# Patient Record
Sex: Female | Born: 1955
Health system: Southern US, Community
[De-identification: ages and names within clinical notes are randomized; demographics above are authoritative.]

## PROBLEM LIST (undated history)

## (undated) DIAGNOSIS — G8929 Other chronic pain: Secondary | ICD-10-CM

## (undated) DIAGNOSIS — E785 Hyperlipidemia, unspecified: Secondary | ICD-10-CM

## (undated) DIAGNOSIS — Z8632 Personal history of gestational diabetes: Secondary | ICD-10-CM

## (undated) DIAGNOSIS — I10 Essential (primary) hypertension: Secondary | ICD-10-CM

## (undated) DIAGNOSIS — M79606 Pain in leg, unspecified: Secondary | ICD-10-CM

## (undated) DIAGNOSIS — M5430 Sciatica, unspecified side: Secondary | ICD-10-CM

## (undated) DIAGNOSIS — F32A Depression, unspecified: Secondary | ICD-10-CM

## (undated) DIAGNOSIS — M109 Gout, unspecified: Secondary | ICD-10-CM

## (undated) DIAGNOSIS — G4733 Obstructive sleep apnea (adult) (pediatric): Secondary | ICD-10-CM

## (undated) DIAGNOSIS — M1990 Unspecified osteoarthritis, unspecified site: Secondary | ICD-10-CM

## (undated) DIAGNOSIS — R51 Headache: Secondary | ICD-10-CM

## (undated) DIAGNOSIS — H409 Unspecified glaucoma: Secondary | ICD-10-CM

## (undated) DIAGNOSIS — M549 Dorsalgia, unspecified: Secondary | ICD-10-CM

## (undated) DIAGNOSIS — R519 Headache, unspecified: Secondary | ICD-10-CM

## (undated) DIAGNOSIS — F419 Anxiety disorder, unspecified: Secondary | ICD-10-CM

## (undated) DIAGNOSIS — S86019A Strain of unspecified Achilles tendon, initial encounter: Secondary | ICD-10-CM

## (undated) DIAGNOSIS — F329 Major depressive disorder, single episode, unspecified: Secondary | ICD-10-CM

## (undated) HISTORY — PX: BACK SURGERY: SHX140

## (undated) HISTORY — DX: Obstructive sleep apnea (adult) (pediatric): G47.33

## (undated) HISTORY — DX: Depression, unspecified: F32.A

## (undated) HISTORY — DX: Hyperlipidemia, unspecified: E78.5

## (undated) HISTORY — PX: TUBAL LIGATION: SHX77

## (undated) HISTORY — DX: Anxiety disorder, unspecified: F41.9

## (undated) HISTORY — PX: CERVICAL SPINE SURGERY: SHX589

## (undated) HISTORY — PX: BREAST EXCISIONAL BIOPSY: SUR124

## (undated) HISTORY — DX: Major depressive disorder, single episode, unspecified: F32.9

## (undated) HISTORY — DX: Essential (primary) hypertension: I10

## (undated) HISTORY — PX: LUMBAR SPINE SURGERY: SHX701

## (undated) HISTORY — DX: Unspecified glaucoma: H40.9

## (undated) HISTORY — DX: Unspecified osteoarthritis, unspecified site: M19.90

## (undated) HISTORY — DX: Personal history of gestational diabetes: Z86.32

---

## 1989-09-18 HISTORY — PX: CHOLECYSTECTOMY: SHX55

## 2002-11-28 ENCOUNTER — Encounter: Payer: Self-pay | Admitting: Neurosurgery

## 2002-11-28 ENCOUNTER — Inpatient Hospital Stay (HOSPITAL_COMMUNITY): Admission: EM | Admit: 2002-11-28 | Discharge: 2002-12-02 | Payer: Self-pay | Admitting: Emergency Medicine

## 2002-12-01 ENCOUNTER — Encounter: Payer: Self-pay | Admitting: Neurosurgery

## 2003-01-01 ENCOUNTER — Encounter: Admission: RE | Admit: 2003-01-01 | Discharge: 2003-04-01 | Payer: Self-pay | Admitting: Neurosurgery

## 2004-02-18 ENCOUNTER — Other Ambulatory Visit: Payer: Self-pay

## 2005-01-12 ENCOUNTER — Encounter (INDEPENDENT_AMBULATORY_CARE_PROVIDER_SITE_OTHER): Payer: Self-pay | Admitting: Internal Medicine

## 2005-01-31 ENCOUNTER — Ambulatory Visit: Payer: Self-pay

## 2005-03-09 ENCOUNTER — Ambulatory Visit: Payer: Self-pay

## 2005-06-15 ENCOUNTER — Other Ambulatory Visit: Payer: Self-pay

## 2005-06-16 ENCOUNTER — Inpatient Hospital Stay: Payer: Self-pay | Admitting: Family Medicine

## 2005-09-15 ENCOUNTER — Ambulatory Visit: Payer: Self-pay | Admitting: Family Medicine

## 2007-01-22 ENCOUNTER — Emergency Department: Payer: Self-pay | Admitting: Emergency Medicine

## 2007-01-22 ENCOUNTER — Other Ambulatory Visit: Payer: Self-pay

## 2007-03-07 ENCOUNTER — Ambulatory Visit: Payer: Self-pay | Admitting: Internal Medicine

## 2007-03-26 ENCOUNTER — Encounter (INDEPENDENT_AMBULATORY_CARE_PROVIDER_SITE_OTHER): Payer: Self-pay | Admitting: Internal Medicine

## 2007-03-26 ENCOUNTER — Ambulatory Visit: Payer: Self-pay | Admitting: Internal Medicine

## 2007-11-14 ENCOUNTER — Emergency Department: Payer: Self-pay | Admitting: Emergency Medicine

## 2008-03-11 ENCOUNTER — Ambulatory Visit: Payer: Self-pay | Admitting: Internal Medicine

## 2008-07-16 ENCOUNTER — Ambulatory Visit: Payer: Self-pay | Admitting: Gastroenterology

## 2008-07-16 DIAGNOSIS — K5730 Diverticulosis of large intestine without perforation or abscess without bleeding: Secondary | ICD-10-CM | POA: Insufficient documentation

## 2008-07-16 DIAGNOSIS — K648 Other hemorrhoids: Secondary | ICD-10-CM | POA: Insufficient documentation

## 2008-09-17 ENCOUNTER — Ambulatory Visit: Payer: Self-pay | Admitting: Internal Medicine

## 2008-09-29 ENCOUNTER — Ambulatory Visit: Payer: Self-pay | Admitting: Cardiology

## 2008-09-29 ENCOUNTER — Inpatient Hospital Stay (HOSPITAL_COMMUNITY): Admission: EM | Admit: 2008-09-29 | Discharge: 2008-10-01 | Payer: Self-pay | Admitting: Emergency Medicine

## 2008-09-30 ENCOUNTER — Encounter (INDEPENDENT_AMBULATORY_CARE_PROVIDER_SITE_OTHER): Payer: Self-pay | Admitting: *Deleted

## 2008-10-09 ENCOUNTER — Ambulatory Visit: Payer: Self-pay | Admitting: Internal Medicine

## 2008-12-10 ENCOUNTER — Encounter: Admission: RE | Admit: 2008-12-10 | Discharge: 2009-03-10 | Payer: Self-pay | Admitting: Internal Medicine

## 2009-04-20 ENCOUNTER — Emergency Department (HOSPITAL_COMMUNITY): Admission: EM | Admit: 2009-04-20 | Discharge: 2009-04-20 | Payer: Self-pay | Admitting: Emergency Medicine

## 2009-04-28 ENCOUNTER — Emergency Department (HOSPITAL_COMMUNITY): Admission: EM | Admit: 2009-04-28 | Discharge: 2009-04-28 | Payer: Self-pay | Admitting: Family Medicine

## 2009-04-30 ENCOUNTER — Emergency Department (HOSPITAL_COMMUNITY): Admission: EM | Admit: 2009-04-30 | Discharge: 2009-04-30 | Payer: Self-pay | Admitting: Family Medicine

## 2009-05-13 ENCOUNTER — Encounter (INDEPENDENT_AMBULATORY_CARE_PROVIDER_SITE_OTHER): Payer: Self-pay | Admitting: Internal Medicine

## 2009-06-10 ENCOUNTER — Telehealth (INDEPENDENT_AMBULATORY_CARE_PROVIDER_SITE_OTHER): Payer: Self-pay | Admitting: Internal Medicine

## 2009-06-17 ENCOUNTER — Encounter (INDEPENDENT_AMBULATORY_CARE_PROVIDER_SITE_OTHER): Payer: Self-pay | Admitting: Internal Medicine

## 2009-07-02 ENCOUNTER — Ambulatory Visit: Payer: Self-pay | Admitting: Internal Medicine

## 2009-07-02 DIAGNOSIS — M545 Low back pain: Secondary | ICD-10-CM | POA: Insufficient documentation

## 2009-07-02 DIAGNOSIS — I10 Essential (primary) hypertension: Secondary | ICD-10-CM | POA: Insufficient documentation

## 2009-07-02 DIAGNOSIS — H409 Unspecified glaucoma: Secondary | ICD-10-CM | POA: Insufficient documentation

## 2009-07-02 DIAGNOSIS — G4733 Obstructive sleep apnea (adult) (pediatric): Secondary | ICD-10-CM | POA: Insufficient documentation

## 2009-07-02 DIAGNOSIS — E119 Type 2 diabetes mellitus without complications: Secondary | ICD-10-CM

## 2009-07-02 DIAGNOSIS — E1122 Type 2 diabetes mellitus with diabetic chronic kidney disease: Secondary | ICD-10-CM | POA: Insufficient documentation

## 2009-07-02 DIAGNOSIS — Z6841 Body Mass Index (BMI) 40.0 and over, adult: Secondary | ICD-10-CM | POA: Insufficient documentation

## 2009-07-02 DIAGNOSIS — R109 Unspecified abdominal pain: Secondary | ICD-10-CM | POA: Insufficient documentation

## 2009-07-02 DIAGNOSIS — M549 Dorsalgia, unspecified: Secondary | ICD-10-CM | POA: Insufficient documentation

## 2009-07-02 DIAGNOSIS — R609 Edema, unspecified: Secondary | ICD-10-CM | POA: Insufficient documentation

## 2009-07-02 DIAGNOSIS — Z87891 Personal history of nicotine dependence: Secondary | ICD-10-CM | POA: Insufficient documentation

## 2009-07-02 DIAGNOSIS — J45909 Unspecified asthma, uncomplicated: Secondary | ICD-10-CM | POA: Insufficient documentation

## 2009-07-02 DIAGNOSIS — E785 Hyperlipidemia, unspecified: Secondary | ICD-10-CM | POA: Insufficient documentation

## 2009-07-02 HISTORY — DX: Obstructive sleep apnea (adult) (pediatric): G47.33

## 2009-07-02 LAB — CONVERTED CEMR LAB: Blood Glucose, Fingerstick: 331

## 2009-07-07 ENCOUNTER — Encounter (INDEPENDENT_AMBULATORY_CARE_PROVIDER_SITE_OTHER): Payer: Self-pay | Admitting: Internal Medicine

## 2009-07-09 ENCOUNTER — Encounter (INDEPENDENT_AMBULATORY_CARE_PROVIDER_SITE_OTHER): Payer: Self-pay | Admitting: Internal Medicine

## 2009-07-12 DIAGNOSIS — D509 Iron deficiency anemia, unspecified: Secondary | ICD-10-CM | POA: Insufficient documentation

## 2009-07-12 LAB — CONVERTED CEMR LAB
ALT: 9 units/L (ref 0–35)
AST: 10 units/L (ref 0–37)
Alkaline Phosphatase: 66 units/L (ref 39–117)
Basophils Absolute: 0 10*3/uL (ref 0.0–0.1)
Basophils Relative: 0 % (ref 0–1)
CO2: 23 meq/L (ref 19–32)
Creatinine, Ser: 1.04 mg/dL (ref 0.40–1.20)
Eosinophils Absolute: 0.2 10*3/uL (ref 0.0–0.7)
MCHC: 31.2 g/dL (ref 30.0–36.0)
MCV: 80.5 fL (ref 78.0–100.0)
Neutro Abs: 3.9 10*3/uL (ref 1.7–7.7)
Neutrophils Relative %: 58 % (ref 43–77)
Platelets: 183 10*3/uL (ref 150–400)
RDW: 16.2 % — ABNORMAL HIGH (ref 11.5–15.5)
Sodium: 140 meq/L (ref 135–145)
TSH: 3.017 microintl units/mL (ref 0.350–4.500)
Total Bilirubin: 0.3 mg/dL (ref 0.3–1.2)

## 2009-07-13 ENCOUNTER — Encounter (INDEPENDENT_AMBULATORY_CARE_PROVIDER_SITE_OTHER): Payer: Self-pay | Admitting: Internal Medicine

## 2009-07-15 ENCOUNTER — Telehealth (INDEPENDENT_AMBULATORY_CARE_PROVIDER_SITE_OTHER): Payer: Self-pay | Admitting: Internal Medicine

## 2009-07-26 ENCOUNTER — Encounter (INDEPENDENT_AMBULATORY_CARE_PROVIDER_SITE_OTHER): Payer: Self-pay | Admitting: Internal Medicine

## 2009-08-11 ENCOUNTER — Telehealth (INDEPENDENT_AMBULATORY_CARE_PROVIDER_SITE_OTHER): Payer: Self-pay | Admitting: Internal Medicine

## 2009-08-13 ENCOUNTER — Ambulatory Visit (HOSPITAL_BASED_OUTPATIENT_CLINIC_OR_DEPARTMENT_OTHER): Admission: RE | Admit: 2009-08-13 | Discharge: 2009-08-13 | Payer: Self-pay | Admitting: Internal Medicine

## 2009-08-13 ENCOUNTER — Emergency Department (HOSPITAL_COMMUNITY): Admission: EM | Admit: 2009-08-13 | Discharge: 2009-08-13 | Payer: Self-pay | Admitting: Emergency Medicine

## 2009-08-13 ENCOUNTER — Encounter (INDEPENDENT_AMBULATORY_CARE_PROVIDER_SITE_OTHER): Payer: Self-pay | Admitting: Internal Medicine

## 2009-08-17 ENCOUNTER — Encounter (INDEPENDENT_AMBULATORY_CARE_PROVIDER_SITE_OTHER): Payer: Self-pay | Admitting: Internal Medicine

## 2009-08-17 ENCOUNTER — Encounter: Admission: RE | Admit: 2009-08-17 | Discharge: 2009-09-15 | Payer: Self-pay | Admitting: Internal Medicine

## 2009-08-20 ENCOUNTER — Ambulatory Visit: Payer: Self-pay | Admitting: Internal Medicine

## 2009-08-20 LAB — CONVERTED CEMR LAB
Bilirubin Urine: NEGATIVE
Glucose, Urine, Semiquant: NEGATIVE
Hgb A1c MFr Bld: 10.2 %
Ketones, urine, test strip: NEGATIVE

## 2009-08-28 ENCOUNTER — Ambulatory Visit: Payer: Self-pay | Admitting: Internal Medicine

## 2009-08-31 ENCOUNTER — Encounter (INDEPENDENT_AMBULATORY_CARE_PROVIDER_SITE_OTHER): Payer: Self-pay | Admitting: Internal Medicine

## 2009-09-02 ENCOUNTER — Telehealth (INDEPENDENT_AMBULATORY_CARE_PROVIDER_SITE_OTHER): Payer: Self-pay | Admitting: Internal Medicine

## 2009-09-06 LAB — CONVERTED CEMR LAB
Iron: 36 ug/dL — ABNORMAL LOW (ref 42–145)
RBC Folate: 378 ng/mL (ref 180–600)
Saturation Ratios: 14 % — ABNORMAL LOW (ref 20–55)
Total CHOL/HDL Ratio: 2.5
UIBC: 213 ug/dL
VLDL: 22 mg/dL (ref 0–40)
Vitamin B-12: 644 pg/mL (ref 211–911)

## 2009-09-13 ENCOUNTER — Encounter (INDEPENDENT_AMBULATORY_CARE_PROVIDER_SITE_OTHER): Payer: Self-pay | Admitting: *Deleted

## 2009-09-16 ENCOUNTER — Telehealth (INDEPENDENT_AMBULATORY_CARE_PROVIDER_SITE_OTHER): Payer: Self-pay | Admitting: Internal Medicine

## 2009-10-06 ENCOUNTER — Ambulatory Visit (HOSPITAL_COMMUNITY): Admission: RE | Admit: 2009-10-06 | Discharge: 2009-10-06 | Payer: Self-pay | Admitting: Internal Medicine

## 2009-10-08 ENCOUNTER — Ambulatory Visit: Payer: Self-pay | Admitting: Internal Medicine

## 2009-10-08 DIAGNOSIS — N644 Mastodynia: Secondary | ICD-10-CM | POA: Insufficient documentation

## 2009-10-28 ENCOUNTER — Telehealth (INDEPENDENT_AMBULATORY_CARE_PROVIDER_SITE_OTHER): Payer: Self-pay | Admitting: Internal Medicine

## 2009-11-08 ENCOUNTER — Encounter (INDEPENDENT_AMBULATORY_CARE_PROVIDER_SITE_OTHER): Payer: Self-pay | Admitting: Internal Medicine

## 2009-11-08 ENCOUNTER — Ambulatory Visit (HOSPITAL_BASED_OUTPATIENT_CLINIC_OR_DEPARTMENT_OTHER): Admission: RE | Admit: 2009-11-08 | Discharge: 2009-11-08 | Payer: Self-pay | Admitting: Internal Medicine

## 2009-11-14 ENCOUNTER — Ambulatory Visit: Payer: Self-pay | Admitting: Internal Medicine

## 2009-12-04 ENCOUNTER — Telehealth (INDEPENDENT_AMBULATORY_CARE_PROVIDER_SITE_OTHER): Payer: Self-pay | Admitting: Internal Medicine

## 2009-12-07 ENCOUNTER — Encounter (INDEPENDENT_AMBULATORY_CARE_PROVIDER_SITE_OTHER): Payer: Self-pay | Admitting: Internal Medicine

## 2009-12-13 ENCOUNTER — Encounter (INDEPENDENT_AMBULATORY_CARE_PROVIDER_SITE_OTHER): Payer: Self-pay | Admitting: Internal Medicine

## 2009-12-24 ENCOUNTER — Ambulatory Visit: Payer: Self-pay | Admitting: Internal Medicine

## 2010-01-31 ENCOUNTER — Encounter (INDEPENDENT_AMBULATORY_CARE_PROVIDER_SITE_OTHER): Payer: Self-pay | Admitting: Internal Medicine

## 2010-01-31 ENCOUNTER — Encounter: Admission: RE | Admit: 2010-01-31 | Discharge: 2010-01-31 | Payer: Self-pay | Admitting: Internal Medicine

## 2010-02-03 ENCOUNTER — Encounter (INDEPENDENT_AMBULATORY_CARE_PROVIDER_SITE_OTHER): Payer: Self-pay | Admitting: Internal Medicine

## 2010-03-24 ENCOUNTER — Telehealth (INDEPENDENT_AMBULATORY_CARE_PROVIDER_SITE_OTHER): Payer: Self-pay | Admitting: Internal Medicine

## 2010-03-26 ENCOUNTER — Encounter (INDEPENDENT_AMBULATORY_CARE_PROVIDER_SITE_OTHER): Payer: Self-pay | Admitting: Internal Medicine

## 2010-03-26 DIAGNOSIS — J449 Chronic obstructive pulmonary disease, unspecified: Secondary | ICD-10-CM | POA: Insufficient documentation

## 2010-03-26 DIAGNOSIS — J4489 Other specified chronic obstructive pulmonary disease: Secondary | ICD-10-CM | POA: Insufficient documentation

## 2010-03-26 HISTORY — DX: Chronic obstructive pulmonary disease, unspecified: J44.9

## 2010-03-26 HISTORY — DX: Other specified chronic obstructive pulmonary disease: J44.89

## 2010-04-04 ENCOUNTER — Encounter (INDEPENDENT_AMBULATORY_CARE_PROVIDER_SITE_OTHER): Payer: Self-pay | Admitting: Internal Medicine

## 2010-04-26 ENCOUNTER — Ambulatory Visit: Payer: Self-pay | Admitting: Internal Medicine

## 2010-04-26 ENCOUNTER — Telehealth (INDEPENDENT_AMBULATORY_CARE_PROVIDER_SITE_OTHER): Payer: Self-pay | Admitting: Internal Medicine

## 2010-04-26 ENCOUNTER — Other Ambulatory Visit: Admission: RE | Admit: 2010-04-26 | Discharge: 2010-04-26 | Payer: Self-pay | Admitting: Internal Medicine

## 2010-04-26 DIAGNOSIS — F329 Major depressive disorder, single episode, unspecified: Secondary | ICD-10-CM | POA: Insufficient documentation

## 2010-04-26 DIAGNOSIS — R079 Chest pain, unspecified: Secondary | ICD-10-CM | POA: Insufficient documentation

## 2010-04-26 DIAGNOSIS — M5030 Other cervical disc degeneration, unspecified cervical region: Secondary | ICD-10-CM | POA: Insufficient documentation

## 2010-04-26 DIAGNOSIS — M26609 Unspecified temporomandibular joint disorder, unspecified side: Secondary | ICD-10-CM | POA: Insufficient documentation

## 2010-05-02 ENCOUNTER — Ambulatory Visit: Payer: Self-pay | Admitting: Internal Medicine

## 2010-05-03 ENCOUNTER — Encounter (INDEPENDENT_AMBULATORY_CARE_PROVIDER_SITE_OTHER): Payer: Self-pay | Admitting: Internal Medicine

## 2010-05-05 ENCOUNTER — Encounter (INDEPENDENT_AMBULATORY_CARE_PROVIDER_SITE_OTHER): Payer: Self-pay | Admitting: Internal Medicine

## 2010-05-05 ENCOUNTER — Telehealth (INDEPENDENT_AMBULATORY_CARE_PROVIDER_SITE_OTHER): Payer: Self-pay | Admitting: Internal Medicine

## 2010-05-05 DIAGNOSIS — D649 Anemia, unspecified: Secondary | ICD-10-CM | POA: Insufficient documentation

## 2010-05-05 LAB — CONVERTED CEMR LAB
Albumin: 3.9 g/dL (ref 3.5–5.2)
Alkaline Phosphatase: 60 units/L (ref 39–117)
BUN: 15 mg/dL (ref 6–23)
Creatinine, Ser: 0.98 mg/dL (ref 0.40–1.20)
Eosinophils Absolute: 0.2 10*3/uL (ref 0.0–0.7)
Eosinophils Relative: 3 % (ref 0–5)
Glucose, Bld: 139 mg/dL — ABNORMAL HIGH (ref 70–99)
HCT: 37.3 % (ref 36.0–46.0)
HDL: 59 mg/dL (ref >39)
LDL Cholesterol: 120 mg/dL — ABNORMAL HIGH (ref 0–99)
Lymphs Abs: 1.9 10*3/uL (ref 0.7–4.0)
MCV: 84.8 fL (ref 78.0–100.0)
Monocytes Absolute: 0.4 10*3/uL (ref 0.1–1.0)
Monocytes Relative: 8 % (ref 3–12)
RBC: 4.4 M/uL (ref 3.87–5.11)
Total Bilirubin: 0.3 mg/dL (ref 0.3–1.2)
Triglycerides: 88 mg/dL (ref <150)
VLDL: 18 mg/dL (ref 0–40)
WBC: 5.6 10*3/uL (ref 4.0–10.5)

## 2010-05-09 ENCOUNTER — Telehealth (INDEPENDENT_AMBULATORY_CARE_PROVIDER_SITE_OTHER): Payer: Self-pay | Admitting: *Deleted

## 2010-05-09 ENCOUNTER — Ambulatory Visit: Payer: Self-pay | Admitting: Internal Medicine

## 2010-05-09 DIAGNOSIS — N950 Postmenopausal bleeding: Secondary | ICD-10-CM | POA: Insufficient documentation

## 2010-05-09 LAB — CONVERTED CEMR LAB
Iron: 24 ug/dL — ABNORMAL LOW (ref 42–145)
Saturation Ratios: 8 % — ABNORMAL LOW (ref 20–55)
Vitamin B-12: 592 pg/mL (ref 211–911)

## 2010-05-11 ENCOUNTER — Encounter (INDEPENDENT_AMBULATORY_CARE_PROVIDER_SITE_OTHER): Payer: Self-pay | Admitting: Internal Medicine

## 2010-05-13 ENCOUNTER — Ambulatory Visit: Payer: Self-pay | Admitting: Internal Medicine

## 2010-05-13 ENCOUNTER — Ambulatory Visit (HOSPITAL_COMMUNITY): Admission: RE | Admit: 2010-05-13 | Discharge: 2010-05-13 | Payer: Self-pay | Admitting: Internal Medicine

## 2010-05-16 ENCOUNTER — Ambulatory Visit: Payer: Self-pay | Admitting: Internal Medicine

## 2010-05-24 ENCOUNTER — Encounter (INDEPENDENT_AMBULATORY_CARE_PROVIDER_SITE_OTHER): Payer: Self-pay | Admitting: Internal Medicine

## 2010-06-09 ENCOUNTER — Emergency Department (HOSPITAL_COMMUNITY): Admission: EM | Admit: 2010-06-09 | Discharge: 2010-06-09 | Payer: Self-pay | Admitting: Emergency Medicine

## 2010-06-17 ENCOUNTER — Encounter (INDEPENDENT_AMBULATORY_CARE_PROVIDER_SITE_OTHER): Payer: Self-pay | Admitting: Internal Medicine

## 2010-06-20 ENCOUNTER — Encounter (INDEPENDENT_AMBULATORY_CARE_PROVIDER_SITE_OTHER): Payer: Self-pay | Admitting: Internal Medicine

## 2010-06-20 ENCOUNTER — Encounter
Admission: RE | Admit: 2010-06-20 | Discharge: 2010-09-18 | Payer: Self-pay | Source: Home / Self Care | Attending: Internal Medicine | Admitting: Internal Medicine

## 2010-06-21 ENCOUNTER — Telehealth (INDEPENDENT_AMBULATORY_CARE_PROVIDER_SITE_OTHER): Payer: Self-pay | Admitting: Internal Medicine

## 2010-06-21 ENCOUNTER — Encounter (INDEPENDENT_AMBULATORY_CARE_PROVIDER_SITE_OTHER): Payer: Self-pay | Admitting: Internal Medicine

## 2010-06-23 ENCOUNTER — Ambulatory Visit: Payer: Self-pay | Admitting: Obstetrics and Gynecology

## 2010-06-23 ENCOUNTER — Other Ambulatory Visit: Admission: RE | Admit: 2010-06-23 | Discharge: 2010-06-23 | Payer: Self-pay | Admitting: Obstetrics and Gynecology

## 2010-07-14 ENCOUNTER — Ambulatory Visit: Payer: Self-pay | Admitting: Obstetrics & Gynecology

## 2010-07-29 ENCOUNTER — Encounter (INDEPENDENT_AMBULATORY_CARE_PROVIDER_SITE_OTHER): Payer: Self-pay | Admitting: Internal Medicine

## 2010-08-26 ENCOUNTER — Ambulatory Visit: Payer: Self-pay | Admitting: Internal Medicine

## 2010-08-30 ENCOUNTER — Ambulatory Visit (HOSPITAL_COMMUNITY)
Admission: RE | Admit: 2010-08-30 | Discharge: 2010-08-30 | Payer: Self-pay | Source: Home / Self Care | Attending: Obstetrics and Gynecology | Admitting: Obstetrics and Gynecology

## 2010-10-06 ENCOUNTER — Ambulatory Visit
Admission: RE | Admit: 2010-10-06 | Discharge: 2010-10-06 | Payer: Self-pay | Source: Home / Self Care | Attending: Obstetrics and Gynecology | Admitting: Obstetrics and Gynecology

## 2010-10-10 ENCOUNTER — Ambulatory Visit (HOSPITAL_COMMUNITY): Admission: RE | Admit: 2010-10-10 | Payer: Self-pay | Source: Home / Self Care | Admitting: Internal Medicine

## 2010-10-18 NOTE — Progress Notes (Signed)
Summary: Office Visit//DEPRESSION SCREENING  Office Visit//DEPRESSION SCREENING   Imported By: Arta Bruce 05/04/2010 15:37:08  _____________________________________________________________________  External Attachment:    Type:   Image     Comment:   External Document

## 2010-10-18 NOTE — Letter (Signed)
Summary: NUTRITION & DIABETES  NUTRITION & DIABETES   Imported By: Arta Bruce 04/12/2010 12:27:50  _____________________________________________________________________  External Attachment:    Type:   Image     Comment:   External Document

## 2010-10-18 NOTE — Letter (Signed)
Summary: NUTRITION & DIABETES/PROGRESS NOTE  NUTRITION & DIABETES/PROGRESS NOTE   Imported By: Arta Bruce 02/10/2010 12:07:48  _____________________________________________________________________  External Attachment:    Type:   Image     Comment:   External Document

## 2010-10-18 NOTE — Letter (Signed)
Summary: REFERRAL//NUTRITION  REFERRAL//NUTRITION   Imported By: Arta Bruce 11/03/2009 14:56:50  _____________________________________________________________________  External Attachment:    Type:   Image     Comment:   External Document

## 2010-10-18 NOTE — Progress Notes (Signed)
  Phone Note Outgoing Call   Summary of Call: Rec'd note from [facility] for need to change Lumigan eye gtts from 0.03% to 0.01% due to supply. Please check to see if patient has an ophthalmologist.  If so, ask her eye doctor to recommend a substitute or if 0.01% is ok. Will leave refill paper on Dr. Renne Crigler desk.  Initial call taken by: Tereso Newcomer PA-C,  March 24, 2010 1:25 PM  Follow-up for Phone Call        Not sure why she was on the higher dose--literature suggests that is for eyelash growth--okay to fill the lower percentage medication--please let the pt. know and see if there is an eye doctor she goes to that we can notify. Follow-up by: Julieanne Manson MD,  March 25, 2010 9:08 AM  Additional Follow-up for Phone Call Additional follow up Details #1::        Pt aware and she goes to Dr. Dione Booze Additional Follow-up by: Vesta Mixer CMA,  March 30, 2010 11:15 AM    Additional Follow-up for Phone Call Additional follow up Details #2::    Please call his office and find out what dose he wants her on--let them know we do not have the higher strength Follow-up by: Julieanne Manson MD,  April 01, 2010 6:15 PM  Additional Follow-up for Phone Call Additional follow up Details #3:: Details for Additional Follow-up Action Taken: Dr. Lucious Groves office aware and states that is fine. Additional Follow-up by: Vesta Mixer CMA,  April 06, 2010 10:35 AM  New/Updated Medications: LUMIGAN 0.01 % SOLN (BIMATOPROST) 1 drop each eye at bedtime Prescriptions: LUMIGAN 0.01 % SOLN (BIMATOPROST) 1 drop each eye at bedtime  #1 month x 11   Entered and Authorized by:   Julieanne Manson MD   Signed by:   Julieanne Manson MD on 03/25/2010   Method used:   Re-Faxed to ...       [facility] (retail)       [address] [place], [place]  [address]       Ph: [phone]       Fax: [phone]   RxID:   [id number] LUMIGAN 0.01 % SOLN (BIMATOPROST) 1 drop each eye  at bedtime  #1 month x 11   Entered and Authorized by:   Julieanne Manson MD   Signed by:   Julieanne Manson MD on 03/25/2010   Method used:   Electronically to        [facility] [address] (retail)       [address]       [place], [place]  [address]       Ph: [phone] or [phone]       Fax: [phone]   RxID:   [id number]  Needed to go to [facility]

## 2010-10-18 NOTE — Letter (Signed)
Summary: RECORDS RECEIVED FROM [facility]  RECORDS RECEIVED FROM [facility]   Imported By: Arta Bruce 04/12/2010 12:37:01  _____________________________________________________________________  External Attachment:    Type:   Image     Comment:   External Document

## 2010-10-18 NOTE — Assessment & Plan Note (Signed)
Summary: breast pain /tmm   Vital Signs:  Patient profile:   55 year old female Menstrual status:  postmenopausal Weight:      249.9 pounds Temp:     97.3 degrees F  oral Pulse rate:   70 / minute Pulse rhythm:   regular Resp:     21 per minute BP sitting:   131 / 73  (left arm) Cuff size:   large  Vitals Entered By: Geanie Cooley  (October 08, 2009 12:03 PM) CC:  pt here for breast pain, pt states the pain is worse in the left breast than the right. The right breast doesnt hurt as much. Pt states that sometimes the pain is constant and sometimes it comes and go. Pt is concern with her blood sugar, shes taking all her medicine, but feels her sugar is too high. Pt states that if she touches it or squeeze it hurts, it just depends on how she moves. Is Patient Diabetic? Yes Did you bring your meter with you today? No Pain Assessment Patient in pain? yes     Location: breast Intensity: 4 Type: sharp CBG Result 136  Does patient need assistance? Functional Status Self care Ambulation Normal   CC:   pt here for breast pain, pt states the pain is worse in the left breast than the right. The right breast doesnt hurt as much. Pt states that sometimes the pain is constant and sometimes it comes and go. Pt is concern with her blood sugar, shes taking all her medicine, but feels her sugar is too high. Pt states that if she touches it or squeeze it hurts, and it just depends on how she moves..  History of Present Illness: 1.  Bilateral breast pain:  several months--Left more so than right.  Points to upper left breast near axilla.  When lifts breasts up from underneat as taking off bra--hurt really bad, much worse after mammogram.  No mass or nipple discharge.  Not sure if any injury to area in past.  2.  DM:  sugars still high, even when not eating.  Only taking 500 mg two times a day of Metformin  Allergies (verified): No Known Drug Allergies  Physical Exam  Breasts:  Large,  pendulous breasts.  Thickening at superior base of left breast--across entire breast--quite tender.  No focal mass, skin dimpling, nipple discharge or axillary adenopathy   Impression & Recommendations:  Problem # 1:  MASTALGIA (ICD-611.71) Mammogram needs to be compared to [facility] will get back to [place] with reading Ibuprofen for pain  Problem # 2:  SLEEP APNEA, OBSTRUCTIVE (ICD-327.23)  Needs CPAP titration per sleep study  Orders: Sleep Disorder Referral (Sleep Disorder)  Problem # 3:  DIABETES MELLITUS, TYPE II, ON INSULIN (ICD-250.00) Increase Metformin to 1000 mg two times a day with meals Her updated medication list for this problem includes:    Metformin Hcl 500 Mg Tabs (Metformin hcl) .Marland Kitchen... 2 tabs by mouth two times a day with meals    Lantus Solostar 100 Unit/ml Soln (Insulin glargine) .Marland KitchenMarland KitchenMarland KitchenMarland Kitchen 40 units subcutaneously at bedtime    Novolog 100 Unit/ml Soln (Insulin aspart) ..... Sliding scale    Micardis Hct 80-25 Mg Tabs (Telmisartan-hctz) .Marland Kitchen... 1 tab by mouth daily    Accupril 40 Mg Tabs (Quinapril hcl) .Marland Kitchen... 1 tab by mouth daily    Adult Aspirin Ec Low Strength 81 Mg Tbec (Aspirin) .Marland Kitchen... 1 tab by mouth daily with meal    Glyburide 5 Mg Tabs (Glyburide) .Marland KitchenMarland KitchenMarland KitchenMarland Kitchen  1 tab by mouth two times a day with meals  Orders: Capillary Blood Glucose/CBG (91478)  Complete Medication List: 1)  Metformin Hcl 500 Mg Tabs (Metformin hcl) .... 2 tabs by mouth two times a day with meals 2)  Lantus Solostar 100 Unit/ml Soln (Insulin glargine) .... 40 units subcutaneously at bedtime 3)  Novolog 100 Unit/ml Soln (Insulin aspart) .... Sliding scale 4)  Micardis Hct 80-25 Mg Tabs (Telmisartan-hctz) .Marland Kitchen.. 1 tab by mouth daily 5)  Lipitor 20 Mg Tabs (Atorvastatin calcium) .Marland Kitchen.. 1 tab by mouth daily 6)  Xyzal 5 Mg Tabs (Levocetirizine dihydrochloride) .Marland Kitchen.. 1 tab by mouth daily as needed allergies 7)  Furosemide 20 Mg Tabs (Furosemide) .... 1/2 to 1 tab by mouth in morning 8)  Accupril 40 Mg  Tabs (Quinapril hcl) .Marland Kitchen.. 1 tab by mouth daily 9)  Ventolin Hfa 108 (90 Base) Mcg/act Aers (Albuterol sulfate) .... 2 puffs every 4 hours as needed for wheezing. 10)  Alphagan P 0.1 % Soln (Brimonidine tartrate) .Marland Kitchen.. 1 drop each eye two times a day 11)  Azopt 1 % Susp (Brinzolamide) .Marland Kitchen.. 1 drop each eye two times a day 12)  Lumigan 0.03 % Soln (Bimatoprost) .Marland Kitchen.. 1 drop each eye at bedtime 13)  Adult Aspirin Ec Low Strength 81 Mg Tbec (Aspirin) .Marland Kitchen.. 1 tab by mouth daily with meal 14)  Lancets Misc (Lancets) .... Use for three times a day testing 15)  Glucose Monitor Test Strips  .... Test blood sugar three times a day 16)  Ferrous Sulfate 325 (65 Fe) Mg Tabs (Ferrous sulfate) .Marland Kitchen.. 1 tab by mouth daily 17)  Glyburide 5 Mg Tabs (Glyburide) .Marland Kitchen.. 1 tab by mouth two times a day with meals  Patient Instructions: 1)  Ibuprofen 400-800 mg every 6 hours as needed for breast pain. 2)  Warm compress to painful area as needed  Prescriptions: METFORMIN HCL 500 MG TABS (METFORMIN HCL) 2 tabs by mouth two times a day with meals  #120 x 11   Entered and Authorized by:   Julieanne Manson MD   Signed by:   Julieanne Manson MD on 10/08/2009   Method used:   Print then Give to Patient   RxID:   [id number]    Vital Signs:  Patient profile:   55 year old female Menstrual status:  postmenopausal Weight:      249.9 pounds Temp:     97.3 degrees F  oral Pulse rate:   70 / minute Pulse rhythm:   regular Resp:     21 per minute BP sitting:   131 / 73  (left arm) Cuff size:   large  Vitals Entered By: Geanie Cooley  (October 08, 2009 12:03 PM)

## 2010-10-18 NOTE — Progress Notes (Signed)
  Phone Note Outgoing Call   Summary of Call: Debra:  referral to [facility] for CPAP set up--order done.  Please notify pt. when done. Initial call taken by: Julieanne Manson MD,  December 04, 2009 2:54 PM

## 2010-10-18 NOTE — Letter (Signed)
[facility]   Imported By: Arta Bruce 04/12/2010 12:31:14  _____________________________________________________________________  External Attachment:    Type:   Image     Comment:   External Document

## 2010-10-18 NOTE — Letter (Signed)
Summary: [facility]//CRAP BI LEVEL  [facility]//CRAP BI LEVEL   Imported By: Arta Bruce 12/17/2009 10:54:13  _____________________________________________________________________  External Attachment:    Type:   Image     Comment:   External Document

## 2010-10-18 NOTE — Miscellaneous (Signed)
Summary: old record update--Dr. Beverely Risen in [place]  Clinical Lists Changes  Problems: Added new problem of CHRONIC OBSTRUCTIVE PULMONARY DISEASE, MODERATE (ICD-496) - with moderate restrictive lung disease as well per PFTs 04/02/07--actual numbers not noted in old records  Appended Document: old record update--Dr. Beverely Risen in [place]    Clinical Lists Changes  Observations: Added new observation of DIAB EYE EX: [facility], [place]:  No diabetic changes (03/11/2008 13:25)      Appended Document: old record update--Dr. Beverely Risen in [place]    Clinical Lists Changes  Problems: Added new problem of DIVERTICULOSIS OF COLON (ICD-562.10) - sigmoid, descending, transverse--on colonoscopy 07/16/08 Added new problem of HEMORRHOIDS, INTERNAL (ICD-455.0) - colonoscopy Observations: Added new observation of COLONOSCOPY: Dr. Barnetta Chapel, [facility]:  3mm hyperplastic polyp, diverticulosis, internal hemorrhoids (07/16/2008 13:28)

## 2010-10-18 NOTE — Letter (Signed)
Summary: REQUEST FOR DOCUMENTATION  REQUEST FOR DOCUMENTATION   Imported By: Arta Bruce 06/23/2010 10:45:15  _____________________________________________________________________  External Attachment:    Type:   Image     Comment:   External Document

## 2010-10-18 NOTE — Letter (Signed)
Summary: *Referral Letter  [facility]  [address] [place], [place] [address]   Phone: [phone]  Fax: [phone]    05/09/2010  Thank you in advance for agreeing to see my patient:  [name] [address] [place], [place]  [address]  Phone: [phone]  Reason for Referral: Recently started some postmenopausal bleeding after 5 years without flow.  Pelvic ultrasound pending.  Procedures Requested:  Evaluation and ?endometrial biopsy  Current Medical Problems: 1)  POSTMENOPAUSAL BLEEDING (ICD-627.1) 2)  ANEMIA (ICD-285.9) 3)  CHEST PAIN (ICD-786.50) 4)  DEPRESSION (ICD-311) 5)  HEALTH SCREENING (ICD-V70.0) 6)  ROUTINE GYNECOLOGICAL EXAMINATION (ICD-V72.31) 7)  Hx of DEGENERATIVE DISC DISEASE, CERVICAL SPINE (ICD-722.4) 8)  TMJ SYNDROME (ICD-524.60) 9)  HEMORRHOIDS, INTERNAL (ICD-455.0) 10)  DIVERTICULOSIS OF COLON (ICD-562.10) 11)  CHRONIC OBSTRUCTIVE PULMONARY DISEASE, MODERATE (ICD-496) 12)  MASTALGIA (ICD-611.71) 13)  RIGHT ACHILLES PARTIAL TENDON TEAR (ICD-845.09) 14)  ANEMIA, IRON DEFICIENCY (ICD-280.9) 15)  ENCOUNTER FOR LONG-TERM USE OF OTHER MEDICATIONS (ICD-V58.69) 16)  GROIN PAIN (ICD-789.09) 17)  BACK PAIN (ICD-724.5) 18)  LOW BACK PAIN SYNDROME (ICD-724.2) 19)  GLAUCOMA (ICD-365.9) 20)  OBESITY (ICD-278.00) 21)  TOBACCO USE, QUIT (ICD-V15.82) 22)  ASTHMA (ICD-493.90) 23)  HYPERLIPIDEMIA (ICD-272.4) 24)  HYPERTENSION, ESSENTIAL (ICD-401.9) 25)  SLEEP APNEA, OBSTRUCTIVE (ICD-327.23) 26)  DIABETES MELLITUS, TYPE II, ON INSULIN (ICD-250.00) 27)  PERIPHERAL EDEMA (ICD-782.3)   Current Medications: 1)  METFORMIN HCL 1000 MG TABS (METFORMIN HCL) 1 tab by mouth two times a day with meals 2)  LANTUS SOLOSTAR 100 UNIT/ML SOLN (INSULIN GLARGINE) 40 units subcutaneously at bedtime 3)  NOVOLOG 100 UNIT/ML SOLN (INSULIN ASPART) sliding scale 4)  MICARDIS HCT 80-25 MG TABS (TELMISARTAN-HCTZ) 1 tab by mouth daily 5)  LIPITOR 20 MG TABS  (ATORVASTATIN CALCIUM) 1 tab by mouth daily 6)  XYZAL 5 MG TABS (LEVOCETIRIZINE DIHYDROCHLORIDE) 1 tab by mouth daily as needed allergies 7)  FUROSEMIDE 20 MG TABS (FUROSEMIDE) 1/2 to 1 tab by mouth in morning 8)  ACCUPRIL 40 MG TABS (QUINAPRIL HCL) 1 tab by mouth daily 9)  VENTOLIN HFA 108 (90 BASE) MCG/ACT AERS (ALBUTEROL SULFATE) 2 puffs every 4 hours as needed for wheezing. 10)  ALPHAGAN P 0.1 % SOLN (BRIMONIDINE TARTRATE) 1 drop each eye two times a day 11)  AZOPT 1 % SUSP (BRINZOLAMIDE) 1 drop each eye two times a day 12)  LUMIGAN 0.01 % SOLN (BIMATOPROST) 1 drop each eye at bedtime 13)  ADULT ASPIRIN EC LOW STRENGTH 81 MG TBEC (ASPIRIN) 1 tab by mouth daily with meal 14)  LANCETS  MISC (LANCETS) use for three times a day testing 15)  * GLUCOSE MONITOR TEST STRIPS test blood sugar three times a day 16)  GLYBURIDE 5 MG TABS (GLYBURIDE) 1 tab by mouth two times a day with meals 17)  * CPAP PILLOW Use with CPAP/Bipap 18)  FERROUS SULFATE 325 (65 FE) MG TABS (FERROUS SULFATE) 1 tab by mouth daily   Past Medical History: 1)  Hx of DEGENERATIVE DISC DISEASE, CERVICAL SPINE (ICD-722.4) 2)  TMJ SYNDROME (ICD-524.60) 3)  HEMORRHOIDS, INTERNAL (ICD-455.0) 4)  DIVERTICULOSIS OF COLON (ICD-562.10) 5)  CHRONIC OBSTRUCTIVE PULMONARY DISEASE, MODERATE (ICD-496) 6)  MASTALGIA (ICD-611.71) 7)  RIGHT ACHILLES PARTIAL TENDON TEAR (ICD-845.09) 8)  ANEMIA, IRON DEFICIENCY (ICD-280.9) 9)  ENCOUNTER FOR LONG-TERM USE OF OTHER MEDICATIONS (ICD-V58.69) 10)  GROIN PAIN (ICD-789.09) 11)  BACK PAIN (ICD-724.5) 12)  LOW BACK PAIN SYNDROME (ICD-724.2) 13)  GLAUCOMA (ICD-365.9) 14)  OBESITY (ICD-278.00) 15)  TOBACCO  USE, QUIT (ICD-V15.82) 16)  ASTHMA (ICD-493.90) 17)  HYPERLIPIDEMIA (ICD-272.4) 18)  HYPERTENSION, ESSENTIAL (ICD-401.9) 19)  SLEEP APNEA, OBSTRUCTIVE (ICD-327.23) 20)  DIABETES MELLITUS, TYPE II, ON INSULIN (ICD-250.00) 21)  PERIPHERAL EDEMA (ICD-782.3)   Prior History of Blood  Transfusions:   Pertinent Labs:    Thank you again for agreeing to see our patient; please contact us if you have any further questions or need additional information.  Sincerely,  Julieanne Manson MD

## 2010-10-18 NOTE — Assessment & Plan Note (Signed)
Summary: cpp exam//gk   Vital Signs:  Patient profile:   55 year old female Menstrual status:  postmenopausal Weight:      246 pounds Temp:     97.7 degrees F Pulse rate:   66 / minute Pulse rhythm:   regular Resp:     18 per minute BP sitting:   138 / 72  (left arm) Cuff size:   large  Vitals Entered By: Vesta Mixer CMA (April 26, 2010 9:42 AM) CC: CPP, pt not fasting, Headache Is Patient Diabetic? Yes CBG Result 276  Does patient need assistance? Ambulation Impaired:Risk for fall   CC:  CPP, pt not fasting, and Headache.  History of Present Illness: 55 yo female here for CPP.  Concerns:  1.  OSA:  Forgetting to use at night--falls asleep before.  Missing at least 2-3 times per week.  Falls asleep at the computer doing homework and then not getting to bed.  Getting in a vicious cycle.  Plans to get on a better schedule.   2.  DM:  missed nutrition appt.   STates planning to reschedule.  Discussed cancelling before appt. missed in future.  Not using Novolog ss--states when eats right and doing everything right, does not need.  Last use was 3-4 weeks ago.   Allergies (verified): No Known Drug Allergies  Past History:  Past Medical History: Hx of DEGENERATIVE DISC DISEASE, CERVICAL SPINE (ICD-722.4) TMJ SYNDROME (ICD-524.60) HEMORRHOIDS, INTERNAL (ICD-455.0) DIVERTICULOSIS OF COLON (ICD-562.10) CHRONIC OBSTRUCTIVE PULMONARY DISEASE, MODERATE (ICD-496) MASTALGIA (ICD-611.71) RIGHT ACHILLES PARTIAL TENDON TEAR (ICD-845.09) ANEMIA, IRON DEFICIENCY (ICD-280.9) ENCOUNTER FOR LONG-TERM USE OF OTHER MEDICATIONS (ICD-V58.69) GROIN PAIN (ICD-789.09) BACK PAIN (ICD-724.5) LOW BACK PAIN SYNDROME (ICD-724.2) GLAUCOMA (ICD-365.9) OBESITY (ICD-278.00) TOBACCO USE, QUIT (ICD-V15.82) ASTHMA (ICD-493.90) HYPERLIPIDEMIA (ICD-272.4) HYPERTENSION, ESSENTIAL (ICD-401.9) SLEEP APNEA, OBSTRUCTIVE (ICD-327.23) DIABETES MELLITUS, TYPE II, ON INSULIN (ICD-250.00) PERIPHERAL EDEMA  (ICD-782.3)  Past Surgical History: Reviewed history from 07/02/2009 and no changes required. 1.  1991:   C section 2.  1991:  Laparoscopic cholecystectomy 3.  2006:  Right breast biopsy--benign 4.  2006:  Cervical spine surgery:  Dr. Newell Coral 5.  1997:  Bilateral carpal tunnel release 6.  1994:  BTL  Family History: Mother, died 43  :  Renal failure, DM, CAD, hypertension.  1st MI was age 23,  Father, does not know her father 15 Sisters:  oldest with hypertension, asthma, Unknown joint disease and blood disease.  MIddle sister with unknown mental heatlh issues.  Younger sister fine.   Daughter, 20:  Healthy--history of hypertension and unknown joint complaints--since resolved.  Obese.  Social History: Currently unemployed. Going to school for psychology and African American studies. LIves alone Never married Daughter lives in town--goes to [employer]. No significant other. Is sexually active. Uses Condoms Tobacco Use:  Quit 1999.  Smoked 20 years 1 ppd max Alcohol Use:  occasional Drug Use:  Hx of MJ use--no IV use.  Quit in 1999  Review of Systems General:  Energy up and down.. Eyes:  Bifocals and glaucoma--controlled. ENT:  Denies decreased hearing. CV:  Yesterday with sharp chest pain in sternal area.  Lasted 2 hours yesterday.  Was walking back to computer from kitchen yesterday when started up.  No radiation, though has had Chest pain in distant past with radiation to back.  Cannot recall if she has had a cardiac work up in past.. Resp:  Dyspnea with mild exertion.  Not very physically active.Marland Kitchen GI:  Denies abdominal pain, bloody  stools, constipation, dark tarry stools, and diarrhea. Neuro:  Denies numbness, tingling, and weakness. Psych:  Denies suicidal thoughts/plans; PHQ 9 scored 14.  Physical Exam  General:  MOrbidly obese, NAD Head:  Normocephalic and atraumatic without obvious abnormalities. No apparent alopecia or balding. Eyes:  No corneal or conjunctival  inflammation noted. EOMI. Perrla. Funduscopic exam benign, without hemorrhages, exudates or papilledema. Vision grossly normal. Ears:  External ear exam shows no significant lesions or deformities.  Otoscopic examination reveals clear canals, tympanic membranes are intact bilaterally without bulging, retraction, inflammation or discharge. Hearing is grossly normal bilaterally. Nose:  External nasal examination shows no deformity or inflammation. Nasal mucosa are pink and moist without lesions or exudates. Mouth:  Oral mucosa and oropharynx without lesions or exudates.   Neck:  No deformities, masses, or tenderness noted. Breasts:  No mass, nodules, thickening, tenderness, bulging, retraction, inflamation, nipple discharge or skin changes noted.  Quite large breasts Lungs:  Normal respiratory effort, chest expands symmetrically. Lungs are clear to auscultation, no crackles or wheezes. Heart:  Normal rate and regular rhythm. S1 and S2 normal without gallop, murmur, click, rub or other extra sounds. Abdomen:  Bowel sounds positive,abdomen soft and non-tender without masses, organomegaly or hernias noted. Rectal:  No external abnormalities noted. Normal sphincter tone. No rectal masses or tenderness.  Heme negative light brown stool Genitalia:  Pelvic Exam:        External: normal female genitalia without lesions or masses        Vagina: normal without lesions or masses.  Some thick white discharge.        Cervix: normal without lesions or masses        Adnexa: normal bimanual exam without masses or fullness        Uterus: normal by palpation        Pap smear: performed Msk:  No deformity or scoliosis noted of thoracic or lumbar spine.   Pulses:  R and L carotid,radial,femoral,dorsalis pedis and posterior tibial pulses are full and equal bilaterally Extremities:  No clubbing, cyanosis, or deformity noted with normal full range of motion of all joints.  Trace edema to pretib area bilaterally of  legs Neurologic:  No cranial nerve deficits noted. Station and gait are normal. Plantar reflexes are down-going bilaterally. DTRs are symmetrical throughout. Sensory, motor and coordinative functions appear intact. Skin:  Intact without suspicious lesions or rashes Cervical Nodes:  No lymphadenopathy noted Axillary Nodes:  No palpable lymphadenopathy Inguinal Nodes:  No significant adenopathy Psych:  Cognition and judgment appear intact. Alert and cooperative with normal attention span and concentration. No apparent delusions, illusions, hallucinations   Impression & Recommendations:  Problem # 1:  ROUTINE GYNECOLOGICAL EXAMINATION (ICD-V72.31)  To call if does not receive a mammogram reminder in January  Orders: KOH/ [place] [id number]) Pap Smear, Thin Prep ( Collection of) [phone]) T- GC Chlamydia ([address]11) T-Pap Smear, Thin Prep (91478)  Problem # 2:  HEALTH SCREENING (ICD-V70.0) Immunizations up to date-to return for flu vaccine in fall. Guaiac cards x 3 to return when comes in for fasting labs  Problem # 3:  DIABETES MELLITUS, TYPE II, ON INSULIN (ICD-250.00) Not controlled. Encouraged pt. to keep rereferral to Nutrition To be compliant with meds--consider addition of Byetta if no improvement in follow up Her updated medication list for this problem includes:    Metformin Hcl 1000 Mg Tabs (Metformin hcl) .Marland Kitchen... 1 tab by mouth two times a day with meals    Lantus Solostar 100 Unit/ml Soln (  Insulin glargine) .Marland KitchenMarland KitchenMarland KitchenMarland Kitchen 40 units subcutaneously at bedtime    Novolog 100 Unit/ml Soln (Insulin aspart) ..... Sliding scale    Micardis Hct 80-25 Mg Tabs (Telmisartan-hctz) .Marland Kitchen... 1 tab by mouth daily    Accupril 40 Mg Tabs (Quinapril hcl) .Marland Kitchen... 1 tab by mouth daily    Adult Aspirin Ec Low Strength 81 Mg Tbec (Aspirin) .Marland Kitchen... 1 tab by mouth daily with meal    Glyburide 5 Mg Tabs (Glyburide) .Marland Kitchen... 1 tab by mouth two times a day with meals  Orders: Capillary Blood Glucose/CBG (13086) Nutrition  Referral (Nutrition)  Problem # 4:  DEPRESSION (ICD-311)  HIgh score on PHQ9--referral to [facility]  Orders: Psychology Referral (Psychology)  Problem # 5:  SLEEP APNEA, OBSTRUCTIVE (ICD-327.23) Encouraged bedtime at a reasonable hour so does not miss out on CPAP use. Developing a  vicious cycle with OSA and poor sleep  Complete Medication List: 1)  Metformin Hcl 1000 Mg Tabs (Metformin hcl) .Marland Kitchen.. 1 tab by mouth two times a day with meals 2)  Lantus Solostar 100 Unit/ml Soln (Insulin glargine) .... 40 units subcutaneously at bedtime 3)  Novolog 100 Unit/ml Soln (Insulin aspart) .... Sliding scale 4)  Micardis Hct 80-25 Mg Tabs (Telmisartan-hctz) .Marland Kitchen.. 1 tab by mouth daily 5)  Lipitor 20 Mg Tabs (Atorvastatin calcium) .Marland Kitchen.. 1 tab by mouth daily 6)  Xyzal 5 Mg Tabs (Levocetirizine dihydrochloride) .Marland Kitchen.. 1 tab by mouth daily as needed allergies 7)  Furosemide 20 Mg Tabs (Furosemide) .... 1/2 to 1 tab by mouth in morning 8)  Accupril 40 Mg Tabs (Quinapril hcl) .Marland Kitchen.. 1 tab by mouth daily 9)  Ventolin Hfa 108 (90 Base) Mcg/act Aers (Albuterol sulfate) .... 2 puffs every 4 hours as needed for wheezing. 10)  Alphagan P 0.1 % Soln (Brimonidine tartrate) .Marland Kitchen.. 1 drop each eye two times a day 11)  Azopt 1 % Susp (Brinzolamide) .Marland Kitchen.. 1 drop each eye two times a day 12)  Lumigan 0.01 % Soln (Bimatoprost) .Marland Kitchen.. 1 drop each eye at bedtime 13)  Adult Aspirin Ec Low Strength 81 Mg Tbec (Aspirin) .Marland Kitchen.. 1 tab by mouth daily with meal 14)  Lancets Misc (Lancets) .... Use for three times a day testing 15)  Glucose Monitor Test Strips  .... Test blood sugar three times a day 16)  Glyburide 5 Mg Tabs (Glyburide) .Marland Kitchen.. 1 tab by mouth two times a day with meals 17)  Cpap Pillow  .... Use with cpap/bipap  Other Orders: UA Dipstick w/o Micro (manual) (57846)  Patient Instructions: 1)  Schedule fasting labs:  FLP, CBC, CMET, Urine microalbumin, HIV, RPR 2)  Follow up with Dr. Delrae Alfred in 4 months  --DM  Preventive Care Screening  Bone Density:    Date:  12/12/2004    Results:  AP spine:  T score 1.5    Dual Femur  T score of 1.9--[facility] in [place] std dev  Prior Values:    Mammogram:  ASSESSMENT: Negative - BI-RADS 1^MM DIGITAL SCREENING (10/06/2009)    Colonoscopy:  Dr. Barnetta Chapel, [facility]:  3mm hyperplastic polyp, diverticulosis, internal hemorrhoids (07/16/2008)    Last Tetanus Booster:  Historical (04/18/2006)    Last Flu Shot:  Fluvax 3+ (07/02/2009)    Last Pneumovax:  Historical (09/19/2003)     LMP:  age late 25s. Last pap: May have been 2009.  Not in flow sheet. SBE:  occasionally--no new changes Osteoprevention:  Drinks Silk Milk--1 cup daily.  Occasionally eats cheese or yogurt.  Would be  willing to drink the milk. Guaiac Cards:  Cannot recall if turned in in past--not in flowsheet.  Prescriptions: ACCUPRIL 40 MG TABS (QUINAPRIL HCL) 1 tab by mouth daily  #90 x 3   Entered and Authorized by:   Julieanne Manson MD   Signed by:   Julieanne Manson MD on 04/26/2010   Method used:   Printed then faxed to ...       [facility] Department (retail)       [address] [place], [place]  [address]       Ph: [phone]       Fax: [phone]   RxID:   [id number] ALPHAGAN P 0.1 % SOLN (BRIMONIDINE TARTRATE) 1 drop each eye two times a day  #1 month x 11   Entered and Authorized by:   Julieanne Manson MD   Signed by:   Julieanne Manson MD on 04/26/2010   Method used:   Printed then faxed to ...       [facility] Department (retail)       [address] [place], [place]  [address]       Ph: [phone]       Fax: [phone]   RxID:   [id number] AZOPT 1 % SUSP (BRINZOLAMIDE) 1 drop each eye two times a day  #1 month x 11   Entered and Authorized by:   Julieanne Manson MD   Signed by:   Julieanne Manson MD on 04/26/2010   Method used:   Printed then faxed to ...        [facility] Department (retail)       [address] [place], [place]  [address]       Ph: [phone]       Fax: [phone]   RxID:   [id number] LUMIGAN 0.01 % SOLN (BIMATOPROST) 1 drop each eye at bedtime  #1 month x 11   Entered and Authorized by:   Julieanne Manson MD   Signed by:   Julieanne Manson MD on 04/26/2010   Method used:   Printed then faxed to ...       [facility] Department (retail)       [address] [place], [place]  [address]       Ph: [phone]       Fax: [phone]   RxID:   [id number] LANCETS  MISC (LANCETS) use for three times a day testing  #100 x 11   Entered and Authorized by:   Julieanne Manson MD   Signed by:   Julieanne Manson MD on 04/26/2010   Method used:   Printed then faxed to ...       [facility] Department (retail)       [address] [place], [place]  [address]       Ph: [phone]       Fax: [phone]   RxID:   [id number] GLUCOSE MONITOR TEST STRIPS test blood sugar three times a day  #100 x 11   Entered and Authorized by:   Julieanne Manson MD   Signed by:   Julieanne Manson MD on 04/26/2010   Method used:   Printed then faxed to ...       [facility] Department (retail)       [phone]  [address] [place], [place]  [address]       Ph: [phone]       Fax: [phone]   RxID:   [id number] GLYBURIDE 5 MG TABS (GLYBURIDE) 1 tab by mouth two times a day with meals  #60 x 11   Entered and Authorized by:   Julieanne Manson MD   Signed by:   Julieanne Manson MD on 04/26/2010   Method used:   Printed then faxed to ...       [facility] Department (retail)       [address] [place], [place]  [address]       Ph: [phone]       Fax: [phone]   RxID:   [id number] XYZAL 5 MG TABS (LEVOCETIRIZINE DIHYDROCHLORIDE) 1 tab by mouth daily as needed allergies  #30 x 11   Entered and Authorized by:    Julieanne Manson MD   Signed by:   Julieanne Manson MD on 04/26/2010   Method used:   Printed then faxed to ...       [facility] Department (retail)       [address] [place], [place]  [address]       Ph: [phone]       Fax: [phone]   RxID:   [id number] LIPITOR 20 MG TABS (ATORVASTATIN CALCIUM) 1 tab by mouth daily  #30 x 11   Entered and Authorized by:   Julieanne Manson MD   Signed by:   Julieanne Manson MD on 04/26/2010   Method used:   Printed then faxed to ...       [facility] Department (retail)       [address] [place], [place]  [address]       Ph: [phone]       Fax: [phone]   RxID:   [id number] MICARDIS HCT 80-25 MG TABS (TELMISARTAN-HCTZ) 1 tab by mouth daily  #30 x 11   Entered and Authorized by:   Julieanne Manson MD   Signed by:   Julieanne Manson MD on 04/26/2010   Method used:   Printed then faxed to ...       [facility] Department (retail)       [address] [place], [place]  [address]       Ph: [phone]       Fax: [phone]   RxID:   [id number] NOVOLOG 100 UNIT/ML SOLN (INSULIN ASPART) sliding scale  #10 mL x 11   Entered and Authorized by:   Julieanne Manson MD   Signed by:   Julieanne Manson MD on 04/26/2010   Method used:   Printed then faxed to ...       [facility] Department (retail)       [address] [place], [place]  [address]       Ph: [phone]       Fax: [phone]   RxID:   [id number] LANTUS SOLOSTAR 100 UNIT/ML SOLN (INSULIN GLARGINE) 40 units subcutaneously at bedtime  #1 month x 11   Entered and Authorized by:   Julieanne Manson MD   Signed by:   Julieanne Manson MD on 04/26/2010   Method used:   Printed then faxed to .Marland KitchenMarland Kitchen  [facility] Department (retail)       [address] [place], [place]  [address]       Ph: [phone]       Fax: [phone]   RxID:    [id number] METFORMIN HCL 1000 MG TABS (METFORMIN HCL) 1 tab by mouth two times a day with meals  #60 x 11   Entered and Authorized by:   Julieanne Manson MD   Signed by:   Julieanne Manson MD on 04/26/2010   Method used:   Printed then faxed to ...       [facility] Department (retail)       [address] [place], [place]  [address]       Ph: [phone]       Fax: [phone]   RxID:   [id number]   Appended Document: cpp exam//gk  Laboratory Results   Blood Tests     HGBA1C: 10.3%   (Normal Range: Non-Diabetic - 3-6%   Control Diabetic - 6-8%)      Appended Document: cpp exam//gk     Allergies: No Known Drug Allergies   Complete Medication List: 1)  Metformin Hcl 1000 Mg Tabs (Metformin hcl) .Marland Kitchen.. 1 tab by mouth two times a day with meals 2)  Lantus Solostar 100 Unit/ml Soln (Insulin glargine) .... 40 units subcutaneously at bedtime 3)  Novolog 100 Unit/ml Soln (Insulin aspart) .... Sliding scale 4)  Micardis Hct 80-25 Mg Tabs (Telmisartan-hctz) .Marland Kitchen.. 1 tab by mouth daily 5)  Lipitor 20 Mg Tabs (Atorvastatin calcium) .Marland Kitchen.. 1 tab by mouth daily 6)  Xyzal 5 Mg Tabs (Levocetirizine dihydrochloride) .Marland Kitchen.. 1 tab by mouth daily as needed allergies 7)  Furosemide 20 Mg Tabs (Furosemide) .... 1/2 to 1 tab by mouth in morning 8)  Accupril 40 Mg Tabs (Quinapril hcl) .Marland Kitchen.. 1 tab by mouth daily 9)  Ventolin Hfa 108 (90 Base) Mcg/act Aers (Albuterol sulfate) .... 2 puffs every 4 hours as needed for wheezing. 10)  Alphagan P 0.1 % Soln (Brimonidine tartrate) .Marland Kitchen.. 1 drop each eye two times a day 11)  Azopt 1 % Susp (Brinzolamide) .Marland Kitchen.. 1 drop each eye two times a day 12)  Lumigan 0.01 % Soln (Bimatoprost) .Marland Kitchen.. 1 drop each eye at bedtime 13)  Adult Aspirin Ec Low Strength 81 Mg Tbec (Aspirin) .Marland Kitchen.. 1 tab by mouth daily with meal 14)  Lancets Misc (Lancets) .... Use for three times a day testing 15)  Glucose Monitor Test Strips  .... Test blood sugar three  times a day 16)  Glyburide 5 Mg Tabs (Glyburide) .Marland Kitchen.. 1 tab by mouth two times a day with meals 17)  Cpap Pillow  .... Use with cpap/bipap   Laboratory Results  Date/Time Received: May 02, 2010 4:24 PM   Stool - Occult Blood Hemmoccult #1: negative Date: 05/02/2010 Hemoccult #2: negative Date: 05/02/2010 Hemoccult #3: negative Date: 05/02/2010

## 2010-10-18 NOTE — Letter (Signed)
Summary: TEST ORDER FORM//CRAP//BIPAP//APPT DATE & TIME  TEST ORDER FORM//CRAP//BIPAP//APPT DATE & TIME   Imported By: Arta Bruce 11/03/2009 14:59:04  _____________________________________________________________________  External Attachment:    Type:   Image     Comment:   External Document

## 2010-10-18 NOTE — Letter (Signed)
Summary: [facility]   Imported By: Arta Bruce 04/08/2010 15:46:16  _____________________________________________________________________  External Attachment:    Type:   Image     Comment:   External Document

## 2010-10-18 NOTE — Letter (Signed)
Summary: *HSN Results Follow up  [facility]  [address] [place], [place] [address]   Phone: [phone]  Fax: [phone]      05/24/2010   [name] [address] [place], [place]  [address]   Dear  [name],                            ____S.Drinkard,FNP   ____D. Gore,FNP       ____B. McPherson,MD   ____V. Rankins,MD    __X__E. Mulberry,MD    ____N. Daphine Deutscher, FNP  ____D. Reche Dixon, MD    ____K. Philipp Deputy, MD    ____Other     This letter is to inform you that your recent test(s):  _______Pap Smear    _______Lab Test     ____X___X-ray    _______ is within acceptable limits  _______ requires a medication change  _______ requires a follow-up lab visit  _______ requires a follow-up visit with your Maygen Sirico   Comments:  Pelvic ultrasound showed fibroids (Noncancerous growths in the uterus) and some increased thickness in the lining of your uterus for someone who is post menopausal.  We'll see what the gynecologist at [facility] says, but you should be getting a biopsy of the lining as we discussed.       _________________________________________________________ If you have any questions, please contact our office                     Sincerely,  Christina Manson MD [facility]

## 2010-10-18 NOTE — Assessment & Plan Note (Signed)
Summary: follow up with Dr Delrae Alfred in 4 months/ dm//gk   Vital Signs:  Patient profile:   55 year old female Menstrual status:  postmenopausal Weight:      250 pounds Temp:     97.6 degrees F Pulse rate:   66 / minute Pulse rhythm:   regular Resp:     16 per minute BP sitting:   136 / 77  (left arm) Cuff size:   large  Vitals Entered By: Vesta Mixer CMA (December 24, 2009 9:07 AM) CC: f/u diabetes.   Wants a new Rx for metformin 1000mg  instead of two 500mg  pills Is Patient Diabetic? Yes Pain Assessment Patient in pain? no      CBG Result 215  Does patient need assistance? Ambulation Normal   CC:  f/u diabetes.   Wants a new Rx for metformin 1000mg  instead of two 500mg  pills.  History of Present Illness: 1.  OSA:  Using full mask set for bipap for 1 1/2 weeks.  Feels much better rested.  Sleeping longer and less fatigue during day.  Needs special pillow for sleeping with her apparatus  2.  DM:  A1C is 9.5%.  Somewhat improved.  States sugars fluctuating a lot.  Unable to give me actual numbers.  Not eating at regular times.  Struggles with school and feels this disorganizes her.  Was also out of insulin for a week--just had difficulties getting to pharmacy to pick up.  Not taking meds when should at times--or not at all--last week, occurred 3 days.  Has tried to eat better foods.  3.  Requesting a lift chair for difficulty getting out of her chair with right ankle partial achilles tendon tear.  Never went for surgery.  Pt. also morbildy obese.  4.  Hyperlipidemia:  at goal in December.  5.  Mastalgia:  Better now--comes and goes.  Currently okay.  Ibuprofen helps if has a flare.  Allergies (verified): No Known Drug Allergies  Physical Exam  General:  MOrbidly obese, NAD Lungs:  Normal respiratory effort, chest expands symmetrically. Lungs are clear to auscultation, no crackles or wheezes. Heart:  Normal rate and regular rhythm. S1 and S2 normal without gallop, murmur,  click, rub or other extra sounds.  Radial pulses normal and equal   Impression & Recommendations:  Problem # 1:  MASTALGIA (ICD-611.71) Mammogram okay.   Symptoms on and off--follow--feel this is a benign process  Problem # 2:  RIGHT ACHILLES PARTIAL TENDON TEAR (ICD-845.09) Encouraged pt. to work toward a repair rather than spending money on a lift chair. Feel her problem is more related to her weight and would like her to stay physically active, even with getting out of a chair Her updated medication list for this problem includes:    Adult Aspirin Ec Low Strength 81 Mg Tbec (Aspirin) .Marland Kitchen... 1 tab by mouth daily with meal  Problem # 3:  SLEEP APNEA, OBSTRUCTIVE (ICD-327.23) Improved already with BIpap  Problem # 4:  DIABETES MELLITUS, TYPE II, ON INSULIN (ICD-250.00) Pt. to work on staying on meds and not missing meals Her updated medication list for this problem includes:    Metformin Hcl 1000 Mg Tabs (Metformin hcl) .Marland Kitchen... 1 tab by mouth two times a day with meals    Lantus Solostar 100 Unit/ml Soln (Insulin glargine) .Marland KitchenMarland KitchenMarland KitchenMarland Kitchen 40 units subcutaneously at bedtime    Novolog 100 Unit/ml Soln (Insulin aspart) ..... Sliding scale    Micardis Hct 80-25 Mg Tabs (Telmisartan-hctz) .Marland Kitchen... 1 tab by  mouth daily    Accupril 40 Mg Tabs (Quinapril hcl) .Marland Kitchen... 1 tab by mouth daily    Adult Aspirin Ec Low Strength 81 Mg Tbec (Aspirin) .Marland Kitchen... 1 tab by mouth daily with meal    Glyburide 5 Mg Tabs (Glyburide) .Marland Kitchen... 1 tab by mouth two times a day with meals  Orders: Capillary Blood Glucose/CBG (82948) Hemoglobin A1C (83036)  Problem # 5:  HYPERLIPIDEMIA (ICD-272.4) controlled Her updated medication list for this problem includes:    Lipitor 20 Mg Tabs (Atorvastatin calcium) .Marland Kitchen... 1 tab by mouth daily  Problem # 6:  HYPERTENSION, ESSENTIAL (ICD-401.9) controlled Her updated medication list for this problem includes:    Micardis Hct 80-25 Mg Tabs (Telmisartan-hctz) .Marland Kitchen... 1 tab by mouth daily     Furosemide 20 Mg Tabs (Furosemide) .Marland Kitchen... 1/2 to 1 tab by mouth in morning    Accupril 40 Mg Tabs (Quinapril hcl) .Marland Kitchen... 1 tab by mouth daily  Complete Medication List: 1)  Metformin Hcl 1000 Mg Tabs (Metformin hcl) .Marland Kitchen.. 1 tab by mouth two times a day with meals 2)  Lantus Solostar 100 Unit/ml Soln (Insulin glargine) .... 40 units subcutaneously at bedtime 3)  Novolog 100 Unit/ml Soln (Insulin aspart) .... Sliding scale 4)  Micardis Hct 80-25 Mg Tabs (Telmisartan-hctz) .Marland Kitchen.. 1 tab by mouth daily 5)  Lipitor 20 Mg Tabs (Atorvastatin calcium) .Marland Kitchen.. 1 tab by mouth daily 6)  Xyzal 5 Mg Tabs (Levocetirizine dihydrochloride) .Marland Kitchen.. 1 tab by mouth daily as needed allergies 7)  Furosemide 20 Mg Tabs (Furosemide) .... 1/2 to 1 tab by mouth in morning 8)  Accupril 40 Mg Tabs (Quinapril hcl) .Marland Kitchen.. 1 tab by mouth daily 9)  Ventolin Hfa 108 (90 Base) Mcg/act Aers (Albuterol sulfate) .... 2 puffs every 4 hours as needed for wheezing. 10)  Alphagan P 0.1 % Soln (Brimonidine tartrate) .Marland Kitchen.. 1 drop each eye two times a day 11)  Azopt 1 % Susp (Brinzolamide) .Marland Kitchen.. 1 drop each eye two times a day 12)  Lumigan 0.03 % Soln (Bimatoprost) .Marland Kitchen.. 1 drop each eye at bedtime 13)  Adult Aspirin Ec Low Strength 81 Mg Tbec (Aspirin) .Marland Kitchen.. 1 tab by mouth daily with meal 14)  Lancets Misc (Lancets) .... Use for three times a day testing 15)  Glucose Monitor Test Strips  .... Test blood sugar three times a day 16)  Ferrous Sulfate 325 (65 Fe) Mg Tabs (Ferrous sulfate) .Marland Kitchen.. 1 tab by mouth daily 17)  Glyburide 5 Mg Tabs (Glyburide) .Marland Kitchen.. 1 tab by mouth two times a day with meals 18)  Cpap Pillow  .... Use with cpap/bipap  Patient Instructions: 1)  Follow up with Dr. Delrae Alfred in 4 months--CPP Prescriptions: CPAP PILLOW Use with CPAP/Bipap  #1 x 0   Entered and Authorized by:   Julieanne Manson MD   Signed by:   Julieanne Manson MD on 12/24/2009   Method used:   Print then Give to Patient   RxID:   [id number] GLYBURIDE 5 MG  TABS (GLYBURIDE) 1 tab by mouth two times a day with meals  #60 x 11   Entered and Authorized by:   Julieanne Manson MD   Signed by:   Julieanne Manson MD on 12/24/2009   Method used:   Electronically to        [facility] [address] (retail)       [address]       [place], [place]  [address]  Ph: [phone] or [phone]       Fax: [phone]   RxID:   [id number] METFORMIN HCL 1000 MG TABS (METFORMIN HCL) 1 tab by mouth two times a day with meals  #60 x 11   Entered and Authorized by:   Julieanne Manson MD   Signed by:   Julieanne Manson MD on 12/24/2009   Method used:   Electronically to        [facility] [address] [address] (retail)       [address]       [place], [place]  [address]       Ph: [phone] or [phone]       Fax: [phone]   RxID:   [id number]    Tetanus/Td Immunization History:    Tetanus/Td # 1:  Historical (04/18/2006)  Influenza Immunization History:    Influenza # 1:  Fluvax 3+ (07/02/2009)  Pneumovax Immunization History:    Pneumovax # 1:  Historical (09/19/2003)   Appended Document: follow up with Dr Delrae Alfred in 4 months/ dm//gk A1C documentation please   Clinical Lists Changes  Observations: Added new observation of HGBA1C: 9.9 % (12/24/2009 17:55)      Laboratory Results   Blood Tests     HGBA1C: 9.9%   (Normal Range: Non-Diabetic - 3-6%   Control Diabetic - 6-8%)

## 2010-10-18 NOTE — Letter (Signed)
Summary: STAFF MEDICAL REPORT  STAFF MEDICAL REPORT   Imported By: Arta Bruce 05/17/2010 12:23:30  _____________________________________________________________________  External Attachment:    Type:   Image     Comment:   External Document

## 2010-10-18 NOTE — Progress Notes (Signed)
Summary: gynecology referral  Phone Note Outgoing Call   Summary of Call: [name]-please see 4th append on previous phone note regarding uterine bleeding--needs gynecology referral--ultrasound of pelvis pending.  Letter written Initial call taken by: Julieanne Manson MD,  May 09, 2010 11:52 AM  Follow-up for Phone Call        SEND A REFERRAL WAITING FOR AN APPT  .Marland KitchenCheryll Pierce  May 11, 2010 10:33 AM PT HAVE AN APPT 06-02-10 @ 3:15PM  Follow-up by: [name],  May 11, 2010 3:03 PM  Additional Follow-up for Phone Call Additional follow up Details #1::

## 2010-10-18 NOTE — Progress Notes (Signed)
Summary: NEEDS METER/LETTER/MEDS  Phone Note Call from Patient Call back at Home Phone [phone]   Summary of Call: MULBERRY PT. WENT TO SEE THE NUTRIONIST YESTERDAY, AND SHE WAS TOLD BY THE NUTRIONIST TO CALL AND LET YOU KOW THAT THEY WANT HER TO TEST 4 TO 5 TIMES A DAY. SHE WAS TOLD THAT IF SHE GETS A RX FOR A NEW GLUCOMETER, TEST STRIPS, AND LANCETS. SHE USES [facility], BUT DOES NOT KNOW THE NAME OF THE METER THAT THEY HAVE.  MS Gill SAYS THAT WHEN SHE CAME FOR HER CPP, SHE WAS TAKING 45 UNITS OF LANTUS, SHE RAN OUT FASTER AND NOW NEEDS A NEW RX SO SHE WONT RUN OUT FASTER.  SHE IS ALSO ASKING ABOUT A FAX THAT SHE SENT ABOUT ASKING FOR HELP AT [facility] [employer] AND SHE NEEDS A LETTER. Initial call taken by: Leodis Rains,  June 21, 2010 3:39 PM  Follow-up for Phone Call        Called and spoke with pt. regarding paperwork for [facility] [employer] requesting assistance in her studies for physical disabilities and systemic illnesses.  Pt. states she needs this as some days she is just not alert to take notes, etc.   Discussed she needs to be compliant with use of CPAP to avoid this--she admitted this was a problem at last visit.   She also mentions her DM as a reason for her difficulties with alertness, but unable to give a reason why DM affects her so. Discussed she needs to have an evaluation for her difficulties documented and we do not have that--she can check with the school to see if they have something in place to have her evaluated psychometric or psycho-educational assessments as requested in the information packet from [facility].   Pt. states she has had a vocational evaluation in past, but not deemed to have a learning difficulty via that. Will call in her request for increased insulin and test strips. Follow-up by: Julieanne Manson MD,  June 22, 2010 11:07 AM    New/Updated Medications: LANTUS SOLOSTAR 100 UNIT/ML SOLN (INSULIN GLARGINE) 45 units subcutaneously at  bedtime LANCETS  MISC (LANCETS) use for 4-5  times a day testing * GLUCOSE MONITOR TEST STRIPS test blood sugar 4-5  times a day Prescriptions: LANCETS  MISC (LANCETS) use for 4-5  times a day testing  #150 x 11   Entered and Authorized by:   Julieanne Manson MD   Signed by:   Julieanne Manson MD on 06/22/2010   Method used:   Printed then faxed to ...       [facility] (retail)       [address] [place], [place]  [address]       Ph: [phone]       Fax: [phone]   RxID:   [id number] GLUCOSE MONITOR TEST STRIPS test blood sugar 4-5  times a day  #150 x 11   Entered and Authorized by:   Julieanne Manson MD   Signed by:   Julieanne Manson MD on 06/22/2010   Method used:   Printed then faxed to ...       [facility] Department (retail)       [address] [place], [place]  [address]       Ph: [phone]       Fax: [phone]   RxID:   [id number] LANTUS SOLOSTAR 100 UNIT/ML  SOLN (INSULIN GLARGINE) 45 units subcutaneously at bedtime  #1 month x 11   Entered and Authorized by:   Julieanne Manson MD   Signed by:   Julieanne Manson MD on 06/22/2010   Method used:   Printed then faxed to ...       [facility] Department (retail)       [address] [place], [place]  [address]       Ph: [phone]       Fax: [phone]   RxID:   [id number]

## 2010-10-18 NOTE — Letter (Signed)
[facility]PT [facility]  [facility]   Imported By: Arta Bruce 05/12/2010 15:10:17  _____________________________________________________________________  External Attachment:    Type:   Image     Comment:   External Document

## 2010-10-18 NOTE — Progress Notes (Signed)
  Phone Note From Pharmacy   Caller: [facility] Summary of Call: Please call pharmacy--it appears they did not get the Rx refill of Ventolin back in January with a refill--please call and confirm that it was refilled with another refill. Initial call taken by: Julieanne Manson MD,  October 28, 2009 7:30 AM  Follow-up for Phone Call        Spoke with Emelda Fear at [facility], they did not receive the refill for Ventolin in January.  Gave verbal refill for #1/1. Follow-up by: Vesta Mixer CMA,  November 02, 2009 10:15 AM

## 2010-10-18 NOTE — Letter (Signed)
Summary: REQUESTING RECORDS FROM [facility]   REQUESTING RECORDS FROM [facility]   Imported By: Arta Bruce 10/27/2009 12:34:46  _____________________________________________________________________  External Attachment:    Type:   Image     Comment:   External Document

## 2010-10-18 NOTE — Letter (Signed)
Summary: TRCEIVED RECORDS FROM [facility]  TRCEIVED RECORDS FROM [facility]   Imported By: Arta Bruce 05/05/2010 15:47:10  _____________________________________________________________________  External Attachment:    Type:   Image     Comment:   External Document

## 2010-10-18 NOTE — Letter (Signed)
Summary: SUMMARY/AMANDA  SUMMARY/AMANDA   Imported By: Arta Bruce 05/27/2010 10:23:12  _____________________________________________________________________  External Attachment:    Type:   Image     Comment:   External Document

## 2010-10-18 NOTE — Letter (Signed)
Summary: NUTRITION & DIABETES  NUTRITION & DIABETES   Imported By: Arta Bruce 10/28/2009 14:48:42  _____________________________________________________________________  External Attachment:    Type:   Image     Comment:   External Document

## 2010-10-18 NOTE — Progress Notes (Signed)
  Phone Note Outgoing Call   Summary of Call: Please call Ms. Moeller--meant to order an EKG on her today and did not do so.   Have her come in for no charge for actual visit but charge for EKG, CPK, Troponin I  and get order for CXR for Chest pain  Follow-up for Phone Call        Left message on answering machine for pt to return call at [phone]. Follow-up by: Vesta Mixer CMA,  April 27, 2010 10:00 AM  Additional Follow-up for Phone Call Additional follow up Details #1::        Pt aware and has an appt already for monday and wants to do it all then. Additional Follow-up by: Vesta Mixer CMA,  April 27, 2010 10:15 AM  New Problems: CHEST PAIN (ICD-786.50)   New Problems: CHEST PAIN (ICD-786.50)

## 2010-10-18 NOTE — Letter (Signed)
Summary: [facility]UTRITION [facility] DIABETES MANAGEMENT  NUTRITION & DIABETES MANAGEMENT   Imported By: Arta Bruce 07/27/2010 14:31:16  _____________________________________________________________________  External Attachment:    Type:   Image     Comment:   External Document

## 2010-10-18 NOTE — Letter (Signed)
Summary: NUTRITION & DIABETES/NO SHOWED  NUTRITION & DIABETES/NO SHOWED   Imported By: Arta Bruce 04/06/2010 11:53:18  _____________________________________________________________________  External Attachment:    Type:   Image     Comment:   External Document

## 2010-10-18 NOTE — Progress Notes (Signed)
Summary: Medical question  Phone Note Call from Patient Call back at [phone]   Summary of Call: Since yesterday, the pt had a bleeding discharge and she is wondered to know from the nurse if that is normal or abnorall and the main reason is because she doesn't has a period since last five years.  Can somoene call her back? Mulberry MD Initial call taken by: Manon Hilding,  May 05, 2010 8:08 AM  Follow-up for Phone Call        Left message on answering machine for pt. to return call.  Dutch Quint RN  May 05, 2010 10:33 AM  Having light vaginal bleeding, slight cramping, only comes out when she wipes herself.  Has been under a lot of stress. Just wants to make sure this is "nothing to be alarmed about."  Advised that it is not unusual to have slight episodes of breakthrough bleeding, but that I would notify provider. Follow-up by: Dutch Quint RN,  May 05, 2010 10:55 AM  Additional Follow-up for Phone Call Additional follow up Details #1::        if spotting persists through the weekend she needs to call office to schedule an appt  Additional Follow-up by: Lehman Prom FNP,  May 05, 2010 5:42 PM    Additional Follow-up for Phone Call Additional follow up Details #2::    Pt. notified of provider's recommendations - verbalized agreement.  Dutch Quint RN  May 06, 2010 10:58 AM

## 2010-10-18 NOTE — Letter (Signed)
Summary: NUTRITION & DIABETES MANAGEMENT//PROGRESS NOTE  NUTRITION [facility] DIABETES MANAGEMENT//PROGRESS NOTE   Imported ByArta Bruce 08/17/2010 11:50:09  _____________________________________________________________________  External Attachment:    Type:   Image     Comment:   External Document

## 2010-10-19 ENCOUNTER — Encounter: Payer: Self-pay | Admitting: Internal Medicine

## 2010-10-20 NOTE — Letter (Signed)
Summary: [facility]//SELF MANAGEMENT  [facility]//SELF MANAGEMENT   Imported By: Arta Bruce 09/05/2010 12:00:24  _____________________________________________________________________  External Attachment:    Type:   Image     Comment:   External Document

## 2010-11-05 ENCOUNTER — Emergency Department (HOSPITAL_COMMUNITY)
Admission: EM | Admit: 2010-11-05 | Discharge: 2010-11-06 | Disposition: A | Discharge status: Home / Self Care | Payer: Self-pay | Attending: Emergency Medicine | Admitting: Emergency Medicine

## 2010-11-05 DIAGNOSIS — R5381 Other malaise: Secondary | ICD-10-CM | POA: Insufficient documentation

## 2010-11-05 DIAGNOSIS — R42 Dizziness and giddiness: Secondary | ICD-10-CM | POA: Insufficient documentation

## 2010-11-05 DIAGNOSIS — E7800 Pure hypercholesterolemia, unspecified: Secondary | ICD-10-CM | POA: Insufficient documentation

## 2010-11-05 DIAGNOSIS — J45909 Unspecified asthma, uncomplicated: Secondary | ICD-10-CM | POA: Insufficient documentation

## 2010-11-05 DIAGNOSIS — I10 Essential (primary) hypertension: Secondary | ICD-10-CM | POA: Insufficient documentation

## 2010-11-05 DIAGNOSIS — R110 Nausea: Secondary | ICD-10-CM | POA: Insufficient documentation

## 2010-11-05 DIAGNOSIS — E119 Type 2 diabetes mellitus without complications: Secondary | ICD-10-CM | POA: Insufficient documentation

## 2010-11-05 LAB — URINALYSIS, ROUTINE W REFLEX MICROSCOPIC
Bilirubin Urine: NEGATIVE
Hgb urine dipstick: NEGATIVE
Specific Gravity, Urine: 1.029 (ref 1.005–1.030)
Urobilinogen, UA: 0.2 mg/dL (ref 0.0–1.0)

## 2010-11-05 LAB — URINE MICROSCOPIC-ADD ON

## 2010-11-05 LAB — GLUCOSE, CAPILLARY

## 2010-11-06 LAB — KETONES, QUALITATIVE: Acetone, Bld: NEGATIVE

## 2010-11-06 LAB — DIFFERENTIAL
Basophils Absolute: 0 10*3/uL (ref 0.0–0.1)
Lymphocytes Relative: 24 % (ref 12–46)
Monocytes Absolute: 0.5 10*3/uL (ref 0.1–1.0)
Neutro Abs: 5.7 10*3/uL (ref 1.7–7.7)
Neutrophils Relative %: 68 % (ref 43–77)

## 2010-11-06 LAB — GLUCOSE, CAPILLARY: Glucose-Capillary: 400 mg/dL — ABNORMAL HIGH (ref 70–99)

## 2010-11-06 LAB — BASIC METABOLIC PANEL
CO2: 25 mEq/L (ref 19–32)
Glucose, Bld: 531 mg/dL — ABNORMAL HIGH (ref 70–99)
Potassium: 5.2 mEq/L — ABNORMAL HIGH (ref 3.5–5.1)
Sodium: 133 mEq/L — ABNORMAL LOW (ref 135–145)

## 2010-11-06 LAB — CBC
HCT: 35.6 % — ABNORMAL LOW (ref 36.0–46.0)
Hemoglobin: 11.2 g/dL — ABNORMAL LOW (ref 12.0–15.0)
RBC: 4.39 MIL/uL (ref 3.87–5.11)
WBC: 8.4 10*3/uL (ref 4.0–10.5)

## 2010-11-24 NOTE — Letter (Signed)
[facility]: [facility]   Imported By: Arta Bruce 11/18/2010 10:54:47  _____________________________________________________________________  External Attachment:    Type:   Image     Comment:   External Document

## 2010-11-29 LAB — CBC
HCT: 36.1 % (ref 36.0–46.0)
MCH: 25.8 pg — ABNORMAL LOW (ref 26.0–34.0)
MCV: 80.8 fL (ref 78.0–100.0)
Platelets: 151 10*3/uL (ref 150–400)
RBC: 4.46 MIL/uL (ref 3.87–5.11)
RDW: 16.1 % — ABNORMAL HIGH (ref 11.5–15.5)

## 2010-11-29 LAB — BASIC METABOLIC PANEL
BUN: 9 mg/dL (ref 6–23)
Chloride: 104 mEq/L (ref 96–112)
Creatinine, Ser: 0.96 mg/dL (ref 0.4–1.2)
Glucose, Bld: 198 mg/dL — ABNORMAL HIGH (ref 70–99)

## 2010-11-29 LAB — PREGNANCY, URINE: Preg Test, Ur: NEGATIVE

## 2010-11-29 LAB — GLUCOSE, CAPILLARY: Glucose-Capillary: 225 mg/dL — ABNORMAL HIGH (ref 70–99)

## 2010-12-24 LAB — POCT I-STAT, CHEM 8
BUN: 14 mg/dL (ref 6–23)
Chloride: 102 mEq/L (ref 96–112)
Creatinine, Ser: 0.9 mg/dL (ref 0.4–1.2)
Potassium: 4.3 mEq/L (ref 3.5–5.1)
Sodium: 135 mEq/L (ref 135–145)

## 2010-12-24 LAB — CULTURE, ROUTINE-ABSCESS: Gram Stain: NONE SEEN

## 2011-01-02 LAB — COMPREHENSIVE METABOLIC PANEL
ALT: 26 U/L (ref 0–35)
AST: 30 U/L (ref 0–37)
Albumin: 3.6 g/dL (ref 3.5–5.2)
Alkaline Phosphatase: 58 U/L (ref 39–117)
BUN: 14 mg/dL (ref 6–23)
CO2: 25 mEq/L (ref 19–32)
CO2: 29 mEq/L (ref 19–32)
Chloride: 104 mEq/L (ref 96–112)
Chloride: 107 mEq/L (ref 96–112)
Creatinine, Ser: 1.11 mg/dL (ref 0.4–1.2)
Creatinine, Ser: 1.29 mg/dL — ABNORMAL HIGH (ref 0.4–1.2)
GFR calc Af Amer: >60 mL/min (ref >60)
GFR calc non Af Amer: 44 mL/min — ABNORMAL LOW (ref >60)
GFR calc non Af Amer: 52 mL/min — ABNORMAL LOW (ref >60)
Glucose, Bld: 140 mg/dL — ABNORMAL HIGH (ref 70–99)
Potassium: 3.6 mEq/L (ref 3.5–5.1)
Potassium: 3.6 mEq/L (ref 3.5–5.1)
Sodium: 140 mEq/L (ref 135–145)
Total Bilirubin: 0.8 mg/dL (ref 0.3–1.2)
Total Bilirubin: 0.8 mg/dL (ref 0.3–1.2)

## 2011-01-02 LAB — URINALYSIS, ROUTINE W REFLEX MICROSCOPIC
Bilirubin Urine: NEGATIVE
Glucose, UA: NEGATIVE mg/dL
Ketones, ur: NEGATIVE mg/dL
Nitrite: NEGATIVE
Protein, ur: NEGATIVE mg/dL

## 2011-01-02 LAB — BLOOD GAS, ARTERIAL
Acid-Base Excess: 4.9 mmol/L — ABNORMAL HIGH (ref 0.0–2.0)
Drawn by: 129801
O2 Saturation: 94.6 %
TCO2: 26.5 mmol/L (ref 0–100)
pCO2 arterial: 41 mmHg (ref 35.0–45.0)
pO2, Arterial: 83 mmHg (ref 80.0–100.0)

## 2011-01-02 LAB — CBC
HCT: 29.6 % — ABNORMAL LOW (ref 36.0–46.0)
HCT: 30.6 % — ABNORMAL LOW (ref 36.0–46.0)
HCT: 32.9 % — ABNORMAL LOW (ref 36.0–46.0)
Hemoglobin: 10.5 g/dL — ABNORMAL LOW (ref 12.0–15.0)
Hemoglobin: 9.5 g/dL — ABNORMAL LOW (ref 12.0–15.0)
MCHC: 31.9 g/dL (ref 30.0–36.0)
MCV: 81.1 fL (ref 78.0–100.0)
MCV: 81.1 fL (ref 78.0–100.0)
MCV: 81.2 fL (ref 78.0–100.0)
RBC: 3.65 MIL/uL — ABNORMAL LOW (ref 3.87–5.11)
RBC: 3.78 MIL/uL — ABNORMAL LOW (ref 3.87–5.11)
RBC: 4.05 MIL/uL (ref 3.87–5.11)
WBC: 9 10*3/uL (ref 4.0–10.5)
WBC: 9.2 10*3/uL (ref 4.0–10.5)

## 2011-01-02 LAB — CARDIAC PANEL(CRET KIN+CKTOT+MB+TROPI)
CK, MB: 2.2 ng/mL (ref 0.3–4.0)
Relative Index: 0.6 (ref 0.0–2.5)
Total CK: 343 U/L — ABNORMAL HIGH (ref 7–177)

## 2011-01-02 LAB — LIPID PANEL
HDL: 65 mg/dL (ref >39)
Total CHOL/HDL Ratio: 2.1 RATIO
VLDL: 15 mg/dL (ref 0–40)

## 2011-01-02 LAB — DIFFERENTIAL
Basophils Absolute: 0 10*3/uL (ref 0.0–0.1)
Basophils Relative: 1 % (ref 0–1)
Eosinophils Absolute: 0.2 10*3/uL (ref 0.0–0.7)
Eosinophils Absolute: 0.3 10*3/uL (ref 0.0–0.7)
Eosinophils Relative: 2 % (ref 0–5)
Eosinophils Relative: 4 % (ref 0–5)
Lymphocytes Relative: 12 % (ref 12–46)
Lymphs Abs: 2.5 10*3/uL (ref 0.7–4.0)
Monocytes Absolute: 0.3 10*3/uL (ref 0.1–1.0)
Monocytes Relative: 4 % (ref 3–12)
Neutrophils Relative %: 63 % (ref 43–77)

## 2011-01-02 LAB — LIPASE, BLOOD
Lipase: 17 U/L (ref 11–59)
Lipase: 33 U/L (ref 11–59)

## 2011-01-02 LAB — POCT I-STAT, CHEM 8
BUN: 13 mg/dL (ref 6–23)
Chloride: 110 mEq/L (ref 96–112)
Creatinine, Ser: 1.2 mg/dL (ref 0.4–1.2)
Glucose, Bld: 57 mg/dL — ABNORMAL LOW (ref 70–99)
Hemoglobin: 10.9 g/dL — ABNORMAL LOW (ref 12.0–15.0)
Potassium: 3.7 mEq/L (ref 3.5–5.1)
Sodium: 144 mEq/L (ref 135–145)

## 2011-01-02 LAB — GLUCOSE, CAPILLARY
Glucose-Capillary: 111 mg/dL — ABNORMAL HIGH (ref 70–99)
Glucose-Capillary: 117 mg/dL — ABNORMAL HIGH (ref 70–99)
Glucose-Capillary: 140 mg/dL — ABNORMAL HIGH (ref 70–99)
Glucose-Capillary: 93 mg/dL (ref 70–99)

## 2011-01-02 LAB — POCT CARDIAC MARKERS: Myoglobin, poc: 136 ng/mL (ref 12–200)

## 2011-01-02 LAB — HEMOGLOBIN A1C: Mean Plasma Glucose: 203 mg/dL

## 2011-01-02 LAB — CK TOTAL AND CKMB (NOT AT ARMC): Relative Index: 0.7 (ref 0.0–2.5)

## 2011-01-31 NOTE — Discharge Summary (Signed)
[name], [name]               ACCOUNT NO.:  [id number]   MEDICAL RECORD NO.:  [id number]          PATIENT TYPE:  INP   LOCATION:  1435                         FACILITY:  [facility]   PHYSICIAN:  Hillery Aldo, M.D.   DATE OF BIRTH:  [birth year]   DATE OF ADMISSION:  09/29/2008  DATE OF DISCHARGE:  10/01/2008                               DISCHARGE SUMMARY   PRIMARY CARE PHYSICIAN:  None.  Patient will be referred to Dr. Lonia Blood for hospital followup and for primary care.   DISCHARGE DIAGNOSES:  1. Dyspnea, likely due to obesity hypoventilation syndrome/obstructive      sleep apnea.  2. Obesity.  3. Hypertension.  4. Diabetes mellitus.  5. Chronic renal insufficiency.  6. Anxiety.  7. Dyslipidemia.   DISCHARGE MEDICATIONS:  1. Altace 10 mg daily.  2. Micardis 80/25 mg daily.  3. Lantus 45 units subcutaneously q.h.s.  4. Metformin 1000 mg daily, to resume on October 03, 2008.  5. Sliding scale insulin, moderate scale, q.a.c. and h.s.  6. Lipitor 10 mg daily.  7. Januvia 50 mg daily.   CONSULTATIONS:  None.   BRIEF ADMISSION HPI:  Patient is a 55 year old female who presented to  the hospital with a chief complaint of progressive dyspnea.  Patient  reports worsening dyspnea over a 2-week period of time with resultant 2-  to 3-pillow orthopnea and worsening lower extremity edema.  She was  admitted for further evaluation and workup.  For the full details,  please see the dictated report done by Dr. Flonnie Overman.   PROCEDURES AND DIAGNOSTIC STUDIES:  1. Chest x-ray on September 29, 2008, showed mild changes of CHF.  2. CT angiogram of the chest on September 30, 2008, showed no evidence      of pulmonary embolism.  Normal CT appearance of the thoracic aorta.      Minimal atherosclerotic calcifications.  Dependent bibasilar      atelectasis but no edema, infiltrates, or effusions.  3. Two-dimensional echocardiogram on September 30, 2008, showed normal      left ventricular systolic  function with an ejection fraction      estimated at 60% to 65%.  Study was inadequate for the evaluation      of left ventricular regional wall motion.  Left ventricular wall      thickness mildly to moderately increased.  The estimated peak      pulmonary artery systolic pressure was mildly increased.  4. Pulmonary function tests done on October 01, 2008, showed no      obstructive lung defect indicated by the FEV-1/FVC ratio.  Moderate      restrictive lung defect.  Mild decrease in diffusing capacity.      FEF25-75 changed by 23%.  This is interpreted as a mild response to      bronchodilator.   HOSPITAL COURSE BY PROBLEM:  1. Dyspnea:  Patient was evaluated fully with diagnostic testing as      indicated above.  Because of her subjective dyspnea, an arterial      blood gas was obtained to further evaluate her pCO2 and  pO2 levels.      Her blood gas was relatively normal with a pH of 7.460, pCO2 of 41,      and a pO2 of 83.  All diagnostic testing was unrevealing.  She had      no evidence of congestive heart failure by two-dimensional      echocardiogram and her BNP values were not elevated.  She had no      evidence of pulmonary embolism despite a mildly elevated D-dimer.      It was felt that her subjective dyspnea was likely due to obesity      hypoventilation syndrome and possibly some overlying anxiety.  She      also has a possible component of normocytic anemia contributing.      The patient will be referred to Dr. Mikeal Hawthorne and should be evaluated      further with an outpatient sleep study if indicated.  2. Hypertension:  Patient's blood pressure was well controlled      throughout her hospital stay.  3. Diabetes:  Patient's hemoglobin A1c is 8.6% indicating suboptimal      outpatient control.  This corresponds to a mean plasma glucose of      approximately 203.  She is encouraged to follow a diabetic diet and      to follow up with her primary care doctor for further  diabetes      management.  4. Chronic renal insufficiency:  Patient does have a mild chronic      renal insufficiency.  Creatinine did rise with diuresis.  She was      empirically diuresed because of her initial chest x-ray findings.      The Lasix was subsequently discontinued and her renal function will      likely return to normal.  5. Anxiety:  Patient does have significant anxiety and psychosocial      stressors.  We have arranged for social worker to talk to her prior      to discharge to help with any discharge needs that are identified.  6. Dyslipidemia:  Patient is treated with statin therapy.  She has      good control of her lipids given her total cholesterol of 134,      triglycerides 77, HDL 65, and LDL 54.  7. Normocytic anemia:  Patient will need further evaluation by her      primary care physician as she is 75 and should have colon cancer      screening at this age.   DISPOSITION:  Patient is medically stable and will be discharged home.  She requests a referral to a primary care physician and has been  provided with Dr. Shon Baton telephone number and instructed to call  him for an appointment time.  She is further instructed to resume her  metformin, October 03, 2008, forty-eight hours after she received  contrast for her CT scan.   TIME SPENT COORDINATING CARE FOR DISCHARGE AND DISCHARGE INSTRUCTIONS:  Equals 35 minutes.      Hillery Aldo, M.D.  Electronically Signed     CR/MEDQ  D:  10/01/2008  T:  10/01/2008  Job:  [id number]   cc:   Lonia Blood, M.D.

## 2011-01-31 NOTE — H&P (Signed)
[name], [name] NO.:  [id number]   MEDICAL RECORD NO.:  [id number]          PATIENT TYPE:  EMS   LOCATION:  ED                           FACILITY:  [facility]   PHYSICIAN:  Lucita Ferrara, MD         DATE OF BIRTH:  February 25, 1956   DATE OF ADMISSION:  09/29/2008  DATE OF DISCHARGE:                              HISTORY & PHYSICAL   PRIMARY CARE PHYSICIAN:  The patient is from [place].  She sees Dr.  Welton Flakes for her primary care needs.  She does not have a physician here in  the [facility] system or [place].   HISTORY OF PRESENT ILLNESS:  The patient is a 55 year old African  American, obese female with past medical history significant for  obstructive sleep apnea who presents to [facility] with  dyspnea and shortness of breath that has been ongoing for the last 2  weeks, but getting progressively worse in the last one week.  Shortness  of breath accompanied by 2-3 pillow orthopnea, lower extremity edema.  She has never had symptoms such as this before.  There is no dietary  indiscretions which she describes.  She denies a history of congestive  heart failure.  She has had stress test in the past as she describes  prior to surgery.  She says it has been negative.  She had some nausea  but no vomiting.  No abdominal pain.  She is a known diabetic.  She  actually denies any chest pain.  She denies any focal neurological  deficits.  She denies any diarrhea.   PAST MEDICAL HISTORY:  1. Hypertension.  2. Diabetes type 2.  3. Obstructive sleep apnea on CPAP per description.  She is compliant      with her CPAP machine.  4. Renal insufficiency per documentation.   ALLERGIES:  NO KNOWN DRUG ALLERGIES.   MEDICATIONS AT HOME:  1. Altace  2. Hydrochlorothiazide.  3. Lantus.  4. Metformin.  5. NovoLog sliding scale insulin.   SOCIAL HISTORY:  The patient denies drugs, alcohol or tobacco.  She is a  full-time Archivist at the [employer]   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  She denies any drugs,  alcohol or tobacco as above.   FAMILY HISTORY:  Mother died of congestive heart failure complications  that she describes at age 19.  She has no known information about her  father.  Sister is 39 and has asthma, but no other medical problems.  Brother is healthy.   REVIEW OF SYSTEMS:  As per HPI and otherwise negative.   PHYSICAL EXAMINATION:  GENERAL:  The patient is a morbidly obese African  American female in no acute distress.  HEENT:  Normocephalic, atraumatic.  Sclerae is anicteric.  NECK:  Supple.  No JVD, no carotid bruits.  PERLA.  Extraocular  movements intact.  CARDIOVASCULAR:  S1-S2 regular rate and rhythm.  No murmurs, rubs,  clicks.  LUNGS:  Clear to auscultation bilaterally.  No rhonchi, rales or  wheezes.  ABDOMEN:  Obese,  soft, nontender, nondistended.  Positive bowel sounds.  EXTREMITIES:  There is no clubbing.  There is no cyanosis.  There is  trace edema in her ankles.  NEUROLOGICAL:  Patient is oriented x3.  Cranial nerves II-XII grossly  intact.  PSYCHIATRIC:  The patient started crying after asking about family  history.  She states that her mother died of congestive heart failure in  1997, and I think an anniversary is around the corner.  EKG shows normal  sinus rhythm.  No ST-T wave changes.  Rate 63.   LABORATORY DATA:  Cardiac markers negative.  CBC shows a hemoglobin 9.8,  hematocrit 30.6, MCV of 81.1.  Urinalysis is essentially negative.  Chest x-ray shows mild congestive heart failure.   ASSESSMENT:  The patient is a 52-year with:  1. Dyspnea, likely multifactorial in etiology secondary to:      a.     Mild congestive heart failure.      b.     Obesity hypoventilation concomitant with obstructive sleep       apnea.      c.     Anxiety.  2. Diabetes type 2.  3. Hypertension.  4. Nausea, but no vomiting.   DISCUSSION:  The patient will be admitted to the  telemetry unit.  Will  initiate gentle diuresis with Lasix.  Monitor renal function.  Chest x-  ray will get a baseline beta natruretic peptide. Will proceed with a 2-D  echocardiogram, check a TSH and cycle her cardiac enzymes.  Will  continue with CPAP at night.  Will check hemoglobin A1c.  Continue with  sliding scale insulin.  Continue with metformin adjust medications as  appropriate to get better control her diabetes.  Will get a nocturnal  pulse oximetry and ABGs and if her pCO2 higher than 10 at night then  consistent with hypoventilation.  We may need to adjust her CPAP  appropriately.  Zofran for nausea, DVT and GI prophylaxis with Lovenox  and Protonix.  Rest of plans are dependent on her progress.      Lucita Ferrara, MD  Electronically Signed     RR/MEDQ  D:  09/29/2008  T:  09/29/2008  Job:  [id number]

## 2011-02-03 NOTE — H&P (Signed)
NAME:  [name]                         ACCOUNT NO.:  [id number]   MEDICAL RECORD NO.:  [id number]                   PATIENT TYPE:  INP   LOCATION:  1844                                 FACILITY:  [facility]   PHYSICIAN:  Rosanne Sack, M.D.         DATE OF BIRTH:  Jun 21, 1956   DATE OF ADMISSION:  11/28/2002  DATE OF DISCHARGE:                                HISTORY & PHYSICAL   PROBLEM LIST:  1. Uncontrolled type 1 diabetes mellitus, rule out diabetic ketoacidosis.     a. History of diabetic ketoacidosis in 1999.     b. Recent prednisone therapy, rule out infection versus other etiologies.  2. Dehydration.  3. Uncontrolled hypertension.  4. Acute renal insufficiency and hypokalemia secondary to problem #1,     laboratory data pending.  5. Right C5, C6 cervical disk herniation.     a. Anterior cervical diskectomy and arthrodesis, scheduled for next week.   CHIEF COMPLAINT:  Generalized weakness and shoulder pain.   HISTORY OF PRESENT ILLNESS:  [name] is a very pleasant 55 year old  female from [place].  Referred to our service by Dr. Wyn Quaker for  admission, due to uncontrolled diabetes mellitus.  The patient has had type  1 diabetes mellitus since 1999, but she was diagnosed with gestational  diabetes in 1.  Apparently, in 1999, she was admitted to [facility] with DKA.  The patient had been referred to Dr. Ezzard Standing due to a C5-  6 cervical disk herniation.  The patient has been treated with multiple  antiinflammatory medications as well as oxycodone.  The patient was treated  with prednisone therapy which she finished about two weeks ago.   Mrs. Montee describes significant nocturia, polyuria, polydipsia,  generalized weakness, decreased appetite, lethargy and lightheadedness  standing up, for about three weeks.  The symptoms have gotten worse over the  last week.  She denies fever, questionable chills, no cough, no hemoptysis,  no shortness  of breath, no chest pain, no anginal symptoms, no orthopnea.  No lower extremity swelling, no swallowing problems, no PND, no orthopnea,  no dyspnea on exertion, no postnasal drip.  No focal weakness, no syncope,  no melena, tarry stools or bright red blood per rectum.  No dysuria, no  hematuria.  No skin rash.  No headaches.  The patient describes right  shoulder pain that starts in the mid-upper back and radiates to the forearm.   PAST MEDICAL HISTORY:  As per problem list.   ALLERGIES:  None.   MEDICATIONS:  1. Lantus 25 units q.p.m.  2. Novolin sliding-scale insulin before meals.  3. Vioxx 25 mg p.o. daily.  4. Furosemide 20 mg p.o. daily.  5. Antivert 20 mg p.o. daily.  6. Cyclobenzaprine 10 mg p.o. t.i.d.  7. OxyContin 5 mg p.o. b.i.d.   FAMILY HISTORY:  The patient's mother had diabetes, hypertension and some  type of cardiomyopathy with a sudden heart attack  at the age of 62 and a  sister dying at age of 45 from acute renal failure.  Patient denies  malignancy or strokes in the family.   SOCIAL HISTORY:  Patient is single.  She has a daughter who is 50 years old.  She denies alcohol or drug use.  She is a Geophysicist/field seismologist.   REVIEW OF SYSTEMS:  As per HPI.   PHYSICAL EXAMINATION:  VITAL SIGNS:  Temperature 98.2, blood pressure  159/88, heart rate 117, respiration rate 16.  Oxygen saturation 100% on room  air.  HEENT:  Normocephalic, atraumatic, nonicteric sclerae, conjunctivae within  normal limits.  PERRLA, EOMI.  Funduscopic examination no papilledema or  hemorrhages.  Very dry mucous membranes.  TM's within normal limits.  Oropharynx clear.  NECK:  Supple, no JVD, bruits, adenopathy or thyromegaly.  LUNGS:  Clear to auscultation bilaterally without crackles or wheezes.  Fair  air movement bilaterally.  CARDIAC:  Tachycardic, no murmurs, rubs or gallops.  Normal S1, S2.  ABDOMEN:  Slightly obese, nontender, nondistended, bowel sounds were  present.  No  hepatosplenomegaly.  No rebound, no masses, no bruits, no  guarding.  BREASTS:  Within normal limits.  GENITOURINARY:  Within normal limits.  RECTAL:  Normal sphincter tone, no masses.  EXTREMITIES:  No edema, clubbing or cyanosis.  Pulses 2+ bilaterally.  NEUROLOGIC:  Alert and oriented x3.  Strength 5/5 in lower extremities.  DTR's 3/5 in lower extremities.  Cranial nerves II-XII intact.  Sensory  intact.  Plantar reflexes downgoing bilaterally.   LABORATORY DATA:  Pending.   PROBLEMS AND PLAN:  1. Uncontrolled type 1 diabetes mellitus, rule out diabetic ketoacidosis-     ___________ 540.  Although the patient does not have any nausea,     vomiting, diarrhea or signs of uncontrolled infection, diabetic     ketoacidosis still is possible.  There is no evidence of myocardial     infarction, anginal symptoms or stroke.  I suspect that the recent     steroid therapy along with a lack of diabetic control, noncompliance with     diet and treatment, seems to be the most likely etiologies for this     uncontrolled diabetes mellitus.  Plan is to admit the patient to a     regular bed.  A Glucometer protocol will be started.  Will obtain an ABG     and basic labs to rule out DKA.  A UA and a chest x-ray will be obtained     to rule out infection.  Hemoglobin A1C along with TSH will be obtained     today too.  At this point, there is no need for blood cultures.  No need     for EKG.  2. Dehydration.  There is severe evidence of dehydration.  I believe that     the uncontrolled hyperglycemia, osmotic diuresis along with a diuretic     agent are likely culprits for this dehydration.  The lab data is pending.     The patient is tachycardic.  Will start IV fluids and follow closely the     fluid problems daily.  3. Uncontrolled hypertension.  The blood pressure is 159/88.  Will hold     furosemide due to the dehydration.  Will start on Norvasc for now,    although an ACE inhibitor or an ARB  should be a better choice for blood     pressure control given the patients diabetes.  These agents could  be     considered once the creatinine values are obtained and stable.  4. Probable acute renal insufficiency and hyperkalemia.  Given the     presentation of problem #1, suspect that this is     going to be also the findings in the lab data that currently is pending.     Will act accordingly to the lab findings.  5. C5-C6 cervical disk herniation.  For now, will provide with pain     management along with the muscle relaxers.  Our goal is to achieve     perfect glycemic control so the surgery can be performed next week.                                                  Rosanne Sack, M.D.    JM/MEDQ  D:  11/28/2002  T:  11/28/2002  Job:  [id number]   cc:   Andreas Blower, M.D.   Hassan Rowan, M.D.  [employer]

## 2011-02-03 NOTE — Op Note (Signed)
NAME:  [name], [name]                         ACCOUNT NO.:  [id number]   MEDICAL RECORD NO.:  [id number]                   PATIENT TYPE:  INP   LOCATION:  3172                                 FACILITY:  [facility]   PHYSICIAN:  Hewitt Shorts, M.D.            DATE OF BIRTH:  1956-01-16   DATE OF PROCEDURE:  12/01/2002  DATE OF DISCHARGE:                                 OPERATIVE REPORT   PREOPERATIVE DIAGNOSES:  1. C5-6 cervical disk herniation.  2. Cervical stenosis.  3. Cervical spondylosis.  4. Cervical degenerative disk disease.   POSTOPERATIVE DIAGNOSES:  1. C5-6 cervical disk herniation.  2. Cervical stenosis.  3. Cervical spondylosis.  4. Cervical degenerative disk disease.   PROCEDURE:  C5-6 anterior cervical diskectomy with arthrodesis, with VG-2  allograft and Trinica cervical plating.   SURGEON:  Hewitt Shorts, M.D.   ASSISTANT:  Cristi Loron, M.D.   ANESTHESIA:  General endotracheal.   INDICATIONS FOR PROCEDURE:  The patient is a 55 year old woman who presented  with disabling intrascapular and neck pain extending into the right  shoulder, arm, and forearm.  She had a large C5-6 cervical disk herniation  with cervical spinal stenosis and cord compression.  The decision was made  to proceed with an elective anterior cervical diskectomy and arthrodesis.   DESCRIPTION OF PROCEDURE:  The patient was brought to the operating room and  placed under general endotracheal anesthesia.  The patient was placed in 10  pounds of Holter traction.  The neck was prepped with Betadine soap and  solution and draped in a sterile fashion.  A horizontal incision was made in  the left side of the neck.  The line of the incision was infiltrated with  infiltrated with local anesthetic with epinephrine.  The dissection was  carried down through the subcutaneous tissue and platysma.  Bipolar cautery  was used to make sure of hemostasis.  The dissection was then carried  out  through an avascular plane leaving the sternocleidomastoid, carotid artery  and jugular vein laterally, and the tracheal esophageal medially.  The  central aspect of the antecubital cartilage was identified.  A localizing x-  ray was taken, and the C5-6 intervertebral disk space was identified.  A  diskectomy was begun with an incision in the annulus, and continued with  micro curets and pituitary rongeurs.  The cartilaginous endplates of the  corresponding vertebrae were removed using micro curets, as well as the  Micro Max drill.  The microscope was then draped and brought into the field  to provide illumination and visualization.  The remainder of the dissection  was performed using micro dissection and microsurgical technique.  The  posterior osteophyte overgrowth was removed using the Micro Max drill, as  well as a 2 mm Kerrison punch and a thin foot plate.  There was a large disk  herniation encountered.  This was removed in a piece-meal  fashion.  The  posterior longitudinal ligament was removed.  We were able to decompress the  spinal canal and thecal sac.  Using a micro hook, we were able to probe the  tear in the bodies of C5 and C6, to mobilize additional fragments.  In the  end, a good decompression of the thecal sac was achieved.  Once the  decompression was completed, we accomplished hemostasis with the use of  Gelfoam soaked in thrombin, and then proceeded with the arthrodesis.  We  selected an 8 mm x 6 mm graft of VG-2 allograft.  It was soaked in  antibiotic solution and then positioned in the intervertebral disk space and  counter-sunk.  We then selected a 22 mm Trinica cervical plate, which was  positioned over the fusion construct.  This was secured to each of the  vertebra with a pair of 4.2 x 14.0 mm screws.  Each screw hole was drilled  and tapped, and the screws were placed in an alternating fashion.  Traction  was discontinued.  The locking system was secured  and x-rays taken which  showed the graft, plate and screws were all found to be in good position and  secure.  Then we proceeded with a closure of the wound.  The wound was  irrigated with antibiotic solution.  Checked for hemostasis.  This was  established and confirmed.  Then the platysma was closed with interrupted  inverted #2-0 undyed Vicryl suture, the subcutaneous and subcuticular closed  with interrupted inverted #3-0 undyed Vicryl suture, and the skin is  approximated with DermaBond.   The patient tolerated the procedure well.  The estimated blood loss was 50  mL.  The sponge and needle counts were correct.   Following surgery the patient was reversed from anesthetic, extubated, and  transferred to the recovery room for further care, where she was noted to be  moving all four extremities.                                                Hewitt Shorts, M.D.    RWN/MEDQ  D:  12/01/2002  T:  12/01/2002  Job:  [id number]

## 2011-02-03 NOTE — Discharge Summary (Signed)
NAME:  [name], [name]                         ACCOUNT NO.:  [id number]   MEDICAL RECORD NO.:  [id number]                   PATIENT TYPE:  INP   LOCATION:  3019                                 FACILITY:  [facility]   PHYSICIAN:  Hewitt Shorts, M.D.            DATE OF BIRTH:  [birth year]   DATE OF ADMISSION:  11/28/2002  DATE OF DISCHARGE:  12/02/2002                                 DISCHARGE SUMMARY   HISTORY OF PRESENT ILLNESS:  The patient is a 55 year old woman with a  history of diabetes mellitus, who had presented with disabling intrascapular  and neck pain with pain into the right shoulder, arm, and forearm.  MRI scan  was found to have a large C5-6 cervical disk herniation with spinal cord  compression and spinal stenosis.  The patient was scheduled for elective  surgery yesterday, however, at the time of evaluation four days ago in the  office, preoperative laboratories revealed a blood sugar of 543 and  therefore it was felt necessary to have the patient admitted to the medical  service for management of her diabetes.  Juan-Carlos Monguilod, M.D., was  kind enough to admit the patient for management of uncontrolled type 1  diabetes mellitus.  She had a history of four years ago of diabetic  ketoacidosis.  At the time of admission, she described significant nocturia,  polyuria, polydipsia, etc.   MEDICATIONS:  Medications prior to admission included:  1. Lantus 25 mg q.p.m.  2. Novolog on a sliding scale.   PHYSICAL EXAMINATION:  The general examination revealed obesity.  The  neurologic examination was intact.   HOSPITAL COURSE:  The patient was admitted by Rosanne Sack, M.D.,  and found to have uncontrolled type 1 diabetes mellitus, as well as  dehydration.  The patient's hyperglycemia was treated and brought under  control.  The hemoglobin A1C was 12.4, which Dr. Jamie Brookes explained  indicates that she typically is running a serum blood sugar in the 300-400  range.  However, the patient was prepared for surgery and taken to surgery  yesterday.  She underwent a C5-6 anterior cervical diskectomy and  arthrodesis with VG2 allograft and Trinica cervical plating.  Following  surgery, she had excellent relief of her neck and intrascapular pain.  She  is afebrile.  She is up and ambulating.  The wound is healing well.  We are  discharging her to home with instructions regarding wound care and  activities following discharge.   DISCHARGE MEDICATIONS:  She has been given a prescription for Vicodin one or  two tablets p.o. q.4-6h. p.r.n. pain, 40 tablets prescribed with no refills.  We also advised the use of Aleve two tablets b.i.d.   FOLLOW-UP:  She is to return to my office in three weeks for follow-up and  have a cervical spine x-ray done then.   DISPOSITION:  The patient will be discharged after she has had an  opportunity to be seen by Rosanne Sack, M.D., who will give her  updated instruction regarding her diabetes management.  It has also been  explained to her though that she does need to follow up with her primary  physician, Dr. Thurmond Butts, and it is imperative that she follow a diabetic diet  and much more carefully manage her diabetes.   DISCHARGE DIAGNOSES:  1. Uncontrolled type 1 diabetes mellitus.  2.     Dehydration.  3. Cervical disk herniation.  4. Cervical stenosis.                                               Hewitt Shorts, M.D.    RWN/MEDQ  D:  12/02/2002  T:  12/02/2002  Job:  [id number]   cc:   Rosanne Sack, M.D.  [address]  [place], [place] [address]  Fax: [phone]

## 2011-02-27 ENCOUNTER — Ambulatory Visit: Payer: Self-pay | Attending: Internal Medicine | Admitting: Physical Therapy

## 2011-02-27 DIAGNOSIS — M25569 Pain in unspecified knee: Secondary | ICD-10-CM | POA: Insufficient documentation

## 2011-02-27 DIAGNOSIS — M25669 Stiffness of unspecified knee, not elsewhere classified: Secondary | ICD-10-CM | POA: Insufficient documentation

## 2011-02-27 DIAGNOSIS — IMO0001 Reserved for inherently not codable concepts without codable children: Secondary | ICD-10-CM | POA: Insufficient documentation

## 2011-03-06 ENCOUNTER — Ambulatory Visit: Payer: Self-pay | Admitting: Physical Therapy

## 2011-03-10 ENCOUNTER — Ambulatory Visit: Payer: Self-pay | Admitting: Physical Therapy

## 2011-03-14 ENCOUNTER — Ambulatory Visit: Payer: Self-pay | Admitting: Physical Therapy

## 2011-03-16 ENCOUNTER — Ambulatory Visit: Payer: Self-pay | Admitting: Physical Therapy

## 2011-03-29 ENCOUNTER — Ambulatory Visit: Payer: Self-pay | Attending: Internal Medicine | Admitting: Physical Therapy

## 2011-03-29 ENCOUNTER — Encounter: Payer: Self-pay | Admitting: Physical Therapy

## 2011-03-29 DIAGNOSIS — M25569 Pain in unspecified knee: Secondary | ICD-10-CM | POA: Insufficient documentation

## 2011-03-29 DIAGNOSIS — IMO0001 Reserved for inherently not codable concepts without codable children: Secondary | ICD-10-CM | POA: Insufficient documentation

## 2011-03-29 DIAGNOSIS — M25669 Stiffness of unspecified knee, not elsewhere classified: Secondary | ICD-10-CM | POA: Insufficient documentation

## 2011-03-31 ENCOUNTER — Encounter: Payer: Self-pay | Admitting: Physical Therapy

## 2011-04-05 ENCOUNTER — Ambulatory Visit: Payer: Self-pay | Admitting: Physical Therapy

## 2011-04-06 ENCOUNTER — Encounter: Payer: Self-pay | Admitting: Physical Therapy

## 2011-04-10 ENCOUNTER — Ambulatory Visit: Payer: Self-pay | Admitting: Physical Therapy

## 2011-04-11 ENCOUNTER — Encounter: Payer: Self-pay | Admitting: Physical Therapy

## 2011-04-11 ENCOUNTER — Ambulatory Visit: Payer: Self-pay | Admitting: Physical Therapy

## 2011-04-19 ENCOUNTER — Encounter: Payer: Self-pay | Admitting: Physical Therapy

## 2011-04-21 ENCOUNTER — Ambulatory Visit: Payer: Self-pay | Attending: Internal Medicine | Admitting: Physical Therapy

## 2011-04-21 DIAGNOSIS — M25569 Pain in unspecified knee: Secondary | ICD-10-CM | POA: Insufficient documentation

## 2011-04-21 DIAGNOSIS — IMO0001 Reserved for inherently not codable concepts without codable children: Secondary | ICD-10-CM | POA: Insufficient documentation

## 2011-04-24 ENCOUNTER — Ambulatory Visit: Payer: Self-pay | Admitting: Physical Therapy

## 2011-04-26 ENCOUNTER — Ambulatory Visit: Payer: Self-pay | Admitting: Physical Therapy

## 2011-05-01 ENCOUNTER — Ambulatory Visit: Payer: Self-pay | Admitting: Physical Therapy

## 2011-05-04 ENCOUNTER — Ambulatory Visit: Payer: Self-pay | Admitting: Physical Therapy

## 2011-05-09 ENCOUNTER — Ambulatory Visit: Payer: Self-pay | Admitting: Physical Therapy

## 2011-05-10 ENCOUNTER — Ambulatory Visit: Payer: Self-pay | Admitting: Physical Therapy

## 2011-05-11 ENCOUNTER — Encounter: Payer: Self-pay | Admitting: Physical Therapy

## 2011-05-17 ENCOUNTER — Encounter: Payer: Self-pay | Admitting: Physical Therapy

## 2011-05-18 ENCOUNTER — Encounter: Payer: Self-pay | Admitting: Physical Therapy

## 2011-11-06 ENCOUNTER — Other Ambulatory Visit: Payer: Self-pay | Admitting: Internal Medicine

## 2011-11-06 DIAGNOSIS — M25562 Pain in left knee: Secondary | ICD-10-CM

## 2011-11-09 ENCOUNTER — Other Ambulatory Visit (HOSPITAL_COMMUNITY): Payer: Self-pay

## 2011-11-17 ENCOUNTER — Other Ambulatory Visit (HOSPITAL_COMMUNITY): Payer: Self-pay

## 2011-11-17 ENCOUNTER — Encounter: Payer: Self-pay | Attending: Family Medicine | Admitting: *Deleted

## 2011-11-17 ENCOUNTER — Encounter: Payer: Self-pay | Admitting: *Deleted

## 2011-11-17 ENCOUNTER — Ambulatory Visit (HOSPITAL_COMMUNITY)
Admission: RE | Admit: 2011-11-17 | Discharge: 2011-11-17 | Disposition: A | Discharge status: Home / Self Care | Payer: Self-pay | Source: Ambulatory Visit | Attending: Internal Medicine | Admitting: Internal Medicine

## 2011-11-17 DIAGNOSIS — M7120 Synovial cyst of popliteal space [Baker], unspecified knee: Secondary | ICD-10-CM | POA: Insufficient documentation

## 2011-11-17 DIAGNOSIS — E119 Type 2 diabetes mellitus without complications: Secondary | ICD-10-CM | POA: Insufficient documentation

## 2011-11-17 DIAGNOSIS — Z713 Dietary counseling and surveillance: Secondary | ICD-10-CM | POA: Insufficient documentation

## 2011-11-17 DIAGNOSIS — M25569 Pain in unspecified knee: Secondary | ICD-10-CM | POA: Insufficient documentation

## 2011-11-17 DIAGNOSIS — M25469 Effusion, unspecified knee: Secondary | ICD-10-CM | POA: Insufficient documentation

## 2011-11-17 DIAGNOSIS — M25562 Pain in left knee: Secondary | ICD-10-CM

## 2011-11-19 ENCOUNTER — Encounter: Payer: Self-pay | Admitting: *Deleted

## 2011-11-19 NOTE — Progress Notes (Signed)
Medical Nutrition Therapy:  Appt start time: 11:15  End time: 12:00.  Assessment:  Primary concerns today: T2DM, new referral.  Established pt of Maggie May's returns on new referral for continued DM management. Recent A1c unknown. Pt reports recent FBGs of 110-200+ mg. Pt also reports several recent episodes of hypoglycemia, likely d/t reported meal skipping. States she has blurry vision today. Main focus on dietary recommendations d/t lack of time. Has maintained 23 lb weight loss since 09/2010. Will have pt f/u with Maggie May in 2 weeks.   MEDICATIONS: See medication list.   Usual physical activity: None  Dietary Recall: Unable to obtain d/t time constraints.  Estimated energy needs: 1200 calories 135 g carbohydrates  Progress Towards Goal(s):  In progress.   Nutritional Diagnosis:  [place]-2.1 Impaired nutrient utilization related to glucose metabolism as evidenced by inconsistent dietary intake pattern/meal skipping and MD referral for additional MNT for T2DM.    Intervention:  Nutrition education (See patient goals).  Handouts given during visit include:  Living Well with Diabetes - [employer]  Target Blood Glucose Goals  Snack List  Samples Dispensed:  Boost Glucose Control: 3 bottles Lot #: [id number]; Exp: 10/09/12  Monitoring/Evaluation:  Dietary intake, exercise, A1c (as available), BG trends, and body weight in 2 week(s).

## 2011-11-19 NOTE — Patient Instructions (Signed)
Goals:  Follow Diabetes Meal Plan as instructed (see yellow card).  Eat 3 meals and 2 snacks, every 3-5 hrs.  **AVOID meal skipping - For short term, try a Boost Glucose Control or Calorie Smart (or  equivalent) to retrain appetite.  Add lean protein foods to all meals/snacks.  Monitor glucose levels as instructed by your doctor.  Aim for 20-30 mins of physical activity daily as able per MD.  Bring glucose log to your next nutrition visit.

## 2011-12-14 ENCOUNTER — Encounter (HOSPITAL_COMMUNITY): Payer: Self-pay

## 2011-12-14 ENCOUNTER — Emergency Department (HOSPITAL_COMMUNITY)
Admission: EM | Admit: 2011-12-14 | Discharge: 2011-12-15 | Disposition: A | Discharge status: Home / Self Care | Payer: Self-pay | Attending: Emergency Medicine | Admitting: Emergency Medicine

## 2011-12-14 ENCOUNTER — Other Ambulatory Visit: Payer: Self-pay

## 2011-12-14 ENCOUNTER — Emergency Department (HOSPITAL_COMMUNITY): Payer: Self-pay

## 2011-12-14 DIAGNOSIS — J069 Acute upper respiratory infection, unspecified: Secondary | ICD-10-CM

## 2011-12-14 DIAGNOSIS — E785 Hyperlipidemia, unspecified: Secondary | ICD-10-CM | POA: Insufficient documentation

## 2011-12-14 DIAGNOSIS — R0602 Shortness of breath: Secondary | ICD-10-CM | POA: Insufficient documentation

## 2011-12-14 DIAGNOSIS — R739 Hyperglycemia, unspecified: Secondary | ICD-10-CM

## 2011-12-14 DIAGNOSIS — J40 Bronchitis, not specified as acute or chronic: Secondary | ICD-10-CM

## 2011-12-14 DIAGNOSIS — E119 Type 2 diabetes mellitus without complications: Secondary | ICD-10-CM | POA: Insufficient documentation

## 2011-12-14 DIAGNOSIS — J45909 Unspecified asthma, uncomplicated: Secondary | ICD-10-CM | POA: Insufficient documentation

## 2011-12-14 DIAGNOSIS — G4733 Obstructive sleep apnea (adult) (pediatric): Secondary | ICD-10-CM | POA: Insufficient documentation

## 2011-12-14 DIAGNOSIS — I10 Essential (primary) hypertension: Secondary | ICD-10-CM

## 2011-12-14 DIAGNOSIS — Z794 Long term (current) use of insulin: Secondary | ICD-10-CM | POA: Insufficient documentation

## 2011-12-14 DIAGNOSIS — Z79899 Other long term (current) drug therapy: Secondary | ICD-10-CM | POA: Insufficient documentation

## 2011-12-14 LAB — CBC
HCT: 33.4 % — ABNORMAL LOW (ref 36.0–46.0)
Hemoglobin: 10.6 g/dL — ABNORMAL LOW (ref 12.0–15.0)
MCHC: 31.7 g/dL (ref 30.0–36.0)
MCV: 80.7 fL (ref 78.0–100.0)
RDW: 14.5 % (ref 11.5–15.5)

## 2011-12-14 LAB — DIFFERENTIAL
Basophils Absolute: 0 10*3/uL (ref 0.0–0.1)
Basophils Relative: 0 % (ref 0–1)
Eosinophils Relative: 1 % (ref 0–5)
Lymphocytes Relative: 16 % (ref 12–46)
Monocytes Absolute: 0.6 10*3/uL (ref 0.1–1.0)
Monocytes Relative: 8 % (ref 3–12)
Neutro Abs: 5.6 10*3/uL (ref 1.7–7.7)

## 2011-12-14 LAB — BASIC METABOLIC PANEL
BUN: 8 mg/dL (ref 6–23)
CO2: 25 mEq/L (ref 19–32)
Calcium: 8.4 mg/dL (ref 8.4–10.5)
Chloride: 101 mEq/L (ref 96–112)
Creatinine, Ser: 0.98 mg/dL (ref 0.50–1.10)

## 2011-12-14 LAB — GLUCOSE, CAPILLARY

## 2011-12-14 LAB — POCT I-STAT TROPONIN I: Troponin i, poc: 0.03 ng/mL (ref 0.00–0.08)

## 2011-12-14 MED ORDER — RAMIPRIL 10 MG PO CAPS
10.0000 mg | ORAL_CAPSULE | Freq: Every day | ORAL | Status: DC
  Administered 2011-12-14: 10 mg via ORAL
  Filled 2011-12-14 (×3): qty 1

## 2011-12-14 MED ORDER — HYDROCOD POLST-CHLORPHEN POLST 10-8 MG/5ML PO LQCR
5.0000 mL | Freq: Once | ORAL | Status: AC
  Administered 2011-12-14: 5 mL via ORAL
  Filled 2011-12-14: qty 5

## 2011-12-14 MED ORDER — INSULIN ASPART 100 UNIT/ML ~~LOC~~ SOLN
6.0000 [IU] | Freq: Once | SUBCUTANEOUS | Status: AC
  Administered 2011-12-14: 100 [IU] via SUBCUTANEOUS

## 2011-12-14 MED ORDER — IRBESARTAN 300 MG PO TABS
300.0000 mg | ORAL_TABLET | Freq: Every day | ORAL | Status: DC
  Administered 2011-12-14: 300 mg via ORAL
  Filled 2011-12-14: qty 1

## 2011-12-14 MED ORDER — INSULIN REGULAR HUMAN 100 UNIT/ML IJ SOLN: 6.0000 [IU] | Freq: Once | INTRAMUSCULAR | Status: DC

## 2011-12-14 MED ORDER — SODIUM CHLORIDE 0.9 % IV BOLUS (SEPSIS): 1000.0000 mL | Freq: Once | INTRAVENOUS | Status: DC

## 2011-12-14 MED ORDER — ALBUTEROL SULFATE (5 MG/ML) 0.5% IN NEBU
5.0000 mg | INHALATION_SOLUTION | Freq: Once | RESPIRATORY_TRACT | Status: AC
  Administered 2011-12-14: 5 mg via RESPIRATORY_TRACT
  Filled 2011-12-14: qty 1

## 2011-12-14 MED ORDER — RAMIPRIL 10 MG PO TABS: 10.0000 mg | ORAL_TABLET | Freq: Every day | ORAL | Status: DC

## 2011-12-14 MED ORDER — TELMISARTAN-HCTZ 80-25 MG PO TABS: 1.0000 | ORAL_TABLET | Freq: Every day | ORAL | Status: DC

## 2011-12-14 MED ORDER — HYDROCHLOROTHIAZIDE 25 MG PO TABS
25.0000 mg | ORAL_TABLET | Freq: Every day | ORAL | Status: DC
  Administered 2011-12-14: 25 mg via ORAL
  Filled 2011-12-14 (×3): qty 1

## 2011-12-14 MED ORDER — IPRATROPIUM BROMIDE 0.02 % IN SOLN
0.5000 mg | Freq: Once | RESPIRATORY_TRACT | Status: AC
  Administered 2011-12-14: 0.5 mg via RESPIRATORY_TRACT
  Filled 2011-12-14: qty 2.5

## 2011-12-14 NOTE — ED Notes (Signed)
Pt assessed by T. Soyars, Charity fundraiser

## 2011-12-14 NOTE — ED Notes (Signed)
Pt complains of being short of breath and a cough since Tuesday, she also states that she has general body aches and she vomited earlier today

## 2011-12-14 NOTE — ED Notes (Signed)
RT at bedside for neb tx

## 2011-12-14 NOTE — ED Notes (Signed)
Pt is C/O cough, chills, headache, body aches since Tuesday.  [name]. RN

## 2011-12-15 ENCOUNTER — Ambulatory Visit: Payer: Self-pay | Admitting: *Deleted

## 2011-12-15 LAB — GLUCOSE, CAPILLARY: Glucose-Capillary: 208 mg/dL — ABNORMAL HIGH (ref 70–99)

## 2011-12-15 MED ORDER — LEVOFLOXACIN 500 MG PO TABS: 500.0000 mg | ORAL_TABLET | Freq: Every day | ORAL | Status: AC

## 2011-12-15 MED ORDER — ACETAMINOPHEN 325 MG PO TABS
650.0000 mg | ORAL_TABLET | Freq: Once | ORAL | Status: AC
  Administered 2011-12-15: 650 mg via ORAL
  Filled 2011-12-15: qty 2

## 2011-12-15 NOTE — ED Notes (Signed)
Pt attempting to call daughter for ride home.

## 2011-12-15 NOTE — ED Notes (Signed)
Pt Ox. 94% walking.

## 2011-12-15 NOTE — ED Notes (Signed)
Patient called and asked for a change of RX because it was too expensive and she had no insurance. Sent to EDP> Reviewed by Felicie Morn [facility] who found coupon for Levaquin to be $11.59. RX called in to [facility] on [address] by Steward Ros [facility].

## 2011-12-15 NOTE — Discharge Instructions (Signed)
Bronchitis Bronchitis is a problem of the air tubes leading to your lungs. This problem makes it hard for air to get in and out of the lungs. You may cough a lot because your air tubes are narrow. Going without care can cause lasting (chronic) bronchitis. HOME CARE   Drink enough fluids to keep your pee (urine) clear or pale yellow.   Use a cool mist humidifier.   Quit smoking if you smoke. If you keep smoking, the bronchitis might not get better.   Only take medicine as told by your doctor.  GET HELP RIGHT AWAY IF:   Coughing keeps you awake.   You start to wheeze.   You become more sick or weak.   You have a hard time breathing or get short of breath.   You cough up blood.   Coughing lasts more than 2 weeks.   You have a fever.   Your baby is older than 3 months with a rectal temperature of 102 F (38.9 C) or higher.   Your baby is 33 months old or younger with a rectal temperature of 100.4 F (38 C) or higher.  MAKE SURE YOU:  Understand these instructions.   Will watch your condition.   Will get help right away if you are not doing well or get worse.  Document Released: 02/21/2008 Document Revised: 08/24/2011 Document Reviewed: 08/06/2009 [facility] Patient Information 2012 [place], [place].Arterial Hypertension Arterial hypertension (high blood pressure) is a condition of elevated pressure in your blood vessels. Hypertension over a long period of time is a risk factor for strokes, heart attacks, and heart failure. It is also the leading cause of kidney (renal) failure.  CAUSES   In Adults -- Over 90% of all hypertension has no known cause. This is called essential or primary hypertension. In the other 10% of people with hypertension, the increase in blood pressure is caused by another disorder. This is called secondary hypertension. Important causes of secondary hypertension are:   Heavy alcohol use.   Obstructive sleep apnea.   Hyperaldosterosim (Conn's syndrome).     Steroid use.   Chronic kidney failure.   Hyperparathyroidism.   Medications.   Renal artery stenosis.   Pheochromocytoma.   Cushing's disease.   Coarctation of the aorta.   Scleroderma renal crisis.   Licorice (in excessive amounts).   Drugs (cocaine, methamphetamine).  Your caregiver can explain any items above that apply to you.  In Children -- Secondary hypertension is more common and should always be considered.   Pregnancy -- Few women of childbearing age have high blood pressure. However, up to 10% of them develop hypertension of pregnancy. Generally, this will not harm the woman. It may be a sign of 3 complications of pregnancy: preeclampsia, HELLP syndrome, and eclampsia. Follow up and control with medication is necessary.  SYMPTOMS   This condition normally does not produce any noticeable symptoms. It is usually found during a routine exam.   Malignant hypertension is a late problem of high blood pressure. It may have the following symptoms:   Headaches.   Blurred vision.   End-organ damage (this means your kidneys, heart, lungs, and other organs are being damaged).   Stressful situations can increase the blood pressure. If a person with normal blood pressure has their blood pressure go up while being seen by their caregiver, this is often termed "white coat hypertension." Its importance is not known. It may be related with eventually developing hypertension or complications of hypertension.  Hypertension is often confused with mental tension, stress, and anxiety.  DIAGNOSIS  The diagnosis is made by 3 separate blood pressure measurements. They are taken at least 1 week apart from each other. If there is organ damage from hypertension, the diagnosis may be made without repeat measurements. Hypertension is usually identified by having blood pressure readings:  Above 140/90 mmHg measured in both arms, at 3 separate times, over a couple weeks.   Over 130/80  mmHg should be considered a risk factor and may require treatment in patients with diabetes.  Blood pressure readings over 120/80 mmHg are called "pre-hypertension" even in non-diabetic patients. To get a true blood pressure measurement, use the following guidelines. Be aware of the factors that can alter blood pressure readings.  Take measurements at least 1 hour after caffeine.   Take measurements 30 minutes after smoking and without any stress. This is another reason to quit smoking - it raises your blood pressure.   Use a proper cuff size. Ask your caregiver if you are not sure about your cuff size.   Most home blood pressure cuffs are automatic. They will measure systolic and diastolic pressures. The systolic pressure is the pressure reading at the start of sounds. Diastolic pressure is the pressure at which the sounds disappear. If you are elderly, measure pressures in multiple postures. Try sitting, lying or standing.   Sit at rest for a minimum of 5 minutes before taking measurements.   You should not be on any medications like decongestants. These are found in many cold medications.   Record your blood pressure readings and review them with your caregiver.  If you have hypertension:  Your caregiver may do tests to be sure you do not have secondary hypertension (see "causes" above).   Your caregiver may also look for signs of metabolic syndrome. This is also called Syndrome X or Insulin Resistance Syndrome. You may have this syndrome if you have type 2 diabetes, abdominal obesity, and abnormal blood lipids in addition to hypertension.   Your caregiver will take your medical and family history and perform a physical exam.   Diagnostic tests may include blood tests (for glucose, cholesterol, potassium, and kidney function), a urinalysis, or an EKG. Other tests may also be necessary depending on your condition.  PREVENTION  There are important lifestyle issues that you can adopt to  reduce your chance of developing hypertension:  Maintain a normal weight.   Limit the amount of salt (sodium) in your diet.   Exercise often.   Limit alcohol intake.   Get enough potassium in your diet. Discuss specific advice with your caregiver.   Follow a DASH diet (dietary approaches to stop hypertension). This diet is rich in fruits, vegetables, and low-fat dairy products, and avoids certain fats.  PROGNOSIS  Essential hypertension cannot be cured. Lifestyle changes and medical treatment can lower blood pressure and reduce complications. The prognosis of secondary hypertension depends on the underlying cause. Many people whose hypertension is controlled with medicine or lifestyle changes can live a normal, healthy life.  RISKS AND COMPLICATIONS  While high blood pressure alone is not an illness, it often requires treatment due to its short- and long-term effects on many organs. Hypertension increases your risk for:  CVAs or strokes (cerebrovascular accident).   Heart failure due to chronically high blood pressure (hypertensive cardiomyopathy).   Heart attack (myocardial infarction).   Damage to the retina (hypertensive retinopathy).   Kidney failure (hypertensive nephropathy).  Your caregiver can explain  list items above that apply to you. Treatment of hypertension can significantly reduce the risk of complications. TREATMENT   For overweight patients, weight loss and regular exercise are recommended. Physical fitness lowers blood pressure.   Mild hypertension is usually treated with diet and exercise. A diet rich in fruits and vegetables, fat-free dairy products, and foods low in fat and salt (sodium) can help lower blood pressure. Decreasing salt intake decreases blood pressure in a 1/3 of people.   Stop smoking if you are a smoker.  The steps above are highly effective in reducing blood pressure. While these actions are easy to suggest, they are difficult to achieve. Most  patients with moderate or severe hypertension end up requiring medications to bring their blood pressure down to a normal level. There are several classes of medications for treatment. Blood pressure pills (antihypertensives) will lower blood pressure by their different actions. Lowering the blood pressure by 10 mmHg may decrease the risk of complications by as much as 25%. The goal of treatment is effective blood pressure control. This will reduce your risk for complications. Your caregiver will help you determine the best treatment for you according to your lifestyle. What is excellent treatment for one person, may not be for you. HOME CARE INSTRUCTIONS   Do not smoke.   Follow the lifestyle changes outlined in the "Prevention" section.   If you are on medications, follow the directions carefully. Blood pressure medications must be taken as prescribed. Skipping doses reduces their benefit. It also puts you at risk for problems.   Follow up with your caregiver, as directed.   If you are asked to monitor your blood pressure at home, follow the guidelines in the "Diagnosis" section above.  SEEK MEDICAL CARE IF:   You think you are having medication side effects.   You have recurrent headaches or lightheadedness.   You have swelling in your ankles.   You have trouble with your vision.  SEEK IMMEDIATE MEDICAL CARE IF:   You have sudden onset of chest pain or pressure, difficulty breathing, or other symptoms of a heart attack.   You have a severe headache.   You have symptoms of a stroke (such as sudden weakness, difficulty speaking, difficulty walking).  MAKE SURE YOU:   Understand these instructions.   Will watch your condition.   Will get help right away if you are not doing well or get worse.  Document Released: 09/04/2005 Document Revised: 08/24/2011 Document Reviewed: 04/04/2007 [facility] Patient Information 2012 [place], [place].Cool Mist Vaporizers Vaporizers may help relieve  the symptoms of a cough and cold. By adding water to the air, mucus may become thinner and less sticky. This makes it easier to breathe and cough up secretions. Vaporizers have not been proven to show they help with colds. You should not use a vaporizer if you are allergic to mold. Cool mist vaporizers do not cause serious burns like hot mist vaporizers ("steamers"). HOME CARE INSTRUCTIONS  Follow the package instructions for your vaporizer.   Use a vaporizer that holds a large volume of water (1 to 2 gallons [5.7 to 7.5 liters]).   Do not use anything other than distilled water in the vaporizer.   Do not run the vaporizer all of the time. This can cause mold or bacteria to grow in the vaporizer.   Clean the vaporizer after each time you use it.   Clean and dry the vaporizer well before you store it.   Stop using a vaporizer if  you develop worsening respiratory symptoms.  Document Released: 06/01/2004 Document Revised: 08/24/2011 Document Reviewed: 04/29/2009 [facility] Patient Information 2012 [place], [place].Hyperglycemia Hyperglycemia occurs when the glucose (sugar) in your blood is too high. Hyperglycemia can happen for many reasons, but it most often happens to people who do not know they have diabetes or are not managing their diabetes properly.  CAUSES  Whether you have diabetes or not, there are other causes of hyperglycemia. Hyperglycemia can occur when you have diabetes, but it can also occur in other situations that you might not be as aware of, such as: Diabetes  If you have diabetes and are having problems controlling your blood glucose, hyperglycemia could occur because of some of the following reasons:   Not following your meal plan.   Not taking your diabetes medications or not taking it properly.   Exercising less or doing less activity than you normally do.   Being sick.  Pre-diabetes  This cannot be ignored. Before people develop Type 2 diabetes, they almost always  have "pre-diabetes." This is when your blood glucose levels are higher than normal, but not yet high enough to be diagnosed as diabetes. Research has shown that some long-term damage to the body, especially the heart and circulatory system, may already be occurring during pre-diabetes. If you take action to manage your blood glucose when you have pre-diabetes, you may delay or prevent Type 2 diabetes from developing.  Stress  If you have diabetes, you may be "diet" controlled or on oral medications or insulin to control your diabetes. However, you may find that your blood glucose is higher than usual in the hospital whether you have diabetes or not. This is often referred to as "stress hyperglycemia." Stress can elevate your blood glucose. This happens because of hormones put out by the body during times of stress. If stress has been the cause of your high blood glucose, it can be followed regularly by your caregiver. That way he/she can make sure your hyperglycemia does not continue to get worse or progress to diabetes.  Steroids  Steroids are medications that act on the infection fighting system (immune system) to block inflammation or infection. One side effect can be a rise in blood glucose. Most people can produce enough extra insulin to allow for this rise, but for those who cannot, steroids make blood glucose levels go even higher. It is not unusual for steroid treatments to "uncover" diabetes that is developing. It is not always possible to determine if the hyperglycemia will go away after the steroids are stopped. A special blood test called an A1c is sometimes done to determine if your blood glucose was elevated before the steroids were started.  SYMPTOMS  Thirsty.   Frequent urination.   Dry mouth.   Blurred vision.   Tired or fatigue.   Weakness.   Sleepy.   Tingling in feet or leg.  DIAGNOSIS  Diagnosis is made by monitoring blood glucose in one or all of the following  ways:  A1c test. This is a chemical found in your blood.   Fingerstick blood glucose monitoring.   Laboratory results.  TREATMENT  First, knowing the cause of the hyperglycemia is important before the hyperglycemia can be treated. Treatment may include, but is not be limited to:  Education.   Change or adjustment in medications.   Change or adjustment in meal plan.   Treatment for an illness, infection, etc.   More frequent blood glucose monitoring.   Change in exercise plan.  Decreasing or stopping steroids.   Lifestyle changes.  HOME CARE INSTRUCTIONS   Test your blood glucose as directed.   Exercise regularly. Your caregiver will give you instructions about exercise. Pre-diabetes or diabetes which comes on with stress is helped by exercising.   Eat wholesome, balanced meals. Eat often and at regular, fixed times. Your caregiver or nutritionist will give you a meal plan to guide your sugar intake.   Being at an ideal weight is important. If needed, losing as little as 10 to 15 pounds may help improve blood glucose levels.  SEEK MEDICAL CARE IF:   You have questions about medicine, activity, or diet.   You continue to have symptoms (problems such as increased thirst, urination, or weight gain).  SEEK IMMEDIATE MEDICAL CARE IF:   You are vomiting or have diarrhea.   Your breath smells fruity.   You are breathing faster or slower.   You are very sleepy or incoherent.   You have numbness, tingling, or pain in your feet or hands.   You have chest pain.   Your symptoms get worse even though you have been following your caregiver's orders.   If you have any other questions or concerns.  Document Released: 02/28/2001 Document Revised: 08/24/2011 Document Reviewed: 04/26/2009 [facility] Patient Information 2012 [place], [place].

## 2011-12-15 NOTE — ED Notes (Signed)
Pt spoke with daughter, will be enroute to pick up pt

## 2011-12-15 NOTE — ED Notes (Signed)
Attempted to call patients daughter to pick up pt, message left

## 2011-12-15 NOTE — ED Provider Notes (Addendum)
History     CSN: [id number]  Arrival date & time 12/14/11  1947   First MD Initiated Contact with Patient 12/14/11 2034      Chief Complaint  Patient presents with  . Shortness of Breath    (Consider location/radiation/quality/duration/timing/severity/associated sxs/prior treatment) HPI Comments: Patient here with a two day history of worsening shortness of breath with non-productive cough and congestion - she reports generalized body aches without fever or chills,  She denies chest pain.  She states she has also vomited x 1 earlier today - she denies having taken her medications in the past 2 days as well.  Patient is a 56 y.o. female presenting with shortness of breath. The history is provided by the patient. No language interpreter was used.  Shortness of Breath  The current episode started 2 days ago. The onset was gradual. The problem occurs occasionally. The problem has been unchanged. The problem is moderate. The symptoms are relieved by nothing. The symptoms are aggravated by nothing. Associated symptoms include cough and shortness of breath. Pertinent negatives include no chest pain, no chest pressure, no orthopnea, no fever, no rhinorrhea, no sore throat, no stridor and no wheezing. There was no intake of a foreign body. She was not exposed to toxic fumes. She has not inhaled smoke recently. She has had no prior steroid use. She has had prior hospitalizations. She has had no prior ICU admissions. She has had no prior intubations. Urine output has been normal. The last void occurred less than 6 hours ago. There were no sick contacts.    Past Medical History  Diagnosis Date  . Diabetes mellitus 1999    T2DM  . H/O gestational diabetes mellitus, not currently pregnant   . Asthma   . Hyperlipidemia   . Hypertension   . Obesity   . OSA (obstructive sleep apnea)     Past Surgical History  Procedure Date  . Cholecystectomy 1991  . Cesarean section 12/10/89  . Breast lumpectomy      Right breast    History reviewed. No pertinent family history.  History  Substance Use Topics  . Smoking status: Former Games developer  . Smokeless tobacco: Not on file  . Alcohol Use: Not on file    OB History    Grav Para Term Preterm Abortions TAB SAB Ect Mult Living                  Review of Systems  Constitutional: Negative for fever.  HENT: Negative for sore throat and rhinorrhea.   Respiratory: Positive for cough and shortness of breath. Negative for wheezing and stridor.   Cardiovascular: Negative for chest pain and orthopnea.  All other systems reviewed and are negative.    Allergies  Review of patient's allergies indicates no known allergies.  Home Medications   Current Outpatient Rx  Name Route Sig Dispense Refill  . ALBUTEROL SULFATE HFA 108 (90 BASE) MCG/ACT IN AERS Inhalation Inhale 2 puffs into the lungs every 6 (six) hours as needed. For shortness of breath    . ATORVASTATIN CALCIUM 20 MG PO TABS Oral Take 20 mg by mouth daily.    Marland Kitchen BIMATOPROST 0.01 % OP SOLN Both Eyes Place 1 drop into both eyes at bedtime.    Marland Kitchen BRIMONIDINE TARTRATE 0.1 % OP SOLN Both Eyes Place 1 drop into both eyes 2 (two) times daily.     Marland Kitchen BRINZOLAMIDE 1 % OP SUSP Both Eyes Place 1 drop into both eyes 2 (two) times  daily.    Marland Kitchen DIPHENHYDRAMINE HCL 25 MG PO TABS Oral Take 25 mg by mouth every 6 (six) hours as needed. For allergies    . INSULIN ASPART 100 UNIT/ML [place] SOLN Subcutaneous Inject into the skin 3 (three) times daily before meals. Use insulin sliding scale per protocol.    . INSULIN GLARGINE 100 UNIT/ML  SOLN Subcutaneous Inject 45 Units into the skin at bedtime.    Marland Kitchen LEVOCETIRIZINE DIHYDROCHLORIDE 5 MG PO TABS Oral Take 5 mg by mouth every evening.    Marland Kitchen METFORMIN HCL 1000 MG PO TABS Oral Take 1,000 mg by mouth 2 (two) times daily with a meal.    . RAMIPRIL 10 MG PO TABS Oral Take 10 mg by mouth daily.    . TELMISARTAN-HCTZ 80-25 MG PO TABS Oral Take 1 tablet by mouth daily.        BP 200/70  Pulse 76  Temp(Src) 103 F (39.4 C) (Oral)  Resp 36  SpO2 94%  Physical Exam  Nursing note and vitals reviewed. Constitutional: She is oriented to person, place, and time. She appears well-developed and well-nourished. No distress.       hypertensive  HENT:  Head: Normocephalic and atraumatic.  Right Ear: External ear normal.  Left Ear: External ear normal.  Nose: Nose normal.  Mouth/Throat: Oropharynx is clear and moist. No oropharyngeal exudate.  Eyes: Conjunctivae are normal. Pupils are equal, round, and reactive to light. No scleral icterus.  Neck: Normal range of motion. Neck supple.  Cardiovascular: Normal rate, regular rhythm and normal heart sounds.  Exam reveals no gallop and no friction rub.   No murmur heard. Pulmonary/Chest: Effort normal and breath sounds normal. No respiratory distress. She has no wheezes. She has no rales. She exhibits no tenderness.  Abdominal: Soft. Bowel sounds are normal. She exhibits no distension. There is no tenderness.  Musculoskeletal: Normal range of motion. She exhibits no edema and no tenderness.  Lymphadenopathy:    She has no cervical adenopathy.  Neurological: She is alert and oriented to person, place, and time. No cranial nerve deficit. She exhibits normal muscle tone. Coordination normal.  Skin: Skin is warm and dry. No rash noted. No erythema. No pallor.  Psychiatric: She has a normal mood and affect. Her behavior is normal. Judgment and thought content normal.    ED Course  Procedures (including critical care time)  Labs Reviewed  GLUCOSE, CAPILLARY - Abnormal; Notable for the following:    Glucose-Capillary 365 (*)    All other components within normal limits  CBC - Abnormal; Notable for the following:    Hemoglobin 10.6 (*)    HCT 33.4 (*)    MCH 25.6 (*)    All other components within normal limits  BASIC METABOLIC PANEL - Abnormal; Notable for the following:    Glucose, Bld 361 (*)    GFR calc non Af  Amer 64 (*)    GFR calc Af Amer 74 (*)    All other components within normal limits  PRO B NATRIURETIC PEPTIDE - Abnormal; Notable for the following:    Pro B Natriuretic peptide (BNP) 242.5 (*)    All other components within normal limits  GLUCOSE, CAPILLARY - Abnormal; Notable for the following:    Glucose-Capillary 208 (*)    All other components within normal limits  DIFFERENTIAL  POCT I-STAT TROPONIN I   Dg Chest 2 View  12/14/2011  *RADIOLOGY REPORT*  Clinical Data: Shortness of breath and cough.  CHEST - 2  VIEW  Comparison: Chest radiograph performed 09/29/2008, and CTA of the chest performed 09/30/2008  Findings: The lungs are well-aerated.  Mild bibasilar airspace opacities may reflect atelectasis or possibly mild pulmonary edema, given underlying vascular congestion.  No definite pleural effusion or pneumothorax is seen.  There is no evidence of pleural effusion or pneumothorax.  The heart is borderline enlarged.  No acute osseous abnormalities are seen.  Cervical spinal fusion hardware is noted.  Clips are noted within the right upper quadrant, reflecting prior cholecystectomy.  IMPRESSION: Mild bibasilar airspace opacities may reflect atelectasis or possibly mild pulmonary edema, given underlying vascular congestion and borderline cardiomegaly.  Original Report Authenticated By: Tonia Ghent, M.D.     Viral URI Shortness of breath Hypertension hyperglycemia    MDM  Patient here with complaints of shortness of breath and cough since Tuesday, she did not take her blood pressure medication and so her blood pressure has slowly come down, she is also noted to hyperglycemic and this has also now normalized.  She has been taking her inhaler without relief of shortness of breath, has nasal congestion and fever without any focal infection.  With her chronic medical problems, I plan to place her on abx because of this for respiratory infection and will discharge her home.  I have also  discussed placing the patient on burst dose of prednisone, knowing this will increase the patient's blood sugar and have opted not to do so as I suspect there may be non-compliance with this.  Believe the vascular congestion to be more related to the untreated hypertension more so than patient's symptoms.  She does not desat while walking and I feel that she may return home and follow up with her PCP.        Izola Price [place], [place] 12/15/11 0236  Izola Price. [place], [place] 01/23/12 [phone]

## 2011-12-15 NOTE — ED Notes (Signed)
Again spoke with pts daughter, states someone will be here as soon as possible

## 2011-12-16 NOTE — ED Provider Notes (Signed)
Medical screening examination/treatment/procedure(s) were performed by non-physician practitioner and as supervising physician I was immediately available for consultation/collaboration.   Mei Suits A Hadar Elgersma, MD 12/16/11 0744 

## 2012-01-03 ENCOUNTER — Ambulatory Visit: Payer: Self-pay | Admitting: *Deleted

## 2012-01-05 DIAGNOSIS — M1710 Unilateral primary osteoarthritis, unspecified knee: Secondary | ICD-10-CM | POA: Insufficient documentation

## 2012-01-11 ENCOUNTER — Encounter: Payer: Self-pay | Admitting: *Deleted

## 2012-01-11 ENCOUNTER — Encounter: Payer: Self-pay | Attending: Family Medicine | Admitting: *Deleted

## 2012-01-11 VITALS — Ht 61.5 in | Wt 236.5 lb

## 2012-01-11 DIAGNOSIS — E119 Type 2 diabetes mellitus without complications: Secondary | ICD-10-CM | POA: Insufficient documentation

## 2012-01-11 DIAGNOSIS — Z713 Dietary counseling and surveillance: Secondary | ICD-10-CM | POA: Insufficient documentation

## 2012-01-11 NOTE — Patient Instructions (Signed)
Plan: Aim for 3 Carb choices per meal (45 grams) +/- 1 either way Take Lantus at more consistent time (i.e. 10PM ) every day Continue to take 5 units of Novolog before each meal that contains 3 Carb Choices Double check Sliding Scale guidelines for amount of Novolog to take for BG reading

## 2012-01-11 NOTE — Progress Notes (Signed)
Medical Nutrition Therapy:  Appt start time: 12:30  End time: 13:00.  Assessment:  Primary concerns today: diabetes education and weight loss follow up visit. 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  Patient expressed good verbal understanding of Carb Counting and reading food labels. She also stated taking her Lantus at bedtime but that the time varies due to  wide range of times that she goes to bed. She also takes her Novolog before meals with Sliding Scale dose.  MEDICATIONS: See medication list.   Usual physical activity: None  Estimated energy needs: 1200 calories 135 g carbohydrates  Progress Towards Goal(s):  In progress.   Nutritional Diagnosis:  [place]-2.1 Impaired nutrient utilization related to glucose metabolism as evidenced by inconsistent dietary intake pattern/meal skipping and MD referral for additional MNT for T2DM.    Intervention: Concentrated today on insulin action of Lantus and Novolog. Explained importance of taking Lantus at consistent time of day and she decided on 10 PM. Also discussed that Novolog was a fast acting insulin to be taken right before the meal and that is is used for food in addition to correction of high BG. Explained that she could be taught to adjust her Novolog based on her Carb intake for more stable BG management if she would be interested and if OK with her MD. We will start with more consistent timing of Lantus and pursue meal time insulin options at next visit.  Plan: Continue to aim for 3 Carb choices per meal (45 grams) +/- 1 either way Take Lantus at more consistent time (i.e. 10PM ) every day Continue to take 5 units of Novolog before each meal that contains 3 Carb Choices Double check Sliding Scale guidelines for amount of Novolog to take for BG reading  Handouts given during visit include:  Hand drawn description of insulin action of Lantus and  Novolog  Monitoring/Evaluation:  Dietary intake, exercise, A1c (as available), BG trends, and body weight in 4 week(s).

## 2012-02-01 NOTE — ED Provider Notes (Signed)
Medical screening examination/treatment/procedure(s) were performed by non-physician practitioner and as supervising physician I was immediately available for consultation/collaboration.   Kadince Boxley A Rameses Ou, MD 02/01/12 2338 

## 2012-07-07 ENCOUNTER — Emergency Department (INDEPENDENT_AMBULATORY_CARE_PROVIDER_SITE_OTHER)
Admission: EM | Admit: 2012-07-07 | Discharge: 2012-07-07 | Disposition: A | Discharge status: Home / Self Care | Payer: BC Managed Care – PPO | Source: Home / Self Care | Attending: Emergency Medicine | Admitting: Emergency Medicine

## 2012-07-07 ENCOUNTER — Encounter (HOSPITAL_COMMUNITY): Payer: Self-pay | Admitting: Emergency Medicine

## 2012-07-07 DIAGNOSIS — R110 Nausea: Secondary | ICD-10-CM

## 2012-07-07 DIAGNOSIS — R51 Headache: Secondary | ICD-10-CM

## 2012-07-07 LAB — POCT URINALYSIS DIP (DEVICE)
Glucose, UA: 500 mg/dL — AB
Hgb urine dipstick: NEGATIVE
Ketones, ur: NEGATIVE mg/dL
Specific Gravity, Urine: 1.01 (ref 1.005–1.030)

## 2012-07-07 MED ORDER — ONDANSETRON 4 MG PO TBDP
8.0000 mg | ORAL_TABLET | Freq: Once | ORAL | Status: AC
  Administered 2012-07-07: 8 mg via ORAL

## 2012-07-07 MED ORDER — TRAMADOL HCL 50 MG PO TABS: 50.0000 mg | ORAL_TABLET | Freq: Four times a day (QID) | ORAL | Status: DC | PRN

## 2012-07-07 MED ORDER — BLOOD GLUCOSE METER KIT: PACK | Status: DC

## 2012-07-07 MED ORDER — KETOROLAC TROMETHAMINE 60 MG/2ML IM SOLN
INTRAMUSCULAR | Status: AC
  Filled 2012-07-07: qty 2

## 2012-07-07 MED ORDER — KETOROLAC TROMETHAMINE 60 MG/2ML IM SOLN
60.0000 mg | Freq: Once | INTRAMUSCULAR | Status: AC
  Administered 2012-07-07: 60 mg via INTRAMUSCULAR

## 2012-07-07 MED ORDER — ONDANSETRON 4 MG PO TBDP
ORAL_TABLET | ORAL | Status: AC
  Filled 2012-07-07: qty 2

## 2012-07-07 NOTE — ED Notes (Signed)
Waiting discharge papers 

## 2012-07-07 NOTE — ED Provider Notes (Signed)
History     CSN: [id number]  Arrival date & time 07/07/12  1513   None     Chief Complaint  Patient presents with  . Headache    (Consider location/radiation/quality/duration/timing/severity/associated sxs/prior treatment) The history is provided by the patient.  Patient complains of a 3 week history of persistent headache. Character:  Throbbing, achy Location: frontal head, temporal Aggravating activities:  Fatigue, emotion Alleviating activities:  Supine, quiet, aleve No history of injury.  Not worst headache of life.  Does not awaken from sleep.  No radiation, dizziness, syncope, LOC, diplopia, loss of vision, aura, photophobia. + nausea, no vomiting.  No extremity weakness, numbness, or paresthesias Student in school.  Past Medical History  Diagnosis Date  . Diabetes mellitus 1999    T2DM  . H/O gestational diabetes mellitus, not currently pregnant   . Asthma   . Hyperlipidemia   . Hypertension   . Obesity   . OSA (obstructive sleep apnea)     Past Surgical History  Procedure Date  . Cholecystectomy 1991  . Cesarean section 12/10/89  . Breast lumpectomy     Right breast    History reviewed. No pertinent family history.  History  Substance Use Topics  . Smoking status: Former Games developer  . Smokeless tobacco: Not on file  . Alcohol Use: No    OB History    Grav Para Term Preterm Abortions TAB SAB Ect Mult Living                  Review of Systems  All other systems reviewed and are negative.    Allergies  Food  Home Medications   Current Outpatient Rx  Name Route Sig Dispense Refill  . ALBUTEROL SULFATE HFA 108 (90 BASE) MCG/ACT IN AERS Inhalation Inhale 2 puffs into the lungs every 6 (six) hours as needed. For shortness of breath    . ATORVASTATIN CALCIUM 20 MG PO TABS Oral Take 20 mg by mouth daily.    Marland Kitchen BIMATOPROST 0.01 % OP SOLN Both Eyes Place 1 drop into both eyes at bedtime.    Marland Kitchen BRIMONIDINE TARTRATE 0.1 % OP SOLN Both Eyes Place 1 drop  into both eyes 2 (two) times daily.     Marland Kitchen BRINZOLAMIDE 1 % OP SUSP Both Eyes Place 1 drop into both eyes 2 (two) times daily.    Marland Kitchen DIPHENHYDRAMINE HCL 25 MG PO TABS Oral Take 25 mg by mouth every 6 (six) hours as needed. For allergies    . INSULIN ASPART 100 UNIT/ML [place] SOLN Subcutaneous Inject into the skin 3 (three) times daily before meals. Use insulin sliding scale per protocol.    . INSULIN GLARGINE 100 UNIT/ML [place] SOLN Subcutaneous Inject 45 Units into the skin at bedtime.    Marland Kitchen LEVOCETIRIZINE DIHYDROCHLORIDE 5 MG PO TABS Oral Take 5 mg by mouth every evening.    Marland Kitchen METFORMIN HCL 1000 MG PO TABS Oral Take 1,000 mg by mouth 2 (two) times daily with a meal.    . RAMIPRIL 10 MG PO TABS Oral Take 10 mg by mouth daily.    . TELMISARTAN-HCTZ 80-25 MG PO TABS Oral Take 1 tablet by mouth daily.    . TRAMADOL HCL 50 MG PO TABS Oral Take 1 tablet (50 mg total) by mouth every 6 (six) hours as needed for pain. 20 tablet 0    BP 158/61  Pulse 70  Temp 98.4 F (36.9 C) (Oral)  Resp 18  SpO2 100%  Physical Exam  Nursing note and vitals reviewed. Constitutional: She is oriented to person, place, and time. Vital signs are normal. She appears well-developed and well-nourished. She is active and cooperative.  HENT:  Head: Normocephalic.  Eyes: Conjunctivae normal are normal. Pupils are equal, round, and reactive to light. No scleral icterus.  Neck: Trachea normal and normal range of motion. Neck supple. Muscular tenderness present. No spinous process tenderness present. Carotid bruit is not present.  Cardiovascular: Normal rate, regular rhythm, normal heart sounds, intact distal pulses and normal pulses.   Pulmonary/Chest: Effort normal and breath sounds normal.  Lymphadenopathy:    She has no cervical adenopathy.  Neurological: She is alert and oriented to person, place, and time. No cranial nerve deficit or sensory deficit. GCS eye subscore is 4. GCS verbal subscore is 5. GCS motor subscore is 6.    Skin: Skin is warm and dry.  Psychiatric: She has a normal mood and affect. Her speech is normal and behavior is normal. Judgment and thought content normal. Cognition and memory are normal.    ED Course  Procedures (including critical care time)  Labs Reviewed  POCT URINALYSIS DIP (DEVICE) - Abnormal; Notable for the following:    Glucose, UA 500 (*)     All other components within normal limits   No results found.   1. Headache   2. Nausea       MDM  toradol and zofran administered in office, headache improved. Restart your blood sugar medication and follow up with your pcp this week.        Johnsie Kindred, NP 07/07/12 1727

## 2012-07-07 NOTE — ED Provider Notes (Signed)
Medical screening examination/treatment/procedure(s) were performed by non-physician practitioner and as supervising physician I was immediately available for consultation/collaboration.  Leslee Home, M.D.   Reuben Likes, MD 07/07/12 2127

## 2012-07-07 NOTE — ED Notes (Addendum)
Pt is c/o  Headache above eyes x 3 wks. Pt states that her eyes have been blood shot off and on. Has tried ibuprofen for pain with only mild relief.   Pt denies blurred or spotty vision.  Some light sensitivity. Pt states that she is unable to focus on work because her head hurts so bad and she has a very busy schedule.  Pt states that she stopped taking her metformin because she started to eat healthier and she feels like the large dose is making her sick to her stomach.   Pt has been under a lot of stress over the past couple of months.   Needs rx for a new glucometer.

## 2012-07-09 ENCOUNTER — Emergency Department (HOSPITAL_COMMUNITY)
Admission: EM | Admit: 2012-07-09 | Discharge: 2012-07-09 | Disposition: A | Discharge status: Home / Self Care | Payer: BC Managed Care – PPO | Attending: Emergency Medicine | Admitting: Emergency Medicine

## 2012-07-09 ENCOUNTER — Encounter (HOSPITAL_COMMUNITY): Payer: Self-pay | Admitting: *Deleted

## 2012-07-09 DIAGNOSIS — R112 Nausea with vomiting, unspecified: Secondary | ICD-10-CM | POA: Insufficient documentation

## 2012-07-09 DIAGNOSIS — R42 Dizziness and giddiness: Secondary | ICD-10-CM | POA: Insufficient documentation

## 2012-07-09 DIAGNOSIS — Z87891 Personal history of nicotine dependence: Secondary | ICD-10-CM | POA: Insufficient documentation

## 2012-07-09 DIAGNOSIS — E785 Hyperlipidemia, unspecified: Secondary | ICD-10-CM | POA: Insufficient documentation

## 2012-07-09 DIAGNOSIS — Z794 Long term (current) use of insulin: Secondary | ICD-10-CM | POA: Insufficient documentation

## 2012-07-09 DIAGNOSIS — J45909 Unspecified asthma, uncomplicated: Secondary | ICD-10-CM | POA: Insufficient documentation

## 2012-07-09 DIAGNOSIS — E119 Type 2 diabetes mellitus without complications: Secondary | ICD-10-CM | POA: Insufficient documentation

## 2012-07-09 DIAGNOSIS — G43909 Migraine, unspecified, not intractable, without status migrainosus: Secondary | ICD-10-CM | POA: Insufficient documentation

## 2012-07-09 DIAGNOSIS — I10 Essential (primary) hypertension: Secondary | ICD-10-CM | POA: Insufficient documentation

## 2012-07-09 DIAGNOSIS — E669 Obesity, unspecified: Secondary | ICD-10-CM | POA: Insufficient documentation

## 2012-07-09 DIAGNOSIS — Z79899 Other long term (current) drug therapy: Secondary | ICD-10-CM | POA: Insufficient documentation

## 2012-07-09 LAB — URINALYSIS, ROUTINE W REFLEX MICROSCOPIC
Bilirubin Urine: NEGATIVE
Ketones, ur: NEGATIVE mg/dL
Leukocytes, UA: NEGATIVE
Nitrite: NEGATIVE
Protein, ur: 100 mg/dL — AB
pH: 7 (ref 5.0–8.0)

## 2012-07-09 LAB — URINE MICROSCOPIC-ADD ON

## 2012-07-09 LAB — CBC WITH DIFFERENTIAL/PLATELET
Basophils Absolute: 0 10*3/uL (ref 0.0–0.1)
Eosinophils Absolute: 0 10*3/uL (ref 0.0–0.7)
Eosinophils Relative: 0 % (ref 0–5)
HCT: 39.5 % (ref 36.0–46.0)
Lymphocytes Relative: 15 % (ref 12–46)
MCH: 25.9 pg — ABNORMAL LOW (ref 26.0–34.0)
MCHC: 32.4 g/dL (ref 30.0–36.0)
MCV: 79.8 fL (ref 78.0–100.0)
Monocytes Absolute: 0.3 10*3/uL (ref 0.1–1.0)
Platelets: 211 10*3/uL (ref 150–400)
RDW: 13.6 % (ref 11.5–15.5)
WBC: 7.7 10*3/uL (ref 4.0–10.5)

## 2012-07-09 LAB — POCT I-STAT, CHEM 8
BUN: 22 mg/dL (ref 6–23)
Calcium, Ion: 1.2 mmol/L (ref 1.12–1.23)
Chloride: 101 mEq/L (ref 96–112)
Glucose, Bld: 275 mg/dL — ABNORMAL HIGH (ref 70–99)
HCT: 42 % (ref 36.0–46.0)
TCO2: 26 mmol/L (ref 0–100)

## 2012-07-09 LAB — TROPONIN I: Troponin I: <0.3 ng/mL (ref <0.30)

## 2012-07-09 MED ORDER — PROMETHAZINE HCL 25 MG PO TABS: 25.0000 mg | ORAL_TABLET | Freq: Four times a day (QID) | ORAL | Status: DC | PRN

## 2012-07-09 MED ORDER — HYDROMORPHONE HCL PF 1 MG/ML IJ SOLN
1.0000 mg | Freq: Once | INTRAMUSCULAR | Status: AC
  Administered 2012-07-09: 1 mg via INTRAVENOUS
  Filled 2012-07-09: qty 1

## 2012-07-09 MED ORDER — ONDANSETRON HCL 4 MG/2ML IJ SOLN
4.0000 mg | Freq: Once | INTRAMUSCULAR | Status: AC
  Administered 2012-07-09: 4 mg via INTRAVENOUS
  Filled 2012-07-09: qty 2

## 2012-07-09 MED ORDER — OXYCODONE-ACETAMINOPHEN 5-325 MG PO TABS: 2.0000 | ORAL_TABLET | ORAL | Status: DC | PRN

## 2012-07-09 MED ORDER — DIPHENHYDRAMINE HCL 50 MG/ML IJ SOLN
25.0000 mg | Freq: Once | INTRAMUSCULAR | Status: AC
  Administered 2012-07-09: 25 mg via INTRAVENOUS
  Filled 2012-07-09: qty 1

## 2012-07-09 MED ORDER — KETOROLAC TROMETHAMINE 30 MG/ML IJ SOLN
30.0000 mg | Freq: Once | INTRAMUSCULAR | Status: AC
  Administered 2012-07-09: 30 mg via INTRAVENOUS
  Filled 2012-07-09: qty 1

## 2012-07-09 NOTE — ED Notes (Signed)
PT sts that she was seen at Urgent care on Sunday for a headache and was prescribed Ultram.  After taking the Ultram she started vomiting last nite.  Pt headache is better, but she is dizzy.

## 2012-07-09 NOTE — ED Provider Notes (Signed)
History     CSN: [id number]  Arrival date & time 07/09/12  1300   First MD Initiated Contact with Patient 07/09/12 1807      Chief Complaint  Patient presents with  . Nausea  . Emesis  . Headache  . Dizziness    (Consider location/radiation/quality/duration/timing/severity/associated sxs/prior treatment) HPI Comments: Patient is a 56 year old female with a past medical history of diabetes, obesity, hypertension, hyperlipidemia who presents with a one month history of intermittent episodes of dizziness and nausea. Episodes start gradually and last a few hours and resolve spontaneously without intervention. This occurs every couple days. No alleviating/aggravating factors. She reports associated burning sensation located under her right breast. No chest pain, SOB, diaphoresis, fever, vomiting, diarrhea, numbness/tingling, weakness.   Patient is a 56 y.o. female presenting with vomiting and headaches.  Emesis  Associated symptoms include headaches.  Headache  Associated symptoms include nausea.    Past Medical History  Diagnosis Date  . Diabetes mellitus 1999    T2DM  . H/O gestational diabetes mellitus, not currently pregnant   . Asthma   . Hyperlipidemia   . Hypertension   . Obesity   . OSA (obstructive sleep apnea)     Past Surgical History  Procedure Date  . Cholecystectomy 1991  . Cesarean section 12/10/89  . Breast lumpectomy     Right breast    No family history on file.  History  Substance Use Topics  . Smoking status: Former Games developer  . Smokeless tobacco: Not on file  . Alcohol Use: No    OB History    Grav Para Term Preterm Abortions TAB SAB Ect Mult Living                  Review of Systems  Cardiovascular: Positive for chest pain.  Gastrointestinal: Positive for nausea.  Neurological: Positive for dizziness and headaches.  All other systems reviewed and are negative.    Allergies  Food  Home Medications   Current Outpatient Rx  Name  Route Sig Dispense Refill  . ALBUTEROL SULFATE HFA 108 (90 BASE) MCG/ACT IN AERS Inhalation Inhale 2 puffs into the lungs every 6 (six) hours as needed. For shortness of breath    . ATORVASTATIN CALCIUM 20 MG PO TABS Oral Take 20 mg by mouth daily.    Marland Kitchen BIMATOPROST 0.01 % OP SOLN Both Eyes Place 1 drop into both eyes at bedtime.    Marland Kitchen BRIMONIDINE TARTRATE 0.1 % OP SOLN Both Eyes Place 1 drop into both eyes 2 (two) times daily.     Marland Kitchen BRINZOLAMIDE 1 % OP SUSP Both Eyes Place 1 drop into both eyes 2 (two) times daily.    . INSULIN ASPART 100 UNIT/ML [place] SOLN Subcutaneous Inject 5-7 Units into the skin 3 (three) times daily before meals. Use insulin sliding scale per protocol.    . INSULIN GLARGINE 100 UNIT/ML [place] SOLN Subcutaneous Inject 45 Units into the skin at bedtime.    Marland Kitchen LEVOCETIRIZINE DIHYDROCHLORIDE 5 MG PO TABS Oral Take 5 mg by mouth every evening.    Marland Kitchen METFORMIN HCL 1000 MG PO TABS Oral Take 1,000 mg by mouth 2 (two) times daily with a meal.    . RAMIPRIL 10 MG PO TABS Oral Take 10 mg by mouth daily.    . TELMISARTAN-HCTZ 80-25 MG PO TABS Oral Take 1 tablet by mouth daily.    . TRAMADOL HCL 50 MG PO TABS Oral Take 50 mg by mouth every 6 (six)  hours as needed. For pain    . BLOOD GLUCOSE METER KIT  Use as instructed 1 each 0    BP 169/73  Pulse 68  Temp 98.1 F (36.7 C) (Oral)  Resp 16  SpO2 100%  Physical Exam  Nursing note and vitals reviewed. Constitutional: She is oriented to person, place, and time. She appears well-developed and well-nourished. No distress.  HENT:  Head: Normocephalic and atraumatic.  Mouth/Throat: No oropharyngeal exudate.  Eyes: Conjunctivae normal and EOM are normal. Pupils are equal, round, and reactive to light. No scleral icterus.  Neck: Normal range of motion. Neck supple.  Cardiovascular: Normal rate and regular rhythm.  Exam reveals no gallop and no friction rub.   No murmur heard. Pulmonary/Chest: Effort normal and breath sounds normal. She has no  wheezes. She has no rales. She exhibits no tenderness.  Abdominal: Soft. She exhibits no distension. There is no tenderness. There is no rebound and no guarding.  Musculoskeletal: Normal range of motion.  Neurological: She is alert and oriented to person, place, and time. No cranial nerve deficit. Coordination normal.       Strength and sensation equal and intact bilaterally. Speech is goal-oriented. Moves limbs without ataxia.   Skin: Skin is warm and dry. She is not diaphoretic.  Psychiatric: She has a normal mood and affect. Her behavior is normal.    ED Course  Procedures (including critical care time)   Date: 07/09/2012  Rate: 57  Rhythm: normal sinus rhythm  QRS Axis: normal  Intervals: normal  ST/T Wave abnormalities: nonspecific ST/T changes  Conduction Disutrbances:none  Narrative Interpretation: NSR unchanged from previous  Old EKG Reviewed: unchanged    Labs Reviewed  CBC WITH DIFFERENTIAL - Abnormal; Notable for the following:    MCH 25.9 (*)     Neutrophils Relative 82 (*)     All other components within normal limits  URINALYSIS, ROUTINE W REFLEX MICROSCOPIC - Abnormal; Notable for the following:    Glucose, UA >1000 (*)     Protein, ur 100 (*)     All other components within normal limits  POCT I-STAT, CHEM 8 - Abnormal; Notable for the following:    Glucose, Bld 275 (*)     All other components within normal limits  URINE MICROSCOPIC-ADD ON  TROPONIN I   No results found.   1. Migraine       MDM  6:54 PM Patient will have migraine cocktail and zofran for nausea. Patient's labs unremarkable. EKG and troponin pending to rule out cardiac etiology considering female, diabetic, obesity, and htn.   7:40 PM EKG unremarkable.   8:31 PM First troponin negative. Patient reports relief of symptoms with migraine cocktail.   9:54 PM Dr. Anitra Lauth saw the patient and concluded that the headaches are caused by the patient stopping caffeine 3 weeks ago which  is when the headaches started. Patient unlikely having cardiac etiology of symptoms. No focal neurological deficits. Patient will be discharged with medication for headache and recommended follow up with PCP. No further evaluation needed at this time.   Emilia Beck, [place] 07/10/12 [phone]

## 2012-07-11 NOTE — ED Provider Notes (Signed)
Medical screening examination/treatment/procedure(s) were conducted as a shared visit with non-physician practitioner(s) and myself.  I personally evaluated the patient during the encounter  Pt with typical migraine HA without sx suggestive of SAH(sudden onset, worst of life, or deficits), infection, or cavernous vein thrombosis.  Normal neuro exam and vital signs. Will give HA cocktail and on re-eval she is feeling better. Pt with atypical story for CP.   PERC neg.  EKG wnl.  CXR, CBC, BMP, CE all neg   Gwyneth Sprout, MD 07/11/12 1340

## 2012-10-08 ENCOUNTER — Emergency Department (HOSPITAL_COMMUNITY)
Admission: EM | Admit: 2012-10-08 | Discharge: 2012-10-08 | Disposition: A | Discharge status: Home / Self Care | Payer: BC Managed Care – PPO

## 2012-10-08 ENCOUNTER — Encounter (HOSPITAL_COMMUNITY): Payer: Self-pay | Admitting: Emergency Medicine

## 2012-10-08 ENCOUNTER — Emergency Department (INDEPENDENT_AMBULATORY_CARE_PROVIDER_SITE_OTHER)
Admission: EM | Admit: 2012-10-08 | Discharge: 2012-10-08 | Disposition: A | Discharge status: Home / Self Care | Payer: BC Managed Care – PPO | Source: Home / Self Care | Attending: Family Medicine | Admitting: Family Medicine

## 2012-10-08 DIAGNOSIS — J069 Acute upper respiratory infection, unspecified: Secondary | ICD-10-CM

## 2012-10-08 DIAGNOSIS — E119 Type 2 diabetes mellitus without complications: Secondary | ICD-10-CM

## 2012-10-08 MED ORDER — INSULIN GLARGINE 100 UNIT/ML ~~LOC~~ SOLN: 45.0000 [IU] | Freq: Every day | SUBCUTANEOUS | Status: DC

## 2012-10-08 MED ORDER — IPRATROPIUM BROMIDE 0.06 % NA SOLN: 2.0000 | Freq: Four times a day (QID) | NASAL | Status: DC

## 2012-10-08 NOTE — ED Notes (Signed)
Pt c/o head congestion and runny nose. Dry nonproductive cough and fatigue.  Headache. Some nausea. Denies vomiting and diarrhea. Symptoms since Sunday.   Pt states she is also out of lantus. And needs rx for new glucose monitor. Out meds since Thursday.

## 2012-10-08 NOTE — ED Provider Notes (Signed)
History     CSN: [id number]  Arrival date & time 10/08/12  [name]   First MD Initiated Contact with Patient 10/08/12 1829      Chief Complaint  Patient presents with  . URI    runny nose. nonproductive dry cough. fatigue. ? if sugar is high out of lantus x 5 days    (Consider location/radiation/quality/duration/timing/severity/associated sxs/prior treatment) Patient is a 57 y.o. female presenting with URI. The history is provided by the patient.  URI Primary symptoms do not include fever or cough. The current episode started 2 days ago. This is a new problem.  Symptoms associated with the illness include congestion and rhinorrhea. Risk factors for severe complications from URI include diabetes mellitus (out of lantus for 5 days, no money.).    Past Medical History  Diagnosis Date  . Diabetes mellitus 1999    T2DM  . H/O gestational diabetes mellitus, not currently pregnant   . Asthma   . Hyperlipidemia   . Hypertension   . Obesity   . OSA (obstructive sleep apnea)     Past Surgical History  Procedure Date  . Cholecystectomy 1991  . Cesarean section 12/10/89  . Breast lumpectomy     Right breast    History reviewed. No pertinent family history.  History  Substance Use Topics  . Smoking status: Former Games developer  . Smokeless tobacco: Not on file  . Alcohol Use: Yes     Comment: occasional    OB History    Grav Para Term Preterm Abortions TAB SAB Ect Mult Living                  Review of Systems  Constitutional: Negative for fever.  HENT: Positive for congestion and rhinorrhea.   Respiratory: Negative for cough.     Allergies  Food  Home Medications   Current Outpatient Rx  Name  Route  Sig  Dispense  Refill  . BIMATOPROST 0.01 % OP SOLN   Both Eyes   Place 1 drop into both eyes at bedtime.         Marland Kitchen BLOOD GLUCOSE METER KIT      Use as instructed   1 each   0   . INSULIN ASPART 100 UNIT/ML [place] SOLN   Subcutaneous   Inject 5-7 Units into the  skin 3 (three) times daily before meals. Use insulin sliding scale per protocol.         . INSULIN GLARGINE 100 UNIT/ML [place] SOLN   Subcutaneous   Inject 45 Units into the skin at bedtime.         Marland Kitchen METFORMIN HCL 1000 MG PO TABS   Oral   Take 1,000 mg by mouth 2 (two) times daily with a meal.         . TELMISARTAN-HCTZ 80-25 MG PO TABS   Oral   Take 1 tablet by mouth daily.         . ALBUTEROL SULFATE HFA 108 (90 BASE) MCG/ACT IN AERS   Inhalation   Inhale 2 puffs into the lungs every 6 (six) hours as needed. For shortness of breath         . ATORVASTATIN CALCIUM 20 MG PO TABS   Oral   Take 20 mg by mouth daily.         Marland Kitchen BRIMONIDINE TARTRATE 0.1 % OP SOLN   Both Eyes   Place 1 drop into both eyes 2 (two) times daily.          Marland Kitchen  BRINZOLAMIDE 1 % OP SUSP   Both Eyes   Place 1 drop into both eyes 2 (two) times daily.         . INSULIN GLARGINE 100 UNIT/ML [place] SOLN   Subcutaneous   Inject 45 Units into the skin at bedtime.   3 mL   12   . IPRATROPIUM BROMIDE 0.06 % NA SOLN   Nasal   Place 2 sprays into the nose 4 (four) times daily.   15 mL   1   . LEVOCETIRIZINE DIHYDROCHLORIDE 5 MG PO TABS   Oral   Take 5 mg by mouth every evening.         . OXYCODONE-ACETAMINOPHEN 5-325 MG PO TABS   Oral   Take 2 tablets by mouth every 4 (four) hours as needed for pain.   15 tablet   0   . PROMETHAZINE HCL 25 MG PO TABS   Oral   Take 1 tablet (25 mg total) by mouth every 6 (six) hours as needed for nausea.   12 tablet   0   . RAMIPRIL 10 MG PO TABS   Oral   Take 10 mg by mouth daily.         . TRAMADOL HCL 50 MG PO TABS   Oral   Take 50 mg by mouth every 6 (six) hours as needed. For pain           BP 176/70  Pulse 68  Temp 98.3 F (36.8 C) (Oral)  Resp 18  Ht 5\' 1"  (1.549 m)  Wt 240 lb (108.863 kg)  BMI 45.35 kg/m2  SpO2 98%  Physical Exam  Nursing note and vitals reviewed. Constitutional: She is oriented to person, place, and time.  She appears well-developed and well-nourished.  HENT:  Head: Normocephalic.  Right Ear: External ear normal.  Left Ear: External ear normal.  Mouth/Throat: Oropharynx is clear and moist.  Eyes: Pupils are equal, round, and reactive to light.  Neck: Normal range of motion. Neck supple.  Cardiovascular: Regular rhythm and normal heart sounds.   Neurological: She is alert and oriented to person, place, and time.  Skin: Skin is warm and dry.  Psychiatric: She has a normal mood and affect.    ED Course  Procedures (including critical care time)  Labs Reviewed - No data to display No results found.   1. URI (upper respiratory infection)   2. Diabetes mellitus       MDM          Linna Hoff, MD 10/10/12 [phone]

## 2012-10-08 NOTE — ED Notes (Signed)
Waiting to have blood drawn by adult [facility] clinic.

## 2012-11-19 MED ORDER — ALBUTEROL SULFATE HFA 108 (90 BASE) MCG/ACT IN AERS: 2.0000 | INHALATION_SPRAY | Freq: Four times a day (QID) | RESPIRATORY_TRACT | Status: DC | PRN

## 2012-12-18 ENCOUNTER — Emergency Department (HOSPITAL_COMMUNITY)
Admission: EM | Admit: 2012-12-18 | Discharge: 2012-12-19 | Disposition: A | Discharge status: Home / Self Care | Payer: BC Managed Care – PPO | Attending: Emergency Medicine | Admitting: Emergency Medicine

## 2012-12-18 ENCOUNTER — Encounter (HOSPITAL_COMMUNITY): Payer: Self-pay | Admitting: *Deleted

## 2012-12-18 DIAGNOSIS — I10 Essential (primary) hypertension: Secondary | ICD-10-CM | POA: Insufficient documentation

## 2012-12-18 DIAGNOSIS — Z87891 Personal history of nicotine dependence: Secondary | ICD-10-CM | POA: Insufficient documentation

## 2012-12-18 DIAGNOSIS — Z8669 Personal history of other diseases of the nervous system and sense organs: Secondary | ICD-10-CM | POA: Insufficient documentation

## 2012-12-18 DIAGNOSIS — E785 Hyperlipidemia, unspecified: Secondary | ICD-10-CM | POA: Insufficient documentation

## 2012-12-18 DIAGNOSIS — L0291 Cutaneous abscess, unspecified: Secondary | ICD-10-CM

## 2012-12-18 DIAGNOSIS — R739 Hyperglycemia, unspecified: Secondary | ICD-10-CM

## 2012-12-18 DIAGNOSIS — L0231 Cutaneous abscess of buttock: Secondary | ICD-10-CM | POA: Insufficient documentation

## 2012-12-18 DIAGNOSIS — E1169 Type 2 diabetes mellitus with other specified complication: Secondary | ICD-10-CM | POA: Insufficient documentation

## 2012-12-18 DIAGNOSIS — J45909 Unspecified asthma, uncomplicated: Secondary | ICD-10-CM | POA: Insufficient documentation

## 2012-12-18 DIAGNOSIS — Z79899 Other long term (current) drug therapy: Secondary | ICD-10-CM | POA: Insufficient documentation

## 2012-12-18 DIAGNOSIS — Z794 Long term (current) use of insulin: Secondary | ICD-10-CM | POA: Insufficient documentation

## 2012-12-18 DIAGNOSIS — B379 Candidiasis, unspecified: Secondary | ICD-10-CM | POA: Insufficient documentation

## 2012-12-18 DIAGNOSIS — E669 Obesity, unspecified: Secondary | ICD-10-CM | POA: Insufficient documentation

## 2012-12-18 LAB — URINALYSIS, ROUTINE W REFLEX MICROSCOPIC
Leukocytes, UA: NEGATIVE
Nitrite: NEGATIVE
Protein, ur: 30 mg/dL — AB
Specific Gravity, Urine: 1.04 — ABNORMAL HIGH (ref 1.005–1.030)
Urobilinogen, UA: 0.2 mg/dL (ref 0.0–1.0)

## 2012-12-18 LAB — URINE MICROSCOPIC-ADD ON

## 2012-12-18 MED ORDER — INSULIN GLARGINE 100 UNIT/ML ~~LOC~~ SOLN: 45.0000 [IU] | Freq: Every day | SUBCUTANEOUS | Status: DC

## 2012-12-18 MED ORDER — INSULIN ASPART 100 UNIT/ML ~~LOC~~ SOLN
8.0000 [IU] | Freq: Once | SUBCUTANEOUS | Status: AC
  Administered 2012-12-18: 8 [IU] via SUBCUTANEOUS
  Filled 2012-12-18: qty 1

## 2012-12-18 MED ORDER — INSULIN GLARGINE 100 UNIT/ML ~~LOC~~ SOLN
45.0000 [IU] | Freq: Once | SUBCUTANEOUS | Status: AC
  Administered 2012-12-18: 45 [IU] via SUBCUTANEOUS
  Filled 2012-12-18: qty 0.45

## 2012-12-18 MED ORDER — INSULIN ASPART 100 UNIT/ML ~~LOC~~ SOLN: 5.0000 [IU] | Freq: Three times a day (TID) | SUBCUTANEOUS | Status: DC

## 2012-12-18 MED ORDER — SODIUM CHLORIDE 0.9 % IV BOLUS (SEPSIS)
1000.0000 mL | Freq: Once | INTRAVENOUS | Status: AC
  Administered 2012-12-18: 1000 mL via INTRAVENOUS

## 2012-12-18 MED ORDER — SODIUM CHLORIDE 0.9 % IV BOLUS (SEPSIS): 1000.0000 mL | Freq: Once | INTRAVENOUS | Status: DC

## 2012-12-18 MED ORDER — METFORMIN HCL 1000 MG PO TABS: 1000.0000 mg | ORAL_TABLET | Freq: Two times a day (BID) | ORAL | Status: DC

## 2012-12-18 NOTE — ED Provider Notes (Signed)
History  This chart was scribed for non-physician practitioner working with Laray Anger, DO by Ardeen Jourdain, ED Scribe. This patient was seen in room WTR5/WTR5 and the patient's care was started at 2127.  CSN: [id number]  Arrival date & time 12/18/12  2114   First MD Initiated Contact with Patient 12/18/12 2127      Chief Complaint  Patient presents with  . Abscess     Patient is a 57 y.o. female presenting with abscess. The history is provided by the patient. No language interpreter was used.  Abscess Location:  Ano-genital Ano-genital abscess location:  L buttock Abscess quality: not draining   Red streaking: no   Duration:  3 days Progression:  Worsening Chronicity:  New Context: diabetes   Context: not immunosuppression, not injected drug use, not insect bite/sting and not skin injury   Relieved by:  None tried Exacerbated by: Urination. Ineffective treatments:  Draining/squeezing Associated symptoms: no anorexia, no fatigue, no fever, no headaches, no nausea and no vomiting     [name] is a 57 y.o. female with a h/o DM who presents to the Emergency Department complaining of a gradual onset, gradually worsening, constant abscess to her left lower buttock that began 3 days ago. Pt also has a yeast infection. She states she would try to "bust" the area with no success/relief. She states it burns now when she urinates. She reports "feeling sick" for the past few days. She states she was not able to take her normal dosage of her DM medication last night. She denies any nausea, emesis, fever and chills as associated symptoms.    Past Medical History  Diagnosis Date  . Diabetes mellitus 1999    T2DM  . H/O gestational diabetes mellitus, not currently pregnant   . Asthma   . Hyperlipidemia   . Hypertension   . Obesity   . OSA (obstructive sleep apnea)     Past Surgical History  Procedure Laterality Date  . Cholecystectomy  1991  . Cesarean section  12/10/89   . Breast lumpectomy      Right breast    No family history on file.  History  Substance Use Topics  . Smoking status: Former Games developer  . Smokeless tobacco: Not on file  . Alcohol Use: Yes     Comment: occasional   No OB history available.   Review of Systems  Constitutional: Negative for fever, chills and fatigue.  Respiratory: Negative for shortness of breath.   Gastrointestinal: Negative for nausea, vomiting and anorexia.  Skin:       Abscess to left lower buttock   Neurological: Negative for weakness and headaches.  All other systems reviewed and are negative.    Allergies  Food and Pork-derived products  Home Medications   Current Outpatient Rx  Name  Route  Sig  Dispense  Refill  . albuterol (PROVENTIL HFA;VENTOLIN HFA) 108 (90 BASE) MCG/ACT inhaler   Inhalation   Inhale 2 puffs into the lungs every 6 (six) hours as needed. For shortness of breath   1 Inhaler   1   . atorvastatin (LIPITOR) 20 MG tablet   Oral   Take 20 mg by mouth every evening.          . bimatoprost (LUMIGAN) 0.01 % SOLN   Both Eyes   Place 1 drop into both eyes at bedtime.         . brimonidine (ALPHAGAN P) 0.1 % SOLN   Both Eyes   Place  1 drop into both eyes 2 (two) times daily.          . brinzolamide (AZOPT) 1 % ophthalmic suspension   Both Eyes   Place 1 drop into both eyes 2 (two) times daily.         . insulin aspart (NOVOLOG FLEXPEN) 100 UNIT/ML injection   Subcutaneous   Inject 5-7 Units into the skin 3 (three) times daily before meals. Use insulin sliding scale per protocol.         . insulin glargine (LANTUS) 100 UNIT/ML injection   Subcutaneous   Inject 45 Units into the skin at bedtime.         Marland Kitchen ipratropium (ATROVENT) 0.06 % nasal spray   Nasal   Place 2 sprays into the nose 4 (four) times daily.   15 mL   1   . levocetirizine (XYZAL) 5 MG tablet   Oral   Take 5 mg by mouth every evening.         . naproxen sodium (ANAPROX) 220 MG tablet    Oral   Take 440 mg by mouth 2 (two) times daily with a meal.         . ramipril (ALTACE) 10 MG tablet   Oral   Take 10 mg by mouth daily.         Marland Kitchen telmisartan-hydrochlorothiazide (MICARDIS HCT) 80-25 MG per tablet   Oral   Take 1 tablet by mouth daily.         . insulin aspart (NOVOLOG) 100 UNIT/ML injection   Subcutaneous   Inject 5-7 Units into the skin 3 (three) times daily before meals.   1 vial   12   . insulin glargine (LANTUS) 100 UNIT/ML injection   Subcutaneous   Inject 0.45 mLs (45 Units total) into the skin at bedtime.   10 mL   12   . metFORMIN (GLUCOPHAGE) 1000 MG tablet   Oral   Take 1 tablet (1,000 mg total) by mouth 2 (two) times daily with a meal.   60 tablet   0     Triage Vitals: BP 164/65  Pulse 71  Temp(Src) 98.2 F (36.8 C) (Oral)  Resp 18  SpO2 96%  Physical Exam  Nursing note and vitals reviewed. Constitutional: She is oriented to person, place, and time. She appears well-developed and well-nourished. No distress.  HENT:  Head: Normocephalic and atraumatic.  Eyes: EOM are normal. Pupils are equal, round, and reactive to light.  Neck: Normal range of motion. Neck supple. No tracheal deviation present.  Cardiovascular: Normal rate.   Pulmonary/Chest: Effort normal. No respiratory distress.  Abdominal: Soft. She exhibits no distension.  Genitourinary:     Musculoskeletal: Normal range of motion. She exhibits no edema.  Neurological: She is alert and oriented to person, place, and time.  Skin: Skin is warm and dry.  Psychiatric: She has a normal mood and affect. Her behavior is normal.    ED Course  Procedures (including critical care time)  DIAGNOSTIC STUDIES: Oxygen Saturation is 96% on room air, adequate by my interpretation.    COORDINATION OF CARE:  10:34 PM: Discussed treatment plan which includes an I&D, glucose, CBC, BMP and UA with pt at bedside and pt agreed to plan.   INCISION AND DRAINAGE Performed by:  Dorthula Matas Consent: Verbal consent obtained. Risks and benefits: risks, benefits and alternatives were discussed Type: abscess  Body area: left groi  Anesthesia: local infiltration  Incision was made with a scalpel.  Local anesthetic: lidocaine 2% with epinephrine  Anesthetic total: 3 ml  Complexity: complex Blunt dissection to break up loculations  Drainage: purulent  Drainage amount: 2 ml  Packing material: 1/4 in iodoform gauze  Patient tolerance: Patient tolerated the procedure well with no immediate complications.     Labs Reviewed  GLUCOSE, CAPILLARY - Abnormal; Notable for the following:    Glucose-Capillary 342 (*)    All other components within normal limits  URINALYSIS, ROUTINE W REFLEX MICROSCOPIC - Abnormal; Notable for the following:    Specific Gravity, Urine 1.040 (*)    Glucose, UA >1000 (*)    Protein, ur 30 (*)    All other components within normal limits  URINE MICROSCOPIC-ADD ON - Abnormal; Notable for the following:    Squamous Epithelial / LPF FEW (*)    All other components within normal limits   No results found.   1. Hyperglycemia without ketosis   2. Abscess       MDM  pts sugar is elevated. She has been out of her insulin because she ran out of a prescription for them. She has been feeling tired and gets abscesses when her sugars are too high.   Will move patient to exam room, do I&D, give a liter of fluids and her Rx insulins then recheck.  12:28 AM Sugar is now 244. Patient safe to DC after fluid bolus.   Rx: Novo log and Lantus, Metformin refills. Abx for cellulitis/abscess and antifungal because abx give yeast infection.   Referral to PCP given.  Pt has been advised of the symptoms that warrant their return to the ED. Patient has voiced understanding and has agreed to follow-up with the PCP or specialist.   I personally performed the services described in this documentation, which was scribed in my presence. The  recorded information has been reviewed and is accurate.   Dorthula Matas, PA-C 12/19/12 0030

## 2012-12-18 NOTE — ED Notes (Signed)
[id number]<AV> Expected date:<BR> Expected time:<BR> Means of arrival:<BR> Comments:<BR> Triage 5

## 2012-12-18 NOTE — ED Notes (Signed)
Pt states Saturday developed a boil on L lower buttox area, states has been seeing if it would "bust", but states now when she urinates it burns at the boil site, pt states she is a diabetic and is scared it might become infected.

## 2012-12-19 LAB — GLUCOSE, CAPILLARY: Glucose-Capillary: 244 mg/dL — ABNORMAL HIGH (ref 70–99)

## 2012-12-19 MED ORDER — SULFAMETHOXAZOLE-TRIMETHOPRIM 800-160 MG PO TABS: 1.0000 | ORAL_TABLET | Freq: Two times a day (BID) | ORAL | Status: DC

## 2012-12-19 MED ORDER — FLUCONAZOLE 200 MG PO TABS: 200.0000 mg | ORAL_TABLET | Freq: Every day | ORAL | Status: AC

## 2012-12-19 MED ORDER — INSULIN ASPART 100 UNIT/ML FLEXPEN: 5.0000 [IU] | Freq: Three times a day (TID) | SUBCUTANEOUS | Status: DC

## 2012-12-19 MED ORDER — INSULIN GLARGINE 100 UNIT/ML ~~LOC~~ SOLN: 45.0000 [IU] | Freq: Every day | SUBCUTANEOUS | Status: DC

## 2012-12-19 MED ORDER — INSULIN ASPART 100 UNIT/ML ~~LOC~~ SOLN: 5.0000 [IU] | Freq: Three times a day (TID) | SUBCUTANEOUS | Status: DC

## 2012-12-19 NOTE — ED Provider Notes (Signed)
Medical screening examination/treatment/procedure(s) were performed by non-physician practitioner and as supervising physician I was immediately available for consultation/collaboration.   Marielis Samara M Yatzary Merriweather, DO 12/19/12 2035 

## 2012-12-19 NOTE — ED Notes (Signed)
Pt. CBG 244. Notified RN, Costella Hatcher.

## 2012-12-20 LAB — POCT I-STAT, CHEM 8
Calcium, Ion: 1.16 mmol/L (ref 1.12–1.23)
Chloride: 107 mEq/L (ref 96–112)
Creatinine, Ser: 1.1 mg/dL (ref 0.50–1.10)
Glucose, Bld: 344 mg/dL — ABNORMAL HIGH (ref 70–99)
HCT: 35 % — ABNORMAL LOW (ref 36.0–46.0)
Hemoglobin: 11.9 g/dL — ABNORMAL LOW (ref 12.0–15.0)

## 2013-01-22 ENCOUNTER — Other Ambulatory Visit: Payer: Self-pay | Admitting: Family Medicine

## 2013-01-22 ENCOUNTER — Ambulatory Visit: Payer: BC Managed Care – PPO | Admitting: Family Medicine

## 2013-01-22 VITALS — BP 145/76 | HR 76 | Temp 98.1°F | Resp 20 | Ht 62.0 in | Wt 245.8 lb

## 2013-01-22 DIAGNOSIS — E119 Type 2 diabetes mellitus without complications: Secondary | ICD-10-CM

## 2013-01-22 DIAGNOSIS — R200 Anesthesia of skin: Secondary | ICD-10-CM

## 2013-01-22 DIAGNOSIS — R079 Chest pain, unspecified: Secondary | ICD-10-CM

## 2013-01-22 DIAGNOSIS — H409 Unspecified glaucoma: Secondary | ICD-10-CM

## 2013-01-22 DIAGNOSIS — R202 Paresthesia of skin: Secondary | ICD-10-CM

## 2013-01-22 DIAGNOSIS — I10 Essential (primary) hypertension: Secondary | ICD-10-CM

## 2013-01-22 DIAGNOSIS — R209 Unspecified disturbances of skin sensation: Secondary | ICD-10-CM

## 2013-01-22 LAB — POCT GLYCOSYLATED HEMOGLOBIN (HGB A1C): Hemoglobin A1C: 13.5

## 2013-01-22 MED ORDER — BRINZOLAMIDE 1 % OP SUSP: 1.0000 [drp] | Freq: Two times a day (BID) | OPHTHALMIC | Status: DC

## 2013-01-22 MED ORDER — BRIMONIDINE TARTRATE 0.1 % OP SOLN: 1.0000 [drp] | Freq: Two times a day (BID) | OPHTHALMIC | Status: DC

## 2013-01-22 MED ORDER — LISINOPRIL-HYDROCHLOROTHIAZIDE 20-12.5 MG PO TABS: 1.0000 | ORAL_TABLET | Freq: Every day | ORAL | Status: DC

## 2013-01-22 MED ORDER — BIMATOPROST 0.01 % OP SOLN: 1.0000 [drp] | Freq: Every day | OPHTHALMIC | Status: DC

## 2013-01-22 NOTE — Progress Notes (Signed)
57 yo woman with intermittent right hand numbness, chest tightness, and right thigh pain.  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  PMHx:  Diabetes, h/o achilles rupture, hypertension, hyperlipidemia  Objective:  NAD Neck:  Supple, no bruit, no adenopathy Chest:  Clear Heart: II/VI systolic murmur best heard at RSB Ext:  2+ bilateral ankle edema, monofilament normal   Results for orders placed in visit on 01/22/13  POCT GLYCOSYLATED HEMOGLOBIN (HGB A1C)      Result Value Range   Hemoglobin A1C 13.5     Assessment:  Mild neuropathy with multiple problems stemming from obesity and noncompliance with medication  Plan:  Encourage compliance, CPE 1 month Chest pain - Plan: D-dimer, quantitative, Ambulatory referral to Cardiology, Sedimentation Rate  Numbness and tingling in right hand - Plan: Comprehensive metabolic panel, Sedimentation Rate  Diabetes - Plan: POCT glycosylated hemoglobin (Hb A1C), Sedimentation Rate  Hypertension - Plan: lisinopril-hydrochlorothiazide (ZESTORETIC) 20-12.5 MG per tablet, Sedimentation Rate  Glaucoma - Plan: bimatoprost (LUMIGAN) 0.01 % SOLN, brimonidine (ALPHAGAN P) 0.1 % SOLN, brinzolamide (AZOPT) 1 % ophthalmic suspension, Sedimentation Rate    Signed,  Elvina Sidle, mD

## 2013-01-23 LAB — COMPREHENSIVE METABOLIC PANEL
ALT: 11 U/L (ref 0–35)
AST: 11 U/L (ref 0–37)
Albumin: 4 g/dL (ref 3.5–5.2)
Alkaline Phosphatase: 71 U/L (ref 39–117)
BUN: 19 mg/dL (ref 6–23)
CO2: 26 mEq/L (ref 19–32)
Calcium: 9.5 mg/dL (ref 8.4–10.5)
Chloride: 97 mEq/L (ref 96–112)
Creat: 1.19 mg/dL — ABNORMAL HIGH (ref 0.50–1.10)
Glucose, Bld: 541 mg/dL (ref 70–99)
Potassium: 4.5 mEq/L (ref 3.5–5.3)
Sodium: 134 mEq/L — ABNORMAL LOW (ref 135–145)
Total Bilirubin: 0.3 mg/dL (ref 0.3–1.2)
Total Protein: 7.7 g/dL (ref 6.0–8.3)

## 2013-01-23 LAB — D-DIMER, QUANTITATIVE: D-Dimer, Quant: 0.27 ug/mL-FEU (ref 0.00–0.48)

## 2013-01-23 LAB — SEDIMENTATION RATE: Sed Rate: 84 mm/hr — ABNORMAL HIGH (ref 0–22)

## 2013-01-27 ENCOUNTER — Other Ambulatory Visit: Payer: Self-pay | Admitting: Family Medicine

## 2013-01-27 DIAGNOSIS — R768 Other specified abnormal immunological findings in serum: Secondary | ICD-10-CM

## 2013-01-27 LAB — ANTI-NUCLEAR AB-TITER (ANA TITER): ANA Titer 1: 1:80 {titer} — ABNORMAL HIGH

## 2013-01-27 LAB — ANA: Anti Nuclear Antibody(ANA): POSITIVE — AB

## 2013-01-28 ENCOUNTER — Telehealth: Payer: Self-pay

## 2013-01-28 NOTE — Telephone Encounter (Signed)
Called her about the labs and the referral to Rheumatology.

## 2013-01-28 NOTE — Telephone Encounter (Signed)
RETURN CALL ABOUT LABS. [phone]

## 2013-02-10 ENCOUNTER — Other Ambulatory Visit: Payer: Self-pay

## 2013-02-10 MED ORDER — QUINAPRIL-HYDROCHLOROTHIAZIDE 20-12.5 MG PO TABS: 1.0000 | ORAL_TABLET | Freq: Every day | ORAL | Status: DC

## 2013-02-10 MED ORDER — MOMETASONE FUROATE 50 MCG/ACT NA SUSP: 2.0000 | Freq: Every day | NASAL | Status: DC

## 2013-02-10 MED ORDER — FLUTICASONE-SALMETEROL 250-50 MCG/DOSE IN AEPB: 1.0000 | INHALATION_SPRAY | Freq: Two times a day (BID) | RESPIRATORY_TRACT | Status: DC

## 2013-02-10 NOTE — Telephone Encounter (Signed)
Faxed Rxs to H Dept.

## 2013-02-11 ENCOUNTER — Other Ambulatory Visit: Payer: Self-pay

## 2013-02-11 MED ORDER — ALBUTEROL SULFATE HFA 108 (90 BASE) MCG/ACT IN AERS: 2.0000 | INHALATION_SPRAY | Freq: Four times a day (QID) | RESPIRATORY_TRACT | Status: DC | PRN

## 2013-02-18 ENCOUNTER — Encounter: Payer: Self-pay | Admitting: Cardiovascular Disease

## 2013-02-18 ENCOUNTER — Ambulatory Visit (INDEPENDENT_AMBULATORY_CARE_PROVIDER_SITE_OTHER): Payer: BC Managed Care – PPO | Admitting: Cardiovascular Disease

## 2013-02-18 VITALS — BP 140/70 | HR 60 | Ht 61.0 in | Wt 246.0 lb

## 2013-02-18 DIAGNOSIS — E785 Hyperlipidemia, unspecified: Secondary | ICD-10-CM

## 2013-02-18 DIAGNOSIS — R079 Chest pain, unspecified: Secondary | ICD-10-CM

## 2013-02-18 DIAGNOSIS — I10 Essential (primary) hypertension: Secondary | ICD-10-CM

## 2013-02-18 DIAGNOSIS — E119 Type 2 diabetes mellitus without complications: Secondary | ICD-10-CM

## 2013-02-18 NOTE — Patient Instructions (Signed)
Your physician recommends that you schedule a follow-up appointment in:  AS NEEDED  Your physician recommends that you continue on your current medications as directed. Please refer to the Current Medication list given to you today. You have been referred to ENDO  DR  [facility]  DX DIABETES  Your physician has requested that you have a lexiscan myoview. For further information please visit [email or web address] Please follow instruction sheet, as given.

## 2013-02-18 NOTE — Progress Notes (Signed)
Patient ID: [name], female   DOB: [birth year], 57 y.o.   MRN: [id number] 57 yo referred for chest pain  Pain is very atypical Non exertional  Sharp can be in back shoulders arm and thigh.  She is overweight  She is very depressed and is seeking psychiatric help.  She is not very good about controlling her DM.  Long discussion about lifestyle habits and priority of BS control and weight loss.  She is still taking classes at [employer] which is also a stress.  No documented CAD or DCM   Lab work shows elevated ESR 83 and she has been referred to Rheum  ROS: Denies fever, malais, weight loss, blurry vision, decreased visual acuity, cough, sputum, SOB, hemoptysis, pleuritic pain, palpitaitons, heartburn, abdominal pain, melena, lower extremity edema, claudication, or rash.  All other systems reviewed and negative   General: Affect appropriate Obese black female HEENT: normal Neck supple with no adenopathy JVP normal no bruits no thyromegaly Lungs clear with no wheezing and good diaphragmatic motion Heart:  S1/S2 no murmur,rub, gallop or click PMI normal Abdomen: benighn, BS positve, no tenderness, no AAA no bruit.  No HSM or HJR Distal pulses intact with no bruits No edema Neuro non-focal Skin warm and dry No muscular weakness  Medications Current Outpatient Prescriptions  Medication Sig Dispense Refill  . albuterol (PROVENTIL HFA;VENTOLIN HFA) 108 (90 BASE) MCG/ACT inhaler Inhale 2 puffs into the lungs every 6 (six) hours as needed. For shortness of breath  3 Inhaler  3  . atorvastatin (LIPITOR) 20 MG tablet Take 20 mg by mouth every evening.       . bimatoprost (LUMIGAN) 0.01 % SOLN Place 1 drop into both eyes at bedtime.  5 mL  10  . brimonidine (ALPHAGAN P) 0.1 % SOLN Place 1 drop into both eyes 2 (two) times daily.  10 mL  10  . brinzolamide (AZOPT) 1 % ophthalmic suspension Place 1 drop into both eyes 2 (two) times daily.  10 mL  10  . Fluticasone-Salmeterol (ADVAIR) 250-50 MCG/DOSE AEPB  Inhale 1 puff into the lungs 2 (two) times daily.  180 each  3  . insulin aspart (NOVOLOG FLEXPEN) 100 UNIT/ML injection Inject 5-7 Units into the skin 3 (three) times daily before meals. Use insulin sliding scale per protocol.      . insulin glargine (LANTUS) 100 UNIT/ML injection Inject 45 Units into the skin at bedtime.      Marland Kitchen ipratropium (ATROVENT) 0.06 % nasal spray Place 2 sprays into the nose 4 (four) times daily.  15 mL  1  . levocetirizine (XYZAL) 5 MG tablet Take 5 mg by mouth every evening.      . metFORMIN (GLUCOPHAGE) 1000 MG tablet Take 1 tablet (1,000 mg total) by mouth 2 (two) times daily with a meal.  60 tablet  0  . mometasone (NASONEX) 50 MCG/ACT nasal spray Place 2 sprays into the nose daily.  51 g  3  . naproxen sodium (ANAPROX) 220 MG tablet Take 440 mg by mouth 2 (two) times daily with a meal.      . telmisartan-hydrochlorothiazide (MICARDIS HCT) 80-25 MG per tablet Take 1 tablet by mouth daily.       No current facility-administered medications for this visit.    Allergies Food and Pork-derived products  Family History: No family history on file.  Social History: History   Social History  . Marital Status: Single    Spouse Name: N/A    Number of Children:  N/A  . Years of Education: N/A   Occupational History  . Not on file.   Social History Main Topics  . Smoking status: Former Games developer  . Smokeless tobacco: Not on file  . Alcohol Use: Yes     Comment: occasional  . Drug Use: No  . Sexually Active: Yes    Birth Control/ Protection: None   Other Topics Concern  . Not on file   Social History Narrative  . No narrative on file    Electrocardiogram:  10/13  SR rate 57 normal   Assessment and Plan

## 2013-02-18 NOTE — Assessment & Plan Note (Signed)
Cholesterol is at goal.  Continue current dose of statin and diet Rx.  No myalgias or side effects.  F/U  LFT's in 6 months. Lab Results  Component Value Date   LDLCALC 120* 05/02/2010

## 2013-02-18 NOTE — Assessment & Plan Note (Signed)
Well controlled.  Continue current medications and low sodium Dash type diet.    

## 2013-02-18 NOTE — Assessment & Plan Note (Addendum)
Atypical But poorly controlled DM Unable to walk on treadmill due to Achilles problem Lexiscan 2 day myovue Will likely have breast attenuation but should be reasonable test to r/o CAD  Suspect it is muscular and related to elevated ESR ? Lupus

## 2013-02-27 ENCOUNTER — Ambulatory Visit (HOSPITAL_COMMUNITY): Payer: BC Managed Care – PPO | Attending: Cardiology | Admitting: Radiology

## 2013-02-27 VITALS — BP 144/62 | Ht 61.0 in | Wt 248.0 lb

## 2013-02-27 DIAGNOSIS — R0609 Other forms of dyspnea: Secondary | ICD-10-CM | POA: Insufficient documentation

## 2013-02-27 DIAGNOSIS — R42 Dizziness and giddiness: Secondary | ICD-10-CM | POA: Insufficient documentation

## 2013-02-27 DIAGNOSIS — E785 Hyperlipidemia, unspecified: Secondary | ICD-10-CM | POA: Insufficient documentation

## 2013-02-27 DIAGNOSIS — R0989 Other specified symptoms and signs involving the circulatory and respiratory systems: Secondary | ICD-10-CM | POA: Insufficient documentation

## 2013-02-27 DIAGNOSIS — R0602 Shortness of breath: Secondary | ICD-10-CM

## 2013-02-27 DIAGNOSIS — I10 Essential (primary) hypertension: Secondary | ICD-10-CM | POA: Insufficient documentation

## 2013-02-27 DIAGNOSIS — Z87891 Personal history of nicotine dependence: Secondary | ICD-10-CM | POA: Insufficient documentation

## 2013-02-27 DIAGNOSIS — E669 Obesity, unspecified: Secondary | ICD-10-CM | POA: Insufficient documentation

## 2013-02-27 DIAGNOSIS — R55 Syncope and collapse: Secondary | ICD-10-CM | POA: Insufficient documentation

## 2013-02-27 DIAGNOSIS — R079 Chest pain, unspecified: Secondary | ICD-10-CM | POA: Insufficient documentation

## 2013-02-27 DIAGNOSIS — Z794 Long term (current) use of insulin: Secondary | ICD-10-CM | POA: Insufficient documentation

## 2013-02-27 DIAGNOSIS — E119 Type 2 diabetes mellitus without complications: Secondary | ICD-10-CM | POA: Insufficient documentation

## 2013-02-27 DIAGNOSIS — Z8249 Family history of ischemic heart disease and other diseases of the circulatory system: Secondary | ICD-10-CM | POA: Insufficient documentation

## 2013-02-27 MED ORDER — REGADENOSON 0.4 MG/5ML IV SOLN
0.4000 mg | Freq: Once | INTRAVENOUS | Status: AC
  Administered 2013-02-27: 0.4 mg via INTRAVENOUS

## 2013-02-27 MED ORDER — TECHNETIUM TC 99M SESTAMIBI GENERIC - CARDIOLITE
33.0000 | Freq: Once | INTRAVENOUS | Status: AC | PRN
  Administered 2013-02-27: 33 via INTRAVENOUS

## 2013-02-27 NOTE — Progress Notes (Signed)
[facility] SITE 3 NUCLEAR MED [address] [place], [place] [address] [phone]    Cardiology Nuclear Med Study  [name] is a 57 y.o. female     MRN : [id number]     DOB: 06/30/1956  Procedure Date: 02/27/2013  Nuclear Med Background Indication for Stress Test:  Evaluation for Ischemia History:  2010 Echo EF 60-65% Cardiac Risk Factors: Family History - CAD, History of Smoking, Hypertension, IDDM Type 2, Lipids and Obesity  Symptoms:  Chest Pain, DOE, Light-Headedness and Near Syncope   Nuclear Pre-Procedure Caffeine/Decaff Intake:  None NPO After: 8:00pm   Lungs:  clear O2 Sat: 99% on room air. IV 0.9% NS with Angio Cath:  22g  IV Site: R Forearm  IV Started by:  Doyne Keel, CNMT  Chest Size (in):  44 Cup Size: H  Height: 5\' 1"  (1.549 m)  Weight:  248 lb (112.492 kg)  BMI:  Body mass index is 46.88 kg/(m^2). Tech Comments: N/A    Nuclear Med Study 1 or 2 day study: 2 day  Stress Test Type:  Lexiscan  Reading MD: Marca Ancona, MD Order Authorizing Provider:  Charlton Haws, MD  Resting Radionuclide: Technetium 95m Sestamibi  Resting Radionuclide Dose: 33.0 mCi on 03/05/13   Stress Radionuclide:  Technetium 105m Sestamibi  Stress Radionuclide Dose: 33.0 mCi on 02/27/13           Stress Protocol Rest HR: 57 Stress HR: 63  Rest BP: 144/62 Stress BP: 208/95  Exercise Time (min): n/a METS: n/a   Predicted Max HR: 164 bpm % Max HR: 68.29 bpm Rate Pressure Product: 29528   Dose of Adenosine (mg):  n/a Dose of Lexiscan: 0.4 mg  Dose of Atropine (mg): n/a Dose of Dobutamine: n/a mcg/kg/min (at max HR)  Stress Test Technologist: Bonnita Levan, RN  Nuclear Technologist:  Domenic Polite, CNMT     Rest Procedure:  Myocardial perfusion imaging was performed at rest 45 minutes following the intravenous administration of Technetium 18m Sestamibi. Rest ECG: NSR - Normal EKG  Stress Procedure:  The patient received IV Lexiscan 0.4 mg over 15-seconds.  Technetium  50m Sestamibi injected at 30-seconds.  Quantitative spect images were obtained after a 45 minute delay. Stress ECG: No significant change from baseline ECG  QPS Raw Data Images:  Normal; no motion artifact; normal heart/lung ratio. Stress Images:  Normal homogeneous uptake in all areas of the myocardium. Rest Images:  Normal homogeneous uptake in all areas of the myocardium. Subtraction (SDS):  There is no evidence of scar or ischemia. Transient Ischemic Dilatation (Normal <1.22):  1.13 Lung/Heart Ratio (Normal <0.45):  0.22  Quantitative Gated Spect Images QGS EDV:  79 ml QGS ESV:  23 ml  Impression Exercise Capacity:  Lexiscan with no exercise. BP Response:  Hypertensive blood pressure response. Clinical Symptoms:  Nausea ECG Impression:  No significant ST segment change suggestive of ischemia. Comparison with Prior Nuclear Study: No images to compare  Overall Impression:  Normal stress nuclear study.  LV Ejection Fraction: 71%.  LV Wall Motion:  NL LV Function; NL Wall Motion  Marca Ancona 03/05/2013

## 2013-03-05 ENCOUNTER — Ambulatory Visit (HOSPITAL_COMMUNITY): Payer: BC Managed Care – PPO | Attending: Cardiovascular Disease

## 2013-03-05 DIAGNOSIS — R0989 Other specified symptoms and signs involving the circulatory and respiratory systems: Secondary | ICD-10-CM

## 2013-03-05 MED ORDER — TECHNETIUM TC 99M SESTAMIBI GENERIC - CARDIOLITE
33.0000 | Freq: Once | INTRAVENOUS | Status: AC | PRN
  Administered 2013-03-05: 33 via INTRAVENOUS

## 2013-03-20 ENCOUNTER — Ambulatory Visit (INDEPENDENT_AMBULATORY_CARE_PROVIDER_SITE_OTHER): Payer: BC Managed Care – PPO | Admitting: Internal Medicine

## 2013-03-20 ENCOUNTER — Encounter: Payer: Self-pay | Admitting: Internal Medicine

## 2013-03-20 VITALS — BP 122/68 | HR 72 | Temp 98.7°F | Resp 12 | Ht 62.0 in | Wt 244.0 lb

## 2013-03-20 DIAGNOSIS — E119 Type 2 diabetes mellitus without complications: Secondary | ICD-10-CM

## 2013-03-20 MED ORDER — ACCU-CHEK FASTCLIX LANCETS MISC: 1.0000 | Freq: Four times a day (QID) | Status: DC

## 2013-03-20 MED ORDER — GLUCOSE BLOOD VI STRP: ORAL_STRIP | Status: DC

## 2013-03-20 NOTE — Patient Instructions (Signed)
Please check sugars 3-4 x a day and write them down. Continue current regimen of insulin and Metformin for now. May increase mealtime insulin if sugars high (>150)  before next meal.  PATIENT INSTRUCTIONS FOR TYPE 2 DIABETES:  **Please join MyChart!** - see attached instructions about how to join   DIET AND EXERCISE Diet and exercise is an important part of diabetic treatment.  We recommended aerobic exercise in the form of brisk walking (working between 40-60% of maximal aerobic capacity, similar to brisk walking) for 150 minutes per week (such as 30 minutes five days per week) along with 3 times per week performing 'resistance' training (using various gauge rubber tubes with handles) 5-10 exercises involving the major muscle groups (upper body, lower body and core) performing 10-15 repetitions (or near fatigue) each exercise. Start at half the above goal but build slowly to reach the above goals. If limited by weight, joint pain, or disability, we recommend daily walking in a swimming pool with water up to waist to reduce pressure from joints while allow for adequate exercise.    BLOOD GLUCOSES Monitoring your blood glucoses is important for continued management of your diabetes. Please check your blood glucoses 2-4 times a day: fasting, before meals and at bedtime (you can rotate these measurements - e.g. one day check before the 3 meals, the next day check before 2 of the meals and before bedtime, etc.   HYPOGLYCEMIA (low blood sugar) Hypoglycemia is usually a reaction to not eating, exercising, or taking too much insulin/ other diabetes drugs.  Symptoms include tremors, sweating, hunger, confusion, headache, etc. Treat IMMEDIATELY with 15 grams of Carbs:   4 glucose tablets    cup regular juice/soda   2 tablespoons raisins   4 teaspoons sugar   1 tablespoon honey Recheck blood glucose in 15 mins and repeat above if still symptomatic/blood glucose <100. Please contact our office at  [phone] if you have questions about how to next handle your insulin.  RECOMMENDATIONS TO REDUCE YOUR RISK OF DIABETIC COMPLICATIONS: * Take your prescribed MEDICATION(S). * Follow a DIABETIC diet: Complex carbs, fiber rich foods, heart healthy fish twice weekly, (monounsaturated and polyunsaturated) fats * AVOID saturated/trans fats, high fat foods, >2,300 mg salt per day. * EXERCISE at least 5 times a week for 30 minutes or preferably daily.  * DO NOT SMOKE OR DRINK more than 1 drink a day. * Check your FEET every day. Do not wear tightfitting shoes. Contact us if you develop an ulcer * See your EYE doctor once a year or more if needed * Get a FLU shot once a year * Get a PNEUMONIA vaccine once before and once after age 17 years  GOALS:  * Your Hemoglobin A1c of <7%  * Your Systolic BP should be 140 or lower  * Your Diastolic BP should be 80 or lower  * Your HDL (Good Cholesterol) should be 40 or higher  * Your LDL (Bad Cholesterol) should be 100 or lower  * Your Triglycerides should be 150 or lower  * Your Urine microalbumin (kidney function) should be <30 * Your Body Mass Index should be 25 or lower   We will be glad to help you achieve these goals. Our telephone number is: [phone].

## 2013-03-20 NOTE — Progress Notes (Signed)
Patient ID: [name], female   DOB: 05/23/1956, 57 y.o.   MRN: [id number]  HPI: [name] is a 57 y.o.-year-old female, referred by Dr Eden Emms, for management of DM2, non-insulin-dependent, uncontrolled, without complications.  Patient has been diagnosed with GDM in 1991, and diabetes in 1998; she started Insulin in 1999. Last hemoglobin A1c was: Lab Results  Component Value Date   HGBA1C 13.5 01/22/2013  She only had this around 8% one time.  She tells me she had a period of depression in which she did not take medicines other than Lantus x 2 months after her B'day (11/2012). She ran out of Lantus though x 3 weeks around that time, OTW, she was taking it every day.   Pt is on a regimen of: - Metformin 1000 mg po bid - sometimes diarrhea, so might take first dose at lunch, not breakfast - Lantus 45 units qhs - does not miss doses - Novolog 5-7 units tid ac - depending on the size of her meal No problems with inj sites. Keeps insulin in use out of the fridge.  She had a One Touch and an Accuchek meter in the past, not anymore. Pt does not check sugars x 1 mo. Before this:: - am: 80-100 - after meals: 150-160 "on good days" Later it has been 300. Lowest sugar was 50-60; sometimes at night; she has hypoglycemia awareness at 70. Highest sugar was 541, 2 months ago.  Goes to school at night, sometimes does not eat dinner.  Pt's meals are: - Breakfast: grits/oatmeal or leftover beef bacon or salmon cakes - Lunch: baked chicken or hamburger or beef + salad/rice/onion rings - Dinner: same + veggies - Snacks: crackers, PB, cheese, chips Saw [facility] Large with Nutrition 01/10/2013. For exercise, walks on treadmill x 20-25 min at gym. She does this 2-3x a week. She is looking at water aerobics. She had Achilles tendon ruptures in the past.  Pt does not have chronic kidney disease, last BUN/creatinine was:  Lab Results  Component Value Date   BUN 19 01/22/2013   CREATININE 1.19* 01/22/2013    Last set of lipids: Lab Results  Component Value Date   CHOL 197 05/02/2010   HDL 59 05/02/2010   LDLCALC 120* 05/02/2010   TRIG 88 05/02/2010   CHOLHDL 3.3 Ratio 05/02/2010   Pt's last eye exam was last month.  Has a new appt in 1 week. No DR. Denies numbness and tingling in her legs.  I reviewed her chart and she also has a history of HTN, HL - on Atorvastatin 20, glaucoma, atypical CP (seen by Dr Eden Emms 1 mo ago), OSA.  Pt has FH of DM in mother, aunt.   ROS: Constitutional: no weight gain/loss, + fatigue, + hot flushes, poor sleep; no polyuria/increased thirst Eyes: no blurry vision, no xerophthalmia ENT: no sore throat, no nodules palpated in throat, no dysphagia/odynophagia, no hoarseness Cardiovascular: no CP/SOB/palpitations/+ leg swelling Respiratory: no cough/+SOB and wheezing Gastrointestinal: no N/V/+D/C Musculoskeletal: + muscle/+ joint aches Skin: no rashes Neurological: no tremors/numbness/tingling/dizziness, + HA Psychiatric: + depression/+ anxiety  Past Medical History  Diagnosis Date  . Diabetes mellitus 1999    T2DM  . H/O gestational diabetes mellitus, not currently pregnant   . Asthma   . Hyperlipidemia   . Hypertension   . Obesity   . OSA (obstructive sleep apnea)   . Glaucoma    Past Surgical History  Procedure Laterality Date  . Cholecystectomy  1991  . Cesarean section  12/10/89  .  Breast lumpectomy      Right breast   History   Social History  . 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 Smoking status: Former Games developer  . Smokeless tobacco: Not on file  . Alcohol Use: Yes     Comment: occasional  . Drug Use: No  . Sexually Active: Yes    Birth Control/ Protection: None   Current Outpatient Prescriptions on File Prior to Visit  Medication Sig Dispense Refill  . albuterol (PROVENTIL HFA;VENTOLIN HFA) 108 (90 BASE) MCG/ACT  inhaler Inhale 2 puffs into the lungs every 6 (six) hours as needed. For shortness of breath  3 Inhaler  3  . atorvastatin (LIPITOR) 20 MG tablet Take 20 mg by mouth every evening.       . bimatoprost (LUMIGAN) 0.01 % SOLN Place 1 drop into both eyes at bedtime.  5 mL  10  . brimonidine (ALPHAGAN P) 0.1 % SOLN Place 1 drop into both eyes 2 (two) times daily.  10 mL  10  . brinzolamide (AZOPT) 1 % ophthalmic suspension Place 1 drop into both eyes 2 (two) times daily.  10 mL  10  . Fluticasone-Salmeterol (ADVAIR) 250-50 MCG/DOSE AEPB Inhale 1 puff into the lungs 2 (two) times daily.  180 each  3  . insulin aspart (NOVOLOG FLEXPEN) 100 UNIT/ML injection Inject 5-7 Units into the skin 3 (three) times daily before meals. Use insulin sliding scale per protocol.      . insulin glargine (LANTUS) 100 UNIT/ML injection Inject 45 Units into the skin at bedtime.      Marland Kitchen ipratropium (ATROVENT) 0.06 % nasal spray Place 2 sprays into the nose 4 (four) times daily.  15 mL  1  . levocetirizine (XYZAL) 5 MG tablet Take 5 mg by mouth every evening.      . metFORMIN (GLUCOPHAGE) 1000 MG tablet Take 1 tablet (1,000 mg total) by mouth 2 (two) times daily with a meal.  60 tablet  0  . mometasone (NASONEX) 50 MCG/ACT nasal spray Place 2 sprays into the nose daily.  51 g  3  . naproxen sodium (ANAPROX) 220 MG tablet Take 440 mg by mouth 2 (two) times daily with a meal.      . telmisartan-hydrochlorothiazide (MICARDIS HCT) 80-25 MG per tablet Take 1 tablet by mouth daily.       No current facility-administered medications on file prior to visit.   Allergies  Allergen Reactions  . Food Nausea Only    Eggs   . Pork-Derived Products    PE: BP 122/68  Pulse 72  Temp(Src) 98.7 F (37.1 C) (Oral)  Resp 12  Ht 5\' 2"  (1.575 m)  Wt 244 lb (110.678 kg)  BMI 44.62 kg/m2  SpO2 97% Wt Readings from Last 3 Encounters:  03/20/13 244 lb (110.678 kg)  02/27/13 248 lb (112.492 kg)  02/18/13 246 lb (111.585 kg)    Constitutional: overweight, in NAD, walks with a cane, walks with a cane Eyes: PERRLA, EOMI, no exophthalmos ENT: moist mucous membranes, no thyromegaly, no cervical lymphadenopathy Cardiovascular: RRR, No MRG Respiratory: CTA B Gastrointestinal: abdomen soft, NT, ND, BS+ Musculoskeletal: no deformities, strength intact in all 4; full supraclavicular fat pads Skin: moist, warm, no rashes Neurological: no tremor with outstretched hands, DTR normal in all 4  ASSESSMENT: 1. DM2, insulin-dependent, uncontrolled, without complications  PLAN:  1. Pt with long standing DM2, with h/o medication noncompliance. She is trying to gain control of her diabetes now, after an episode of depression that made her indifferent towards her disease and not taking his medicines. She does not have a meter to check her sugars, so I gave her an AccuChek Nano meter and sent strips and lancet Rx to her pharmacy. I underlined the need to check sugars at several points in the day to be able to adjust her insulin. - I think her pancreas reserve is not very significant since she got up to CBGs of 500's when she was off her insulins, therefore, I would keep her on the same basal-bolus insulin regimen. She tells me that she is not having frequent urination, weight loss, or blurry vision, which can mean that her blood sugars are improved from 2 months ago. I therefore advised her to continue her current regimen of Lantus 45 units at bedtime and NovoLog 5-7 units with meals, and increase the insulin with meals slowly if her sugars before next meal are higher than 150. - Continue metformin 1000 mg twice a day - I advised her to return in 2 weeks with her sugar log to see if we need to adjust her insulin regimen - we also discussed about diet and exercise and I made several suggestions about how to improve both - given sugar log and advised how to fill it and to bring it at next appt - check 3-4 times daily - given foot care  handout and explained the principles - given instructions for hypoglycemia management "15-15 rule" - she is in the process of getting psychiatric help for her depression

## 2013-04-04 ENCOUNTER — Ambulatory Visit: Payer: BC Managed Care – PPO | Admitting: Internal Medicine

## 2013-05-09 ENCOUNTER — Inpatient Hospital Stay (HOSPITAL_COMMUNITY): Payer: BC Managed Care – PPO

## 2013-05-09 ENCOUNTER — Encounter (HOSPITAL_COMMUNITY): Payer: Self-pay | Admitting: *Deleted

## 2013-05-09 ENCOUNTER — Inpatient Hospital Stay (HOSPITAL_COMMUNITY)
Admission: AD | Admit: 2013-05-09 | Discharge: 2013-05-10 | Disposition: A | Discharge status: Home / Self Care | Payer: BC Managed Care – PPO | Source: Ambulatory Visit | Attending: Obstetrics & Gynecology | Admitting: Obstetrics & Gynecology

## 2013-05-09 DIAGNOSIS — R1032 Left lower quadrant pain: Secondary | ICD-10-CM | POA: Insufficient documentation

## 2013-05-09 DIAGNOSIS — D259 Leiomyoma of uterus, unspecified: Secondary | ICD-10-CM | POA: Insufficient documentation

## 2013-05-09 DIAGNOSIS — N950 Postmenopausal bleeding: Secondary | ICD-10-CM

## 2013-05-09 HISTORY — DX: Strain of unspecified achilles tendon, initial encounter: S86.019A

## 2013-05-09 LAB — CBC WITH DIFFERENTIAL/PLATELET
Eosinophils Absolute: 0.2 10*3/uL (ref 0.0–0.7)
Eosinophils Relative: 2 % (ref 0–5)
Lymphs Abs: 2.5 10*3/uL (ref 0.7–4.0)
MCH: 25.6 pg — ABNORMAL LOW (ref 26.0–34.0)
MCV: 80.2 fL (ref 78.0–100.0)
Monocytes Absolute: 0.5 10*3/uL (ref 0.1–1.0)
Platelets: 212 10*3/uL (ref 150–400)
RBC: 4.14 MIL/uL (ref 3.87–5.11)
RDW: 14.7 % (ref 11.5–15.5)

## 2013-05-09 LAB — WET PREP, GENITAL: Yeast Wet Prep HPF POC: NONE SEEN

## 2013-05-09 LAB — URINALYSIS, ROUTINE W REFLEX MICROSCOPIC
Bilirubin Urine: NEGATIVE
Glucose, UA: NEGATIVE mg/dL
Hgb urine dipstick: NEGATIVE
Ketones, ur: NEGATIVE mg/dL
Leukocytes, UA: NEGATIVE
Protein, ur: 30 mg/dL — AB
pH: 6 (ref 5.0–8.0)

## 2013-05-09 LAB — POCT PREGNANCY, URINE: Preg Test, Ur: NEGATIVE

## 2013-05-09 LAB — URINE MICROSCOPIC-ADD ON

## 2013-05-09 MED ORDER — KETOROLAC TROMETHAMINE 60 MG/2ML IM SOLN
60.0000 mg | Freq: Once | INTRAMUSCULAR | Status: AC
  Administered 2013-05-09: 60 mg via INTRAMUSCULAR
  Filled 2013-05-09: qty 2

## 2013-05-09 NOTE — MAU Provider Note (Signed)
History     CSN: [id number]  Arrival date and time: 05/09/13 1654   First Provider Initiated Contact with Patient 05/09/13 2144      Chief Complaint  Patient presents with  . Abdominal Pain   Abdominal Pain   [name] is a 57 y.o. G1P1 who presents today with pelvic and LLQ pain x 2 days. She also had some spotting last week. She states that in 2012 she had some PMB and was found to have a polyp. She states that this episode of bleeding is similar to that. She has not taken anything for the pain at this time.   Past Medical History  Diagnosis Date  . Diabetes mellitus 1999    T2DM  . H/O gestational diabetes mellitus, not currently pregnant   . Asthma   . Hyperlipidemia   . Hypertension   . Obesity   . OSA (obstructive sleep apnea)   . Glaucoma   . Achilles rupture     Past Surgical History  Procedure Laterality Date  . Cholecystectomy  1991  . Cesarean section  12/10/89  . Breast lumpectomy      Right breast    History reviewed. No pertinent family history.  History  Substance Use Topics  . Smoking status: Former Games developer  . Smokeless tobacco: Not on file  . Alcohol Use: Yes     Comment: occasional    Allergies:  Allergies  Allergen Reactions  . Eggs Or Egg-Derived Products Nausea And Vomiting  . Milk-Related Compounds Nausea And Vomiting  . Pork-Derived Products Nausea And Vomiting    Prescriptions prior to admission  Medication Sig Dispense Refill  . albuterol (PROVENTIL HFA;VENTOLIN HFA) 108 (90 BASE) MCG/ACT inhaler Inhale 2 puffs into the lungs every 6 (six) hours as needed. For shortness of breath  3 Inhaler  3  . atorvastatin (LIPITOR) 20 MG tablet Take 20 mg by mouth every evening.       . bimatoprost (LUMIGAN) 0.01 % SOLN Place 1 drop into both eyes at bedtime.  5 mL  10  . brimonidine (ALPHAGAN P) 0.1 % SOLN Place 1 drop into both eyes 2 (two) times daily.  10 mL  10  . brinzolamide (AZOPT) 1 % ophthalmic suspension Place 1 drop into both  eyes 2 (two) times daily.  10 mL  10  . FLUoxetine (PROZAC) 40 MG capsule Take 40 mg by mouth daily.      . Fluticasone-Salmeterol (ADVAIR) 250-50 MCG/DOSE AEPB Inhale 1 puff into the lungs 2 (two) times daily.  180 each  3  . insulin aspart (NOVOLOG) 100 UNIT/ML injection Inject 5-7 Units into the skin 3 (three) times daily with meals. Per sliding scale      . insulin glargine (LANTUS) 100 UNIT/ML injection Inject 45 Units into the skin at bedtime.      Marland Kitchen lisinopril-hydrochlorothiazide (PRINZIDE,ZESTORETIC) 20-12.5 MG per tablet Take 1 tablet by mouth daily.       . metFORMIN (GLUCOPHAGE) 1000 MG tablet Take 1,000 mg by mouth 2 (two) times daily with a meal.      . naproxen sodium (ANAPROX) 220 MG tablet Take 220-440 mg by mouth 2 (two) times daily with a meal.       . [DISCONTINUED] metFORMIN (GLUCOPHAGE) 1000 MG tablet Take 1 tablet (1,000 mg total) by mouth 2 (two) times daily with a meal.  60 tablet  0    Review of Systems  Gastrointestinal: Positive for abdominal pain.   Physical Exam   Blood  pressure 157/61, pulse 63, temperature 98 F (36.7 C), temperature source Oral, resp. rate 16, height 5' 1.25" (1.556 m), weight 108.92 kg (240 lb 2 oz), SpO2 100.00%.  Physical Exam  Nursing note and vitals reviewed. Constitutional: She is oriented to person, place, and time. She appears well-developed and well-nourished. No distress.  Cardiovascular: Normal rate.   Respiratory: Effort normal.  GI: Soft. There is no tenderness.  Genitourinary:   External: no lesion Vagina: small amount of white discharge Cervix: pink, smooth, no CMT Uterus: difficult to assess 2/2  Body habitus Adnexa: difficult to assess 2/2  Body habitus   Neurological: She is alert and oriented to person, place, and time.  Skin: Skin is warm and dry.  Psychiatric: She has a normal mood and affect.    MAU Course  Procedures  Results for orders placed during the hospital encounter of 05/09/13 (from the past 24  hour(s))  URINALYSIS, ROUTINE W REFLEX MICROSCOPIC     Status: Abnormal   Collection Time    05/09/13  6:03 PM      Result Value Range   Color, Urine YELLOW  YELLOW   APPearance CLEAR  CLEAR   Specific Gravity, Urine 1.025  1.005 - 1.030   pH 6.0  5.0 - 8.0   Glucose, UA NEGATIVE  NEGATIVE mg/dL   Hgb urine dipstick NEGATIVE  NEGATIVE   Bilirubin Urine NEGATIVE  NEGATIVE   Ketones, ur NEGATIVE  NEGATIVE mg/dL   Protein, ur 30 (*) NEGATIVE mg/dL   Urobilinogen, UA 1.0  0.0 - 1.0 mg/dL   Nitrite NEGATIVE  NEGATIVE   Leukocytes, UA NEGATIVE  NEGATIVE  URINE MICROSCOPIC-ADD ON     Status: Abnormal   Collection Time    05/09/13  6:03 PM      Result Value Range   Squamous Epithelial / LPF FEW (*) RARE   WBC, UA 0-2  <3 WBC/hpf   RBC / HPF 3-6  <3 RBC/hpf   Bacteria, UA MANY (*) RARE   Urine-Other MUCOUS PRESENT    POCT PREGNANCY, URINE     Status: None   Collection Time    05/09/13  6:52 PM      Result Value Range   Preg Test, Ur NEGATIVE  NEGATIVE  WET PREP, GENITAL     Status: Abnormal   Collection Time    05/09/13  9:50 PM      Result Value Range   Yeast Wet Prep HPF POC NONE SEEN  NONE SEEN   Trich, Wet Prep NONE SEEN  NONE SEEN   Clue Cells Wet Prep HPF POC FEW (*) NONE SEEN   WBC, Wet Prep HPF POC FEW (*) NONE SEEN  CBC WITH DIFFERENTIAL     Status: Abnormal   Collection Time    05/09/13 10:20 PM      Result Value Range   WBC 8.2  4.0 - 10.5 K/uL   RBC 4.14  3.87 - 5.11 MIL/uL   Hemoglobin 10.6 (*) 12.0 - 15.0 g/dL   HCT 40.9 (*) 81.1 - 91.4 %   MCV 80.2  78.0 - 100.0 fL   MCH 25.6 (*) 26.0 - 34.0 pg   MCHC 31.9  30.0 - 36.0 g/dL   RDW 78.2  95.6 - 21.3 %   Platelets 212  150 - 400 K/uL   Neutrophils Relative % 62  43 - 77 %   Neutro Abs 5.1  1.7 - 7.7 K/uL   Lymphocytes Relative 30  12 - 46 %  Lymphs Abs 2.5  0.7 - 4.0 K/uL   Monocytes Relative 6  3 - 12 %   Monocytes Absolute 0.5  0.1 - 1.0 K/uL   Eosinophils Relative 2  0 - 5 %   Eosinophils Absolute 0.2   0.0 - 0.7 K/uL   Basophils Relative 0  0 - 1 %   Basophils Absolute 0.0  0.0 - 0.1 K/uL    US Transvaginal Non-ob  05/09/2013   *RADIOLOGY REPORT*  Clinical Data: Post menopausal bleeding.  Pain.  History of fibroids.  TRANSABDOMINAL AND TRANSVAGINAL ULTRASOUND OF PELVIS Technique:  Both transabdominal and transvaginal ultrasound examinations of the pelvis were performed. Transabdominal technique was performed for global imaging of the pelvis including uterus, ovaries, adnexal regions, and pelvic cul-de-sac.  It was necessary to proceed with endovaginal exam following the transabdominal exam to visualize the uterus and ovaries.  Comparison:  05/13/2010  Findings:  Uterus: Heterogeneous appearance of the uterine myometrium with multiple mass lesions are demonstrated.  The largest is located posteriorly and measures about 9.5 x 9 x 8.3 cm.  Anterior fibroids are measured at 1.9 cm diameter and 3.2 cm diameter.  Overall, the uterus measures about 17.1 x 10.4 x 7 cm. Fibroids demonstrate enlargement since previous study.  Endometrium: Endometrium appears heterogeneous and thickened, measuring about 10 mm.  No endometrial fluid collections are demonstrated.  The appearance is similar to the previous study.  Right ovary:  Right ovary is not visualized.  No abnormal masses are demonstrated in right adnexal views.  Left ovary: Left ovary is not visualized.  No abnormal adnexal masses are demonstrated on adnexal views.  Other findings: No free fluid  IMPRESSION: Diffuse nodular enlargement of the uterus with multiple fibroids demonstrated.  Fibroids appear larger than on previous study. Heterogeneous endometrial stripe with borderline increased thickness at 10 mm. Ovaries are not visualized.   Original Report Authenticated By: Burman Nieves, M.D.   US Pelvis Complete  05/09/2013   *RADIOLOGY REPORT*  Clinical Data: Post menopausal bleeding.  Pain.  History of fibroids.  TRANSABDOMINAL AND TRANSVAGINAL ULTRASOUND  OF PELVIS Technique:  Both transabdominal and transvaginal ultrasound examinations of the pelvis were performed. Transabdominal technique was performed for global imaging of the pelvis including uterus, ovaries, adnexal regions, and pelvic cul-de-sac.  It was necessary to proceed with endovaginal exam following the transabdominal exam to visualize the uterus and ovaries.  Comparison:  05/13/2010  Findings:  Uterus: Heterogeneous appearance of the uterine myometrium with multiple mass lesions are demonstrated.  The largest is located posteriorly and measures about 9.5 x 9 x 8.3 cm.  Anterior fibroids are measured at 1.9 cm diameter and 3.2 cm diameter.  Overall, the uterus measures about 17.1 x 10.4 x 7 cm. Fibroids demonstrate enlargement since previous study.  Endometrium: Endometrium appears heterogeneous and thickened, measuring about 10 mm.  No endometrial fluid collections are demonstrated.  The appearance is similar to the previous study.  Right ovary:  Right ovary is not visualized.  No abnormal masses are demonstrated in right adnexal views.  Left ovary: Left ovary is not visualized.  No abnormal adnexal masses are demonstrated on adnexal views.  Other findings: No free fluid  IMPRESSION: Diffuse nodular enlargement of the uterus with multiple fibroids demonstrated.  Fibroids appear larger than on previous study. Heterogeneous endometrial stripe with borderline increased thickness at 10 mm. Ovaries are not visualized.   Original Report Authenticated By: Burman Nieves, M.D.    Assessment and Plan  Postmenopausal bleeding  Fibroids  FU with GYN clinic  Tawnya Crook 05/09/2013, 9:57 PM

## 2013-05-09 NOTE — MAU Note (Signed)
Pt woke up a few days ago with pain in left lower pelvic area only. Pain worsens with walking. Leg gets numb and tingly at times. Noted light bleeding last week. Pain has worsened progressively since last week, aleve ineffective. Hx polyps, had abnormal bleeding then as well.

## 2013-05-10 ENCOUNTER — Encounter (HOSPITAL_COMMUNITY): Payer: Self-pay | Admitting: Emergency Medicine

## 2013-05-10 ENCOUNTER — Emergency Department (HOSPITAL_COMMUNITY)
Admission: EM | Admit: 2013-05-10 | Discharge: 2013-05-10 | Disposition: A | Discharge status: Home / Self Care | Payer: BC Managed Care – PPO | Attending: Emergency Medicine | Admitting: Emergency Medicine

## 2013-05-10 ENCOUNTER — Emergency Department (HOSPITAL_COMMUNITY): Payer: BC Managed Care – PPO

## 2013-05-10 DIAGNOSIS — E669 Obesity, unspecified: Secondary | ICD-10-CM | POA: Insufficient documentation

## 2013-05-10 DIAGNOSIS — M5430 Sciatica, unspecified side: Secondary | ICD-10-CM | POA: Insufficient documentation

## 2013-05-10 DIAGNOSIS — Z79899 Other long term (current) drug therapy: Secondary | ICD-10-CM | POA: Insufficient documentation

## 2013-05-10 DIAGNOSIS — E785 Hyperlipidemia, unspecified: Secondary | ICD-10-CM | POA: Insufficient documentation

## 2013-05-10 DIAGNOSIS — Z8659 Personal history of other mental and behavioral disorders: Secondary | ICD-10-CM | POA: Insufficient documentation

## 2013-05-10 DIAGNOSIS — J45909 Unspecified asthma, uncomplicated: Secondary | ICD-10-CM | POA: Insufficient documentation

## 2013-05-10 DIAGNOSIS — R209 Unspecified disturbances of skin sensation: Secondary | ICD-10-CM | POA: Insufficient documentation

## 2013-05-10 DIAGNOSIS — Z87828 Personal history of other (healed) physical injury and trauma: Secondary | ICD-10-CM | POA: Insufficient documentation

## 2013-05-10 DIAGNOSIS — Z794 Long term (current) use of insulin: Secondary | ICD-10-CM | POA: Insufficient documentation

## 2013-05-10 DIAGNOSIS — Z87891 Personal history of nicotine dependence: Secondary | ICD-10-CM | POA: Insufficient documentation

## 2013-05-10 DIAGNOSIS — M549 Dorsalgia, unspecified: Secondary | ICD-10-CM | POA: Insufficient documentation

## 2013-05-10 DIAGNOSIS — Z8632 Personal history of gestational diabetes: Secondary | ICD-10-CM | POA: Insufficient documentation

## 2013-05-10 DIAGNOSIS — H409 Unspecified glaucoma: Secondary | ICD-10-CM | POA: Insufficient documentation

## 2013-05-10 DIAGNOSIS — G8929 Other chronic pain: Secondary | ICD-10-CM | POA: Insufficient documentation

## 2013-05-10 DIAGNOSIS — I10 Essential (primary) hypertension: Secondary | ICD-10-CM | POA: Insufficient documentation

## 2013-05-10 DIAGNOSIS — E119 Type 2 diabetes mellitus without complications: Secondary | ICD-10-CM | POA: Insufficient documentation

## 2013-05-10 DIAGNOSIS — M5432 Sciatica, left side: Secondary | ICD-10-CM

## 2013-05-10 LAB — GC/CHLAMYDIA PROBE AMP: CT Probe RNA: NEGATIVE

## 2013-05-10 MED ORDER — OXYCODONE-ACETAMINOPHEN 5-325 MG PO TABS
2.0000 | ORAL_TABLET | Freq: Once | ORAL | Status: AC
  Administered 2013-05-10: 2 via ORAL
  Filled 2013-05-10 (×2): qty 1

## 2013-05-10 MED ORDER — CYCLOBENZAPRINE HCL 10 MG PO TABS
5.0000 mg | ORAL_TABLET | Freq: Once | ORAL | Status: AC
  Administered 2013-05-10: 5 mg via ORAL
  Filled 2013-05-10: qty 1

## 2013-05-10 MED ORDER — KETOROLAC TROMETHAMINE 30 MG/ML IJ SOLN
30.0000 mg | Freq: Once | INTRAMUSCULAR | Status: DC
  Filled 2013-05-10: qty 1

## 2013-05-10 MED ORDER — ONDANSETRON HCL 4 MG PO TABS: 4.0000 mg | ORAL_TABLET | Freq: Four times a day (QID) | ORAL | Status: DC

## 2013-05-10 MED ORDER — OXYCODONE-ACETAMINOPHEN 5-325 MG PO TABS: 1.0000 | ORAL_TABLET | Freq: Four times a day (QID) | ORAL | Status: DC | PRN

## 2013-05-10 MED ORDER — ONDANSETRON 4 MG PO TBDP
4.0000 mg | ORAL_TABLET | Freq: Once | ORAL | Status: AC
  Administered 2013-05-10: 4 mg via ORAL
  Filled 2013-05-10: qty 1

## 2013-05-10 MED ORDER — KETOROLAC TROMETHAMINE 60 MG/2ML IM SOLN
60.0000 mg | Freq: Once | INTRAMUSCULAR | Status: AC
  Administered 2013-05-10: 60 mg via INTRAMUSCULAR
  Filled 2013-05-10: qty 2

## 2013-05-10 NOTE — ED Notes (Signed)
Pt arrives to Room 20 in wheelchair. Pt states she has had pain to her upper L thigh for about 2 days. States pain is shooting and now she has numbness and tingling in her L leg. Pain is worse with sitting and standing. Pain got much worse this morning.

## 2013-05-10 NOTE — ED Notes (Signed)
Pt c/o lt leg pain x 3 days.  Denies injury.

## 2013-05-10 NOTE — ED Provider Notes (Signed)
Medical screening examination/treatment/procedure(s) were performed by non-physician practitioner and as supervising physician I was immediately available for consultation/collaboration.   Junius Argyle, MD 05/10/13 [phone]

## 2013-05-10 NOTE — ED Notes (Signed)
PA Greene at bedside. 

## 2013-05-10 NOTE — ED Provider Notes (Signed)
CSN: [id number]     Arrival date & time 05/10/13  1641 History     First MD Initiated Contact with Patient 05/10/13 1654     Chief Complaint  Patient presents with  . Leg Pain   (Consider location/radiation/quality/duration/timing/severity/associated sxs/prior Treatment) HPI  Sayra Frisby is a 57 y.o.female with a significant PMH of diabetes, asthma, hyperlipidemia, obesity, chronic back pain hypertension, OSA, glaucoma, achilles rupture, ambulates with a cane presents to the ER with complaints of left hip pain without trauma. She says the pain has been hurting for 2 days and says it goes down towards her knee on the left side. The pain is not worse with movement. She is having some numbness, tingling and sharp shooting pains. Denies having fevers, chills, nausea, vomiting, constipation, diarrhea, dysuria or any other complaints at this time.    Past Medical History  Diagnosis Date  . Diabetes mellitus 1999    T2DM  . H/O gestational diabetes mellitus, not currently pregnant   . Asthma   . Hyperlipidemia   . Hypertension   . Obesity   . OSA (obstructive sleep apnea)   . Glaucoma   . Achilles rupture    Past Surgical History  Procedure Laterality Date  . Cholecystectomy  1991  . Cesarean section  12/10/89  . Breast lumpectomy      Right breast   No family history on file. History  Substance Use Topics  . Smoking status: Former Games developer  . Smokeless tobacco: Not on file  . Alcohol Use: Yes     Comment: occasional   OB History   Grav Para Term Preterm Abortions TAB SAB Ect Mult Living   1 1        1      Review of Systems ROS is negative unless otherwise stated in the HPI  Allergies  Eggs or egg-derived products; Milk-related compounds; and Pork-derived products  Home Medications   Current Outpatient Rx  Name  Route  Sig  Dispense  Refill  . albuterol (PROVENTIL HFA;VENTOLIN HFA) 108 (90 BASE) MCG/ACT inhaler   Inhalation   Inhale 2 puffs into the lungs every 6  (six) hours as needed. For shortness of breath   3 Inhaler   3   . atorvastatin (LIPITOR) 20 MG tablet   Oral   Take 20 mg by mouth every evening.          . bimatoprost (LUMIGAN) 0.01 % SOLN   Both Eyes   Place 1 drop into both eyes at bedtime.   5 mL   10   . brimonidine (ALPHAGAN P) 0.1 % SOLN   Both Eyes   Place 1 drop into both eyes 2 (two) times daily.   10 mL   10   . brinzolamide (AZOPT) 1 % ophthalmic suspension   Both Eyes   Place 1 drop into both eyes 2 (two) times daily.   10 mL   10   . FLUoxetine (PROZAC) 40 MG capsule   Oral   Take 40 mg by mouth at bedtime.          . Fluticasone-Salmeterol (ADVAIR) 250-50 MCG/DOSE AEPB   Inhalation   Inhale 1 puff into the lungs 2 (two) times daily.   180 each   3   . ibuprofen (ADVIL,MOTRIN) 200 MG tablet   Oral   Take 200 mg by mouth every 6 (six) hours as needed for pain.         Marland Kitchen insulin aspart (NOVOLOG) 100 UNIT/ML  injection   Subcutaneous   Inject 5-7 Units into the skin 3 (three) times daily with meals. Per sliding scale         . insulin glargine (LANTUS) 100 UNIT/ML injection   Subcutaneous   Inject 45 Units into the skin at bedtime.         Marland Kitchen lisinopril-hydrochlorothiazide (PRINZIDE,ZESTORETIC) 20-12.5 MG per tablet   Oral   Take 1 tablet by mouth daily.          . metFORMIN (GLUCOPHAGE) 1000 MG tablet   Oral   Take 1,000 mg by mouth 2 (two) times daily with a meal.         . naproxen sodium (ANAPROX) 220 MG tablet   Oral   Take 220-440 mg by mouth 2 (two) times daily with a meal.           BP 182/60  Pulse 66  Temp(Src) 98.1 F (36.7 C)  Resp 15  SpO2 100% Physical Exam  Nursing note and vitals reviewed. Constitutional: She appears well-developed and well-nourished. No distress.  HENT:  Head: Normocephalic and atraumatic.   HEENT: Anicteric.  No pallor.  No discharge from ears, eyes, nose, or mouth.   Eyes: Pupils are equal, round, and reactive to light.  Neck:  Normal range of motion. Neck supple.  Cardiovascular: Normal rate and regular rhythm.   Pulmonary/Chest: Effort normal.  Abdominal: Soft.  Musculoskeletal:       Lumbar back: She exhibits pain. She exhibits normal range of motion, no tenderness, no bony tenderness, no swelling, no edema, no deformity, no laceration, no spasm and normal pulse.       Back:   Equal strength to bilateral lower extremities. Neurosensory function adequate to both legs. Skin color is normal. Skin is warm and moist. I see no step off deformity, no bony tenderness. Pt is able to ambulate without limp. Pain is relieved when sitting in certain positions. ROM is decreased due to pain. No crepitus, laceration, effusion, swelling.  Pulses are normal   Neurological: She is alert.  Skin: Skin is warm and dry.    ED Course   Procedures (including critical care time)  Labs Reviewed - No data to display Dg Lumbar Spine Complete  05/10/2013   *RADIOLOGY REPORT*  Clinical Data: Acutely worsening low back pain, pain down left leg to knee, numbness and tingling down left leg to toes  LUMBAR SPINE - COMPLETE 4+ VIEW  Comparison: None  Findings: Osseous mineralization normal. Five non-rib bearing lumbar vertebrae. Disc space narrowing with endplate spur formation at L4-L5. Vertebral body heights maintained without fracture or subluxation. No bone destruction or spondylolysis. SI joints symmetric.  IMPRESSION: Degenerative disc disease changes at L4-L5. No acute osseous findings.   Original Report Authenticated By: Ulyses Southward, M.D.   US Transvaginal Non-ob  05/09/2013   *RADIOLOGY REPORT*  Clinical Data: Post menopausal bleeding.  Pain.  History of fibroids.  TRANSABDOMINAL AND TRANSVAGINAL ULTRASOUND OF PELVIS Technique:  Both transabdominal and transvaginal ultrasound examinations of the pelvis were performed. Transabdominal technique was performed for global imaging of the pelvis including uterus, ovaries, adnexal regions, and  pelvic cul-de-sac.  It was necessary to proceed with endovaginal exam following the transabdominal exam to visualize the uterus and ovaries.  Comparison:  05/13/2010  Findings:  Uterus: Heterogeneous appearance of the uterine myometrium with multiple mass lesions are demonstrated.  The largest is located posteriorly and measures about 9.5 x 9 x 8.3 cm.  Anterior fibroids are measured at 1.9  cm diameter and 3.2 cm diameter.  Overall, the uterus measures about 17.1 x 10.4 x 7 cm. Fibroids demonstrate enlargement since previous study.  Endometrium: Endometrium appears heterogeneous and thickened, measuring about 10 mm.  No endometrial fluid collections are demonstrated.  The appearance is similar to the previous study.  Right ovary:  Right ovary is not visualized.  No abnormal masses are demonstrated in right adnexal views.  Left ovary: Left ovary is not visualized.  No abnormal adnexal masses are demonstrated on adnexal views.  Other findings: No free fluid  IMPRESSION: Diffuse nodular enlargement of the uterus with multiple fibroids demonstrated.  Fibroids appear larger than on previous study. Heterogeneous endometrial stripe with borderline increased thickness at 10 mm. Ovaries are not visualized.   Original Report Authenticated By: Burman Nieves, M.D.   US Pelvis Complete  05/09/2013   *RADIOLOGY REPORT*  Clinical Data: Post menopausal bleeding.  Pain.  History of fibroids.  TRANSABDOMINAL AND TRANSVAGINAL ULTRASOUND OF PELVIS Technique:  Both transabdominal and transvaginal ultrasound examinations of the pelvis were performed. Transabdominal technique was performed for global imaging of the pelvis including uterus, ovaries, adnexal regions, and pelvic cul-de-sac.  It was necessary to proceed with endovaginal exam following the transabdominal exam to visualize the uterus and ovaries.  Comparison:  05/13/2010  Findings:  Uterus: Heterogeneous appearance of the uterine myometrium with multiple mass lesions are  demonstrated.  The largest is located posteriorly and measures about 9.5 x 9 x 8.3 cm.  Anterior fibroids are measured at 1.9 cm diameter and 3.2 cm diameter.  Overall, the uterus measures about 17.1 x 10.4 x 7 cm. Fibroids demonstrate enlargement since previous study.  Endometrium: Endometrium appears heterogeneous and thickened, measuring about 10 mm.  No endometrial fluid collections are demonstrated.  The appearance is similar to the previous study.  Right ovary:  Right ovary is not visualized.  No abnormal masses are demonstrated in right adnexal views.  Left ovary: Left ovary is not visualized.  No abnormal adnexal masses are demonstrated on adnexal views.  Other findings: No free fluid  IMPRESSION: Diffuse nodular enlargement of the uterus with multiple fibroids demonstrated.  Fibroids appear larger than on previous study. Heterogeneous endometrial stripe with borderline increased thickness at 10 mm. Ovaries are not visualized.   Original Report Authenticated By: Burman Nieves, M.D.   1. Sciatica neuralgia, left   2. Hypertension     MDM  Pt given 2 Percocets PO and a 60mg  IM shot of Toradol. Her pain has relieved to 2/10 and she feels comfortable going home. She has a PCP she can follow-up with.  After pain managed her blood pressure on repeat is still elevated but she is seeing her new doctor in 1 week who I assumed will manage her BP. No BP related symptoms at this time noted. No current BP medications on med list.  57 y.o.Melvinia Boyte's evaluation in the Emergency Department is complete. It has been determined that no acute conditions requiring further emergency intervention are present at this time. The patient/guardian have been advised of the diagnosis and plan. We have discussed signs and symptoms that warrant return to the ED, such as changes or worsening in symptoms.  Vital signs are stable at discharge. Filed Vitals:   05/10/13 1825  BP: 182/60  Pulse: 66  Temp:   Resp: 15     Patient/guardian has voiced understanding and agreed to follow-up with the PCP or specialist.   Dorthula Matas, PA-C 05/10/13 1829

## 2013-05-10 NOTE — ED Notes (Signed)
Pt states she has a ride home. 

## 2013-05-14 ENCOUNTER — Ambulatory Visit: Payer: BC Managed Care – PPO | Admitting: Family Medicine

## 2013-05-14 VITALS — BP 142/98 | HR 82 | Temp 98.4°F | Resp 20 | Ht 66.0 in | Wt 240.0 lb

## 2013-05-14 DIAGNOSIS — M25552 Pain in left hip: Secondary | ICD-10-CM

## 2013-05-14 DIAGNOSIS — D259 Leiomyoma of uterus, unspecified: Secondary | ICD-10-CM

## 2013-05-14 DIAGNOSIS — D219 Benign neoplasm of connective and other soft tissue, unspecified: Secondary | ICD-10-CM

## 2013-05-14 DIAGNOSIS — M25559 Pain in unspecified hip: Secondary | ICD-10-CM

## 2013-05-14 DIAGNOSIS — E119 Type 2 diabetes mellitus without complications: Secondary | ICD-10-CM

## 2013-05-14 LAB — POCT UA - MICROSCOPIC ONLY
Casts, Ur, LPF, POC: NEGATIVE
Crystals, Ur, HPF, POC: NEGATIVE
Mucus, UA: NEGATIVE
Yeast, UA: NEGATIVE

## 2013-05-14 LAB — POCT URINALYSIS DIPSTICK
Bilirubin, UA: NEGATIVE
Glucose, UA: 100
Ketones, UA: NEGATIVE
Leukocytes, UA: NEGATIVE
Nitrite, UA: NEGATIVE
Protein, UA: >300
Spec Grav, UA: 1.02
Urobilinogen, UA: 4
pH, UA: 7

## 2013-05-14 LAB — GLUCOSE, POCT (MANUAL RESULT ENTRY): POC Glucose: 297 mg/dl — AB (ref 70–99)

## 2013-05-14 MED ORDER — OXYCODONE-ACETAMINOPHEN 5-325 MG PO TABS: 1.0000 | ORAL_TABLET | Freq: Four times a day (QID) | ORAL | Status: DC | PRN

## 2013-05-14 MED ORDER — PREDNISONE 20 MG PO TABS: ORAL_TABLET | ORAL | Status: DC

## 2013-05-14 NOTE — Progress Notes (Signed)
Subjective:    Patient ID: [name], female    DOB: [birth year], 57 y.o.   MRN: [id number]  HPI  57 y.o. Female presents to clinic today with left leg pain that radiates from the lower back. Was [facility] and  for this.  She was given percocet.  Pain is horrible  Review of Systems No fever, trauma    Objective:   Physical Exam HEENT:  Unremarkable Chest:  Clear Abdomen:  Soft, nontender, obese Back: nontender SLR:  Weakly positive on left Extremities:  No edema, strong pulses Skin:  No rash Results for orders placed during the hospital encounter of 05/09/13  WET PREP, GENITAL      Result Value Range   Yeast Wet Prep HPF POC NONE SEEN  NONE SEEN   Trich, Wet Prep NONE SEEN  NONE SEEN   Clue Cells Wet Prep HPF POC FEW (*) NONE SEEN   WBC, Wet Prep HPF POC FEW (*) NONE SEEN  GC/CHLAMYDIA PROBE AMP      Result Value Range   CT Probe RNA NEGATIVE  NEGATIVE   GC Probe RNA NEGATIVE  NEGATIVE  URINALYSIS, ROUTINE W REFLEX MICROSCOPIC      Result Value Range   Color, Urine YELLOW  YELLOW   APPearance CLEAR  CLEAR   Specific Gravity, Urine 1.025  1.005 - 1.030   pH 6.0  5.0 - 8.0   Glucose, UA NEGATIVE  NEGATIVE mg/dL   Hgb urine dipstick NEGATIVE  NEGATIVE   Bilirubin Urine NEGATIVE  NEGATIVE   Ketones, ur NEGATIVE  NEGATIVE mg/dL   Protein, ur 30 (*) NEGATIVE mg/dL   Urobilinogen, UA 1.0  0.0 - 1.0 mg/dL   Nitrite NEGATIVE  NEGATIVE   Leukocytes, UA NEGATIVE  NEGATIVE  URINE MICROSCOPIC-ADD ON      Result Value Range   Squamous Epithelial / LPF FEW (*) RARE   WBC, UA 0-2  <3 WBC/hpf   RBC / HPF 3-6  <3 RBC/hpf   Bacteria, UA MANY (*) RARE   Urine-Other MUCOUS PRESENT    CBC WITH DIFFERENTIAL      Result Value Range   WBC 8.2  4.0 - 10.5 K/uL   RBC 4.14  3.87 - 5.11 MIL/uL   Hemoglobin 10.6 (*) 12.0 - 15.0 g/dL   HCT 78.2 (*) 95.6 - 21.3 %   MCV 80.2  78.0 - 100.0 fL   MCH 25.6 (*) 26.0 - 34.0 pg   MCHC 31.9  30.0 - 36.0 g/dL   RDW  08.6  57.8 - 46.9 %   Platelets 212  150 - 400 K/uL   Neutrophils Relative % 62  43 - 77 %   Neutro Abs 5.1  1.7 - 7.7 K/uL   Lymphocytes Relative 30  12 - 46 %   Lymphs Abs 2.5  0.7 - 4.0 K/uL   Monocytes Relative 6  3 - 12 %   Monocytes Absolute 0.5  0.1 - 1.0 K/uL   Eosinophils Relative 2  0 - 5 %   Eosinophils Absolute 0.2  0.0 - 0.7 K/uL   Basophils Relative 0  0 - 1 %   Basophils Absolute 0.0  0.0 - 0.1 K/uL  POCT PREGNANCY, URINE      Result Value Range   Preg Test, Ur NEGATIVE  NEGATIVE   Results for orders placed in visit on 05/14/13  POCT URINALYSIS DIPSTICK      Result Value Range   Color, UA yellow  Clarity, UA clear     Glucose, UA 100     Bilirubin, UA neg     Ketones, UA neg     Spec Grav, UA 1.020     Blood, UA trace-lysed     pH, UA 7.0     Protein, UA >300     Urobilinogen, UA 4.0     Nitrite, UA neg     Leukocytes, UA Negative    POCT UA - MICROSCOPIC ONLY      Result Value Range   WBC, Ur, HPF, POC 0-1     RBC, urine, microscopic 0-3     Bacteria, U Microscopic trace     Mucus, UA neg     Epithelial cells, urine per micros 0-4     Crystals, Ur, HPF, POC neg     Casts, Ur, LPF, POC neg     Yeast, UA neg    GLUCOSE, POCT (MANUAL RESULT ENTRY)      Result Value Range   POC Glucose 297 (*) 70 - 99 mg/dl        Assessment & Plan:  Sciatic pain complicated by fibroids and hyperglycemia  Refer to gynecology. Prednisone Left hip pain - Plan: POCT urinalysis dipstick, POCT UA - Microscopic Only, oxyCODONE-acetaminophen (PERCOCET/ROXICET) 5-325 MG per tablet, predniSONE (DELTASONE) 20 MG tablet  Diabetes - Plan: POCT glucose (manual entry)  Fibroids - Plan: Ambulatory referral to Gynecology Patient is to use the Novolog sliding scale and keep up with sugar while on the prednisone.  Signed, Elvina Sidle, MD

## 2013-05-20 ENCOUNTER — Telehealth: Payer: Self-pay

## 2013-05-20 NOTE — Telephone Encounter (Signed)
Pt would like to talk with dr Milus Glazier about a different referral that Dr. Juliene Pina did not find anything  Best number [phone]

## 2013-05-21 NOTE — Telephone Encounter (Signed)
Please advise. Patient wants to see another GYN

## 2013-05-21 NOTE — Telephone Encounter (Signed)
Please return for recheck 

## 2013-05-22 NOTE — Telephone Encounter (Signed)
Patient advised.

## 2013-05-23 ENCOUNTER — Telehealth: Payer: Self-pay

## 2013-05-23 NOTE — Telephone Encounter (Signed)
Patient states she is in severe pain due to her Sciatica. She would like Dr Milus Glazier to prescribe her a muscle relaxer and she would like it sent to [facility] located on [address]. Thanks

## 2013-05-24 ENCOUNTER — Telehealth: Payer: Self-pay

## 2013-05-24 ENCOUNTER — Other Ambulatory Visit: Payer: Self-pay | Admitting: Family Medicine

## 2013-05-24 DIAGNOSIS — M5430 Sciatica, unspecified side: Secondary | ICD-10-CM

## 2013-05-24 MED ORDER — CYCLOBENZAPRINE HCL 5 MG PO TABS: 5.0000 mg | ORAL_TABLET | Freq: Three times a day (TID) | ORAL | Status: DC | PRN

## 2013-05-24 NOTE — Telephone Encounter (Signed)
Pt called back and I advised her that Dr L will need to see her for re-eval before he could rx a pain medication, but that he did send in a muscle relaxer for her. Pt thanked [place] and I gave her his hours next week.

## 2013-05-24 NOTE — Telephone Encounter (Signed)
Rx sent in for flexeril and Pt notified.

## 2013-05-24 NOTE — Telephone Encounter (Signed)
Pt is in a lot of pain.  Would like another pain medication prescribed.  Pt of Dr.L. Call at [phone].

## 2013-05-24 NOTE — Telephone Encounter (Signed)
Needs to be seen

## 2013-05-27 ENCOUNTER — Other Ambulatory Visit: Payer: Self-pay | Admitting: Obstetrics & Gynecology

## 2013-05-27 DIAGNOSIS — D219 Benign neoplasm of connective and other soft tissue, unspecified: Secondary | ICD-10-CM

## 2013-05-27 DIAGNOSIS — M25552 Pain in left hip: Secondary | ICD-10-CM

## 2013-06-04 ENCOUNTER — Ambulatory Visit
Admission: RE | Admit: 2013-06-04 | Discharge: 2013-06-04 | Disposition: A | Discharge status: Home / Self Care | Payer: BC Managed Care – PPO | Source: Ambulatory Visit | Attending: Obstetrics & Gynecology | Admitting: Obstetrics & Gynecology

## 2013-06-04 DIAGNOSIS — D219 Benign neoplasm of connective and other soft tissue, unspecified: Secondary | ICD-10-CM

## 2013-06-04 DIAGNOSIS — M25552 Pain in left hip: Secondary | ICD-10-CM

## 2013-06-04 MED ORDER — GADOBENATE DIMEGLUMINE 529 MG/ML IV SOLN
20.0000 mL | Freq: Once | INTRAVENOUS | Status: AC | PRN
  Administered 2013-06-04: 20 mL via INTRAVENOUS

## 2013-06-09 ENCOUNTER — Encounter: Payer: BC Managed Care – PPO | Admitting: Obstetrics & Gynecology

## 2013-06-26 ENCOUNTER — Ambulatory Visit: Payer: BC Managed Care – PPO | Admitting: Family Medicine

## 2013-08-06 ENCOUNTER — Emergency Department (HOSPITAL_COMMUNITY): Payer: BC Managed Care – PPO

## 2013-08-06 ENCOUNTER — Emergency Department (HOSPITAL_COMMUNITY)
Admission: EM | Admit: 2013-08-06 | Discharge: 2013-08-06 | Disposition: A | Discharge status: Home / Self Care | Payer: BC Managed Care – PPO | Attending: Emergency Medicine | Admitting: Emergency Medicine

## 2013-08-06 ENCOUNTER — Encounter (HOSPITAL_COMMUNITY): Payer: Self-pay | Admitting: Emergency Medicine

## 2013-08-06 DIAGNOSIS — R011 Cardiac murmur, unspecified: Secondary | ICD-10-CM | POA: Insufficient documentation

## 2013-08-06 DIAGNOSIS — IMO0002 Reserved for concepts with insufficient information to code with codable children: Secondary | ICD-10-CM | POA: Insufficient documentation

## 2013-08-06 DIAGNOSIS — M129 Arthropathy, unspecified: Secondary | ICD-10-CM | POA: Insufficient documentation

## 2013-08-06 DIAGNOSIS — Z87891 Personal history of nicotine dependence: Secondary | ICD-10-CM | POA: Insufficient documentation

## 2013-08-06 DIAGNOSIS — S29012A Strain of muscle and tendon of back wall of thorax, initial encounter: Secondary | ICD-10-CM

## 2013-08-06 DIAGNOSIS — S46909A Unspecified injury of unspecified muscle, fascia and tendon at shoulder and upper arm level, unspecified arm, initial encounter: Secondary | ICD-10-CM | POA: Insufficient documentation

## 2013-08-06 DIAGNOSIS — Z791 Long term (current) use of non-steroidal anti-inflammatories (NSAID): Secondary | ICD-10-CM | POA: Insufficient documentation

## 2013-08-06 DIAGNOSIS — E785 Hyperlipidemia, unspecified: Secondary | ICD-10-CM | POA: Insufficient documentation

## 2013-08-06 DIAGNOSIS — E119 Type 2 diabetes mellitus without complications: Secondary | ICD-10-CM | POA: Insufficient documentation

## 2013-08-06 DIAGNOSIS — S0990XA Unspecified injury of head, initial encounter: Secondary | ICD-10-CM | POA: Insufficient documentation

## 2013-08-06 DIAGNOSIS — W19XXXA Unspecified fall, initial encounter: Secondary | ICD-10-CM

## 2013-08-06 DIAGNOSIS — S239XXA Sprain of unspecified parts of thorax, initial encounter: Secondary | ICD-10-CM | POA: Insufficient documentation

## 2013-08-06 DIAGNOSIS — I10 Essential (primary) hypertension: Secondary | ICD-10-CM | POA: Insufficient documentation

## 2013-08-06 DIAGNOSIS — H409 Unspecified glaucoma: Secondary | ICD-10-CM | POA: Insufficient documentation

## 2013-08-06 DIAGNOSIS — S233XXA Sprain of ligaments of thoracic spine, initial encounter: Secondary | ICD-10-CM

## 2013-08-06 DIAGNOSIS — F411 Generalized anxiety disorder: Secondary | ICD-10-CM | POA: Insufficient documentation

## 2013-08-06 DIAGNOSIS — W1809XA Striking against other object with subsequent fall, initial encounter: Secondary | ICD-10-CM | POA: Insufficient documentation

## 2013-08-06 DIAGNOSIS — Z794 Long term (current) use of insulin: Secondary | ICD-10-CM | POA: Insufficient documentation

## 2013-08-06 DIAGNOSIS — Z79899 Other long term (current) drug therapy: Secondary | ICD-10-CM | POA: Insufficient documentation

## 2013-08-06 DIAGNOSIS — F3289 Other specified depressive episodes: Secondary | ICD-10-CM | POA: Insufficient documentation

## 2013-08-06 DIAGNOSIS — E669 Obesity, unspecified: Secondary | ICD-10-CM | POA: Insufficient documentation

## 2013-08-06 DIAGNOSIS — S4980XA Other specified injuries of shoulder and upper arm, unspecified arm, initial encounter: Secondary | ICD-10-CM | POA: Insufficient documentation

## 2013-08-06 DIAGNOSIS — Y9301 Activity, walking, marching and hiking: Secondary | ICD-10-CM | POA: Insufficient documentation

## 2013-08-06 DIAGNOSIS — Y92009 Unspecified place in unspecified non-institutional (private) residence as the place of occurrence of the external cause: Secondary | ICD-10-CM | POA: Insufficient documentation

## 2013-08-06 DIAGNOSIS — F329 Major depressive disorder, single episode, unspecified: Secondary | ICD-10-CM | POA: Insufficient documentation

## 2013-08-06 DIAGNOSIS — J45909 Unspecified asthma, uncomplicated: Secondary | ICD-10-CM | POA: Insufficient documentation

## 2013-08-06 MED ORDER — IBUPROFEN 800 MG PO TABS
800.0000 mg | ORAL_TABLET | Freq: Once | ORAL | Status: AC
  Administered 2013-08-06: 800 mg via ORAL
  Filled 2013-08-06: qty 1

## 2013-08-06 MED ORDER — CYCLOBENZAPRINE HCL 10 MG PO TABS: 10.0000 mg | ORAL_TABLET | Freq: Two times a day (BID) | ORAL | Status: DC | PRN

## 2013-08-06 MED ORDER — IBUPROFEN 800 MG PO TABS: 800.0000 mg | ORAL_TABLET | Freq: Three times a day (TID) | ORAL | Status: DC

## 2013-08-06 MED ORDER — HYDROCODONE-ACETAMINOPHEN 5-325 MG PO TABS: 2.0000 | ORAL_TABLET | ORAL | Status: DC | PRN

## 2013-08-06 MED ORDER — CYCLOBENZAPRINE HCL 10 MG PO TABS
5.0000 mg | ORAL_TABLET | Freq: Once | ORAL | Status: AC
  Administered 2013-08-06: 5 mg via ORAL
  Filled 2013-08-06: qty 1

## 2013-08-06 NOTE — ED Notes (Signed)
The pt tripped and fell on Monday.  She  Walks with a cane and fell.  C/o head pain lower back pain rt upper arm pain since the fall.  No loc

## 2013-08-06 NOTE — ED Notes (Signed)
Headache since the fall also

## 2013-08-06 NOTE — ED Provider Notes (Signed)
CSN: [id number]     Arrival date & time 08/06/13  1925 History   First MD Initiated Contact with Patient 08/06/13 2014     Chief Complaint  Patient presents with  . Fall   (Consider location/radiation/quality/duration/timing/severity/associated sxs/prior Treatment) HPI Comments: Patient had a fall 2 nights ago while walking in her house. She walks with a cane secondary to Achilles tendon rupture on her right side. He fell and struck her head on the wall. Uncertain if she loss consciousness. She complains of headache since then with pain in her right upper back and right arm. Denies any nausea or vomiting. Denies any dizziness lightheadedness. Denies any chest pain or shortness of breath. No abdominal pain. No focal weakness, numbness or tingling.  The history is provided by the patient.    Past Medical History  Diagnosis Date  . Diabetes mellitus 1999    T2DM  . H/O gestational diabetes mellitus, not currently pregnant   . Asthma   . Hyperlipidemia   . Hypertension   . Obesity   . OSA (obstructive sleep apnea)   . Glaucoma   . Achilles rupture   . Depression   . Arthritis   . Anxiety   . Heart murmur    Past Surgical History  Procedure Laterality Date  . Cholecystectomy  1991  . Cesarean section  12/10/89  . Breast lumpectomy      Right breast  . Tubal ligation    . Back surgery     No family history on file. History  Substance Use Topics  . Smoking status: Former Games developer  . Smokeless tobacco: Not on file  . Alcohol Use: Yes     Comment: occasional   OB History   Grav Para Term Preterm Abortions TAB SAB Ect Mult Living   1 1        1      Review of Systems  Constitutional: Negative for fever, activity change and appetite change.  Respiratory: Negative for cough, chest tightness and shortness of breath.   Cardiovascular: Negative for chest pain.  Gastrointestinal: Negative for nausea, vomiting, abdominal pain and rectal pain.  Genitourinary: Negative for dysuria.   Musculoskeletal: Positive for arthralgias and myalgias. Negative for neck pain.  Skin: Negative for pallor.  Neurological: Positive for headaches. Negative for dizziness and light-headedness.  A complete 10 system review of systems was obtained and all systems are negative except as noted in the HPI and PMH.    Allergies  Eggs or egg-derived products; Milk-related compounds; and Pork-derived products  Home Medications   Current Outpatient Rx  Name  Route  Sig  Dispense  Refill  . albuterol (PROVENTIL HFA;VENTOLIN HFA) 108 (90 BASE) MCG/ACT inhaler   Inhalation   Inhale 2 puffs into the lungs every 6 (six) hours as needed. For shortness of breath   3 Inhaler   3   . atorvastatin (LIPITOR) 20 MG tablet   Oral   Take 20 mg by mouth every evening.          . bimatoprost (LUMIGAN) 0.01 % SOLN   Both Eyes   Place 1 drop into both eyes at bedtime.   5 mL   10   . brimonidine (ALPHAGAN P) 0.1 % SOLN   Both Eyes   Place 1 drop into both eyes 2 (two) times daily.   10 mL   10   . brinzolamide (AZOPT) 1 % ophthalmic suspension   Both Eyes   Place 1 drop into both eyes  2 (two) times daily.   10 mL   10   . cyclobenzaprine (FLEXERIL) 5 MG tablet   Oral   Take 1 tablet (5 mg total) by mouth 3 (three) times daily as needed for muscle spasms.   30 tablet   1   . FLUoxetine (PROZAC) 40 MG capsule   Oral   Take 40 mg by mouth at bedtime.          . Fluticasone-Salmeterol (ADVAIR) 250-50 MCG/DOSE AEPB   Inhalation   Inhale 1 puff into the lungs 2 (two) times daily.   180 each   3   . ibuprofen (ADVIL,MOTRIN) 200 MG tablet   Oral   Take 200 mg by mouth every 6 (six) hours as needed for pain.         Marland Kitchen insulin aspart (NOVOLOG) 100 UNIT/ML injection   Subcutaneous   Inject 5-7 Units into the skin 3 (three) times daily with meals. Per sliding scale         . insulin glargine (LANTUS) 100 UNIT/ML injection   Subcutaneous   Inject 45 Units into the skin at  bedtime.         Marland Kitchen lisinopril-hydrochlorothiazide (PRINZIDE,ZESTORETIC) 20-12.5 MG per tablet   Oral   Take 1 tablet by mouth daily.          . metFORMIN (GLUCOPHAGE) 1000 MG tablet   Oral   Take 1,000 mg by mouth 2 (two) times daily with a meal.         . naproxen sodium (ANAPROX) 220 MG tablet   Oral   Take 220-440 mg by mouth 2 (two) times daily with a meal.          . cyclobenzaprine (FLEXERIL) 10 MG tablet   Oral   Take 1 tablet (10 mg total) by mouth 2 (two) times daily as needed for muscle spasms.   20 tablet   0   . HYDROcodone-acetaminophen (NORCO/VICODIN) 5-325 MG per tablet   Oral   Take 2 tablets by mouth every 4 (four) hours as needed.   10 tablet   0   . ibuprofen (ADVIL,MOTRIN) 800 MG tablet   Oral   Take 1 tablet (800 mg total) by mouth 3 (three) times daily.   21 tablet   0    BP 173/69  Pulse 62  Temp(Src) 98 F (36.7 C) (Oral)  Resp 18  SpO2 98% Physical Exam  Constitutional: She is oriented to person, place, and time. She appears well-developed and well-nourished. No distress.  HENT:  Head: Normocephalic and atraumatic.  Mouth/Throat: Oropharynx is clear and moist. No oropharyngeal exudate.  Eyes: Conjunctivae and EOM are normal. Pupils are equal, round, and reactive to light.  Neck: Normal range of motion. Neck supple.  No midline C-spine pain  Cardiovascular: Normal rate, regular rhythm and normal heart sounds.   No murmur heard. Pulmonary/Chest: Effort normal and breath sounds normal. No respiratory distress.  Abdominal: Soft.  Musculoskeletal: She exhibits tenderness. She exhibits no edema.  Right paraspinal thoracic tenderness. No midline C-spine pain.  Neurological: She is alert and oriented to person, place, and time. No cranial nerve deficit. She exhibits normal muscle tone. Coordination normal.  CN 2-12 intact, no ataxia on finger to nose, no nystagmus, 5/5 strength throughout, no pronator drift, Romberg negative, normal  gait.  Chronic weakness in R ankle flexion and extension from achille injury in 2010   Skin: Skin is warm.    ED Course  Procedures (including critical care  time) Labs Review Labs Reviewed - No data to display Imaging Review Dg Cervical Spine Complete  08/06/2013   CLINICAL DATA:  Pain post trauma  EXAM: CERVICAL SPINE  4+ VIEWS  COMPARISON:  None.  FINDINGS: Frontal, lateral, open-mouth odontoid, and bilateral oblique views were obtained. There is postoperative change anteriorly at the C5 and C6. The screw and plate fixation device appears intact. There is marked disc space narrowing at C5-6.  There is no fracture or spondylolisthesis. Prevertebral soft tissues and predental space regions are within normal limits. There is exit foraminal narrowing the oblique views at C5-6 bilaterally.  IMPRESSION: Osteoarthritic change and postoperative changes C5-6. No apparent fracture or spondylolisthesis.   Electronically Signed   By: Bretta Bang M.D.   On: 08/06/2013 21:53   Dg Shoulder Right  08/06/2013   CLINICAL DATA:  Pain post trauma  EXAM: RIGHT SHOULDER - 2+ VIEW  COMPARISON:  None.  FINDINGS: Frontal scapular images were obtained. No fracture or dislocation. Joint spaces appear intact. No erosive change.  IMPRESSION: No abnormality noted.   Electronically Signed   By: Bretta Bang M.D.   On: 08/06/2013 21:51   Ct Head Wo Contrast  08/06/2013   CLINICAL DATA:  Pain post trauma  EXAM: CT HEAD WITHOUT CONTRAST  TECHNIQUE: Contiguous axial images were obtained from the base of the skull through the vertex without intravenous contrast.  COMPARISON:  None.  FINDINGS: The ventricles are normal in size and configuration. There is no mass, hemorrhage, extra-axial fluid collection, or midline shift. Gray-white compartments appear normal. Bony calvarium appears intact. No mastoid air cells are clear.  IMPRESSION: Study within normal limits.   Electronically Signed   By: Bretta Bang M.D.    On: 08/06/2013 22:00    EKG Interpretation   None       MDM   1. Fall, initial encounter   2. Thoracic sprain and strain, initial encounter    Fall tonight ago with questionable loss of consciousness. Patient complains of pain in her right upper back and right arm. As well as headaches. She denies any nausea or vomiting. She denies any dizziness or lightheadedness. No neck, back, chest or abdominal pain. Neurologically intact.   She is right upper thoracic back pain that is worse with palpation or movement. There is no weakness, numbness or tingling in her arms bilaterally. Do not suspect cervical radiculopathy. We'll treat for musculoskeletal pain with anti-inflammatories and analgesics. Return precautions discussed.   Glynn Octave, MD 08/06/13 [id number]

## 2013-09-13 ENCOUNTER — Emergency Department (HOSPITAL_COMMUNITY)
Admission: EM | Admit: 2013-09-13 | Discharge: 2013-09-13 | Disposition: A | Discharge status: Home / Self Care | Payer: BC Managed Care – PPO | Attending: Emergency Medicine | Admitting: Emergency Medicine

## 2013-09-13 ENCOUNTER — Emergency Department (HOSPITAL_COMMUNITY): Payer: BC Managed Care – PPO

## 2013-09-13 ENCOUNTER — Encounter (HOSPITAL_COMMUNITY): Payer: Self-pay | Admitting: Emergency Medicine

## 2013-09-13 DIAGNOSIS — J3489 Other specified disorders of nose and nasal sinuses: Secondary | ICD-10-CM | POA: Insufficient documentation

## 2013-09-13 DIAGNOSIS — F3289 Other specified depressive episodes: Secondary | ICD-10-CM | POA: Insufficient documentation

## 2013-09-13 DIAGNOSIS — Z79899 Other long term (current) drug therapy: Secondary | ICD-10-CM | POA: Insufficient documentation

## 2013-09-13 DIAGNOSIS — F329 Major depressive disorder, single episode, unspecified: Secondary | ICD-10-CM | POA: Insufficient documentation

## 2013-09-13 DIAGNOSIS — R059 Cough, unspecified: Secondary | ICD-10-CM | POA: Insufficient documentation

## 2013-09-13 DIAGNOSIS — J45909 Unspecified asthma, uncomplicated: Secondary | ICD-10-CM | POA: Insufficient documentation

## 2013-09-13 DIAGNOSIS — IMO0001 Reserved for inherently not codable concepts without codable children: Secondary | ICD-10-CM | POA: Insufficient documentation

## 2013-09-13 DIAGNOSIS — M129 Arthropathy, unspecified: Secondary | ICD-10-CM | POA: Insufficient documentation

## 2013-09-13 DIAGNOSIS — R0989 Other specified symptoms and signs involving the circulatory and respiratory systems: Secondary | ICD-10-CM | POA: Insufficient documentation

## 2013-09-13 DIAGNOSIS — Z794 Long term (current) use of insulin: Secondary | ICD-10-CM | POA: Insufficient documentation

## 2013-09-13 DIAGNOSIS — R5381 Other malaise: Secondary | ICD-10-CM | POA: Insufficient documentation

## 2013-09-13 DIAGNOSIS — Z87891 Personal history of nicotine dependence: Secondary | ICD-10-CM | POA: Insufficient documentation

## 2013-09-13 DIAGNOSIS — E785 Hyperlipidemia, unspecified: Secondary | ICD-10-CM | POA: Insufficient documentation

## 2013-09-13 DIAGNOSIS — IMO0002 Reserved for concepts with insufficient information to code with codable children: Secondary | ICD-10-CM | POA: Insufficient documentation

## 2013-09-13 DIAGNOSIS — R05 Cough: Secondary | ICD-10-CM | POA: Insufficient documentation

## 2013-09-13 DIAGNOSIS — F411 Generalized anxiety disorder: Secondary | ICD-10-CM | POA: Insufficient documentation

## 2013-09-13 DIAGNOSIS — I10 Essential (primary) hypertension: Secondary | ICD-10-CM | POA: Insufficient documentation

## 2013-09-13 DIAGNOSIS — B9789 Other viral agents as the cause of diseases classified elsewhere: Secondary | ICD-10-CM | POA: Insufficient documentation

## 2013-09-13 DIAGNOSIS — H409 Unspecified glaucoma: Secondary | ICD-10-CM | POA: Insufficient documentation

## 2013-09-13 DIAGNOSIS — E119 Type 2 diabetes mellitus without complications: Secondary | ICD-10-CM | POA: Insufficient documentation

## 2013-09-13 DIAGNOSIS — G4733 Obstructive sleep apnea (adult) (pediatric): Secondary | ICD-10-CM | POA: Insufficient documentation

## 2013-09-13 MED ORDER — ACETAMINOPHEN 325 MG PO TABS
650.0000 mg | ORAL_TABLET | Freq: Four times a day (QID) | ORAL | Status: DC | PRN
  Administered 2013-09-13: 650 mg via ORAL
  Filled 2013-09-13 (×2): qty 2

## 2013-09-13 MED ORDER — HYDROCOD POLST-CHLORPHEN POLST 10-8 MG/5ML PO LQCR: 5.0000 mL | Freq: Two times a day (BID) | ORAL | Status: DC | PRN

## 2013-09-13 MED ORDER — OSELTAMIVIR PHOSPHATE 75 MG PO CAPS: 75.0000 mg | ORAL_CAPSULE | Freq: Two times a day (BID) | ORAL | Status: DC

## 2013-09-13 MED ORDER — KETOROLAC TROMETHAMINE 30 MG/ML IJ SOLN
30.0000 mg | Freq: Once | INTRAMUSCULAR | Status: AC
  Administered 2013-09-13: 30 mg via INTRAMUSCULAR
  Filled 2013-09-13: qty 1

## 2013-09-13 MED ORDER — BENZONATATE 100 MG PO CAPS: 100.0000 mg | ORAL_CAPSULE | Freq: Three times a day (TID) | ORAL | Status: DC

## 2013-09-13 NOTE — ED Provider Notes (Signed)
Medical screening examination/treatment/procedure(s) were performed by non-physician practitioner and as supervising physician I was immediately available for consultation/collaboration.  EKG Interpretation   None         Lyanne Co, MD 09/13/13 [phone]

## 2013-09-13 NOTE — ED Provider Notes (Signed)
CSN: [id number]     Arrival date & time 09/13/13  1119 History  This chart was scribed for non-physician practitioner, Rhea Bleacher, PA-C working with Lyanne Co, MD by Greggory Stallion, ED scribe. This patient was seen in room WTR6/WTR6 and the patient's care was started at 12:58 PM.    Chief Complaint  Patient presents with  . Cough  . Generalized Body Aches   The history is provided by the patient. No language interpreter was used.   HPI Comments: [name] is a 57 y.o. female with history of diabetes who presents to the Emergency Department complaining of fever, cough, congestion, rhinorrhea and generalized body aches that started 2 days ago. States symptoms worsened yesterday. She has taken zinc, 800 mg ibuprofen, and OTC chest congestion medication with no relief. Denies sore throat, ear pain, SOB, chest pain. Pt has history of asthma but denies history of COPD or other lung problems. She has not gotten a flu shot this year.   Past Medical History  Diagnosis Date  . Diabetes mellitus 1999    T2DM  . H/O gestational diabetes mellitus, not currently pregnant   . Asthma   . Hyperlipidemia   . Hypertension   . Obesity   . OSA (obstructive sleep apnea)   . Glaucoma   . Achilles rupture   . Depression   . Arthritis   . Anxiety   . Heart murmur    Past Surgical History  Procedure Laterality Date  . Cholecystectomy  1991  . Cesarean section  12/10/89  . Breast lumpectomy      Right breast  . Tubal ligation    . Back surgery     No family history on file. History  Substance Use Topics  . Smoking status: Former Games developer  . Smokeless tobacco: Not on file  . Alcohol Use: Yes     Comment: occasional   OB History   Grav Para Term Preterm Abortions TAB SAB Ect Mult Living   1 1        1      Review of Systems  Constitutional: Positive for fever and fatigue.  HENT: Positive for congestion and rhinorrhea. Negative for ear pain and sore throat.   Respiratory: Positive for  cough. Negative for shortness of breath.   Cardiovascular: Negative for chest pain.  All other systems reviewed and are negative.    Allergies  Eggs or egg-derived products; Milk-related compounds; and Pork-derived products  Home Medications   Current Outpatient Rx  Name  Route  Sig  Dispense  Refill  . albuterol (PROVENTIL HFA;VENTOLIN HFA) 108 (90 BASE) MCG/ACT inhaler   Inhalation   Inhale 2 puffs into the lungs every 6 (six) hours as needed for wheezing or shortness of breath. For shortness of breath         . atorvastatin (LIPITOR) 20 MG tablet   Oral   Take 20 mg by mouth every evening.          . brinzolamide (AZOPT) 1 % ophthalmic suspension   Both Eyes   Place 1 drop into both eyes 2 (two) times daily.   10 mL   10   . cyclobenzaprine (FLEXERIL) 10 MG tablet   Oral   Take 1 tablet (10 mg total) by mouth 2 (two) times daily as needed for muscle spasms.   20 tablet   0   . FLUoxetine (PROZAC) 40 MG capsule   Oral   Take 40 mg by mouth at bedtime.          Marland Kitchen  HYDROcodone-acetaminophen (NORCO/VICODIN) 5-325 MG per tablet   Oral   Take 2 tablets by mouth every 4 (four) hours as needed.   10 tablet   0   . ibuprofen (ADVIL,MOTRIN) 800 MG tablet   Oral   Take 1 tablet (800 mg total) by mouth 3 (three) times daily.   21 tablet   0   . insulin aspart (NOVOLOG) 100 UNIT/ML injection   Subcutaneous   Inject 5-7 Units into the skin 3 (three) times daily with meals. Per sliding scale         . insulin glargine (LANTUS) 100 UNIT/ML injection   Subcutaneous   Inject 40 Units into the skin at bedtime.          . metFORMIN (GLUCOPHAGE) 1000 MG tablet   Oral   Take 1,000 mg by mouth 2 (two) times daily with a meal.         . naproxen sodium (ANAPROX) 220 MG tablet   Oral   Take 220-440 mg by mouth 2 (two) times daily with a meal.          . travoprost, benzalkonium, (TRAVATAN) 0.004 % ophthalmic solution   Both Eyes   Place 1 drop into both eyes  at bedtime.         . Fluticasone-Salmeterol (ADVAIR) 250-50 MCG/DOSE AEPB   Inhalation   Inhale 1 puff into the lungs 2 (two) times daily.   180 each   3    BP 198/64  Pulse 89  Temp(Src) 102.9 F (39.4 C) (Oral)  Resp 16  SpO2 98%  Physical Exam  Nursing note and vitals reviewed. Constitutional: She appears well-developed and well-nourished. No distress.  HENT:  Head: Normocephalic and atraumatic.  Right Ear: Tympanic membrane, external ear and ear canal normal.  Left Ear: Tympanic membrane, external ear and ear canal normal.  Nose: Mucosal edema present. No rhinorrhea.  Mouth/Throat: Uvula is midline, oropharynx is clear and moist and mucous membranes are normal. Mucous membranes are not dry. No oral lesions. No trismus in the jaw. No uvula swelling. No oropharyngeal exudate, posterior oropharyngeal edema, posterior oropharyngeal erythema or tonsillar abscesses.  Eyes: Conjunctivae and EOM are normal. Right eye exhibits no discharge. Left eye exhibits no discharge.  Neck: Normal range of motion. Neck supple.  Cardiovascular: Normal rate, regular rhythm and normal heart sounds.   Pulmonary/Chest: Effort normal and breath sounds normal. No respiratory distress. She has no wheezes. She has no rales.  Abdominal: Soft. There is no tenderness.  Musculoskeletal: Normal range of motion.  Lymphadenopathy:    She has no cervical adenopathy.  Neurological: She is alert.  Skin: Skin is warm and dry.  Psychiatric: She has a normal mood and affect. Her behavior is normal.    ED Course  Procedures (including critical care time)  DIAGNOSTIC STUDIES: Oxygen Saturation is 98% on RA, normal by my interpretation.    COORDINATION OF CARE: 1:05 PM-Discussed treatment plan which includes chest xray, Toradol in the ED and Theraflu with pt at bedside and pt agreed to plan.   Labs Review Labs Reviewed - No data to display Imaging Review Dg Chest 2 View  09/13/2013   CLINICAL DATA:   Cough and fever for 3 days.  EXAM: CHEST  2 VIEW  COMPARISON:  PA and lateral chest 12/14/2011.  FINDINGS: The lungs are clear. Heart size is upper normal. No pneumothorax or pleural effusion.  IMPRESSION: No acute disease.   Electronically Signed   By: Drusilla Kanner M.D.  On: 09/13/2013 13:57    EKG Interpretation   None      Vital signs reviewed and are as follows: Filed Vitals:   09/13/13 1412  BP: 168/70  Pulse: 80  Temp:   Resp: 18   CXR neg. Pt informed. Suspect influenza. Patient counseled on supportive care for influenza and s/s to return including worsening symptoms, persistent fever, persistent vomiting, or if they have any other concerns.  Urged to see PCP if symptoms persist for more than 3 days. Patient verbalizes understanding and agrees with plan.   Patient counseled on use of narcotic cough medications. Counseled not to combine these medications with others containing tylenol. Urged not to drink alcohol, drive, or perform any other activities that requires focus while taking these medications. The patient verbalizes understanding and agrees with the plan.    MDM   1. Influenza-like illness    Patient with symptoms consistent with influenza.  CXR ordered 2/2 comorbidities to r/o PNA and need for abx. She is candidate for Tamiflu, prescribed given comorbidities. No indications for hospitalization. She Vitals are stable.  No signs of dehydration, tolerating PO's.  Lungs are clear.  Supportive therapy indicated with return if symptoms worsen.  Patient counseled.   I personally performed the services described in this documentation, which was scribed in my presence. The recorded information has been reviewed and is accurate.   Renne Crigler, PA-C 09/13/13 [phone]

## 2013-09-13 NOTE — ED Notes (Signed)
Pt c/o cough and body aches x2 days.  

## 2014-01-13 ENCOUNTER — Emergency Department (HOSPITAL_COMMUNITY)
Admission: EM | Admit: 2014-01-13 | Discharge: 2014-01-13 | Disposition: A | Discharge status: Home / Self Care | Payer: BC Managed Care – PPO | Attending: Emergency Medicine | Admitting: Emergency Medicine

## 2014-01-13 ENCOUNTER — Encounter (HOSPITAL_COMMUNITY): Payer: Self-pay | Admitting: Emergency Medicine

## 2014-01-13 ENCOUNTER — Emergency Department (HOSPITAL_COMMUNITY): Payer: BC Managed Care – PPO

## 2014-01-13 DIAGNOSIS — E785 Hyperlipidemia, unspecified: Secondary | ICD-10-CM | POA: Insufficient documentation

## 2014-01-13 DIAGNOSIS — J189 Pneumonia, unspecified organism: Secondary | ICD-10-CM

## 2014-01-13 DIAGNOSIS — E119 Type 2 diabetes mellitus without complications: Secondary | ICD-10-CM | POA: Insufficient documentation

## 2014-01-13 DIAGNOSIS — F329 Major depressive disorder, single episode, unspecified: Secondary | ICD-10-CM | POA: Insufficient documentation

## 2014-01-13 DIAGNOSIS — J159 Unspecified bacterial pneumonia: Secondary | ICD-10-CM | POA: Insufficient documentation

## 2014-01-13 DIAGNOSIS — J029 Acute pharyngitis, unspecified: Secondary | ICD-10-CM | POA: Insufficient documentation

## 2014-01-13 DIAGNOSIS — R011 Cardiac murmur, unspecified: Secondary | ICD-10-CM | POA: Insufficient documentation

## 2014-01-13 DIAGNOSIS — F3289 Other specified depressive episodes: Secondary | ICD-10-CM | POA: Insufficient documentation

## 2014-01-13 DIAGNOSIS — Z794 Long term (current) use of insulin: Secondary | ICD-10-CM | POA: Insufficient documentation

## 2014-01-13 DIAGNOSIS — Z79899 Other long term (current) drug therapy: Secondary | ICD-10-CM | POA: Insufficient documentation

## 2014-01-13 DIAGNOSIS — Z8669 Personal history of other diseases of the nervous system and sense organs: Secondary | ICD-10-CM | POA: Insufficient documentation

## 2014-01-13 DIAGNOSIS — I10 Essential (primary) hypertension: Secondary | ICD-10-CM | POA: Insufficient documentation

## 2014-01-13 DIAGNOSIS — M129 Arthropathy, unspecified: Secondary | ICD-10-CM | POA: Insufficient documentation

## 2014-01-13 DIAGNOSIS — E669 Obesity, unspecified: Secondary | ICD-10-CM | POA: Insufficient documentation

## 2014-01-13 DIAGNOSIS — J45909 Unspecified asthma, uncomplicated: Secondary | ICD-10-CM | POA: Insufficient documentation

## 2014-01-13 DIAGNOSIS — F411 Generalized anxiety disorder: Secondary | ICD-10-CM | POA: Insufficient documentation

## 2014-01-13 DIAGNOSIS — H9209 Otalgia, unspecified ear: Secondary | ICD-10-CM | POA: Insufficient documentation

## 2014-01-13 DIAGNOSIS — Z87891 Personal history of nicotine dependence: Secondary | ICD-10-CM | POA: Insufficient documentation

## 2014-01-13 LAB — I-STAT CHEM 8, ED
BUN: 13 mg/dL (ref 6–23)
CHLORIDE: 109 meq/L (ref 96–112)
Calcium, Ion: 1.1 mmol/L — ABNORMAL LOW (ref 1.12–1.23)
Creatinine, Ser: 1.1 mg/dL (ref 0.50–1.10)
GLUCOSE: 160 mg/dL — AB (ref 70–99)
HCT: 37 % (ref 36.0–46.0)
Hemoglobin: 12.6 g/dL (ref 12.0–15.0)
Potassium: 3.6 mEq/L — ABNORMAL LOW (ref 3.7–5.3)
Sodium: 139 mEq/L (ref 137–147)
TCO2: 24 mmol/L (ref 0–100)

## 2014-01-13 LAB — CBC WITH DIFFERENTIAL/PLATELET
Basophils Absolute: 0 10*3/uL (ref 0.0–0.1)
Basophils Relative: 1 % (ref 0–1)
Eosinophils Absolute: 0.1 10*3/uL (ref 0.0–0.7)
Eosinophils Relative: 1 % (ref 0–5)
HEMATOCRIT: 36 % (ref 36.0–46.0)
Hemoglobin: 11.5 g/dL — ABNORMAL LOW (ref 12.0–15.0)
LYMPHS PCT: 26 % (ref 12–46)
Lymphs Abs: 1.3 10*3/uL (ref 0.7–4.0)
MCH: 26.3 pg (ref 26.0–34.0)
MCHC: 31.9 g/dL (ref 30.0–36.0)
MCV: 82.4 fL (ref 78.0–100.0)
MONO ABS: 0.5 10*3/uL (ref 0.1–1.0)
Monocytes Relative: 9 % (ref 3–12)
NEUTROS ABS: 3.1 10*3/uL (ref 1.7–7.7)
Neutrophils Relative %: 63 % (ref 43–77)
Platelets: 165 10*3/uL (ref 150–400)
RBC: 4.37 MIL/uL (ref 3.87–5.11)
RDW: 14.7 % (ref 11.5–15.5)
WBC: 4.9 10*3/uL (ref 4.0–10.5)

## 2014-01-13 LAB — CBG MONITORING, ED: Glucose-Capillary: 155 mg/dL — ABNORMAL HIGH (ref 70–99)

## 2014-01-13 MED ORDER — HYDROCODONE-HOMATROPINE 5-1.5 MG/5ML PO SYRP: 5.0000 mL | ORAL_SOLUTION | Freq: Four times a day (QID) | ORAL | Status: DC | PRN

## 2014-01-13 MED ORDER — LEVOFLOXACIN 750 MG PO TABS: 750.0000 mg | ORAL_TABLET | Freq: Every day | ORAL | Status: DC

## 2014-01-13 MED ORDER — GUAIFENESIN 100 MG/5ML PO SYRP: 100.0000 mg | ORAL_SOLUTION | ORAL | Status: DC | PRN

## 2014-01-13 MED ORDER — PREDNISONE 10 MG PO TABS: ORAL_TABLET | ORAL | Status: DC

## 2014-01-13 MED ORDER — ALBUTEROL SULFATE (2.5 MG/3ML) 0.083% IN NEBU
5.0000 mg | INHALATION_SOLUTION | Freq: Once | RESPIRATORY_TRACT | Status: AC
  Administered 2014-01-13: 5 mg via RESPIRATORY_TRACT
  Filled 2014-01-13: qty 6

## 2014-01-13 MED ORDER — ALBUTEROL SULFATE HFA 108 (90 BASE) MCG/ACT IN AERS
2.0000 | INHALATION_SPRAY | Freq: Once | RESPIRATORY_TRACT | Status: AC
  Administered 2014-01-13: 2 via RESPIRATORY_TRACT
  Filled 2014-01-13: qty 13.4

## 2014-01-13 NOTE — ED Notes (Signed)
PT. Reports having congested cough, bloody tinge.  Body aches, generalized weakness.  Does no know if she had a fever,.  Pt. Having intermittent chills. Pt. Is stable, no distress noted

## 2014-01-13 NOTE — ED Provider Notes (Signed)
CSN: [id number]     Arrival date & time 01/13/14  1497 History   First MD Initiated Contact with Patient 01/13/14 1009     Chief Complaint  Patient presents with  . Influenza     (Consider location/radiation/quality/duration/timing/severity/associated sxs/prior Treatment) HPI [name] is a 58 y.o. female who presents emergency department complaining of body aches, generalized weakness, cough, congestion. States that she has noticed some blood in her sputum when she coughs. She reports associated chest pain with cough only. States she has been taking over-the-counter medications for cold and flu with no relief. States that her symptoms have been there for 4 days and then just worsening. She states she has no energy to do anything.  Past Medical History  Diagnosis Date  . Diabetes mellitus 1999    T2DM  . H/O gestational diabetes mellitus, not currently pregnant   . Asthma   . Hyperlipidemia   . Hypertension   . Obesity   . OSA (obstructive sleep apnea)   . Glaucoma   . Achilles rupture   . Depression   . Arthritis   . Anxiety   . Heart murmur    Past Surgical History  Procedure Laterality Date  . Cholecystectomy  1991  . Cesarean section  12/10/89  . Breast lumpectomy      Right breast  . Tubal ligation    . Back surgery     No family history on file. History  Substance Use Topics  . Smoking status: Former Smoker    Types: Cigarettes  . Smokeless tobacco: Not on file  . Alcohol Use: Yes     Comment: occasional   OB History   Grav Para Term Preterm Abortions TAB SAB Ect Mult Living   1 1        1      Review of Systems  Constitutional: Negative for fever and chills.  HENT: Positive for congestion, ear pain and sore throat.   Respiratory: Positive for cough. Negative for chest tightness and shortness of breath.   Cardiovascular: Negative for chest pain, palpitations and leg swelling.  Gastrointestinal: Negative for nausea, vomiting, abdominal pain and  diarrhea.  Genitourinary: Negative for dysuria, flank pain and pelvic pain.  Musculoskeletal: Positive for myalgias. Negative for arthralgias, neck pain and neck stiffness.  Skin: Negative for rash.  Neurological: Positive for weakness. Negative for dizziness and headaches.  All other systems reviewed and are negative.     Allergies  Eggs or egg-derived products; Milk-related compounds; and Pork-derived products  Home Medications   Prior to Admission medications   Medication Sig Start Date End Date Taking? Authorizing Provider  albuterol (PROVENTIL HFA;VENTOLIN HFA) 108 (90 BASE) MCG/ACT inhaler Inhale 2 puffs into the lungs every 6 (six) hours as needed for wheezing or shortness of breath. For shortness of breath 02/11/13  Yes Robyn Haber, MD  Ascorbic Acid (VITAMIN C) 1000 MG tablet Take 1,000 mg by mouth 3 (three) times daily.   Yes Historical Provider, MD  atorvastatin (LIPITOR) 20 MG tablet Take 20 mg by mouth every evening.    Yes Historical Provider, MD  B Complex-C (B-COMPLEX WITH VITAMIN C) tablet Take 1 tablet by mouth daily.   Yes Historical Provider, MD  Cholecalciferol (VITAMIN D-3) 5000 UNITS TABS Take 5,000 Units by mouth daily.   Yes Historical Provider, MD  FLUoxetine (PROZAC) 40 MG capsule Take 40 mg by mouth daily.    Yes Historical Provider, MD  ibuprofen (ADVIL,MOTRIN) 200 MG tablet Take 400-800 mg by  mouth every 6 (six) hours as needed for moderate pain.   Yes Historical Provider, MD  insulin aspart (NOVOLOG) 100 UNIT/ML injection Inject 5-7 Units into the skin 3 (three) times daily with meals. Per sliding scale   Yes Historical Provider, MD  insulin glargine (LANTUS) 100 UNIT/ML injection Inject 40 Units into the skin at bedtime.    Yes Historical Provider, MD  metFORMIN (GLUCOPHAGE) 1000 MG tablet Take 1,000 mg by mouth 2 (two) times daily with a meal. 12/18/12  Yes Tiffany G Greene, PA-C  SIMBRINZA 1-0.2 % SUSP Place 1 drop into both eyes 2 (two) times daily.  01/02/14  Yes Historical Provider, MD  travoprost, benzalkonium, (TRAVATAN) 0.004 % ophthalmic solution Place 1 drop into both eyes at bedtime.   Yes Historical Provider, MD  valsartan-hydrochlorothiazide (DIOVAN-HCT) 160-25 MG per tablet Take 1 tablet by mouth daily. 01/02/14  Yes Historical Provider, MD   BP 170/50  Pulse 65  Temp(Src) 99.6 F (37.6 C) (Oral)  Resp 22  SpO2 100% Physical Exam  Nursing note and vitals reviewed. Constitutional: She appears well-developed and well-nourished. No distress.  HENT:  Head: Normocephalic.  Eyes: Conjunctivae are normal. Pupils are equal, round, and reactive to light.  Neck: Neck supple.  Cardiovascular: Normal rate, regular rhythm and normal heart sounds.   Pulmonary/Chest: Effort normal and breath sounds normal. No respiratory distress. She has no wheezes. She has no rales.  Abdominal: Soft. Bowel sounds are normal. She exhibits no distension. There is no tenderness. There is no rebound.  Musculoskeletal: She exhibits no edema.  Neurological: She is alert.  Skin: Skin is warm and dry.  Psychiatric: She has a normal mood and affect. Her behavior is normal.    ED Course  Procedures (including critical care time) Labs Review Labs Reviewed  CBC WITH DIFFERENTIAL - Abnormal; Notable for the following:    Hemoglobin 11.5 (*)    All other components within normal limits  I-STAT CHEM 8, ED - Abnormal; Notable for the following:    Potassium 3.6 (*)    Glucose, Bld 160 (*)    Calcium, Ion 1.10 (*)    All other components within normal limits  CBG MONITORING, ED - Abnormal; Notable for the following:    Glucose-Capillary 155 (*)    All other components within normal limits    Imaging Review Dg Chest 2 View  01/13/2014   CLINICAL DATA:  Cough, short of breath  EXAM: CHEST  2 VIEW  FINDINGS: Normal cardiac silhouette. There is the perihilar airspace disease greater on the left lung than the right which is new from prior. No pleural fluid. No  pneumothorax. Anterior cervical fusion.  IMPRESSION: Perihilar airspace disease suggests asymmetric pulmonary edema versus atypical infection.   Electronically Signed   By: Suzy Bouchard M.D.   On: 01/13/2014 12:46     EKG Interpretation None      MDM   Final diagnoses:  CAP (community acquired pneumonia)    Patient with upper respiratory symptoms, cough. Vital signs all within normal except for blood pressure which is mildly elevated. Patient is complaining of nasal congestion, cough, body aches, low-grade fevers at home. Here patient's temperature is 99.6 orally, she's not hypoxic, her oxygen saturation is 100% room air. She's not tachycardic. I do not think pt is septic. No concern for ACS or PE given normal VS and no CP. CXR and labs obtained. CXR showing airspace disease: asymmetric pulmonary edema vs atypical infection. Pt has no hx of pulmonary edema,  no prior heart problems. Given her symptoms and history, this is most likely pneumonia. Will start on levaquin, prednisone, cough medications. Follow up with cpc closely. If not improving will need further work up. Discussed with pt, agrees to plan. At this time, pt is appropriate for outpatient treatment.    Filed Vitals:   01/13/14 1115 01/13/14 1123 01/13/14 1130 01/13/14 1230  BP: 155/60  170/50 145/54  Pulse: 61  65 70  Temp:      TempSrc:      Resp:      SpO2: 98% 100% 100% 98%      Dazha Kempa A Charmayne Odell, PA-C 01/13/14 1303

## 2014-01-13 NOTE — Discharge Instructions (Signed)
Make sure to rest, drink plenty of fluids. Ibuprofen/tylenol for fever and pain. Robitussin and hycodan for cough. Prednisone as prescribed until all gone for inflammation. levaquin for infection until all gone. Follow up with primary care doctor for recheck.    Pneumonia, Adult Pneumonia is an infection of the lungs.  CAUSES Pneumonia may be caused by bacteria or a virus. Usually, these infections are caused by breathing infectious particles into the lungs (respiratory tract). SYMPTOMS   Cough.  Fever.  Chest pain.  Increased rate of breathing.  Wheezing.  Mucus production. DIAGNOSIS  If you have the common symptoms of pneumonia, your caregiver will typically confirm the diagnosis with a chest X-ray. The X-ray will show an abnormality in the lung (pulmonary infiltrate) if you have pneumonia. Other tests of your blood, urine, or sputum may be done to find the specific cause of your pneumonia. Your caregiver may also do tests (blood gases or pulse oximetry) to see how well your lungs are working. TREATMENT  Some forms of pneumonia may be spread to other people when you cough or sneeze. You may be asked to wear a mask before and during your exam. Pneumonia that is caused by bacteria is treated with antibiotic medicine. Pneumonia that is caused by the influenza virus may be treated with an antiviral medicine. Most other viral infections must run their course. These infections will not respond to antibiotics.  PREVENTION A pneumococcal shot (vaccine) is available to prevent a common bacterial cause of pneumonia. This is usually suggested for:  People over 37 years old.  Patients on chemotherapy.  People with chronic lung problems, such as bronchitis or emphysema.  People with immune system problems. If you are over 65 or have a high risk condition, you may receive the pneumococcal vaccine if you have not received it before. In some countries, a routine influenza vaccine is also  recommended. This vaccine can help prevent some cases of pneumonia.You may be offered the influenza vaccine as part of your care. If you smoke, it is time to quit. You may receive instructions on how to stop smoking. Your caregiver can provide medicines and counseling to help you quit. HOME CARE INSTRUCTIONS   Cough suppressants may be used if you are losing too much rest. However, coughing protects you by clearing your lungs. You should avoid using cough suppressants if you can.  Your caregiver may have prescribed medicine if he or she thinks your pneumonia is caused by a bacteria or influenza. Finish your medicine even if you start to feel better.  Your caregiver may also prescribe an expectorant. This loosens the mucus to be coughed up.  Only take over-the-counter or prescription medicines for pain, discomfort, or fever as directed by your caregiver.  Do not smoke. Smoking is a common cause of bronchitis and can contribute to pneumonia. If you are a smoker and continue to smoke, your cough may last several weeks after your pneumonia has cleared.  A cold steam vaporizer or humidifier in your room or home may help loosen mucus.  Coughing is often worse at night. Sleeping in a semi-upright position in a recliner or using a couple pillows under your head will help with this.  Get rest as you feel it is needed. Your body will usually let you know when you need to rest. SEEK IMMEDIATE MEDICAL CARE IF:   Your illness becomes worse. This is especially true if you are elderly or weakened from any other disease.  You cannot control your  cough with suppressants and are losing sleep.  You begin coughing up blood.  You develop pain which is getting worse or is uncontrolled with medicines.  You have a fever.  Any of the symptoms which initially brought you in for treatment are getting worse rather than better.  You develop shortness of breath or chest pain. MAKE SURE YOU:   Understand these  instructions.  Will watch your condition.  Will get help right away if you are not doing well or get worse. Document Released: 09/04/2005 Document Revised: 11/27/2011 Document Reviewed: 11/24/2010 [facility] Patient Information 2014 [place], [place].

## 2014-01-13 NOTE — ED Notes (Signed)
Pt.  Called out and asked if her blood sugar could be checked.  Pt. Reports , "I feel funny"  Vitals stable, CBG is 155.  Pt. Feels better.

## 2014-01-13 NOTE — ED Provider Notes (Signed)
Medical screening examination/treatment/procedure(s) were performed by non-physician practitioner and as supervising physician I was immediately available for consultation/collaboration.   EKG Interpretation None       Merryl Hacker, MD 01/13/14 [phone]

## 2014-02-16 ENCOUNTER — Other Ambulatory Visit: Payer: Self-pay | Admitting: Family Medicine

## 2014-04-08 ENCOUNTER — Emergency Department (HOSPITAL_COMMUNITY)
Admission: EM | Admit: 2014-04-08 | Discharge: 2014-04-08 | Disposition: A | Discharge status: Home / Self Care | Payer: BC Managed Care – PPO | Attending: Emergency Medicine | Admitting: Emergency Medicine

## 2014-04-08 ENCOUNTER — Encounter (HOSPITAL_COMMUNITY): Payer: Self-pay | Admitting: Emergency Medicine

## 2014-04-08 ENCOUNTER — Emergency Department (HOSPITAL_COMMUNITY): Payer: BC Managed Care – PPO

## 2014-04-08 DIAGNOSIS — Z794 Long term (current) use of insulin: Secondary | ICD-10-CM | POA: Insufficient documentation

## 2014-04-08 DIAGNOSIS — F411 Generalized anxiety disorder: Secondary | ICD-10-CM | POA: Insufficient documentation

## 2014-04-08 DIAGNOSIS — E1142 Type 2 diabetes mellitus with diabetic polyneuropathy: Secondary | ICD-10-CM | POA: Insufficient documentation

## 2014-04-08 DIAGNOSIS — M79609 Pain in unspecified limb: Secondary | ICD-10-CM | POA: Insufficient documentation

## 2014-04-08 DIAGNOSIS — I10 Essential (primary) hypertension: Secondary | ICD-10-CM | POA: Insufficient documentation

## 2014-04-08 DIAGNOSIS — Z87891 Personal history of nicotine dependence: Secondary | ICD-10-CM | POA: Insufficient documentation

## 2014-04-08 DIAGNOSIS — M129 Arthropathy, unspecified: Secondary | ICD-10-CM | POA: Insufficient documentation

## 2014-04-08 DIAGNOSIS — E1140 Type 2 diabetes mellitus with diabetic neuropathy, unspecified: Secondary | ICD-10-CM

## 2014-04-08 DIAGNOSIS — R011 Cardiac murmur, unspecified: Secondary | ICD-10-CM | POA: Insufficient documentation

## 2014-04-08 DIAGNOSIS — J45909 Unspecified asthma, uncomplicated: Secondary | ICD-10-CM | POA: Insufficient documentation

## 2014-04-08 DIAGNOSIS — E1149 Type 2 diabetes mellitus with other diabetic neurological complication: Secondary | ICD-10-CM | POA: Insufficient documentation

## 2014-04-08 DIAGNOSIS — M79672 Pain in left foot: Secondary | ICD-10-CM

## 2014-04-08 DIAGNOSIS — Z79899 Other long term (current) drug therapy: Secondary | ICD-10-CM | POA: Insufficient documentation

## 2014-04-08 DIAGNOSIS — E669 Obesity, unspecified: Secondary | ICD-10-CM | POA: Insufficient documentation

## 2014-04-08 DIAGNOSIS — H409 Unspecified glaucoma: Secondary | ICD-10-CM | POA: Insufficient documentation

## 2014-04-08 DIAGNOSIS — Z87828 Personal history of other (healed) physical injury and trauma: Secondary | ICD-10-CM | POA: Insufficient documentation

## 2014-04-08 LAB — CBG MONITORING, ED: Glucose-Capillary: 227 mg/dL — ABNORMAL HIGH (ref 70–99)

## 2014-04-08 MED ORDER — OXYCODONE-ACETAMINOPHEN 5-325 MG PO TABS
2.0000 | ORAL_TABLET | Freq: Once | ORAL | Status: AC
  Administered 2014-04-08: 2 via ORAL
  Filled 2014-04-08: qty 2

## 2014-04-08 MED ORDER — GABAPENTIN 300 MG PO CAPS: 300.0000 mg | ORAL_CAPSULE | Freq: Every day | ORAL | Status: DC

## 2014-04-08 MED ORDER — GABAPENTIN 300 MG PO CAPS
300.0000 mg | ORAL_CAPSULE | Freq: Once | ORAL | Status: AC
  Administered 2014-04-08: 300 mg via ORAL
  Filled 2014-04-08: qty 1

## 2014-04-08 MED ORDER — OXYCODONE-ACETAMINOPHEN 5-325 MG PO TABS: 1.0000 | ORAL_TABLET | Freq: Four times a day (QID) | ORAL | Status: DC | PRN

## 2014-04-08 NOTE — ED Notes (Signed)
Left toe/foot pain began last week, was bearable.  Over the weekend it has become progressively worse.  This morning pain unbearable; unable to put on shoe or flip flop.  + pedal pulse.  Pitting edema noted.  Denies injury.

## 2014-04-08 NOTE — ED Provider Notes (Signed)
CSN: [id number]     Arrival date & time 04/08/14  0449 History   First MD Initiated Contact with Patient 04/08/14 [phone]     Chief Complaint  Patient presents with  . Foot Pain     (Consider location/radiation/quality/duration/timing/severity/associated sxs/prior Treatment) HPI Pt is a 58yo female with hx of type 2 IDDM, hyperlipidemia, HTN, arthritis, anxiety and depression presenting to ED with c/o bilateral foot swelling, associated with gradually worsening pain in left foot that worsened yesterday. Pain is constant, 10/10, aching, sharp and occasional spasm.  Pt states pain was so bad yesterday she couldn't walk to the bathroom on her own. Reports taking acetaminophen and ibuprofen w/o relief.  Denies being on gabapentin.  States her PCP has not started her on any yet because "it hasn't been this bad until now"  Pt reports having a recent tooth abscess and pneumonia for which she had to be on antibiotics and prednisone which caused her CBG to go up and down.  Denies hx of known injury to the foot. Denies wounds. Denies fever, n/v/d.   Past Medical History  Diagnosis Date  . Diabetes mellitus 1999    T2DM  . H/O gestational diabetes mellitus, not currently pregnant   . Asthma   . Hyperlipidemia   . Hypertension   . Obesity   . OSA (obstructive sleep apnea)   . Glaucoma   . Achilles rupture   . Depression   . Arthritis   . Anxiety   . Heart murmur    Past Surgical History  Procedure Laterality Date  . Cholecystectomy  1991  . Cesarean section  12/10/89  . Breast lumpectomy      Right breast  . Tubal ligation    . Back surgery     No family history on file. History  Substance Use Topics  . Smoking status: Former Smoker    Types: Cigarettes  . Smokeless tobacco: Not on file  . Alcohol Use: Yes     Comment: occasional   OB History   Grav Para Term Preterm Abortions TAB SAB Ect Mult Living   1 1        1      Review of Systems  Constitutional: Negative for fever and  chills.  Respiratory: Negative for cough and shortness of breath.   Cardiovascular: Positive for leg swelling. Negative for chest pain and palpitations.  Gastrointestinal: Negative for nausea and vomiting.  Musculoskeletal: Positive for arthralgias and myalgias.  Skin: Negative for color change, pallor, rash and wound.  All other systems reviewed and are negative.     Allergies  Eggs or egg-derived products; Milk-related compounds; and Pork-derived products  Home Medications   Prior to Admission medications   Medication Sig Start Date End Date Taking? Authorizing Provider  albuterol (PROVENTIL HFA;VENTOLIN HFA) 108 (90 BASE) MCG/ACT inhaler Inhale 2 puffs into the lungs every 6 (six) hours as needed for wheezing or shortness of breath. For shortness of breath 02/11/13  Yes Robyn Haber, MD  Ascorbic Acid (VITAMIN C) 1000 MG tablet Take 1,000 mg by mouth 3 (three) times daily.   Yes Historical Provider, MD  B Complex-C (B-COMPLEX WITH VITAMIN C) tablet Take 1 tablet by mouth daily.   Yes Historical Provider, MD  Cholecalciferol (VITAMIN D-3) 5000 UNITS TABS Take 5,000 Units by mouth daily.   Yes Historical Provider, MD  ibuprofen (ADVIL,MOTRIN) 200 MG tablet Take 400-800 mg by mouth every 6 (six) hours as needed for moderate pain.   Yes Historical Provider,  MD  insulin aspart (NOVOLOG) 100 UNIT/ML injection Inject 5-7 Units into the skin 3 (three) times daily with meals. Per sliding scale   Yes Historical Provider, MD  insulin glargine (LANTUS) 100 UNIT/ML injection Inject 40 Units into the skin at bedtime.    Yes Historical Provider, MD  metFORMIN (GLUCOPHAGE) 1000 MG tablet Take 1 tablet (1,000 mg total) by mouth 2 (two) times daily. PATIENT NEEDS OFFICE VISIT FOR ADDITIONAL REFILLS 02/16/14  Yes Robyn Haber, MD  [name] 1-0.2 % SUSP Place 1 drop into both eyes 2 (two) times daily. 01/02/14  Yes Historical Provider, MD  travoprost, benzalkonium, (TRAVATAN) 0.004 % ophthalmic solution  Place 1 drop into both eyes at bedtime.   Yes Historical Provider, MD  valsartan-hydrochlorothiazide (DIOVAN-HCT) 160-25 MG per tablet Take 1 tablet by mouth daily. 01/02/14  Yes Historical Provider, MD  gabapentin (NEURONTIN) 300 MG capsule Take 1 capsule (300 mg total) by mouth daily. 04/08/14   Noland Fordyce, PA-C  oxyCODONE-acetaminophen (PERCOCET/ROXICET) 5-325 MG per tablet Take 1-2 tablets by mouth every 6 (six) hours as needed for severe pain. 04/08/14   Noland Fordyce, PA-C   BP 144/63  Pulse 57  Temp(Src) 97.6 F (36.4 C) (Oral)  Resp 20  Ht 5\' 1"  (1.549 m)  Wt 237 lb (107.502 kg)  BMI 44.80 kg/m2  SpO2 100% Physical Exam  Nursing note and vitals reviewed. Constitutional: She appears well-developed and well-nourished.  Pt lying in bed, moving her feet, appears uncomfortable.   HENT:  Head: Normocephalic and atraumatic.  Eyes: Conjunctivae are normal. No scleral icterus.  Neck: Normal range of motion.  Cardiovascular: Normal rate, regular rhythm and normal heart sounds.   Pulses:      Dorsalis pedis pulses are 2+ on the right side, and 2+ on the left side.       Posterior tibial pulses are 2+ on the right side, and 2+ on the left side.  Pulmonary/Chest: Effort normal and breath sounds normal. No respiratory distress. She has no wheezes. She has no rales.  Abdominal: Soft. She exhibits no distension. There is no tenderness.  Musculoskeletal: Normal range of motion. She exhibits edema and tenderness.  1+ pitting edema. Left foot: FROM, moderate tenderness to palpation, worse along plantar aspect and toes.  No calf tenderness. Right foot: FROM mild tenderness.   Neurological: She is alert.  Sensation in tact.   Skin: Skin is warm and dry. No rash noted. No erythema.  Skin in tact. No ecchymosis, erythema, or warmth. No red streaking, induration, or evidence of underlying infection.    ED Course  Procedures (including critical care time) Labs Review Labs Reviewed  CBG  MONITORING, ED - Abnormal; Notable for the following:    Glucose-Capillary 227 (*)    All other components within normal limits    Imaging Review Dg Foot Complete Left  04/08/2014   CLINICAL DATA:  Atraumatic diffuse swelling of the left foot  EXAM: LEFT FOOT - COMPLETE 3+ VIEW  COMPARISON:  None.  FINDINGS: The bones are adequately mineralized. There is no acute fracture or periosteal reaction. There is a mild hallux valgus type contour of the first ray. The joint spaces are reasonably well-maintained. Irregularity of the dorsal aspects of the tarsal navicular and cuboid may reflect old avulsion injuries or developmental processes. There are plantar and Achilles region calcaneal spurs.  The soft tissues exhibit no foreign bodies or gas collections.  IMPRESSION: Mild soft tissue swelling.  No acute bony abnormality.   Electronically Signed  By: David  Martinique   On: 04/08/2014 07:05     EKG Interpretation None      MDM   Final diagnoses:  Diabetic neuropathy, painful  Left foot pain    Left foot: no evidence of underlying infection. Doubt DVT due to bilateral foot swelling, no calf tenderness, erythema or warmth. Foot is neurovascularly in tact.  Plain films: mild soft tissue edema, otherwise unremarkable.  Will tx as diabetic neuropathy. Advised to f/u with PCP next week for recheck of symptoms.  Rx: gabapentin and percocet. Return precautions provided. Pt verbalized understanding and agreement with tx plan.     Noland Fordyce, PA-C 04/08/14 [phone]

## 2014-04-08 NOTE — ED Notes (Signed)
Pt. reports progressing left foot pain with swelling onset Sunday denies injury , states history of diabetic neuropathy .

## 2014-04-09 NOTE — ED Provider Notes (Signed)
Medical screening examination/treatment/procedure(s) were performed by non-physician practitioner and as supervising physician I was immediately available for consultation/collaboration.   EKG Interpretation None       Makana Feigel K Glendene Wyer-Rasch, MD 04/09/14 0005

## 2014-04-15 ENCOUNTER — Telehealth: Payer: Self-pay

## 2014-04-15 NOTE — Telephone Encounter (Signed)
Diabetic Bundle Pt is no longer a pt of Dr. Cruzita Lederer.

## 2014-06-17 ENCOUNTER — Emergency Department (HOSPITAL_COMMUNITY)
Admission: EM | Admit: 2014-06-17 | Discharge: 2014-06-18 | Disposition: A | Discharge status: Home / Self Care | Payer: BC Managed Care – PPO | Attending: Emergency Medicine | Admitting: Emergency Medicine

## 2014-06-17 ENCOUNTER — Emergency Department (HOSPITAL_COMMUNITY): Payer: BC Managed Care – PPO

## 2014-06-17 ENCOUNTER — Encounter (HOSPITAL_COMMUNITY): Payer: Self-pay | Admitting: Emergency Medicine

## 2014-06-17 DIAGNOSIS — E1149 Type 2 diabetes mellitus with other diabetic neurological complication: Secondary | ICD-10-CM | POA: Diagnosis not present

## 2014-06-17 DIAGNOSIS — M79609 Pain in unspecified limb: Secondary | ICD-10-CM | POA: Diagnosis not present

## 2014-06-17 DIAGNOSIS — R42 Dizziness and giddiness: Secondary | ICD-10-CM | POA: Insufficient documentation

## 2014-06-17 DIAGNOSIS — R209 Unspecified disturbances of skin sensation: Secondary | ICD-10-CM | POA: Diagnosis not present

## 2014-06-17 DIAGNOSIS — R011 Cardiac murmur, unspecified: Secondary | ICD-10-CM | POA: Diagnosis not present

## 2014-06-17 DIAGNOSIS — R141 Gas pain: Secondary | ICD-10-CM | POA: Diagnosis not present

## 2014-06-17 DIAGNOSIS — H538 Other visual disturbances: Secondary | ICD-10-CM | POA: Diagnosis not present

## 2014-06-17 DIAGNOSIS — J45901 Unspecified asthma with (acute) exacerbation: Secondary | ICD-10-CM | POA: Diagnosis not present

## 2014-06-17 DIAGNOSIS — IMO0001 Reserved for inherently not codable concepts without codable children: Secondary | ICD-10-CM | POA: Diagnosis not present

## 2014-06-17 DIAGNOSIS — Z8739 Personal history of other diseases of the musculoskeletal system and connective tissue: Secondary | ICD-10-CM | POA: Insufficient documentation

## 2014-06-17 DIAGNOSIS — F411 Generalized anxiety disorder: Secondary | ICD-10-CM | POA: Diagnosis not present

## 2014-06-17 DIAGNOSIS — M0680 Other specified rheumatoid arthritis, unspecified site: Secondary | ICD-10-CM | POA: Insufficient documentation

## 2014-06-17 DIAGNOSIS — R0602 Shortness of breath: Secondary | ICD-10-CM | POA: Insufficient documentation

## 2014-06-17 DIAGNOSIS — I10 Essential (primary) hypertension: Secondary | ICD-10-CM | POA: Diagnosis not present

## 2014-06-17 DIAGNOSIS — Z87891 Personal history of nicotine dependence: Secondary | ICD-10-CM | POA: Diagnosis not present

## 2014-06-17 DIAGNOSIS — Z79899 Other long term (current) drug therapy: Secondary | ICD-10-CM | POA: Insufficient documentation

## 2014-06-17 DIAGNOSIS — R200 Anesthesia of skin: Secondary | ICD-10-CM | POA: Insufficient documentation

## 2014-06-17 DIAGNOSIS — E669 Obesity, unspecified: Secondary | ICD-10-CM | POA: Insufficient documentation

## 2014-06-17 DIAGNOSIS — E1142 Type 2 diabetes mellitus with diabetic polyneuropathy: Secondary | ICD-10-CM | POA: Diagnosis not present

## 2014-06-17 DIAGNOSIS — E1140 Type 2 diabetes mellitus with diabetic neuropathy, unspecified: Secondary | ICD-10-CM | POA: Insufficient documentation

## 2014-06-17 DIAGNOSIS — R143 Flatulence: Secondary | ICD-10-CM | POA: Insufficient documentation

## 2014-06-17 DIAGNOSIS — M545 Low back pain, unspecified: Secondary | ICD-10-CM | POA: Diagnosis not present

## 2014-06-17 DIAGNOSIS — Z8632 Personal history of gestational diabetes: Secondary | ICD-10-CM | POA: Diagnosis not present

## 2014-06-17 DIAGNOSIS — F419 Anxiety disorder, unspecified: Secondary | ICD-10-CM | POA: Insufficient documentation

## 2014-06-17 DIAGNOSIS — Z794 Long term (current) use of insulin: Secondary | ICD-10-CM | POA: Insufficient documentation

## 2014-06-17 DIAGNOSIS — I160 Hypertensive urgency: Secondary | ICD-10-CM

## 2014-06-17 LAB — COMPREHENSIVE METABOLIC PANEL
ALBUMIN: 3.2 g/dL — AB (ref 3.5–5.2)
ALT: 12 U/L (ref 0–35)
AST: 17 U/L (ref 0–37)
Alkaline Phosphatase: 69 U/L (ref 39–117)
Anion gap: 13 (ref 5–15)
BUN: 19 mg/dL (ref 6–23)
CHLORIDE: 102 meq/L (ref 96–112)
CO2: 25 meq/L (ref 19–32)
CREATININE: 1.03 mg/dL (ref 0.50–1.10)
Calcium: 9.2 mg/dL (ref 8.4–10.5)
GFR calc Af Amer: 69 mL/min — ABNORMAL LOW (ref >90)
GFR, EST NON AFRICAN AMERICAN: 59 mL/min — AB (ref >90)
Glucose, Bld: 265 mg/dL — ABNORMAL HIGH (ref 70–99)
Potassium: 4.5 mEq/L (ref 3.7–5.3)
SODIUM: 140 meq/L (ref 137–147)
Total Bilirubin: <0.2 mg/dL — ABNORMAL LOW (ref 0.3–1.2)
Total Protein: 7.9 g/dL (ref 6.0–8.3)

## 2014-06-17 LAB — CBC WITH DIFFERENTIAL/PLATELET
BASOS ABS: 0 10*3/uL (ref 0.0–0.1)
BASOS PCT: 1 % (ref 0–1)
Eosinophils Absolute: 0.3 10*3/uL (ref 0.0–0.7)
Eosinophils Relative: 4 % (ref 0–5)
HEMATOCRIT: 34.6 % — AB (ref 36.0–46.0)
Hemoglobin: 11 g/dL — ABNORMAL LOW (ref 12.0–15.0)
Lymphocytes Relative: 26 % (ref 12–46)
Lymphs Abs: 1.6 10*3/uL (ref 0.7–4.0)
MCH: 25.4 pg — ABNORMAL LOW (ref 26.0–34.0)
MCHC: 31.8 g/dL (ref 30.0–36.0)
MCV: 79.9 fL (ref 78.0–100.0)
Monocytes Absolute: 0.4 10*3/uL (ref 0.1–1.0)
Monocytes Relative: 7 % (ref 3–12)
NEUTROS ABS: 3.9 10*3/uL (ref 1.7–7.7)
Neutrophils Relative %: 62 % (ref 43–77)
Platelets: 232 10*3/uL (ref 150–400)
RBC: 4.33 MIL/uL (ref 3.87–5.11)
RDW: 14.5 % (ref 11.5–15.5)
WBC: 6.2 10*3/uL (ref 4.0–10.5)

## 2014-06-17 LAB — URINALYSIS, ROUTINE W REFLEX MICROSCOPIC
BILIRUBIN URINE: NEGATIVE
Glucose, UA: NEGATIVE mg/dL
Hgb urine dipstick: NEGATIVE
Ketones, ur: NEGATIVE mg/dL
Leukocytes, UA: NEGATIVE
Nitrite: NEGATIVE
PH: 6.5 (ref 5.0–8.0)
Protein, ur: 30 mg/dL — AB
SPECIFIC GRAVITY, URINE: 1.01 (ref 1.005–1.030)
UROBILINOGEN UA: 1 mg/dL (ref 0.0–1.0)

## 2014-06-17 LAB — URINE MICROSCOPIC-ADD ON

## 2014-06-17 LAB — I-STAT TROPONIN, ED: TROPONIN I, POC: 0.01 ng/mL (ref 0.00–0.08)

## 2014-06-17 LAB — PRO B NATRIURETIC PEPTIDE: Pro B Natriuretic peptide (BNP): 116.8 pg/mL (ref 0–125)

## 2014-06-17 MED ORDER — MORPHINE SULFATE 4 MG/ML IJ SOLN
4.0000 mg | Freq: Once | INTRAMUSCULAR | Status: AC
  Administered 2014-06-17: 4 mg via INTRAVENOUS
  Filled 2014-06-17: qty 1

## 2014-06-17 MED ORDER — HYDROCODONE-ACETAMINOPHEN 5-325 MG PO TABS: 1.0000 | ORAL_TABLET | Freq: Four times a day (QID) | ORAL | Status: DC | PRN

## 2014-06-17 NOTE — Discharge Instructions (Signed)
Hypertension °Hypertension, commonly called high blood pressure, is when the force of blood pumping through your arteries is too strong. Your arteries are the blood vessels that carry blood from your heart throughout your body. A blood pressure reading consists of a higher number over a lower number, such as 110/72. The higher number (systolic) is the pressure inside your arteries when your heart pumps. The lower number (diastolic) is the pressure inside your arteries when your heart relaxes. Ideally you want your blood pressure below 120/80. °Hypertension forces your heart to work harder to pump blood. Your arteries may become narrow or stiff. Having hypertension puts you at risk for heart disease, stroke, and other problems.  °RISK FACTORS °Some risk factors for high blood pressure are controllable. Others are not.  °Risk factors you cannot control include:  °· Race. You may be at higher risk if you are African American. °· Age. Risk increases with age. °· Gender. Men are at higher risk than women before age 45 years. After age 65, women are at higher risk than men. °Risk factors you can control include: °· Not getting enough exercise or physical activity. °· Being overweight. °· Getting too much fat, sugar, calories, or salt in your diet. °· Drinking too much alcohol. °SIGNS AND SYMPTOMS °Hypertension does not usually cause signs or symptoms. Extremely high blood pressure (hypertensive crisis) may cause headache, anxiety, shortness of breath, and nosebleed. °DIAGNOSIS  °To check if you have hypertension, your health care provider will measure your blood pressure while you are seated, with your arm held at the level of your heart. It should be measured at least twice using the same arm. Certain conditions can cause a difference in blood pressure between your right and left arms. A blood pressure reading that is higher than normal on one occasion does not mean that you need treatment. If one blood pressure reading  is high, ask your health care provider about having it checked again. °TREATMENT  °Treating high blood pressure includes making lifestyle changes and possibly taking medicine. Living a healthy lifestyle can help lower high blood pressure. You may need to change some of your habits. °Lifestyle changes may include: °· Following the DASH diet. This diet is high in fruits, vegetables, and whole grains. It is low in salt, red meat, and added sugars. °· Getting at least 2½ hours of brisk physical activity every week. °· Losing weight if necessary. °· Not smoking. °· Limiting alcoholic beverages. °· Learning ways to reduce stress. ° If lifestyle changes are not enough to get your blood pressure under control, your health care provider may prescribe medicine. You may need to take more than one. Work closely with your health care provider to understand the risks and benefits. °HOME CARE INSTRUCTIONS °· Have your blood pressure rechecked as directed by your health care provider.   °· Take medicines only as directed by your health care provider. Follow the directions carefully. Blood pressure medicines must be taken as prescribed. The medicine does not work as well when you skip doses. Skipping doses also puts you at risk for problems.   °· Do not smoke.   °· Monitor your blood pressure at home as directed by your health care provider.  °SEEK MEDICAL CARE IF:  °· You think you are having a reaction to medicines taken. °· You have recurrent headaches or feel dizzy. °· You have swelling in your ankles. °· You have trouble with your vision. °SEEK IMMEDIATE MEDICAL CARE IF: °· You develop a severe headache or confusion. °·   You have unusual weakness, numbness, or feel faint.  You have severe chest or abdominal pain.  You vomit repeatedly.  You have trouble breathing. MAKE SURE YOU:   Understand these instructions.  Will watch your condition.  Will get help right away if you are not doing well or get worse. Document  Released: 09/04/2005 Document Revised: 01/19/2014 Document Reviewed: 06/27/2013 [facility] Patient Information 2015 [place], [place]. This information is not intended to replace advice given to you by your health care provider. Make sure you discuss any questions you have with your health care provider.     Diabetic Neuropathy Diabetic neuropathy is a nerve disease or nerve damage that is caused by diabetes mellitus. About half of all people with diabetes mellitus have some form of nerve damage. Nerve damage is more common in those who have had diabetes mellitus for many years and who generally have not had good control of their blood sugar (glucose) level. Diabetic neuropathy is a common complication of diabetes mellitus. There are three more common types of diabetic neuropathy and a fourth type that is less common and less understood:   Peripheral neuropathy--This is the most common type of diabetic neuropathy. It causes damage to the nerves of the feet and legs first and then eventually the hands and arms.The damage affects the ability to sense touch.  Autonomic neuropathy--This type causes damage to the autonomic nervous system, which controls the following functions:  Heartbeat.  Body temperature.  Blood pressure.  Urination.  Digestion.  Sweating.  Sexual function.  Focal neuropathy--Focal neuropathy can be painful and unpredictable and occurs most often in older adults with diabetes mellitus. It involves a specific nerve or one area and often comes on suddenly. It usually does not cause long-term problems.  Radiculoplexus neuropathy-- Sometimes called lumbosacral radiculoplexus neuropathy, radiculoplexus neuropathy affects the nerves of the thighs, hips, buttocks, or legs. It is more common in people with type 2 diabetes mellitus and in older men. It is characterized by debilitating pain, weakness, and atrophy, usually in the thigh muscles. CAUSES  The cause of peripheral,  autonomic, and focal neuropathies is diabetes mellitus that is uncontrolled and high glucose levels. The cause of radiculoplexus neuropathy is unknown. However, it is thought to be caused by inflammation related to uncontrolled glucose levels. SIGNS AND SYMPTOMS  Peripheral Neuropathy Peripheral neuropathy develops slowly over time. When the nerves of the feet and legs no longer work there may be:   Burning, stabbing, or aching pain in the legs or feet.  Inability to feel pressure or pain in your feet. This can lead to:  Thick calluses over pressure areas.  Pressure sores.  Ulcers.  Foot deformities.  Reduced ability to feel temperature changes.  Muscle weakness. Autonomic Neuropathy The symptoms of autonomic neuropathy vary depending on which nerves are affected. Symptoms may include:  Problems with digestion, such as:  Feeling sick to your stomach (nausea).  Vomiting.  Bloating.  Constipation.  Diarrhea.  Abdominal pain.  Difficulty with urination. This occurs if you lose your ability to sense when your bladder is full. Problems include:  Urine leakage (incontinence).  Inability to empty your bladder completely (retention).  Rapid or irregular heartbeat (palpitations).  Blood pressure drops when you stand up (orthostatic hypotension). When you stand up you may feel:  Dizzy.  Weak.  Faint.  In men, inability to attain and maintain an erection.  In women, vaginal dryness and problems with decreased sexual desire and arousal.  Problems with body temperature regulation.  Increased or decreased sweating. Focal Neuropathy  Abnormal eye movements or abnormal alignment of both eyes.  Weakness in the wrist.  Foot drop. This results in an inability to lift the foot properly and abnormal walking or foot movement.  Paralysis on one side of your face (Bell palsy).  Chest or abdominal pain. Radiculoplexus Neuropathy  Sudden, severe pain in your hip, thigh,  or buttocks.  Weakness and wasting of thigh muscles.  Difficulty rising from a seated position.  Abdominal swelling.  Unexplained weight loss (usually more than 10 lb [4.5 kg]). DIAGNOSIS  Peripheral Neuropathy Your senses may be tested. Sensory function testing can be done with:  A light touch using a monofilament.  A vibration with tuning fork.  A sharp sensation with a pin prick. Other tests that can help diagnose neuropathy are:  Nerve conduction velocity. This test checks the transmission of an electrical current through a nerve.  Electromyography. This shows how muscles respond to electrical signals transmitted by nearby nerves.  Quantitative sensory testing. This is used to assess how your nerves respond to vibrations and changes in temperature. Autonomic Neuropathy Diagnosis is often based on reported symptoms. Tell your health care provider if you experience:   Dizziness.   Constipation.   Diarrhea.   Inappropriate urination or inability to urinate.   Inability to get or maintain an erection.  Tests that may be done include:   Electrocardiography or Holter monitor. These are tests that can help show problems with the heart rate or heart rhythm.   An X-ray exam may be done. Focal Neuropathy Diagnosis is made based on your symptoms and what your health care provider finds during your exam. Other tests may be done. They may include:  Nerve conduction velocities. This checks the transmission of electrical current through a nerve.  Electromyography. This shows how muscles respond to electrical signals transmitted by nearby nerves.  Quantitative sensory testing. This test is used to assess how your nerves respond to vibration and changes in temperature. Radiculoplexus Neuropathy  Often the first thing is to eliminate any other issue or problems that might be the cause, as there is no stick test for diagnosis.  X-ray exam of your spine and lumbar  region.  Spinal tap to rule out cancer.  MRI to rule out other lesions. TREATMENT  Once nerve damage occurs, it cannot be reversed. The goal of treatment is to keep the disease or nerve damage from getting worse and affecting more nerve fibers. Controlling your blood glucose level is the key. Most people with radiculoplexus neuropathy see at least a partial improvement over time. You will need to keep your blood glucose and HbA1c levels in the target range determined by your health care provider. Things that help control blood glucose levels include:   Blood glucose monitoring.   Meal planning.   Physical activity.   Diabetes medicine.  Over time, maintaining lower blood glucose levels helps lessen symptoms. Sometimes, prescription pain medicine is needed. HOME CARE INSTRUCTIONS:  Do not smoke.  Keep your blood glucose level in the range that you and your health care provider have determined acceptable for you.  Keep your blood pressure level in the range that you and your health care provider have determined acceptable for you.  Eat a well-balanced diet.  Be active every day.  Check your feet every day. SEEK MEDICAL CARE IF:   You have burning, stabbing, or aching pain in the legs or feet.  You are unable to feel pressure  or pain in your feet.  You develop problems with digestion such as:  Nausea.  Vomiting.  Bloating.  Constipation.  Diarrhea.  Abdominal pain.  You have difficulty with urination, such as:  Incontinence.  Retention.  You have palpitations.  You develop orthostatic hypotension. When you stand up you may feel:  Dizzy.  Weak.  Faint.  You cannot attain and maintain an erection (in men).  You have vaginal dryness and problems with decreased sexual desire and arousal (in women).  You have severe pain in your thighs, legs, or buttocks.  You have unexplained weight loss. Document Released: 11/13/2001 Document Revised: 06/25/2013  Document Reviewed: 02/13/2013 [facility] Patient Information 2015 [place], [place]. This information is not intended to replace advice given to you by your health care provider. Make sure you discuss any questions you have with your health care provider.    Neuropathic Pain We often think that pain has a physical cause. If we get rid of the cause, the pain should go away. Nerves themselves can also cause pain. It is called neuropathic pain, which means nerve abnormality. It may be difficult for the patients who have it and for the treating caregivers. Pain is usually described as acute (short-lived) or chronic (long-lasting). Acute pain is related to the physical sensations caused by an injury. It can last from a few seconds to many weeks, but it usually goes away when normal healing occurs. Chronic pain lasts beyond the typical healing time. With neuropathic pain, the nerve fibers themselves may be damaged or injured. They then send incorrect signals to other pain centers. The pain you feel is real, but the cause is not easy to find.  CAUSES  Chronic pain can result from diseases, such as diabetes and shingles (an infection related to chickenpox), or from trauma, surgery, or amputation. It can also happen without any known injury or disease. The nerves are sending pain messages, even though there is no identifiable cause for such messages.   Other common causes of neuropathy include diabetes, phantom limb pain, or Regional Pain Syndrome (RPS).  As with all forms of chronic back pain, if neuropathy is not correctly treated, there can be a number of associated problems that lead to a downward cycle for the patient. These include depression, sleeplessness, feelings of fear and anxiety, limited social interaction and inability to do normal daily activities or work.  The most dramatic and mysterious example of neuropathic pain is called "phantom limb syndrome." This occurs when an arm or a leg has been  removed because of illness or injury. The brain still gets pain messages from the nerves that originally carried impulses from the missing limb. These nerves now seem to misfire and cause troubling pain.  Neuropathic pain often seems to have no cause. It responds poorly to standard pain treatment. Neuropathic pain can occur after:  Shingles (herpes zoster virus infection).  A lasting burning sensation of the skin, caused usually by injury to a peripheral nerve.  Peripheral neuropathy which is widespread nerve damage, often caused by diabetes or alcoholism.  Phantom limb pain following an amputation.  Facial nerve problems (trigeminal neuralgia).  Multiple sclerosis.  Reflex sympathetic dystrophy.  Pain which comes with cancer and cancer chemotherapy.  Entrapment neuropathy such as when pressure is put on a nerve such as in carpal tunnel syndrome.  Back, leg, and hip problems (sciatica).  Spine or back surgery.  HIV Infection or AIDS where nerves are infected by viruses. Your caregiver can explain items in  the above list which may apply to you. SYMPTOMS  Characteristics of neuropathic pain are:  Severe, sharp, electric shock-like, shooting, lightening-like, knife-like.  Pins and needles sensation.  Deep burning, deep cold, or deep ache.  Persistent numbness, tingling, or weakness.  Pain resulting from light touch or other stimulus that would not usually cause pain.  Increased sensitivity to something that would normally cause pain, such as a pinprick. Pain may persist for months or years following the healing of damaged tissues. When this happens, pain signals no longer sound an alarm about current injuries or injuries about to happen. Instead, the alarm system itself is not working correctly.  Neuropathic pain may get worse instead of better over time. For some people, it can lead to serious disability. It is important to be aware that severe injury in a limb can occur  without a proper, protective pain response.Burns, cuts, and other injuries may go unnoticed. Without proper treatment, these injuries can become infected or lead to further disability. Take any injury seriously, and consult your caregiver for treatment. DIAGNOSIS  When you have a pain with no known cause, your caregiver will probably ask some specific questions:   Do you have any other conditions, such as diabetes, shingles, multiple sclerosis, or HIV infection?  How would you describe your pain? (Neuropathic pain is often described as shooting, stabbing, burning, or searing.)  Is your pain worse at any time of the day? (Neuropathic pain is usually worse at night.)  Does the pain seem to follow a certain physical pathway?  Does the pain come from an area that has missing or injured nerves? (An example would be phantom limb pain.)  Is the pain triggered by minor things such as rubbing against the sheets at night? These questions often help define the type of pain involved. Once your caregiver knows what is happening, treatment can begin. Anticonvulsant, antidepressant drugs, and various pain relievers seem to work in some cases. If another condition, such as diabetes is involved, better management of that disorder may relieve the neuropathic pain.  TREATMENT  Neuropathic pain is frequently long-lasting and tends not to respond to treatment with narcotic type pain medication. It may respond well to other drugs such as antiseizure and antidepressant medications. Usually, neuropathic problems do not completely go away, but partial improvement is often possible with proper treatment. Your caregivers have large numbers of medications available to treat you. Do not be discouraged if you do not get immediate relief. Sometimes different medications or a combination of medications will be tried before you receive the results you are hoping for. See your caregiver if you have pain that seems to be coming from  nowhere and does not go away. Help is available.  SEEK IMMEDIATE MEDICAL CARE IF:   There is a sudden change in the quality of your pain, especially if the change is on only one side of the body.  You notice changes of the skin, such as redness, black or purple discoloration, swelling, or an ulcer.  You cannot move the affected limbs. Document Released: 06/01/2004 Document Revised: 11/27/2011 Document Reviewed: 06/01/2004 [facility] Patient Information 2015 [place], [place]. This information is not intended to replace advice given to you by your health care provider. Make sure you discuss any questions you have with your health care provider.

## 2014-06-17 NOTE — ED Notes (Signed)
Pt states that bystanders called 911 because the pt stated that she was feeling dizzy. Pt states that this happens to her occasionally, and states that it is usually due to her blood sugar being low. EMS reports that the pt's CBG was 187. Pt is endorsing bilateral thigh pain, stating that it feels like her thighs are going to explode. Pt denies chest pain and abd pain, however EMS reports that pt's abd feels tight. Pt also reports SOB.

## 2014-06-17 NOTE — ED Provider Notes (Signed)
CSN: [id number]     Arrival date & time 06/17/14  2011 History   First MD Initiated Contact with Patient 06/17/14 2014     Chief Complaint  Patient presents with  . Shortness of Breath  . Nausea  . Hypertension     (Consider location/radiation/quality/duration/timing/severity/associated sxs/prior Treatment) HPI 58 year old female presents with acute onset of lightheadedness and dizziness but felt like she's got a pass out. Her eyes were blurry and she was short of breath. Denies chest pain. No headaches. She did not fall. EMS noted her blood pressure to be very hypertensive. Her lightheadedness felt like it does when her sugar is low. She tried to eat peanut butter but did not resolve her symptoms. She denies a room spinning sensation. She's also complaining of back pain that she's had for several months and her right lower back. No midline tenderness or pain. No radiation of the pain. No urinary or bowel incontinence. Patient also endorses severe bilateral thigh pain. She states this is also been ongoing for months and has been attributed to diabetic neuropathy. She's currently on Neurontin. The patient states that the numbness in her legs not worse than normal. She also states her abdomen has been "tight" without pain. No constipation or vomiting.  Past Medical History  Diagnosis Date  . Diabetes mellitus 1999    T2DM  . H/O gestational diabetes mellitus, not currently pregnant   . Asthma   . Hyperlipidemia   . Hypertension   . Obesity   . OSA (obstructive sleep apnea)   . Glaucoma   . Achilles rupture   . Depression   . Arthritis   . Anxiety   . Heart murmur    Past Surgical History  Procedure Laterality Date  . Cholecystectomy  1991  . Cesarean section  12/10/89  . Breast lumpectomy      Right breast  . Tubal ligation    . Back surgery     History reviewed. No pertinent family history. History  Substance Use Topics  . Smoking status: Former Smoker    Types: Cigarettes   . Smokeless tobacco: Not on file  . Alcohol Use: Yes     Comment: occasional   OB History   Grav Para Term Preterm Abortions TAB SAB Ect Mult Living   1 1        1      Review of Systems  Constitutional: Negative for fever.  Eyes: Positive for visual disturbance.  Respiratory: Positive for shortness of breath.   Cardiovascular: Negative for chest pain and leg swelling.  Gastrointestinal: Positive for abdominal distention. Negative for nausea, vomiting and abdominal pain.  Musculoskeletal: Positive for myalgias.  Neurological: Positive for numbness. Negative for weakness and headaches.  All other systems reviewed and are negative.     Allergies  Eggs or egg-derived products; Milk-related compounds; and Pork-derived products  Home Medications   Prior to Admission medications   Medication Sig Start Date End Date Taking? Authorizing Provider  albuterol (PROVENTIL HFA;VENTOLIN HFA) 108 (90 BASE) MCG/ACT inhaler Inhale 2-3 puffs into the lungs every 6 (six) hours as needed for wheezing or shortness of breath. For shortness of breath 02/11/13  Yes Robyn Haber, MD  Ascorbic Acid (VITAMIN C) 1000 MG tablet Take 1,000 mg by mouth daily.    Yes Historical Provider, MD  B Complex-C (B-COMPLEX WITH VITAMIN C) tablet Take 1 tablet by mouth daily.   Yes Historical Provider, MD  BAYER CONTOUR TEST test strip 1 each by Other route  See admin instructions. Check blood sugar daily as needed for high blood sugar 06/03/14  Yes Historical Provider, MD  Cholecalciferol (VITAMIN D-3) 5000 UNITS TABS Take 5,000 Units by mouth daily.   Yes Historical Provider, MD  gabapentin (NEURONTIN) 300 MG capsule Take 1 capsule (300 mg total) by mouth daily. 04/08/14  Yes Noland Fordyce, PA-C  ibuprofen (ADVIL,MOTRIN) 200 MG tablet Take 800 mg by mouth every 6 (six) hours as needed for moderate pain.    Yes Historical Provider, MD  insulin aspart (NOVOLOG) 100 UNIT/ML injection Inject 5-7 Units into the skin 3 (three)  times daily with meals. Per sliding scale   Yes Historical Provider, MD  insulin glargine (LANTUS) 100 UNIT/ML injection Inject 40 Units into the skin at bedtime.    Yes Historical Provider, MD  metFORMIN (GLUCOPHAGE) 1000 MG tablet Take 1 tablet (1,000 mg total) by mouth 2 (two) times daily. PATIENT NEEDS OFFICE VISIT FOR ADDITIONAL REFILLS 02/16/14  Yes Robyn Haber, MD  [facility] 1-0.2 % SUSP Place 1 drop into both eyes 2 (two) times daily. 01/02/14  Yes Historical Provider, MD  travoprost, benzalkonium, (TRAVATAN) 0.004 % ophthalmic solution Place 1 drop into both eyes at bedtime.   Yes Historical Provider, MD  valsartan-hydrochlorothiazide (DIOVAN-HCT) 160-25 MG per tablet Take 1 tablet by mouth daily. 01/02/14  Yes Historical Provider, MD  HYDROcodone-acetaminophen (NORCO) 5-325 MG per tablet Take 1 tablet by mouth every 6 (six) hours as needed. 06/17/14   Ephraim Hamburger, MD   BP 103/80  Pulse 66  Temp(Src) 97.6 F (36.4 C) (Oral)  Resp 22  Ht 5\' 1"  (1.549 m)  Wt 237 lb (107.502 kg)  BMI 44.80 kg/m2  SpO2 100% Physical Exam  Nursing note and vitals reviewed. Constitutional: She is oriented to person, place, and time. She appears well-developed and well-nourished. No distress.  obese  HENT:  Head: Normocephalic and atraumatic.  Right Ear: External ear normal.  Left Ear: External ear normal.  Nose: Nose normal.  Eyes: EOM are normal. Pupils are equal, round, and reactive to light. Right eye exhibits no discharge. Left eye exhibits no discharge.  Cardiovascular: Normal rate, regular rhythm and normal heart sounds.   Pulmonary/Chest: Effort normal and breath sounds normal. She has no wheezes. She has no rales.  Abdominal: Soft. She exhibits no distension. There is no tenderness.  Musculoskeletal:  Bilateral thighs soft with mild tenderness. No crepitus or full compartments  Neurological: She is alert and oriented to person, place, and time.  CN 2-12 grossly intact. 5/5 strength in  all 4 extremities. Normal finger to nose  Skin: Skin is warm and dry.    ED Course  Procedures (including critical care time) Labs Review Labs Reviewed  CBC WITH DIFFERENTIAL - Abnormal; Notable for the following:    Hemoglobin 11.0 (*)    HCT 34.6 (*)    MCH 25.4 (*)    All other components within normal limits  COMPREHENSIVE METABOLIC PANEL - Abnormal; Notable for the following:    Glucose, Bld 265 (*)    Albumin 3.2 (*)    Total Bilirubin <0.2 (*)    GFR calc non Af Amer 59 (*)    GFR calc Af Amer 69 (*)    All other components within normal limits  URINALYSIS, ROUTINE W REFLEX MICROSCOPIC - Abnormal; Notable for the following:    Protein, ur 30 (*)    All other components within normal limits  URINE MICROSCOPIC-ADD ON - Abnormal; Notable for the following:  Squamous Epithelial / LPF MANY (*)    All other components within normal limits  PRO B NATRIURETIC PEPTIDE  I-STAT TROPOININ, ED    Imaging Review Dg Abd Acute W/chest  06/17/2014   CLINICAL DATA:  Dizziness, syncope, shortness of breath  EXAM: ACUTE ABDOMEN SERIES (ABDOMEN 2 VIEW & CHEST 1 VIEW)  COMPARISON:  01/13/2014  FINDINGS: Cardiomegaly with pulmonary vascular congestion. Mild interstitial edema is possible. Mild right basilar opacity, likely atelectasis. No pleural effusion or pneumothorax.  Nonobstructive bowel gas pattern.  No evidence of free air under the diaphragm on the upright view.  Cholecystectomy clips.  Degenerative changes of the lumbar spine.  IMPRESSION: Cardiomegaly with pulmonary vascular congestion and possible mild interstitial edema.  Mild right basilar opacity, likely atelectasis.  No evidence of small bowel obstruction or free air.   Electronically Signed   By: Julian Hy M.D.   On: 06/17/2014 22:50     EKG Interpretation   Date/Time:  Wednesday June 17 2014 20:26:17 EDT Ventricular Rate:  91 PR Interval:  126 QRS Duration: 82 QT Interval:  389 QTC Calculation: 479 R Axis:    -13 Text Interpretation:  Sinus rhythm Paired ventricular premature complexes  RSR' in V1 or V2, right VCD or RVH otherwise no signisficant change since  2014 Confirmed by Mallie Giambra  MD, [place] ([id number]) on 06/17/2014 10:07:15 PM      MDM   Final diagnoses:  Hypertensive urgency    Believe the patient's symptoms are most consistent with her elevated blood pressure. When she initially came to the ED her systolic blood pressure was over 200. By the time I evaluated her her blood pressure I can not do 170. He then proceeded to come down with pain control and observation. Her blurry vision resolved prior to me seeing her. I believe her dizziness, blurry vision, and transient shortness of breath can all be explained from an acutely elevated blood pressure. This consistent with hypertensive urgency but there is no sign of stroke or strokelike symptoms, ACS, or other end organ damage. Her blood pressure is now into the 150s I feel she is stable for discharge with close followup with her PCP for better blood pressure control.    Ephraim Hamburger, MD 06/17/14 [phone]

## 2014-06-18 NOTE — ED Notes (Signed)
Discharge instructions reviewed with pt. Pt verbalized understanding.   

## 2014-06-23 ENCOUNTER — Emergency Department (INDEPENDENT_AMBULATORY_CARE_PROVIDER_SITE_OTHER)
Admission: EM | Admit: 2014-06-23 | Discharge: 2014-06-23 | Disposition: A | Discharge status: Nursing Home (Includes ICF) | Payer: BC Managed Care – PPO | Source: Home / Self Care | Attending: Family Medicine | Admitting: Family Medicine

## 2014-06-23 ENCOUNTER — Emergency Department (HOSPITAL_COMMUNITY)
Admission: EM | Admit: 2014-06-23 | Discharge: 2014-06-24 | Disposition: A | Discharge status: Home / Self Care | Payer: BC Managed Care – PPO | Attending: Emergency Medicine | Admitting: Emergency Medicine

## 2014-06-23 ENCOUNTER — Encounter (HOSPITAL_COMMUNITY): Payer: Self-pay | Admitting: Emergency Medicine

## 2014-06-23 DIAGNOSIS — R011 Cardiac murmur, unspecified: Secondary | ICD-10-CM | POA: Insufficient documentation

## 2014-06-23 DIAGNOSIS — J45909 Unspecified asthma, uncomplicated: Secondary | ICD-10-CM | POA: Diagnosis not present

## 2014-06-23 DIAGNOSIS — Z79899 Other long term (current) drug therapy: Secondary | ICD-10-CM | POA: Insufficient documentation

## 2014-06-23 DIAGNOSIS — M1990 Unspecified osteoarthritis, unspecified site: Secondary | ICD-10-CM | POA: Insufficient documentation

## 2014-06-23 DIAGNOSIS — Z794 Long term (current) use of insulin: Secondary | ICD-10-CM | POA: Insufficient documentation

## 2014-06-23 DIAGNOSIS — M79669 Pain in unspecified lower leg: Secondary | ICD-10-CM | POA: Diagnosis present

## 2014-06-23 DIAGNOSIS — Z87891 Personal history of nicotine dependence: Secondary | ICD-10-CM | POA: Insufficient documentation

## 2014-06-23 DIAGNOSIS — G8929 Other chronic pain: Secondary | ICD-10-CM | POA: Diagnosis not present

## 2014-06-23 DIAGNOSIS — F329 Major depressive disorder, single episode, unspecified: Secondary | ICD-10-CM | POA: Diagnosis not present

## 2014-06-23 DIAGNOSIS — F419 Anxiety disorder, unspecified: Secondary | ICD-10-CM | POA: Diagnosis not present

## 2014-06-23 DIAGNOSIS — I10 Essential (primary) hypertension: Secondary | ICD-10-CM | POA: Insufficient documentation

## 2014-06-23 DIAGNOSIS — Z8669 Personal history of other diseases of the nervous system and sense organs: Secondary | ICD-10-CM | POA: Insufficient documentation

## 2014-06-23 DIAGNOSIS — E669 Obesity, unspecified: Secondary | ICD-10-CM | POA: Diagnosis not present

## 2014-06-23 DIAGNOSIS — H409 Unspecified glaucoma: Secondary | ICD-10-CM | POA: Diagnosis not present

## 2014-06-23 DIAGNOSIS — E1340 Other specified diabetes mellitus with diabetic neuropathy, unspecified: Secondary | ICD-10-CM | POA: Diagnosis not present

## 2014-06-23 DIAGNOSIS — Z87828 Personal history of other (healed) physical injury and trauma: Secondary | ICD-10-CM | POA: Insufficient documentation

## 2014-06-23 DIAGNOSIS — E785 Hyperlipidemia, unspecified: Secondary | ICD-10-CM | POA: Diagnosis not present

## 2014-06-23 DIAGNOSIS — G894 Chronic pain syndrome: Secondary | ICD-10-CM

## 2014-06-23 HISTORY — DX: Pain in leg, unspecified: M79.606

## 2014-06-23 HISTORY — DX: Dorsalgia, unspecified: M54.9

## 2014-06-23 HISTORY — DX: Headache, unspecified: R51.9

## 2014-06-23 HISTORY — DX: Headache: R51

## 2014-06-23 HISTORY — DX: Sciatica, unspecified side: M54.30

## 2014-06-23 HISTORY — DX: Other chronic pain: G89.29

## 2014-06-23 NOTE — ED Provider Notes (Signed)
CSN: [id number]     Arrival date & time 06/23/14  1654 History   First MD Initiated Contact with Patient 06/23/14 1738     Chief Complaint  Patient presents with  . Generalized Body Aches    "hurts all over"   (Consider location/radiation/quality/duration/timing/severity/associated sxs/prior Treatment) Patient is a 58 y.o. female presenting with extremity pain. The history is provided by the patient.  Extremity Pain This is a chronic problem. The current episode started more than 1 week ago (pain with diabetic chronic pain, seen in ER 9/30, seen , still with  pain.). The problem has been gradually worsening. Associated symptoms include headaches. Pertinent negatives include no chest pain and no abdominal pain. Associated symptoms comments: Generalized pain sx.. The symptoms are aggravated by walking. Nothing relieves the symptoms.    Past Medical History  Diagnosis Date  . Diabetes mellitus 1999    T2DM  . H/O gestational diabetes mellitus, not currently pregnant   . Asthma   . Hyperlipidemia   . Hypertension   . Obesity   . OSA (obstructive sleep apnea)   . Glaucoma   . Achilles rupture   . Depression   . Arthritis   . Anxiety   . Heart murmur    Past Surgical History  Procedure Laterality Date  . Cholecystectomy  1991  . Cesarean section  12/10/89  . Breast lumpectomy      Right breast  . Tubal ligation    . Back surgery     History reviewed. No pertinent family history. History  Substance Use Topics  . Smoking status: Former Smoker    Types: Cigarettes  . Smokeless tobacco: Not on file  . Alcohol Use: Yes     Comment: occasional   OB History   Grav Para Term Preterm Abortions TAB SAB Ect Mult Living   1 1        1      Review of Systems  Cardiovascular: Negative for chest pain.  Gastrointestinal: Negative for abdominal pain.  Genitourinary: Negative.   Musculoskeletal: Positive for arthralgias and myalgias.  Neurological: Positive for dizziness, weakness,  light-headedness and headaches.    Allergies  Eggs or egg-derived products; Milk-related compounds; and Pork-derived products  Home Medications   Prior to Admission medications   Medication Sig Start Date End Date Taking? Authorizing Provider  Ascorbic Acid (VITAMIN C) 1000 MG tablet Take 1,000 mg by mouth daily.    Yes Historical Provider, MD  B Complex-C (B-COMPLEX WITH VITAMIN C) tablet Take 1 tablet by mouth daily.   Yes Historical Provider, MD  Cholecalciferol (VITAMIN D-3) 5000 UNITS TABS Take 5,000 Units by mouth daily.   Yes Historical Provider, MD  gabapentin (NEURONTIN) 300 MG capsule Take 1 capsule (300 mg total) by mouth daily. 04/08/14  Yes Noland Fordyce, PA-C  ibuprofen (ADVIL,MOTRIN) 200 MG tablet Take 800 mg by mouth every 6 (six) hours as needed for moderate pain.    Yes Historical Provider, MD  insulin aspart (NOVOLOG) 100 UNIT/ML injection Inject 5-7 Units into the skin 3 (three) times daily with meals. Per sliding scale   Yes Historical Provider, MD  insulin glargine (LANTUS) 100 UNIT/ML injection Inject 40 Units into the skin at bedtime.    Yes Historical Provider, MD  metFORMIN (GLUCOPHAGE) 1000 MG tablet Take 1 tablet (1,000 mg total) by mouth 2 (two) times daily. PATIENT NEEDS OFFICE VISIT FOR ADDITIONAL REFILLS 02/16/14  Yes Robyn Haber, MD  [facility] 1-0.2 % SUSP Place 1 drop into both eyes  2 (two) times daily. 01/02/14  Yes Historical Provider, MD  travoprost, benzalkonium, (TRAVATAN) 0.004 % ophthalmic solution Place 1 drop into both eyes at bedtime.   Yes Historical Provider, MD  valsartan-hydrochlorothiazide (DIOVAN-HCT) 160-25 MG per tablet Take 1 tablet by mouth daily. 01/02/14  Yes Historical Provider, MD  albuterol (PROVENTIL HFA;VENTOLIN HFA) 108 (90 BASE) MCG/ACT inhaler Inhale 2-3 puffs into the lungs every 6 (six) hours as needed for wheezing or shortness of breath. For shortness of breath 02/11/13   Robyn Haber, MD  BAYER CONTOUR TEST test strip 1 each  by Other route See admin instructions. Check blood sugar daily as needed for high blood sugar 06/03/14   Historical Provider, MD  HYDROcodone-acetaminophen (NORCO) 5-325 MG per tablet Take 1 tablet by mouth every 6 (six) hours as needed. 06/17/14   Ephraim Hamburger, MD   BP 168/82  Pulse 103  Temp(Src) 98.2 F (36.8 C) (Oral)  Resp 16  SpO2 100% Physical Exam  Nursing note and vitals reviewed. Constitutional: She is oriented to person, place, and time. She appears well-developed and well-nourished. She appears distressed.  Neck: Normal range of motion. Neck supple.  Cardiovascular: Normal heart sounds.   Pulmonary/Chest: Effort normal.  Musculoskeletal: She exhibits edema and tenderness.  Neurological: She is alert and oriented to person, place, and time.  Skin: Skin is warm and dry.    ED Course  Procedures (including critical care time) Labs Review Labs Reviewed - No data to display  Imaging Review No results found.   MDM   1. Chronic pain disorder       Billy Fischer, MD 06/23/14 [phone]

## 2014-06-23 NOTE — ED Notes (Signed)
Pt here for high bp and states both of her legs are hurting but the right is worse than the left. States they both feel numb. Has trouble walking. Not sure what is going on.

## 2014-06-23 NOTE — ED Notes (Signed)
C/o  Hurting all over.  Tightness in legs.  Blurred vision while sitting in front of computer.  X 1 month.   No relief with otc meds.

## 2014-06-24 MED ORDER — OXYCODONE-ACETAMINOPHEN 5-325 MG PO TABS: 1.0000 | ORAL_TABLET | Freq: Four times a day (QID) | ORAL | Status: DC | PRN

## 2014-06-24 MED ORDER — ONDANSETRON 4 MG PO TBDP
4.0000 mg | ORAL_TABLET | Freq: Once | ORAL | Status: AC
  Administered 2014-06-24: 4 mg via ORAL

## 2014-06-24 MED ORDER — PREGABALIN 75 MG PO CAPS
75.0000 mg | ORAL_CAPSULE | Freq: Once | ORAL | Status: AC
  Administered 2014-06-24: 75 mg via ORAL
  Filled 2014-06-24: qty 1

## 2014-06-24 MED ORDER — HYDROMORPHONE HCL 1 MG/ML IJ SOLN
1.0000 mg | Freq: Once | INTRAMUSCULAR | Status: AC
  Administered 2014-06-24: 1 mg via INTRAMUSCULAR
  Filled 2014-06-24: qty 1

## 2014-06-24 MED ORDER — PREGABALIN 75 MG PO CAPS: 75.0000 mg | ORAL_CAPSULE | Freq: Every day | ORAL | Status: DC

## 2014-06-24 NOTE — ED Provider Notes (Signed)
TIME SEEN: 12:43 AM  CHIEF COMPLAINT: Bilateral lower extremity burning and numbness for several months  HPI: Patient is a 58 year old female with a history of non-insulin-dependent diabetes, hypertension, hyperlipidemia, depression, chronic headaches and back pain, chronic lower extremity pain who presents to the emergency department with complaints of burning i pain and numbness has been present for several months but worse over the past few weeks. She states that because of the pain she is having difficulty walking. Denies any back pain. No urinary retention or overflow incontinence. No bowel incontinence. No fevers. No history of injury to her legs. She states that she was seen in urgent care today and they transferred her to the emergency department for further evaluation. She reports that when the pain becomes bad her blood pressure becomes elevated and she has blurry vision. No blurry vision or vision loss currently. No chest pain or shortness of breath. No headache. No other numbness that is new for her focal weakness.  ROS: See HPI Constitutional: no fever  Eyes: no drainage  ENT: no runny nose   Cardiovascular:  no chest pain  Resp: no SOB  GI: no vomiting GU: no dysuria Integumentary: no rash  Allergy: no hives  Musculoskeletal: no leg swelling  Neurological: no slurred speech ROS otherwise negative  PAST MEDICAL HISTORY/PAST SURGICAL HISTORY:  Past Medical History  Diagnosis Date  . Diabetes mellitus 1999    T2DM  . H/O gestational diabetes mellitus, not currently pregnant   . Asthma   . Hyperlipidemia   . Hypertension   . Obesity   . OSA (obstructive sleep apnea)   . Glaucoma   . Achilles rupture   . Depression   . Arthritis   . Anxiety   . Heart murmur   . Chronic headaches   . Chronic back pain   . Sciatica   . Chronic leg pain     bilaterally    MEDICATIONS:  Prior to Admission medications   Medication Sig Start Date End Date Taking? Authorizing Provider   albuterol (PROVENTIL HFA;VENTOLIN HFA) 108 (90 BASE) MCG/ACT inhaler Inhale 2-3 puffs into the lungs every 6 (six) hours as needed for wheezing or shortness of breath. For shortness of breath 02/11/13  Yes Robyn Haber, MD  Ascorbic Acid (VITAMIN C) 1000 MG tablet Take 1,000 mg by mouth daily.    Yes Historical Provider, MD  B Complex-C (B-COMPLEX WITH VITAMIN C) tablet Take 1 tablet by mouth daily.   Yes Historical Provider, MD  Cholecalciferol (VITAMIN D-3) 5000 UNITS TABS Take 5,000 Units by mouth daily.   Yes Historical Provider, MD  gabapentin (NEURONTIN) 300 MG capsule Take 1 capsule (300 mg total) by mouth daily. 04/08/14  Yes Noland Fordyce, PA-C  HYDROcodone-acetaminophen (NORCO/VICODIN) 5-325 MG per tablet Take 1 tablet by mouth every 6 (six) hours as needed for moderate pain. 06/17/14  Yes Ephraim Hamburger, MD  ibuprofen (ADVIL,MOTRIN) 200 MG tablet Take 800 mg by mouth every 6 (six) hours as needed for moderate pain.    Yes Historical Provider, MD  insulin aspart (NOVOLOG) 100 UNIT/ML injection Inject 5-7 Units into the skin 3 (three) times daily with meals. Per sliding scale   Yes Historical Provider, MD  insulin glargine (LANTUS) 100 UNIT/ML injection Inject 40 Units into the skin at bedtime.    Yes Historical Provider, MD  metFORMIN (GLUCOPHAGE) 1000 MG tablet Take 1 tablet (1,000 mg total) by mouth 2 (two) times daily. PATIENT NEEDS OFFICE VISIT FOR ADDITIONAL REFILLS 02/16/14  Yes  Robyn Haber, MD  SIMBRINZA 1-0.2 % SUSP Place 1 drop into both eyes 2 (two) times daily. 01/02/14  Yes Historical Provider, MD  travoprost, benzalkonium, (TRAVATAN) 0.004 % ophthalmic solution Place 1 drop into both eyes at bedtime.   Yes Historical Provider, MD  valsartan-hydrochlorothiazide (DIOVAN-HCT) 160-25 MG per tablet Take 1 tablet by mouth daily. 01/02/14  Yes Historical Provider, MD  BAYER CONTOUR TEST test strip 1 each by Other route See admin instructions. Check blood sugar daily as needed for  high blood sugar 06/03/14   Historical Provider, MD    ALLERGIES:  Allergies  Allergen Reactions  . Eggs Or Egg-Derived Products Nausea And Vomiting  . Milk-Related Compounds Nausea And Vomiting  . Pork-Derived Products Nausea And Vomiting    SOCIAL HISTORY:  History  Substance Use Topics  . Smoking status: Former Smoker    Types: Cigarettes  . Smokeless tobacco: Not on file  . Alcohol Use: Yes     Comment: occasional    FAMILY HISTORY: No family history on file.  EXAM: BP 173/59  Pulse 59  Temp(Src) 98.3 F (36.8 C) (Oral)  Resp 16  Ht 5\' 1"  (1.549 m)  Wt 236 lb (107.049 kg)  BMI 44.61 kg/m2  SpO2 100% CONSTITUTIONAL: Alert and oriented and responds appropriately to questions. Well-appearing; well-nourished, tearful but in no acute distress HEAD: Normocephalic EYES: Conjunctivae clear, PERRL ENT: normal nose; no rhinorrhea; moist mucous membranes; pharynx without lesions noted NECK: Supple, no meningismus, no LAD  CARD: RRR; S1 and S2 appreciated; no murmurs, no clicks, no rubs, no gallops RESP: Normal chest excursion without splinting or tachypnea; breath sounds clear and equal bilaterally; no wheezes, no rhonchi, no rales,  ABD/GI: Normal bowel sounds; non-distended; soft, non-tender, no rebound, no guarding BACK:  The back appears normal and is non-tender to palpation, there is no CVA tenderness EXT: Normal ROM in all joints; non-tender to palpation; no edema; normal capillary refill; no cyanosis    SKIN: Normal color for age and race; warm NEURO: Moves all extremities equally; sensation to light touch intact diffusely, patient is able to ambulate with a cane which is her baseline, cranial nerves II through XII intact PSYCH: The patient's mood and manner are appropriate. Grooming and personal hygiene are appropriate.  MEDICAL DECISION MAKING: Patient here with exacerbation of her chronic pain from her neuropathy. She has no back pain or focal neurologic deficit.  Overflow incontinence or bowel incontinence. I feel that this pain is likely secondary to neuropathy from her diabetes. There is no sign of cellulitis on exam. No calf tenderness or swelling to suggest a DVT. We'll give pain medication and we'll and reassess. She is are you on gabapentin. Have discussed with patient that given this is been going on for several months she needs to talk to her primary care physician for further management for this disorder.  ED PROGRESS: Patient's pain is controlled after one IM dose of Dilaudid and Lyrica. She is able to ambulate with her cane with minimal assistance when I am in the room with her. I feel she is safe to be discharged home. We'll discharge with a small course of Percocet and start her on Lyrica. Have again discussed with patient she needs to followup with her primary care physician for further management for this pain. Discussed return precautions. She verbalized understanding and is comfortable with plan.     [place], DO 06/24/14 [phone]

## 2014-06-24 NOTE — Discharge Instructions (Signed)
Diabetic Neuropathy Diabetic neuropathy is a nerve disease or nerve damage that is caused by diabetes mellitus. About half of all people with diabetes mellitus have some form of nerve damage. Nerve damage is more common in those who have had diabetes mellitus for many years and who generally have not had good control of their blood sugar (glucose) level. Diabetic neuropathy is a common complication of diabetes mellitus. There are three more common types of diabetic neuropathy and a fourth type that is less common and less understood:   Peripheral neuropathy--This is the most common type of diabetic neuropathy. It causes damage to the nerves of the feet and legs first and then eventually the hands and arms.The damage affects the ability to sense touch.  Autonomic neuropathy--This type causes damage to the autonomic nervous system, which controls the following functions:  Heartbeat.  Body temperature.  Blood pressure.  Urination.  Digestion.  Sweating.  Sexual function.  Focal neuropathy--Focal neuropathy can be painful and unpredictable and occurs most often in older adults with diabetes mellitus. It involves a specific nerve or one area and often comes on suddenly. It usually does not cause long-term problems.  Radiculoplexus neuropathy-- Sometimes called lumbosacral radiculoplexus neuropathy, radiculoplexus neuropathy affects the nerves of the thighs, hips, buttocks, or legs. It is more common in people with type 2 diabetes mellitus and in older men. It is characterized by debilitating pain, weakness, and atrophy, usually in the thigh muscles. CAUSES  The cause of peripheral, autonomic, and focal neuropathies is diabetes mellitus that is uncontrolled and high glucose levels. The cause of radiculoplexus neuropathy is unknown. However, it is thought to be caused by inflammation related to uncontrolled glucose levels. SIGNS AND SYMPTOMS  Peripheral Neuropathy Peripheral neuropathy develops  slowly over time. When the nerves of the feet and legs no longer work there may be:   Burning, stabbing, or aching pain in the legs or feet.  Inability to feel pressure or pain in your feet. This can lead to:  Thick calluses over pressure areas.  Pressure sores.  Ulcers.  Foot deformities.  Reduced ability to feel temperature changes.  Muscle weakness. Autonomic Neuropathy The symptoms of autonomic neuropathy vary depending on which nerves are affected. Symptoms may include:  Problems with digestion, such as:  Feeling sick to your stomach (nausea).  Vomiting.  Bloating.  Constipation.  Diarrhea.  Abdominal pain.  Difficulty with urination. This occurs if you lose your ability to sense when your bladder is full. Problems include:  Urine leakage (incontinence).  Inability to empty your bladder completely (retention).  Rapid or irregular heartbeat (palpitations).  Blood pressure drops when you stand up (orthostatic hypotension). When you stand up you may feel:  Dizzy.  Weak.  Faint.  In men, inability to attain and maintain an erection.  In women, vaginal dryness and problems with decreased sexual desire and arousal.  Problems with body temperature regulation.  Increased or decreased sweating. Focal Neuropathy  Abnormal eye movements or abnormal alignment of both eyes.  Weakness in the wrist.  Foot drop. This results in an inability to lift the foot properly and abnormal walking or foot movement.  Paralysis on one side of your face (Bell palsy).  Chest or abdominal pain. Radiculoplexus Neuropathy  Sudden, severe pain in your hip, thigh, or buttocks.  Weakness and wasting of thigh muscles.  Difficulty rising from a seated position.  Abdominal swelling.  Unexplained weight loss (usually more than 10 lb [4.5 kg]). DIAGNOSIS  Peripheral Neuropathy Your senses may   be tested. Sensory function testing can be done with:  A light touch using a  monofilament.  A vibration with tuning fork.  A sharp sensation with a pin prick. Other tests that can help diagnose neuropathy are:  Nerve conduction velocity. This test checks the transmission of an electrical current through a nerve.  Electromyography. This shows how muscles respond to electrical signals transmitted by nearby nerves.  Quantitative sensory testing. This is used to assess how your nerves respond to vibrations and changes in temperature. Autonomic Neuropathy Diagnosis is often based on reported symptoms. Tell your health care provider if you experience:   Dizziness.   Constipation.   Diarrhea.   Inappropriate urination or inability to urinate.   Inability to get or maintain an erection.  Tests that may be done include:   Electrocardiography or Holter monitor. These are tests that can help show problems with the heart rate or heart rhythm.   An X-ray exam may be done. Focal Neuropathy Diagnosis is made based on your symptoms and what your health care provider finds during your exam. Other tests may be done. They may include:  Nerve conduction velocities. This checks the transmission of electrical current through a nerve.  Electromyography. This shows how muscles respond to electrical signals transmitted by nearby nerves.  Quantitative sensory testing. This test is used to assess how your nerves respond to vibration and changes in temperature. Radiculoplexus Neuropathy  Often the first thing is to eliminate any other issue or problems that might be the cause, as there is no stick test for diagnosis.  X-ray exam of your spine and lumbar region.  Spinal tap to rule out cancer.  MRI to rule out other lesions. TREATMENT  Once nerve damage occurs, it cannot be reversed. The goal of treatment is to keep the disease or nerve damage from getting worse and affecting more nerve fibers. Controlling your blood glucose level is the key. Most people with  radiculoplexus neuropathy see at least a partial improvement over time. You will need to keep your blood glucose and HbA1c levels in the target range determined by your health care provider. Things that help control blood glucose levels include:   Blood glucose monitoring.   Meal planning.   Physical activity.   Diabetes medicine.  Over time, maintaining lower blood glucose levels helps lessen symptoms. Sometimes, prescription pain medicine is needed. HOME CARE INSTRUCTIONS:  Do not smoke.  Keep your blood glucose level in the range that you and your health care provider have determined acceptable for you.  Keep your blood pressure level in the range that you and your health care provider have determined acceptable for you.  Eat a well-balanced diet.  Be active every day.  Check your feet every day. SEEK MEDICAL CARE IF:   You have burning, stabbing, or aching pain in the legs or feet.  You are unable to feel pressure or pain in your feet.  You develop problems with digestion such as:  Nausea.  Vomiting.  Bloating.  Constipation.  Diarrhea.  Abdominal pain.  You have difficulty with urination, such as:  Incontinence.  Retention.  You have palpitations.  You develop orthostatic hypotension. When you stand up you may feel:  Dizzy.  Weak.  Faint.  You cannot attain and maintain an erection (in men).  You have vaginal dryness and problems with decreased sexual desire and arousal (in women).  You have severe pain in your thighs, legs, or buttocks.  You have unexplained weight loss.   Document Released: 11/13/2001 Document Revised: 06/25/2013 Document Reviewed: 02/13/2013 [employer] Patient Information 2015 [employer]. This information is not intended to replace advice given to you by your health care provider. Make sure you discuss any questions you have with your health care provider. 

## 2014-06-24 NOTE — ED Notes (Signed)
Gave pt a turkey sandwich and a ginger ale 

## 2014-06-24 NOTE — ED Notes (Signed)
MD at bedside. 

## 2014-06-25 ENCOUNTER — Other Ambulatory Visit: Payer: Self-pay | Admitting: Internal Medicine

## 2014-06-25 DIAGNOSIS — M5416 Radiculopathy, lumbar region: Secondary | ICD-10-CM

## 2014-06-30 ENCOUNTER — Ambulatory Visit
Admission: RE | Admit: 2014-06-30 | Discharge: 2014-06-30 | Disposition: A | Discharge status: Home / Self Care | Payer: BC Managed Care – PPO | Source: Ambulatory Visit | Attending: Internal Medicine | Admitting: Internal Medicine

## 2014-06-30 DIAGNOSIS — M5416 Radiculopathy, lumbar region: Secondary | ICD-10-CM

## 2014-07-20 ENCOUNTER — Encounter (HOSPITAL_COMMUNITY): Payer: Self-pay | Admitting: Emergency Medicine

## 2014-10-10 DIAGNOSIS — H401133 Primary open-angle glaucoma, bilateral, severe stage: Secondary | ICD-10-CM | POA: Insufficient documentation

## 2014-11-11 ENCOUNTER — Emergency Department (HOSPITAL_COMMUNITY): Payer: BLUE CROSS/BLUE SHIELD

## 2014-11-11 ENCOUNTER — Encounter (HOSPITAL_COMMUNITY): Payer: Self-pay | Admitting: *Deleted

## 2014-11-11 ENCOUNTER — Emergency Department (HOSPITAL_COMMUNITY)
Admission: EM | Admit: 2014-11-11 | Discharge: 2014-11-11 | Disposition: A | Discharge status: Home / Self Care | Payer: BLUE CROSS/BLUE SHIELD | Attending: Emergency Medicine | Admitting: Emergency Medicine

## 2014-11-11 DIAGNOSIS — Z9049 Acquired absence of other specified parts of digestive tract: Secondary | ICD-10-CM | POA: Diagnosis not present

## 2014-11-11 DIAGNOSIS — F329 Major depressive disorder, single episode, unspecified: Secondary | ICD-10-CM | POA: Diagnosis not present

## 2014-11-11 DIAGNOSIS — Z79899 Other long term (current) drug therapy: Secondary | ICD-10-CM | POA: Insufficient documentation

## 2014-11-11 DIAGNOSIS — R1031 Right lower quadrant pain: Secondary | ICD-10-CM | POA: Insufficient documentation

## 2014-11-11 DIAGNOSIS — F419 Anxiety disorder, unspecified: Secondary | ICD-10-CM | POA: Insufficient documentation

## 2014-11-11 DIAGNOSIS — G8929 Other chronic pain: Secondary | ICD-10-CM | POA: Insufficient documentation

## 2014-11-11 DIAGNOSIS — E119 Type 2 diabetes mellitus without complications: Secondary | ICD-10-CM | POA: Insufficient documentation

## 2014-11-11 DIAGNOSIS — K5900 Constipation, unspecified: Secondary | ICD-10-CM

## 2014-11-11 DIAGNOSIS — M549 Dorsalgia, unspecified: Secondary | ICD-10-CM | POA: Diagnosis not present

## 2014-11-11 DIAGNOSIS — E669 Obesity, unspecified: Secondary | ICD-10-CM | POA: Diagnosis not present

## 2014-11-11 DIAGNOSIS — Z9851 Tubal ligation status: Secondary | ICD-10-CM | POA: Insufficient documentation

## 2014-11-11 DIAGNOSIS — M1990 Unspecified osteoarthritis, unspecified site: Secondary | ICD-10-CM | POA: Diagnosis not present

## 2014-11-11 DIAGNOSIS — Z87891 Personal history of nicotine dependence: Secondary | ICD-10-CM | POA: Diagnosis not present

## 2014-11-11 DIAGNOSIS — Z794 Long term (current) use of insulin: Secondary | ICD-10-CM | POA: Insufficient documentation

## 2014-11-11 DIAGNOSIS — R011 Cardiac murmur, unspecified: Secondary | ICD-10-CM | POA: Diagnosis not present

## 2014-11-11 DIAGNOSIS — I10 Essential (primary) hypertension: Secondary | ICD-10-CM | POA: Insufficient documentation

## 2014-11-11 LAB — URINE MICROSCOPIC-ADD ON

## 2014-11-11 LAB — COMPREHENSIVE METABOLIC PANEL
ALT: 13 U/L (ref 0–35)
ANION GAP: 12 (ref 5–15)
AST: 19 U/L (ref 0–37)
Albumin: 4.1 g/dL (ref 3.5–5.2)
Alkaline Phosphatase: 45 U/L (ref 39–117)
BUN: 15 mg/dL (ref 6–23)
CO2: 23 mmol/L (ref 19–32)
CREATININE: 1.02 mg/dL (ref 0.50–1.10)
Calcium: 9.5 mg/dL (ref 8.4–10.5)
Chloride: 105 mmol/L (ref 96–112)
GFR calc Af Amer: 69 mL/min — ABNORMAL LOW (ref >90)
GFR calc non Af Amer: 60 mL/min — ABNORMAL LOW (ref >90)
Glucose, Bld: 223 mg/dL — ABNORMAL HIGH (ref 70–99)
Potassium: 4 mmol/L (ref 3.5–5.1)
Sodium: 140 mmol/L (ref 135–145)
TOTAL PROTEIN: 8.2 g/dL (ref 6.0–8.3)
Total Bilirubin: 0.8 mg/dL (ref 0.3–1.2)

## 2014-11-11 LAB — URINALYSIS, ROUTINE W REFLEX MICROSCOPIC
Bilirubin Urine: NEGATIVE
GLUCOSE, UA: NEGATIVE mg/dL
Hgb urine dipstick: NEGATIVE
Ketones, ur: 40 mg/dL — AB
Leukocytes, UA: NEGATIVE
Nitrite: NEGATIVE
Specific Gravity, Urine: 1.028 (ref 1.005–1.030)
Urobilinogen, UA: 1 mg/dL (ref 0.0–1.0)
pH: 6 (ref 5.0–8.0)

## 2014-11-11 LAB — CBC WITH DIFFERENTIAL/PLATELET
BASOS PCT: 0 % (ref 0–1)
Basophils Absolute: 0 10*3/uL (ref 0.0–0.1)
Eosinophils Absolute: 0 10*3/uL (ref 0.0–0.7)
Eosinophils Relative: 0 % (ref 0–5)
HEMATOCRIT: 33.5 % — AB (ref 36.0–46.0)
Hemoglobin: 10.5 g/dL — ABNORMAL LOW (ref 12.0–15.0)
Lymphocytes Relative: 15 % (ref 12–46)
Lymphs Abs: 0.9 10*3/uL (ref 0.7–4.0)
MCH: 24.8 pg — ABNORMAL LOW (ref 26.0–34.0)
MCHC: 31.3 g/dL (ref 30.0–36.0)
MCV: 79.2 fL (ref 78.0–100.0)
MONO ABS: 0.3 10*3/uL (ref 0.1–1.0)
Monocytes Relative: 5 % (ref 3–12)
NEUTROS ABS: 4.7 10*3/uL (ref 1.7–7.7)
Neutrophils Relative %: 79 % — ABNORMAL HIGH (ref 43–77)
PLATELETS: 210 10*3/uL (ref 150–400)
RBC: 4.23 MIL/uL (ref 3.87–5.11)
RDW: 15.8 % — AB (ref 11.5–15.5)
WBC: 5.9 10*3/uL (ref 4.0–10.5)

## 2014-11-11 LAB — TROPONIN I: Troponin I: <0.03 ng/mL (ref <0.031)

## 2014-11-11 LAB — POC OCCULT BLOOD, ED: Fecal Occult Bld: NEGATIVE

## 2014-11-11 MED ORDER — IOHEXOL 300 MG/ML  SOLN
50.0000 mL | Freq: Once | INTRAMUSCULAR | Status: AC | PRN
  Administered 2014-11-11: 50 mL via ORAL

## 2014-11-11 MED ORDER — METOCLOPRAMIDE HCL 5 MG/ML IJ SOLN
10.0000 mg | Freq: Once | INTRAMUSCULAR | Status: AC
  Administered 2014-11-11: 10 mg via INTRAVENOUS
  Filled 2014-11-11: qty 2

## 2014-11-11 MED ORDER — ONDANSETRON HCL 4 MG/2ML IJ SOLN
4.0000 mg | Freq: Once | INTRAMUSCULAR | Status: AC
  Administered 2014-11-11: 4 mg via INTRAVENOUS
  Filled 2014-11-11: qty 2

## 2014-11-11 MED ORDER — IOHEXOL 300 MG/ML  SOLN
100.0000 mL | Freq: Once | INTRAMUSCULAR | Status: AC | PRN
  Administered 2014-11-11: 100 mL via INTRAVENOUS

## 2014-11-11 MED ORDER — ONDANSETRON 8 MG PO TBDP
8.0000 mg | ORAL_TABLET | Freq: Once | ORAL | Status: AC
  Administered 2014-11-11: 8 mg via ORAL
  Filled 2014-11-11: qty 1

## 2014-11-11 MED ORDER — DOCUSATE SODIUM 100 MG PO CAPS: 100.0000 mg | ORAL_CAPSULE | Freq: Two times a day (BID) | ORAL | Status: DC

## 2014-11-11 MED ORDER — METOCLOPRAMIDE HCL 10 MG PO TABS: 10.0000 mg | ORAL_TABLET | Freq: Four times a day (QID) | ORAL | Status: DC | PRN

## 2014-11-11 MED ORDER — SODIUM CHLORIDE 0.9 % IV BOLUS (SEPSIS)
1000.0000 mL | Freq: Once | INTRAVENOUS | Status: AC
  Administered 2014-11-11: 1000 mL via INTRAVENOUS

## 2014-11-11 NOTE — ED Provider Notes (Signed)
CSN: [id number]     Arrival date & time 11/11/14  0911 History   First MD Initiated Contact with Patient 11/11/14 [id number]     Chief Complaint  Patient presents with  . Abdominal Pain  . Emesis     (Consider location/radiation/quality/duration/timing/severity/associated sxs/prior Treatment) HPI Complains of abdominal pain at right lower quadrant onset 3 days ago, waxes and wanes, nothing makes symptoms better or worse. Presently she denies abdominal pain but complains of severe nausea. No treatment prior to coming here. Last bowel movement was 2 days ago, a slight amount. She had diarrhea prior to that, last prior episode of diarrhea 11/01/2014. No fever. No other associated symptoms. No treatment prior to coming here. She presently feels constipated. Nothing makes symptoms better or worse. Past Medical History  Diagnosis Date  . Diabetes mellitus 1999    T2DM  . H/O gestational diabetes mellitus, not currently pregnant   . Asthma   . Hyperlipidemia   . Hypertension   . Obesity   . OSA (obstructive sleep apnea)   . Glaucoma   . Achilles rupture   . Depression   . Arthritis   . Anxiety   . Heart murmur   . Chronic headaches   . Chronic back pain   . Sciatica   . Chronic leg pain     bilaterally   Past Surgical History  Procedure Laterality Date  . Cholecystectomy  1991  . Cesarean section  12/10/89  . Breast lumpectomy      Right breast  . Tubal ligation    . Back surgery     No family history on file. History  Substance Use Topics  . Smoking status: Former Smoker    Types: Cigarettes  . Smokeless tobacco: Not on file  . Alcohol Use: Yes     Comment: occasional   OB History    Gravida Para Term Preterm AB TAB SAB Ectopic Multiple Living   1 1        1      Review of Systems  Gastrointestinal: Positive for nausea, vomiting, abdominal pain and constipation.  Musculoskeletal: Positive for back pain.       Chronic back pain  All other systems reviewed and are  negative.     Allergies  Eggs or egg-derived products; Milk-related compounds; and Pork-derived products  Home Medications   Prior to Admission medications   Medication Sig Start Date End Date Taking? Authorizing Provider  albuterol (PROVENTIL HFA;VENTOLIN HFA) 108 (90 BASE) MCG/ACT inhaler Inhale 2-3 puffs into the lungs every 6 (six) hours as needed for wheezing or shortness of breath. For shortness of breath 02/11/13   Robyn Haber, MD  Ascorbic Acid (VITAMIN C) 1000 MG tablet Take 1,000 mg by mouth daily.     Historical Provider, MD  B Complex-C (B-COMPLEX WITH VITAMIN C) tablet Take 1 tablet by mouth daily.    Historical Provider, MD  BAYER CONTOUR TEST test strip 1 each by Other route See admin instructions. Check blood sugar daily as needed for high blood sugar 06/03/14   Historical Provider, MD  Cholecalciferol (VITAMIN D-3) 5000 UNITS TABS Take 5,000 Units by mouth daily.    Historical Provider, MD  gabapentin (NEURONTIN) 300 MG capsule Take 1 capsule (300 mg total) by mouth daily. 04/08/14   Noland Fordyce, PA-C  HYDROcodone-acetaminophen (NORCO/VICODIN) 5-325 MG per tablet Take 1 tablet by mouth every 6 (six) hours as needed for moderate pain. 06/17/14   Ephraim Hamburger, MD  ibuprofen (ADVIL,MOTRIN) 200  MG tablet Take 800 mg by mouth every 6 (six) hours as needed for moderate pain.     Historical Provider, MD  insulin aspart (NOVOLOG) 100 UNIT/ML injection Inject 5-7 Units into the skin 3 (three) times daily with meals. Per sliding scale    Historical Provider, MD  insulin glargine (LANTUS) 100 UNIT/ML injection Inject 40 Units into the skin at bedtime.     Historical Provider, MD  metFORMIN (GLUCOPHAGE) 1000 MG tablet Take 1 tablet (1,000 mg total) by mouth 2 (two) times daily. PATIENT NEEDS OFFICE VISIT FOR ADDITIONAL REFILLS 02/16/14   Robyn Haber, MD  oxyCODONE-acetaminophen (PERCOCET/ROXICET) 5-325 MG per tablet Take 1 tablet by mouth every 6 (six) hours as needed. 06/24/14    Kristen N Ward, DO  pregabalin (LYRICA) 75 MG capsule Take 1 capsule (75 mg total) by mouth daily. 06/24/14   Kristen N Ward, DO  SIMBRINZA 1-0.2 % SUSP Place 1 drop into both eyes 2 (two) times daily. 01/02/14   Historical Provider, MD  travoprost, benzalkonium, (TRAVATAN) 0.004 % ophthalmic solution Place 1 drop into both eyes at bedtime.    Historical Provider, MD  valsartan-hydrochlorothiazide (DIOVAN-HCT) 160-25 MG per tablet Take 1 tablet by mouth daily. 01/02/14   Historical Provider, MD   BP 171/52 mmHg  Pulse 90  Temp(Src) 98.2 F (36.8 C) (Oral)  Resp 18  SpO2 98% Physical Exam  Constitutional: She appears well-developed and well-nourished.  HENT:  Head: Normocephalic and atraumatic.  Eyes: Conjunctivae are normal. Pupils are equal, round, and reactive to light.  Neck: Neck supple. No tracheal deviation present. No thyromegaly present.  Cardiovascular: Normal rate and regular rhythm.   No murmur heard. Pulmonary/Chest: Effort normal and breath sounds normal.  Abdominal: Soft. Bowel sounds are normal. She exhibits no distension. There is no tenderness.  Obese  Genitourinary: Guaiac negative stool.  Normal tone hard brown stool, no gross blood  Musculoskeletal: Normal range of motion. She exhibits no edema or tenderness.  Neurological: She is alert. Coordination normal.  Skin: Skin is warm and dry. No rash noted.  Psychiatric: She has a normal mood and affect.  Nursing note and vitals reviewed.   ED Course  Procedures (including critical care time) Labs Review Labs Reviewed - No data to display  Imaging Review No results found.   EKG Interpretation   Date/Time:  Wednesday November 11 2014 10:04:32 EST Ventricular Rate:  87 PR Interval:  116 QRS Duration: 82 QT Interval:  367 QTC Calculation: 441 R Axis:   -40 Text Interpretation:  Sinus rhythm Borderline short PR interval Left axis  deviation Abnormal R-wave progression, late transition Borderline   repolarization abnormality Baseline wander in lead(s) V2 No significant  change since last tracing Confirmed by Debby Freiberg [phone]) on  11/11/2014 10:13:08 AM     11:25 AM nausea improved after treatment with intravenous fluids.  1:55 PM patient remains comfortable she still reports that her nausea is under control. Results for orders placed or performed during the hospital encounter of 11/11/14  CBC with Differential/Platelet  Result Value Ref Range   WBC 5.9 4.0 - 10.5 K/uL   RBC 4.23 3.87 - 5.11 MIL/uL   Hemoglobin 10.5 (L) 12.0 - 15.0 g/dL   HCT 33.5 (L) 36.0 - 46.0 %   MCV 79.2 78.0 - 100.0 fL   MCH 24.8 (L) 26.0 - 34.0 pg   MCHC 31.3 30.0 - 36.0 g/dL   RDW 15.8 (H) 11.5 - 15.5 %   Platelets 210 150 - 400  K/uL   Neutrophils Relative % 79 (H) 43 - 77 %   Neutro Abs 4.7 1.7 - 7.7 K/uL   Lymphocytes Relative 15 12 - 46 %   Lymphs Abs 0.9 0.7 - 4.0 K/uL   Monocytes Relative 5 3 - 12 %   Monocytes Absolute 0.3 0.1 - 1.0 K/uL   Eosinophils Relative 0 0 - 5 %   Eosinophils Absolute 0.0 0.0 - 0.7 K/uL   Basophils Relative 0 0 - 1 %   Basophils Absolute 0.0 0.0 - 0.1 K/uL  Urinalysis, Routine w reflex microscopic  Result Value Ref Range   Color, Urine YELLOW YELLOW   APPearance CLOUDY (A) CLEAR   Specific Gravity, Urine 1.028 1.005 - 1.030   pH 6.0 5.0 - 8.0   Glucose, UA NEGATIVE NEGATIVE mg/dL   Hgb urine dipstick NEGATIVE NEGATIVE   Bilirubin Urine NEGATIVE NEGATIVE   Ketones, ur 40 (A) NEGATIVE mg/dL   Protein, ur >300 (A) NEGATIVE mg/dL   Urobilinogen, UA 1.0 0.0 - 1.0 mg/dL   Nitrite NEGATIVE NEGATIVE   Leukocytes, UA NEGATIVE NEGATIVE  Comprehensive metabolic panel  Result Value Ref Range   Sodium 140 135 - 145 mmol/L   Potassium 4.0 3.5 - 5.1 mmol/L   Chloride 105 96 - 112 mmol/L   CO2 23 19 - 32 mmol/L   Glucose, Bld 223 (H) 70 - 99 mg/dL   BUN 15 6 - 23 mg/dL   Creatinine, Ser 1.02 0.50 - 1.10 mg/dL   Calcium 9.5 8.4 - 10.5 mg/dL   Total Protein 8.2  6.0 - 8.3 g/dL   Albumin 4.1 3.5 - 5.2 g/dL   AST 19 0 - 37 U/L   ALT 13 0 - 35 U/L   Alkaline Phosphatase 45 39 - 117 U/L   Total Bilirubin 0.8 0.3 - 1.2 mg/dL   GFR calc non Af Amer 60 (L) >90 mL/min   GFR calc Af Amer 69 (L) >90 mL/min   Anion gap 12 5 - 15  Troponin I  Result Value Ref Range   Troponin I <0.03 <0.031 ng/mL  Urine microscopic-add on  Result Value Ref Range   RBC / HPF 0-2 <3 RBC/hpf   Bacteria, UA RARE RARE   Casts HYALINE CASTS (A) NEGATIVE   Urine-Other MUCOUS PRESENT   POC occult blood, ED Provider will collect  Result Value Ref Range   Fecal Occult Bld NEGATIVE NEGATIVE   Ct Abdomen Pelvis W Contrast  11/11/2014   CLINICAL DATA:  Three-day history of lower abdominal pain. One day history of nausea. Constipation.  EXAM: CT ABDOMEN AND PELVIS WITH CONTRAST  TECHNIQUE: Multidetector CT imaging of the abdomen and pelvis was performed using the standard protocol following bolus administration of intravenous contrast. Oral contrast was also administered.  CONTRAST:  135mL OMNIPAQUE IOHEXOL 300 MG/ML  SOLN  COMPARISON:  None.  FINDINGS: There is mild bibasilar lung atelectatic change.  No focal liver lesions are identified. Gallbladder is absent. There is no biliary duct dilatation.  Spleen, pancreas, and right adrenal appear normal. There is a probable left adrenal adenoma based on attenuation values measuring 2.0 x 1.6 cm.  There is a cyst in the lower pole of the right kidney measuring 1.4 x 1.6 cm. No other renal masses are identified. There is no hydronephrosis on either side. There is no renal or ureteral calculus on either side.  In the pelvis, the urinary bladder is midline with normal wall thickness. The uterus is enlarged with scattered  calcifications. Uterus measures 12.5 x 9.6 x 8.9 cm in size. Outside of the uterus, there is no pelvic mass. There is no free pelvic fluid.  There is fairly diffuse stool throughout the colon. There is no colonic wall thickening or  surrounding mesenteric thickening. Appendix appears normal.  There is no bowel obstruction.  No free air or portal venous air.  There is no appreciable ascites, adenopathy, or abscess in the abdomen or pelvis. There is some mild soft tissue stranding in the lateral abdominal wall regions bilaterally. There is no well-defined mass or fluid in the abdominal wall regions. There is no demonstrable abdominal aortic aneurysm. There are no blastic or lytic bone lesions. There is marked disc narrowing at L4-5 with vacuum phenomenon at this level. There is diffuse disc protrusion at this level, asymmetric toward the right, causing moderate spinal stenosis. There is evidence of prior lumbar surgery with scarring in the soft tissues of the mid back region.  IMPRESSION: Question constipation given fairly diffuse stool throughout colon.  No bowel obstruction. No abscess. Appendix appears normal. No mesenteric thickening.  Enlarged leiomyomatous uterus.  Apparent left adrenal adenoma.  Postoperative change in the posterior back region. Spinal stenosis at L4-5 due to diffuse disc protrusion and bony hypertrophy.  Nonspecific soft tissue stranding in the lateral abdominal wall on each side. Question dependent edema.   Electronically Signed   By: Lowella Grip III M.D.   On: 11/11/2014 13:26    MDM  Patient is chronically on oxymorphone for chronic back pain. She reports that most pain medicines cause her to be nauseated. Patient likely chronically constipated she has prescription for Colace which she has not filled. Final diagnoses:  None   plan prescription Reglan. Prescription Colace  MiraLAX as directed. We will not repeat prescribed Lasix, patient at risk for dehydration with decreased oral intake. She should be rechecked by her primary care physician within the next 5 days. Suggest pain clinic Diagnosis #1 constipation #2 nausea and vomiting #3 hyperglycemia #4 hypertension      Orlie Dakin,  MD 11/11/14 1410

## 2014-11-11 NOTE — ED Notes (Addendum)
Per EMS - patient comes from home where she lives with her family.  Patient c/o HNG abdominal pain x3 days with N/V beginning last night.  Patient denies fever.  Patient has not had a BM since 2/14.  Patient has been unable to take her HTN meds because of N/V.  Patient has not taken her Lasix since this past Sunday because she ran out of the meds. Patient's vitals 218/86, 98% RA, 97 HR, RR 20.  CBG 147.  7/10 abdominal pain.

## 2014-11-11 NOTE — ED Notes (Signed)
Bed: YW31 Expected date:  Expected time:  Means of arrival:  Comments: EMS- abdominal pain, constipation

## 2014-11-11 NOTE — ED Notes (Signed)
Patient presents with 3 day history of abdominal pain and a week long history of N/V.  Patient has not had a complete, productive BM since 2/14.  She states she feels bloated.  Patient's abdomen is mildly distended with some pain to palpation - >RLQ.  Patient actively vomited x1 on exam.  Patient's lung sounds clear, but diminished.  Hearts sounds WNL.  Patient has +1 radial and pedal pulses.

## 2014-11-11 NOTE — Discharge Instructions (Signed)
Constipation Take MiraLAX as directed for constipation also take Colace as prescribed which is a stool softener. You have been prescribed antinausea medicine to take as directed. Return if you cannot hold down the medicine prescribed without vomiting. Call your primary care physician tomorrow to arrange to be seen in within the next 5 days. Your blood sugar was mildly elevated today at 223. Your blood pressure should also be rechecked at your doctor's office visit within a week. Today's blood pressure was 198/62. Ask your doctor about coming off of oxymorphone, as opioid pain medicines cause nausea and constipation. You may benefit from a pain clinic Constipation is when a person has fewer than three bowel movements a week, has difficulty having a bowel movement, or has stools that are dry, hard, or larger than normal. As people grow older, constipation is more common. If you try to fix constipation with medicines that make you have a bowel movement (laxatives), the problem may get worse. Long-term laxative use may cause the muscles of the colon to become weak. A low-fiber diet, not taking in enough fluids, and taking certain medicines may make constipation worse.  CAUSES   Certain medicines, such as antidepressants, pain medicine, iron supplements, antacids, and water pills.   Certain diseases, such as diabetes, irritable bowel syndrome (IBS), thyroid disease, or depression.   Not drinking enough water.   Not eating enough fiber-rich foods.   Stress or travel.   Lack of physical activity or exercise.   Ignoring the urge to have a bowel movement.   Using laxatives too much.  SIGNS AND SYMPTOMS   Having fewer than three bowel movements a week.   Straining to have a bowel movement.   Having stools that are hard, dry, or larger than normal.   Feeling full or bloated.   Pain in the lower abdomen.   Not feeling relief after having a bowel movement.  DIAGNOSIS  Your health  care provider will take a medical history and perform a physical exam. Further testing may be done for severe constipation. Some tests may include:  A barium enema X-ray to examine your rectum, colon, and, sometimes, your small intestine.   A sigmoidoscopy to examine your lower colon.   A colonoscopy to examine your entire colon. TREATMENT  Treatment will depend on the severity of your constipation and what is causing it. Some dietary treatments include drinking more fluids and eating more fiber-rich foods. Lifestyle treatments may include regular exercise. If these diet and lifestyle recommendations do not help, your health care provider may recommend taking over-the-counter laxative medicines to help you have bowel movements. Prescription medicines may be prescribed if over-the-counter medicines do not work.  HOME CARE INSTRUCTIONS   Eat foods that have a lot of fiber, such as fruits, vegetables, whole grains, and beans.  Limit foods high in fat and processed sugars, such as french fries, hamburgers, cookies, candies, and soda.   A fiber supplement may be added to your diet if you cannot get enough fiber from foods.   Drink enough fluids to keep your urine clear or pale yellow.   Exercise regularly or as directed by your health care provider.   Go to the restroom when you have the urge to go. Do not hold it.   Only take over-the-counter or prescription medicines as directed by your health care provider. Do not take other medicines for constipation without talking to your health care provider first.  [place] IF:   You have  bright red blood in your stool.   Your constipation lasts for more than 4 days or gets worse.   You have abdominal or rectal pain.   You have thin, pencil-like stools.   You have unexplained weight loss. MAKE SURE YOU:   Understand these instructions.  Will watch your condition.  Will get help right away if you are not doing  well or get worse. Document Released: 06/02/2004 Document Revised: 09/09/2013 Document Reviewed: 06/16/2013 [facility] Patient Information 2015 [place], [place]. This information is not intended to replace advice given to you by your health care provider. Make sure you discuss any questions you have with your health care provider.

## 2014-12-15 DIAGNOSIS — J45909 Unspecified asthma, uncomplicated: Secondary | ICD-10-CM | POA: Insufficient documentation

## 2014-12-25 DIAGNOSIS — Z0271 Encounter for disability determination: Secondary | ICD-10-CM

## 2015-02-23 ENCOUNTER — Ambulatory Visit: Payer: Medicaid Other | Attending: Orthopedic Surgery

## 2015-02-23 DIAGNOSIS — R29898 Other symptoms and signs involving the musculoskeletal system: Secondary | ICD-10-CM | POA: Insufficient documentation

## 2015-02-23 DIAGNOSIS — R262 Difficulty in walking, not elsewhere classified: Secondary | ICD-10-CM | POA: Diagnosis not present

## 2015-02-23 DIAGNOSIS — M545 Low back pain: Secondary | ICD-10-CM | POA: Diagnosis present

## 2015-02-23 NOTE — Therapy (Addendum)
[place], [place], [address] Phone: [phone]   Fax:  [phone]  Physical Therapy Evaluation  Patient Details  Name: [name] MRN: [id number] Date of Birth: 12-05-1955 Referring Provider:  [place] Crane*  Encounter Date: 02/23/2015      PT End of Session - 02/23/15 1327    Visit Number 1   Authorization Type Medicaid : Eval only   PT Start Time 1225   PT Stop Time 1320   PT Time Calculation (min) 55 min   Activity Tolerance Patient tolerated treatment well   Behavior During Therapy [facility] for tasks assessed/performed      Past Medical History  Diagnosis Date  . Diabetes mellitus 1999    T2DM  . H/O gestational diabetes mellitus, not currently pregnant   . Asthma   . Hyperlipidemia   . Hypertension   . Obesity   . OSA (obstructive sleep apnea)   . Glaucoma   . Achilles rupture   . Depression   . Arthritis   . Anxiety   . Heart murmur   . Chronic headaches   . Chronic back pain   . Sciatica   . Chronic leg pain     bilaterally    Past Surgical History  Procedure Laterality Date  . Cholecystectomy  1991  . Cesarean section  12/10/89  . Breast lumpectomy      Right breast  . Tubal ligation    . Back surgery      There were no vitals filed for this visit.  Visit Diagnosis:  Right low back pain, with sciatica presence unspecified - Plan: PT plan of care cert/re-cert  Weakness of right hip - Plan: PT plan of care cert/re-cert  Difficulty walking - Plan: PT plan of care cert/re-cert      Subjective Assessment - 02/23/15 1234    Subjective She reports now post surgery for discectomy L3-4-5. No fusion. She has numbness in RT thigh and feet. She reports nerve damage.  She reports weakness in Rt leg   Pertinent History Ruptured heel cord. sciatic nerve pain.    Limitations Walking;Standing  Stairs   Patient Stated Goals walk without walker with improve strength.     Currently in Pain? Yes   Pain Score 6    Pain Location Back  thigh, abdomen   Pain Orientation Right   Pain Descriptors / Indicators Burning;Aching;Sharp;Constant   Pain Type Chronic pain   Pain Radiating Towards RT   Pain Onset More than a month ago   Pain Frequency Constant   Aggravating Factors  Standing, Walking   Pain Relieving Factors relaxation ,lyng down, medication   Multiple Pain Sites No            [facility] PT Assessment - 02/23/15 1240    Assessment   Medical Diagnosis Post lumbar surgery ,post laminectomy syndrome   Onset Date/Surgical Date 09/16/14   Next MD Visit 03/12/15   Prior Therapy She had IP PT for 2  weeks and was seen at home for 6 weeks   Precautions   Precautions None   Restrictions   Weight Bearing Restrictions No   Balance Screen   Has the patient fallen in the past 6 months Yes   How many times? 2  fell in bathroom legs gave out   Has the patient had a decrease in activity level because of a fear of falling?  Yes   Is the patient reluctant to leave their home  because of a fear of falling?  No   Home Environment   Living Environment Private residence   Living Arrangements Children   Type of Home Apartment   Prior Function   Level of Independence Needs assistance with ADLs   Cognition   Overall Cognitive Status Within Functional Limits for tasks assessed   ROM / Strength   AROM / PROM / Strength AROM;Strength   Strength   Overall Strength Comments Thighs and ankle 4+/5 to 5/5 except PF on RT 3/5 due to chronic  heel cord tear. Hips LT 4+/5 RT 2/5 flexion , 3-/abduction and extension, rotation 4/5,    Flexibility   Soft Tissue Assessment /Muscle Length yes   Hamstrings 45 degrees bilaterally   ITB tight bilaterally   Bed Mobility   Bed Mobility Rolling Right;Rolling Left;Supine to Sit;Sit to Supine   Rolling Right 6: Modified independent (Device/Increase time)   Rolling Left 6: Modified independent (Device/Increase time)   Supine to Sit 4:  Min assist   Supine to Sit Details (indicate cue type and reason) +one hand for pt to pull on   Sit to Supine 6: Modified independent (Device/Increase time)  She struggles with all  bed mobility   Transfers   Transfers Stand to Sit;Sit to Stand   Sit to Stand 6: Modified independent (Device/Increase time)   Stand to Sit 6: Modified independent (Device/Increase time)   Ambulation/Gait   Gait Comments She walks with RW                            PT Education - 02/23/15 1326    Education provided Yes   Education Details Limits of Eval only with medicaid and HEP   Person(s) Educated Patient;Child(ren)   Methods Explanation;Demonstration;Verbal cues;Tactile cues;Handout   Comprehension Returned demonstration;Verbalized understanding                    Plan - 02/23/15 1327    Clinical Impression Statement As Medicaid will not cover PT after 3 months post surgery she does not quaify for PT coverage from Medicaid. She opted not to come to PT due to finances. She was issued info on [facility] by [facility]. which ois a free clinic   Rehab Potential Good   PT Next Visit Plan EVAL only   PT Home Exercise Plan Issued HEP   Consulted and Agree with Plan of Care Patient;Family member/caregiver         Problem List Patient Active Problem List   Diagnosis Date Noted  . POSTMENOPAUSAL BLEEDING 05/09/2010  . ANEMIA 05/05/2010  . DEPRESSION 04/26/2010  . TMJ SYNDROME 04/26/2010  . DEGENERATIVE DISC DISEASE, CERVICAL SPINE 04/26/2010  . CHEST PAIN 04/26/2010  . CHRONIC OBSTRUCTIVE PULMONARY DISEASE, MODERATE 03/26/2010  . MASTALGIA 10/08/2009  . ANEMIA, IRON DEFICIENCY 07/12/2009  . DIABETES MELLITUS, TYPE II, ON INSULIN 07/02/2009  . HYPERLIPIDEMIA 07/02/2009  . OBESITY 07/02/2009  . SLEEP APNEA, OBSTRUCTIVE 07/02/2009  . GLAUCOMA 07/02/2009  . HYPERTENSION, ESSENTIAL 07/02/2009  . ASTHMA 07/02/2009  . LOW BACK PAIN SYNDROME 07/02/2009   . BACK PAIN 07/02/2009  . PERIPHERAL EDEMA 07/02/2009  . GROIN PAIN 07/02/2009  . TOBACCO USE, QUIT 07/02/2009  . HEMORRHOIDS, INTERNAL 07/16/2008  . DIVERTICULOSIS OF COLON 07/16/2008    Darrel Hoover PT 02/23/2015, 1:36 PM  [facility] [address] [place], [place], [address] Phone: [phone]   Fax:  [phone]    PHYSICAL THERAPY DISCHARGE SUMMARY  Visits from Start of Care: 1  Current functional level related to goals / functional outcomes: NA Eval only   Remaining deficits: Unknown   Education / Equipment: HEP Plan: Patient agrees to discharge.  Patient goals were not met. Patient is being discharged due to financial reasons.  ?????   [name]  PT    05/05/15  12:16 AM

## 2015-02-23 NOTE — Patient Instructions (Signed)
Issued from cabinet exercises for hip strength and hmastring stretching and trunk rotation . Also for rolling Rt and LT to help bed mobility. Asked her to do these 1-2x/day 10-20 reps RT and LT

## 2015-04-14 DIAGNOSIS — N3946 Mixed incontinence: Secondary | ICD-10-CM | POA: Insufficient documentation

## 2015-07-26 ENCOUNTER — Ambulatory Visit: Payer: BLUE CROSS/BLUE SHIELD | Admitting: Family Medicine

## 2015-09-24 ENCOUNTER — Emergency Department (HOSPITAL_COMMUNITY): Payer: Medicaid Other

## 2015-09-24 ENCOUNTER — Encounter (HOSPITAL_COMMUNITY): Payer: Self-pay | Admitting: Emergency Medicine

## 2015-09-24 ENCOUNTER — Emergency Department (HOSPITAL_COMMUNITY)
Admission: EM | Admit: 2015-09-24 | Discharge: 2015-09-24 | Disposition: A | Discharge status: Home / Self Care | Payer: Medicaid Other | Attending: Emergency Medicine | Admitting: Emergency Medicine

## 2015-09-24 DIAGNOSIS — F419 Anxiety disorder, unspecified: Secondary | ICD-10-CM | POA: Insufficient documentation

## 2015-09-24 DIAGNOSIS — Y998 Other external cause status: Secondary | ICD-10-CM | POA: Insufficient documentation

## 2015-09-24 DIAGNOSIS — G8929 Other chronic pain: Secondary | ICD-10-CM | POA: Diagnosis not present

## 2015-09-24 DIAGNOSIS — E119 Type 2 diabetes mellitus without complications: Secondary | ICD-10-CM | POA: Insufficient documentation

## 2015-09-24 DIAGNOSIS — Y92410 Unspecified street and highway as the place of occurrence of the external cause: Secondary | ICD-10-CM | POA: Insufficient documentation

## 2015-09-24 DIAGNOSIS — E669 Obesity, unspecified: Secondary | ICD-10-CM | POA: Diagnosis not present

## 2015-09-24 DIAGNOSIS — J45909 Unspecified asthma, uncomplicated: Secondary | ICD-10-CM | POA: Diagnosis not present

## 2015-09-24 DIAGNOSIS — M545 Low back pain: Secondary | ICD-10-CM

## 2015-09-24 DIAGNOSIS — Z79899 Other long term (current) drug therapy: Secondary | ICD-10-CM | POA: Insufficient documentation

## 2015-09-24 DIAGNOSIS — Y9389 Activity, other specified: Secondary | ICD-10-CM | POA: Diagnosis not present

## 2015-09-24 DIAGNOSIS — H409 Unspecified glaucoma: Secondary | ICD-10-CM | POA: Insufficient documentation

## 2015-09-24 DIAGNOSIS — S79912A Unspecified injury of left hip, initial encounter: Secondary | ICD-10-CM | POA: Diagnosis not present

## 2015-09-24 DIAGNOSIS — F329 Major depressive disorder, single episode, unspecified: Secondary | ICD-10-CM | POA: Insufficient documentation

## 2015-09-24 DIAGNOSIS — Z794 Long term (current) use of insulin: Secondary | ICD-10-CM | POA: Insufficient documentation

## 2015-09-24 DIAGNOSIS — I10 Essential (primary) hypertension: Secondary | ICD-10-CM | POA: Insufficient documentation

## 2015-09-24 DIAGNOSIS — Z8632 Personal history of gestational diabetes: Secondary | ICD-10-CM | POA: Insufficient documentation

## 2015-09-24 DIAGNOSIS — R011 Cardiac murmur, unspecified: Secondary | ICD-10-CM | POA: Diagnosis not present

## 2015-09-24 DIAGNOSIS — S4991XA Unspecified injury of right shoulder and upper arm, initial encounter: Secondary | ICD-10-CM | POA: Diagnosis not present

## 2015-09-24 DIAGNOSIS — S3992XA Unspecified injury of lower back, initial encounter: Secondary | ICD-10-CM | POA: Diagnosis not present

## 2015-09-24 DIAGNOSIS — M1990 Unspecified osteoarthritis, unspecified site: Secondary | ICD-10-CM | POA: Diagnosis not present

## 2015-09-24 DIAGNOSIS — Z87891 Personal history of nicotine dependence: Secondary | ICD-10-CM | POA: Insufficient documentation

## 2015-09-24 DIAGNOSIS — M25552 Pain in left hip: Secondary | ICD-10-CM

## 2015-09-24 MED ORDER — HYDROCODONE-ACETAMINOPHEN 5-325 MG PO TABS
1.0000 | ORAL_TABLET | Freq: Once | ORAL | Status: AC
  Administered 2015-09-24: 1 via ORAL
  Filled 2015-09-24: qty 1

## 2015-09-24 NOTE — ED Notes (Signed)
Upon patient arrival, patient request to use the bathroom. Vital signs were delayed due to this reason.
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
## 2015-09-24 NOTE — Discharge Instructions (Signed)
Your x-rays did not show serious injury today. Take your home tramadol and ibuprofen for pain. Return for worsening symptoms, including new numbness/weakness, inability to walk, or any other symptoms concerning to you.  Back Pain, Adult Back pain is very common. The pain often gets better over time. The cause of back pain is usually not dangerous. Most people can learn to manage their back pain on their own.  HOME CARE  Watch your back pain for any changes. The following actions may help to lessen any pain you are feeling:  Stay active. Start with short walks on flat ground if you can. Try to walk farther each day.  Exercise regularly as told by your doctor. Exercise helps your back heal faster. It also helps avoid future injury by keeping your muscles strong and flexible.  Do not sit, drive, or stand in one place for more than 30 minutes.  Do not stay in bed. Resting more than 1-2 days can slow down your recovery.  Be careful when you bend or lift an object. Use good form when lifting:  Bend at your knees.  Keep the object close to your body.  Do not twist.  Sleep on a firm mattress. Lie on your side, and bend your knees. If you lie on your back, put a pillow under your knees.  Take medicines only as told by your doctor.  Put ice on the injured area.  Put ice in a plastic bag.  Place a towel between your skin and the bag.  Leave the ice on for 20 minutes, 2-3 times a day for the first 2-3 days. After that, you can switch between ice and heat packs.  Avoid feeling anxious or stressed. Find good ways to deal with stress, such as exercise.  Maintain a healthy weight. Extra weight puts stress on your back. GET HELP IF:   You have pain that does not go away with rest or medicine.  You have worsening pain that goes down into your legs or buttocks.  You have pain that does not get better in one week.  You have pain at night.  You lose weight.  You have a fever or  chills. GET HELP RIGHT AWAY IF:   You cannot control when you poop (bowel movement) or pee (urinate).  Your arms or legs feel weak.  Your arms or legs lose feeling (numbness).  You feel sick to your stomach (nauseous) or throw up (vomit).  You have belly (abdominal) pain.  You feel like you may pass out (faint).   This information is not intended to replace advice given to you by your health care provider. Make sure you discuss any questions you have with your health care provider.   Document Released: 02/21/2008 Document Revised: 09/25/2014 Document Reviewed: 01/06/2014 Elsevier Interactive Patient Education 2016 [employer]    Musculoskeletal Pain Musculoskeletal pain is muscle and boney aches and pains. These pains can occur in any part of the body. Your caregiver may treat you without knowing the cause of the pain. They may treat you if blood or urine tests, X-rays, and other tests were normal.  CAUSES There is often not a definite cause or reason for these pains. These pains may be caused by a type of germ (virus). The discomfort may also come from overuse. Overuse includes working out too hard when your body is not fit. Boney aches also come from weather changes. Bone is sensitive to atmospheric pressure changes. HOME CARE INSTRUCTIONS   Ask  when your test results will be ready. Make sure you get your test results.  Only take over-the-counter or prescription medicines for pain, discomfort, or fever as directed by your caregiver. If you were given medications for your condition, do not drive, operate machinery or power tools, or sign legal documents for 24 hours. Do not drink alcohol. Do not take sleeping pills or other medications that may interfere with treatment.  Continue all activities unless the activities cause more pain. When the pain lessens, slowly resume normal activities. Gradually increase the intensity and duration of the activities or exercise.  During periods  of severe pain, bed rest may be helpful. Lay or sit in any position that is comfortable.  Putting ice on the injured area.  Put ice in a bag.  Place a towel between your skin and the bag.  Leave the ice on for 15 to 20 minutes, 3 to 4 times a day.  Follow up with your caregiver for continued problems and no reason can be found for the pain. If the pain becomes worse or does not go away, it may be necessary to repeat tests or do additional testing. Your caregiver may need to look further for a possible cause. SEEK IMMEDIATE MEDICAL CARE IF:  You have pain that is getting worse and is not relieved by medications.  You develop chest pain that is associated with shortness or breath, sweating, feeling sick to your stomach (nauseous), or throw up (vomit).  Your pain becomes localized to the abdomen.  You develop any new symptoms that seem different or that concern you. MAKE SURE YOU:   Understand these instructions.  Will watch your condition.  Will get help right away if you are not doing well or get worse.   This information is not intended to replace advice given to you by your health care provider. Make sure you discuss any questions you have with your health care provider.   Document Released: 09/04/2005 Document Revised: 11/27/2011 Document Reviewed: 05/09/2013 Elsevier Interactive Patient Education [employer].
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
## 2015-10-08 ENCOUNTER — Emergency Department (HOSPITAL_COMMUNITY): Payer: Medicaid Other

## 2015-10-08 ENCOUNTER — Emergency Department (HOSPITAL_COMMUNITY)
Admission: EM | Admit: 2015-10-08 | Discharge: 2015-10-08 | Disposition: A | Discharge status: Home / Self Care | Payer: Medicaid Other | Attending: Emergency Medicine | Admitting: Emergency Medicine

## 2015-10-08 ENCOUNTER — Encounter (HOSPITAL_COMMUNITY): Payer: Self-pay

## 2015-10-08 DIAGNOSIS — Z794 Long term (current) use of insulin: Secondary | ICD-10-CM | POA: Insufficient documentation

## 2015-10-08 DIAGNOSIS — E119 Type 2 diabetes mellitus without complications: Secondary | ICD-10-CM | POA: Diagnosis not present

## 2015-10-08 DIAGNOSIS — R011 Cardiac murmur, unspecified: Secondary | ICD-10-CM | POA: Diagnosis not present

## 2015-10-08 DIAGNOSIS — R609 Edema, unspecified: Secondary | ICD-10-CM

## 2015-10-08 DIAGNOSIS — H409 Unspecified glaucoma: Secondary | ICD-10-CM | POA: Insufficient documentation

## 2015-10-08 DIAGNOSIS — F329 Major depressive disorder, single episode, unspecified: Secondary | ICD-10-CM | POA: Diagnosis not present

## 2015-10-08 DIAGNOSIS — Z7984 Long term (current) use of oral hypoglycemic drugs: Secondary | ICD-10-CM | POA: Diagnosis not present

## 2015-10-08 DIAGNOSIS — G8929 Other chronic pain: Secondary | ICD-10-CM | POA: Diagnosis not present

## 2015-10-08 DIAGNOSIS — R42 Dizziness and giddiness: Secondary | ICD-10-CM | POA: Diagnosis not present

## 2015-10-08 DIAGNOSIS — J45909 Unspecified asthma, uncomplicated: Secondary | ICD-10-CM | POA: Insufficient documentation

## 2015-10-08 DIAGNOSIS — Z87891 Personal history of nicotine dependence: Secondary | ICD-10-CM | POA: Insufficient documentation

## 2015-10-08 DIAGNOSIS — Z79899 Other long term (current) drug therapy: Secondary | ICD-10-CM | POA: Insufficient documentation

## 2015-10-08 DIAGNOSIS — I10 Essential (primary) hypertension: Secondary | ICD-10-CM | POA: Insufficient documentation

## 2015-10-08 DIAGNOSIS — Z8632 Personal history of gestational diabetes: Secondary | ICD-10-CM | POA: Diagnosis not present

## 2015-10-08 DIAGNOSIS — E669 Obesity, unspecified: Secondary | ICD-10-CM | POA: Insufficient documentation

## 2015-10-08 DIAGNOSIS — Z791 Long term (current) use of non-steroidal anti-inflammatories (NSAID): Secondary | ICD-10-CM | POA: Diagnosis not present

## 2015-10-08 DIAGNOSIS — M1990 Unspecified osteoarthritis, unspecified site: Secondary | ICD-10-CM | POA: Diagnosis not present

## 2015-10-08 DIAGNOSIS — F419 Anxiety disorder, unspecified: Secondary | ICD-10-CM | POA: Insufficient documentation

## 2015-10-08 DIAGNOSIS — R600 Localized edema: Secondary | ICD-10-CM | POA: Diagnosis not present

## 2015-10-08 LAB — CBC
HEMATOCRIT: 33.1 % — AB (ref 36.0–46.0)
Hemoglobin: 10.3 g/dL — ABNORMAL LOW (ref 12.0–15.0)
MCH: 25.6 pg — ABNORMAL LOW (ref 26.0–34.0)
MCHC: 31.1 g/dL (ref 30.0–36.0)
MCV: 82.3 fL (ref 78.0–100.0)
Platelets: 172 10*3/uL (ref 150–400)
RBC: 4.02 MIL/uL (ref 3.87–5.11)
RDW: 14.5 % (ref 11.5–15.5)
WBC: 5.9 10*3/uL (ref 4.0–10.5)

## 2015-10-08 LAB — URINALYSIS, ROUTINE W REFLEX MICROSCOPIC
Bilirubin Urine: NEGATIVE
GLUCOSE, UA: 100 mg/dL — AB
Ketones, ur: NEGATIVE mg/dL
LEUKOCYTES UA: NEGATIVE
Nitrite: NEGATIVE
PH: 5.5 (ref 5.0–8.0)
Protein, ur: >300 mg/dL — AB
Specific Gravity, Urine: 1.026 (ref 1.005–1.030)

## 2015-10-08 LAB — BRAIN NATRIURETIC PEPTIDE: B Natriuretic Peptide: 107.5 pg/mL — ABNORMAL HIGH (ref 0.0–100.0)

## 2015-10-08 LAB — BASIC METABOLIC PANEL
Anion gap: 10 (ref 5–15)
BUN: 24 mg/dL — AB (ref 6–20)
CO2: 24 mmol/L (ref 22–32)
Calcium: 9 mg/dL (ref 8.9–10.3)
Chloride: 106 mmol/L (ref 101–111)
Creatinine, Ser: 1.01 mg/dL — ABNORMAL HIGH (ref 0.44–1.00)
GFR calc non Af Amer: >60 mL/min (ref >60)
Glucose, Bld: 259 mg/dL — ABNORMAL HIGH (ref 65–99)
POTASSIUM: 3.9 mmol/L (ref 3.5–5.1)
SODIUM: 140 mmol/L (ref 135–145)

## 2015-10-08 LAB — URINE MICROSCOPIC-ADD ON

## 2015-10-08 NOTE — ED Provider Notes (Signed)
CSN: [id number]     Arrival date & time 10/08/15  1742 History   First MD Initiated Contact with Patient 10/08/15 2015     Chief Complaint  Patient presents with  . Dizziness     (Consider location/radiation/quality/duration/timing/severity/associated sxs/prior Treatment) HPI   [name] is a 60 y.o. female who is here primarily for lower extremity edema, which has been ongoing for several weeks and worsening. She is taking her usual medications, but alternates Lasix, 1-2 times a day based on how she feels. She denies chest pain, weakness or paresthesia. His intermittent dizziness, which is worse with moving her head. No headache. There are no other known modifying factors.   Past Medical History  Diagnosis Date  . Diabetes mellitus 1999    T2DM  . H/O gestational diabetes mellitus, not currently pregnant   . Asthma   . Hyperlipidemia   . Hypertension   . Obesity   . OSA (obstructive sleep apnea)   . Glaucoma   . Achilles rupture   . Depression   . Arthritis   . Anxiety   . Heart murmur   . Chronic headaches   . Chronic back pain   . Sciatica   . Chronic leg pain     bilaterally   Past Surgical History  Procedure Laterality Date  . Cholecystectomy  1991  . Cesarean section  12/10/89  . Breast lumpectomy      Right breast  . Tubal ligation    . Back surgery    . Cervical spine surgery    . Lumbar spine surgery     History reviewed. No pertinent family history. Social History  Substance Use Topics  . Smoking status: Former Smoker    Types: Cigarettes  . Smokeless tobacco: None  . Alcohol Use: Yes     Comment: occasional   OB History    Gravida Para Term Preterm AB TAB SAB Ectopic Multiple Living   1 1        1      Review of Systems  All other systems reviewed and are negative.     Allergies  Eggs or egg-derived products; Milk-related compounds; and Pork-derived products  Home Medications   Prior to Admission medications   Medication Sig Start  Date End Date Taking? Authorizing Provider  albuterol (PROVENTIL HFA;VENTOLIN HFA) 108 (90 BASE) MCG/ACT inhaler Inhale 2-3 puffs into the lungs every 6 (six) hours as needed for wheezing or shortness of breath. For shortness of breath 02/11/13  Yes Robyn Haber, MD  amLODipine (NORVASC) 10 MG tablet Take 10 mg by mouth daily.   Yes Historical Provider, MD  BAYER CONTOUR TEST test strip 1 each by Other route. Seven to eight times daily. 06/03/14  Yes Historical Provider, MD  celecoxib (CELEBREX) 200 MG capsule Take 200 mg by mouth daily.   Yes Historical Provider, MD  Dorzolamide HCl-Timolol Mal PF 22.3-6.8 MG/ML SOLN Place 1 drop into both eyes 2 (two) times daily.   Yes Historical Provider, MD  doxazosin (CARDURA) 4 MG tablet Take 4 mg by mouth at bedtime.   Yes Historical Provider, MD  furosemide (LASIX) 20 MG tablet Take 20 mg by mouth daily.   Yes Historical Provider, MD  insulin aspart (NOVOLOG) 100 UNIT/ML injection Inject into the skin 3 (three) times daily with meals. Per sliding scale  141-180= 3 units 181-220= 6 units 221-260= 9 units 261-300= 12 units 301-340= 15 units >341= 18 units   Yes Historical Provider, MD  insulin glargine (  LANTUS) 100 UNIT/ML injection Inject 30 Units into the skin at bedtime.   Yes Historical Provider, MD  lactose free nutrition (BOOST) LIQD Take 237 mLs by mouth every other day. As needed-- lack of appetite.   Yes Historical Provider, MD  metoprolol succinate (TOPROL-XL) 50 MG 24 hr tablet Take 50 mg by mouth daily. Take with or immediately following a meal.   Yes Historical Provider, MD  traMADol (ULTRAM) 50 MG tablet Take by mouth every 6 (six) hours as needed for moderate pain or severe pain.    Yes Historical Provider, MD  travoprost, benzalkonium, (TRAVATAN) 0.004 % ophthalmic solution Place 1 drop into both eyes at bedtime.   Yes Historical Provider, MD  docusate sodium (COLACE) 100 MG capsule Take 1 capsule (100 mg total) by mouth every 12 (twelve)  hours. Patient not taking: Reported on 09/24/2015 11/11/14   Orlie Dakin, MD  gabapentin (NEURONTIN) 300 MG capsule Take 1 capsule (300 mg total) by mouth daily. Patient not taking: Reported on 02/23/2015 04/08/14   Noland Fordyce, PA-C  metFORMIN (GLUCOPHAGE) 1000 MG tablet Take 1 tablet (1,000 mg total) by mouth 2 (two) times daily. PATIENT NEEDS OFFICE VISIT FOR ADDITIONAL REFILLS Patient not taking: Reported on 09/24/2015 02/16/14   Robyn Haber, MD  metoCLOPramide (REGLAN) 10 MG tablet Take 1 tablet (10 mg total) by mouth every 6 (six) hours as needed for nausea. Patient not taking: Reported on 09/24/2015 11/11/14   Orlie Dakin, MD  oxyCODONE-acetaminophen (PERCOCET/ROXICET) 5-325 MG per tablet Take 1 tablet by mouth every 6 (six) hours as needed. Patient not taking: Reported on 09/24/2015 06/24/14   Kristen N Ward, DO  potassium chloride SA (K-DUR,KLOR-CON) 20 MEQ tablet Take 20 mEq by mouth daily.    Historical Provider, MD  pregabalin (LYRICA) 75 MG capsule Take 1 capsule (75 mg total) by mouth daily. Patient not taking: Reported on 02/23/2015 06/24/14   Kristen N Ward, DO   BP 156/68 mmHg  Pulse 49  Temp(Src) 98.6 F (37 C) (Oral)  Resp 17  Wt 235 lb (106.595 kg)  SpO2 95% Physical Exam  Constitutional: She is oriented to person, place, and time. She appears well-developed and well-nourished.  HENT:  Head: Normocephalic and atraumatic.  Right Ear: External ear normal.  Left Ear: External ear normal.  Eyes: Conjunctivae and EOM are normal. Pupils are equal, round, and reactive to light.  Neck: Normal range of motion and phonation normal. Neck supple.  Cardiovascular: Normal rate, regular rhythm and normal heart sounds.   Pulmonary/Chest: Effort normal and breath sounds normal. She exhibits no bony tenderness.  Abdominal: Soft. She exhibits no distension. There is no tenderness.  Musculoskeletal: Normal range of motion. She exhibits edema (3+, bilateral). She exhibits no tenderness.   Neurological: She is alert and oriented to person, place, and time. No cranial nerve deficit or sensory deficit. She exhibits normal muscle tone. Coordination normal.  No dysarthria and aphasia or nystagmus  Skin: Skin is warm, dry and intact.  Psychiatric: She has a normal mood and affect. Her behavior is normal. Judgment and thought content normal.  Nursing note and vitals reviewed.   ED Course  Procedures (including critical care time) Medications - No data to display  Patient Vitals for the past 24 hrs:  BP Temp Temp src Pulse Resp SpO2 Weight  10/08/15 2230 156/68 mmHg - - (!) 49 17 95 % -  10/08/15 2215 158/66 mmHg - - (!) 51 16 96 % -  10/08/15 2200 175/68 mmHg - - Marland Kitchen)  50 18 96 % -  10/08/15 2155 - - - - - - 235 lb (106.595 kg)  10/08/15 2145 164/67 mmHg - - (!) 50 20 97 % -  10/08/15 2130 164/66 mmHg - - (!) 53 20 98 % -  10/08/15 2002 179/64 mmHg 98.6 F (37 C) Oral (!) 58 20 99 % -  10/08/15 1752 161/67 mmHg 98.7 F (37.1 C) Oral 63 20 99 % -    10:58 PM Reevaluation with update and discussion. After initial assessment and treatment, an updated evaluation reveals no change in clinical status. Findings discussed with patient, all questions answered. Nieves Barberi L    Labs Review Labs Reviewed  BASIC METABOLIC PANEL - Abnormal; Notable for the following:    Glucose, Bld 259 (*)    BUN 24 (*)    Creatinine, Ser 1.01 (*)    All other components within normal limits  CBC - Abnormal; Notable for the following:    Hemoglobin 10.3 (*)    HCT 33.1 (*)    MCH 25.6 (*)    All other components within normal limits  URINALYSIS, ROUTINE W REFLEX MICROSCOPIC (NOT AT [facility]) - Abnormal; Notable for the following:    Glucose, UA 100 (*)    Hgb urine dipstick TRACE (*)    Protein, ur >300 (*)    All other components within normal limits  BRAIN NATRIURETIC PEPTIDE - Abnormal; Notable for the following:    B Natriuretic Peptide 107.5 (*)    All other components within normal  limits  URINE MICROSCOPIC-ADD ON - Abnormal; Notable for the following:    Squamous Epithelial / LPF 0-5 (*)    Bacteria, UA RARE (*)    All other components within normal limits    Imaging Review Dg Chest 2 View  10/08/2015  CLINICAL DATA:  Acute onset of shortness of breath and bilateral lower extremity swelling. Central and left-sided chest pain. Initial encounter. EXAM: CHEST  2 VIEW COMPARISON:  Chest radiograph performed 06/17/2014 FINDINGS: The lungs are well-aerated. Mild vascular congestion is noted. There is no evidence of focal opacification, pleural effusion or pneumothorax. The heart is mildly enlarged. No acute osseous abnormalities are seen. Cervical spinal fusion hardware is noted. IMPRESSION: Mild vascular congestion and mild cardiomegaly. Lungs remain grossly clear. Electronically Signed   By: Garald Balding M.D.   On: 10/08/2015 21:02   I have personally reviewed and evaluated these images and lab results as part of my medical decision-making.   EKG Interpretation   Date/Time:  Friday October 08 2015 20:48:39 EST Ventricular Rate:  50 PR Interval:  134 QRS Duration: 103 QT Interval:  444 QTC Calculation: 405 R Axis:   -5 Text Interpretation:  Sinus rhythm Nonspecific T abnormalities, lateral  leads Since last tracing rate slower Confirmed by Lorrinda Ramstad  MD, Terree Gaultney  [id number]) on 10/08/2015 8:59:24 PM      MDM   Final diagnoses:  None    Peripheral edema, worsening, with irregular use of diuretic. Doubt ACS, PE or pneumonia. Doubt acute congestive heart failure. Nonspecific dizziness, which is intermittent. Doubt CVA.   Nursing Notes Reviewed/ Care Coordinated Applicable Imaging Reviewed Interpretation of Laboratory Data incorporated into ED treatment  The patient appears reasonably screened and/or stabilized for discharge and I doubt any other medical condition or other [facility] requiring further screening, evaluation, or treatment in the ED at this time prior to  discharge.  Plan: Home Medications- Take Lasix BID every day; Home Treatments- Elevate legs as much as possible3;  return here if the recommended treatment, does not improve the symptoms; Recommended follow up- PCP check up in 5-6 days   Daleen Bo, MD 10/08/15 2300

## 2015-10-08 NOTE — ED Notes (Signed)
Pt c/o dizziness x 3 days.  Denies new pain.  Pt reports "I can't lay down and I have to keep my head still."  Pt reports similar symptoms previously, but was never given a diagnosis.

## 2015-10-08 NOTE — Discharge Instructions (Signed)
Take your furosemide, twice a day. Elevate her legs above your heart, as much as possible during the day. See your doctor for checkup in 5 or 6 days. Return here if needed, for problems.   Peripheral Edema You have swelling in your legs (peripheral edema). This swelling is due to excess accumulation of salt and water in your body. Edema may be a sign of heart, kidney or liver disease, or a side effect of a medication. It may also be due to problems in the leg veins. Elevating your legs and using special support stockings may be very helpful, if the cause of the swelling is due to poor venous circulation. Avoid long periods of standing, whatever the cause. Treatment of edema depends on identifying the cause. Chips, pretzels, pickles and other salty foods should be avoided. Restricting salt in your diet is almost always needed. Water pills (diuretics) are often used to remove the excess salt and water from your body via urine. These medicines prevent the kidney from reabsorbing sodium. This increases urine flow. Diuretic treatment may also result in lowering of potassium levels in your body. Potassium supplements may be needed if you have to use diuretics daily. Daily weights can help you keep track of your progress in clearing your edema. You should call your caregiver for follow up care as recommended. SEEK IMMEDIATE MEDICAL CARE IF:   You have increased swelling, pain, redness, or heat in your legs.  You develop shortness of breath, especially when lying down.  You develop chest or abdominal pain, weakness, or fainting.  You have a fever.   This information is not intended to replace advice given to you by your health care provider. Make sure you discuss any questions you have with your health care provider.   Document Released: 10/12/2004 Document Revised: 11/27/2011 Document Reviewed: 03/17/2015 Elsevier Interactive Patient Education 2016 [employer]  Dizziness Dizziness is a common  problem. It is a feeling of unsteadiness or light-headedness. You may feel like you are about to faint. Dizziness can lead to injury if you stumble or fall. Anyone can become dizzy, but dizziness is more common in older adults. This condition can be caused by a number of things, including medicines, dehydration, or illness. HOME CARE INSTRUCTIONS Taking these steps may help with your condition: Eating and Drinking  Drink enough fluid to keep your urine clear or pale yellow. This helps to keep you from becoming dehydrated. Try to drink more clear fluids, such as water.  Do not drink alcohol.  Limit your caffeine intake if directed by your health care provider.  Limit your salt intake if directed by your health care provider. Activity  Avoid making quick movements.  Rise slowly from chairs and steady yourself until you feel okay.  In the morning, first sit up on the side of the bed. When you feel okay, stand slowly while you hold onto something until you know that your balance is fine.  Move your legs often if you need to stand in one place for a long time. Tighten and relax your muscles in your legs while you are standing.  Do not drive or operate heavy machinery if you feel dizzy.  Avoid bending down if you feel dizzy. Place items in your home so that they are easy for you to reach without leaning over. Lifestyle  Do not use any tobacco products, including cigarettes, chewing tobacco, or electronic cigarettes. If you need help quitting, ask your health care provider.  Try to reduce  your stress level, such as with yoga or meditation. Talk with your health care provider if you need help. General Instructions  Watch your dizziness for any changes.  Take medicines only as directed by your health care provider. Talk with your health care provider if you think that your dizziness is caused by a medicine that you are taking.  Tell a friend or a family member that you are feeling dizzy.  If he or she notices any changes in your behavior, have this person call your health care provider.  Keep all follow-up visits as directed by your health care provider. This is important. SEEK MEDICAL CARE IF:  Your dizziness does not go away.  Your dizziness or light-headedness gets worse.  You feel nauseous.  You have reduced hearing.  You have new symptoms.  You are unsteady on your feet or you feel like the room is spinning. SEEK IMMEDIATE MEDICAL CARE IF:  You vomit or have diarrhea and are unable to eat or drink anything.  You have problems talking, walking, swallowing, or using your arms, hands, or legs.  You feel generally weak.  You are not thinking clearly or you have trouble forming sentences. It may take a friend or family member to notice this.  You have chest pain, abdominal pain, shortness of breath, or sweating.  Your vision changes.  You notice any bleeding.  You have a headache.  You have neck pain or a stiff neck.  You have a fever.   This information is not intended to replace advice given to you by your health care provider. Make sure you discuss any questions you have with your health care provider.   Document Released: 02/28/2001 Document Revised: 01/19/2015 Document Reviewed: 08/31/2014 Elsevier Interactive Patient Education [employer].

## 2015-11-09 ENCOUNTER — Emergency Department (HOSPITAL_COMMUNITY)
Admission: EM | Admit: 2015-11-09 | Discharge: 2015-11-10 | Disposition: A | Discharge status: Home / Self Care | Payer: Medicaid Other | Attending: Emergency Medicine | Admitting: Emergency Medicine

## 2015-11-09 ENCOUNTER — Ambulatory Visit: Payer: BLUE CROSS/BLUE SHIELD

## 2015-11-09 DIAGNOSIS — Z87891 Personal history of nicotine dependence: Secondary | ICD-10-CM | POA: Diagnosis not present

## 2015-11-09 DIAGNOSIS — Z794 Long term (current) use of insulin: Secondary | ICD-10-CM | POA: Diagnosis not present

## 2015-11-09 DIAGNOSIS — R011 Cardiac murmur, unspecified: Secondary | ICD-10-CM | POA: Diagnosis not present

## 2015-11-09 DIAGNOSIS — I10 Essential (primary) hypertension: Secondary | ICD-10-CM | POA: Insufficient documentation

## 2015-11-09 DIAGNOSIS — R112 Nausea with vomiting, unspecified: Secondary | ICD-10-CM | POA: Diagnosis present

## 2015-11-09 DIAGNOSIS — H409 Unspecified glaucoma: Secondary | ICD-10-CM | POA: Insufficient documentation

## 2015-11-09 DIAGNOSIS — E1165 Type 2 diabetes mellitus with hyperglycemia: Secondary | ICD-10-CM | POA: Insufficient documentation

## 2015-11-09 DIAGNOSIS — F419 Anxiety disorder, unspecified: Secondary | ICD-10-CM | POA: Insufficient documentation

## 2015-11-09 DIAGNOSIS — Z8632 Personal history of gestational diabetes: Secondary | ICD-10-CM | POA: Insufficient documentation

## 2015-11-09 DIAGNOSIS — F329 Major depressive disorder, single episode, unspecified: Secondary | ICD-10-CM | POA: Insufficient documentation

## 2015-11-09 DIAGNOSIS — M5430 Sciatica, unspecified side: Secondary | ICD-10-CM | POA: Diagnosis not present

## 2015-11-09 DIAGNOSIS — Z7984 Long term (current) use of oral hypoglycemic drugs: Secondary | ICD-10-CM | POA: Insufficient documentation

## 2015-11-09 DIAGNOSIS — R739 Hyperglycemia, unspecified: Secondary | ICD-10-CM

## 2015-11-09 DIAGNOSIS — Z3202 Encounter for pregnancy test, result negative: Secondary | ICD-10-CM | POA: Diagnosis not present

## 2015-11-09 DIAGNOSIS — J45909 Unspecified asthma, uncomplicated: Secondary | ICD-10-CM | POA: Insufficient documentation

## 2015-11-09 DIAGNOSIS — Z791 Long term (current) use of non-steroidal anti-inflammatories (NSAID): Secondary | ICD-10-CM | POA: Diagnosis not present

## 2015-11-09 DIAGNOSIS — E669 Obesity, unspecified: Secondary | ICD-10-CM | POA: Insufficient documentation

## 2015-11-09 DIAGNOSIS — G8929 Other chronic pain: Secondary | ICD-10-CM | POA: Insufficient documentation

## 2015-11-09 DIAGNOSIS — Z79899 Other long term (current) drug therapy: Secondary | ICD-10-CM | POA: Insufficient documentation

## 2015-11-09 LAB — CBC
HEMATOCRIT: 41 % (ref 36.0–46.0)
Hemoglobin: 12.8 g/dL (ref 12.0–15.0)
MCH: 25 pg — ABNORMAL LOW (ref 26.0–34.0)
MCHC: 31.2 g/dL (ref 30.0–36.0)
MCV: 80.2 fL (ref 78.0–100.0)
Platelets: 194 10*3/uL (ref 150–400)
RBC: 5.11 MIL/uL (ref 3.87–5.11)
RDW: 14.9 % (ref 11.5–15.5)
WBC: 10.5 10*3/uL (ref 4.0–10.5)

## 2015-11-09 LAB — COMPREHENSIVE METABOLIC PANEL
ALBUMIN: 3.6 g/dL (ref 3.5–5.0)
ALK PHOS: 54 U/L (ref 38–126)
ALT: 16 U/L (ref 14–54)
AST: 25 U/L (ref 15–41)
Anion gap: 12 (ref 5–15)
BILIRUBIN TOTAL: 0.5 mg/dL (ref 0.3–1.2)
BUN: 25 mg/dL — AB (ref 6–20)
CO2: 23 mmol/L (ref 22–32)
Calcium: 9.1 mg/dL (ref 8.9–10.3)
Chloride: 103 mmol/L (ref 101–111)
Creatinine, Ser: 1.26 mg/dL — ABNORMAL HIGH (ref 0.44–1.00)
GFR calc Af Amer: 53 mL/min — ABNORMAL LOW (ref >60)
GFR calc non Af Amer: 46 mL/min — ABNORMAL LOW (ref >60)
GLUCOSE: 520 mg/dL — AB (ref 65–99)
POTASSIUM: 4.1 mmol/L (ref 3.5–5.1)
Sodium: 138 mmol/L (ref 135–145)
TOTAL PROTEIN: 7.3 g/dL (ref 6.5–8.1)

## 2015-11-09 LAB — URINALYSIS, ROUTINE W REFLEX MICROSCOPIC
BILIRUBIN URINE: NEGATIVE
KETONES UR: NEGATIVE mg/dL
Leukocytes, UA: NEGATIVE
NITRITE: NEGATIVE
PH: 6 (ref 5.0–8.0)
Protein, ur: >300 mg/dL — AB
Specific Gravity, Urine: 1.04 — ABNORMAL HIGH (ref 1.005–1.030)

## 2015-11-09 LAB — URINE MICROSCOPIC-ADD ON

## 2015-11-09 LAB — CBG MONITORING, ED
GLUCOSE-CAPILLARY: 372 mg/dL — AB (ref 65–99)
Glucose-Capillary: 501 mg/dL — ABNORMAL HIGH (ref 65–99)
Glucose-Capillary: 309 mg/dL — ABNORMAL HIGH (ref 65–99)

## 2015-11-09 LAB — LIPASE, BLOOD: Lipase: 30 U/L (ref 11–51)

## 2015-11-09 LAB — I-STAT BETA HCG BLOOD, ED (MC, WL, AP ONLY): I-stat hCG, quantitative: <5 m[IU]/mL (ref <5)

## 2015-11-09 MED ORDER — SODIUM CHLORIDE 0.9 % IV BOLUS (SEPSIS)
1000.0000 mL | Freq: Once | INTRAVENOUS | Status: AC
  Administered 2015-11-09: 1000 mL via INTRAVENOUS

## 2015-11-09 MED ORDER — PROMETHAZINE HCL 25 MG PO TABS: 25.0000 mg | ORAL_TABLET | Freq: Four times a day (QID) | ORAL | Status: DC | PRN

## 2015-11-09 MED ORDER — DOXAZOSIN MESYLATE 4 MG PO TABS
4.0000 mg | ORAL_TABLET | Freq: Every day | ORAL | Status: DC
  Administered 2015-11-09: 4 mg via ORAL
  Filled 2015-11-09: qty 1

## 2015-11-09 MED ORDER — ONDANSETRON HCL 4 MG/2ML IJ SOLN
4.0000 mg | Freq: Once | INTRAMUSCULAR | Status: AC
  Administered 2015-11-09: 4 mg via INTRAVENOUS
  Filled 2015-11-09: qty 2

## 2015-11-09 MED ORDER — SODIUM CHLORIDE 0.9 % IV BOLUS (SEPSIS)
500.0000 mL | Freq: Once | INTRAVENOUS | Status: AC
  Administered 2015-11-09: 500 mL via INTRAVENOUS

## 2015-11-09 MED ORDER — METOCLOPRAMIDE HCL 5 MG/ML IJ SOLN
10.0000 mg | Freq: Once | INTRAMUSCULAR | Status: AC
  Administered 2015-11-09: 10 mg via INTRAVENOUS
  Filled 2015-11-09: qty 2

## 2015-11-09 MED ORDER — INSULIN ASPART 100 UNIT/ML ~~LOC~~ SOLN
10.0000 [IU] | Freq: Once | SUBCUTANEOUS | Status: AC
  Administered 2015-11-09: 10 [IU] via SUBCUTANEOUS
  Filled 2015-11-09: qty 1

## 2015-11-09 NOTE — ED Notes (Signed)
[facility] [id number] 

## 2015-11-09 NOTE — Discharge Instructions (Signed)
Read the information below.  Use the prescribed medication as directed.  Please discuss all new medications with your pharmacist.  You may return to the Emergency Department at any time for worsening condition or any new symptoms that concern you.    If you develop high fevers, abdominal pain, uncontrolled vomiting, or are unable to tolerate fluids by mouth, return to the ER for a recheck.     Nausea and Vomiting Nausea is a sick feeling that often comes before throwing up (vomiting). Vomiting is a reflex where stomach contents come out of your mouth. Vomiting can cause severe loss of body fluids (dehydration). Children and elderly adults can become dehydrated quickly, especially if they also have diarrhea. Nausea and vomiting are symptoms of a condition or disease. It is important to find the cause of your symptoms. CAUSES   Direct irritation of the stomach lining. This irritation can result from increased acid production (gastroesophageal reflux disease), infection, food poisoning, taking certain medicines (such as nonsteroidal anti-inflammatory drugs), alcohol use, or tobacco use.  Signals from the brain.These signals could be caused by a headache, heat exposure, an inner ear disturbance, increased pressure in the brain from injury, infection, a tumor, or a concussion, pain, emotional stimulus, or metabolic problems.  An obstruction in the gastrointestinal tract (bowel obstruction).  Illnesses such as diabetes, hepatitis, gallbladder problems, appendicitis, kidney problems, cancer, sepsis, atypical symptoms of a heart attack, or eating disorders.  Medical treatments such as chemotherapy and radiation.  Receiving medicine that makes you sleep (general anesthetic) during surgery. DIAGNOSIS Your caregiver may ask for tests to be done if the problems do not improve after a few days. Tests may also be done if symptoms are severe or if the reason for the nausea and vomiting is not clear. Tests may  include:  Urine tests.  Blood tests.  Stool tests.  Cultures (to look for evidence of infection).  X-rays or other imaging studies. Test results can help your caregiver make decisions about treatment or the need for additional tests. TREATMENT You need to stay well hydrated. Drink frequently but in small amounts.You may wish to drink water, sports drinks, clear broth, or eat frozen ice pops or gelatin dessert to help stay hydrated.When you eat, eating slowly may help prevent nausea.There are also some antinausea medicines that may help prevent nausea. HOME CARE INSTRUCTIONS   Take all medicine as directed by your caregiver.  If you do not have an appetite, do not force yourself to eat. However, you must continue to drink fluids.  If you have an appetite, eat a normal diet unless your caregiver tells you differently.  Eat a variety of complex carbohydrates (rice, wheat, potatoes, bread), lean meats, yogurt, fruits, and vegetables.  Avoid high-fat foods because they are more difficult to digest.  Drink enough water and fluids to keep your urine clear or pale yellow.  If you are dehydrated, ask your caregiver for specific rehydration instructions. Signs of dehydration may include:  Severe thirst.  Dry lips and mouth.  Dizziness.  Dark urine.  Decreasing urine frequency and amount.  Confusion.  Rapid breathing or pulse. SEEK IMMEDIATE MEDICAL CARE IF:   You have blood or brown flecks (like coffee grounds) in your vomit.  You have black or bloody stools.  You have a severe headache or stiff neck.  You are confused.  You have severe abdominal pain.  You have chest pain or trouble breathing.  You do not urinate at least once every 8 hours.  You develop cold or clammy skin.  You continue to vomit for longer than 24 to 48 hours.  You have a fever. MAKE SURE YOU:   Understand these instructions.  Will watch your condition.  Will get help right away if  you are not doing well or get worse.   This information is not intended to replace advice given to you by your health care provider. Make sure you discuss any questions you have with your health care provider.   Document Released: 09/04/2005 Document Revised: 11/27/2011 Document Reviewed: 02/01/2011 Elsevier Interactive Patient Education 2016 [employer].   Hypertension Hypertension, commonly called high blood pressure, is when the force of blood pumping through your arteries is too strong. Your arteries are the blood vessels that carry blood from your heart throughout your body. A blood pressure reading consists of a higher number over a lower number, such as 110/72. The higher number (systolic) is the pressure inside your arteries when your heart pumps. The lower number (diastolic) is the pressure inside your arteries when your heart relaxes. Ideally you want your blood pressure below 120/80. Hypertension forces your heart to work harder to pump blood. Your arteries may become narrow or stiff. Having untreated or uncontrolled hypertension can cause heart attack, stroke, kidney disease, and other problems. RISK FACTORS Some risk factors for high blood pressure are controllable. Others are not.  Risk factors you cannot control include:   Race. You may be at higher risk if you are African American.  Age. Risk increases with age.  Gender. Men are at higher risk than women before age 27 years. After age 32, women are at higher risk than men. Risk factors you can control include:  Not getting enough exercise or physical activity.  Being overweight.  Getting too much fat, sugar, calories, or salt in your diet.  Drinking too much alcohol. SIGNS AND SYMPTOMS Hypertension does not usually cause signs or symptoms. Extremely high blood pressure (hypertensive crisis) may cause headache, anxiety, shortness of breath, and nosebleed. DIAGNOSIS To check if you have hypertension, your health care  provider will measure your blood pressure while you are seated, with your arm held at the level of your heart. It should be measured at least twice using the same arm. Certain conditions can cause a difference in blood pressure between your right and left arms. A blood pressure reading that is higher than normal on one occasion does not mean that you need treatment. If it is not clear whether you have high blood pressure, you may be asked to return on a different day to have your blood pressure checked again. Or, you may be asked to monitor your blood pressure at home for 1 or more weeks. TREATMENT Treating high blood pressure includes making lifestyle changes and possibly taking medicine. Living a healthy lifestyle can help lower high blood pressure. You may need to change some of your habits. Lifestyle changes may include:  Following the DASH diet. This diet is high in fruits, vegetables, and whole grains. It is low in salt, red meat, and added sugars.  Keep your sodium intake below 2,300 mg per day.  Getting at least 30-45 minutes of aerobic exercise at least 4 times per week.  Losing weight if necessary.  Not smoking.  Limiting alcoholic beverages.  Learning ways to reduce stress. Your health care provider may prescribe medicine if lifestyle changes are not enough to get your blood pressure under control, and if one of the following is true:  You are 9-50 years of age and your systolic blood pressure is above 140.  You are 24 years of age or older, and your systolic blood pressure is above 150.  Your diastolic blood pressure is above 90.  You have diabetes, and your systolic blood pressure is over [id number] or your diastolic blood pressure is over 90.  You have kidney disease and your blood pressure is above 140/90.  You have heart disease and your blood pressure is above 140/90. Your personal target blood pressure may vary depending on your medical conditions, your age, and other  factors. HOME CARE INSTRUCTIONS  Have your blood pressure rechecked as directed by your health care provider.   Take medicines only as directed by your health care provider. Follow the directions carefully. Blood pressure medicines must be taken as prescribed. The medicine does not work as well when you skip doses. Skipping doses also puts you at risk for problems.  Do not smoke.   Monitor your blood pressure at home as directed by your health care provider. SEEK MEDICAL CARE IF:   You think you are having a reaction to medicines taken.  You have recurrent headaches or feel dizzy.  You have swelling in your ankles.  You have trouble with your vision. SEEK IMMEDIATE MEDICAL CARE IF:  You develop a severe headache or confusion.  You have unusual weakness, numbness, or feel faint.  You have severe chest or abdominal pain.  You vomit repeatedly.  You have trouble breathing. MAKE SURE YOU:   Understand these instructions.  Will watch your condition.  Will get help right away if you are not doing well or get worse.   This information is not intended to replace advice given to you by your health care provider. Make sure you discuss any questions you have with your health care provider.   Document Released: 09/04/2005 Document Revised: 01/19/2015 Document Reviewed: 06/27/2013 Elsevier Interactive Patient Education 2016 [place].  Hyperglycemia Hyperglycemia occurs when the glucose (sugar) in your blood is too high. Hyperglycemia can happen for many reasons, but it most often happens to people who do not know they have diabetes or are not managing their diabetes properly.  CAUSES  Whether you have diabetes or not, there are other causes of hyperglycemia. Hyperglycemia can occur when you have diabetes, but it can also occur in other situations that you might not be as aware of, such as: Diabetes  If you have diabetes and are having problems controlling your blood  glucose, hyperglycemia could occur because of some of the following reasons:  Not following your meal plan.  Not taking your diabetes medications or not taking it properly.  Exercising less or doing less activity than you normally do.  Being sick. Pre-diabetes  This cannot be ignored. Before people develop Type 2 diabetes, they almost always have "pre-diabetes." This is when your blood glucose levels are higher than normal, but not yet high enough to be diagnosed as diabetes. Research has shown that some long-term damage to the body, especially the heart and circulatory system, may already be occurring during pre-diabetes. If you take action to manage your blood glucose when you have pre-diabetes, you may delay or prevent Type 2 diabetes from developing. Stress  If you have diabetes, you may be "diet" controlled or on oral medications or insulin to control your diabetes. However, you may find that your blood glucose is higher than usual in the hospital whether you have diabetes or not. This is often referred  to as "stress hyperglycemia." Stress can elevate your blood glucose. This happens because of hormones put out by the body during times of stress. If stress has been the cause of your high blood glucose, it can be followed regularly by your caregiver. That way he/she can make sure your hyperglycemia does not continue to get worse or progress to diabetes. Steroids  Steroids are medications that act on the infection fighting system (immune system) to block inflammation or infection. One side effect can be a rise in blood glucose. Most people can produce enough extra insulin to allow for this rise, but for those who cannot, steroids make blood glucose levels go even higher. It is not unusual for steroid treatments to "uncover" diabetes that is developing. It is not always possible to determine if the hyperglycemia will go away after the steroids are stopped. A special blood test called an A1c is  sometimes done to determine if your blood glucose was elevated before the steroids were started. SYMPTOMS  Thirsty.  Frequent urination.  Dry mouth.  Blurred vision.  Tired or fatigue.  Weakness.  Sleepy.  Tingling in feet or leg. DIAGNOSIS  Diagnosis is made by monitoring blood glucose in one or all of the following ways:  A1c test. This is a chemical found in your blood.  Fingerstick blood glucose monitoring.  Laboratory results. TREATMENT  First, knowing the cause of the hyperglycemia is important before the hyperglycemia can be treated. Treatment may include, but is not be limited to:  Education.  Change or adjustment in medications.  Change or adjustment in meal plan.  Treatment for an illness, infection, etc.  More frequent blood glucose monitoring.  Change in exercise plan.  Decreasing or stopping steroids.  Lifestyle changes. HOME CARE INSTRUCTIONS   Test your blood glucose as directed.  Exercise regularly. Your caregiver will give you instructions about exercise. Pre-diabetes or diabetes which comes on with stress is helped by exercising.  Eat wholesome, balanced meals. Eat often and at regular, fixed times. Your caregiver or nutritionist will give you a meal plan to guide your sugar intake.  Being at an ideal weight is important. If needed, losing as little as 10 to 15 pounds may help improve blood glucose levels. SEEK MEDICAL CARE IF:   You have questions about medicine, activity, or diet.  You continue to have symptoms (problems such as increased thirst, urination, or weight gain). SEEK IMMEDIATE MEDICAL CARE IF:   You are vomiting or have diarrhea.  Your breath smells fruity.  You are breathing faster or slower.  You are very sleepy or incoherent.  You have numbness, tingling, or pain in your feet or hands.  You have chest pain.  Your symptoms get worse even though you have been following your caregiver's orders.  If you have any  other questions or concerns.   This information is not intended to replace advice given to you by your health care provider. Make sure you discuss any questions you have with your health care provider.   Document Released: 02/28/2001 Document Revised: 11/27/2011 Document Reviewed: 05/11/2015 Elsevier Interactive Patient Education [employer].

## 2015-11-09 NOTE — ED Provider Notes (Signed)
CSN: [id number]     Arrival date & time 11/09/15  1618 History   First MD Initiated Contact with Patient 11/09/15 1707     Chief Complaint  Patient presents with  . Emesis     (Consider location/radiation/quality/duration/timing/severity/associated sxs/prior Treatment) The history is provided by the patient.     Pt with hx DM, HTN, HLD, obesity, food allergies (eggs, dairy, pork) p/w N/V that began 3 days ago while eating at a church meal.  She has had frequent N/V and looser stool than normal but not more frequent.  Saw PCP Dr Criss Rosales yesterday and was given an injection for the nausea, d/c home with zofran.  Vomiting increased after that and was uncontrolled, she was then instructed to come to ED.  Has not been able to tolerate PO and has not been taking insulin because she hasn't been eating.   Denies fevers, myalgias, CP, SOB, abdominal pain, urinary or vaginal symptoms.  Denies hematemesis.   Past Medical History  Diagnosis Date  . Diabetes mellitus 1999    T2DM  . H/O gestational diabetes mellitus, not currently pregnant   . Asthma   . Hyperlipidemia   . Hypertension   . Obesity   . OSA (obstructive sleep apnea)   . Glaucoma   . Achilles rupture   . Depression   . Arthritis   . Anxiety   . Heart murmur   . Chronic headaches   . Chronic back pain   . Sciatica   . Chronic leg pain     bilaterally   Past Surgical History  Procedure Laterality Date  . Cholecystectomy  1991  . Cesarean section  12/10/89  . Breast lumpectomy      Right breast  . Tubal ligation    . Back surgery    . Cervical spine surgery    . Lumbar spine surgery     No family history on file. Social History  Substance Use Topics  . Smoking status: Former Smoker    Types: Cigarettes  . Smokeless tobacco: Not on file  . Alcohol Use: Yes     Comment: occasional   OB History    Gravida Para Term Preterm AB TAB SAB Ectopic Multiple Living   1 1        1      Review of Systems  All other  systems reviewed and are negative.     Allergies  Eggs or egg-derived products; Milk-related compounds; and Pork-derived products  Home Medications   Prior to Admission medications   Medication Sig Start Date End Date Taking? Authorizing Provider  acetaminophen (TYLENOL) 500 MG tablet Take 1,000 mg by mouth every 6 (six) hours as needed for moderate pain.   Yes Historical Provider, MD  amLODipine (NORVASC) 10 MG tablet Take 10 mg by mouth daily.   Yes Historical Provider, MD  celecoxib (CELEBREX) 200 MG capsule Take 200 mg by mouth daily.   Yes Historical Provider, MD  Dorzolamide HCl-Timolol Mal PF 22.3-6.8 MG/ML SOLN Place 1 drop into both eyes 2 (two) times daily.   Yes Historical Provider, MD  doxazosin (CARDURA) 4 MG tablet Take 4 mg by mouth at bedtime.   Yes Historical Provider, MD  empagliflozin (JARDIANCE) 10 MG TABS tablet Take 10 mg by mouth daily.   Yes Historical Provider, MD  ibuprofen (ADVIL,MOTRIN) 200 MG tablet Take 400 mg by mouth every 6 (six) hours as needed for moderate pain.   Yes Historical Provider, MD  lactose free nutrition (BOOST)  LIQD Take 237 mLs by mouth every other day. As needed-- lack of appetite.   Yes Historical Provider, MD  metoprolol succinate (TOPROL-XL) 50 MG 24 hr tablet Take 50 mg by mouth daily. Take with or immediately following a meal.   Yes Historical Provider, MD  ondansetron (ZOFRAN) 4 MG tablet Take 4 mg by mouth every 6 (six) hours as needed. for nausea 11/08/15  Yes Historical Provider, MD  travoprost, benzalkonium, (TRAVATAN) 0.004 % ophthalmic solution Place 1 drop into both eyes at bedtime.   Yes Historical Provider, MD  albuterol (PROVENTIL HFA;VENTOLIN HFA) 108 (90 BASE) MCG/ACT inhaler Inhale 2-3 puffs into the lungs every 6 (six) hours as needed for wheezing or shortness of breath. For shortness of breath 02/11/13   Robyn Haber, MD  docusate sodium (COLACE) 100 MG capsule Take 1 capsule (100 mg total) by mouth every 12 (twelve)  hours. Patient not taking: Reported on 09/24/2015 11/11/14   Orlie Dakin, MD  gabapentin (NEURONTIN) 300 MG capsule Take 1 capsule (300 mg total) by mouth daily. Patient not taking: Reported on 02/23/2015 04/08/14   Noland Fordyce, PA-C  insulin aspart (NOVOLOG) 100 UNIT/ML injection Inject into the skin 3 (three) times daily with meals. Per sliding scale  141-180= 3 units 181-220= 6 units 221-260= 9 units 261-300= 12 units 301-340= 15 units >341= 18 units    Historical Provider, MD  insulin glargine (LANTUS) 100 UNIT/ML injection Inject 30 Units into the skin at bedtime.    Historical Provider, MD  metFORMIN (GLUCOPHAGE) 1000 MG tablet Take 1 tablet (1,000 mg total) by mouth 2 (two) times daily. PATIENT NEEDS OFFICE VISIT FOR ADDITIONAL REFILLS Patient not taking: Reported on 09/24/2015 02/16/14   Robyn Haber, MD  metoCLOPramide (REGLAN) 10 MG tablet Take 1 tablet (10 mg total) by mouth every 6 (six) hours as needed for nausea. Patient not taking: Reported on 09/24/2015 11/11/14   Orlie Dakin, MD  oxyCODONE-acetaminophen (PERCOCET/ROXICET) 5-325 MG per tablet Take 1 tablet by mouth every 6 (six) hours as needed. Patient not taking: Reported on 09/24/2015 06/24/14   Kristen N Ward, DO  pregabalin (LYRICA) 75 MG capsule Take 1 capsule (75 mg total) by mouth daily. Patient not taking: Reported on 02/23/2015 06/24/14   Delice Bison Ward, DO  traMADol (ULTRAM) 50 MG tablet Take by mouth every 6 (six) hours as needed for moderate pain or severe pain.     Historical Provider, MD   BP 204/72 mmHg  Pulse 65  Temp(Src) 98.6 F (37 C) (Oral)  Resp 20  SpO2 100% Physical Exam  Constitutional: She appears well-developed and well-nourished. No distress.  HENT:  Head: Normocephalic and atraumatic.  Neck: Neck supple.  Cardiovascular: Normal rate and regular rhythm.   Pulmonary/Chest: Effort normal and breath sounds normal. No respiratory distress. She has no wheezes. She has no rales.  Abdominal: Soft. She  exhibits no distension. There is no tenderness. There is no rebound and no guarding.  obese  Neurological: She is alert.  Skin: She is not diaphoretic.  Nursing note and vitals reviewed.   ED Course  Procedures (including critical care time) Labs Review Labs Reviewed  COMPREHENSIVE METABOLIC PANEL - Abnormal; Notable for the following:    Glucose, Bld 520 (*)    BUN 25 (*)    Creatinine, Ser 1.26 (*)    GFR calc non Af Amer 46 (*)    GFR calc Af Amer 53 (*)    All other components within normal limits  CBC - Abnormal;  Notable for the following:    MCH 25.0 (*)    All other components within normal limits  URINALYSIS, ROUTINE W REFLEX MICROSCOPIC (NOT AT [facility]) - Abnormal; Notable for the following:    Specific Gravity, Urine 1.040 (*)    Glucose, UA >1000 (*)    Hgb urine dipstick MODERATE (*)    Protein, ur >300 (*)    All other components within normal limits  URINE MICROSCOPIC-ADD ON - Abnormal; Notable for the following:    Squamous Epithelial / LPF 0-5 (*)    Bacteria, UA FEW (*)    All other components within normal limits  CBG MONITORING, ED - Abnormal; Notable for the following:    Glucose-Capillary 501 (*)    All other components within normal limits  CBG MONITORING, ED - Abnormal; Notable for the following:    Glucose-Capillary 372 (*)    All other components within normal limits  CBG MONITORING, ED - Abnormal; Notable for the following:    Glucose-Capillary 309 (*)    All other components within normal limits  LIPASE, BLOOD  I-STAT BETA HCG BLOOD, ED ([facility], [facility], [facility] ONLY)    Imaging Review No results found. I have personally reviewed and evaluated these images and lab results as part of my medical decision-making.   EKG Interpretation None       Filed Vitals:   11/09/15 2135 11/09/15 2235  BP: 170/68 185/72  Pulse:  69  Temp:    Resp:  18    CBG  11:02 PM Feeling better, tolerating PO.   ED ECG REPORT   Date: 11/10/2015  Rate: 50  Rhythm:  sinus bradycardia  QRS Axis: normal  Intervals: normal  ST/T Wave abnormalities: normal and nonspecific T wave changes  Conduction Disutrbances:none  Narrative Interpretation:   Old EKG Reviewed: none available  I have personally reviewed the EKG tracing and agree with the computerized printout as noted.   MDM   Final diagnoses:  Non-intractable vomiting with nausea, vomiting of unspecified type  Hyperglycemia  Essential hypertension    Afebrile nontoxic patient with hx DM p/w uncontrolled N/V x 4 days.  No abdominal pain.  NO significant change in BMs.  No urinary or vaginal symptoms.  CBG 508.  IVF given.   Labs remarkable for slight worsening of renal function, hyperglycemia.  Anion gap is 12, no DKA.  IVF, insulin given with improvement.  Pt also with HTN, home medications given with improvement.  No CP, SOB, palpitations, severe headache, no AMS.  She did get sleepy after reglan but improved with time.  Tolerated PO and pt reports she was feeling much better.   She denies any abdominal pain and she has a benign abdominal exam.  Doubt SBO.  Pt also seen and examined by Dr Ralene Bathe.  D/C home with phenergan, close PCP follow up for recheck of her renal function.  Discussed result, findings, treatment, and follow up  with patient.  Pt given return precautions.  Pt verbalizes understanding and agrees with plan.        Clayton Bibles, PA-C 11/10/15 [id number]  Quintella Reichert, MD 11/12/15 [phone]

## 2015-11-09 NOTE — ED Notes (Signed)
Pt was given water for PO challenge.  Pt was able take sips and drink 113ml of water.  Pt sts she has a little nausea but no vomiting.

## 2015-11-09 NOTE — ED Notes (Signed)
Provider in room  

## 2015-11-09 NOTE — ED Notes (Signed)
Pt states she has had N/V x 3 days. Saw PCP who gave her medication that has not stopped the vomiting. Alert and oriented.

## 2015-11-16 ENCOUNTER — Ambulatory Visit: Payer: BLUE CROSS/BLUE SHIELD

## 2015-11-23 ENCOUNTER — Ambulatory Visit: Payer: BLUE CROSS/BLUE SHIELD

## 2016-01-04 ENCOUNTER — Ambulatory Visit: Payer: BLUE CROSS/BLUE SHIELD

## 2016-01-11 ENCOUNTER — Ambulatory Visit: Payer: BLUE CROSS/BLUE SHIELD

## 2016-01-18 ENCOUNTER — Ambulatory Visit: Payer: BLUE CROSS/BLUE SHIELD

## 2016-01-20 ENCOUNTER — Encounter: Payer: Self-pay | Admitting: Skilled Nursing Facility1

## 2016-01-20 ENCOUNTER — Encounter: Payer: Medicaid Other | Attending: Family Medicine | Admitting: Skilled Nursing Facility1

## 2016-01-20 VITALS — Ht 64.0 in | Wt 226.0 lb

## 2016-01-20 DIAGNOSIS — E119 Type 2 diabetes mellitus without complications: Secondary | ICD-10-CM | POA: Insufficient documentation

## 2016-01-20 NOTE — Progress Notes (Signed)
Diabetes Self-Management Education  Visit Type: First/Initial  Appt. Start Time: 11:00Appt. End Time: 12:30  01/20/2016  [name], identified by name and date of birth, is a 60 y.o. female with a diagnosis of Diabetes: Type 2.   ASSESSMENT  Height 5\' 4"  (1.626 m), weight 226 lb (102.513 kg). Body mass index is 38.77 kg/(m^2).      Diabetes Self-Management Education - 01/20/16 1110    Visit Information   Visit Type First/Initial   Initial Visit   Diabetes Type Type 2   Are you currently following a meal plan? No   Are you taking your medications as prescribed? Yes  someimtes she reports forgetting it   Date Diagnosed many years ago   Psychosocial Assessment   Patient Belief/Attitude about Diabetes Motivated to manage diabetes   Self-care barriers Lack of transportation   Self-management support Church;Family   Patient Concerns Nutrition/Meal planning   Special Needs None   Preferred Learning Style Auditory   Learning Readiness Contemplating   Pre-Education Assessment   Patient understands the diabetes disease and treatment process. Needs Instruction   Patient understands incorporating nutritional management into lifestyle. Needs Review   Patient undertands incorporating physical activity into lifestyle. Needs Instruction   Patient understands using medications safely. Demonstrates understanding / competency   Patient understands monitoring blood glucose, interpreting and using results Needs Instruction   Patient understands prevention, detection, and treatment of acute complications. Needs Instruction   Patient understands prevention, detection, and treatment of chronic complications. Needs Instruction   Patient understands how to develop strategies to address psychosocial issues. Needs Instruction   Patient understands how to develop strategies to promote health/change behavior. Needs Instruction   Complications   Last HgB A1C per patient/outside source 8.6 %   How often do you check your blood sugar? 1-2 times/day   Fasting Blood glucose range (mg/dL) 70-129   Have you had a dilated eye exam in the past 12 months? Yes   Have you had a dental exam in the past 12 months? Yes   Are you checking your feet? Yes   How many days per week are you checking your feet? 7   Dietary Intake   Breakfast grits and cheese----chicken sandwich------salad   Snack (morning) ice cream   Lunch peanut butter and jelly sandwich   Snack (afternoon) chips---croutons---fruit   Dinner same as lunch   Beverage(s) sugar free flavored water, diet soda, water   Exercise   Exercise Type Moderate (swimming / aerobic walking)  chair yoga and swiming and walking   How many days per week to you exercise? 4   Patient Education   Previous Diabetes Education No   Disease state  Definition of diabetes, type 1 and 2, and the diagnosis of diabetes   Nutrition management  Role of diet in the treatment of diabetes and the relationship between the three main macronutrients and blood glucose level;Food label reading, portion sizes and measuring food.;Carbohydrate counting;Reviewed blood glucose goals for pre and post meals and how to evaluate the patients' food intake on their blood glucose level.;Meal timing in regards to the patients' current diabetes medication.;Information on hints to eating out and maintain blood glucose control.;Meal options for control of blood glucose level and chronic complications.   Physical activity and exercise  Role of exercise on diabetes management, blood pressure control and cardiac health.;Identified with patient nutritional and/or medication changes necessary with exercise.;Helped patient identify appropriate exercises in relation to his/her diabetes, diabetes complications and other health issue.  Monitoring Purpose and frequency of SMBG.;Taught/discussed recording of test results and interpretation of SMBG.;Yearly dilated eye exam;Identified appropriate SMBG  and/or A1C goals.;Daily foot exams   Acute complications Taught treatment of hypoglycemia - the 15 rule.   Psychosocial adjustment Worked with patient to identify barriers to care and solutions;Role of stress on diabetes;Travel strategies;Helped patient identify a support system for diabetes management   Individualized Goals (developed by patient)   Nutrition Follow meal plan discussed;General guidelines for healthy choices and portions discussed;Adjust meds/carbs with exercise as discussed   Physical Activity Exercise 5-7 days per week;30 minutes per day   Monitoring  test blood glucose pre and post meals as discussed   Reducing Risk treat hypoglycemia with 15 grams of carbs if blood glucose less than 70mg /dL;increase portions of nuts and seeds;increase portions of olive oil in diet;do foot checks daily   Post-Education Assessment   Patient understands the diabetes disease and treatment process. Demonstrates understanding / competency   Patient understands incorporating nutritional management into lifestyle. Demonstrates understanding / competency   Patient undertands incorporating physical activity into lifestyle. Demonstrates understanding / competency   Patient understands using medications safely. Demonstrates understanding / competency   Patient understands monitoring blood glucose, interpreting and using results Demonstrates understanding / competency   Patient understands prevention, detection, and treatment of acute complications. Demonstrates understanding / competency   Patient understands prevention, detection, and treatment of chronic complications. Demonstrates understanding / competency   Patient understands how to develop strategies to address psychosocial issues. Demonstrates understanding / competency   Patient understands how to develop strategies to promote health/change behavior. Demonstrates understanding / competency   Outcomes   Expected Outcomes Demonstrated interest in  learning. Expect positive outcomes   Future DMSE PRN   Program Status Completed      Individualized Plan for Diabetes Self-Management Training:   Learning Objective:  Patient will have a greater understanding of diabetes self-management. Patient education plan is to attend individual and/or group sessions per assessed needs and concerns.   Plan:   There are no Patient Instructions on file for this visit.  Expected Outcomes:  Demonstrated interest in learning. Expect positive outcomes  Education material provided: Living Well with Diabetes, My Plate and Snack sheet  If problems or questions, patient to contact team via:  Phone  Future DSME appointment: PRN

## 2016-01-27 ENCOUNTER — Encounter: Payer: Self-pay | Admitting: Family Medicine

## 2016-04-25 ENCOUNTER — Other Ambulatory Visit (HOSPITAL_COMMUNITY)
Admission: RE | Admit: 2016-04-25 | Discharge: 2016-04-25 | Disposition: A | Discharge status: Home / Self Care | Payer: Medicaid Other | Source: Ambulatory Visit | Attending: Family Medicine | Admitting: Family Medicine

## 2016-04-25 ENCOUNTER — Other Ambulatory Visit: Payer: Self-pay | Admitting: Family Medicine

## 2016-04-25 DIAGNOSIS — Z113 Encounter for screening for infections with a predominantly sexual mode of transmission: Secondary | ICD-10-CM | POA: Insufficient documentation

## 2016-04-25 DIAGNOSIS — N760 Acute vaginitis: Secondary | ICD-10-CM | POA: Diagnosis present

## 2016-04-25 DIAGNOSIS — Z1151 Encounter for screening for human papillomavirus (HPV): Secondary | ICD-10-CM | POA: Insufficient documentation

## 2016-04-25 DIAGNOSIS — Z01419 Encounter for gynecological examination (general) (routine) without abnormal findings: Secondary | ICD-10-CM | POA: Insufficient documentation

## 2016-04-26 LAB — CYTOLOGY - PAP

## 2016-09-04 ENCOUNTER — Encounter (HOSPITAL_COMMUNITY): Payer: Self-pay | Admitting: Emergency Medicine

## 2016-09-04 ENCOUNTER — Inpatient Hospital Stay (HOSPITAL_COMMUNITY)
Admission: EM | Admit: 2016-09-04 | Discharge: 2016-09-06 | DRG: 292 | Disposition: A | Discharge status: Home / Self Care | Payer: Medicaid Other | Attending: Family Medicine | Admitting: Family Medicine

## 2016-09-04 ENCOUNTER — Emergency Department (HOSPITAL_COMMUNITY): Payer: Medicaid Other

## 2016-09-04 DIAGNOSIS — I509 Heart failure, unspecified: Secondary | ICD-10-CM | POA: Diagnosis not present

## 2016-09-04 DIAGNOSIS — I110 Hypertensive heart disease with heart failure: Principal | ICD-10-CM | POA: Diagnosis present

## 2016-09-04 DIAGNOSIS — E784 Other hyperlipidemia: Secondary | ICD-10-CM | POA: Diagnosis not present

## 2016-09-04 DIAGNOSIS — Z6841 Body Mass Index (BMI) 40.0 and over, adult: Secondary | ICD-10-CM | POA: Diagnosis not present

## 2016-09-04 DIAGNOSIS — Z794 Long term (current) use of insulin: Secondary | ICD-10-CM

## 2016-09-04 DIAGNOSIS — Z91012 Allergy to eggs: Secondary | ICD-10-CM

## 2016-09-04 DIAGNOSIS — Z91018 Allergy to other foods: Secondary | ICD-10-CM | POA: Diagnosis not present

## 2016-09-04 DIAGNOSIS — I10 Essential (primary) hypertension: Secondary | ICD-10-CM | POA: Diagnosis present

## 2016-09-04 DIAGNOSIS — Z9049 Acquired absence of other specified parts of digestive tract: Secondary | ICD-10-CM | POA: Diagnosis not present

## 2016-09-04 DIAGNOSIS — G894 Chronic pain syndrome: Secondary | ICD-10-CM | POA: Diagnosis not present

## 2016-09-04 DIAGNOSIS — F419 Anxiety disorder, unspecified: Secondary | ICD-10-CM | POA: Diagnosis present

## 2016-09-04 DIAGNOSIS — E1140 Type 2 diabetes mellitus with diabetic neuropathy, unspecified: Secondary | ICD-10-CM | POA: Diagnosis not present

## 2016-09-04 DIAGNOSIS — F329 Major depressive disorder, single episode, unspecified: Secondary | ICD-10-CM | POA: Diagnosis present

## 2016-09-04 DIAGNOSIS — Z79899 Other long term (current) drug therapy: Secondary | ICD-10-CM

## 2016-09-04 DIAGNOSIS — G4733 Obstructive sleep apnea (adult) (pediatric): Secondary | ICD-10-CM | POA: Diagnosis present

## 2016-09-04 DIAGNOSIS — H409 Unspecified glaucoma: Secondary | ICD-10-CM | POA: Diagnosis present

## 2016-09-04 DIAGNOSIS — Z87891 Personal history of nicotine dependence: Secondary | ICD-10-CM

## 2016-09-04 DIAGNOSIS — M549 Dorsalgia, unspecified: Secondary | ICD-10-CM | POA: Diagnosis present

## 2016-09-04 DIAGNOSIS — G8929 Other chronic pain: Secondary | ICD-10-CM | POA: Diagnosis present

## 2016-09-04 DIAGNOSIS — R0602 Shortness of breath: Secondary | ICD-10-CM | POA: Diagnosis not present

## 2016-09-04 DIAGNOSIS — R001 Bradycardia, unspecified: Secondary | ICD-10-CM | POA: Diagnosis not present

## 2016-09-04 DIAGNOSIS — E119 Type 2 diabetes mellitus without complications: Secondary | ICD-10-CM | POA: Diagnosis present

## 2016-09-04 DIAGNOSIS — E1122 Type 2 diabetes mellitus with diabetic chronic kidney disease: Secondary | ICD-10-CM

## 2016-09-04 DIAGNOSIS — IMO0002 Reserved for concepts with insufficient information to code with codable children: Secondary | ICD-10-CM

## 2016-09-04 DIAGNOSIS — J449 Chronic obstructive pulmonary disease, unspecified: Secondary | ICD-10-CM | POA: Diagnosis present

## 2016-09-04 DIAGNOSIS — E785 Hyperlipidemia, unspecified: Secondary | ICD-10-CM | POA: Diagnosis present

## 2016-09-04 DIAGNOSIS — Z91011 Allergy to milk products: Secondary | ICD-10-CM

## 2016-09-04 DIAGNOSIS — I5031 Acute diastolic (congestive) heart failure: Secondary | ICD-10-CM | POA: Diagnosis present

## 2016-09-04 LAB — CBC WITH DIFFERENTIAL/PLATELET
Basophils Absolute: 0 10*3/uL (ref 0.0–0.1)
Basophils Relative: 0 %
Eosinophils Absolute: 0.2 10*3/uL (ref 0.0–0.7)
Eosinophils Relative: 2 %
HEMATOCRIT: 34.6 % — AB (ref 36.0–46.0)
Hemoglobin: 10.9 g/dL — ABNORMAL LOW (ref 12.0–15.0)
LYMPHS ABS: 1.2 10*3/uL (ref 0.7–4.0)
LYMPHS PCT: 17 %
MCH: 25 pg — ABNORMAL LOW (ref 26.0–34.0)
MCHC: 31.5 g/dL (ref 30.0–36.0)
MCV: 79.4 fL (ref 78.0–100.0)
MONO ABS: 0.5 10*3/uL (ref 0.1–1.0)
MONOS PCT: 6 %
NEUTROS ABS: 5.3 10*3/uL (ref 1.7–7.7)
NEUTROS PCT: 74 %
Platelets: 173 10*3/uL (ref 150–400)
RBC: 4.36 MIL/uL (ref 3.87–5.11)
RDW: 16 % — AB (ref 11.5–15.5)
WBC: 7.2 10*3/uL (ref 4.0–10.5)

## 2016-09-04 LAB — BASIC METABOLIC PANEL
Anion gap: 7 (ref 5–15)
BUN: 23 mg/dL — ABNORMAL HIGH (ref 6–20)
CALCIUM: 8.9 mg/dL (ref 8.9–10.3)
CHLORIDE: 111 mmol/L (ref 101–111)
CO2: 24 mmol/L (ref 22–32)
CREATININE: 1.38 mg/dL — AB (ref 0.44–1.00)
GFR calc Af Amer: 47 mL/min — ABNORMAL LOW (ref >60)
GFR calc non Af Amer: 41 mL/min — ABNORMAL LOW (ref >60)
GLUCOSE: 137 mg/dL — AB (ref 65–99)
Potassium: 4.9 mmol/L (ref 3.5–5.1)
Sodium: 142 mmol/L (ref 135–145)

## 2016-09-04 LAB — TROPONIN I
TROPONIN I: 0.03 ng/mL — AB (ref <0.03)
TROPONIN I: 0.03 ng/mL — AB (ref <0.03)

## 2016-09-04 LAB — BRAIN NATRIURETIC PEPTIDE: B Natriuretic Peptide: 152.8 pg/mL — ABNORMAL HIGH (ref 0.0–100.0)

## 2016-09-04 LAB — GLUCOSE, CAPILLARY: Glucose-Capillary: 159 mg/dL — ABNORMAL HIGH (ref 65–99)

## 2016-09-04 MED ORDER — SODIUM CHLORIDE 0.9% FLUSH
3.0000 mL | Freq: Two times a day (BID) | INTRAVENOUS | Status: DC
  Administered 2016-09-04 – 2016-09-06 (×4): 3 mL via INTRAVENOUS

## 2016-09-04 MED ORDER — SODIUM CHLORIDE 0.9% FLUSH: 3.0000 mL | INTRAVENOUS | Status: DC | PRN

## 2016-09-04 MED ORDER — SODIUM CHLORIDE 0.9 % IV SOLN: 250.0000 mL | INTRAVENOUS | Status: DC | PRN

## 2016-09-04 MED ORDER — INSULIN GLARGINE 100 UNIT/ML ~~LOC~~ SOLN
40.0000 [IU] | Freq: Every day | SUBCUTANEOUS | Status: DC
  Administered 2016-09-04: 40 [IU] via SUBCUTANEOUS
  Filled 2016-09-04: qty 0.4

## 2016-09-04 MED ORDER — ONDANSETRON HCL 4 MG/2ML IJ SOLN: 4.0000 mg | Freq: Four times a day (QID) | INTRAMUSCULAR | Status: DC | PRN

## 2016-09-04 MED ORDER — INSULIN ASPART 100 UNIT/ML ~~LOC~~ SOLN
0.0000 [IU] | Freq: Three times a day (TID) | SUBCUTANEOUS | Status: DC
  Administered 2016-09-06 (×2): 3 [IU] via SUBCUTANEOUS

## 2016-09-04 MED ORDER — CANAGLIFLOZIN 100 MG PO TABS
100.0000 mg | ORAL_TABLET | Freq: Every day | ORAL | Status: DC
  Administered 2016-09-05: 100 mg via ORAL
  Filled 2016-09-04 (×2): qty 1

## 2016-09-04 MED ORDER — FUROSEMIDE 10 MG/ML IJ SOLN
60.0000 mg | Freq: Once | INTRAMUSCULAR | Status: AC
  Administered 2016-09-04: 60 mg via INTRAVENOUS
  Filled 2016-09-04: qty 8

## 2016-09-04 MED ORDER — INSULIN ASPART 100 UNIT/ML ~~LOC~~ SOLN
0.0000 [IU] | Freq: Every day | SUBCUTANEOUS | Status: DC
Start: 2016-09-04 — End: 2016-09-07

## 2016-09-04 MED ORDER — FUROSEMIDE 10 MG/ML IJ SOLN
20.0000 mg | Freq: Every day | INTRAMUSCULAR | Status: DC
  Administered 2016-09-05 – 2016-09-06 (×2): 20 mg via INTRAVENOUS
  Filled 2016-09-04 (×2): qty 2

## 2016-09-04 MED ORDER — LATANOPROST 0.005 % OP SOLN
1.0000 [drp] | Freq: Every day | OPHTHALMIC | Status: DC
  Administered 2016-09-04 – 2016-09-05 (×2): 1 [drp] via OPHTHALMIC
  Filled 2016-09-04: qty 2.5

## 2016-09-04 MED ORDER — ENOXAPARIN SODIUM 40 MG/0.4ML ~~LOC~~ SOLN
40.0000 mg | SUBCUTANEOUS | Status: DC
  Administered 2016-09-04 – 2016-09-05 (×2): 40 mg via SUBCUTANEOUS
  Filled 2016-09-04 (×2): qty 0.4

## 2016-09-04 MED ORDER — SODIUM CHLORIDE 0.9 % IV SOLN
INTRAVENOUS | Status: DC
  Administered 2016-09-04: 10 mL/h via INTRAVENOUS

## 2016-09-04 MED ORDER — POTASSIUM CHLORIDE CRYS ER 10 MEQ PO TBCR
10.0000 meq | EXTENDED_RELEASE_TABLET | Freq: Two times a day (BID) | ORAL | Status: DC
  Administered 2016-09-04 – 2016-09-06 (×4): 10 meq via ORAL
  Filled 2016-09-04 (×4): qty 1

## 2016-09-04 MED ORDER — DOXAZOSIN MESYLATE 4 MG PO TABS
4.0000 mg | ORAL_TABLET | Freq: Every day | ORAL | Status: DC
  Administered 2016-09-04 – 2016-09-05 (×2): 4 mg via ORAL
  Filled 2016-09-04 (×3): qty 1

## 2016-09-04 MED ORDER — DORZOLAMIDE HCL-TIMOLOL MAL 2-0.5 % OP SOLN
1.0000 [drp] | Freq: Two times a day (BID) | OPHTHALMIC | Status: DC
  Administered 2016-09-04 – 2016-09-06 (×4): 1 [drp] via OPHTHALMIC
  Filled 2016-09-04: qty 10

## 2016-09-04 MED ORDER — ACETAMINOPHEN 325 MG PO TABS
650.0000 mg | ORAL_TABLET | ORAL | Status: DC | PRN
  Administered 2016-09-05 – 2016-09-06 (×3): 650 mg via ORAL
  Filled 2016-09-04 (×3): qty 2

## 2016-09-04 MED ORDER — AMLODIPINE BESYLATE 5 MG PO TABS
10.0000 mg | ORAL_TABLET | Freq: Every day | ORAL | Status: DC
  Administered 2016-09-05 – 2016-09-06 (×2): 10 mg via ORAL
  Filled 2016-09-04 (×2): qty 2

## 2016-09-04 NOTE — ED Triage Notes (Signed)
Patient states that she having SOB for about a month and having edema in bilat legs for while too but last night started had weeping from legs.

## 2016-09-04 NOTE — ED Notes (Signed)
Output=760ml

## 2016-09-04 NOTE — Progress Notes (Signed)
RT applied CPAP of 8cmH2O via [facility] per patient that is what she wears at home. Patient showed that she was able to take the mask on and off. RT will continue to monitor.

## 2016-09-04 NOTE — H&P (Signed)
History and Physical    [name] [id number] DOB: 1956-04-25 DOA: 09/04/2016  PCP: Elyn Peers, MD  Patient coming from: Home  Chief Complaint: Fluid in legs, dyspnea  HPI: [name] is a 60 y.o. female with medical history significant of hypertension, glaucoma, diabetes mellitus that presents with leg swelling and weeping that has been present for quite a bit of time.  She states that she then developed shortness of breath about a month ago and, thinking it was asthma, she started using albuterol again.  The albuterol did not help and her dyspnea persisted.   Dyspnea with walking from one room to another, has not been able to lay down flat and has been sleeping in a recliner.  Has a CPAP but hasn't been able to use it as its been broken for quite some time.  Finally today when she began to get short of breath just sitting in her chair she decided she needed to be evaluated at the hospital.    Patient does not have any diagnosis of CHF previously and had a stress test a few years ago.  She states she attempted to get the fluid off herself at home by doubling her HCTZ.  After a few days she decided against this.  She lives at home and has home health aides come and help her.  She ambulates with a walker when she can walk and isn't short of breath.  ED Course: Patient seen and evaluated. BNP elevated at 152.8, troponin of 0.03, Cr of 1.38 (slightly elevated above her previous value 10 months ago of 1.2).  She is not on oxygen and breathing comfortably.  She was given IV lasix 60mg  x 1.  She reports significant urination after that 1 dose and is starting to feel a slight improvement in her work of breathing.  TRH asked to admit for acute CHF exacerbation.  Review of Systems: As per HPI otherwise 10 point review of systems negative.  No fevers, chills, headaches, no changes in her vision (at baseline patient has glaucoma but no change from her baseline), no chest pain, chest pressure, no  nausea, vomiting or diarrhea.  No hematuria or dysuria.  Past Medical History:  Diagnosis Date  . Achilles rupture   . Anxiety   . Arthritis   . Asthma   . Chronic back pain   . Chronic headaches   . Chronic leg pain    bilaterally  . Depression   . Diabetes mellitus 1999   T2DM  . Glaucoma   . H/O gestational diabetes mellitus, not currently pregnant   . Heart murmur   . Hyperlipidemia   . Hypertension   . Obesity   . OSA (obstructive sleep apnea)   . Sciatica     Past Surgical History:  Procedure Laterality Date  . BACK SURGERY    . BREAST LUMPECTOMY     Right breast  . CERVICAL SPINE SURGERY    . CESAREAN SECTION  12/10/89  . CHOLECYSTECTOMY  1991  . LUMBAR SPINE SURGERY    . TUBAL LIGATION       reports that she has quit smoking. Her smoking use included Cigarettes. She does not have any smokeless tobacco history on file. She reports that she drinks alcohol. She reports that she does not use drugs.  Allergies  Allergen Reactions  . Eggs Or Egg-Derived Products Nausea And Vomiting  . Milk-Related Compounds Nausea And Vomiting  . Pork-Derived Products Nausea And Vomiting    No family  history on file.   Prior to Admission medications   Medication Sig Start Date End Date Taking? Authorizing Provider  albuterol (PROVENTIL HFA;VENTOLIN HFA) 108 (90 BASE) MCG/ACT inhaler Inhale 2 puffs into the lungs every 6 (six) hours as needed for wheezing or shortness of breath. For shortness of breath   Yes Robyn Haber, MD  amLODipine (NORVASC) 10 MG tablet Take 10 mg by mouth daily.   Yes Historical Provider, MD  carvedilol (COREG) 25 MG tablet Take 25 mg by mouth 2 (two) times daily with a meal.   Yes Historical Provider, MD  dorzolamide-timolol (COSOPT) 22.3-6.8 MG/ML ophthalmic solution Place 1 drop into both eyes 2 (two) times daily.   Yes Historical Provider, MD  doxazosin (CARDURA) 4 MG tablet Take 4 mg by mouth at bedtime.   Yes Historical Provider, MD    empagliflozin (JARDIANCE) 25 MG TABS tablet Take 25 mg by mouth at bedtime.   Yes Historical Provider, MD  hydrochlorothiazide (HYDRODIURIL) 12.5 MG tablet Take 12.5 mg by mouth daily.   Yes Historical Provider, MD  ibuprofen (ADVIL,MOTRIN) 200 MG tablet Take 400-800 mg by mouth every 6 (six) hours as needed for headache, mild pain or moderate pain.    Yes Historical Provider, MD  insulin aspart (NOVOLOG FLEXPEN) 100 UNIT/ML FlexPen Inject 3-18 Units into the skin 3 (three) times daily with meals as needed for high blood sugar. Pt uses as needed per sliding scale.   Yes Historical Provider, MD  insulin glargine (LANTUS) 100 unit/mL SOPN Inject 40 Units into the skin at bedtime.   Yes Historical Provider, MD  Travoprost, BAK Free, (TRAVATAN) 0.004 % SOLN ophthalmic solution Place 1 drop into both eyes at bedtime.   Yes Historical Provider, MD    Physical Exam: Vitals:   09/04/16 1330 09/04/16 1415 09/04/16 1430 09/04/16 1500  BP: (!) 161/48 (!) 159/146 155/58 173/80  Pulse: (!) 53 (!) 49 (!) 47 (!) 49  Resp:   18 19  Temp:      TempSrc:      SpO2: 99% 100% 100% 100%      Constitutional: NAD, calm, comfortable Vitals:   09/04/16 1330 09/04/16 1415 09/04/16 1430 09/04/16 1500  BP: (!) 161/48 (!) 159/146 155/58 173/80  Pulse: (!) 53 (!) 49 (!) 47 (!) 49  Resp:   18 19  Temp:      TempSrc:      SpO2: 99% 100% 100% 100%   Eyes: PERRL, lids and conjunctivae normal ENMT: Mucous membranes are moist. Posterior pharynx clear of any exudate or lesions.Normal dentition.  Neck: normal, supple, no masses, no thyromegaly Respiratory: fine rales at bases bilaterally, no wheezing. Normal respiratory effort. No accessory muscle use.  Cardiovascular: Regular rate and rhythm, no murmurs / rubs / gallops. Edema to mid thigh bilaterally. 2+ pedal pulses. No carotid bruits.  Abdomen: obese, no tenderness, no masses palpated. No hepatosplenomegaly. Bowel sounds positive.  Musculoskeletal: no clubbing /  cyanosis. No joint deformity upper and lower extremities. Good ROM, no contractures. Normal muscle tone.  Skin: no rashes or ulcers. No induration.  Few small lesions on posterior aspect of the right leg Neurologic: CN 2-12 grossly intact. Sensation intact, DTR normal. Strength 5/5 in all 4.  Psychiatric: Normal judgment and insight. Alert and oriented x 3. Normal mood.     Labs on Admission: I have personally reviewed following labs and imaging studies  CBC:  Recent Labs Lab 09/04/16 1258  WBC 7.2  NEUTROABS 5.3  HGB 10.9*  HCT  34.6*  MCV 79.4  PLT [id number]   Basic Metabolic Panel:  Recent Labs Lab 09/04/16 1258  NA 142  K 4.9  CL 111  CO2 24  GLUCOSE 137*  BUN 23*  CREATININE 1.38*  CALCIUM 8.9   GFR: CrCl cannot be calculated (Unknown ideal weight.). Liver Function Tests: No results for input(s): AST, ALT, ALKPHOS, BILITOT, PROT, ALBUMIN in the last 168 hours. No results for input(s): LIPASE, AMYLASE in the last 168 hours. No results for input(s): AMMONIA in the last 168 hours. Coagulation Profile: No results for input(s): INR, PROTIME in the last 168 hours. Cardiac Enzymes:  Recent Labs Lab 09/04/16 1258  TROPONINI 0.03*   BNP (last 3 results) No results for input(s): PROBNP in the last 8760 hours. HbA1C: No results for input(s): HGBA1C in the last 72 hours. CBG: No results for input(s): GLUCAP in the last 168 hours. Lipid Profile: No results for input(s): CHOL, HDL, LDLCALC, TRIG, CHOLHDL, LDLDIRECT in the last 72 hours. Thyroid Function Tests: No results for input(s): TSH, T4TOTAL, FREET4, T3FREE, THYROIDAB in the last 72 hours. Anemia Panel: No results for input(s): VITAMINB12, FOLATE, FERRITIN, TIBC, IRON, RETICCTPCT in the last 72 hours. Urine analysis:    Component Value Date/Time   COLORURINE YELLOW 11/09/2015 [place] 11/09/2015 1716   LABSPEC 1.040 (H) 11/09/2015 1716   PHURINE 6.0 11/09/2015 1716   GLUCOSEU >1000 (A)  11/09/2015 1716   HGBUR MODERATE (A) 11/09/2015 1716   HGBUR negative 08/20/2009 0807   BILIRUBINUR NEGATIVE 11/09/2015 1716   BILIRUBINUR neg 05/14/2013 1111   KETONESUR NEGATIVE 11/09/2015 1716   PROTEINUR >300 (A) 11/09/2015 1716   UROBILINOGEN 1.0 11/11/2014 1215   NITRITE NEGATIVE 11/09/2015 1716   LEUKOCYTESUR NEGATIVE 11/09/2015 1716   Sepsis Labs: !!!!!!!!!!!!!!!!!!!!!!!!!!!!!!!!!!!!!!!!!!!! @LABRCNTIP (procalcitonin:4,lacticidven:4) )No results found for this or any previous visit (from the past 240 hour(s)).   Radiological Exams on Admission: Dg Chest 2 View  Result Date: 09/04/2016 CLINICAL DATA:  Shortness of breath for the past 5 weeks with increased symptoms over the past 2 weeks associated with mid chest tightness and cough and peripheral edema. History of asthma, diabetes, hypertension, obesity, former smoker. EXAM: CHEST  2 VIEW COMPARISON:  PA and lateral chest x-ray of October 08, 2015 FINDINGS: The lungs are adequately inflated. There is no focal infiltrate. The cardiac silhouette is enlarged. The pulmonary vascularity is mildly engorged. The interstitial markings are slightly increased but this is not new. There is calcification in the wall of the aortic arch. There is no pleural effusion. The bony thorax is unremarkable. IMPRESSION: Cardiomegaly with central pulmonary vascular congestion may reflect low-grade CHF. There is no alveolar pneumonia. Thoracic aortic atherosclerosis. Electronically Signed   By: David  Martinique M.D.   On: 09/04/2016 12:32    EKG: Independently reviewed. Sinus rhythm with poor R wave progression  Assessment/Plan Active Problems:   Diabetes mellitus (HCC)   Hyperlipidemia   OSA (obstructive sleep apnea)   Glaucoma   Hypertension    Acute Congestive Heart Failure - new diagnosis - elevated BNP - h/o OSA - marked edema to mid thighs bilaterally - CXR with some pulmonary edema - 60mg  IV lasix x 1 given in ED - will order 20mg  IV lasix  daily starting tomorrow - heart failure pathway - strict I/O's - daily weights - echocardiogram ordered as patient has never had one - hold BB during acute exacerbation - repeat BMP in am  OSA - CPAP ordered qhs  Glaucoma - continue patient's  home eye drops  HTN - continue amlodipine and HCTZ - holding metoprolol due to acute CHF exacerbation  Diabetes - continue lantus 40 units qhs - will order SSI for patient   DVT prophylaxis: lovenox  Code Status: Full Code Family Communication:  No family bedside  Disposition Plan: discharge back to previous environment when improved and diuresed  Consults called: None  Admission status: Inpatient to telemetry   Loretha Stapler MD [facility] Pager [phone]  If 7PM-7AM, please contact night-coverage [email or web address] Password [facility]  09/04/2016, 3:27 PM

## 2016-09-04 NOTE — ED Notes (Signed)
Output = 651ml

## 2016-09-04 NOTE — ED Notes (Signed)
Disregard vitals for 1415

## 2016-09-04 NOTE — ED Notes (Signed)
Spoke with 4th floor. Will take patient up in 10-15 minutes.

## 2016-09-04 NOTE — ED Notes (Signed)
Charge nurse on [place] stated to being the patient up at [place].

## 2016-09-04 NOTE — ED Provider Notes (Signed)
[facility] DEPT Provider Note   CSN: [id number] Arrival date & time: 09/04/16  1050     History   Chief Complaint Chief Complaint  Patient presents with  . Shortness of Breath  . weeping legs    HPI [name] is a 60 y.o. female.  60 y/o female here with bilateral le edema that's been chronic and worsening No pleuritic pain Now w/ associated dyspnea on exertion and orthopnea Cough w/o fever and non-productive Denies chest pain or pressure Denies prior h/o chf Sx better w/ rest and no prior h/o same and no tx used pta       Past Medical History:  Diagnosis Date  . Achilles rupture   . Anxiety   . Arthritis   . Asthma   . Chronic back pain   . Chronic headaches   . Chronic leg pain    bilaterally  . Depression   . Diabetes mellitus 1999   T2DM  . Glaucoma   . H/O gestational diabetes mellitus, not currently pregnant   . Heart murmur   . Hyperlipidemia   . Hypertension   . Obesity   . OSA (obstructive sleep apnea)   . Sciatica     Patient Active Problem List   Diagnosis Date Noted  . POSTMENOPAUSAL BLEEDING 05/09/2010  . ANEMIA 05/05/2010  . DEPRESSION 04/26/2010  . TMJ SYNDROME 04/26/2010  . DEGENERATIVE DISC DISEASE, CERVICAL SPINE 04/26/2010  . CHEST PAIN 04/26/2010  . CHRONIC OBSTRUCTIVE PULMONARY DISEASE, MODERATE 03/26/2010  . MASTALGIA 10/08/2009  . ANEMIA, IRON DEFICIENCY 07/12/2009  . DIABETES MELLITUS, TYPE II, ON INSULIN 07/02/2009  . HYPERLIPIDEMIA 07/02/2009  . OBESITY 07/02/2009  . SLEEP APNEA, OBSTRUCTIVE 07/02/2009  . GLAUCOMA 07/02/2009  . HYPERTENSION, ESSENTIAL 07/02/2009  . ASTHMA 07/02/2009  . LOW BACK PAIN SYNDROME 07/02/2009  . BACK PAIN 07/02/2009  . PERIPHERAL EDEMA 07/02/2009  . GROIN PAIN 07/02/2009  . TOBACCO USE, QUIT 07/02/2009  . HEMORRHOIDS, INTERNAL 07/16/2008  . DIVERTICULOSIS OF COLON 07/16/2008    Past Surgical History:  Procedure Laterality Date  . BACK SURGERY    . BREAST LUMPECTOMY     Right breast  . CERVICAL SPINE SURGERY    . CESAREAN SECTION  12/10/89  . CHOLECYSTECTOMY  1991  . LUMBAR SPINE SURGERY    . TUBAL LIGATION      OB History    Gravida Para Term Preterm AB Living   1 1       1    SAB TAB Ectopic Multiple Live Births                   Home Medications    Prior to Admission medications   Medication Sig Start Date End Date Taking? Authorizing Provider  acetaminophen (TYLENOL) 500 MG tablet Take 1,000 mg by mouth every 6 (six) hours as needed for moderate pain.    Historical Provider, MD  albuterol (PROVENTIL HFA;VENTOLIN HFA) 108 (90 BASE) MCG/ACT inhaler Inhale 2-3 puffs into the lungs every 6 (six) hours as needed for wheezing or shortness of breath. For shortness of breath 02/11/13   Robyn Haber, MD  amLODipine (NORVASC) 10 MG tablet Take 10 mg by mouth daily.    Historical Provider, MD  celecoxib (CELEBREX) 200 MG capsule Take 200 mg by mouth daily.    Historical Provider, MD  docusate sodium (COLACE) 100 MG capsule Take 1 capsule (100 mg total) by mouth every 12 (twelve) hours. Patient not taking: Reported on 09/24/2015 11/11/14   Orlie Dakin,  MD  Dorzolamide HCl-Timolol Mal PF 22.3-6.8 MG/ML SOLN Place 1 drop into both eyes 2 (two) times daily.    Historical Provider, MD  doxazosin (CARDURA) 4 MG tablet Take 4 mg by mouth at bedtime.    Historical Provider, MD  empagliflozin (JARDIANCE) 10 MG TABS tablet Take 10 mg by mouth daily.    Historical Provider, MD  gabapentin (NEURONTIN) 300 MG capsule Take 1 capsule (300 mg total) by mouth daily. Patient not taking: Reported on 02/23/2015 04/08/14   Noland Fordyce, PA-C  ibuprofen (ADVIL,MOTRIN) 200 MG tablet Take 400 mg by mouth every 6 (six) hours as needed for moderate pain.    Historical Provider, MD  insulin aspart (NOVOLOG) 100 UNIT/ML injection Inject into the skin 3 (three) times daily with meals. Per sliding scale  141-180= 3 units 181-220= 6 units 221-260= 9 units 261-300= 12 units 301-340=  15 units >341= 18 units    Historical Provider, MD  insulin glargine (LANTUS) 100 UNIT/ML injection Inject 30 Units into the skin at bedtime.    Historical Provider, MD  lactose free nutrition (BOOST) LIQD Take 237 mLs by mouth every other day. As needed-- lack of appetite.    Historical Provider, MD  metFORMIN (GLUCOPHAGE) 1000 MG tablet Take 1 tablet (1,000 mg total) by mouth 2 (two) times daily. PATIENT NEEDS OFFICE VISIT FOR ADDITIONAL REFILLS Patient not taking: Reported on 09/24/2015 02/16/14   Robyn Haber, MD  metoCLOPramide (REGLAN) 10 MG tablet Take 1 tablet (10 mg total) by mouth every 6 (six) hours as needed for nausea. Patient not taking: Reported on 09/24/2015 11/11/14   Orlie Dakin, MD  metoprolol succinate (TOPROL-XL) 50 MG 24 hr tablet Take 50 mg by mouth daily. Take with or immediately following a meal.    Historical Provider, MD  ondansetron (ZOFRAN) 4 MG tablet Take 4 mg by mouth every 6 (six) hours as needed. for nausea 11/08/15   Historical Provider, MD  oxyCODONE-acetaminophen (PERCOCET/ROXICET) 5-325 MG per tablet Take 1 tablet by mouth every 6 (six) hours as needed. Patient not taking: Reported on 09/24/2015 06/24/14   Kristen N Ward, DO  pregabalin (LYRICA) 75 MG capsule Take 1 capsule (75 mg total) by mouth daily. Patient not taking: Reported on 02/23/2015 06/24/14   Delice Bison Ward, DO  promethazine (PHENERGAN) 25 MG tablet Take 1 tablet (25 mg total) by mouth every 6 (six) hours as needed for nausea or vomiting. 11/09/15   Clayton Bibles, PA-C  traMADol (ULTRAM) 50 MG tablet Take by mouth every 6 (six) hours as needed for moderate pain or severe pain.     Historical Provider, MD  travoprost, benzalkonium, (TRAVATAN) 0.004 % ophthalmic solution Place 1 drop into both eyes at bedtime.    Historical Provider, MD    Family History No family history on file.  Social History Social History  Substance Use Topics  . Smoking status: Former Smoker    Types: Cigarettes  . Smokeless  tobacco: Not on file  . Alcohol use Yes     Comment: occasional     Allergies   Eggs or egg-derived products; Milk-related compounds; and Pork-derived products   Review of Systems Review of Systems  All other systems reviewed and are negative.    Physical Exam Updated Vital Signs BP 180/68 (BP Location: Right Arm)   Pulse (!) 52   Temp 98.2 F (36.8 C) (Oral)   Resp 22   SpO2 99%   Physical Exam  Constitutional: She is oriented to person, place, and  time. She appears well-developed and well-nourished.  Non-toxic appearance. No distress.  HENT:  Head: Normocephalic and atraumatic.  Eyes: Conjunctivae, EOM and lids are normal. Pupils are equal, round, and reactive to light.  Neck: Normal range of motion. Neck supple. No tracheal deviation present. No thyroid mass present.  Cardiovascular: Normal rate, regular rhythm and normal heart sounds.  Exam reveals no gallop.   No murmur heard. Pulmonary/Chest: Effort normal. No stridor. No respiratory distress. She has no decreased breath sounds. She has no wheezes. She has no rhonchi. She has rales in the right lower field and the left lower field.  Abdominal: Soft. Normal appearance and bowel sounds are normal. She exhibits no distension. There is no tenderness. There is no rebound and no CVA tenderness.  Musculoskeletal: Normal range of motion. She exhibits no edema or tenderness.  Lymphadenopathy:  3 plus bilateral le edema  Neurological: She is alert and oriented to person, place, and time. She has normal strength. No cranial nerve deficit or sensory deficit. GCS eye subscore is 4. GCS verbal subscore is 5. GCS motor subscore is 6.  Skin: Skin is warm and dry. No abrasion and no rash noted.  Psychiatric: She has a normal mood and affect. Her speech is normal and behavior is normal.  Nursing note and vitals reviewed.    ED Treatments / Results  Labs (all labs ordered are listed, but only abnormal results are displayed) Labs  Reviewed - No data to display  EKG  EKG Interpretation None       Radiology No results found.  Procedures Procedures (including critical care time)  Medications Ordered in ED Medications - No data to display   Initial Impression / Assessment and Plan / ED Course  I have reviewed the triage vital signs and the nursing notes.  Pertinent labs & imaging results that were available during my care of the patient were reviewed by me and considered in my medical decision making (see chart for details).  Clinical Course     Patient with evidence of CHF on her chest x-rays. Slight troponin bump at 0.03 noted. Her EKG does not show any signs of ACS. Given Lasix 60 mg IV push. Will be admitted to the hospitalist service.  Final Clinical Impressions(s) / ED Diagnoses   Final diagnoses:  None    New Prescriptions New Prescriptions   No medications on file     Lacretia Leigh, MD 09/04/16 1450

## 2016-09-04 NOTE — ED Notes (Signed)
Contacted bed placement, advised they would page admitting to ask for the order for bed request.

## 2016-09-05 ENCOUNTER — Inpatient Hospital Stay (HOSPITAL_COMMUNITY): Payer: Medicaid Other

## 2016-09-05 DIAGNOSIS — E784 Other hyperlipidemia: Secondary | ICD-10-CM

## 2016-09-05 DIAGNOSIS — I509 Heart failure, unspecified: Secondary | ICD-10-CM

## 2016-09-05 DIAGNOSIS — G894 Chronic pain syndrome: Secondary | ICD-10-CM

## 2016-09-05 LAB — BASIC METABOLIC PANEL
ANION GAP: 6 (ref 5–15)
BUN: 28 mg/dL — ABNORMAL HIGH (ref 6–20)
CHLORIDE: 110 mmol/L (ref 101–111)
CO2: 25 mmol/L (ref 22–32)
CREATININE: 1.44 mg/dL — AB (ref 0.44–1.00)
Calcium: 8.6 mg/dL — ABNORMAL LOW (ref 8.9–10.3)
GFR calc non Af Amer: 39 mL/min — ABNORMAL LOW (ref >60)
GFR, EST AFRICAN AMERICAN: 45 mL/min — AB (ref >60)
Glucose, Bld: 101 mg/dL — ABNORMAL HIGH (ref 65–99)
POTASSIUM: 4.1 mmol/L (ref 3.5–5.1)
SODIUM: 141 mmol/L (ref 135–145)

## 2016-09-05 LAB — ECHOCARDIOGRAM COMPLETE
HEIGHTINCHES: 61.25 in
Weight: 3961.6 oz

## 2016-09-05 LAB — GLUCOSE, CAPILLARY
GLUCOSE-CAPILLARY: 116 mg/dL — AB (ref 65–99)
GLUCOSE-CAPILLARY: 98 mg/dL (ref 65–99)
GLUCOSE-CAPILLARY: 173 mg/dL — AB (ref 65–99)
Glucose-Capillary: 52 mg/dL — ABNORMAL LOW (ref 65–99)
Glucose-Capillary: 138 mg/dL — ABNORMAL HIGH (ref 65–99)

## 2016-09-05 LAB — TROPONIN I

## 2016-09-05 MED ORDER — DOCUSATE SODIUM 100 MG PO CAPS
200.0000 mg | ORAL_CAPSULE | Freq: Two times a day (BID) | ORAL | Status: DC
  Administered 2016-09-05 – 2016-09-06 (×2): 200 mg via ORAL
  Filled 2016-09-05 (×2): qty 2

## 2016-09-05 MED ORDER — INSULIN GLARGINE 100 UNIT/ML ~~LOC~~ SOLN
30.0000 [IU] | Freq: Every day | SUBCUTANEOUS | Status: DC
  Administered 2016-09-05: 30 [IU] via SUBCUTANEOUS
  Filled 2016-09-05 (×2): qty 0.3

## 2016-09-05 NOTE — Progress Notes (Signed)
PROGRESS NOTE    [name]  [id number] DOB: Apr 30, 1956 DOA: 09/04/2016 PCP: Elyn Peers, MD   Brief Narrative:  60 y/o presenting with increase swelling in lower extremities and elevated BNP. Symptoms suspicious for acute CHF exacerbation. Currently undergoing work up.   Assessment & Plan:   Principal Problem:   Acute heart failure ([place]) - unable to categorize, will obtain echocardiogram. - continue to hold B blocker - lasix - troponin wnl on last check  Active Problems:   Diabetes mellitus ([facility]) - Lantus - SSI - diabetic diet    Hyperlipidemia - none    OSA (obstructive sleep apnea) - stable    Glaucoma - Continue home medication regimen    Hypertension -currently on amblodipine - stable   DVT prophylaxis: Lovenox Code Status: Full Family Communication: d/c Disposition Plan: telemetry   Consultants:   None   Procedures:    Antimicrobials: None   Subjective: Pt has no new complaints. No acute issues overnight.  Objective: Vitals:   09/04/16 1830 09/04/16 2006 09/04/16 2145 09/05/16 0450  BP: 151/63 135/57  (!) 118/47  Pulse: (!) 49 (!) 54  (!) 46  Resp:  18 18 18   Temp:  98.9 F (37.2 C)  98 F (36.7 C)  TempSrc:  Oral  Oral  SpO2: 99% 100% 100% 100%  Weight:  112.2 kg (247 lb 6.4 oz)  112.3 kg (247 lb 9.6 oz)  Height:  5' 1.25" (1.556 m)      Intake/Output Summary (Last 24 hours) at 09/05/16 0823 Last data filed at 09/05/16 0600  Gross per 24 hour  Intake              720 ml  Output             2200 ml  Net            -1480 ml   Filed Weights   09/04/16 2006 09/05/16 0450  Weight: 112.2 kg (247 lb 6.4 oz) 112.3 kg (247 lb 9.6 oz)    Examination:  General exam: Appears calm and comfortable  Respiratory system: Clear to auscultation. Respiratory effort normal. Cardiovascular system: S1 & S2 heard, RRR. No JVD, murmurs, rubs, gallops or clicks. + pedal edema. Gastrointestinal system: Abdomen is nondistended, soft and  nontender. No organomegaly or masses felt. Normal bowel sounds heard. Central nervous system: Alert and oriented. No focal neurological deficits. Extremities: Symmetric 5 x 5 power. Skin: No rashes, lesions or ulcers, on limited exam. Psychiatry: Judgement and insight appear normal. Mood & affect appropriate.   Data Reviewed: I have personally reviewed following labs and imaging studies  CBC:  Recent Labs Lab 09/04/16 1258  WBC 7.2  NEUTROABS 5.3  HGB 10.9*  HCT 34.6*  MCV 79.4  PLT [id number]   Basic Metabolic Panel:  Recent Labs Lab 09/04/16 1258 09/05/16 0325  NA 142 141  K 4.9 4.1  CL 111 110  CO2 24 25  GLUCOSE 137* 101*  BUN 23* 28*  CREATININE 1.38* 1.44*  CALCIUM 8.9 8.6*   GFR: Estimated Creatinine Clearance: 49.1 mL/min (by C-G formula based on SCr of 1.44 mg/dL (H)). Liver Function Tests: No results for input(s): AST, ALT, ALKPHOS, BILITOT, PROT, ALBUMIN in the last 168 hours. No results for input(s): LIPASE, AMYLASE in the last 168 hours. No results for input(s): AMMONIA in the last 168 hours. Coagulation Profile: No results for input(s): INR, PROTIME in the last 168 hours. Cardiac Enzymes:  Recent Labs Lab 09/04/16 1258 09/04/16 2111  09/05/16 0325  TROPONINI 0.03* 0.03* <0.03   BNP (last 3 results) No results for input(s): PROBNP in the last 8760 hours. HbA1C: No results for input(s): HGBA1C in the last 72 hours. CBG:  Recent Labs Lab 09/04/16 2150 09/05/16 0508 09/05/16 0553  GLUCAP 159* 52* 116*   Lipid Profile: No results for input(s): CHOL, HDL, LDLCALC, TRIG, CHOLHDL, LDLDIRECT in the last 72 hours. Thyroid Function Tests: No results for input(s): TSH, T4TOTAL, FREET4, T3FREE, THYROIDAB in the last 72 hours. Anemia Panel: No results for input(s): VITAMINB12, FOLATE, FERRITIN, TIBC, IRON, RETICCTPCT in the last 72 hours. Sepsis Labs: No results for input(s): PROCALCITON, LATICACIDVEN in the last 168 hours.  No results found for this  or any previous visit (from the past 240 hour(s)).       Radiology Studies: Dg Chest 2 View  Result Date: 09/04/2016 CLINICAL DATA:  Shortness of breath for the past 5 weeks with increased symptoms over the past 2 weeks associated with mid chest tightness and cough and peripheral edema. History of asthma, diabetes, hypertension, obesity, former smoker. EXAM: CHEST  2 VIEW COMPARISON:  PA and lateral chest x-ray of October 08, 2015 FINDINGS: The lungs are adequately inflated. There is no focal infiltrate. The cardiac silhouette is enlarged. The pulmonary vascularity is mildly engorged. The interstitial markings are slightly increased but this is not new. There is calcification in the wall of the aortic arch. There is no pleural effusion. The bony thorax is unremarkable. IMPRESSION: Cardiomegaly with central pulmonary vascular congestion may reflect low-grade CHF. There is no alveolar pneumonia. Thoracic aortic atherosclerosis. Electronically Signed   By: David  Martinique M.D.   On: 09/04/2016 12:32        Scheduled Meds: . amLODipine  10 mg Oral Daily  . canagliflozin  100 mg Oral QAC breakfast  . dorzolamide-timolol  1 drop Both Eyes BID  . doxazosin  4 mg Oral QHS  . enoxaparin (LOVENOX) injection  40 mg Subcutaneous Q24H  . furosemide  20 mg Intravenous Daily  . insulin aspart  0-15 Units Subcutaneous TID WC  . insulin aspart  0-5 Units Subcutaneous QHS  . insulin glargine  40 Units Subcutaneous QHS  . latanoprost  1 drop Both Eyes QHS  . potassium chloride  10 mEq Oral BID  . sodium chloride flush  3 mL Intravenous Q12H   Continuous Infusions: . sodium chloride 10 mL/hr (09/04/16 1325)     LOS: 1 day    Time spent: > 35 minutes  Velvet Bathe, MD [facility] Pager [phone]  If 7PM-7AM, please contact night-coverage [email or web address] Password [email or web address] 09/05/2016, 8:23 AM

## 2016-09-05 NOTE — Progress Notes (Signed)
Inpatient Diabetes Program Recommendations  AACE/ADA: New Consensus Statement on Inpatient Glycemic Control (2015)  Target Ranges:  Prepandial:   less than 140 mg/dL      Peak postprandial:   less than 180 mg/dL (1-2 hours)      Critically ill patients:  140 - 180 mg/dL   Lab Results  Component Value Date   GLUCAP 116 (H) 09/05/2016   HGBA1C 13.5 01/22/2013    Review of Glycemic Control  Results for [name], [name] (MRN [id number]) as of 09/05/2016 10:44  Ref. Range 09/04/2016 21:50 09/05/2016 05:08 09/05/2016 05:53  Glucose-Capillary Latest Ref Range: 65 - 99 mg/dL 159 (H) 52 (L) 116 (H)    Diabetes history: Type 2 Outpatient Diabetes medications: Jardience 25 units qhs, Novolog 3-18 units tid with meals, Lantus 40 units qhs Current orders for Inpatient glycemic control: Lantus 40 units qhs, Invokana 100mg  qday, Novolog 0-15 units tid, Novolog 0-5 units qhs  Inpatient Diabetes Program Recommendations:  Consider decreasing Lantus to 30 units qhs - CBG 52mg /dl this am at 0508.  Gentry Fitz, RN, BA, MHA, CDE Diabetes Coordinator Inpatient Diabetes Program  [phone] (Team Pager)  09/05/2016 10:47 AM

## 2016-09-05 NOTE — Progress Notes (Signed)
Hypoglycemic Event  CBG: 52  Treatment: 15 GM carbohydrate snack  Symptoms: Shaky and Nervous/irritable  Follow-up CBG: Time:0550 CBG Result:116  Possible Reasons for Event: Medication regimen: Too much lantus  Comments/MD notified:n/a     Carnella [place] I

## 2016-09-05 NOTE — Evaluation (Signed)
Physical Therapy Evaluation Patient Details Name: [name] MRN: [id number] DOB: February 20, 1956 Today's Date: 09/05/2016   History of Present Illness  60 y.o. female with medical history significant of hypertension, glaucoma, diabetes mellitus, obesity, sciatica, chronic back pain that presents with leg swelling and weeping and admitted for new acute congestive heart failure  Clinical Impression  Pt admitted with above diagnosis. Pt currently with functional limitations due to the deficits listed below (see PT Problem List).  Pt will benefit from skilled PT to increase their independence and safety with mobility to allow discharge to the venue listed below.   Pt mobilizing well except feeling dizzy and requiring chair.  HR 45 bpm during gait.  Pt states her daughter lives with her however she works.       Follow Up Recommendations No PT follow up;Supervision for mobility/OOB    Equipment Recommendations  None recommended by PT    Recommendations for Other Services       Precautions / Restrictions Precautions Precautions: Fall      Mobility  Bed Mobility Overal bed mobility: Needs Assistance Bed Mobility: Sit to Supine       Sit to supine: Supervision   General bed mobility comments: pt up in bathroom on arrival  Transfers Overall transfer level: Needs assistance Equipment used: Rolling walker (2 wheeled) Transfers: Sit to/from Stand Sit to Stand: Min guard         General transfer comment: min/guard for safety, pt in bathroom on arrival, ambulated back to bed for short rest break prior to ambulating in hallway  Ambulation/Gait Ambulation/Gait assistance: Min guard Ambulation Distance (Feet): 40 Feet Assistive device: Rolling walker (2 wheeled) Gait Pattern/deviations: Step-through pattern;Decreased stride length     General Gait Details: pt with increased dizziness requiring seat pulled behind pt, SpO2 100% on room air, HR 45 however (RN notified and aware), pt  taken back to her room in chair  Stairs            Wheelchair Mobility    Modified Rankin (Stroke Patients Only)       Balance                                             Pertinent Vitals/Pain Pain Assessment: No/denies pain    Home Living Family/patient expects to be discharged to:: Private residence Living Arrangements: Children (daughter) Available Help at Discharge: Available PRN/intermittently (daughter works) Type of Home: [place]: One level Home Equipment: Environmental consultant - 4 wheels;Walker - 2 wheels      Prior Function Level of Independence: Independent with assistive device(s)               Hand Dominance        Extremity/Trunk Assessment        Lower Extremity Assessment Lower Extremity Assessment: Generalized weakness       Communication   Communication: No difficulties  Cognition Arousal/Alertness: Awake/alert Behavior During Therapy: WFL for tasks assessed/performed Overall Cognitive Status: Within Functional Limits for tasks assessed                      General Comments      Exercises     Assessment/Plan    PT Assessment Patient needs continued PT services  PT Problem List Decreased mobility;Cardiopulmonary status limiting activity;Decreased activity tolerance  PT Treatment Interventions DME instruction;Gait training;Functional mobility training;Therapeutic exercise;Therapeutic activities;Patient/family education    PT Goals (Current goals can be found in the Care Plan section)  Acute Rehab PT Goals PT Goal Formulation: With patient Time For Goal Achievement: 09/12/16 Potential to Achieve Goals: Good    Frequency Min 3X/week   Barriers to discharge        Co-evaluation               End of Session Equipment Utilized During Treatment: Gait belt Activity Tolerance: Other (comment) (limited by dizziness) Patient left: with call bell/phone within reach;in  bed;with family/visitor present;with bed alarm set Nurse Communication: Mobility status         Time: [id number] PT Time Calculation (min) (ACUTE ONLY): 17 min   Charges:   PT Evaluation $PT Eval Moderate Complexity: 1 Procedure     PT G Codes:        Tityana Pagan,KATHrine E 09/05/2016, 12:47 PM Carmelia Bake, PT, DPT 09/05/2016 Pager: [phone]

## 2016-09-05 NOTE — Progress Notes (Signed)
OT Cancellation Note  Patient Details Name: [name] MRN: [id number] DOB: [birth year]   Cancelled Treatment:    Reason Eval/Treat Not Completed: Fatigue/lethargy limiting ability to participate;Other (comment). Pt with decreased HR, increased BP and c/o dizziness during very recent PT session. RN has notified pt's MD. OT will check back on pt later today as able, or tomorrow  [name] 09/05/2016, 2:07 PM

## 2016-09-05 NOTE — Progress Notes (Signed)
  Echocardiogram 2D Echocardiogram has been performed.  Jennette Dubin 09/05/2016, 3:59 PM

## 2016-09-05 NOTE — Progress Notes (Signed)
MD Wendee Beavers paged regarding patient's HR of 45 with ambulation. Pt also c/o dizziness and had to sit in chair while ambulating in hallway with PT. BP 144/45, O2 Sats 100% RA.

## 2016-09-06 DIAGNOSIS — I10 Essential (primary) hypertension: Secondary | ICD-10-CM

## 2016-09-06 DIAGNOSIS — I5031 Acute diastolic (congestive) heart failure: Secondary | ICD-10-CM

## 2016-09-06 LAB — BASIC METABOLIC PANEL
Anion gap: 6 (ref 5–15)
BUN: 25 mg/dL — ABNORMAL HIGH (ref 6–20)
CHLORIDE: 108 mmol/L (ref 101–111)
CO2: 25 mmol/L (ref 22–32)
Calcium: 8.6 mg/dL — ABNORMAL LOW (ref 8.9–10.3)
Creatinine, Ser: 1.47 mg/dL — ABNORMAL HIGH (ref 0.44–1.00)
GFR calc non Af Amer: 38 mL/min — ABNORMAL LOW (ref >60)
GFR, EST AFRICAN AMERICAN: 44 mL/min — AB (ref >60)
Glucose, Bld: 61 mg/dL — ABNORMAL LOW (ref 65–99)
POTASSIUM: 4.4 mmol/L (ref 3.5–5.1)
SODIUM: 139 mmol/L (ref 135–145)

## 2016-09-06 LAB — GLUCOSE, CAPILLARY
GLUCOSE-CAPILLARY: 56 mg/dL — AB (ref 65–99)
GLUCOSE-CAPILLARY: 133 mg/dL — AB (ref 65–99)
GLUCOSE-CAPILLARY: 108 mg/dL — AB (ref 65–99)
Glucose-Capillary: 178 mg/dL — ABNORMAL HIGH (ref 65–99)

## 2016-09-06 MED ORDER — INSULIN GLARGINE 100 UNITS/ML SOLOSTAR PEN: 35.0000 [IU] | PEN_INJECTOR | Freq: Every day | SUBCUTANEOUS | 11 refills | Status: DC

## 2016-09-06 MED ORDER — CARVEDILOL 6.25 MG PO TABS: 6.2500 mg | ORAL_TABLET | Freq: Two times a day (BID) | ORAL | 0 refills | Status: DC

## 2016-09-06 MED ORDER — FUROSEMIDE 20 MG PO TABS: 20.0000 mg | ORAL_TABLET | Freq: Every day | ORAL | 0 refills | Status: DC | PRN

## 2016-09-06 NOTE — Discharge Summary (Addendum)
Physician Discharge Summary  [name] [id number] DOB: 1956-04-16 DOA: 09/04/2016  PCP: Elyn Peers, MD  Admit date: 09/04/2016 Discharge date: 09/06/2016  Time spent: > 35 minutes  Recommendations for Outpatient Follow-up:  1. Monitor blood pressures 2. Suspect diastolic HF as patient presented with elevated BNP, swelling at lower extremities BL, and DOE. Echocardiogram was not able to assess diastolic function.   Discharge Diagnoses:  Principal Problem:   Acute heart failure ([place]) Active Problems:   Diabetes mellitus ([place])   Hyperlipidemia   OSA (obstructive sleep apnea)   Glaucoma   Hypertension   Discharge Condition: stable  Diet recommendation: diabetic diet  Filed Weights   09/04/16 2006 09/05/16 0450 09/06/16 0642  Weight: 112.2 kg (247 lb 6.4 oz) 112.3 kg (247 lb 9.6 oz) 111.6 kg (246 lb 1.6 oz)    History of present illness:  60 y/o that presented with presumed diastolic HF to the hospital.  Hospital Course:  Diastolic HF - suspected secondary to elevated BNP, lower extremity edema, and DOE. Symptoms improved with diuresis - Will d/c on lasix for symptom management. Recommended follow up with her primary care physician. - unable to use ARB or ACEI secondary to elevate serum creatinine - Troponin WNL on last check - Echocardiogram showing normal EF but unable to assess diastolic function due to exam limitations. - continue B blocker at lower dose  Essential HTN - discontinued amlodipine due to bradycardia this am - lowered B blocker dose  DM - continue home medication regimen. Decreased Lantus dose  Procedures:  Echocardiogram: With EF of 60-65%  Consultations:  None  Discharge Exam: Vitals:   09/05/16 2340 09/06/16 0642  BP:  (!) 156/60  Pulse: (!) 51 (!) 46  Resp: 18 20  Temp:  98 F (36.7 C)    General: Pt in nad, alert and awake Cardiovascular: rrr, no rubs Respiratory: no increased wob, no wheezes  Discharge  Instructions   Discharge Instructions    Call MD for:  difficulty breathing, headache or visual disturbances    Complete by:  As directed    Call MD for:  persistant nausea and vomiting    Complete by:  As directed    Diet - low sodium heart healthy    Complete by:  As directed    Increase activity slowly    Complete by:  As directed      Current Discharge Medication List    START taking these medications   Details  furosemide (LASIX) 20 MG tablet Take 1 tablet (20 mg total) by mouth daily as needed. For edema Qty: 15 tablet, Refills: 0      CONTINUE these medications which have CHANGED   Details  carvedilol (COREG) 6.25 MG tablet Take 1 tablet (6.25 mg total) by mouth 2 (two) times daily with a meal. Qty: 60 tablet, Refills: 0    insulin glargine (LANTUS) 100 unit/mL SOPN Inject 0.35 mLs (35 Units total) into the skin at bedtime. Qty: 15 mL, Refills: 11      CONTINUE these medications which have NOT CHANGED   Details  albuterol (PROVENTIL HFA;VENTOLIN HFA) 108 (90 BASE) MCG/ACT inhaler Inhale 2 puffs into the lungs every 6 (six) hours as needed for wheezing or shortness of breath. For shortness of breath    dorzolamide-timolol (COSOPT) 22.3-6.8 MG/ML ophthalmic solution Place 1 drop into both eyes 2 (two) times daily.    doxazosin (CARDURA) 4 MG tablet Take 4 mg by mouth at bedtime.    empagliflozin (JARDIANCE) 25  MG TABS tablet Take 25 mg by mouth at bedtime.    Travoprost, BAK Free, (TRAVATAN) 0.004 % SOLN ophthalmic solution Place 1 drop into both eyes at bedtime.      STOP taking these medications     amLODipine (NORVASC) 10 MG tablet      hydrochlorothiazide (HYDRODIURIL) 12.5 MG tablet      ibuprofen (ADVIL,MOTRIN) 200 MG tablet      insulin aspart (NOVOLOG FLEXPEN) 100 UNIT/ML FlexPen        Allergies  Allergen Reactions  . Pork-Derived Products Nausea And Vomiting      The results of significant diagnostics from this hospitalization (including  imaging, microbiology, ancillary and laboratory) are listed below for reference.    Significant Diagnostic Studies: Dg Chest 2 View  Result Date: 09/04/2016 CLINICAL DATA:  Shortness of breath for the past 5 weeks with increased symptoms over the past 2 weeks associated with mid chest tightness and cough and peripheral edema. History of asthma, diabetes, hypertension, obesity, former smoker. EXAM: CHEST  2 VIEW COMPARISON:  PA and lateral chest x-ray of October 08, 2015 FINDINGS: The lungs are adequately inflated. There is no focal infiltrate. The cardiac silhouette is enlarged. The pulmonary vascularity is mildly engorged. The interstitial markings are slightly increased but this is not new. There is calcification in the wall of the aortic arch. There is no pleural effusion. The bony thorax is unremarkable. IMPRESSION: Cardiomegaly with central pulmonary vascular congestion may reflect low-grade CHF. There is no alveolar pneumonia. Thoracic aortic atherosclerosis. Electronically Signed   By: David  Martinique M.D.   On: 09/04/2016 12:32    Microbiology: No results found for this or any previous visit (from the past 240 hour(s)).   Labs: Basic Metabolic Panel:  Recent Labs Lab 09/04/16 1258 09/05/16 0325 09/06/16 0544  NA 142 141 139  K 4.9 4.1 4.4  CL 111 110 108  CO2 24 25 25   GLUCOSE 137* 101* 61*  BUN 23* 28* 25*  CREATININE 1.38* 1.44* 1.47*  CALCIUM 8.9 8.6* 8.6*   Liver Function Tests: No results for input(s): AST, ALT, ALKPHOS, BILITOT, PROT, ALBUMIN in the last 168 hours. No results for input(s): LIPASE, AMYLASE in the last 168 hours. No results for input(s): AMMONIA in the last 168 hours. CBC:  Recent Labs Lab 09/04/16 1258  WBC 7.2  NEUTROABS 5.3  HGB 10.9*  HCT 34.6*  MCV 79.4  PLT 173   Cardiac Enzymes:  Recent Labs Lab 09/04/16 1258 09/04/16 2111 09/05/16 0325 09/05/16 0953  TROPONINI 0.03* 0.03* <0.03 <0.03   BNP: BNP (last 3 results)  Recent  Labs  10/08/15 2000 09/04/16 1259  BNP 107.5* 152.8*    ProBNP (last 3 results) No results for input(s): PROBNP in the last 8760 hours.  CBG:  Recent Labs Lab 09/05/16 1210 09/05/16 1637 09/05/16 2120 09/06/16 0658 09/06/16 0812  GLUCAP 98 138* 173* 56* 133*    Signed:  Velvet Bathe MD.  [facility] 09/06/2016, 10:54 AM

## 2016-09-06 NOTE — Progress Notes (Signed)
Discussed discharge plan with pt and reviewed instructions.  Pt unable to get a ride until 7:30pm or 8pm tonight when her boyfriend gets off work. Coolidge Breeze, RN 09/06/2016

## 2016-09-06 NOTE — Evaluation (Signed)
Occupational Therapy Evaluation Patient Details Name: [name] MRN: [id number] DOB: November 05, 1955 Today's Date: 09/06/2016    History of Present Illness 60 y.o. female with medical history significant of hypertension, glaucoma, diabetes mellitus, obesity, sciatica, chronic back pain that presents with leg swelling and weeping and admitted for new acute congestive heart failure   Clinical Impression   Pt was admitted for the above.  Will follow in acute with supervision to mod I level goals. She currently needs min guard assistance for safety.    Follow Up Recommendations   (no follow up OT; as much S as possible initially)    Equipment Recommendations  None recommended by OT    Recommendations for Other Services       Precautions / Restrictions Precautions Precautions: Fall      Mobility Bed Mobility               General bed mobility comments: supervision for back to bed  Transfers   Equipment used: Rolling walker (2 wheeled)   Sit to Stand: Min guard         General transfer comment: for safety    Balance Overall balance assessment: Needs assistance             Standing balance comment: min guard for safety during adls                            ADL Overall ADL's : Needs assistance/impaired                 Upper Body Dressing : Min guard;Standing   Lower Body Dressing: Minimal assistance;Sit to/from stand;With adaptive equipment   Toilet Transfer: Min guard;Ambulation;Comfort height toilet;RW   Toileting- Water quality scientist and Hygiene: Min guard;Sit to/from stand         General ADL Comments: saw pt after she got out of shower.  Cued for safety with RW as she didn't have it in front of her all the time (she turned away from it) and has a couple of episodes of unsteadiness/being off balance.  Pt able to self-correct.  Issued sock aide and long shoehorn for use at home.  Pt has h/o back sx.  She will not get into the  shower unless her daughter is there     Vision     Perception     Praxis      Pertinent Vitals/Pain Pain Assessment: No/denies pain     Hand Dominance     Extremity/Trunk Assessment Upper Extremity Assessment Upper Extremity Assessment: Overall WFL for tasks assessed           Communication Communication Communication: No difficulties   Cognition Arousal/Alertness: Awake/alert Behavior During Therapy: WFL for tasks assessed/performed Overall Cognitive Status: Within Functional Limits for tasks assessed                     General Comments       Exercises       Shoulder Instructions      Home Living Family/patient expects to be discharged to:: Private residence Living Arrangements: Children Available Help at Discharge: Available PRN/intermittently               Bathroom Shower/Tub: Tub/shower unit Shower/tub characteristics: Architectural technologist: Standard     Home Equipment: Shower seat          Prior Functioning/Environment Level of Independence: Independent with assistive device(s)  OT Problem List: Impaired balance (sitting and/or standing);Decreased knowledge of use of DME or AE   OT Treatment/Interventions: Self-care/ADL training;DME and/or AE instruction;Patient/family education;Balance training    OT Goals(Current goals can be found in the care plan section) Acute Rehab OT Goals Patient Stated Goal: return to independence OT Goal Formulation: With patient Time For Goal Achievement: 09/13/16 Potential to Achieve Goals: Good ADL Goals Pt Will Transfer to Toilet: with modified independence;bedside commode;ambulating;regular height toilet (with RW) Additional ADL Goal #1: pt will gather clothes and complete ADL, using AE, at supervision level without safety cues  OT Frequency: Min 2X/week   Barriers to D/C:            Co-evaluation              End of Session    Activity Tolerance: Patient  tolerated treatment well Patient left: in bed;with call bell/phone within reach   Time: 1323-1343 OT Time Calculation (min): 20 min Charges:  OT General Charges $OT Visit: 1 Procedure OT Evaluation $OT Eval Low Complexity: 1 Procedure G-Codes:    Carmelia Tiner 2016-09-11, 2:40 PM  Lesle Chris, OTR/L [phone] 09/11/2016

## 2016-11-30 ENCOUNTER — Inpatient Hospital Stay (HOSPITAL_COMMUNITY)
Admission: EM | Admit: 2016-11-30 | Discharge: 2016-12-07 | DRG: 286 | Disposition: A | Discharge status: Home Health | Payer: Medicaid Other | Attending: Internal Medicine | Admitting: Internal Medicine

## 2016-11-30 ENCOUNTER — Encounter (HOSPITAL_COMMUNITY): Payer: Self-pay | Admitting: Emergency Medicine

## 2016-11-30 ENCOUNTER — Emergency Department (HOSPITAL_COMMUNITY): Payer: Medicaid Other

## 2016-11-30 DIAGNOSIS — I130 Hypertensive heart and chronic kidney disease with heart failure and stage 1 through stage 4 chronic kidney disease, or unspecified chronic kidney disease: Secondary | ICD-10-CM | POA: Diagnosis present

## 2016-11-30 DIAGNOSIS — G8929 Other chronic pain: Secondary | ICD-10-CM | POA: Diagnosis present

## 2016-11-30 DIAGNOSIS — E669 Obesity, unspecified: Secondary | ICD-10-CM

## 2016-11-30 DIAGNOSIS — Z9049 Acquired absence of other specified parts of digestive tract: Secondary | ICD-10-CM

## 2016-11-30 DIAGNOSIS — M549 Dorsalgia, unspecified: Secondary | ICD-10-CM | POA: Diagnosis present

## 2016-11-30 DIAGNOSIS — N179 Acute kidney failure, unspecified: Secondary | ICD-10-CM | POA: Diagnosis present

## 2016-11-30 DIAGNOSIS — I509 Heart failure, unspecified: Secondary | ICD-10-CM | POA: Diagnosis not present

## 2016-11-30 DIAGNOSIS — E785 Hyperlipidemia, unspecified: Secondary | ICD-10-CM | POA: Diagnosis present

## 2016-11-30 DIAGNOSIS — E1122 Type 2 diabetes mellitus with diabetic chronic kidney disease: Secondary | ICD-10-CM | POA: Diagnosis present

## 2016-11-30 DIAGNOSIS — N183 Chronic kidney disease, stage 3 unspecified: Secondary | ICD-10-CM | POA: Diagnosis present

## 2016-11-30 DIAGNOSIS — I10 Essential (primary) hypertension: Secondary | ICD-10-CM | POA: Diagnosis present

## 2016-11-30 DIAGNOSIS — J45909 Unspecified asthma, uncomplicated: Secondary | ICD-10-CM | POA: Diagnosis present

## 2016-11-30 DIAGNOSIS — R0602 Shortness of breath: Secondary | ICD-10-CM | POA: Diagnosis present

## 2016-11-30 DIAGNOSIS — H409 Unspecified glaucoma: Secondary | ICD-10-CM | POA: Diagnosis present

## 2016-11-30 DIAGNOSIS — I5033 Acute on chronic diastolic (congestive) heart failure: Secondary | ICD-10-CM | POA: Diagnosis present

## 2016-11-30 DIAGNOSIS — Z6841 Body Mass Index (BMI) 40.0 and over, adult: Secondary | ICD-10-CM | POA: Diagnosis not present

## 2016-11-30 DIAGNOSIS — R0902 Hypoxemia: Secondary | ICD-10-CM | POA: Diagnosis present

## 2016-11-30 DIAGNOSIS — D509 Iron deficiency anemia, unspecified: Secondary | ICD-10-CM | POA: Diagnosis present

## 2016-11-30 DIAGNOSIS — E7801 Familial hypercholesterolemia: Secondary | ICD-10-CM | POA: Diagnosis not present

## 2016-11-30 DIAGNOSIS — I272 Pulmonary hypertension, unspecified: Secondary | ICD-10-CM | POA: Diagnosis present

## 2016-11-30 DIAGNOSIS — E782 Mixed hyperlipidemia: Secondary | ICD-10-CM | POA: Diagnosis not present

## 2016-11-30 DIAGNOSIS — R001 Bradycardia, unspecified: Secondary | ICD-10-CM | POA: Diagnosis present

## 2016-11-30 DIAGNOSIS — D696 Thrombocytopenia, unspecified: Secondary | ICD-10-CM | POA: Diagnosis present

## 2016-11-30 DIAGNOSIS — I5032 Chronic diastolic (congestive) heart failure: Secondary | ICD-10-CM | POA: Diagnosis present

## 2016-11-30 DIAGNOSIS — R55 Syncope and collapse: Secondary | ICD-10-CM | POA: Diagnosis not present

## 2016-11-30 DIAGNOSIS — N170 Acute kidney failure with tubular necrosis: Secondary | ICD-10-CM | POA: Diagnosis not present

## 2016-11-30 DIAGNOSIS — E662 Morbid (severe) obesity with alveolar hypoventilation: Secondary | ICD-10-CM | POA: Diagnosis present

## 2016-11-30 DIAGNOSIS — E7800 Pure hypercholesterolemia, unspecified: Secondary | ICD-10-CM | POA: Diagnosis not present

## 2016-11-30 DIAGNOSIS — G4733 Obstructive sleep apnea (adult) (pediatric): Secondary | ICD-10-CM | POA: Diagnosis not present

## 2016-11-30 DIAGNOSIS — Z794 Long term (current) use of insulin: Secondary | ICD-10-CM

## 2016-11-30 DIAGNOSIS — Z91018 Allergy to other foods: Secondary | ICD-10-CM | POA: Diagnosis not present

## 2016-11-30 DIAGNOSIS — I1310 Hypertensive heart and chronic kidney disease without heart failure, with stage 1 through stage 4 chronic kidney disease, or unspecified chronic kidney disease: Secondary | ICD-10-CM

## 2016-11-30 DIAGNOSIS — Z87891 Personal history of nicotine dependence: Secondary | ICD-10-CM

## 2016-11-30 DIAGNOSIS — E781 Pure hyperglyceridemia: Secondary | ICD-10-CM | POA: Diagnosis not present

## 2016-11-30 HISTORY — DX: Chronic diastolic (congestive) heart failure: I50.32

## 2016-11-30 HISTORY — DX: Morbid (severe) obesity due to excess calories: E66.01

## 2016-11-30 LAB — I-STAT TROPONIN, ED
TROPONIN I, POC: 0 ng/mL (ref 0.00–0.08)
Troponin i, poc: 0.01 ng/mL (ref 0.00–0.08)

## 2016-11-30 LAB — PROTIME-INR
INR: 0.96
PROTHROMBIN TIME: 12.8 s (ref 11.4–15.2)

## 2016-11-30 LAB — CBC WITH DIFFERENTIAL/PLATELET
BASOS ABS: 0.2 10*3/uL — AB (ref 0.0–0.1)
Basophils Relative: 3 %
EOS ABS: 0.3 10*3/uL (ref 0.0–0.7)
EOS PCT: 4 %
HCT: 37.1 % (ref 36.0–46.0)
Hemoglobin: 11.5 g/dL — ABNORMAL LOW (ref 12.0–15.0)
Lymphocytes Relative: 27 %
Lymphs Abs: 2 10*3/uL (ref 0.7–4.0)
MCH: 25.6 pg — ABNORMAL LOW (ref 26.0–34.0)
MCHC: 31 g/dL (ref 30.0–36.0)
MCV: 82.4 fL (ref 78.0–100.0)
MONO ABS: 0.5 10*3/uL (ref 0.1–1.0)
Monocytes Relative: 6 %
Neutro Abs: 4.5 10*3/uL (ref 1.7–7.7)
Neutrophils Relative %: 60 %
PLATELETS: 140 10*3/uL — AB (ref 150–400)
RBC: 4.5 MIL/uL (ref 3.87–5.11)
RDW: 15.9 % — AB (ref 11.5–15.5)
WBC: 7.5 10*3/uL (ref 4.0–10.5)

## 2016-11-30 LAB — GLUCOSE, CAPILLARY
GLUCOSE-CAPILLARY: 119 mg/dL — AB (ref 65–99)
GLUCOSE-CAPILLARY: 58 mg/dL — AB (ref 65–99)

## 2016-11-30 LAB — URINALYSIS, ROUTINE W REFLEX MICROSCOPIC
Bilirubin Urine: NEGATIVE
HGB URINE DIPSTICK: NEGATIVE
Ketones, ur: NEGATIVE mg/dL
Nitrite: NEGATIVE
PH: 6 (ref 5.0–8.0)
Protein, ur: NEGATIVE mg/dL
Specific Gravity, Urine: 1.022 (ref 1.005–1.030)

## 2016-11-30 LAB — COMPREHENSIVE METABOLIC PANEL
ALBUMIN: 3.3 g/dL — AB (ref 3.5–5.0)
ALT: 13 U/L — ABNORMAL LOW (ref 14–54)
ANION GAP: 6 (ref 5–15)
AST: 17 U/L (ref 15–41)
Alkaline Phosphatase: 72 U/L (ref 38–126)
BILIRUBIN TOTAL: 0.3 mg/dL (ref 0.3–1.2)
BUN: 26 mg/dL — AB (ref 6–20)
CO2: 24 mmol/L (ref 22–32)
Calcium: 8.7 mg/dL — ABNORMAL LOW (ref 8.9–10.3)
Chloride: 110 mmol/L (ref 101–111)
Creatinine, Ser: 1.44 mg/dL — ABNORMAL HIGH (ref 0.44–1.00)
GFR calc Af Amer: 45 mL/min — ABNORMAL LOW (ref >60)
GFR calc non Af Amer: 39 mL/min — ABNORMAL LOW (ref >60)
Glucose, Bld: 111 mg/dL — ABNORMAL HIGH (ref 65–99)
POTASSIUM: 4.4 mmol/L (ref 3.5–5.1)
SODIUM: 140 mmol/L (ref 135–145)
TOTAL PROTEIN: 7 g/dL (ref 6.5–8.1)

## 2016-11-30 LAB — TSH: TSH: 3.247 u[IU]/mL (ref 0.350–4.500)

## 2016-11-30 LAB — I-STAT CG4 LACTIC ACID, ED
LACTIC ACID, VENOUS: 1.03 mmol/L (ref 0.5–1.9)
Lactic Acid, Venous: 0.74 mmol/L (ref 0.5–1.9)

## 2016-11-30 LAB — D-DIMER, QUANTITATIVE: D-Dimer, Quant: 0.45 ug/mL-FEU (ref 0.00–0.50)

## 2016-11-30 LAB — BRAIN NATRIURETIC PEPTIDE: B Natriuretic Peptide: 107 pg/mL — ABNORMAL HIGH (ref 0.0–100.0)

## 2016-11-30 MED ORDER — SODIUM CHLORIDE 0.9 % IV SOLN: 250.0000 mL | INTRAVENOUS | Status: DC | PRN

## 2016-11-30 MED ORDER — ACETAMINOPHEN 325 MG PO TABS
650.0000 mg | ORAL_TABLET | ORAL | Status: DC | PRN
  Administered 2016-12-01 – 2016-12-05 (×8): 650 mg via ORAL
  Filled 2016-11-30 (×8): qty 2

## 2016-11-30 MED ORDER — IPRATROPIUM-ALBUTEROL 0.5-2.5 (3) MG/3ML IN SOLN: 3.0000 mL | RESPIRATORY_TRACT | Status: DC | PRN

## 2016-11-30 MED ORDER — POTASSIUM CHLORIDE CRYS ER 20 MEQ PO TBCR
20.0000 meq | EXTENDED_RELEASE_TABLET | ORAL | Status: DC
  Administered 2016-12-01: 20 meq via ORAL
  Filled 2016-11-30 (×2): qty 1

## 2016-11-30 MED ORDER — LATANOPROST 0.005 % OP SOLN
1.0000 [drp] | Freq: Every day | OPHTHALMIC | Status: DC
  Administered 2016-12-01 – 2016-12-06 (×7): 1 [drp] via OPHTHALMIC
  Filled 2016-11-30: qty 2.5

## 2016-11-30 MED ORDER — SODIUM CHLORIDE 0.9% FLUSH: 3.0000 mL | INTRAVENOUS | Status: DC | PRN

## 2016-11-30 MED ORDER — INSULIN GLARGINE 100 UNIT/ML ~~LOC~~ SOLN
20.0000 [IU] | Freq: Every day | SUBCUTANEOUS | Status: DC
Start: 2016-11-30 — End: 2016-12-02
  Administered 2016-12-01: 20 [IU] via SUBCUTANEOUS
  Filled 2016-11-30 (×2): qty 0.2

## 2016-11-30 MED ORDER — DOXAZOSIN MESYLATE 8 MG PO TABS
4.0000 mg | ORAL_TABLET | Freq: Every day | ORAL | Status: DC
  Administered 2016-12-01 – 2016-12-06 (×7): 4 mg via ORAL
  Filled 2016-11-30 (×7): qty 1

## 2016-11-30 MED ORDER — INSULIN ASPART 100 UNIT/ML ~~LOC~~ SOLN
0.0000 [IU] | Freq: Three times a day (TID) | SUBCUTANEOUS | Status: DC
  Administered 2016-12-01: 2 [IU] via SUBCUTANEOUS
  Administered 2016-12-01: 3 [IU] via SUBCUTANEOUS
  Administered 2016-12-01: 1 [IU] via SUBCUTANEOUS
  Administered 2016-12-02: 2 [IU] via SUBCUTANEOUS
  Administered 2016-12-02 – 2016-12-03 (×3): 3 [IU] via SUBCUTANEOUS
  Administered 2016-12-03 (×2): 2 [IU] via SUBCUTANEOUS
  Administered 2016-12-04: 3 [IU] via SUBCUTANEOUS
  Administered 2016-12-04 (×2): 2 [IU] via SUBCUTANEOUS
  Administered 2016-12-05: 3 [IU] via SUBCUTANEOUS
  Administered 2016-12-05: 5 [IU] via SUBCUTANEOUS
  Administered 2016-12-05 – 2016-12-06 (×2): 3 [IU] via SUBCUTANEOUS
  Administered 2016-12-06 (×2): 7 [IU] via SUBCUTANEOUS
  Administered 2016-12-07: 2 [IU] via SUBCUTANEOUS
  Administered 2016-12-07: 1 [IU] via SUBCUTANEOUS

## 2016-11-30 MED ORDER — DORZOLAMIDE HCL 2 % OP SOLN
1.0000 [drp] | Freq: Two times a day (BID) | OPHTHALMIC | Status: DC
  Administered 2016-12-01 – 2016-12-07 (×14): 1 [drp] via OPHTHALMIC
  Filled 2016-11-30: qty 10

## 2016-11-30 MED ORDER — FUROSEMIDE 10 MG/ML IJ SOLN
40.0000 mg | Freq: Once | INTRAMUSCULAR | Status: AC
  Administered 2016-11-30: 40 mg via INTRAVENOUS
  Filled 2016-11-30: qty 4

## 2016-11-30 MED ORDER — SODIUM CHLORIDE 0.9% FLUSH
3.0000 mL | Freq: Two times a day (BID) | INTRAVENOUS | Status: DC
  Administered 2016-12-01 – 2016-12-07 (×12): 3 mL via INTRAVENOUS

## 2016-11-30 MED ORDER — ONDANSETRON HCL 4 MG/2ML IJ SOLN: 4.0000 mg | Freq: Four times a day (QID) | INTRAMUSCULAR | Status: DC | PRN

## 2016-11-30 MED ORDER — FUROSEMIDE 10 MG/ML IJ SOLN
20.0000 mg | Freq: Two times a day (BID) | INTRAMUSCULAR | Status: DC
  Administered 2016-12-01 (×2): 20 mg via INTRAVENOUS
  Filled 2016-11-30 (×2): qty 2

## 2016-11-30 MED ORDER — TIMOLOL MALEATE 0.5 % OP SOLN
1.0000 [drp] | Freq: Two times a day (BID) | OPHTHALMIC | Status: DC
  Administered 2016-12-01 – 2016-12-07 (×14): 1 [drp] via OPHTHALMIC
  Filled 2016-11-30: qty 5

## 2016-11-30 MED ORDER — CARVEDILOL 12.5 MG PO TABS: 6.2500 mg | ORAL_TABLET | Freq: Two times a day (BID) | ORAL | Status: DC

## 2016-11-30 MED ORDER — DORZOLAMIDE HCL-TIMOLOL MAL 2-0.5 % OP SOLN
1.0000 [drp] | Freq: Two times a day (BID) | OPHTHALMIC | Status: DC
  Filled 2016-11-30: qty 10

## 2016-11-30 NOTE — ED Notes (Signed)
Placed patient into a gown and on the monitor ekg shown to Dr Kerby Moors

## 2016-11-30 NOTE — ED Notes (Signed)
Pt O2 sat was at 98% while standing at bedside when beginning to ambulate in hallway pt O2 sat decreased to 81% before making it back to bedside. Pt very SOB.

## 2016-11-30 NOTE — ED Notes (Signed)
Patient transported to X-ray 

## 2016-11-30 NOTE — ED Triage Notes (Signed)
Per EMS: pt has been c/o SOB and bilateral leg swelling for the past 4 days.  Went to Urgent Care today and was referred to come into the ED "to get the fluid drained off of me".  Pt also c/o of trouble walking.  A&Ox4 96% RA 61 NSR 172/70 22 RR

## 2016-11-30 NOTE — ED Notes (Signed)
Admitting Provider at bedside. 

## 2016-11-30 NOTE — ED Provider Notes (Signed)
[facility] DEPT Provider Note   CSN: [id number] Arrival date & time: 11/30/16  1636  By signing my name below, I, Ethelle Lyon Long, attest that this documentation has been prepared under the direction and in the presence of Courtney Paris, MD. Electronically Signed: Ethelle Lyon Long, Scribe. 11/30/2016. 5:38 PM.   History   Chief Complaint Chief Complaint  Patient presents with  . Shortness of Breath   The history is provided by the patient and medical records. No language interpreter was used.   HPI Comments:  [name] is a morbidly obese 61 y.o. female with a PMHx of DM, heart failure on lasix, HLD, OSA, Heart Murmur, HTN, and Asthma, who presents to the Emergency Department by way of ambulance complaining of gradually worsening SOB and bilateral lower extremity edema onset four days ago. She reports the SOB and bilateral lower extremity edema started a couple week ago and started worsening four days ago. Pt was seen at an [facility] today and was referred to the ED "to get the fluid drained off me." She states long conversations and ambulation exacerbate her SOB. Pt has associated symptoms of wheezing and decreased urine output. She normally sleeps with her head up but has trouble sleeping lately d/t her SOB. She has been taking Lasix, Jardiance, and Meloxicam at home with minimal relief of her symptoms. She also reports some loose stool of late but she attributes this to a change of diet. Pt denies rhinorrhea, congestion, cough, CP, abdominal pain, urgency, frequency, hematuria, dysuria, difficulty urinating, fever, chills, constipation, diarrhea, and any other complaints at this time.  Past Medical History:  Diagnosis Date  . Achilles rupture   . Anxiety   . Arthritis   . Asthma   . Chronic back pain   . Chronic headaches   . Chronic leg pain    bilaterally  . Depression   . Diabetes mellitus 1999   T2DM  . Glaucoma   . H/O gestational diabetes mellitus, not currently  pregnant   . Heart murmur   . Hyperlipidemia   . Hypertension   . Obesity   . OSA (obstructive sleep apnea)   . Sciatica     Patient Active Problem List   Diagnosis Date Noted  . Acute heart failure ([place]) 09/04/2016  . POSTMENOPAUSAL BLEEDING 05/09/2010  . ANEMIA 05/05/2010  . DEPRESSION 04/26/2010  . TMJ SYNDROME 04/26/2010  . DEGENERATIVE DISC DISEASE, CERVICAL SPINE 04/26/2010  . CHEST PAIN 04/26/2010  . CHRONIC OBSTRUCTIVE PULMONARY DISEASE, MODERATE 03/26/2010  . MASTALGIA 10/08/2009  . ANEMIA, IRON DEFICIENCY 07/12/2009  . Diabetes mellitus ([place]) 07/02/2009  . Hyperlipidemia 07/02/2009  . OBESITY 07/02/2009  . OSA (obstructive sleep apnea) 07/02/2009  . Glaucoma 07/02/2009  . Hypertension 07/02/2009  . ASTHMA 07/02/2009  . LOW BACK PAIN SYNDROME 07/02/2009  . BACK PAIN 07/02/2009  . PERIPHERAL EDEMA 07/02/2009  . GROIN PAIN 07/02/2009  . TOBACCO USE, QUIT 07/02/2009  . HEMORRHOIDS, INTERNAL 07/16/2008  . DIVERTICULOSIS OF COLON 07/16/2008    Past Surgical History:  Procedure Laterality Date  . BACK SURGERY    . BREAST LUMPECTOMY     Right breast  . CERVICAL SPINE SURGERY    . CESAREAN SECTION  12/10/89  . CHOLECYSTECTOMY  1991  . LUMBAR SPINE SURGERY    . TUBAL LIGATION      OB History    Gravida Para Term Preterm AB Living   1 1       1    SAB TAB Ectopic  Multiple Live Births                  Home Medications    Prior to Admission medications   Medication Sig Start Date End Date Taking? Authorizing Provider  acetaminophen (TYLENOL) 500 MG tablet Take 1,000 mg by mouth every 6 (six) hours as needed.   Yes Historical Provider, MD  albuterol (PROVENTIL HFA;VENTOLIN HFA) 108 (90 BASE) MCG/ACT inhaler Inhale 2 puffs into the lungs every 6 (six) hours as needed for wheezing or shortness of breath. For shortness of breath   Yes Robyn Haber, MD  carvedilol (COREG) 6.25 MG tablet Take 1 tablet (6.25 mg total) by mouth 2 (two) times daily with a meal.  09/06/16  Yes Velvet Bathe, MD  dorzolamide-timolol (COSOPT) 22.3-6.8 MG/ML ophthalmic solution Place 1 drop into both eyes 2 (two) times daily.   Yes Historical Provider, MD  doxazosin (CARDURA) 4 MG tablet Take 4 mg by mouth at bedtime.   Yes Historical Provider, MD  empagliflozin (JARDIANCE) 25 MG TABS tablet Take 25 mg by mouth every morning.    Yes Historical Provider, MD  furosemide (LASIX) 20 MG tablet Take 1 tablet (20 mg total) by mouth daily as needed. For edema Patient taking differently: Take 20 mg by mouth daily. For edema 09/06/16  Yes Velvet Bathe, MD  insulin glargine (LANTUS) 100 unit/mL SOPN Inject 0.35 mLs (35 Units total) into the skin at bedtime. Patient taking differently: Inject 40 Units into the skin at bedtime.  09/06/16  Yes Velvet Bathe, MD  potassium chloride SA (K-DUR,KLOR-CON) 20 MEQ tablet Take 20 mEq by mouth every other day.  11/21/16  Yes Historical Provider, MD  Travoprost, BAK Free, (TRAVATAN) 0.004 % SOLN ophthalmic solution Place 1 drop into both eyes at bedtime.   Yes Historical Provider, MD  meloxicam (MOBIC) 15 MG tablet Take 15 mg by mouth daily. 11/23/16   Historical Provider, MD    Family History No family history on file.  Social History Social History  Substance Use Topics  . Smoking status: Former Smoker    Types: Cigarettes    Quit date: 09/04/1998  . Smokeless tobacco: Never Used  . Alcohol use Yes     Comment: occasional     Allergies   Pork-derived products   Review of Systems Review of Systems  Constitutional: Negative for chills and fever.  HENT: Negative for congestion and rhinorrhea.   Respiratory: Positive for shortness of breath and wheezing. Negative for cough.   Cardiovascular: Positive for leg swelling. Negative for chest pain.  Gastrointestinal: Negative for abdominal pain, constipation and diarrhea.  Genitourinary: Positive for decreased urine volume. Negative for difficulty urinating, dysuria, frequency, hematuria and  urgency.  All other systems reviewed and are negative.   Physical Exam Updated Vital Signs BP (!) 153/51   Pulse (!) 50   Temp 98.3 F (36.8 C) (Oral)   Resp 12   Ht 5' 1.25" (1.556 m)   Wt 259 lb (117.5 kg)   SpO2 100%   BMI 48.54 kg/m   Physical Exam  Constitutional: She is oriented to person, place, and time. She appears well-developed and well-nourished. No distress.  HENT:  Head: Normocephalic and atraumatic.  Right Ear: External ear normal.  Left Ear: External ear normal.  Nose: Nose normal.  Mouth/Throat: Oropharynx is clear and moist. No oropharyngeal exudate.  Eyes: Conjunctivae and EOM are normal. Pupils are equal, round, and reactive to light.  Neck: Normal range of motion. Neck supple.  Pulmonary/Chest:  No stridor. No respiratory distress. She has no wheezes. She has rales.  Pitting edema of lower extremities. No wheezing or rhonchi. Rales in base of lower lungs.   Abdominal: She exhibits no distension. There is no tenderness. There is no rebound.  Neurological: She is alert and oriented to person, place, and time. She has normal reflexes. She exhibits normal muscle tone. Coordination normal.  Skin: Skin is warm. No rash noted. She is not diaphoretic. No erythema.  Nursing note and vitals reviewed.    ED Treatments / Results  DIAGNOSTIC STUDIES:  Oxygen Saturation is 100% on RA, normal by my interpretation.    COORDINATION OF CARE:  5:37 PM Discussed treatment plan with pt at bedside including UA, CXR, blood work, and pt agreed to plan.  Labs (all labs ordered are listed, but only abnormal results are displayed) Labs Reviewed  CBC WITH DIFFERENTIAL/PLATELET - Abnormal; Notable for the following:       Result Value   Hemoglobin 11.5 (*)    MCH 25.6 (*)    RDW 15.9 (*)    Platelets 140 (*)    Basophils Absolute 0.2 (*)    All other components within normal limits  COMPREHENSIVE METABOLIC PANEL - Abnormal; Notable for the following:    Glucose, Bld  111 (*)    BUN 26 (*)    Creatinine, Ser 1.44 (*)    Calcium 8.7 (*)    Albumin 3.3 (*)    ALT 13 (*)    GFR calc non Af Amer 39 (*)    GFR calc Af Amer 45 (*)    All other components within normal limits  BRAIN NATRIURETIC PEPTIDE - Abnormal; Notable for the following:    B Natriuretic Peptide 107.0 (*)    All other components within normal limits  URINALYSIS, ROUTINE W REFLEX MICROSCOPIC - Abnormal; Notable for the following:    Glucose, UA >=500 (*)    Leukocytes, UA TRACE (*)    Bacteria, UA FEW (*)    Squamous Epithelial / LPF 0-5 (*)    All other components within normal limits  PROTIME-INR  I-STAT CG4 LACTIC ACID, ED  I-STAT TROPOININ, ED  I-STAT CG4 LACTIC ACID, ED  Randolm Idol, ED    EKG  EKG Interpretation  Date/Time:  Thursday November 30 2016 16:36:57 EDT Ventricular Rate:  52 PR Interval:    QRS Duration: 89 QT Interval:  440 QTC Calculation: 410 R Axis:   -32 Text Interpretation:  Sinus rhythm Left axis deviation Low voltage, precordial leads When compared with ECG of 09/04/2016, No significant change was found Confirmed by [facility]  MD, DAVID ([id number]) on 11/30/2016 4:41:44 PM       Radiology Dg Chest 2 View  Result Date: 11/30/2016 CLINICAL DATA:  Shortness of breath and edema in the legs bilaterally. EXAM: CHEST  2 VIEW COMPARISON:  09/04/2016 FINDINGS: The lungs are clear wiithout focal pneumonia, edema, pneumothorax or pleural effusion. There is pulmonary vascular congestion without overt pulmonary edema. The cardio pericardial silhouette is enlarged. The visualized bony structures of the thorax are intact. IMPRESSION: Cardiomegaly with vascular congestion. Electronically Signed   By: Misty Stanley M.D.   On: 11/30/2016 19:14    Procedures Procedures (including critical care time)  Medications Ordered in ED Medications - No data to display   Initial Impression / Assessment and Plan / ED Course  I have reviewed the triage vital signs and the nursing  notes.  Pertinent labs & imaging results that were available  during my care of the patient were reviewed by me and considered in my medical decision making (see chart for details).     [name] is a morbidly obese 61 y.o. female with a PMHx of DM, heart failure on lasix, HLD, OSA, Heart Murmur, HTN, and Asthma, who presents to the Emergency Department by way of ambulance complaining of gradually worsening SOB and bilateral lower extremity edema onset four days ago.  History and exam are seen above.  On exam, patient has some crackles in the lower lungs. Patient has significant lower extremity edema which she reports is significantly worsened than last week.Patient does not have any erythema or unilateral swelling. Patient has nontender abdomen and nontender chest. Back nontender and no CVA tenderness.  Based on symptoms, suspect fluid overload in the setting of her heart failure. With the decreased urine output, suspect patient may have worsened kidney function.   Diagnostic testing showed no change in her prior EKG. Troponin negative. Laic acid nonelevated. Urinalysis showed no significant evidence of UTI with no dysuria. CBC showed no leukocytosis and anemia improved from prior. CMP showed similar kidney function to prior. Electrodes were normal. BNP slightly improved from prior at 107.  History x-ray shows cardiomegaly with vascular congestion with no overt pulmonary edema.  Given reassuring workup aside from the vascular congestion on chest x-ray, patient informed that she likely has some mild fluid overload that is not necessarily reflected in her lab testing. Patient was allowed to walk in the ED with a pulse oximeter and dropped to 81% and patient became very symptomatic with shortness of breath. Patient does not use oxygen at home.  Given the patient's hypoxia with ambulation, clinical concern for fluid overload with lower extremity edema and crackles in the lungs, suspect patient  needs diuresis.  Hospitalist team will be called for admission given the hypoxia with ambulation and concern for fluid overload. Patient will be admitted to hospitalist team. They requested Lasix administration prior to admission. This was ordered.    Final Clinical Impressions(s) / ED Diagnoses   Final diagnoses:  Hypoxia  SOB (shortness of breath)    I personally performed the services described in this documentation, which was scribed in my presence. The recorded information has been reviewed and is accurate.   Clinical Impression: 1. Hypoxia   2. SOB (shortness of breath)     Disposition: Admit to Hospitalist service      Courtney Paris, MD 11/30/16 2100

## 2016-11-30 NOTE — H&P (Signed)
History and Physical    [name] CNO:[id number] DOB: 01-14-1956 DOA: 11/30/2016  Referring MD/NP/PA: Dr. Sherry Ruffing PCP: Elyn Peers, MD  Patient coming from: Home  Chief Complaint: Couldn't breathe  HPI: [name] is a 61 y.o. female with medical history significant of a HTN, HLD, DM Type 2, asthma, obesity, OSA not on CPAP; who presents with complaints of progressively worsening shortness of breath over the last week. Patient reports being extremely winded even walking a few steps in her home. Associated symptoms include bilateral lower extremity swelling up to her thighs, nonproductive cough, weight gain of at least 10 pounds, orthopnea, and nausea.  Denies any chest pain, loss of consciousness, palpitations, vomiting, diarrhea, fever, or recent travel. Patient was last hospitalized in 08/2016 for similar symptoms for which she was started on 20 mg of Lasix and reports that she's been taking this as prescribed. However, she is not been set up with a cardiologist in outpatient setting.  ED Course: Upon admission into the emergency department patient was seen to be afebrile, pulse 49 - 58, respirations 12-20, blood pressure elevated up to 195/65, O2 saturations seen to drop to 86% with ambulation. Lab work revealed hemoglobin 11.5, BUN 26, creatinine 1.44, BNP 107, and troponin negative 2. Chest x-ray showed cardiomegaly and pulmonary vascular congestion.  Review of Systems: As per HPI otherwise 10 point review of systems negative.   Past Medical History:  Diagnosis Date  . Achilles rupture   . Anxiety   . Arthritis   . Asthma   . Chronic back pain   . Chronic headaches   . Chronic leg pain    bilaterally  . Depression   . Diabetes mellitus 1999   T2DM  . Glaucoma   . H/O gestational diabetes mellitus, not currently pregnant   . Heart murmur   . Hyperlipidemia   . Hypertension   . Obesity   . OSA (obstructive sleep apnea)   . Sciatica     Past Surgical History:    Procedure Laterality Date  . BACK SURGERY    . BREAST LUMPECTOMY     Right breast  . CERVICAL SPINE SURGERY    . CESAREAN SECTION  12/10/89  . CHOLECYSTECTOMY  1991  . LUMBAR SPINE SURGERY    . TUBAL LIGATION       reports that she quit smoking about 18 years ago. Her smoking use included Cigarettes. She has never used smokeless tobacco. She reports that she drinks alcohol. She reports that she does not use drugs.  Allergies  Allergen Reactions  . Pork-Derived Products Nausea And Vomiting    No family history on file.  Prior to Admission medications   Medication Sig Start Date End Date Taking? Authorizing Provider  acetaminophen (TYLENOL) 500 MG tablet Take 1,000 mg by mouth every 6 (six) hours as needed.   Yes Historical Provider, MD  albuterol (PROVENTIL HFA;VENTOLIN HFA) 108 (90 BASE) MCG/ACT inhaler Inhale 2 puffs into the lungs every 6 (six) hours as needed for wheezing or shortness of breath. For shortness of breath   Yes Robyn Haber, MD  carvedilol (COREG) 6.25 MG tablet Take 1 tablet (6.25 mg total) by mouth 2 (two) times daily with a meal. 09/06/16  Yes Velvet Bathe, MD  dorzolamide-timolol (COSOPT) 22.3-6.8 MG/ML ophthalmic solution Place 1 drop into both eyes 2 (two) times daily.   Yes Historical Provider, MD  doxazosin (CARDURA) 4 MG tablet Take 4 mg by mouth at bedtime.   Yes Historical Provider, MD  empagliflozin (JARDIANCE) 25 MG TABS tablet Take 25 mg by mouth every morning.    Yes Historical Provider, MD  furosemide (LASIX) 20 MG tablet Take 1 tablet (20 mg total) by mouth daily as needed. For edema Patient taking differently: Take 20 mg by mouth daily. For edema 09/06/16  Yes Velvet Bathe, MD  insulin glargine (LANTUS) 100 unit/mL SOPN Inject 0.35 mLs (35 Units total) into the skin at bedtime. Patient taking differently: Inject 40 Units into the skin at bedtime.  09/06/16  Yes Velvet Bathe, MD  potassium chloride SA (K-DUR,KLOR-CON) 20 MEQ tablet Take 20 mEq by  mouth every other day.  11/21/16  Yes Historical Provider, MD  Travoprost, BAK Free, (TRAVATAN) 0.004 % SOLN ophthalmic solution Place 1 drop into both eyes at bedtime.   Yes Historical Provider, MD  meloxicam (MOBIC) 15 MG tablet Take 15 mg by mouth daily. 11/23/16   Historical Provider, MD    Physical Exam:   Constitutional: Obese female who appears in mild distress  Vitals:   11/30/16 1645 11/30/16 1745 11/30/16 1915 11/30/16 2021  BP: (!) 153/51 (!) 154/57 (!) 195/65 (!) 175/60  Pulse: (!) 50 (!) 58 (!) 49 (!) 56  Resp: 12 15 18 18   Temp:      TempSrc:      SpO2: 100% 98% 99% 100%  Weight:      Height:       Eyes: PERRL, lids and conjunctivae normal ENMT: Mucous membranes are moist. Posterior pharynx clear of any exudate or lesions. Normal dentition.  Neck: normal, supple, no masses, no thyromegaly. Cannot appreciate JVD.  Respiratory:Positive crackles appreciated in the mid to lower lung fields. Normal respiratory effort. No accessory muscle use. Patient however talking in very shorten sentences. Cardiovascular: Regular rate and rhythm + SEM 2/6. No rubs / gallops.+3 pitting bilateral extremity edema that is appreciatedup to the mid thigh. 2+ pedal pulses. No carotid bruits.  Abdomen: no tenderness, no masses palpated. No hepatosplenomegaly. Bowel sounds positive.  Musculoskeletal: no clubbing / cyanosis. No joint deformity upper and lower extremities. Good ROM, no contractures. Normal muscle tone.  Skin: no rashes, lesions, ulcers. No induration Neurologic: CN 2-12 grossly intact. Sensation intact, DTR normal. Strength 5/5 in all 4.  Psychiatric: Normal judgment and insight. Alert and oriented x 3. Normal mood.     Labs on Admission: I have personally reviewed following labs and imaging studies  CBC:  Recent Labs Lab 11/30/16 1826  WBC 7.5  NEUTROABS 4.5  HGB 11.5*  HCT 37.1  MCV 82.4  PLT 540*   Basic Metabolic Panel:  Recent Labs Lab 11/30/16 1826  NA 140  K  4.4  CL 110  CO2 24  GLUCOSE 111*  BUN 26*  CREATININE 1.44*  CALCIUM 8.7*   GFR: Estimated Creatinine Clearance: 49.8 mL/min (A) (by C-G formula based on SCr of 1.44 mg/dL (H)). Liver Function Tests:  Recent Labs Lab 11/30/16 1826  AST 17  ALT 13*  ALKPHOS 72  BILITOT 0.3  PROT 7.0  ALBUMIN 3.3*   No results for input(s): LIPASE, AMYLASE in the last 168 hours. No results for input(s): AMMONIA in the last 168 hours. Coagulation Profile:  Recent Labs Lab 11/30/16 1826  INR 0.96   Cardiac Enzymes: No results for input(s): CKTOTAL, CKMB, CKMBINDEX, TROPONINI in the last 168 hours. BNP (last 3 results) No results for input(s): PROBNP in the last 8760 hours. HbA1C: No results for input(s): HGBA1C in the last 72 hours. CBG: No results for  input(s): GLUCAP in the last 168 hours. Lipid Profile: No results for input(s): CHOL, HDL, LDLCALC, TRIG, CHOLHDL, LDLDIRECT in the last 72 hours. Thyroid Function Tests: No results for input(s): TSH, T4TOTAL, FREET4, T3FREE, THYROIDAB in the last 72 hours. Anemia Panel: No results for input(s): VITAMINB12, FOLATE, FERRITIN, TIBC, IRON, RETICCTPCT in the last 72 hours. Urine analysis:    Component Value Date/Time   COLORURINE YELLOW 11/30/2016 1829   APPEARANCEUR CLEAR 11/30/2016 1829   LABSPEC 1.022 11/30/2016 1829   PHURINE 6.0 11/30/2016 1829   GLUCOSEU >=500 (A) 11/30/2016 1829   HGBUR NEGATIVE 11/30/2016 1829   HGBUR negative 08/20/2009 [place] 11/30/2016 1829   BILIRUBINUR neg 05/14/2013 1111   KETONESUR NEGATIVE 11/30/2016 1829   PROTEINUR NEGATIVE 11/30/2016 1829   UROBILINOGEN 1.0 11/11/2014 1215   NITRITE NEGATIVE 11/30/2016 1829   LEUKOCYTESUR TRACE (A) 11/30/2016 1829   Sepsis Labs: No results found for this or any previous visit (from the past 240 hour(s)).   Radiological Exams on Admission: Dg Chest 2 View  Result Date: 11/30/2016 CLINICAL DATA:  Shortness of breath and edema in the  legs bilaterally. EXAM: CHEST  2 VIEW COMPARISON:  09/04/2016 FINDINGS: The lungs are clear wiithout focal pneumonia, edema, pneumothorax or pleural effusion. There is pulmonary vascular congestion without overt pulmonary edema. The cardio pericardial silhouette is enlarged. The visualized bony structures of the thorax are intact. IMPRESSION: Cardiomegaly with vascular congestion. Electronically Signed   By: Misty Stanley M.D.   On: 11/30/2016 19:14    EKG: Independently reviewed. Sinus bradycardia   Assessment/Plan Hypoxia suspected secondary to diastolic CHF exacerbation: Acute. Patient presents with worsening shortness of breath. Noted to be hypoxic with ambulation to 86% on room air. BNP 107, but patient is obese. Chest x-ray showing cardiomegaly with vascular congestion. Last echocardiogram was in 08/2016 showing EF of 60- 65% with signs of systolic anterior motion without signs of outflow obstruction but features suggestive of HOCM. - Admit to a telemetry bed - Heart failure orders set  initiated  - Continuous pulse oximetry with nasal cannula oxygen as needed to keep O2 saturations >92% - Strict I&Os and daily weights - Elevate lower extremities - Check d-dimer and TSH - Lasix 40 mg IV Bid - Reassess in a.m. and adjust diuresis as needed. - Optimize medical management - Consultation to cardiology in a.m.   Sinus bradycardia: On admission heart rates seen to be in the 40-50s. Patient reports that her Coreg had previously been decreased from 25 mg twice a day to 6.25. Patient's last dose was this a.m.  - Held Coreg.  - Follow-up telemetry overnight - Will need to question cardiology on need of dose reduction  Diabetes mellitus type 2: Initial blood sugar 111 on admission. Patient on insulin and Jardiance. - Hypoglycemic protocols - Held Jardiance as there is recommendation for discontinuation if decreased GFR < 45.  - Halved home Lantus dose to 20 units subcutaneous qhs as patient in  more controlled environment  Anemia : Chronic. Initial hemoglobin 11.5. baseline hemoglobin appears to be around 10. - Continue to monitor.   Thrombocytopenia :acute.  Platelet count 140 on admission previously seen as low as 151 for in the past. - Continue to monitor  Essential hypertension - Continue Cardura    Obesity  Chronic kidney disease stage III: Stable. - Continue to monitor   OSA - Follow-up with outpatient sleep study  DVT prophylaxis: SCDs Code Status: Full Family Communication: No family present at bedside  Disposition Plan: Possible discharge home 1-2 days Consults called: None Admission status: Observation   Norval Morton MD [facility] Pager [phone]  If 7PM-7AM, please contact night-coverage [email or web address] Password [facility]  11/30/2016, 8:43 PM

## 2016-11-30 NOTE — Progress Notes (Signed)
Pt's CBG 58, MD notified gave graham crackers PB and OJ went back to 119, will continue to monitor, Thanks Arvella Nigh RN

## 2016-12-01 DIAGNOSIS — E782 Mixed hyperlipidemia: Secondary | ICD-10-CM

## 2016-12-01 LAB — GLUCOSE, CAPILLARY
GLUCOSE-CAPILLARY: 161 mg/dL — AB (ref 65–99)
GLUCOSE-CAPILLARY: 195 mg/dL — AB (ref 65–99)
GLUCOSE-CAPILLARY: 202 mg/dL — AB (ref 65–99)
Glucose-Capillary: 128 mg/dL — ABNORMAL HIGH (ref 65–99)
Glucose-Capillary: 222 mg/dL — ABNORMAL HIGH (ref 65–99)

## 2016-12-01 LAB — CBC WITH DIFFERENTIAL/PLATELET
Basophils Absolute: 0 10*3/uL (ref 0.0–0.1)
Basophils Relative: 0 %
Eosinophils Absolute: 0.3 10*3/uL (ref 0.0–0.7)
Eosinophils Relative: 4 %
HCT: 34 % — ABNORMAL LOW (ref 36.0–46.0)
Hemoglobin: 10.4 g/dL — ABNORMAL LOW (ref 12.0–15.0)
LYMPHS ABS: 1.9 10*3/uL (ref 0.7–4.0)
LYMPHS PCT: 27 %
MCH: 25.4 pg — AB (ref 26.0–34.0)
MCHC: 30.6 g/dL (ref 30.0–36.0)
MCV: 82.9 fL (ref 78.0–100.0)
Monocytes Absolute: 0.6 10*3/uL (ref 0.1–1.0)
Monocytes Relative: 9 %
NEUTROS ABS: 4.2 10*3/uL (ref 1.7–7.7)
Neutrophils Relative %: 60 %
PLATELETS: 175 10*3/uL (ref 150–400)
RBC: 4.1 MIL/uL (ref 3.87–5.11)
RDW: 16.4 % — ABNORMAL HIGH (ref 11.5–15.5)
WBC: 7 10*3/uL (ref 4.0–10.5)

## 2016-12-01 LAB — BASIC METABOLIC PANEL
ANION GAP: 5 (ref 5–15)
BUN: 29 mg/dL — AB (ref 6–20)
CHLORIDE: 110 mmol/L (ref 101–111)
CO2: 24 mmol/L (ref 22–32)
Calcium: 8.5 mg/dL — ABNORMAL LOW (ref 8.9–10.3)
Creatinine, Ser: 1.41 mg/dL — ABNORMAL HIGH (ref 0.44–1.00)
GFR calc Af Amer: 46 mL/min — ABNORMAL LOW (ref >60)
GFR calc non Af Amer: 40 mL/min — ABNORMAL LOW (ref >60)
GLUCOSE: 152 mg/dL — AB (ref 65–99)
Potassium: 4.4 mmol/L (ref 3.5–5.1)
SODIUM: 139 mmol/L (ref 135–145)

## 2016-12-01 MED ORDER — ENOXAPARIN SODIUM 30 MG/0.3ML ~~LOC~~ SOLN
30.0000 mg | SUBCUTANEOUS | Status: DC
  Filled 2016-12-01: qty 0.3

## 2016-12-01 MED ORDER — ORAL CARE MOUTH RINSE
15.0000 mL | Freq: Two times a day (BID) | OROMUCOSAL | Status: DC
  Administered 2016-12-01 – 2016-12-07 (×9): 15 mL via OROMUCOSAL

## 2016-12-01 MED ORDER — ENOXAPARIN SODIUM 40 MG/0.4ML ~~LOC~~ SOLN
40.0000 mg | SUBCUTANEOUS | Status: DC
  Administered 2016-12-03: 40 mg via SUBCUTANEOUS
  Filled 2016-12-01: qty 0.4

## 2016-12-01 NOTE — Progress Notes (Signed)
[facility] PROGRESS NOTE  [name] FAO:[id number] DOB: [birth year] DOA: 11/30/2016   PCP: Elyn Peers, MD     Assessment/Plan: Principal Problem:   Diastolic CHF, acute on chronic (HCC) Active Problems:   Hyperlipidemia   Obesity   Iron deficiency anemia   OSA (obstructive sleep apnea)   Hypertension   CKD (chronic kidney disease) stage 3, GFR 30-59 ml/min   Thrombocytopenia (HCC)   Sinus bradycardia   61 y.o. female with medical history significant of a HTN, HLD, DM Type 2, asthma, obesity, OSA not on CPAP; who presents with complaints of progressively worsening shortness of breath over the last week.Chest x-ray showed cardiomegaly and pulmonary vascular congestion. Patient admitted for CHF exacerbation. Admitted and discharged 09/06/16 for the same  Assessment and plan Acute on chronic diastolic CHF exacerbation:   presents with worsening shortness of breath. Noted to be hypoxic with ambulation to 86% on room air. BNP 107, but patient is obese. Chest x-ray showing cardiomegaly with vascular congestion. Last echocardiogram was in 08/2016 showing EF of 60- 65% with signs of systolic anterior motion without signs of outflow obstruction but features suggestive of HOCM.  Continue telemetry data shows normal sinus rhythm, - Heart failure orders set  initiated  - Continuous pulse oximetry with nasal cannula oxygen as needed to keep O2 saturations >92% - Strict I&Os and daily weights-down 259>254 pounds - Elevate lower extremities TSH normal, d-dimer negative -Continue Lasix 40 mg IV Bid  features of HOCM, normal LV function on 2-D echo Cardiology has been consulted   Sinus bradycardia: On admission heart rates seen to be in the 40-50s. Patient reports that her Coreg had previously been decreased from 25 mg twice a day to 6.25. Patient's last dose was this a.m.  Resume Coreg his heart rate tolerates at a lower dose   Diabetes mellitus type 2: I  . Patient on  insulin and Jardiance. - Hypoglycemic protocols, Accu-Chek stable - Held Jardiance as there is recommendation for discontinuation if decreased GFR < 45.  Continue  Lantus  20 units subcutaneous qhs     Anemia : Chronic. Initial hemoglobin 11.5. baseline hemoglobin appears to be around 10. - Continue to monitor.   Thrombocytopenia :acute.  Platelet count 140 on admission previously seen as low as 151 for in the past. - Continue to monitor  Essential hypertension - Continue Cardura    Obesity  Chronic kidney disease stage III: Stable. - Continue to monitor , baseline creatinine appears to be around 1.4  OSA - Follow-up with outpatient sleep study     DVT prophylaxsis Lovenox  Code Status:  Full code    Family Communication: Discussed in detail with the patient, all imaging results, lab results explained to the patient   Disposition Plan:  Likely 1-2 days     Consultants:  Cardiology  Procedures:  None  Antibiotics: Anti-infectives    None         HPI/Subjective: Patient had a syncopal episode and then stay, subsequently has had severe shortness of breath with worsening bilateral lower extremity edema  Objective: Vitals:   11/30/16 2255 12/01/16 0017 12/01/16 0624 12/01/16 0816  BP: (!) 150/47 (!) 159/58 (!) 139/47 (!) 129/53  Pulse: (!) 52 (!) 58 (!) 52 (!) 52  Resp: 18 (!) 22 20 19   Temp: 98 F (36.7 C) 97.6 F (36.4 C) 98.4 F (36.9 C)   TempSrc: Oral Oral Oral   SpO2: 99% 100% 100% 100%  Weight:  116.6 kg (257 lb 1.6 oz)  115.6 kg (254 lb 14.4 oz)   Height: 5\' 1"  (1.549 m)       Intake/Output Summary (Last 24 hours) at 12/01/16 0837 Last data filed at 12/01/16 7867  Gross per 24 hour  Intake              240 ml  Output             1025 ml  Net             -785 ml    Exam:  Examination:  General exam: Appears calm and comfortable  Respiratory system: Clear to auscultation. Respiratory effort normal. Cardiovascular system: S1  & S2 heard, RRR. No JVD, murmurs, rubs, gallops or clicks.   Gastrointestinal system: Abdomen is nondistended, soft and nontender. No organomegaly or masses felt. Normal bowel sounds heard. Central nervous system: Alert and oriented. No focal neurological deficits. Extremities: Symmetric 5 x 5 power.3+ pitting edema Skin: No rashes, lesions or ulcers Psychiatry: Judgement and insight appear normal. Mood & affect appropriate.     Data Reviewed: I have personally reviewed following labs and imaging studies  Micro Results No results found for this or any previous visit (from the past 240 hour(s)).  Radiology Reports Dg Chest 2 View  Result Date: 11/30/2016 CLINICAL DATA:  Shortness of breath and edema in the legs bilaterally. EXAM: CHEST  2 VIEW COMPARISON:  09/04/2016 FINDINGS: The lungs are clear wiithout focal pneumonia, edema, pneumothorax or pleural effusion. There is pulmonary vascular congestion without overt pulmonary edema. The cardio pericardial silhouette is enlarged. The visualized bony structures of the thorax are intact. IMPRESSION: Cardiomegaly with vascular congestion. Electronically Signed   By: Misty Stanley M.D.   On: 11/30/2016 19:14     CBC  Recent Labs Lab 11/30/16 1826 12/01/16 0540  WBC 7.5 7.0  HGB 11.5* 10.4*  HCT 37.1 34.0*  PLT 140* 175  MCV 82.4 82.9  MCH 25.6* 25.4*  MCHC 31.0 30.6  RDW 15.9* 16.4*  LYMPHSABS 2.0 1.9  MONOABS 0.5 0.6  EOSABS 0.3 0.3  BASOSABS 0.2* 0.0    Chemistries   Recent Labs Lab 11/30/16 1826 12/01/16 0540  NA 140 139  K 4.4 4.4  CL 110 110  CO2 24 24  GLUCOSE 111* 152*  BUN 26* 29*  CREATININE 1.44* 1.41*  CALCIUM 8.7* 8.5*  AST 17  --   ALT 13*  --   ALKPHOS 72  --   BILITOT 0.3  --    ------------------------------------------------------------------------------------------------------------------ estimated creatinine clearance is 50.2 mL/min (A) (by C-G formula based on SCr of 1.41 mg/dL  (H)). ------------------------------------------------------------------------------------------------------------------ No results for input(s): HGBA1C in the last 72 hours. ------------------------------------------------------------------------------------------------------------------ No results for input(s): CHOL, HDL, LDLCALC, TRIG, CHOLHDL, LDLDIRECT in the last 72 hours. ------------------------------------------------------------------------------------------------------------------  Recent Labs  11/30/16 2202  TSH 3.247   ------------------------------------------------------------------------------------------------------------------ No results for input(s): VITAMINB12, FOLATE, FERRITIN, TIBC, IRON, RETICCTPCT in the last 72 hours.  Coagulation profile  Recent Labs Lab 11/30/16 1826  INR 0.96     Recent Labs  11/30/16 1826  DDIMER 0.45    Cardiac Enzymes No results for input(s): CKMB, TROPONINI, MYOGLOBIN in the last 168 hours.  Invalid input(s): CK ------------------------------------------------------------------------------------------------------------------ Invalid input(s): POCBNP   CBG:  Recent Labs Lab 11/30/16 2309 11/30/16 2345 12/01/16 0454 12/01/16 0737  GLUCAP 58* 119* 161* 128*       Studies: Dg Chest 2 View  Result Date: 11/30/2016 CLINICAL DATA:  Shortness of breath  and edema in the legs bilaterally. EXAM: CHEST  2 VIEW COMPARISON:  09/04/2016 FINDINGS: The lungs are clear wiithout focal pneumonia, edema, pneumothorax or pleural effusion. There is pulmonary vascular congestion without overt pulmonary edema. The cardio pericardial silhouette is enlarged. The visualized bony structures of the thorax are intact. IMPRESSION: Cardiomegaly with vascular congestion. Electronically Signed   By: Misty Stanley M.D.   On: 11/30/2016 19:14      Lab Results  Component Value Date   HGBA1C 13.5 01/22/2013   HGBA1C 10.2 08/20/2009   HGBA1C  10.5 07/02/2009   Lab Results  Component Value Date   MICROALBUR 2.45 (H) 05/03/2010   LDLCALC 120 (H) 05/02/2010   CREATININE 1.41 (H) 12/01/2016       Scheduled Meds: . dorzolamide  1 drop Both Eyes BID  . doxazosin  4 mg Oral QHS  . furosemide  20 mg Intravenous BID  . insulin aspart  0-9 Units Subcutaneous TID WC  . insulin glargine  20 Units Subcutaneous QHS  . latanoprost  1 drop Both Eyes QHS  . potassium chloride SA  20 mEq Oral QODAY  . sodium chloride flush  3 mL Intravenous Q12H  . timolol  1 drop Both Eyes BID   Continuous Infusions:   LOS: 1 day    Time spent: >30 MINS    [facility]  [facility] Pager [phone]. If 7PM-7AM, please contact night-coverage at [email or web address], password [facility] 12/01/2016, 8:37 AM  LOS: 1 day

## 2016-12-02 ENCOUNTER — Encounter (HOSPITAL_COMMUNITY): Payer: Self-pay | Admitting: Internal Medicine

## 2016-12-02 ENCOUNTER — Inpatient Hospital Stay (HOSPITAL_COMMUNITY): Payer: Medicaid Other

## 2016-12-02 DIAGNOSIS — R0602 Shortness of breath: Secondary | ICD-10-CM

## 2016-12-02 DIAGNOSIS — R55 Syncope and collapse: Secondary | ICD-10-CM

## 2016-12-02 DIAGNOSIS — I130 Hypertensive heart and chronic kidney disease with heart failure and stage 1 through stage 4 chronic kidney disease, or unspecified chronic kidney disease: Principal | ICD-10-CM

## 2016-12-02 DIAGNOSIS — N183 Chronic kidney disease, stage 3 (moderate): Secondary | ICD-10-CM

## 2016-12-02 DIAGNOSIS — R001 Bradycardia, unspecified: Secondary | ICD-10-CM

## 2016-12-02 DIAGNOSIS — N170 Acute kidney failure with tubular necrosis: Secondary | ICD-10-CM

## 2016-12-02 DIAGNOSIS — I5033 Acute on chronic diastolic (congestive) heart failure: Secondary | ICD-10-CM

## 2016-12-02 LAB — ECHOCARDIOGRAM COMPLETE
E/e' ratio: 20.25
FS: 37 % (ref 28–44)
Height: 61 in
IV/PV OW: 0.92
LA diam end sys: 39 mm
LA vol: 46.8 mL
LADIAMINDEX: 1.87 cm/m2
LASIZE: 39 mm
LAVOLA4C: 50.8 mL
LAVOLIN: 22.4 mL/m2
LDCA: 1.77 cm2
LV E/e' medial: 20.25
LV E/e'average: 20.25
LV TDI E'LATERAL: 6.42
LVELAT: 6.42 cm/s
LVOT SV: 76 mL
LVOT VTI: 43.1 cm
LVOT peak grad rest: 10 mmHg
LVOT peak vel: 162 cm/s
LVOTD: 15 mm
Lateral S' vel: 14.1 cm/s
MV Peak grad: 7 mmHg
MV pk E vel: 130 m/s
MVPKAVEL: 75.9 m/s
PW: 13 mm — AB (ref 0.6–1.1)
TAPSE: 27.2 mm
TDI e' medial: 4.9
Weight: 4059.2 oz

## 2016-12-02 LAB — BASIC METABOLIC PANEL
ANION GAP: 6 (ref 5–15)
BUN: 34 mg/dL — ABNORMAL HIGH (ref 6–20)
CALCIUM: 8.6 mg/dL — AB (ref 8.9–10.3)
CHLORIDE: 107 mmol/L (ref 101–111)
CO2: 23 mmol/L (ref 22–32)
Creatinine, Ser: 1.57 mg/dL — ABNORMAL HIGH (ref 0.44–1.00)
GFR, EST AFRICAN AMERICAN: 40 mL/min — AB (ref >60)
GFR, EST NON AFRICAN AMERICAN: 35 mL/min — AB (ref >60)
Glucose, Bld: 272 mg/dL — ABNORMAL HIGH (ref 65–99)
POTASSIUM: 4.6 mmol/L (ref 3.5–5.1)
Sodium: 136 mmol/L (ref 135–145)

## 2016-12-02 LAB — GLUCOSE, CAPILLARY
GLUCOSE-CAPILLARY: 195 mg/dL — AB (ref 65–99)
GLUCOSE-CAPILLARY: 229 mg/dL — AB (ref 65–99)
Glucose-Capillary: 202 mg/dL — ABNORMAL HIGH (ref 65–99)
Glucose-Capillary: 223 mg/dL — ABNORMAL HIGH (ref 65–99)

## 2016-12-02 MED ORDER — INSULIN GLARGINE 100 UNIT/ML ~~LOC~~ SOLN
7.0000 [IU] | Freq: Once | SUBCUTANEOUS | Status: AC
  Administered 2016-12-02: 7 [IU] via SUBCUTANEOUS
  Filled 2016-12-02: qty 0.07

## 2016-12-02 MED ORDER — FUROSEMIDE 10 MG/ML IJ SOLN
40.0000 mg | Freq: Two times a day (BID) | INTRAMUSCULAR | Status: DC
  Administered 2016-12-02 – 2016-12-03 (×2): 40 mg via INTRAVENOUS
  Filled 2016-12-02 (×2): qty 4

## 2016-12-02 MED ORDER — INSULIN GLARGINE 100 UNIT/ML ~~LOC~~ SOLN
25.0000 [IU] | Freq: Every day | SUBCUTANEOUS | Status: DC
  Administered 2016-12-02: 25 [IU] via SUBCUTANEOUS
  Filled 2016-12-02: qty 0.25

## 2016-12-02 MED ORDER — LISINOPRIL 10 MG PO TABS
10.0000 mg | ORAL_TABLET | Freq: Every day | ORAL | Status: DC
  Administered 2016-12-02 – 2016-12-03 (×2): 10 mg via ORAL
  Filled 2016-12-02 (×2): qty 1

## 2016-12-02 NOTE — Progress Notes (Addendum)
Patient with no complaints or concerns during 7pm - 7am shift. Slept during the night. Pain medication given for back pain and effective. Will continue to monitor.   Jhalen Eley, RN

## 2016-12-02 NOTE — Progress Notes (Signed)
  Echocardiogram 2D Echocardiogram has been performed.  [name] 12/02/2016, 9:50 AM

## 2016-12-02 NOTE — Progress Notes (Signed)
[facility] PROGRESS NOTE  [name] IHW:[id number] DOB: April 08, 1956 DOA: 11/30/2016   PCP: Elyn Peers, MD     Assessment/Plan: Principal Problem:   Diastolic CHF, acute on chronic (HCC) Active Problems:   Hyperlipidemia   Obesity   Iron deficiency anemia   OSA (obstructive sleep apnea)   Hypertension   CKD (chronic kidney disease) stage 3, GFR 30-59 ml/min   Thrombocytopenia (HCC)   Sinus bradycardia   61 y.o. female with medical history significant of a HTN, HLD, DM Type 2, asthma, obesity, OSA not on CPAP; who presents with complaints of progressively worsening shortness of breath over the last week.patient has also had a syncopal episode without any warning few days ago prior to admission. Chest x-ray showed cardiomegaly and pulmonary vascular congestion. Patient admitted for CHF exacerbation. Admitted and discharged 09/06/16 for the same  Assessment and plan Acute on chronic diastolic CHF exacerbation:   presents with worsening shortness of breath. Noted to be hypoxic with ambulation to 86% on room air. BNP 107, but patient is obese. Chest x-ray showing cardiomegaly with vascular congestion. Last echocardiogram was in 08/2016 showing EF of 60- 65% with signs of systolic anterior motion without signs of outflow obstruction but features suggestive of HOCM.  Continue telemetry data shows normal sinus rhythm, - Heart failure orders set  initiated  keep O2 saturations >92% - Strict I&Os and daily weights-down 259>253 pounds,3300 CC , put in the last 24 hours Increase Lasix to 40 mg IV twice a day TSH normal, d-dimer negative  features of HOCM, normal LV function on 2-D echo, repeat 2-D echo Cardiology has been consulted 3/16   Sinus bradycardia: Improving, heart rate in the 60s Patient reports that her Coreg had previously been decreased from 25 mg twice a day to 6.25. Patient's last dose was this a.m.  Resume Coreg as heart rate allows   Diabetes mellitus  type 2: I  . Patient on insulin and Jardiance. - Hypoglycemic protocols, Accu-Chek stable - Held Jardiance as there is recommendation for discontinuation if decreased GFR < 45.  Increase Lantus to 25 units subcutaneous qhs   Check hemoglobin A1c  Anemia : Chronic. Initial hemoglobin 11.5. baseline hemoglobin appears to be around 10. - Continue to monitor.   Thrombocytopenia :acute.  Platelet count 140 on admission previously seen as low as 151 for in the past. - Continue to monitor  Essential hypertension - Continue Cardura    Obesity  Chronic kidney disease stage III: Stable. - Continue to monitor , baseline creatinine appears to be around 1.4  OSA - Follow-up with outpatient sleep study     DVT prophylaxsis Lovenox  Code Status:  Full code    Family Communication: Discussed in detail with the patient, all imaging results, lab results explained to the patient   Disposition Plan:  Likely 1-2 days     Consultants:  Cardiology  Procedures:  None  Antibiotics: Anti-infectives    None         HPI/Subjective: Patient had a syncopal episode and then stay, subsequently has had severe shortness of breath with worsening bilateral lower extremity edema  Objective: Vitals:   12/01/16 2019 12/01/16 2232 12/02/16 0000 12/02/16 0509  BP: (!) 150/54 (!) 152/61 (!) 138/48 (!) 133/47  Pulse: (!) 51  (!) 54 (!) 54  Resp: 20  18 18   Temp: 98.8 F (37.1 C)  98.3 F (36.8 C) 98.4 F (36.9 C)  TempSrc: Oral  Oral Oral  SpO2: 100%  99% 100%  Weight:    115.1 kg (253 lb 11.2 oz)  Height:        Intake/Output Summary (Last 24 hours) at 12/02/16 0754 Last data filed at 12/02/16 0535  Gross per 24 hour  Intake              630 ml  Output             3300 ml  Net            -2670 ml    Exam:  Examination:  General exam: Appears calm and comfortable  Respiratory system: Clear to auscultation. Respiratory effort normal. Cardiovascular system: S1 & S2  heard, RRR. No JVD, murmurs, rubs, gallops or clicks.   Gastrointestinal system: Abdomen is nondistended, soft and nontender. No organomegaly or masses felt. Normal bowel sounds heard. Central nervous system: Alert and oriented. No focal neurological deficits. Extremities: Symmetric 5 x 5 power.3+ pitting edema Skin: No rashes, lesions or ulcers Psychiatry: Judgement and insight appear normal. Mood & affect appropriate.     Data Reviewed: I have personally reviewed following labs and imaging studies  Micro Results No results found for this or any previous visit (from the past 240 hour(s)).  Radiology Reports Dg Chest 2 View  Result Date: 11/30/2016 CLINICAL DATA:  Shortness of breath and edema in the legs bilaterally. EXAM: CHEST  2 VIEW COMPARISON:  09/04/2016 FINDINGS: The lungs are clear wiithout focal pneumonia, edema, pneumothorax or pleural effusion. There is pulmonary vascular congestion without overt pulmonary edema. The cardio pericardial silhouette is enlarged. The visualized bony structures of the thorax are intact. IMPRESSION: Cardiomegaly with vascular congestion. Electronically Signed   By: Misty Stanley M.D.   On: 11/30/2016 19:14     CBC  Recent Labs Lab 11/30/16 1826 12/01/16 0540  WBC 7.5 7.0  HGB 11.5* 10.4*  HCT 37.1 34.0*  PLT 140* 175  MCV 82.4 82.9  MCH 25.6* 25.4*  MCHC 31.0 30.6  RDW 15.9* 16.4*  LYMPHSABS 2.0 1.9  MONOABS 0.5 0.6  EOSABS 0.3 0.3  BASOSABS 0.2* 0.0    Chemistries   Recent Labs Lab 11/30/16 1826 12/01/16 0540 12/02/16 0436  NA 140 139 136  K 4.4 4.4 4.6  CL 110 110 107  CO2 24 24 23   GLUCOSE 111* 152* 272*  BUN 26* 29* 34*  CREATININE 1.44* 1.41* 1.57*  CALCIUM 8.7* 8.5* 8.6*  AST 17  --   --   ALT 13*  --   --   ALKPHOS 72  --   --   BILITOT 0.3  --   --    ------------------------------------------------------------------------------------------------------------------ estimated creatinine clearance is 44.9 mL/min  (A) (by C-G formula based on SCr of 1.57 mg/dL (H)). ------------------------------------------------------------------------------------------------------------------ No results for input(s): HGBA1C in the last 72 hours. ------------------------------------------------------------------------------------------------------------------ No results for input(s): CHOL, HDL, LDLCALC, TRIG, CHOLHDL, LDLDIRECT in the last 72 hours. ------------------------------------------------------------------------------------------------------------------  Recent Labs  11/30/16 2202  TSH 3.247   ------------------------------------------------------------------------------------------------------------------ No results for input(s): VITAMINB12, FOLATE, FERRITIN, TIBC, IRON, RETICCTPCT in the last 72 hours.  Coagulation profile  Recent Labs Lab 11/30/16 1826  INR 0.96     Recent Labs  11/30/16 1826  DDIMER 0.45    Cardiac Enzymes No results for input(s): CKMB, TROPONINI, MYOGLOBIN in the last 168 hours.  Invalid input(s): CK ------------------------------------------------------------------------------------------------------------------ Invalid input(s): POCBNP   CBG:  Recent Labs Lab 12/01/16 0737 12/01/16 1125 12/01/16 1631 12/01/16 2052 12/02/16 0729  GLUCAP 128* 222* 195*  202* 195*       Studies: Dg Chest 2 View  Result Date: 11/30/2016 CLINICAL DATA:  Shortness of breath and edema in the legs bilaterally. EXAM: CHEST  2 VIEW COMPARISON:  09/04/2016 FINDINGS: The lungs are clear wiithout focal pneumonia, edema, pneumothorax or pleural effusion. There is pulmonary vascular congestion without overt pulmonary edema. The cardio pericardial silhouette is enlarged. The visualized bony structures of the thorax are intact. IMPRESSION: Cardiomegaly with vascular congestion. Electronically Signed   By: Misty Stanley M.D.   On: 11/30/2016 19:14      Lab Results  Component Value  Date   HGBA1C 13.5 01/22/2013   HGBA1C 10.2 08/20/2009   HGBA1C 10.5 07/02/2009   Lab Results  Component Value Date   MICROALBUR 2.45 (H) 05/03/2010   LDLCALC 120 (H) 05/02/2010   CREATININE 1.57 (H) 12/02/2016       Scheduled Meds: . dorzolamide  1 drop Both Eyes BID  . doxazosin  4 mg Oral QHS  . enoxaparin (LOVENOX) injection  40 mg Subcutaneous Q24H  . furosemide  20 mg Intravenous BID  . insulin aspart  0-9 Units Subcutaneous TID WC  . insulin glargine  20 Units Subcutaneous QHS  . latanoprost  1 drop Both Eyes QHS  . mouth rinse  15 mL Mouth Rinse BID  . potassium chloride SA  20 mEq Oral QODAY  . sodium chloride flush  3 mL Intravenous Q12H  . timolol  1 drop Both Eyes BID   Continuous Infusions:   LOS: 2 days    Time spent: >30 MINS    [facility]  [facility] Pager [phone]. If 7PM-7AM, please contact night-coverage at [email or web address], password [facility] 12/02/2016, 7:54 AM  LOS: 2 days

## 2016-12-02 NOTE — Consult Note (Addendum)
CARDIOLOGY CONSULT NOTE     Primary Care Physician: Elyn Peers, MD Referring Physician:  Dr Allyson Sabal  Admit Date: 11/30/2016  Reason for consultation: volume overload in the setting of morbid obesity and renal failure  [name] is a 61 y.o. female with a h/o morbid obesity (Body mass index is 47.94 kg/m.) and multiple secondary illnesses related to morbid obesity including sleep apnea, DJD/DDD, DM, HTN, and volume overload.  She is now admitted with worsening volume overload.  She reports that recently, she has been taking meloxicam.  She thinks that this could have contributed to her symptoms.  She reports that she is "trying to eat better".  + edema and abdominal distension  Today, she denies symptoms of palpitations, chest pain,  dizziness, presyncope, syncope, or neurologic sequela. The patient is tolerating medications without difficulties and is otherwise without complaint today.   Past Medical History:  Diagnosis Date  . Achilles rupture   . Anxiety   . Arthritis   . Asthma   . Chronic back pain   . Chronic headaches   . Chronic leg pain    bilaterally  . Depression   . Diabetes mellitus 1999   T2DM  . Glaucoma   . H/O gestational diabetes mellitus, not currently pregnant   . Hyperlipidemia   . Hypertension   . Morbid obesity ([place])   . OSA (obstructive sleep apnea)   . Sciatica    Past Surgical History:  Procedure Laterality Date  . BACK SURGERY    . BREAST LUMPECTOMY     Right breast  . CERVICAL SPINE SURGERY    . CESAREAN SECTION  12/10/89  . CHOLECYSTECTOMY  1991  . LUMBAR SPINE SURGERY    . TUBAL LIGATION      . dorzolamide  1 drop Both Eyes BID  . doxazosin  4 mg Oral QHS  . enoxaparin (LOVENOX) injection  40 mg Subcutaneous Q24H  . furosemide  40 mg Intravenous BID  . insulin aspart  0-9 Units Subcutaneous TID WC  . insulin glargine  25 Units Subcutaneous QHS  . latanoprost  1 drop Both Eyes QHS  . mouth rinse  15 mL Mouth Rinse BID  .  potassium chloride SA  20 mEq Oral QODAY  . sodium chloride flush  3 mL Intravenous Q12H  . timolol  1 drop Both Eyes BID     Allergies  Allergen Reactions  . Pork-Derived Products Nausea And Vomiting    Social History   Social History  . Marital status: Single    Spouse name: N/A  . Number of children: N/A  . Years of education: N/A   Occupational History  . Not on file.   Social History Main Topics  . Smoking status: Former Smoker    Types: Cigarettes    Quit date: 09/04/1998  . Smokeless tobacco: Never Used  . Alcohol use Yes     Comment: occasional  . Drug use: No  . Sexual activity: Yes    Birth control/ protection: None   Other Topics Concern  . Not on file   Social History Narrative  . No narrative on file   FH- HTN  ROS- All systems are reviewed and negative except as per the HPI above  Physical Exam: Telemetry: Vitals:   12/01/16 2019 12/01/16 2232 12/02/16 0000 12/02/16 0509  BP: (!) 150/54 (!) 152/61 (!) 138/48 (!) 133/47  Pulse: (!) 51  (!) 54 (!) 54  Resp: 20  18 18   Temp: 98.8  F (37.1 C)  98.3 F (36.8 C) 98.4 F (36.9 C)  TempSrc: Oral  Oral Oral  SpO2: 100%  99% 100%  Weight:    253 lb 11.2 oz (115.1 kg)  Height:        GEN- The patient is morbidly obese appearing, alert and oriented x 3 today.   Head- normocephalic, atraumatic Eyes-  Sclera clear, conjunctiva pink Ears- hearing intact Oropharynx- clear Neck- supple,   Lungs- Clear to ausculation bilaterally, normal work of breathing Heart- Regular rate and rhythm, no murmurs, rubs or gallops, PMI not laterally displaced GI- soft, NT, ND, + BS Extremities- no clubbing, cyanosis, +1 edema MS- no significant deformity or atrophy Skin- no rash or lesion Psych- euthymic mood, full affect Neuro- strength and sensation are intact  EKG reveals sinus bradycardia  Labs:   Lab Results  Component Value Date   WBC 7.0 12/01/2016   HGB 10.4 (L) 12/01/2016   HCT 34.0 (L) 12/01/2016    MCV 82.9 12/01/2016   PLT 175 12/01/2016    Recent Labs Lab 11/30/16 1826  12/02/16 0436  NA 140  < > 136  K 4.4  < > 4.6  CL 110  < > 107  CO2 24  < > 23  BUN 26*  < > 34*  CREATININE 1.44*  < > 1.57*  CALCIUM 8.7*  < > 8.6*  PROT 7.0  --   --   BILITOT 0.3  --   --   ALKPHOS 72  --   --   ALT 13*  --   --   AST 17  --   --   GLUCOSE 111*  < > 272*  < > = values in this interval not displayed. Lab Results  Component Value Date   XQJJHER 740 (H) 09/30/2008   CKMB 1.2 09/30/2008   TROPONINI <0.03 09/05/2016    Lab Results  Component Value Date   CHOL 197 05/02/2010   CHOL 154 08/20/2009   CHOL  09/30/2008    134        ATP III CLASSIFICATION:  <200     mg/dL   Desirable  200-239  mg/dL   Borderline High  >=240    mg/dL   High          Lab Results  Component Value Date   HDL 59 05/02/2010   HDL 61 08/20/2009   HDL 65 09/30/2008   Lab Results  Component Value Date   LDLCALC 120 (H) 05/02/2010   LDLCALC 71 08/20/2009   LDLCALC  09/30/2008    54        Total Cholesterol/HDL:CHD Risk Coronary Heart Disease Risk Table                     Men   Women  1/2 Average Risk   3.4   3.3  Average Risk       5.0   4.4  2 X Average Risk   9.6   7.1  3 X Average Risk  23.4   11.0        Use the calculated Patient Ratio above and the CHD Risk Table to determine the patient's CHD Risk.        ATP III CLASSIFICATION (LDL):  <100     mg/dL   Optimal  100-129  mg/dL   Near or Above  Optimal  130-159  mg/dL   Borderline  160-189  mg/dL   High  >190     mg/dL   Very High   Lab Results  Component Value Date   TRIG 88 05/02/2010   TRIG 109 08/20/2009   TRIG 77 09/30/2008   Lab Results  Component Value Date   CHOLHDL 3.3 Ratio 05/02/2010   CHOLHDL 2.5 Ratio 08/20/2009   CHOLHDL 2.1 09/30/2008   No results found for: LDLDIRECT    Radiology   CXR 11/30/16 reveal vascular congestion  Echo:  Preserved EF, no sigificant valvular disease, no  evidence of hypertrophic CM, + diastolic dysfunction  ASSESSMENT AND PLAN:   1. Acute on chronic diastolic dysfunction I believe that this is related to her morbid obesity, recent meloxicam use, and diet.  Her echo is otherwise unremarkable.  There is no evidence of hypertrophic CM and ekg does not have LVH. I think that the most important long term intervention will be with aggressive lifestyle change. Continue IV lasix for now 1500 mg sodium diet Daily weights, strict Is and Os  2. Hypertensive cardiovascular and renal disease with CHF Likely contributing to her volume overload Stop meloxicam Weight loss advised Stop coreg (see below) IV lasix Given renal failure and diabetes, should be on an ace inhibitor Will start lisinopril 10mg  daily which can be uptitrated  3. Sinus bradycardia Asymptomatic Stop coreg  4. Morbid obesity Body mass index is 47.94 kg/m. Lifestyle modification encouraged  5. Acute on chronic renal insufficiency with stage III renal failure chronically Control BP Avoid NSAIDs Add Ace inhibitor   Thompson Grayer, MD 12/02/2016  2:17 PM

## 2016-12-03 DIAGNOSIS — R0602 Shortness of breath: Secondary | ICD-10-CM

## 2016-12-03 DIAGNOSIS — I1310 Hypertensive heart and chronic kidney disease without heart failure, with stage 1 through stage 4 chronic kidney disease, or unspecified chronic kidney disease: Secondary | ICD-10-CM

## 2016-12-03 DIAGNOSIS — E7801 Familial hypercholesterolemia: Secondary | ICD-10-CM

## 2016-12-03 LAB — GLUCOSE, CAPILLARY
Glucose-Capillary: 183 mg/dL — ABNORMAL HIGH (ref 65–99)
Glucose-Capillary: 166 mg/dL — ABNORMAL HIGH (ref 65–99)
Glucose-Capillary: 223 mg/dL — ABNORMAL HIGH (ref 65–99)
Glucose-Capillary: 231 mg/dL — ABNORMAL HIGH (ref 65–99)

## 2016-12-03 LAB — BASIC METABOLIC PANEL
Anion gap: 6 (ref 5–15)
BUN: 38 mg/dL — AB (ref 6–20)
CHLORIDE: 106 mmol/L (ref 101–111)
CO2: 25 mmol/L (ref 22–32)
CREATININE: 1.51 mg/dL — AB (ref 0.44–1.00)
Calcium: 8.6 mg/dL — ABNORMAL LOW (ref 8.9–10.3)
GFR calc Af Amer: 42 mL/min — ABNORMAL LOW (ref >60)
GFR calc non Af Amer: 36 mL/min — ABNORMAL LOW (ref >60)
GLUCOSE: 257 mg/dL — AB (ref 65–99)
Potassium: 4.6 mmol/L (ref 3.5–5.1)
SODIUM: 137 mmol/L (ref 135–145)

## 2016-12-03 LAB — CBC
HEMATOCRIT: 37.7 % (ref 36.0–46.0)
Hemoglobin: 11.7 g/dL — ABNORMAL LOW (ref 12.0–15.0)
MCH: 25.5 pg — AB (ref 26.0–34.0)
MCHC: 31 g/dL (ref 30.0–36.0)
MCV: 82.1 fL (ref 78.0–100.0)
PLATELETS: 88 10*3/uL — AB (ref 150–400)
RBC: 4.59 MIL/uL (ref 3.87–5.11)
RDW: 15.7 % — ABNORMAL HIGH (ref 11.5–15.5)
WBC: 5.7 10*3/uL (ref 4.0–10.5)

## 2016-12-03 LAB — HEMOGLOBIN A1C
Hgb A1c MFr Bld: 9.9 % — ABNORMAL HIGH (ref 4.8–5.6)
Mean Plasma Glucose: 237 mg/dL

## 2016-12-03 MED ORDER — INSULIN GLARGINE 100 UNIT/ML ~~LOC~~ SOLN
30.0000 [IU] | Freq: Every day | SUBCUTANEOUS | Status: DC
  Administered 2016-12-03 – 2016-12-04 (×2): 30 [IU] via SUBCUTANEOUS
  Filled 2016-12-03 (×2): qty 0.3

## 2016-12-03 MED ORDER — POTASSIUM CHLORIDE CRYS ER 20 MEQ PO TBCR
40.0000 meq | EXTENDED_RELEASE_TABLET | Freq: Every day | ORAL | Status: DC
  Administered 2016-12-03 – 2016-12-04 (×2): 40 meq via ORAL
  Filled 2016-12-03 (×2): qty 2

## 2016-12-03 MED ORDER — FUROSEMIDE 10 MG/ML IJ SOLN
60.0000 mg | Freq: Three times a day (TID) | INTRAMUSCULAR | Status: DC
  Administered 2016-12-03 – 2016-12-05 (×5): 60 mg via INTRAVENOUS
  Filled 2016-12-03 (×5): qty 6

## 2016-12-03 MED ORDER — INSULIN GLARGINE 100 UNIT/ML ~~LOC~~ SOLN
7.0000 [IU] | Freq: Once | SUBCUTANEOUS | Status: AC
  Administered 2016-12-03: 7 [IU] via SUBCUTANEOUS
  Filled 2016-12-03: qty 0.07

## 2016-12-03 NOTE — Progress Notes (Signed)
SUBJECTIVE: The patient is doing well today.  Breathing is a little better.  At this time, she denies chest pain or any new concerns.  . dorzolamide  1 drop Both Eyes BID  . doxazosin  4 mg Oral QHS  . enoxaparin (LOVENOX) injection  40 mg Subcutaneous Q24H  . furosemide  60 mg Intravenous Q8H  . insulin aspart  0-9 Units Subcutaneous TID WC  . insulin glargine  30 Units Subcutaneous QHS  . latanoprost  1 drop Both Eyes QHS  . lisinopril  10 mg Oral Daily  . mouth rinse  15 mL Mouth Rinse BID  . potassium chloride SA  40 mEq Oral Daily  . sodium chloride flush  3 mL Intravenous Q12H  . timolol  1 drop Both Eyes BID     OBJECTIVE: Physical Exam: Vitals:   12/03/16 0556 12/03/16 0559 12/03/16 0900 12/03/16 1210  BP: (!) 123/48  (!) 140/53 107/89  Pulse: (!) 49   95  Resp: 18  20 20   Temp: 97.4 F (36.3 C)  97.7 F (36.5 C) 98.4 F (36.9 C)  TempSrc: Oral  Oral Oral  SpO2: 100%  100% 100%  Weight:  251 lb 8 oz (114.1 kg)    Height:        Intake/Output Summary (Last 24 hours) at 12/03/16 1213 Last data filed at 12/03/16 1212  Gross per 24 hour  Intake              960 ml  Output             1950 ml  Net             -990 ml    Telemetry reveals sinus rhythm (personally reviewed)  GEN- The patient is overweight appearing, alert and oriented x 3 today.   Head- normocephalic, atraumatic Eyes-  Sclera clear, conjunctiva pink Ears- hearing intact Oropharynx- clear Neck- supple, + JVD Lungs- Clear to ausculation bilaterally, normal work of breathing Heart- Regular rate and rhythm, no murmurs, rubs or gallops, PMI not laterally displaced GI- soft, NT, ND, + BS Extremities- no clubbing, cyanosis, +1 dependant edema Skin- no rash or lesion Psych- euthymic mood, full affect Neuro- strength and sensation are intact  LABS: Basic Metabolic Panel:  Recent Labs  12/02/16 0436 12/03/16 0456  NA 136 137  K 4.6 4.6  CL 107 106  CO2 23 25  GLUCOSE 272* 257*  BUN  34* 38*  CREATININE 1.57* 1.51*  CALCIUM 8.6* 8.6*   Liver Function Tests:  Recent Labs  11/30/16 1826  AST 17  ALT 13*  ALKPHOS 72  BILITOT 0.3  PROT 7.0  ALBUMIN 3.3*   No results for input(s): LIPASE, AMYLASE in the last 72 hours. CBC:  Recent Labs  11/30/16 1826 12/01/16 0540 12/03/16 0456  WBC 7.5 7.0 5.7  NEUTROABS 4.5 4.2  --   HGB 11.5* 10.4* 11.7*  HCT 37.1 34.0* 37.7  MCV 82.4 82.9 82.1  PLT 140* 175 88*   Hemoglobin A1C:  Recent Labs  12/02/16 0436  HGBA1C 9.9*   Recent Labs  11/30/16 2202  TSH 3.247   ASSESSMENT AND PLAN:   1. Acute on chronic diastolic dysfunction I believe that this is related to her morbid obesity, recent meloxicam use, and diet.  Her echo is otherwise unremarkable.  There is no evidence of hypertrophic CM and ekg does not have LVH. I think that the most important long term intervention will be with aggressive lifestyle  change. 1500 mg sodium diet Daily weights, strict Is and Os Will likely require additional IV lasix for 2-3 more days  2. Hypertensive cardiovascular and renal disease with CHF Likely contributing to her volume overload Stop meloxicam Weight loss advised IV lasix Given renal failure and diabetes, should be on an ace inhibitor Will start lisinopril 10mg  daily which can be uptitrated  3. Sinus bradycardia Asymptomatic Improved off of coreg  4. Morbid obesity Body mass index is 47.52 kg/m. Lifestyle modification encouraged  5. Acute on chronic renal insufficiency with stage III renal failure chronically Control BP Avoid NSAIDs Add Ace inhibitor  Slow progress.  Complex medical situation.  A high level of decision making was required for this encounter.  General cardiology to follow with you    Thompson Grayer, MD 12/03/2016 12:13 PM

## 2016-12-03 NOTE — Progress Notes (Signed)
Inpatient Diabetes Program Recommendations  AACE/ADA: New Consensus Statement on Inpatient Glycemic Control (2015)  Target Ranges:  Prepandial:   less than 140 mg/dL      Peak postprandial:   less than 180 mg/dL (1-2 hours)      Critically ill patients:  140 - 180 mg/dL   Results for [name], [name] (MRN [id number]) as of 12/03/2016 10:10  Ref. Range 12/02/2016 07:29 12/02/2016 11:30 12/02/2016 16:19 12/02/2016 21:08 12/03/2016 07:52  Glucose-Capillary Latest Ref Range: 65 - 99 mg/dL 195 (H) 202 (H) 229 (H) 223 (H) 183 (H)    Review of Glycemic Control  Current orders for Inpatient glycemic control: Lantus 30 units QHS, Novolog 0-9 units TID with meals  Inpatient Diabetes Program Recommendations: Correction (SSI): Please consider ordering Novolog 0-5 units QHS for bedtime correction. Insulin - Meal Coverage: Please consider ordering Novolog 4 units TID with meals for meal coverage if patient eats at least 50% of meals.  Thanks, Barnie Alderman, RN, MSN, CDE Diabetes Coordinator Inpatient Diabetes Program [phone] (Team Pager from 8am to 5pm)

## 2016-12-03 NOTE — Progress Notes (Signed)
[facility] PROGRESS NOTE  [name] KZL:[id number] DOB: 20-Jan-1956 DOA: 11/30/2016   PCP: Elyn Peers, MD     Assessment/Plan: Principal Problem:   Diastolic CHF, acute on chronic (HCC) Active Problems:   Hyperlipidemia   Obesity   Iron deficiency anemia   OSA (obstructive sleep apnea)   Hypertension   CKD (chronic kidney disease) stage 3, GFR 30-59 ml/min   Thrombocytopenia (HCC)   Sinus bradycardia   SOB (shortness of breath)   61 y.o. female with medical history significant of a HTN, HLD, DM Type 2, asthma, obesity, OSA not on CPAP; who presents with complaints of progressively worsening shortness of breath over the last week.patient has also had a syncopal episode without any warning few days ago prior to admission. Chest x-ray showed cardiomegaly and pulmonary vascular congestion. Patient admitted for CHF exacerbation. Admitted and discharged 09/06/16 for the same  Assessment and plan Acute on chronic diastolic CHF exacerbation:   presents with worsening shortness of breath. Noted to be hypoxic with ambulation to 86% on room air. BNP 107, but patient is obese. Chest x-ray showing cardiomegaly with vascular congestion. Last echocardiogram was in 08/2016 showing EF of 60- 65% with signs of systolic anterior motion without signs of outflow obstruction but features suggestive of HOCM. As per cardiology, There is no evidence of hypertrophic CM and ekg does not have LVH. - Heart failure orders set  initiated  keep O2 saturations >92% - Strict I&Os and daily weights-down 259>253>251 pounds >2350cc , put in the last 24 hours Increase Lasix to 60 mg IV 3 times a day, creatinine stable around 1.5 TSH normal, d-dimer negative Repeat 2-D echo shows, EF of 39-03%, grade 2 diastolic dysfunction, Cardiology follow-up appreciated-aggressive lifestyle change.   Sinus bradycardia: Improving, heart rate in the 60s Patient reports that her Coreg had previously been  decreased from 25 mg twice a day to 6.25. Patient's last dose was this a.m.  Resume Coreg as heart rate allows   Diabetes mellitus type 2: I  . Patient on insulin and Jardiance. - Hypoglycemic protocols, Accu-Chek stable - Held Jardiance as there is recommendation for discontinuation if decreased GFR < 45.  Increase Lantus to 30  units subcutaneous qhs   Hemoglobin A1c pending  Anemia : Chronic. Initial hemoglobin 11.5. baseline hemoglobin appears to be around 10. - Continue to monitor.   Thrombocytopenia :acute.  Platelet count 140 on admission previously seen as low as 151 for in the past. - Continue to monitor  Essential hypertension - Continue Cardura    Obesity Body mass index is 47.52 kg/m.   Chronic kidney disease stage III: Stable. - Continue to monitor , baseline creatinine appears to be around 1.4  OSA - Follow-up with outpatient sleep study     DVT prophylaxsis Lovenox  Code Status:  Full code    Family Communication: Discussed in detail with the patient, all imaging results, lab results explained to the patient   Disposition Plan:  Likely 1-2 days     Consultants:  Cardiology  Procedures:  None  Antibiotics: Anti-infectives    None         HPI/Subjective: LE swelling and pain is getting better  Objective: Vitals:   12/02/16 2109 12/02/16 2249 12/03/16 0556 12/03/16 0559  BP: (!) 149/46 (!) 130/49 (!) 123/48   Pulse: 60 (!) 50 (!) 49   Resp: 18  18   Temp: 98.4 F (36.9 C)  97.4 F (36.3 C)  TempSrc: Oral  Oral   SpO2: 100% 100% 100%   Weight:    114.1 kg (251 lb 8 oz)  Height:        Intake/Output Summary (Last 24 hours) at 12/03/16 0910 Last data filed at 12/03/16 0556  Gross per 24 hour  Intake              720 ml  Output             1850 ml  Net            -1130 ml    Exam:  Examination:  General exam: Appears calm and comfortable  Respiratory system: Clear to auscultation. Respiratory effort  normal. Cardiovascular system: S1 & S2 heard, RRR. No JVD, murmurs, rubs, gallops or clicks.   Gastrointestinal system: Abdomen is nondistended, soft and nontender. No organomegaly or masses felt. Normal bowel sounds heard. Central nervous system: Alert and oriented. No focal neurological deficits. Extremities: Symmetric 5 x 5 power.3+ pitting edema Skin: No rashes, lesions or ulcers Psychiatry: Judgement and insight appear normal. Mood & affect appropriate.     Data Reviewed: I have personally reviewed following labs and imaging studies  Micro Results No results found for this or any previous visit (from the past 240 hour(s)).  Radiology Reports Dg Chest 2 View  Result Date: 11/30/2016 CLINICAL DATA:  Shortness of breath and edema in the legs bilaterally. EXAM: CHEST  2 VIEW COMPARISON:  09/04/2016 FINDINGS: The lungs are clear wiithout focal pneumonia, edema, pneumothorax or pleural effusion. There is pulmonary vascular congestion without overt pulmonary edema. The cardio pericardial silhouette is enlarged. The visualized bony structures of the thorax are intact. IMPRESSION: Cardiomegaly with vascular congestion. Electronically Signed   By: Misty Stanley M.D.   On: 11/30/2016 19:14     CBC  Recent Labs Lab 11/30/16 1826 12/01/16 0540 12/03/16 0456  WBC 7.5 7.0 5.7  HGB 11.5* 10.4* 11.7*  HCT 37.1 34.0* 37.7  PLT 140* 175 88*  MCV 82.4 82.9 82.1  MCH 25.6* 25.4* 25.5*  MCHC 31.0 30.6 31.0  RDW 15.9* 16.4* 15.7*  LYMPHSABS 2.0 1.9  --   MONOABS 0.5 0.6  --   EOSABS 0.3 0.3  --   BASOSABS 0.2* 0.0  --     Chemistries   Recent Labs Lab 11/30/16 1826 12/01/16 0540 12/02/16 0436 12/03/16 0456  NA 140 139 136 137  K 4.4 4.4 4.6 4.6  CL 110 110 107 106  CO2 24 24 23 25   GLUCOSE 111* 152* 272* 257*  BUN 26* 29* 34* 38*  CREATININE 1.44* 1.41* 1.57* 1.51*  CALCIUM 8.7* 8.5* 8.6* 8.6*  AST 17  --   --   --   ALT 13*  --   --   --   ALKPHOS 72  --   --   --    BILITOT 0.3  --   --   --    ------------------------------------------------------------------------------------------------------------------ estimated creatinine clearance is 46.5 mL/min (A) (by C-G formula based on SCr of 1.51 mg/dL (H)). ------------------------------------------------------------------------------------------------------------------ No results for input(s): HGBA1C in the last 72 hours. ------------------------------------------------------------------------------------------------------------------ No results for input(s): CHOL, HDL, LDLCALC, TRIG, CHOLHDL, LDLDIRECT in the last 72 hours. ------------------------------------------------------------------------------------------------------------------  Recent Labs  11/30/16 2202  TSH 3.247   ------------------------------------------------------------------------------------------------------------------ No results for input(s): VITAMINB12, FOLATE, FERRITIN, TIBC, IRON, RETICCTPCT in the last 72 hours.  Coagulation profile  Recent Labs Lab 11/30/16 1826  INR 0.96     Recent Labs  11/30/16 1826  DDIMER 0.45    Cardiac Enzymes No results for input(s): CKMB, TROPONINI, MYOGLOBIN in the last 168 hours.  Invalid input(s): CK ------------------------------------------------------------------------------------------------------------------ Invalid input(s): POCBNP   CBG:  Recent Labs Lab 12/02/16 0729 12/02/16 1130 12/02/16 1619 12/02/16 2108 12/03/16 0752  GLUCAP 195* 202* 229* 223* 183*       Studies: No results found.    Lab Results  Component Value Date   HGBA1C 13.5 01/22/2013   HGBA1C 10.2 08/20/2009   HGBA1C 10.5 07/02/2009   Lab Results  Component Value Date   MICROALBUR 2.45 (H) 05/03/2010   LDLCALC 120 (H) 05/02/2010   CREATININE 1.51 (H) 12/03/2016       Scheduled Meds: . dorzolamide  1 drop Both Eyes BID  . doxazosin  4 mg Oral QHS  . enoxaparin (LOVENOX)  injection  40 mg Subcutaneous Q24H  . furosemide  60 mg Intravenous Q8H  . insulin aspart  0-9 Units Subcutaneous TID WC  . insulin glargine  25 Units Subcutaneous QHS  . latanoprost  1 drop Both Eyes QHS  . lisinopril  10 mg Oral Daily  . mouth rinse  15 mL Mouth Rinse BID  . potassium chloride SA  20 mEq Oral QODAY  . sodium chloride flush  3 mL Intravenous Q12H  . timolol  1 drop Both Eyes BID   Continuous Infusions:   LOS: 3 days    Time spent: >30 MINS    [facility]  [facility] Pager [phone]. If 7PM-7AM, please contact night-coverage at [email or web address], password [facility] 12/03/2016, 9:10 AM  LOS: 3 days

## 2016-12-03 NOTE — Progress Notes (Signed)
Pt educated about safety and importance of bed alarm during the night however pt refuses to be on bed alarm. Will continue to round on patient.   Tarin Navarez, RN    

## 2016-12-03 NOTE — Progress Notes (Signed)
Patient slept mostly during the night. Complained of back pain. PRN given and effective. Will continue to monitor.   Vincen Bejar, RN

## 2016-12-04 DIAGNOSIS — E7800 Pure hypercholesterolemia, unspecified: Secondary | ICD-10-CM

## 2016-12-04 LAB — COMPREHENSIVE METABOLIC PANEL
ALBUMIN: 3.1 g/dL — AB (ref 3.5–5.0)
ALK PHOS: 64 U/L (ref 38–126)
ALT: 10 U/L — AB (ref 14–54)
AST: 12 U/L — AB (ref 15–41)
Anion gap: 7 (ref 5–15)
BUN: 46 mg/dL — AB (ref 6–20)
CALCIUM: 8.9 mg/dL (ref 8.9–10.3)
CHLORIDE: 101 mmol/L (ref 101–111)
CO2: 27 mmol/L (ref 22–32)
CREATININE: 1.63 mg/dL — AB (ref 0.44–1.00)
GFR calc non Af Amer: 33 mL/min — ABNORMAL LOW (ref >60)
GFR, EST AFRICAN AMERICAN: 39 mL/min — AB (ref >60)
GLUCOSE: 223 mg/dL — AB (ref 65–99)
Potassium: 4.8 mmol/L (ref 3.5–5.1)
SODIUM: 135 mmol/L (ref 135–145)
Total Bilirubin: 0.4 mg/dL (ref 0.3–1.2)
Total Protein: 6.9 g/dL (ref 6.5–8.1)

## 2016-12-04 LAB — GLUCOSE, CAPILLARY
GLUCOSE-CAPILLARY: 183 mg/dL — AB (ref 65–99)
Glucose-Capillary: 169 mg/dL — ABNORMAL HIGH (ref 65–99)
Glucose-Capillary: 208 mg/dL — ABNORMAL HIGH (ref 65–99)
Glucose-Capillary: 192 mg/dL — ABNORMAL HIGH (ref 65–99)
Glucose-Capillary: 194 mg/dL — ABNORMAL HIGH (ref 65–99)

## 2016-12-04 MED ORDER — METOLAZONE 2.5 MG PO TABS
2.5000 mg | ORAL_TABLET | Freq: Once | ORAL | Status: AC
  Administered 2016-12-04: 2.5 mg via ORAL
  Filled 2016-12-04: qty 1

## 2016-12-04 MED ORDER — LIVING WELL WITH DIABETES BOOK
Freq: Once | Status: AC
  Administered 2016-12-04: 16:00:00
  Filled 2016-12-04: qty 1

## 2016-12-04 MED ORDER — TRAZODONE HCL 50 MG PO TABS
50.0000 mg | ORAL_TABLET | Freq: Every evening | ORAL | Status: DC | PRN
  Administered 2016-12-04 – 2016-12-05 (×2): 50 mg via ORAL
  Filled 2016-12-04 (×2): qty 1

## 2016-12-04 MED ORDER — INSULIN ASPART 100 UNIT/ML ~~LOC~~ SOLN
4.0000 [IU] | Freq: Three times a day (TID) | SUBCUTANEOUS | Status: DC
  Administered 2016-12-04 – 2016-12-05 (×4): 4 [IU] via SUBCUTANEOUS

## 2016-12-04 NOTE — Progress Notes (Signed)
[facility] PROGRESS NOTE  [name] PJA:[id number] DOB: [birth year] DOA: 11/30/2016   PCP: Elyn Peers, MD     Assessment/Plan: Principal Problem:   Diastolic CHF, acute on chronic (HCC) Active Problems:   Hyperlipidemia   Morbid obesity (HCC)   Iron deficiency anemia   OSA (obstructive sleep apnea)   Hypertension   CKD (chronic kidney disease) stage 3, GFR 30-59 ml/min   Thrombocytopenia (HCC)   Sinus bradycardia   SOB (shortness of breath)   Hypertensive heart and renal disease   61 y.o. female with medical history significant of a HTN, HLD, DM Type 2, asthma, obesity, OSA not on CPAP; who presents with complaints of progressively worsening shortness of breath over the last week.patient has also had a syncopal episode without any warning few days ago prior to admission. Chest x-ray showed cardiomegaly and pulmonary vascular congestion. Patient admitted for CHF exacerbation. Admitted and discharged 09/06/16 for the same  Assessment and plan Acute on chronic diastolic CHF exacerbation:   presents with worsening shortness of breath. Noted to be hypoxic with ambulation to 86% on room air. BNP 107, but patient is obese. Chest x-ray showing cardiomegaly with vascular congestion. Last echocardiogram was in 08/2016 showing EF of 60- 65% with signs of systolic anterior motion without signs of outflow obstruction but features suggestive of HOCM. As per cardiology, There is no evidence of hypertrophic CM and ekg does not have LVH this admission. - Strict I&Os and daily weights-down 259>253>251>246 pounds >2950cc , put in the last 24 hours Continue Lasix to 60 mg IV 3 times a day, creatinine stable around 1.5-1.6. Add Zaroxolyn 2.5 mg 1 TSH normal, d-dimer negative Repeat 2-D echo shows, EF of 34-19%, grade 2 diastolic dysfunction, Cardiology follow-up appreciated-aggressive lifestyle change. Hold lisinopril as the patient will continue diuresis with Lasix, in the setting  of compromised renal function Consider starting lisinopril prior to discharge  Sinus bradycardia: Improving, heart rate in the 60s Patient reports that her Coreg had previously been decreased from 25 mg twice a day to 6.25. Patient's last dose was this a.m.  Resume Coreg as heart rate allows   Diabetes mellitus type 2: I  . Patient on insulin and Jardiance. - Hypoglycemic protocols, Accu-Chek stable - Held Jardiance as there is recommendation for discontinuation if decreased GFR < 45.  Continue Lantus to 30  units subcutaneous qhs  ,add Novolog 4 units TID with meals  Hemoglobin A1c  9.9 ACE inhibitor prior to discharge  Anemia : Chronic. Initial hemoglobin 11.5. baseline hemoglobin appears to be around 10. - Continue to monitor.   Thrombocytopenia :acute.  Platelet count 140 on admission previously seen as low as 151 for in the past. Platelets of Dr. 88, will DC Lovenox  Essential hypertension - Continue Cardura    Obesity Body mass index is 46.59 kg/m.   Chronic kidney disease stage III: Stable. - Continue to monitor , baseline creatinine appears to be around 1.4  OSA - Follow-up with outpatient sleep study     DVT prophylaxsis Lovenox> SCDs  Code Status:  Full code    Family Communication: Discussed in detail with the patient, all imaging results, lab results explained to the patient   Disposition Plan:  Continue diuresis with IV Lasix     Consultants:  Cardiology  Procedures:  None  Antibiotics: Anti-infectives    None         HPI/Subjective: Did not sleep well, has a headache  Objective: Vitals:  12/03/16 0900 12/03/16 1210 12/03/16 2023 12/04/16 0517  BP: (!) 140/53 107/89 (!) 152/50 (!) 121/49  Pulse:  95 (!) 50 (!) 46  Resp: 20 20 20 20   Temp: 97.7 F (36.5 C) 98.4 F (36.9 C) 98.1 F (36.7 C) 97.7 F (36.5 C)  TempSrc: Oral Oral Oral Oral  SpO2: 100% 100% 100% 99%  Weight:    111.9 kg (246 lb 9.6 oz)  Height:         Intake/Output Summary (Last 24 hours) at 12/04/16 0813 Last data filed at 12/04/16 0400  Gross per 24 hour  Intake              640 ml  Output             2950 ml  Net            -2310 ml    Exam:  Examination:  General exam: Appears calm and comfortable  Respiratory system: Clear to auscultation. Respiratory effort normal. Cardiovascular system: S1 & S2 heard, RRR. No JVD, murmurs, rubs, gallops or clicks.   Gastrointestinal system: Abdomen is nondistended, soft and nontender. No organomegaly or masses felt. Normal bowel sounds heard. Central nervous system: Alert and oriented. No focal neurological deficits. Extremities: Symmetric 5 x 5 power.3+ pitting edema Skin: No rashes, lesions or ulcers Psychiatry: Judgement and insight appear normal. Mood & affect appropriate.     Data Reviewed: I have personally reviewed following labs and imaging studies  Micro Results No results found for this or any previous visit (from the past 240 hour(s)).  Radiology Reports Dg Chest 2 View  Result Date: 11/30/2016 CLINICAL DATA:  Shortness of breath and edema in the legs bilaterally. EXAM: CHEST  2 VIEW COMPARISON:  09/04/2016 FINDINGS: The lungs are clear wiithout focal pneumonia, edema, pneumothorax or pleural effusion. There is pulmonary vascular congestion without overt pulmonary edema. The cardio pericardial silhouette is enlarged. The visualized bony structures of the thorax are intact. IMPRESSION: Cardiomegaly with vascular congestion. Electronically Signed   By: Misty Stanley M.D.   On: 11/30/2016 19:14     CBC  Recent Labs Lab 11/30/16 1826 12/01/16 0540 12/03/16 0456  WBC 7.5 7.0 5.7  HGB 11.5* 10.4* 11.7*  HCT 37.1 34.0* 37.7  PLT 140* 175 88*  MCV 82.4 82.9 82.1  MCH 25.6* 25.4* 25.5*  MCHC 31.0 30.6 31.0  RDW 15.9* 16.4* 15.7*  LYMPHSABS 2.0 1.9  --   MONOABS 0.5 0.6  --   EOSABS 0.3 0.3  --   BASOSABS 0.2* 0.0  --     Chemistries   Recent Labs Lab  11/30/16 1826 12/01/16 0540 12/02/16 0436 12/03/16 0456 12/04/16 0523  NA 140 139 136 137 135  K 4.4 4.4 4.6 4.6 4.8  CL 110 110 107 106 101  CO2 24 24 23 25 27   GLUCOSE 111* 152* 272* 257* 223*  BUN 26* 29* 34* 38* 46*  CREATININE 1.44* 1.41* 1.57* 1.51* 1.63*  CALCIUM 8.7* 8.5* 8.6* 8.6* 8.9  AST 17  --   --   --  12*  ALT 13*  --   --   --  10*  ALKPHOS 72  --   --   --  64  BILITOT 0.3  --   --   --  0.4   ------------------------------------------------------------------------------------------------------------------ estimated creatinine clearance is 42.5 mL/min (A) (by C-G formula based on SCr of 1.63 mg/dL (H)). ------------------------------------------------------------------------------------------------------------------  Recent Labs  12/02/16 0436  HGBA1C 9.9*   ------------------------------------------------------------------------------------------------------------------  No results for input(s): CHOL, HDL, LDLCALC, TRIG, CHOLHDL, LDLDIRECT in the last 72 hours. ------------------------------------------------------------------------------------------------------------------ No results for input(s): TSH, T4TOTAL, T3FREE, THYROIDAB in the last 72 hours.  Invalid input(s): FREET3 ------------------------------------------------------------------------------------------------------------------ No results for input(s): VITAMINB12, FOLATE, FERRITIN, TIBC, IRON, RETICCTPCT in the last 72 hours.  Coagulation profile  Recent Labs Lab 11/30/16 1826  INR 0.96    No results for input(s): DDIMER in the last 72 hours.  Cardiac Enzymes No results for input(s): CKMB, TROPONINI, MYOGLOBIN in the last 168 hours.  Invalid input(s): CK ------------------------------------------------------------------------------------------------------------------ Invalid input(s): POCBNP   CBG:  Recent Labs Lab 12/03/16 0752 12/03/16 1121 12/03/16 1638 12/03/16 2108  12/04/16 0752  GLUCAP 183* 166* 223* 231* 169*       Studies: No results found.    Lab Results  Component Value Date   HGBA1C 9.9 (H) 12/02/2016   HGBA1C 13.5 01/22/2013   HGBA1C 10.2 08/20/2009   Lab Results  Component Value Date   MICROALBUR 2.45 (H) 05/03/2010   LDLCALC 120 (H) 05/02/2010   CREATININE 1.63 (H) 12/04/2016       Scheduled Meds: . dorzolamide  1 drop Both Eyes BID  . doxazosin  4 mg Oral QHS  . enoxaparin (LOVENOX) injection  40 mg Subcutaneous Q24H  . furosemide  60 mg Intravenous Q8H  . insulin aspart  0-9 Units Subcutaneous TID WC  . insulin glargine  30 Units Subcutaneous QHS  . latanoprost  1 drop Both Eyes QHS  . mouth rinse  15 mL Mouth Rinse BID  . metolazone  2.5 mg Oral Once  . potassium chloride SA  40 mEq Oral Daily  . sodium chloride flush  3 mL Intravenous Q12H  . timolol  1 drop Both Eyes BID   Continuous Infusions:   LOS: 4 days    Time spent: >30 MINS    [facility]  [facility] Pager [phone]. If 7PM-7AM, please contact night-coverage at [email or web address], password [facility] 12/04/2016, 8:13 AM  LOS: 4 days

## 2016-12-04 NOTE — Progress Notes (Signed)
BS 194/mg/dl pt requesting for glucose checking States may be my sugar is down.  Dr Allyson Sabal notified of pt c/o light headedness and also cramping in hands and legs and has subsided.  Also pt's BP 114/44.  MD instructed to hold lasix due tonight at 2200.  Pt made aware.  No further complaint voiced.  Will continue to monitor.  Karie Kirks, RN

## 2016-12-04 NOTE — Progress Notes (Signed)
Gave pt living well with diabetes book.  Ordered by diabetic nurse.  Karie Kirks, Therapist, sports.

## 2016-12-04 NOTE — Progress Notes (Signed)
Pt up in chair c/o feeling light headed.  BP 114/44 assisted back to bed with walker.  Pt continue with unsteady gait.  Put bed alarm on and instructed her to call for assistance.  Also staid leg and hands are cramping when she went back to bed  And said "It stopped."    Will continue to monitor.  Karie Kirks, Therapist, sports.

## 2016-12-04 NOTE — Progress Notes (Signed)
Progress Note  Patient Name: [name] Date of Encounter: 12/04/2016  Primary Cardiologist: Dr. Johnsie Cancel  Subjective   Patient is feeling well; denies chest pain and palpitations. She is still short of breath, but this is improving. Wearing supplemental O2.   Inpatient Medications    Scheduled Meds: . dorzolamide  1 drop Both Eyes BID  . doxazosin  4 mg Oral QHS  . furosemide  60 mg Intravenous Q8H  . insulin aspart  0-9 Units Subcutaneous TID WC  . insulin glargine  30 Units Subcutaneous QHS  . latanoprost  1 drop Both Eyes QHS  . mouth rinse  15 mL Mouth Rinse BID  . potassium chloride SA  40 mEq Oral Daily  . sodium chloride flush  3 mL Intravenous Q12H  . timolol  1 drop Both Eyes BID   Continuous Infusions:  PRN Meds: sodium chloride, acetaminophen, ipratropium-albuterol, ondansetron (ZOFRAN) IV, sodium chloride flush   Vital Signs    Vitals:   12/03/16 0900 12/03/16 1210 12/03/16 2023 12/04/16 0517  BP: (!) 140/53 107/89 (!) 152/50 (!) 121/49  Pulse:  95 (!) 50 (!) 46  Resp: 20 20 20 20   Temp: 97.7 F (36.5 C) 98.4 F (36.9 C) 98.1 F (36.7 C) 97.7 F (36.5 C)  TempSrc: Oral Oral Oral Oral  SpO2: 100% 100% 100% 99%  Weight:    246 lb 9.6 oz (111.9 kg)  Height:        Intake/Output Summary (Last 24 hours) at 12/04/16 0913 Last data filed at 12/04/16 0086  Gross per 24 hour  Intake              760 ml  Output             3250 ml  Net            -2490 ml   Filed Weights   12/02/16 0509 12/03/16 0559 12/04/16 0517  Weight: 253 lb 11.2 oz (115.1 kg) 251 lb 8 oz (114.1 kg) 246 lb 9.6 oz (111.9 kg)     Physical Exam   General: Well developed, well nourished, female appearing in no acute distress. Head: Normocephalic, atraumatic.  Neck: Supple without bruits, JVD Lungs:  Resp regular and unlabored, CTA. Heart: RRR, S1, S2, no murmur; no rub. Abdomen: Soft, non-tender, non-distended with normoactive bowel sounds. No hepatomegaly. No  rebound/guarding. No obvious abdominal masses. Extremities: No clubbing, cyanosis, 1+ edema. Distal pedal pulses are 1+ bilaterally. Neuro: Alert and oriented X 3. Moves all extremities spontaneously. Psych: Normal affect.  Labs    Chemistry Recent Labs Lab 11/30/16 1826  12/02/16 0436 12/03/16 0456 12/04/16 0523  NA 140  < > 136 137 135  K 4.4  < > 4.6 4.6 4.8  CL 110  < > 107 106 101  CO2 24  < > 23 25 27   GLUCOSE 111*  < > 272* 257* 223*  BUN 26*  < > 34* 38* 46*  CREATININE 1.44*  < > 1.57* 1.51* 1.63*  CALCIUM 8.7*  < > 8.6* 8.6* 8.9  PROT 7.0  --   --   --  6.9  ALBUMIN 3.3*  --   --   --  3.1*  AST 17  --   --   --  12*  ALT 13*  --   --   --  10*  ALKPHOS 72  --   --   --  64  BILITOT 0.3  --   --   --  0.4  GFRNONAA 39*  < > 35* 36* 33*  GFRAA 45*  < > 40* 42* 39*  ANIONGAP 6  < > 6 6 7   < > = values in this interval not displayed.   Hematology Recent Labs Lab 11/30/16 1826 12/01/16 0540 12/03/16 0456  WBC 7.5 7.0 5.7  RBC 4.50 4.10 4.59  HGB 11.5* 10.4* 11.7*  HCT 37.1 34.0* 37.7  MCV 82.4 82.9 82.1  MCH 25.6* 25.4* 25.5*  MCHC 31.0 30.6 31.0  RDW 15.9* 16.4* 15.7*  PLT 140* 175 88*   Recent Labs Lab 11/30/16 1833 11/30/16 2104  TROPIPOC 0.00 0.01     BNP Recent Labs Lab 11/30/16 1815  BNP 107.0*     DDimer  Recent Labs Lab 11/30/16 1826  DDIMER 0.45     Radiology    No results found.   Telemetry    Sinus brady 40-50s - Personally Reviewed  ECG    No new tracings - Personally Reviewed   Cardiac Studies   Echocardiogram 12/02/16: Study Conclusions  - Left ventricle: The cavity size was normal. There was mild   concentric hypertrophy. Systolic function was normal. The   estimated ejection fraction was in the range of 60% to 65%. Wall   motion was normal; there were no regional wall motion   abnormalities. Features are consistent with a pseudonormal left   ventricular filling pattern, with concomitant abnormal  relaxation   and increased filling pressure (grade 2 diastolic dysfunction).   Doppler parameters are consistent with high ventricular filling   pressure. - Mitral valve: There was trivial regurgitation. - Pulmonic valve: There was trivial regurgitation.    Patient Profile     61 y.o. female with a h/o morbid obesity (Body mass index is 47.94 kg/m.) and multiple secondary illnesses related to morbid obesity including sleep apnea, DJD/DDD, DM, HTN, and volume overload.  She is now admitted with worsening volume overload.  She reports that recently, she has been taking meloxicam.  She thinks that this could have contributed to her symptoms.  She reports that she is "trying to eat better".  Positive edema and abdominal distension. Cardiology was consulted  Assessment & Plan    1. Acute on chronic diastolic dysfunction I believe that this is related to her morbid obesity, recent meloxicam use, and diet.  Her echo is otherwise unremarkable.  There is no evidence of hypertrophic CM and ekg does not have LVH. I think that the most important long term intervention will be with aggressive lifestyle change. 1500 mg sodium diet Daily weights, strict Is and Os Will likely require additional IV lasix for 2-3 more days - diuresing well: 246 lbs (weight on admission 259 lbs); overall net negative 7L with 2.9L urine output yesterday - continue diuresing and wean O2 - discussed again the importance of diet and low sodium   2. Hypertensive cardiovascular and renal disease with CHF Likely contributing to her volume overload Stop meloxicam Weight loss advised IV lasix Given renal failure and diabetes, should be on an ace inhibitor Lisinopril 10mg  daily added to her regimen - sBP 150-120s; may increase lisinopril for better control   3. Sinus bradycardia Asymptomatic D/C'ed coreg - SB in the 40-50s   4. Morbid obesity Body mass index is 47.52 kg/m. Lifestyle modification  encouraged   5. Acute on chronic renal insufficiency with stage III renal failure chronically Control BP Avoid NSAIDs Add Ace inhibitor   Signed, Ledora Bottcher , PA-C 9:13 AM 12/04/2016 Pager:  [phone] As above, patient seen and examined. Patient's dyspnea is improving and she states her edema has improved as well. No chest pain. We will continue with present dose of Lasix for now. Hopefully can transition to oral Lasix tomorrow. Follow renal function closely. Carvedilol discontinued because of sinus bradycardia. Follow blood pressure and add medications as needed. Would avoid ACE inhibitor at this point given worsening renal insufficiency. This can be considered later once renal function stable and CHF improves. If additional blood pressure medication necessary would add amlodipine.  Kirk Ruths, MD

## 2016-12-04 NOTE — Progress Notes (Signed)
Instructed by diabetic educator to order living well with diabetes book.   [name] [name]

## 2016-12-04 NOTE — Progress Notes (Addendum)
Inpatient Diabetes Program Recommendations  AACE/ADA: New Consensus Statement on Inpatient Glycemic Control (2015)  Target Ranges:  Prepandial:   less than 140 mg/dL      Peak postprandial:   less than 180 mg/dL (1-2 hours)      Critically ill patients:  140 - 180 mg/dL   Lab Results  Component Value Date   GLUCAP 169 (H) 12/04/2016   HGBA1C 9.9 (H) 12/02/2016    Review of Glycemic Control Home DM medications: Lantus 40 U every HS, jardiance Current orders for Inpatient glycemic control: Lantus 30 units QHS, Novolog 0-9 units TID with meals  Inpatient Diabetes Program Recommendations: Correction (SSI): Please consider ordering Novolog 0-5 units QHS for bedtime correction. Insulin - Meal Coverage: Please consider ordering Novolog 4 units TID with meals for meal coverage if patient eats at least 50% of meals.  3:40 Spoke with pt about A1C 9.9 (average BG 237) and explained what an A1C is, basic pathophysiology of DM Type 2, basic home care, basic diabetes diet nutrition principles, importance of checking CBGs and maintaining good CBG control to prevent long-term and short-term complications. Reviewed signs and symptoms of hyperglycemia and hypoglycemia and how to treat hypoglycemia at home. Also reviewed blood sugar goals at home.  RNs to provide ongoing basic DM education at bedside with this patient. Have ordered educational booklet and DM videos.   Thank you, Nani Gasser. Charvis Lightner, RN, MSN, CDE Inpatient Glycemic Control Team Team Pager [phone] (8am-5pm) 12/04/2016 11:08 AM

## 2016-12-05 LAB — BASIC METABOLIC PANEL
ANION GAP: 11 (ref 5–15)
BUN: 57 mg/dL — ABNORMAL HIGH (ref 6–20)
CO2: 26 mmol/L (ref 22–32)
Calcium: 9 mg/dL (ref 8.9–10.3)
Chloride: 97 mmol/L — ABNORMAL LOW (ref 101–111)
Creatinine, Ser: 1.89 mg/dL — ABNORMAL HIGH (ref 0.44–1.00)
GFR, EST AFRICAN AMERICAN: 32 mL/min — AB (ref >60)
GFR, EST NON AFRICAN AMERICAN: 28 mL/min — AB (ref >60)
Glucose, Bld: 231 mg/dL — ABNORMAL HIGH (ref 65–99)
POTASSIUM: 4.9 mmol/L (ref 3.5–5.1)
SODIUM: 134 mmol/L — AB (ref 135–145)

## 2016-12-05 LAB — CBC
HEMATOCRIT: 37 % (ref 36.0–46.0)
HEMOGLOBIN: 11.6 g/dL — AB (ref 12.0–15.0)
MCH: 25.5 pg — ABNORMAL LOW (ref 26.0–34.0)
MCHC: 31.4 g/dL (ref 30.0–36.0)
MCV: 81.3 fL (ref 78.0–100.0)
Platelets: 182 10*3/uL (ref 150–400)
RBC: 4.55 MIL/uL (ref 3.87–5.11)
RDW: 15.3 % (ref 11.5–15.5)
WBC: 7.3 10*3/uL (ref 4.0–10.5)

## 2016-12-05 LAB — GLUCOSE, CAPILLARY
GLUCOSE-CAPILLARY: 223 mg/dL — AB (ref 65–99)
GLUCOSE-CAPILLARY: 218 mg/dL — AB (ref 65–99)
Glucose-Capillary: 212 mg/dL — ABNORMAL HIGH (ref 65–99)
Glucose-Capillary: 270 mg/dL — ABNORMAL HIGH (ref 65–99)

## 2016-12-05 MED ORDER — INSULIN ASPART 100 UNIT/ML ~~LOC~~ SOLN
4.0000 [IU] | Freq: Three times a day (TID) | SUBCUTANEOUS | Status: DC
  Administered 2016-12-06 – 2016-12-07 (×3): 4 [IU] via SUBCUTANEOUS

## 2016-12-05 MED ORDER — SODIUM CHLORIDE 0.9 % IV BOLUS (SEPSIS): 250.0000 mL | Freq: Once | INTRAVENOUS | Status: DC

## 2016-12-05 MED ORDER — INSULIN GLARGINE 100 UNIT/ML ~~LOC~~ SOLN
35.0000 [IU] | Freq: Every day | SUBCUTANEOUS | Status: DC
  Administered 2016-12-05: 35 [IU] via SUBCUTANEOUS
  Filled 2016-12-05: qty 0.35

## 2016-12-05 NOTE — Progress Notes (Addendum)
[facility] PROGRESS NOTE  [name] YWV:[id number] DOB: April 12, 1956 DOA: 11/30/2016   PCP: Elyn Peers, MD     Assessment/Plan: Principal Problem:   Diastolic CHF, acute on chronic (HCC) Active Problems:   Hyperlipidemia   Morbid obesity (HCC)   Iron deficiency anemia   OSA (obstructive sleep apnea)   Hypertension   CKD (chronic kidney disease) stage 3, GFR 30-59 ml/min   Thrombocytopenia (HCC)   Sinus bradycardia   SOB (shortness of breath)   Hypertensive heart and renal disease   61 y.o. female with medical history significant of a HTN, HLD, DM Type 2, asthma, obesity, OSA not on CPAP; who presents with complaints of progressively worsening shortness of breath over the last week.patient has also had a syncopal episode without any warning few days ago prior to admission. Chest x-ray showed cardiomegaly and pulmonary vascular congestion. Patient admitted for CHF exacerbation. Admitted and discharged 09/06/16 for the same  Assessment and plan Acute on chronic diastolic CHF exacerbation:   presents with worsening shortness of breath. Noted to be hypoxic with ambulation to 86% on room air. BNP 107, but patient is obese. Chest x-ray showing cardiomegaly with vascular congestion. Last echocardiogram was in 08/2016 showing EF of 60- 65% with signs of systolic anterior motion without signs of outflow obstruction but features suggestive of HOCM. As per cardiology, There is no evidence of hypertrophic CM and ekg does not have LVH this admission. - Strict I&Os and daily weights-down 259>253>251>246 >245 pounds >3100 cc , put in the last 24 hours Held Lasix to 60 mg IV 3 times a day, held  further diuresis today, due to increasing creatinine, lightheadedness and cramping reported by the patient. Hold diuretics today and reassess renal function in the morning TSH normal, d-dimer negative Repeat 2-D echo shows, EF of 85-46%, grade 2 diastolic dysfunction, Cardiology follow-up  appreciated- recommend aggressive lifestyle change. Hold lisinopril as the patient will continue diuresis with Lasix, in the setting of compromised renal function Consider starting lisinopril prior to discharge right heart catheterization tomorrow to better assess pulmonary pressures and pulmonary capillary wedge pressure   Sinus bradycardia: Improving, heart rate in the 60s  Patient previously on Coreg Currently on hold secondary to bradycardia   Diabetes mellitus type 2  . Patient on insulin and Jardiance. - Hypoglycemic protocols, Accu-Chek stable - Held Jardiance as there is recommendation for discontinuation if decreased GFR < 45.  Increase Lantus to 35  units subcutaneous qhs  ,added Novolog 4 units TID with meals  Hemoglobin A1c  9.9 ACE inhibitor prior to discharge   Anemia : Chronic. Initial hemoglobin 11.5. baseline hemoglobin appears to be around 10.  Remained stable  Thrombocytopenia :acute.  Platelet count 140 on admission previously seen as low as 151 for in the past. Platelets dropped to 88, discontinued Lovenox. Platelet count has now improved after discontinuation of Lovenox  Essential hypertension - Continue Cardura    Obesity Body mass index is 46.31 kg/m.   Chronic kidney disease stage III: Stable. Slight increase in creatinine to 1.89 today, hold diuresis, recheck tomorrow  OSA - Follow-up with outpatient sleep study     DVT prophylaxsis Lovenox> SCDs  Code Status:  Full code    Family Communication: Discussed in detail with the patient, all imaging results, lab results explained to the patient   Disposition Plan:  Continue diuresis with IV Lasix     Consultants:  Cardiology  Procedures:  None  Antibiotics: Anti-infectives  None         HPI/Subjective: Did not sleep well, has a headache  Objective: Vitals:   12/04/16 2113 12/05/16 0500 12/05/16 0626 12/05/16 0706  BP: 137/67 127/60 (!) 111/38   Pulse: (!) 56   (!) 58   Resp: 18  18   Temp: 98.3 F (36.8 C)  98.2 F (36.8 C)   TempSrc: Oral  Oral   SpO2: 95%  98%   Weight:    111.2 kg (245 lb 1.6 oz)  Height:        Intake/Output Summary (Last 24 hours) at 12/05/16 1001 Last data filed at 12/05/16 0949  Gross per 24 hour  Intake              600 ml  Output             3300 ml  Net            -2700 ml    Exam:  Examination:  General exam: Appears calm and comfortable  Respiratory system: Clear to auscultation. Respiratory effort normal. Cardiovascular system: S1 & S2 heard, RRR. No JVD, murmurs, rubs, gallops or clicks.   Gastrointestinal system: Abdomen is nondistended, soft and nontender. No organomegaly or masses felt. Normal bowel sounds heard. Central nervous system: Alert and oriented. No focal neurological deficits. Extremities: Symmetric 5 x 5 power.3+ pitting edema Skin: No rashes, lesions or ulcers Psychiatry: Judgement and insight appear normal. Mood & affect appropriate.     Data Reviewed: I have personally reviewed following labs and imaging studies  Micro Results No results found for this or any previous visit (from the past 240 hour(s)).  Radiology Reports Dg Chest 2 View  Result Date: 11/30/2016 CLINICAL DATA:  Shortness of breath and edema in the legs bilaterally. EXAM: CHEST  2 VIEW COMPARISON:  09/04/2016 FINDINGS: The lungs are clear wiithout focal pneumonia, edema, pneumothorax or pleural effusion. There is pulmonary vascular congestion without overt pulmonary edema. The cardio pericardial silhouette is enlarged. The visualized bony structures of the thorax are intact. IMPRESSION: Cardiomegaly with vascular congestion. Electronically Signed   By: Misty Stanley M.D.   On: 11/30/2016 19:14     CBC  Recent Labs Lab 11/30/16 1826 12/01/16 0540 12/03/16 0456 12/05/16 0416  WBC 7.5 7.0 5.7 7.3  HGB 11.5* 10.4* 11.7* 11.6*  HCT 37.1 34.0* 37.7 37.0  PLT 140* 175 88* 182  MCV 82.4 82.9 82.1 81.3  MCH  25.6* 25.4* 25.5* 25.5*  MCHC 31.0 30.6 31.0 31.4  RDW 15.9* 16.4* 15.7* 15.3  LYMPHSABS 2.0 1.9  --   --   MONOABS 0.5 0.6  --   --   EOSABS 0.3 0.3  --   --   BASOSABS 0.2* 0.0  --   --     Chemistries   Recent Labs Lab 11/30/16 1826 12/01/16 0540 12/02/16 0436 12/03/16 0456 12/04/16 0523 12/05/16 0416  NA 140 139 136 137 135 134*  K 4.4 4.4 4.6 4.6 4.8 4.9  CL 110 110 107 106 101 97*  CO2 24 24 23 25 27 26   GLUCOSE 111* 152* 272* 257* 223* 231*  BUN 26* 29* 34* 38* 46* 57*  CREATININE 1.44* 1.41* 1.57* 1.51* 1.63* 1.89*  CALCIUM 8.7* 8.5* 8.6* 8.6* 8.9 9.0  AST 17  --   --   --  12*  --   ALT 13*  --   --   --  10*  --   ALKPHOS 72  --   --   --  64  --   BILITOT 0.3  --   --   --  0.4  --    ------------------------------------------------------------------------------------------------------------------ estimated creatinine clearance is 36.6 mL/min (A) (by C-G formula based on SCr of 1.89 mg/dL (H)). ------------------------------------------------------------------------------------------------------------------ No results for input(s): HGBA1C in the last 72 hours. ------------------------------------------------------------------------------------------------------------------ No results for input(s): CHOL, HDL, LDLCALC, TRIG, CHOLHDL, LDLDIRECT in the last 72 hours. ------------------------------------------------------------------------------------------------------------------ No results for input(s): TSH, T4TOTAL, T3FREE, THYROIDAB in the last 72 hours.  Invalid input(s): FREET3 ------------------------------------------------------------------------------------------------------------------ No results for input(s): VITAMINB12, FOLATE, FERRITIN, TIBC, IRON, RETICCTPCT in the last 72 hours.  Coagulation profile  Recent Labs Lab 11/30/16 1826  INR 0.96    No results for input(s): DDIMER in the last 72 hours.  Cardiac Enzymes No results for input(s):  CKMB, TROPONINI, MYOGLOBIN in the last 168 hours.  Invalid input(s): CK ------------------------------------------------------------------------------------------------------------------ Invalid input(s): POCBNP   CBG:  Recent Labs Lab 12/04/16 1226 12/04/16 1716 12/04/16 1833 12/04/16 2113 12/05/16 0736  GLUCAP 208* 192* 194* 183* 212*       Studies: No results found.    Lab Results  Component Value Date   HGBA1C 9.9 (H) 12/02/2016   HGBA1C 13.5 01/22/2013   HGBA1C 10.2 08/20/2009   Lab Results  Component Value Date   MICROALBUR 2.45 (H) 05/03/2010   LDLCALC 120 (H) 05/02/2010   CREATININE 1.89 (H) 12/05/2016       Scheduled Meds: . dorzolamide  1 drop Both Eyes BID  . doxazosin  4 mg Oral QHS  . insulin aspart  0-9 Units Subcutaneous TID WC  . insulin aspart  4 Units Subcutaneous TID WC  . insulin glargine  30 Units Subcutaneous QHS  . latanoprost  1 drop Both Eyes QHS  . mouth rinse  15 mL Mouth Rinse BID  . sodium chloride  250 mL Intravenous Once  . sodium chloride flush  3 mL Intravenous Q12H  . timolol  1 drop Both Eyes BID   Continuous Infusions:   LOS: 5 days    Time spent: >30 MINS    [facility]  [facility] Pager [phone]. If 7PM-7AM, please contact night-coverage at [email or web address], password [email or web address] 12/05/2016, 10:01 AM  LOS: 5 days

## 2016-12-05 NOTE — Progress Notes (Signed)
Nutrition Education Note  RD consulted via verbal consult from RN due to patient's request for for nutrition education. Pt stated that she was on insulin PTA with better controlled blood sugars. She requested information on low sodium diet for CHF.   RD provided "Low Sodium Nutrition Therapy" handout from the [employer]. Reviewed patient's dietary recall. Provided examples on ways to decrease sodium intake in diet. Discouraged intake of processed foods and use of salt shaker. Encouraged fresh fruits and vegetables as well as whole grain sources of carbohydrates to maximize fiber intake. 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 We discussed sodium guidelines per serving. Discussed ways to balance meals regarding both sodium restriction and balance of carbs. Pt admits that she needs to cut back on her intake of regular soda. We discussed sugary-beverages and how they affect your blood sugar.   RD discussed why it is important for patient to adhere to diet recommendations, and emphasized the role of fluids, foods to avoid, and importance of weighing self daily. Teach back method used.  Expect good compliance.  Body mass index is 46.31 kg/m. Pt meets criteria for Morbid Obesity based on current BMI.  Current diet order is Carb Modified, patient is consuming approximately 100% of meals at this time. Labs and medications reviewed. No further nutrition interventions warranted at this time. RD contact information provided. If additional nutrition issues arise, please re-consult RD.   Scarlette Ar RD, LDN, CSP Inpatient Clinical Dietitian Pager: [phone] After Hours Pager: [phone]

## 2016-12-05 NOTE — Progress Notes (Addendum)
Progress Note  Patient Name: [name] Date of Encounter: 12/05/2016  Primary Cardiologist: Dr. Johnsie Cancel  Subjective   Patient feels weak, some leg pain, denies chest pain and palpitations. She is still short of breath, but this is improving.    Inpatient Medications    Scheduled Meds: . dorzolamide  1 drop Both Eyes BID  . doxazosin  4 mg Oral QHS  . insulin aspart  0-9 Units Subcutaneous TID WC  . insulin aspart  4 Units Subcutaneous TID WC  . insulin glargine  30 Units Subcutaneous QHS  . latanoprost  1 drop Both Eyes QHS  . mouth rinse  15 mL Mouth Rinse BID  . sodium chloride  250 mL Intravenous Once  . sodium chloride flush  3 mL Intravenous Q12H  . timolol  1 drop Both Eyes BID   Continuous Infusions:  PRN Meds: sodium chloride, acetaminophen, ipratropium-albuterol, ondansetron (ZOFRAN) IV, sodium chloride flush, traZODone   Vital Signs    Vitals:   12/04/16 2113 12/05/16 0500 12/05/16 0626 12/05/16 0706  BP: 137/67 127/60 (!) 111/38   Pulse: (!) 56  (!) 58   Resp: 18  18   Temp: 98.3 F (36.8 C)  98.2 F (36.8 C)   TempSrc: Oral  Oral   SpO2: 95%  98%   Weight:    245 lb 1.6 oz (111.2 kg)  Height:        Intake/Output Summary (Last 24 hours) at 12/05/16 1002 Last data filed at 12/05/16 0949  Gross per 24 hour  Intake              600 ml  Output             3300 ml  Net            -2700 ml   Filed Weights   12/03/16 0559 12/04/16 0517 12/05/16 0706  Weight: 251 lb 8 oz (114.1 kg) 246 lb 9.6 oz (111.9 kg) 245 lb 1.6 oz (111.2 kg)     Physical Exam   General: Well developed, well nourished, female appearing in no acute distress. Head: Normocephalic, atraumatic.  Neck: Supple without bruits, JVD Lungs:  Resp regular and unlabored, CTA. Heart: RRR, S1, S2, no murmur; no rub. Abdomen: Soft, non-tender, non-distended with normoactive bowel sounds. No hepatomegaly. No rebound/guarding. No obvious abdominal masses. Extremities: No clubbing,  cyanosis, 1+ edema. Distal pedal pulses are 1+ bilaterally. Neuro: Alert and oriented X 3. Moves all extremities spontaneously. Psych: Normal affect.  Labs    Chemistry Recent Labs Lab 11/30/16 1826  12/03/16 0456 12/04/16 0523 12/05/16 0416  NA 140  < > 137 135 134*  K 4.4  < > 4.6 4.8 4.9  CL 110  < > 106 101 97*  CO2 24  < > 25 27 26   GLUCOSE 111*  < > 257* 223* 231*  BUN 26*  < > 38* 46* 57*  CREATININE 1.44*  < > 1.51* 1.63* 1.89*  CALCIUM 8.7*  < > 8.6* 8.9 9.0  PROT 7.0  --   --  6.9  --   ALBUMIN 3.3*  --   --  3.1*  --   AST 17  --   --  12*  --   ALT 13*  --   --  10*  --   ALKPHOS 72  --   --  64  --   BILITOT 0.3  --   --  0.4  --   GFRNONAA 39*  < >  36* 33* 28*  GFRAA 45*  < > 42* 39* 32*  ANIONGAP 6  < > 6 7 11   < > = values in this interval not displayed.   Hematology  Recent Labs Lab 12/01/16 0540 12/03/16 0456 12/05/16 0416  WBC 7.0 5.7 7.3  RBC 4.10 4.59 4.55  HGB 10.4* 11.7* 11.6*  HCT 34.0* 37.7 37.0  MCV 82.9 82.1 81.3  MCH 25.4* 25.5* 25.5*  MCHC 30.6 31.0 31.4  RDW 16.4* 15.7* 15.3  PLT 175 88* 182    Recent Labs Lab 11/30/16 1833 11/30/16 2104  TROPIPOC 0.00 0.01     BNP  Recent Labs Lab 11/30/16 1815  BNP 107.0*     DDimer   Recent Labs Lab 11/30/16 1826  DDIMER 0.45     Radiology    No results found.   Telemetry    Sinus brady 40-50s - Personally Reviewed  ECG    No new tracings - Personally Reviewed   Cardiac Studies   Echocardiogram 12/02/16: Study Conclusions  - Left ventricle: The cavity size was normal. There was mild   concentric hypertrophy. Systolic function was normal. The   estimated ejection fraction was in the range of 60% to 65%. Wall   motion was normal; there were no regional wall motion   abnormalities. Features are consistent with a pseudonormal left   ventricular filling pattern, with concomitant abnormal relaxation   and increased filling pressure (grade 2 diastolic  dysfunction).   Doppler parameters are consistent with high ventricular filling   pressure. - Mitral valve: There was trivial regurgitation. - Pulmonic valve: There was trivial regurgitation.    Patient Profile     61 y.o. female with a h/o morbid obesity (Body mass index is 47.94 kg/m.) and multiple secondary illnesses related to morbid obesity including sleep apnea, DJD/DDD, DM, HTN, and volume overload.  She is now admitted with worsening volume overload.  She reports that recently, she has been taking meloxicam.  She thinks that this could have contributed to her symptoms.  She reports that she is "trying to eat better".  Positive edema and abdominal distension. Cardiology was consulted  Assessment & Plan    1. Acute on chronic diastolic dysfunction Normal LVF with grade 2 DD on echo.  I/O neg 10L, wgt down 15 lbs. She says her dry wgt is 237.  - discussed again the importance of diet and low sodium   2. Hypertensive cardiovascular and renal disease with CHF B/P stable Stop meloxicam ACE started on adm- now stopped secondary to increased SCr  3. Sinus bradycardia Asymptomatic D/C'ed coreg - SB in the 40-50s   4. Morbid obesity Body mass index is 47.52 kg/m. Lifestyle modification encouraged   5. Acute on chronic renal insufficiency with stage III renal failure chronically Control BP Avoid NSAIDs  Plan- Will discuss IV Lasix with MD. By her assessment she still has 5-10 lbs to go. Hard to tell volume status on exam secondary to morbid obesity.   Signed, Kerin Ransom , PA-C 10:02 AM 12/05/2016 As above, patient seen and examined. She feels better than on admission but continues to have some dyspnea. She feels as though she has 5 pounds of fluid left in her legs and abdomen. She denies chest pain. Her exam is difficult because of obesity. She appears to have trace edema in her thighs. Her renal function is worse today with a BUN of 57 and creatinine 1.89. She also  had some dizziness. I will hold diuretics  today. I would not give her IV fluid which was ordered. I will plan to arrange a right heart catheterization tomorrow to better assess pulmonary pressures and pulmonary capillary wedge pressure. We will adjust diuretics based on results. I have personally reviewed her echocardiogram and there is no evidence of hypertrophic cardiomyopathy. LV function appears to be normal and RV is not dilated.  Kirk Ruths, MD

## 2016-12-05 NOTE — Progress Notes (Signed)
Pt slept well overnight, no any complain of pain and SOB noted, Vitals stable (except HR: running brady on 56's), is having good urine output, will continue to monitor the patient. Palma Holter, RN

## 2016-12-06 ENCOUNTER — Encounter (HOSPITAL_COMMUNITY): Payer: Self-pay | Admitting: Cardiology

## 2016-12-06 ENCOUNTER — Encounter (HOSPITAL_COMMUNITY)
Admission: EM | Disposition: A | Discharge status: Home Health | Payer: Self-pay | Source: Home / Self Care | Attending: Internal Medicine

## 2016-12-06 DIAGNOSIS — I509 Heart failure, unspecified: Secondary | ICD-10-CM

## 2016-12-06 HISTORY — PX: RIGHT HEART CATH: CATH118263

## 2016-12-06 LAB — BASIC METABOLIC PANEL
Anion gap: 8 (ref 5–15)
BUN: 64 mg/dL — AB (ref 6–20)
CALCIUM: 8.9 mg/dL (ref 8.9–10.3)
CO2: 26 mmol/L (ref 22–32)
Chloride: 98 mmol/L — ABNORMAL LOW (ref 101–111)
Creatinine, Ser: 1.86 mg/dL — ABNORMAL HIGH (ref 0.44–1.00)
GFR calc Af Amer: 33 mL/min — ABNORMAL LOW (ref >60)
GFR, EST NON AFRICAN AMERICAN: 28 mL/min — AB (ref >60)
Glucose, Bld: 344 mg/dL — ABNORMAL HIGH (ref 65–99)
POTASSIUM: 5.2 mmol/L — AB (ref 3.5–5.1)
SODIUM: 132 mmol/L — AB (ref 135–145)

## 2016-12-06 LAB — POCT I-STAT 3, VENOUS BLOOD GAS (G3P V)
BICARBONATE: 26.1 mmol/L (ref 20.0–28.0)
BICARBONATE: 26.4 mmol/L (ref 20.0–28.0)
O2 SAT: 67 %
O2 Saturation: 69 %
PCO2 VEN: 47.8 mmHg (ref 44.0–60.0)
PCO2 VEN: 48.1 mmHg (ref 44.0–60.0)
PH VEN: 7.347 (ref 7.250–7.430)
PO2 VEN: 37 mmHg (ref 32.0–45.0)
TCO2: 28 mmol/L (ref 0–100)
TCO2: 28 mmol/L (ref 0–100)
pH, Ven: 7.345 (ref 7.250–7.430)
pO2, Ven: 38 mmHg (ref 32.0–45.0)

## 2016-12-06 LAB — GLUCOSE, CAPILLARY
GLUCOSE-CAPILLARY: 319 mg/dL — AB (ref 65–99)
Glucose-Capillary: 210 mg/dL — ABNORMAL HIGH (ref 65–99)
Glucose-Capillary: 322 mg/dL — ABNORMAL HIGH (ref 65–99)
Glucose-Capillary: 249 mg/dL — ABNORMAL HIGH (ref 65–99)

## 2016-12-06 LAB — CBC
HEMATOCRIT: 41.2 % (ref 36.0–46.0)
Hemoglobin: 12.8 g/dL (ref 12.0–15.0)
MCH: 25.3 pg — ABNORMAL LOW (ref 26.0–34.0)
MCHC: 31.1 g/dL (ref 30.0–36.0)
MCV: 81.6 fL (ref 78.0–100.0)
PLATELETS: 178 10*3/uL (ref 150–400)
RBC: 5.05 MIL/uL (ref 3.87–5.11)
RDW: 15.3 % (ref 11.5–15.5)
WBC: 5.6 10*3/uL (ref 4.0–10.5)

## 2016-12-06 LAB — CREATININE, SERUM
CREATININE: 1.61 mg/dL — AB (ref 0.44–1.00)
GFR calc Af Amer: 39 mL/min — ABNORMAL LOW (ref >60)
GFR calc non Af Amer: 34 mL/min — ABNORMAL LOW (ref >60)

## 2016-12-06 SURGERY — RIGHT HEART CATH
Anesthesia: LOCAL

## 2016-12-06 MED ORDER — ACETAMINOPHEN 325 MG PO TABS
650.0000 mg | ORAL_TABLET | ORAL | Status: DC | PRN
  Administered 2016-12-06 – 2016-12-07 (×3): 650 mg via ORAL
  Filled 2016-12-06 (×3): qty 2

## 2016-12-06 MED ORDER — INSULIN GLARGINE 100 UNIT/ML ~~LOC~~ SOLN
40.0000 [IU] | Freq: Every day | SUBCUTANEOUS | Status: DC
  Administered 2016-12-06: 40 [IU] via SUBCUTANEOUS
  Filled 2016-12-06 (×2): qty 0.4

## 2016-12-06 MED ORDER — FENTANYL CITRATE (PF) 100 MCG/2ML IJ SOLN
INTRAMUSCULAR | Status: AC
  Filled 2016-12-06: qty 2

## 2016-12-06 MED ORDER — SODIUM CHLORIDE 0.9 % IV SOLN: 250.0000 mL | INTRAVENOUS | Status: DC | PRN

## 2016-12-06 MED ORDER — FENTANYL CITRATE (PF) 100 MCG/2ML IJ SOLN
INTRAMUSCULAR | Status: DC | PRN
  Administered 2016-12-06: 25 ug via INTRAVENOUS

## 2016-12-06 MED ORDER — SODIUM CHLORIDE 0.9% FLUSH: 3.0000 mL | INTRAVENOUS | Status: DC | PRN

## 2016-12-06 MED ORDER — SODIUM CHLORIDE 0.9 % IV SOLN
INTRAVENOUS | Status: DC
  Administered 2016-12-06: 05:00:00 via INTRAVENOUS

## 2016-12-06 MED ORDER — SODIUM CHLORIDE 0.9% FLUSH
3.0000 mL | Freq: Two times a day (BID) | INTRAVENOUS | Status: DC
  Administered 2016-12-06 – 2016-12-07 (×2): 3 mL via INTRAVENOUS

## 2016-12-06 MED ORDER — HEPARIN SODIUM (PORCINE) 5000 UNIT/ML IJ SOLN
5000.0000 [IU] | Freq: Three times a day (TID) | INTRAMUSCULAR | Status: DC
  Administered 2016-12-06 – 2016-12-07 (×2): 5000 [IU] via SUBCUTANEOUS
  Filled 2016-12-06 (×2): qty 1

## 2016-12-06 MED ORDER — MIDAZOLAM HCL 2 MG/2ML IJ SOLN
INTRAMUSCULAR | Status: DC | PRN
  Administered 2016-12-06: 1 mg via INTRAVENOUS

## 2016-12-06 MED ORDER — ASPIRIN 81 MG PO CHEW
81.0000 mg | CHEWABLE_TABLET | ORAL | Status: AC
  Administered 2016-12-06: 81 mg via ORAL
  Filled 2016-12-06: qty 1

## 2016-12-06 MED ORDER — SODIUM CHLORIDE 0.9% FLUSH: 3.0000 mL | Freq: Two times a day (BID) | INTRAVENOUS | Status: DC

## 2016-12-06 MED ORDER — SODIUM CHLORIDE 0.9 % IV SOLN: INTRAVENOUS | Status: DC

## 2016-12-06 MED ORDER — ONDANSETRON HCL 4 MG/2ML IJ SOLN: 4.0000 mg | Freq: Four times a day (QID) | INTRAMUSCULAR | Status: DC | PRN

## 2016-12-06 MED ORDER — INSULIN GLARGINE 100 UNIT/ML ~~LOC~~ SOLN
15.0000 [IU] | Freq: Once | SUBCUTANEOUS | Status: AC
  Administered 2016-12-06: 15 [IU] via SUBCUTANEOUS
  Filled 2016-12-06: qty 0.15

## 2016-12-06 MED ORDER — AMLODIPINE BESYLATE 5 MG PO TABS
5.0000 mg | ORAL_TABLET | Freq: Every day | ORAL | Status: DC
  Administered 2016-12-06 – 2016-12-07 (×2): 5 mg via ORAL
  Filled 2016-12-06 (×2): qty 1

## 2016-12-06 MED ORDER — MIDAZOLAM HCL 2 MG/2ML IJ SOLN
INTRAMUSCULAR | Status: AC
  Filled 2016-12-06: qty 2

## 2016-12-06 MED ORDER — LIDOCAINE HCL (PF) 1 % IJ SOLN
INTRAMUSCULAR | Status: DC | PRN
  Administered 2016-12-06: 2 mL

## 2016-12-06 SURGICAL SUPPLY — 7 items
CATH BALLN WEDGE 5F 110CM (CATHETERS) ×2 IMPLANT
PACK CARDIAC CATHETERIZATION (CUSTOM PROCEDURE TRAY) ×2 IMPLANT
SHEATH FAST CATH BRACH 5F 5CM (SHEATH) ×2 IMPLANT
SYR 10CC CONTROL (SYRINGE) IMPLANT
TRANSDUCER W/STOPCOCK (MISCELLANEOUS) ×2 IMPLANT
TUBING ART PRESS 72  MALE/FEM (TUBING) ×1
TUBING ART PRESS 72 MALE/FEM (TUBING) ×1 IMPLANT

## 2016-12-06 NOTE — Progress Notes (Signed)
Progress Note  Patient Name: [name] Date of Encounter: 12/06/2016  Primary Cardiologist: Dr. Johnsie Cancel  Subjective   Pt just back from [place] heart cath. No SOB at rest   Inpatient Medications    Scheduled Meds: . dorzolamide  1 drop Both Eyes BID  . doxazosin  4 mg Oral QHS  . heparin  5,000 Units Subcutaneous Q8H  . insulin aspart  0-9 Units Subcutaneous TID WC  . insulin aspart  4 Units Subcutaneous TID WC  . insulin glargine  15 Units Subcutaneous Once  . insulin glargine  40 Units Subcutaneous QHS  . latanoprost  1 drop Both Eyes QHS  . mouth rinse  15 mL Mouth Rinse BID  . sodium chloride  250 mL Intravenous Once  . sodium chloride flush  3 mL Intravenous Q12H  . sodium chloride flush  3 mL Intravenous Q12H  . sodium chloride flush  3 mL Intravenous Q12H  . timolol  1 drop Both Eyes BID   Continuous Infusions: . sodium chloride 10 mL/hr at 12/06/16 0509   PRN Meds: sodium chloride, sodium chloride, sodium chloride, acetaminophen, ipratropium-albuterol, ondansetron (ZOFRAN) IV, sodium chloride flush, sodium chloride flush, sodium chloride flush, traZODone   Vital Signs    Vitals:   12/06/16 0924 12/06/16 0925 12/06/16 0950 12/06/16 1005  BP:  (!) 163/68 (!) 127/47 (!) 142/45  Pulse: (!) 58 (!) 58 (!) 50 (!) 48  Resp: 16 15    Temp:      TempSrc:      SpO2: 100% 100% 99% 100%  Weight:      Height:        Intake/Output Summary (Last 24 hours) at 12/06/16 1019 Last data filed at 12/06/16 1000  Gross per 24 hour  Intake            688.5 ml  Output             1200 ml  Net           -511.5 ml   Filed Weights   12/03/16 0559 12/04/16 0517 12/05/16 0706  Weight: 251 lb 8 oz (114.1 kg) 246 lb 9.6 oz (111.9 kg) 245 lb 1.6 oz (111.2 kg)     Physical Exam   General: Morbidly obese AA female, NAD Head: Normocephalic, atraumatic.  Neck: Supple without bruits, JVD Lungs:  Resp regular and unlabored, CTA. Heart: RRR, S1, S2, no murmur; no  rub. Extremities: No clubbing, cyanosis, 1+ edema. Distal pedal pulses are 1+ bilaterally. Pressure dressing Lt arm Neuro: Alert and oriented X 3. Moves all extremities spontaneously. Psych: Normal affect.  Labs    Chemistry Recent Labs Lab 11/30/16 1826  12/04/16 0523 12/05/16 0416 12/06/16 0517  NA 140  < > 135 134* 132*  K 4.4  < > 4.8 4.9 5.2*  CL 110  < > 101 97* 98*  CO2 24  < > 27 26 26   GLUCOSE 111*  < > 223* 231* 344*  BUN 26*  < > 46* 57* 64*  CREATININE 1.44*  < > 1.63* 1.89* 1.86*  CALCIUM 8.7*  < > 8.9 9.0 8.9  PROT 7.0  --  6.9  --   --   ALBUMIN 3.3*  --  3.1*  --   --   AST 17  --  12*  --   --   ALT 13*  --  10*  --   --   ALKPHOS 72  --  64  --   --  BILITOT 0.3  --  0.4  --   --   GFRNONAA 39*  < > 33* 28* 28*  GFRAA 45*  < > 39* 32* 33*  ANIONGAP 6  < > 7 11 8   < > = values in this interval not displayed.   Hematology  Recent Labs Lab 12/01/16 0540 12/03/16 0456 12/05/16 0416  WBC 7.0 5.7 7.3  RBC 4.10 4.59 4.55  HGB 10.4* 11.7* 11.6*  HCT 34.0* 37.7 37.0  MCV 82.9 82.1 81.3  MCH 25.4* 25.5* 25.5*  MCHC 30.6 31.0 31.4  RDW 16.4* 15.7* 15.3  PLT 175 88* 182    Recent Labs Lab 11/30/16 1833 11/30/16 2104  TROPIPOC 0.00 0.01     BNP  Recent Labs Lab 11/30/16 1815  BNP 107.0*     DDimer   Recent Labs Lab 11/30/16 1826  DDIMER 0.45     Radiology    CXR 11/30/16- CHEST  2 VIEW  COMPARISON:  09/04/2016  FINDINGS: The lungs are clear wiithout focal pneumonia, edema, pneumothorax or pleural effusion. There is pulmonary vascular congestion without overt pulmonary edema. The cardio pericardial silhouette is enlarged. The visualized bony structures of the thorax are intact.  IMPRESSION: Cardiomegaly with vascular congestion.    Telemetry    Sinus brady 40-50s - Personally Reviewed  ECG    No new tracings - Personally Reviewed   Cardiac Studies   Echocardiogram 12/02/16: Study Conclusions  - Left  ventricle: The cavity size was normal. There was mild   concentric hypertrophy. Systolic function was normal. The   estimated ejection fraction was in the range of 60% to 65%. Wall   motion was normal; there were no regional wall motion   abnormalities. Features are consistent with a pseudonormal left   ventricular filling pattern, with concomitant abnormal relaxation   and increased filling pressure (grade 2 diastolic dysfunction).   Doppler parameters are consistent with high ventricular filling   pressure. - Mitral valve: There was trivial regurgitation. - Pulmonic valve: There was trivial regurgitation.  Rt heart cath 12/05/16- Fick Cardiac Output 5.6 L/min  Fick Cardiac Output Index 2.72 (L/min)/BSA  RA A Wave 11 mmHg  RA V Wave 6 mmHg  RA Mean 6 mmHg  RV Systolic Pressure 48 mmHg  RV Diastolic Pressure 0 mmHg  RV EDP 7 mmHg  PA Systolic Pressure 45 mmHg  PA Diastolic Pressure 14 mmHg  PA Mean 23 mmHg  PW A Wave 12 mmHg  PW V Wave 13 mmHg  PW Mean 8 mmHg  QP/QS 1  TPVR Index 8.46 HRUI      Patient Profile     61 y.o. female with a h/o morbid obesity (Body mass index is 47.94 kg/m.) and multiple secondary illnesses related to morbid obesity including sleep apnea, DJD/DDD, DM, HTN, and volume overload.  She was admitted 11/30/16 with worsening volume overload, edema, and abdominal distension. Cardiology was consulted 12/02/16. Adm wgt-259 lbs  Assessment & Plan    1. Acute on chronic diastolic dysfunction Normal LVF with grade 2 DD on echo.  I/O neg 10.5L, wgt down 15 lbs. Rt heart cath shows she is near baseline wgt - discussed again the importance of diet and low sodium   2. Hypertensive cardiovascular and renal disease with CHF B/P still a little high. ACE started on adm- now stopped secondary to increased SCr  3. Sinus bradycardia Asymptomatic D/C'ed coreg - SB in the 40-50s   4. Morbid obesity Body mass index is 47.52 kg/m.  Lifestyle modification  encouraged   5. Acute on chronic renal insufficiency with stage III renal failure chronically Control BP Avoid NSAIDs  Plan-  Hard to tell volume status on exam secondary to morbid obesity but Rt heart cath shows she is near baseline wgt.   Add Norvasc for HTN.  Will discuss standing diuretic dose with MD-   Signed, Kerin Ransom , PA-C 10:19 AM 12/06/2016 As above, patient seen and examined. Her dyspnea has improved and she states her edema has as well. Right heart catheterization reviewed. Pulmonary capillary wedge pressure 8 and mild pulmonary hypertension. Patient remains prerenal today. Hold diuretics. Will resume lower dose at discharge likely in AM; instructed on weight loss and low Na diet. Add amlodipine for BP. Kirk Ruths, MD

## 2016-12-06 NOTE — Progress Notes (Signed)
[facility] PROGRESS NOTE  [name] VFI:[id number] DOB: 11/14/1955 DOA: 11/30/2016   PCP: Elyn Peers, MD     Assessment/Plan: Principal Problem:   Diastolic CHF, acute on chronic (HCC) Active Problems:   Hyperlipidemia   Morbid obesity (HCC)   Iron deficiency anemia   OSA (obstructive sleep apnea)   Hypertension   CKD (chronic kidney disease) stage 3, GFR 30-59 ml/min   Thrombocytopenia (HCC)   Sinus bradycardia   SOB (shortness of breath)   Hypertensive heart and renal disease   61 y.o. female with medical history significant of a HTN, HLD, DM Type 2, asthma, obesity, OSA not on CPAP; who presents with complaints of progressively worsening shortness of breath over the last week.patient has also had a syncopal episode without any warning few days ago prior to admission. Chest x-ray showed cardiomegaly and pulmonary vascular congestion. Patient admitted for CHF exacerbation. Admitted and discharged 09/06/16 for the same  Assessment and plan Acute on chronic diastolic CHF exacerbation:   presents with worsening shortness of breath. Noted to be hypoxic with ambulation to 86%  on admission. BNP 107,  . Chest x-ray showed cardiomegaly with vascular congestion. Last echocardiogram was in 08/2016 showing EF of 60- 65% with signs of systolic anterior motion without signs of outflow obstruction but features suggestive of HOCM. As per cardiology, there is no evidence of hypertrophic CM and ekg does not have LVH this admission. weights-down 259>253>251>246 >245 pounds >1700 cc , urine output last 24 hours, off diuretics Held Lasix to 60 mg IV 3 times a day 3/20,   due to increasing creatinine, lightheadedness and cramping reported by the patient.  reassess renal function in the morning TSH normal, d-dimer negative Repeat 2-D echo shows, EF of 41-66%, grade 2 diastolic dysfunction, Cardiology follow-up appreciated- recommend aggressive lifestyle change. Hold lisinopril   in  the setting of compromised renal function Consider starting lisinopril prior to discharge right heart catheterization  to better assess pulmonary pressures and pulmonary capillary wedge pressure 3/21, suggestive of sleep apnea as the underlying cause. Resume diuretics tomorrow if renal function continues to improve    Sinus bradycardia: Improving, heart rate in the 60s  Patient previously on Coreg Currently on hold secondary to bradycardia   Diabetes mellitus type 2  . Patient on insulin and Jardiance. - Held [place] as there is recommendation for discontinuation if decreased GFR < 45.  Increase Lantus to  40  units subcutaneous qhs  ,added Novolog 4 units TID with meals  Hemoglobin A1c  9.9 CBG still uncontrolled, increased Lantus as above, will give an extra 15 units this morning Consider ACE inhibitor prior to discharge   Anemia : Chronic. Initial hemoglobin 11.5. baseline hemoglobin appears to be around 10.  Remained stable  Thrombocytopenia :acute.  Platelet count 140 on admission previously seen as low as 151 for in the past. Platelets dropped to 88, discontinued Lovenox. Platelet count has now improved after discontinuation of Lovenox  Essential hypertension - Continue Cardura    Morbid Obesity Body mass index is 46.31 kg/m.   Chronic kidney disease stage III: Stable. Slight increase in creatinine to 1.89>1.86  today, hold diuresis, recheck tomorrow  OSA - Follow-up with outpatient sleep study     DVT prophylaxsis Lovenox> SCDs  Code Status:  Full code    Family Communication: Discussed in detail with the patient, all imaging results, lab results explained to the patient   Disposition Plan:  Reassess renal function and resume  diuretics tomorrow     Consultants:  Cardiology  Procedures:  None  Antibiotics: Anti-infectives    None         HPI/Subjective: Did not sleep well, has a headache  Objective: Vitals:   12/06/16 0914  12/06/16 0919 12/06/16 0924 12/06/16 0925  BP: (!) 146/65 (!) 165/71  (!) 163/68  Pulse: (!) 53 (!) 55 (!) 58 (!) 58  Resp: 14 15 16 15   Temp:      TempSrc:      SpO2: 100% 100% 100% 100%  Weight:      Height:        Intake/Output Summary (Last 24 hours) at 12/06/16 0933 Last data filed at 12/06/16 0600  Gross per 24 hour  Intake            608.5 ml  Output             1200 ml  Net           -591.5 ml    Exam:  Examination:  General exam: Appears calm and comfortable  Respiratory system: Clear to auscultation. Respiratory effort normal. Cardiovascular system: S1 & S2 heard, RRR. No JVD, murmurs, rubs, gallops or clicks.   Gastrointestinal system: Abdomen is nondistended, soft and nontender. No organomegaly or masses felt. Normal bowel sounds heard. Central nervous system: Alert and oriented. No focal neurological deficits. Extremities: Symmetric 5 x 5 power.3+ pitting edema Skin: No rashes, lesions or ulcers Psychiatry: Judgement and insight appear normal. Mood & affect appropriate.     Data Reviewed: I have personally reviewed following labs and imaging studies  Micro Results No results found for this or any previous visit (from the past 240 hour(s)).  Radiology Reports Dg Chest 2 View  Result Date: 11/30/2016 CLINICAL DATA:  Shortness of breath and edema in the legs bilaterally. EXAM: CHEST  2 VIEW COMPARISON:  09/04/2016 FINDINGS: The lungs are clear wiithout focal pneumonia, edema, pneumothorax or pleural effusion. There is pulmonary vascular congestion without overt pulmonary edema. The cardio pericardial silhouette is enlarged. The visualized bony structures of the thorax are intact. IMPRESSION: Cardiomegaly with vascular congestion. Electronically Signed   By: Misty Stanley M.D.   On: 11/30/2016 19:14     CBC  Recent Labs Lab 11/30/16 1826 12/01/16 0540 12/03/16 0456 12/05/16 0416  WBC 7.5 7.0 5.7 7.3  HGB 11.5* 10.4* 11.7* 11.6*  HCT 37.1 34.0* 37.7 37.0   PLT 140* 175 88* 182  MCV 82.4 82.9 82.1 81.3  MCH 25.6* 25.4* 25.5* 25.5*  MCHC 31.0 30.6 31.0 31.4  RDW 15.9* 16.4* 15.7* 15.3  LYMPHSABS 2.0 1.9  --   --   MONOABS 0.5 0.6  --   --   EOSABS 0.3 0.3  --   --   BASOSABS 0.2* 0.0  --   --     Chemistries   Recent Labs Lab 11/30/16 1826  12/02/16 0436 12/03/16 0456 12/04/16 0523 12/05/16 0416 12/06/16 0517  NA 140  < > 136 137 135 134* 132*  K 4.4  < > 4.6 4.6 4.8 4.9 5.2*  CL 110  < > 107 106 101 97* 98*  CO2 24  < > 23 25 27 26 26   GLUCOSE 111*  < > 272* 257* 223* 231* 344*  BUN 26*  < > 34* 38* 46* 57* 64*  CREATININE 1.44*  < > 1.57* 1.51* 1.63* 1.89* 1.86*  CALCIUM 8.7*  < > 8.6* 8.6* 8.9 9.0 8.9  AST 17  --   --   --  12*  --   --   ALT 13*  --   --   --  10*  --   --   ALKPHOS 72  --   --   --  64  --   --   BILITOT 0.3  --   --   --  0.4  --   --   < > = values in this interval not displayed. ------------------------------------------------------------------------------------------------------------------ estimated creatinine clearance is 37.2 mL/min (A) (by C-G formula based on SCr of 1.86 mg/dL (H)). ------------------------------------------------------------------------------------------------------------------ No results for input(s): HGBA1C in the last 72 hours. ------------------------------------------------------------------------------------------------------------------ No results for input(s): CHOL, HDL, LDLCALC, TRIG, CHOLHDL, LDLDIRECT in the last 72 hours. ------------------------------------------------------------------------------------------------------------------ No results for input(s): TSH, T4TOTAL, T3FREE, THYROIDAB in the last 72 hours.  Invalid input(s): FREET3 ------------------------------------------------------------------------------------------------------------------ No results for input(s): VITAMINB12, FOLATE, FERRITIN, TIBC, IRON, RETICCTPCT in the last 72 hours.  Coagulation  profile  Recent Labs Lab 11/30/16 1826  INR 0.96    No results for input(s): DDIMER in the last 72 hours.  Cardiac Enzymes No results for input(s): CKMB, TROPONINI, MYOGLOBIN in the last 168 hours.  Invalid input(s): CK ------------------------------------------------------------------------------------------------------------------ Invalid input(s): POCBNP   CBG:  Recent Labs Lab 12/05/16 0736 12/05/16 1117 12/05/16 1630 12/05/16 2110 12/06/16 0726  GLUCAP 212* 270* 223* 218* 319*       Studies: No results found.    Lab Results  Component Value Date   HGBA1C 9.9 (H) 12/02/2016   HGBA1C 13.5 01/22/2013   HGBA1C 10.2 08/20/2009   Lab Results  Component Value Date   MICROALBUR 2.45 (H) 05/03/2010   LDLCALC 120 (H) 05/02/2010   CREATININE 1.86 (H) 12/06/2016       Scheduled Meds: . dorzolamide  1 drop Both Eyes BID  . doxazosin  4 mg Oral QHS  . insulin aspart  0-9 Units Subcutaneous TID WC  . insulin aspart  4 Units Subcutaneous TID WC  . insulin glargine  35 Units Subcutaneous QHS  . latanoprost  1 drop Both Eyes QHS  . mouth rinse  15 mL Mouth Rinse BID  . sodium chloride  250 mL Intravenous Once  . sodium chloride flush  3 mL Intravenous Q12H  . sodium chloride flush  3 mL Intravenous Q12H  . timolol  1 drop Both Eyes BID   Continuous Infusions: . sodium chloride 10 mL/hr at 12/06/16 0509     LOS: 6 days    Time spent: >30 MINS    [facility]  [facility] Pager [phone]. If 7PM-7AM, please contact night-coverage at [email or web address], password [facility] 12/06/2016, 9:33 AM  LOS: 6 days

## 2016-12-06 NOTE — Interval H&P Note (Signed)
History and Physical Interval Note:  12/06/2016 8:54 AM  [name]  has presented today for surgery, with the diagnosis of acute HF  The various methods of treatment have been discussed with the patient and family. After consideration of risks, benefits and other options for treatment, the patient has consented to  Procedure(s): Right Heart Cath (N/A) as a surgical intervention .  The patient's history has been reviewed, patient examined, no change in status, stable for surgery.  I have reviewed the patient's chart and labs.  Questions were answered to the patient's satisfaction.     [name] [employer]

## 2016-12-06 NOTE — Progress Notes (Signed)
Pt c/a/ox3, vitals within normal limits, insulin administered. Tele removed, ccmd notified.

## 2016-12-06 NOTE — Progress Notes (Signed)
Patient returns from cath lab, left brachial site. Pt is actively using her cell phone with her right arm; without complaints.   Cathlab nurse at bedside when this nurse found patient returned to room without notification at arrival.   Patient received her breakfast tray promptly after arrival.

## 2016-12-06 NOTE — H&P (View-Only) (Signed)
Progress Note  Patient Name: [name] Date of Encounter: 12/05/2016  Primary Cardiologist: Dr. Johnsie Cancel  Subjective   Patient feels weak, some leg pain, denies chest pain and palpitations. She is still short of breath, but this is improving.    Inpatient Medications    Scheduled Meds: . dorzolamide  1 drop Both Eyes BID  . doxazosin  4 mg Oral QHS  . insulin aspart  0-9 Units Subcutaneous TID WC  . insulin aspart  4 Units Subcutaneous TID WC  . insulin glargine  30 Units Subcutaneous QHS  . latanoprost  1 drop Both Eyes QHS  . mouth rinse  15 mL Mouth Rinse BID  . sodium chloride  250 mL Intravenous Once  . sodium chloride flush  3 mL Intravenous Q12H  . timolol  1 drop Both Eyes BID   Continuous Infusions:  PRN Meds: sodium chloride, acetaminophen, ipratropium-albuterol, ondansetron (ZOFRAN) IV, sodium chloride flush, traZODone   Vital Signs    Vitals:   12/04/16 2113 12/05/16 0500 12/05/16 0626 12/05/16 0706  BP: 137/67 127/60 (!) 111/38   Pulse: (!) 56  (!) 58   Resp: 18  18   Temp: 98.3 F (36.8 C)  98.2 F (36.8 C)   TempSrc: Oral  Oral   SpO2: 95%  98%   Weight:    245 lb 1.6 oz (111.2 kg)  Height:        Intake/Output Summary (Last 24 hours) at 12/05/16 1002 Last data filed at 12/05/16 0949  Gross per 24 hour  Intake              600 ml  Output             3300 ml  Net            -2700 ml   Filed Weights   12/03/16 0559 12/04/16 0517 12/05/16 0706  Weight: 251 lb 8 oz (114.1 kg) 246 lb 9.6 oz (111.9 kg) 245 lb 1.6 oz (111.2 kg)     Physical Exam   General: Well developed, well nourished, female appearing in no acute distress. Head: Normocephalic, atraumatic.  Neck: Supple without bruits, JVD Lungs:  Resp regular and unlabored, CTA. Heart: RRR, S1, S2, no murmur; no rub. Abdomen: Soft, non-tender, non-distended with normoactive bowel sounds. No hepatomegaly. No rebound/guarding. No obvious abdominal masses. Extremities: No clubbing,  cyanosis, 1+ edema. Distal pedal pulses are 1+ bilaterally. Neuro: Alert and oriented X 3. Moves all extremities spontaneously. Psych: Normal affect.  Labs    Chemistry Recent Labs Lab 11/30/16 1826  12/03/16 0456 12/04/16 0523 12/05/16 0416  NA 140  < > 137 135 134*  K 4.4  < > 4.6 4.8 4.9  CL 110  < > 106 101 97*  CO2 24  < > 25 27 26   GLUCOSE 111*  < > 257* 223* 231*  BUN 26*  < > 38* 46* 57*  CREATININE 1.44*  < > 1.51* 1.63* 1.89*  CALCIUM 8.7*  < > 8.6* 8.9 9.0  PROT 7.0  --   --  6.9  --   ALBUMIN 3.3*  --   --  3.1*  --   AST 17  --   --  12*  --   ALT 13*  --   --  10*  --   ALKPHOS 72  --   --  64  --   BILITOT 0.3  --   --  0.4  --   GFRNONAA 39*  < >  36* 33* 28*  GFRAA 45*  < > 42* 39* 32*  ANIONGAP 6  < > 6 7 11   < > = values in this interval not displayed.   Hematology  Recent Labs Lab 12/01/16 0540 12/03/16 0456 12/05/16 0416  WBC 7.0 5.7 7.3  RBC 4.10 4.59 4.55  HGB 10.4* 11.7* 11.6*  HCT 34.0* 37.7 37.0  MCV 82.9 82.1 81.3  MCH 25.4* 25.5* 25.5*  MCHC 30.6 31.0 31.4  RDW 16.4* 15.7* 15.3  PLT 175 88* 182    Recent Labs Lab 11/30/16 1833 11/30/16 2104  TROPIPOC 0.00 0.01     BNP  Recent Labs Lab 11/30/16 1815  BNP 107.0*     DDimer   Recent Labs Lab 11/30/16 1826  DDIMER 0.45     Radiology    No results found.   Telemetry    Sinus brady 40-50s - Personally Reviewed  ECG    No new tracings - Personally Reviewed   Cardiac Studies   Echocardiogram 12/02/16: Study Conclusions  - Left ventricle: The cavity size was normal. There was mild   concentric hypertrophy. Systolic function was normal. The   estimated ejection fraction was in the range of 60% to 65%. Wall   motion was normal; there were no regional wall motion   abnormalities. Features are consistent with a pseudonormal left   ventricular filling pattern, with concomitant abnormal relaxation   and increased filling pressure (grade 2 diastolic  dysfunction).   Doppler parameters are consistent with high ventricular filling   pressure. - Mitral valve: There was trivial regurgitation. - Pulmonic valve: There was trivial regurgitation.    Patient Profile     61 y.o. female with a h/o morbid obesity (Body mass index is 47.94 kg/m.) and multiple secondary illnesses related to morbid obesity including sleep apnea, DJD/DDD, DM, HTN, and volume overload.  She is now admitted with worsening volume overload.  She reports that recently, she has been taking meloxicam.  She thinks that this could have contributed to her symptoms.  She reports that she is "trying to eat better".  Positive edema and abdominal distension. Cardiology was consulted  Assessment & Plan    1. Acute on chronic diastolic dysfunction Normal LVF with grade 2 DD on echo.  I/O neg 10L, wgt down 15 lbs. She says her dry wgt is 237.  - discussed again the importance of diet and low sodium   2. Hypertensive cardiovascular and renal disease with CHF B/P stable Stop meloxicam ACE started on adm- now stopped secondary to increased SCr  3. Sinus bradycardia Asymptomatic D/C'ed coreg - SB in the 40-50s   4. Morbid obesity Body mass index is 47.52 kg/m. Lifestyle modification encouraged   5. Acute on chronic renal insufficiency with stage III renal failure chronically Control BP Avoid NSAIDs  Plan- Will discuss IV Lasix with MD. By her assessment she still has 5-10 lbs to go. Hard to tell volume status on exam secondary to morbid obesity.   Signed, Kerin Ransom , PA-C 10:02 AM 12/05/2016 As above, patient seen and examined. She feels better than on admission but continues to have some dyspnea. She feels as though she has 5 pounds of fluid left in her legs and abdomen. She denies chest pain. Her exam is difficult because of obesity. She appears to have trace edema in her thighs. Her renal function is worse today with a BUN of 57 and creatinine 1.89. She also  had some dizziness. I will hold diuretics  today. I would not give her IV fluid which was ordered. I will plan to arrange a right heart catheterization tomorrow to better assess pulmonary pressures and pulmonary capillary wedge pressure. We will adjust diuretics based on results. I have personally reviewed her echocardiogram and there is no evidence of hypertrophic cardiomyopathy. LV function appears to be normal and RV is not dilated.  Kirk Ruths, MD

## 2016-12-07 ENCOUNTER — Other Ambulatory Visit: Payer: Self-pay | Admitting: Physician Assistant

## 2016-12-07 ENCOUNTER — Telehealth: Payer: Self-pay | Admitting: Physician Assistant

## 2016-12-07 DIAGNOSIS — I5033 Acute on chronic diastolic (congestive) heart failure: Secondary | ICD-10-CM

## 2016-12-07 DIAGNOSIS — E781 Pure hyperglyceridemia: Secondary | ICD-10-CM

## 2016-12-07 LAB — COMPREHENSIVE METABOLIC PANEL
ALBUMIN: 3 g/dL — AB (ref 3.5–5.0)
ALT: 11 U/L — AB (ref 14–54)
AST: 13 U/L — ABNORMAL LOW (ref 15–41)
Alkaline Phosphatase: 67 U/L (ref 38–126)
Anion gap: 10 (ref 5–15)
BUN: 59 mg/dL — ABNORMAL HIGH (ref 6–20)
CALCIUM: 8.9 mg/dL (ref 8.9–10.3)
CHLORIDE: 100 mmol/L — AB (ref 101–111)
CO2: 24 mmol/L (ref 22–32)
Creatinine, Ser: 1.6 mg/dL — ABNORMAL HIGH (ref 0.44–1.00)
GFR calc non Af Amer: 34 mL/min — ABNORMAL LOW (ref >60)
GFR, EST AFRICAN AMERICAN: 39 mL/min — AB (ref >60)
Glucose, Bld: 191 mg/dL — ABNORMAL HIGH (ref 65–99)
Potassium: 4.4 mmol/L (ref 3.5–5.1)
Sodium: 134 mmol/L — ABNORMAL LOW (ref 135–145)
Total Bilirubin: 0.6 mg/dL (ref 0.3–1.2)
Total Protein: 6.9 g/dL (ref 6.5–8.1)

## 2016-12-07 LAB — GLUCOSE, CAPILLARY
GLUCOSE-CAPILLARY: 195 mg/dL — AB (ref 65–99)
Glucose-Capillary: 143 mg/dL — ABNORMAL HIGH (ref 65–99)

## 2016-12-07 MED ORDER — AMLODIPINE BESYLATE 5 MG PO TABS: 5.0000 mg | ORAL_TABLET | Freq: Every day | ORAL | 1 refills | Status: DC

## 2016-12-07 MED ORDER — INSULIN GLARGINE 100 UNITS/ML SOLOSTAR PEN: 40.0000 [IU] | PEN_INJECTOR | Freq: Every day | SUBCUTANEOUS | Status: DC

## 2016-12-07 MED ORDER — FUROSEMIDE 20 MG PO TABS: 40.0000 mg | ORAL_TABLET | Freq: Every day | ORAL | 0 refills | Status: DC

## 2016-12-07 MED ORDER — INSULIN GLARGINE 100 UNITS/ML SOLOSTAR PEN: 45.0000 [IU] | PEN_INJECTOR | Freq: Every day | SUBCUTANEOUS | Status: DC

## 2016-12-07 NOTE — Discharge Summary (Addendum)
Physician Discharge Summary  [name] MRN: [id number] DOB/AGE: [birth year] 61 y.o.  PCP: Elyn Peers, MD   Admit date: 11/30/2016 Discharge date: 12/07/2016  Discharge Diagnoses:    Principal Problem:   Diastolic CHF, acute on chronic [facility]) Active Problems:   Hyperlipidemia   Morbid obesity (HCC)   Iron deficiency anemia   OSA (obstructive sleep apnea)   Hypertension   CKD (chronic kidney disease) stage 3, GFR 30-59 ml/min   Thrombocytopenia (HCC)   Sinus bradycardia   SOB (shortness of breath)   Hypertensive heart and renal disease    Follow-up recommendations Follow-up with PCP in 3-5 days , including all  additional recommended appointments as below Follow-up CBC, CMP in 3-5 days       Current Discharge Medication List    START taking these medications   Details  amLODipine (NORVASC) 5 MG tablet Take 1 tablet (5 mg total) by mouth daily. Qty: 30 tablet, Refills: 1      CONTINUE these medications which have CHANGED   Details  furosemide (LASIX) 20 MG tablet Take 2 tablets (40 mg total) by mouth daily. For edema Qty: 15 tablet, Refills: 0    insulin glargine (LANTUS) 100 unit/mL SOPN Inject 0.4 mLs (45 Units total) into the skin at bedtime.      CONTINUE these medications which have NOT CHANGED   Details  acetaminophen (TYLENOL) 500 MG tablet Take 1,000 mg by mouth every 6 (six) hours as needed.    albuterol (PROVENTIL HFA;VENTOLIN HFA) 108 (90 BASE) MCG/ACT inhaler Inhale 2 puffs into the lungs every 6 (six) hours as needed for wheezing or shortness of breath. For shortness of breath    dorzolamide-timolol (COSOPT) 22.3-6.8 MG/ML ophthalmic solution Place 1 drop into both eyes 2 (two) times daily.    doxazosin (CARDURA) 4 MG tablet Take 4 mg by mouth at bedtime.      Take 25 mg by mouth every morning.     potassium chloride SA (K-DUR,KLOR-CON) 20 MEQ tablet Take 20 mEq by mouth every other day.  Refills: 6    Travoprost, BAK Free, (TRAVATAN)  0.004 % SOLN ophthalmic solution Place 1 drop into both eyes at bedtime.      STOP taking these medications     carvedilol (COREG) 6.25 MG tablet      meloxicam (MOBIC) 15 MG tablet          Discharge Condition: Stable   Discharge Instructions Get Medicines reviewed and adjusted: Please take all your medications with you for your next visit with your Primary MD  Please request your Primary MD to go over all hospital tests and procedure/radiological results at the follow up, please ask your Primary MD to get all Hospital records sent to his/her office.  If you experience worsening of your admission symptoms, develop shortness of breath, life threatening emergency, suicidal or homicidal thoughts you must seek medical attention immediately by calling 911 or calling your MD immediately if symptoms less severe.  You must read complete instructions/literature along with all the possible adverse reactions/side effects for all the Medicines you take and that have been prescribed to you. Take any new Medicines after you have completely understood and accpet all the possible adverse reactions/side effects.   Do not drive when taking Pain medications.   Do not take more than prescribed Pain, Sleep and Anxiety Medications  Special Instructions: If you have smoked or chewed Tobacco in the last 2 yrs please stop smoking, stop any regular Alcohol and or any  Recreational drug use.  Wear Seat belts while driving.  Please note  You were cared for by a hospitalist during your hospital stay. Once you are discharged, your primary care physician will handle any further medical issues. Please note that NO REFILLS for any discharge medications will be authorized once you are discharged, as it is imperative that you return to your primary care physician (or establish a relationship with a primary care physician if you do not have one) for your aftercare needs so that they can reassess your need for  medications and monitor your lab values.     Allergies  Allergen Reactions  . Pork-Derived Products Nausea And Vomiting      Disposition: 01-Home or Self Care   Consults: Cardiology    Significant Diagnostic Studies:  Dg Chest 2 View  Result Date: 11/30/2016 CLINICAL DATA:  Shortness of breath and edema in the legs bilaterally. EXAM: CHEST  2 VIEW COMPARISON:  09/04/2016 FINDINGS: The lungs are clear wiithout focal pneumonia, edema, pneumothorax or pleural effusion. There is pulmonary vascular congestion without overt pulmonary edema. The cardio pericardial silhouette is enlarged. The visualized bony structures of the thorax are intact. IMPRESSION: Cardiomegaly with vascular congestion. Electronically Signed   By: Misty Stanley M.D.   On: 11/30/2016 19:14    2-D echo LV EF: 60% -   65%  ------------------------------------------------------------------- Indications:      Syncope 780.2.  ------------------------------------------------------------------- History:   PMH:   Dyspnea.  Risk factors:  Obstructive sleep apnea. Chronic kidney disease. Hypertension. Dyslipidemia.  ------------------------------------------------------------------- Study Conclusions  - Left ventricle: The cavity size was normal. There was mild   concentric hypertrophy. Systolic function was normal. The   estimated ejection fraction was in the range of 60% to 65%. Wall   motion was normal; there were no regional wall motion   abnormalities. Features are consistent with a pseudonormal left   ventricular filling pattern, with concomitant abnormal relaxation   and increased filling pressure (grade 2 diastolic dysfunction).   Doppler parameters are consistent with high ventricular filling   pressure. - Mitral valve: There was trivial regurgitation. - Pulmonic valve: There was trivial regurgitation.   Cardiac catheterization Procedures   Right Heart Cath  Conclusion   1. Normal right and  left heart filling pressures.  2. Mild pulmonary hypertension, ?related to sleep apnea.      Filed Weights   12/05/16 0706 12/06/16 1500 12/07/16 0622  Weight: 111.2 kg (245 lb 1.6 oz) 111.9 kg (246 lb 9.6 oz) 111.9 kg (246 lb 11.2 oz)     Microbiology: No results found for this or any previous visit (from the past 240 hour(s)).     Blood Culture    Component Value Date/Time   SDES ABSCESS GROIN LEFT 04/28/2009 1530   SPECREQUEST NONE 04/28/2009 1530   CULT  04/28/2009 1530    MULTIPLE ORGANISMS PRESENT, NONE PREDOMINANT Note: NO STAPHYLOCOCCUS AUREUS ISOLATED NO GROUP A STREP (S.PYOGENES) ISOLATED   REPTSTATUS 05/01/2009 FINAL 04/28/2009 1530      Labs: Results for orders placed or performed during the hospital encounter of 11/30/16 (from the past 48 hour(s))  Glucose, capillary     Status: Abnormal   Collection Time: 12/05/16 11:17 AM  Result Value Ref Range   Glucose-Capillary 270 (H) 65 - 99 mg/dL   Comment 1 Document in Chart   Glucose, capillary     Status: Abnormal   Collection Time: 12/05/16  4:30 PM  Result Value Ref Range   Glucose-Capillary 223 (  H) 65 - 99 mg/dL   Comment 1 Notify RN   Glucose, capillary     Status: Abnormal   Collection Time: 12/05/16  9:10 PM  Result Value Ref Range   Glucose-Capillary 218 (H) 65 - 99 mg/dL   Comment 1 Notify RN    Comment 2 Document in Chart   Basic metabolic panel     Status: Abnormal   Collection Time: 12/06/16  5:17 AM  Result Value Ref Range   Sodium 132 (L) 135 - 145 mmol/L   Potassium 5.2 (H) 3.5 - 5.1 mmol/L   Chloride 98 (L) 101 - 111 mmol/L   CO2 26 22 - 32 mmol/L   Glucose, Bld 344 (H) 65 - 99 mg/dL   BUN 64 (H) 6 - 20 mg/dL   Creatinine, Ser 1.86 (H) 0.44 - 1.00 mg/dL   Calcium 8.9 8.9 - 10.3 mg/dL   GFR calc non Af Amer 28 (L) >60 mL/min   GFR calc Af Amer 33 (L) >60 mL/min    Comment: (NOTE) The eGFR has been calculated using the CKD EPI equation. This calculation has not been validated in all  clinical situations. eGFR's persistently <60 mL/min signify possible Chronic Kidney Disease.    Anion gap 8 5 - 15  Glucose, capillary     Status: Abnormal   Collection Time: 12/06/16  7:26 AM  Result Value Ref Range   Glucose-Capillary 319 (H) 65 - 99 mg/dL   Comment 1 Notify RN    Comment 2 Document in Chart   I-STAT 3, venous blood gas (G3P V)     Status: None   Collection Time: 12/06/16  9:22 AM  Result Value Ref Range   pH, Ven 7.347 7.250 - 7.430   pCO2, Ven 48.1 44.0 - 60.0 mmHg   pO2, Ven 38.0 32.0 - 45.0 mmHg   Bicarbonate 26.4 20.0 - 28.0 mmol/L   TCO2 28 0 - 100 mmol/L   O2 Saturation 69.0 %   Patient temperature HIDE    Sample type MIXED VENOUS SAMPLE    Comment NOTIFIED PHYSICIAN   I-STAT 3, venous blood gas (G3P V)     Status: None   Collection Time: 12/06/16  9:22 AM  Result Value Ref Range   pH, Ven 7.345 7.250 - 7.430   pCO2, Ven 47.8 44.0 - 60.0 mmHg   pO2, Ven 37.0 32.0 - 45.0 mmHg   Bicarbonate 26.1 20.0 - 28.0 mmol/L   TCO2 28 0 - 100 mmol/L   O2 Saturation 67.0 %   Patient temperature HIDE    Sample type MIXED VENOUS SAMPLE    Comment NOTIFIED PHYSICIAN   Creatinine, serum     Status: Abnormal   Collection Time: 12/06/16 11:27 AM  Result Value Ref Range   Creatinine, Ser 1.61 (H) 0.44 - 1.00 mg/dL   GFR calc non Af Amer 34 (L) >60 mL/min   GFR calc Af Amer 39 (L) >60 mL/min    Comment: (NOTE) The eGFR has been calculated using the CKD EPI equation. This calculation has not been validated in all clinical situations. eGFR's persistently <60 mL/min signify possible Chronic Kidney Disease.   Glucose, capillary     Status: Abnormal   Collection Time: 12/06/16 12:17 PM  Result Value Ref Range   Glucose-Capillary 210 (H) 65 - 99 mg/dL   Comment 1 Notify RN    Comment 2 Document in Chart   CBC     Status: Abnormal   Collection Time: 12/06/16  2:28 PM  Result Value Ref Range   WBC 5.6 4.0 - 10.5 K/uL   RBC 5.05 3.87 - 5.11 MIL/uL   Hemoglobin  12.8 12.0 - 15.0 g/dL   HCT 41.2 36.0 - 46.0 %   MCV 81.6 78.0 - 100.0 fL   MCH 25.3 (L) 26.0 - 34.0 pg   MCHC 31.1 30.0 - 36.0 g/dL   RDW 15.3 11.5 - 15.5 %   Platelets 178 150 - 400 K/uL    Comment: REPEATED TO VERIFY  Glucose, capillary     Status: Abnormal   Collection Time: 12/06/16  4:23 PM  Result Value Ref Range   Glucose-Capillary 322 (H) 65 - 99 mg/dL   Comment 1 Notify RN    Comment 2 Document in Chart   Glucose, capillary     Status: Abnormal   Collection Time: 12/06/16  9:40 PM  Result Value Ref Range   Glucose-Capillary 249 (H) 65 - 99 mg/dL  Comprehensive metabolic panel     Status: Abnormal   Collection Time: 12/07/16  4:33 AM  Result Value Ref Range   Sodium 134 (L) 135 - 145 mmol/L   Potassium 4.4 3.5 - 5.1 mmol/L   Chloride 100 (L) 101 - 111 mmol/L   CO2 24 22 - 32 mmol/L   Glucose, Bld 191 (H) 65 - 99 mg/dL   BUN 59 (H) 6 - 20 mg/dL   Creatinine, Ser 1.60 (H) 0.44 - 1.00 mg/dL   Calcium 8.9 8.9 - 10.3 mg/dL   Total Protein 6.9 6.5 - 8.1 g/dL   Albumin 3.0 (L) 3.5 - 5.0 g/dL   AST 13 (L) 15 - 41 U/L   ALT 11 (L) 14 - 54 U/L   Alkaline Phosphatase 67 38 - 126 U/L   Total Bilirubin 0.6 0.3 - 1.2 mg/dL   GFR calc non Af Amer 34 (L) >60 mL/min   GFR calc Af Amer 39 (L) >60 mL/min    Comment: (NOTE) The eGFR has been calculated using the CKD EPI equation. This calculation has not been validated in all clinical situations. eGFR's persistently <60 mL/min signify possible Chronic Kidney Disease.    Anion gap 10 5 - 15     Lipid Panel     Component Value Date/Time   CHOL 197 05/02/2010 2127   TRIG 88 05/02/2010 2127   HDL 59 05/02/2010 2127   CHOLHDL 3.3 Ratio 05/02/2010 2127   VLDL 18 05/02/2010 2127   LDLCALC 120 (H) 05/02/2010 2127     Lab Results  Component Value Date   HGBA1C 9.9 (H) 12/02/2016   HGBA1C 13.5 01/22/2013   HGBA1C 10.2 08/20/2009        HPI :*  61 y.o.femalewith medical history significant of a HTN, HLD, DM Type  2,asthma, obesity, OSA not on CPAP; who presents with complaints of progressively worsening shortness of breath over the last week.patient has also had a syncopal episode without any warning few days ago prior to admission. Chest x-ray showed cardiomegaly and pulmonary vascular congestion. Patient admitted for CHF exacerbation. Admitted and discharged 09/06/16 for the same  HOSPITAL COURSE:   Acute on chronic diastolic CHF exacerbation:   presents with worsening shortness of breath. Noted to be hypoxic with ambulation to 86%  on admission. BNP 107,  . Chest x-ray showed cardiomegaly with vascular congestion. Last echocardiogram was in 08/2016 showing EF of 60- 65% with signs of systolic anterior motion without signs of outflow obstruction but features suggestive of HOCM. As per  cardiology, there is no evidence of hypertrophic CM and ekg does not have LVH this admission. weights-down 259>253>251>246 >245 pounds >1250 cc , urine output last 24 hours,   She was initially diuresed with Lasix  60 mg IV 3 times a day  due to increasing creatinine, lightheadedness and cramping reported by the patient, Lasix was held for 2 days. I/O neg 10.5L, wgt down 15 lbs. Rt heart cath shows she is near baseline wgt. Creatinine returned to her baseline 1.6 and po Lasix was resumed TSH normal, d-dimer negative Repeat 2-D echo shows, EF of 58-52%, grade 2 diastolic dysfunction, Cardiology  Recommended  aggressive lifestyle change. Hold lisinopril   in the setting of compromised renal function Consider starting lisinopril his renal function remained stable in the future right heart catheterization  to better assess pulmonary pressures and pulmonary capillary wedge pressure 3/21, suggestive of sleep apnea as the underlying cause. Patient may benefit from outpatient sleep study discharged on Lasix 40 mg daily and potassium 20 once daily. Check potassium and renal function 3/26. Patient educated on low sodium diet and fluid  restriction. She will weigh daily and take an additional 40 mg for weight gain of 2-3 pounds.   Sinus bradycardia:  heart rate remained in the 50s to 60s  Patient previously on Coreg, which was discontinued Currently on hold secondary to bradycardia   Diabetes mellitus type 2  . Continues to be poorly controlled Patient on Lantus and Jardiance. - Held Jardianceas there is recommendation for discontinuation if decreased GFR <45.  Increase Lantus to  45  units subcutaneous qhs  ,added Novolog 4 units TID with meals  Hemoglobin A1c  9.9     Anemia : Chronic. Initial hemoglobin 11.5. baseline hemoglobin appears to be around 10.  Remained stable  Thrombocytopenia :acute. Platelet count 140 on admission previously seen as low as 151 for in the past. Platelets dropped to 88, discontinued Lovenox. Platelet count has now improved after discontinuation of Lovenox  Essential hypertension - Continue Cardura   Morbid Obesity Body mass index is 46.31 kg/m.   Chronic kidney disease stage III: Stable. Slight increase in creatinine to 1.89>1.86  today, hold diuresis, recheck tomorrow  OSA - Follow-up with outpatient sleep study     Discharge Exam:   Blood pressure (!) 122/53, pulse 63, temperature 98 F (36.7 C), temperature source Oral, resp. rate 18, height 5' 1"  (1.549 m), weight 111.9 kg (246 lb 11.2 oz), SpO2 100 %. General: Morbidly obese AA female, NAD Head: Normocephalic, atraumatic.      Neck: Supple without bruits, JVD Lungs:  Resp regular and unlabored, CTA. Heart: RRR, S1, S2, no murmur; no rub. Extremities: No clubbing, cyanosis, 1+ edema. Distal pedal pulses are 1+ bilaterally. Pressure dressing Lt arm Neuro: Alert and oriented X 3. Moves all extremities spontaneously. Psych: Normal affect.      Follow-up Information    Elyn Peers, MD. Call.   Specialty:  Family Medicine Why:  To make appointment, hospital follow-up. Check CBC, BMP Contact  information: [address] [place] [address] [phone]        Thompson Grayer, MD. Call.   Specialty:  Cardiology Why:  To make follow-up appointment, hospital follow-up Contact information: [address]  [place] [address] [phone]           Signed: Reyne Dumas 12/07/2016, 8:12 AM        Time spent >45 mins

## 2016-12-07 NOTE — Progress Notes (Signed)
Progress Note  Patient Name: [name] Date of Encounter: 12/07/2016  Primary Cardiologist: Dr. Johnsie Cancel  Subjective   No dyspnea or CP   Inpatient Medications    Scheduled Meds: . amLODipine  5 mg Oral Daily  . dorzolamide  1 drop Both Eyes BID  . doxazosin  4 mg Oral QHS  . heparin  5,000 Units Subcutaneous Q8H  . insulin aspart  0-9 Units Subcutaneous TID WC  . insulin aspart  4 Units Subcutaneous TID WC  . insulin glargine  40 Units Subcutaneous QHS  . latanoprost  1 drop Both Eyes QHS  . mouth rinse  15 mL Mouth Rinse BID  . sodium chloride  250 mL Intravenous Once  . sodium chloride flush  3 mL Intravenous Q12H  . sodium chloride flush  3 mL Intravenous Q12H  . sodium chloride flush  3 mL Intravenous Q12H  . timolol  1 drop Both Eyes BID   Continuous Infusions: . sodium chloride Stopped (12/07/16 0541)   PRN Meds: sodium chloride, sodium chloride, sodium chloride, acetaminophen, ipratropium-albuterol, ondansetron (ZOFRAN) IV, sodium chloride flush, sodium chloride flush, sodium chloride flush, traZODone   Vital Signs    Vitals:   12/06/16 1210 12/06/16 1500 12/06/16 2132 12/07/16 0622  BP: (!) 144/54  (!) 137/43 (!) 122/53  Pulse:   64 63  Resp:   17 18  Temp:   97.8 F (36.6 C) 98 F (36.7 C)  TempSrc:   Oral Oral  SpO2:   100% 100%  Weight:  246 lb 9.6 oz (111.9 kg)  246 lb 11.2 oz (111.9 kg)  Height:        Intake/Output Summary (Last 24 hours) at 12/07/16 1059 Last data filed at 12/07/16 0900  Gross per 24 hour  Intake              750 ml  Output             1850 ml  Net            -1100 ml   Filed Weights   12/05/16 0706 12/06/16 1500 12/07/16 0622  Weight: 245 lb 1.6 oz (111.2 kg) 246 lb 9.6 oz (111.9 kg) 246 lb 11.2 oz (111.9 kg)     Physical Exam   General: Morbidly obese AA female, NAD Head: Normal Neck: Supple  Lungs:  CTA. Heart: RRR Extremities: No edema Neuro: Grossly intact Psych: Normal affect.  Labs     Chemistry Recent Labs Lab 11/30/16 1826  12/04/16 0523 12/05/16 0416 12/06/16 0517 12/06/16 1127 12/07/16 0433  NA 140  < > 135 134* 132*  --  134*  K 4.4  < > 4.8 4.9 5.2*  --  4.4  CL 110  < > 101 97* 98*  --  100*  CO2 24  < > 27 26 26   --  24  GLUCOSE 111*  < > 223* 231* 344*  --  191*  BUN 26*  < > 46* 57* 64*  --  59*  CREATININE 1.44*  < > 1.63* 1.89* 1.86* 1.61* 1.60*  CALCIUM 8.7*  < > 8.9 9.0 8.9  --  8.9  PROT 7.0  --  6.9  --   --   --  6.9  ALBUMIN 3.3*  --  3.1*  --   --   --  3.0*  AST 17  --  12*  --   --   --  13*  ALT 13*  --  10*  --   --   --  11*  ALKPHOS 72  --  64  --   --   --  67  BILITOT 0.3  --  0.4  --   --   --  0.6  GFRNONAA 39*  < > 33* 28* 28* 34* 34*  GFRAA 45*  < > 39* 32* 33* 39* 39*  ANIONGAP 6  < > 7 11 8   --  10  < > = values in this interval not displayed.   Hematology  Recent Labs Lab 12/03/16 0456 12/05/16 0416 12/06/16 1428  WBC 5.7 7.3 5.6  RBC 4.59 4.55 5.05  HGB 11.7* 11.6* 12.8  HCT 37.7 37.0 41.2  MCV 82.1 81.3 81.6  MCH 25.5* 25.5* 25.3*  MCHC 31.0 31.4 31.1  RDW 15.7* 15.3 15.3  PLT 88* 182 178    Recent Labs Lab 11/30/16 1833 11/30/16 2104  TROPIPOC 0.00 0.01     BNP  Recent Labs Lab 11/30/16 1815  BNP 107.0*     DDimer   Recent Labs Lab 11/30/16 1826  DDIMER 0.45     Radiology    CXR 11/30/16- CHEST  2 VIEW  COMPARISON:  09/04/2016  FINDINGS: The lungs are clear wiithout focal pneumonia, edema, pneumothorax or pleural effusion. There is pulmonary vascular congestion without overt pulmonary edema. The cardio pericardial silhouette is enlarged. The visualized bony structures of the thorax are intact.  IMPRESSION: Cardiomegaly with vascular congestion.    Telemetry    Sinus brady 50s - Personally Reviewed   Cardiac Studies   Echocardiogram 12/02/16: Study Conclusions  - Left ventricle: The cavity size was normal. There was mild   concentric hypertrophy. Systolic  function was normal. The   estimated ejection fraction was in the range of 60% to 65%. Wall   motion was normal; there were no regional wall motion   abnormalities. Features are consistent with a pseudonormal left   ventricular filling pattern, with concomitant abnormal relaxation   and increased filling pressure (grade 2 diastolic dysfunction).   Doppler parameters are consistent with high ventricular filling   pressure. - Mitral valve: There was trivial regurgitation. - Pulmonic valve: There was trivial regurgitation.  Rt heart cath 12/05/16- Fick Cardiac Output 5.6 L/min  Fick Cardiac Output Index 2.72 (L/min)/BSA  RA A Wave 11 mmHg  RA V Wave 6 mmHg  RA Mean 6 mmHg  RV Systolic Pressure 48 mmHg  RV Diastolic Pressure 0 mmHg  RV EDP 7 mmHg  PA Systolic Pressure 45 mmHg  PA Diastolic Pressure 14 mmHg  PA Mean 23 mmHg  PW A Wave 12 mmHg  PW V Wave 13 mmHg  PW Mean 8 mmHg  QP/QS 1  TPVR Index 8.46 HRUI      Patient Profile     61 y.o. female with a h/o morbid obesity (Body mass index is 47.94 kg/m.) and multiple secondary illnesses related to morbid obesity including sleep apnea, DJD/DDD, DM, HTN, and volume overload.  She was admitted 11/30/16 with worsening volume overload, edema, and abdominal distension. Cardiology was consulted 12/02/16. Adm wgt-259 lbs  Assessment & Plan    1. Acute on chronic diastolic dysfunction Normal LVF with grade 2 DD on echo.  -Right heart cath revealed normal filling pressures after diuresis. Would discharge on Lasix 40 mg daily and potassium 20 once daily. Check potassium and renal function 3/26. Patient educated on low sodium diet and fluid restriction. She will weigh daily and take an additional 40 mg for weight gain of 2-3 pounds.  2. Hypertensive cardiovascular and renal disease with CHF Continue amlodipine at discharge.  3. Sinus bradycardia Improved. Carvedilol discontinued.   4. Morbid obesity Body mass index is 47.52  kg/m. Lifestyle modification encouraged   5. Acute on chronic renal insufficiency with stage III renal failure chronically Avoid NSAIDs Plan to repeat BMET 3/26.  Okay to discharge today. Transition of care appointment one week. Follow-up with Dr. Johnsie Cancel 8 weeks    Signed, Kirk Ruths , MD 10:59 AM 12/07/2016

## 2016-12-07 NOTE — Telephone Encounter (Signed)
New message      TOC appt made on 12-05-16 with Nell Range per Janace Hoard

## 2016-12-07 NOTE — Discharge Instructions (Signed)
She will weigh daily and take an additional 40 mg for weight gain of 2-3 pounds.

## 2016-12-07 NOTE — Care Management Note (Signed)
Case Management Note  Patient Details  Name: [name] MRN: [id number] Date of Birth: 09/28/1955  Subjective/Objective:   Admitted with  CHF              Action/Plan: Patient lives at home with her daughter; PCP: Elyn Peers, MD; has private insurance with Medicaid with prescription drug coverage; DME- cane, walker and rollater at home; [facility] choices offered, patient chose [place]; Butch Penny with [facility] called for arrangements.  Expected Discharge Date:  12/07/16               Expected Discharge Plan:  [place]  Discharge planning Services  CM Consult  HH Arranged:  RN, Nurse's Aide [facility] Agency:  [place]  Status of Service:  In process, will continue to follow  Sherrilyn Rist [phone] 12/07/2016, 2:27 PM

## 2016-12-07 NOTE — Progress Notes (Signed)
Pt has orders to be discharged. Discharge instructions given and pt has no additional questions at this time. Medication regimen reviewed and pt educated. Pt verbalized understanding and has no additional questions. Telemetry box removed. IV removed and site in good condition. Pt stable and waiting for transportation.  Kiyara Bouffard RN 

## 2016-12-07 NOTE — Telephone Encounter (Signed)
Appt is actually 12/15/16.  Pt still admitted.

## 2016-12-08 NOTE — Telephone Encounter (Signed)
Patient contacted regarding discharge from [facility] on 12/07/2016.  Patient understands to follow up with provider Nell Range, PA-C on 12/15/2016 at 1530 at [facility] location . Patient understands discharge instructions? Yes I reviewed discharge instructions with patient.  Patient understands medications and regiment? Yes. Patient voiced questions about insulin changes. I advised her to contact her PCP in regards to insulin questions.   Patient understands to bring all medications to this visit? Yes  Patient voiced understanding of above conversation and agreed with plan.  She voiced thanks for call.

## 2016-12-14 NOTE — Progress Notes (Signed)
Cardiology Office Note    Date:  12/15/2016   ID:  [name], DOB Apr 10, 1956, MRN [id number]  PCP:  Javier Docker, MD  Cardiologist:  Dr. Johnsie Cancel   CC: post hospital follow up   History of Present Illness:  [name] is a 61 y.o. female with a history of morbid obesity, OSA, DMT2, HTN, CKD, sinus bradycardia and chronic diastolic CHF who presents to clinic for post hospital follow up  Recently admitted 4/16-6/06/30 for A/C diastolic CHF. Normal LVF with grade 2 DD on echo. Right heart cath revealed normal filling pressures after diuresis. Discharged on Lasix 40 mg daily and potassium 20 once daily. Patient educated on low sodium diet and fluid restriction. Plan for daily weights and to take an additional 40 mg for weight gain of 2-3 pounds. Discharge 246 lbs. BB was discontinued given sinus bradycardia. Creat 1.6 at discharge .She was supposed to have BMET done on 12/11/16, but this was never done.  Today she presents to clinic for follow up. Since leaving the hospital she has breathing better.  She has had some chest pain. It comes on randomly and not related to exertion. She has some worsening LE edema. She has been weight herself daily and home health has been coming to see her. When she got home from the hospital she was 249 on her scale and today it was 250.2. No orthopnea or PND. She falls asleep in the middle of doing something and does snore a lot. No dizziness or syncope.     Past Medical History:  Diagnosis Date  . Achilles rupture   . Anxiety   . Arthritis   . Asthma   . Chronic back pain   . Chronic headaches   . Chronic leg pain    bilaterally  . Depression   . Diabetes mellitus 1999   T2DM  . Glaucoma   . H/O gestational diabetes mellitus, not currently pregnant   . Hyperlipidemia   . Hypertension   . Morbid obesity ([name])   . OSA (obstructive sleep apnea)   . Sciatica     Past Surgical History:  Procedure Laterality Date  . BACK SURGERY    .  BREAST LUMPECTOMY     Right breast  . CERVICAL SPINE SURGERY    . CESAREAN SECTION  12/10/89  . CHOLECYSTECTOMY  1991  . LUMBAR SPINE SURGERY    . RIGHT HEART CATH N/A 12/06/2016   Procedure: Right Heart Cath;  Surgeon: Larey Dresser, MD;  Location: [facility];  Service: Cardiovascular;  Laterality: N/A;  . TUBAL LIGATION      Current Medications: Outpatient Medications Prior to Visit  Medication Sig Dispense Refill  . acetaminophen (TYLENOL) 500 MG tablet Take 1,000 mg by mouth every 6 (six) hours as needed.    Marland Kitchen albuterol (PROVENTIL HFA;VENTOLIN HFA) 108 (90 BASE) MCG/ACT inhaler Inhale 2 puffs into the lungs every 6 (six) hours as needed for wheezing or shortness of breath. For shortness of breath    . amLODipine (NORVASC) 5 MG tablet Take 1 tablet (5 mg total) by mouth daily. 30 tablet 1  . dorzolamide-timolol (COSOPT) 22.3-6.8 MG/ML ophthalmic solution Place 1 drop into both eyes 2 (two) times daily.    Marland Kitchen doxazosin (CARDURA) 4 MG tablet Take 4 mg by mouth at bedtime.    . insulin glargine (LANTUS) 100 unit/mL SOPN Inject 0.45 mLs (45 Units total) into the skin at bedtime. 15 mL   . potassium chloride SA (  K-DUR,KLOR-CON) 20 MEQ tablet Take 20 mEq by mouth every other day.   6  . Travoprost, BAK Free, (TRAVATAN) 0.004 % SOLN ophthalmic solution Place 1 drop into both eyes at bedtime.    . furosemide (LASIX) 20 MG tablet Take 2 tablets (40 mg total) by mouth daily. For edema 15 tablet 0   No facility-administered medications prior to visit.      Allergies:   Pork-derived products   Social History   Social History  . Marital status: Single    Spouse name: N/A  . Number of children: N/A  . Years of education: N/A   Social History Main Topics  . Smoking status: Former Smoker    Types: Cigarettes    Quit date: 09/04/1998  . Smokeless tobacco: Never Used  . Alcohol use Yes     Comment: occasional  . Drug use: No  . Sexual activity: Yes    Birth control/ protection:  None   Other Topics Concern  . None   Social History Narrative  . None     Family History:  The patient's family history includes Heart failure in her mother; Renal Disease in her mother.      ROS:   Please see the history of present illness.    ROS All other systems reviewed and are negative.   PHYSICAL EXAM:   VS:  BP 110/62   Pulse (!) 59   Ht 5' 1.5" (1.562 m)   Wt 252 lb 12.8 oz (114.7 kg)   SpO2 98%   BMI 46.99 kg/m    GEN: Well nourished, well developed, in no acute distress  HEENT: normal  Neck: no JVD, carotid bruits, or masses Cardiac: RRR; no murmurs, rubs, or gallops,no edema  Respiratory:  clear to auscultation bilaterally, normal work of breathing GI: soft, nontender, nondistended, + BS MS: no deformity or atrophy  Skin: warm and dry, no rash Neuro:  Alert and Oriented x 3, Strength and sensation are intact Psych: euthymic mood, full affect    Wt Readings from Last 3 Encounters:  12/15/16 252 lb 12.8 oz (114.7 kg)  12/07/16 246 lb 11.2 oz (111.9 kg)  09/06/16 246 lb 1.6 oz (111.6 kg)      Studies/Labs Reviewed:   EKG:  EKG is NOT ordered today.    Recent Labs: 11/30/2016: B Natriuretic Peptide 107.0; TSH 3.247 12/06/2016: Hemoglobin 12.8; Platelets 178 12/07/2016: ALT 11; BUN 59; Creatinine, Ser 1.60; Potassium 4.4; Sodium 134   Lipid Panel    Component Value Date/Time   CHOL 197 05/02/2010 2127   TRIG 88 05/02/2010 2127   HDL 59 05/02/2010 2127   CHOLHDL 3.3 Ratio 05/02/2010 2127   VLDL 18 05/02/2010 2127   LDLCALC 120 (H) 05/02/2010 2127    Additional studies/ records that were reviewed today include:  Echocardiogram 12/02/16: Study Conclusions - Left ventricle: The cavity size was normal. There was mild concentric hypertrophy. Systolic function was normal. The estimated ejection fraction was in the range of 60% to 65%. Wall motion was normal; there were no regional wall motion abnormalities. Features are consistent with a  pseudonormal left ventricular filling pattern, with concomitant abnormal relaxation and increased filling pressure (grade 2 diastolic dysfunction). Doppler parameters are consistent with high ventricular filling pressure. - Mitral valve: There was trivial regurgitation. - Pulmonic valve: There was trivial regurgitation.   Rt heart cath 12/05/16 Fick Cardiac Output 5.6 L/min  Fick Cardiac Output Index 2.72 (L/min)/BSA  RA A Wave 11 mmHg  RA V  Wave 6 mmHg  RA Mean 6 mmHg  RV Systolic Pressure 48 mmHg  RV Diastolic Pressure 0 mmHg  RV EDP 7 mmHg  PA Systolic Pressure 45 mmHg  PA Diastolic Pressure 14 mmHg  PA Mean 23 mmHg  PW A Wave 12 mmHg  PW V Wave 13 mmHg  PW Mean 8 mmHg  QP/QS 1  TPVR Index 8.46 HRUI      ASSESSMENT & PLAN:   Chronic diastolic CHF: appears euvolemic. Weight is stable on her home scales and she is weighing daily. She has cut way back on salt. Continue 40mg  daily with extra PRN for weight gain. Will check a BMET today.    Morbid obesity: Body mass index is 46.99 kg/m. Diet and exercise is essential to her long term health.  CKD: baseline (1.4-1.6). Check a BMET today   Hypertensive heart disease with heart failure: BP well controlled today.   Sinus bradycardia: HR stable today. HR 59.   Possible sleep apnea: she does snore. Will set up a sleep study.   Medication Adjustments/Labs and Tests Ordered: Current medicines are reviewed at length with the patient today.  Concerns regarding medicines are outlined above.  Medication changes, Labs and Tests ordered today are listed in the Patient Instructions below. Patient Instructions  Medication Instructions:  Your physician recommends that you continue on your current medications as directed. Please refer to the Current Medication list given to you today.   Labwork: TODAY:  BMET  Testing/Procedures: Your physician has recommended that you have a sleep study. This test records several body  functions during sleep, including: brain activity, eye movement, oxygen and carbon dioxide blood levels, heart rate and rhythm, breathing rate and rhythm, the flow of air through your mouth and nose, snoring, body muscle movements, and chest and belly movement.   Follow-Up: Your physician recommends that you schedule a follow-up appointment in: SEE DR. NISHAN AS PLANNED  Any Other Special Instructions Will Be Listed Below (If Applicable). Sleep Studies A sleep study (polysomnogram) is a series of tests done while you are sleeping. It can show how well you sleep. This can help your health care provider diagnose a sleep disorder and show how severe your sleep disorder is. A sleep study may lead to treatment that will help you sleep better and prevent other medical problems caused by poor sleep. If you have a sleep disorder, you may also be at risk for:  Sleep-related accidents.  High blood pressure.  Heart disease.  Stroke.  Other medical conditions. Sleep disorders are common. Your health care provider may suspect a sleep disorder if you:  Have loud snoring most nights.  Have brief periods when you stop breathing at night.  Feel sleepy on most days.  Fall asleep suddenly during the day.  Have trouble falling asleep or staying asleep.  Feel like you need to move your legs when trying to fall asleep.  Have dreams that seem very real shortly after falling asleep.  Feel like you cannot move when you first wake up. Which tests will I need to have? Most sleep studies last all night and include these tests:  Recordings of your brain activity.  Recordings of your eye movements.  Recording of your heart rate and rhythm.  Blood pressure readings.  Readings of the amount of oxygen in your blood.  Measurements of your chest and belly movement as you breathe during sleep. If you have signs of the sleep disorder called sleep apnea during your test, you  may get a mask to wear for  the second half of the night.  The mask provides continuous positive airway pressure (CPAP). This may improve sleep apnea significantly.  You will then have all tests done again with the mask in place to see if your measurements and recordings change. How are sleep studies done? Most sleep studies are done over one full night of sleep.  You will arrive at the study center in the evening and can go home in the morning.  Bring your pajamas and toothbrush.  Do not have caffeine on the day of your sleep study.  Your health care provider will let you know if you need to stop taking any of your regular medicines before the test. To do the tests included in a polysomnogram, you will have:  Round, sticky patches with sensors attached to recording wires (electrodes) placed on your scalp, face, chest, and limbs.  Wires from all the electrodes and sensors run from your bed to a computer. The wires can be taken off and put back on if you need to get out of bed to go to the bathroom.  A sensor placed over your nose to measure airflow.  A finger clip put on one finger to measure your blood oxygen level.  A belt around your belly and a belt around your chest to measure breathing movements. Where are sleep studies done? Sleep studies are done at sleep centers. A sleep center may be inside a hospital, office, or clinic. The room where you have the study may look like a hospital room or a hotel room. The health care providers doing the study may come in and out of the room during the study. Most of the time, they will be in another room monitoring your test. How is information from sleep studies helpful? A polysomnogram can be used along with your medical history and a physical exam to diagnose conditions, such as:  Sleep apnea.  Restless legs syndrome.  Sleep-related seizure disorders.  Sleep-related movement disorders. A medical doctor who specializes in sleep will evaluate your sleep study.  The specialist will share the results with your primary health care provider. Treatments based on your sleep study may include:  Improving your sleep habits (sleep hygiene).  Wearing a CPAP mask.  Wearing an oral device at night to improve breathing and reduce snoring.  Taking medicine for:  Restless legs syndrome.  Sleep-related seizure disorder.  Sleep-related movement disorder. This information is not intended to replace advice given to you by your health care provider. Make sure you discuss any questions you have with your health care provider. Document Released: 03/11/2003 Document Revised: 04/30/2016 Document Reviewed: 11/10/2013 Elsevier Interactive Patient Education  2017 [employer].    If you need a refill on your cardiac medications before your next appointment, please call your pharmacy.      Signed, Christina Form, PA-C  12/15/2016 1:49 PM    [facility] [address], [place], [place]  [address] Phone: [phone]; Fax: [phone]

## 2016-12-15 ENCOUNTER — Ambulatory Visit (INDEPENDENT_AMBULATORY_CARE_PROVIDER_SITE_OTHER): Payer: Medicaid Other | Admitting: Physician Assistant

## 2016-12-15 ENCOUNTER — Encounter: Payer: Self-pay | Admitting: Physician Assistant

## 2016-12-15 VITALS — BP 110/62 | HR 59 | Ht 61.5 in | Wt 252.8 lb

## 2016-12-15 DIAGNOSIS — N189 Chronic kidney disease, unspecified: Secondary | ICD-10-CM

## 2016-12-15 DIAGNOSIS — R001 Bradycardia, unspecified: Secondary | ICD-10-CM

## 2016-12-15 DIAGNOSIS — R0683 Snoring: Secondary | ICD-10-CM

## 2016-12-15 DIAGNOSIS — I110 Hypertensive heart disease with heart failure: Secondary | ICD-10-CM | POA: Diagnosis not present

## 2016-12-15 DIAGNOSIS — I5032 Chronic diastolic (congestive) heart failure: Secondary | ICD-10-CM

## 2016-12-15 LAB — BASIC METABOLIC PANEL
BUN/Creatinine Ratio: 28 (ref 12–28)
BUN: 38 mg/dL — ABNORMAL HIGH (ref 8–27)
CALCIUM: 9 mg/dL (ref 8.7–10.3)
CO2: 20 mmol/L (ref 18–29)
Chloride: 103 mmol/L (ref 96–106)
Creatinine, Ser: 1.38 mg/dL — ABNORMAL HIGH (ref 0.57–1.00)
GFR calc Af Amer: 48 mL/min/{1.73_m2} — ABNORMAL LOW (ref >59)
GFR calc non Af Amer: 42 mL/min/{1.73_m2} — ABNORMAL LOW (ref >59)
GLUCOSE: 247 mg/dL — AB (ref 65–99)
POTASSIUM: 5 mmol/L (ref 3.5–5.2)
Sodium: 141 mmol/L (ref 134–144)

## 2016-12-15 MED ORDER — FUROSEMIDE 20 MG PO TABS: 40.0000 mg | ORAL_TABLET | Freq: Every day | ORAL | 0 refills | Status: DC

## 2016-12-15 MED ORDER — FUROSEMIDE 20 MG PO TABS: 40.0000 mg | ORAL_TABLET | Freq: Every day | ORAL | 3 refills | Status: DC

## 2016-12-15 NOTE — Patient Instructions (Addendum)
Medication Instructions:  Your physician recommends that you continue on your current medications as directed. Please refer to the Current Medication list given to you today.   Labwork: TODAY:  BMET  Testing/Procedures: Your physician has recommended that you have a sleep study. This test records several body functions during sleep, including: brain activity, eye movement, oxygen and carbon dioxide blood levels, heart rate and rhythm, breathing rate and rhythm, the flow of air through your mouth and nose, snoring, body muscle movements, and chest and belly movement.   Follow-Up: Your physician recommends that you schedule a follow-up appointment in: SEE DR. NISHAN AS PLANNED  Any Other Special Instructions Will Be Listed Below (If Applicable). Sleep Studies A sleep study (polysomnogram) is a series of tests done while you are sleeping. It can show how well you sleep. This can help your health care provider diagnose a sleep disorder and show how severe your sleep disorder is. A sleep study may lead to treatment that will help you sleep better and prevent other medical problems caused by poor sleep. If you have a sleep disorder, you may also be at risk for:  Sleep-related accidents.  High blood pressure.  Heart disease.  Stroke.  Other medical conditions. Sleep disorders are common. Your health care provider may suspect a sleep disorder if you:  Have loud snoring most nights.  Have brief periods when you stop breathing at night.  Feel sleepy on most days.  Fall asleep suddenly during the day.  Have trouble falling asleep or staying asleep.  Feel like you need to move your legs when trying to fall asleep.  Have dreams that seem very real shortly after falling asleep.  Feel like you cannot move when you first wake up. Which tests will I need to have? Most sleep studies last all night and include these tests:  Recordings of your brain activity.  Recordings of your eye  movements.  Recording of your heart rate and rhythm.  Blood pressure readings.  Readings of the amount of oxygen in your blood.  Measurements of your chest and belly movement as you breathe during sleep. If you have signs of the sleep disorder called sleep apnea during your test, you may get a mask to wear for the second half of the night.  The mask provides continuous positive airway pressure (CPAP). This may improve sleep apnea significantly.  You will then have all tests done again with the mask in place to see if your measurements and recordings change. How are sleep studies done? Most sleep studies are done over one full night of sleep.  You will arrive at the study center in the evening and can go home in the morning.  Bring your pajamas and toothbrush.  Do not have caffeine on the day of your sleep study.  Your health care provider will let you know if you need to stop taking any of your regular medicines before the test. To do the tests included in a polysomnogram, you will have:  Round, sticky patches with sensors attached to recording wires (electrodes) placed on your scalp, face, chest, and limbs.  Wires from all the electrodes and sensors run from your bed to a computer. The wires can be taken off and put back on if you need to get out of bed to go to the bathroom.  A sensor placed over your nose to measure airflow.  A finger clip put on one finger to measure your blood oxygen level.  A belt around  your belly and a belt around your chest to measure breathing movements. Where are sleep studies done? Sleep studies are done at sleep centers. A sleep center may be inside a hospital, office, or clinic. The room where you have the study may look like a hospital room or a hotel room. The health care providers doing the study may come in and out of the room during the study. Most of the time, they will be in another room monitoring your test. How is information from sleep  studies helpful? A polysomnogram can be used along with your medical history and a physical exam to diagnose conditions, such as:  Sleep apnea.  Restless legs syndrome.  Sleep-related seizure disorders.  Sleep-related movement disorders. A medical doctor who specializes in sleep will evaluate your sleep study. The specialist will share the results with your primary health care provider. Treatments based on your sleep study may include:  Improving your sleep habits (sleep hygiene).  Wearing a CPAP mask.  Wearing an oral device at night to improve breathing and reduce snoring.  Taking medicine for:  Restless legs syndrome.  Sleep-related seizure disorder.  Sleep-related movement disorder. This information is not intended to replace advice given to you by your health care provider. Make sure you discuss any questions you have with your health care provider. Document Released: 03/11/2003 Document Revised: 04/30/2016 Document Reviewed: 11/10/2013 Elsevier Interactive Patient Education  2017 [employer].    If you need a refill on your cardiac medications before your next appointment, please call your pharmacy.

## 2016-12-18 ENCOUNTER — Telehealth: Payer: Self-pay | Admitting: *Deleted

## 2016-12-18 NOTE — Telephone Encounter (Signed)
Patient notified of sleep study 

## 2016-12-29 ENCOUNTER — Encounter: Payer: Self-pay | Admitting: Cardiovascular Disease

## 2017-01-05 ENCOUNTER — Telehealth: Payer: Self-pay | Admitting: Cardiovascular Disease

## 2017-01-05 NOTE — Telephone Encounter (Signed)
[name] with [name] is calling on behalf of patient....  Pt c/o Shortness Of Breath: STAT if SOB developed within the last 24 hours or pt is noticeably SOB on the phone  1. Are you currently SOB (can you hear that pt is SOB on the phone)? no  2. How long have you been experiencing SOB? Since wednesday  3. Are you SOB when sitting or when up moving around? Increased all the time  4. Are you currently experiencing any other symptoms? Swelling in both legs & abdomen, wheezing yesterday and the day before.  Donita states that patient BP was 168/72 and that her lungs sound clear. Patient has also had an increase in weight, yesterday she was 254 lbs and today she is 257lbs. Thanks.

## 2017-01-05 NOTE — Telephone Encounter (Signed)
Received call Donita with [facility].Stated she is at patient's home.Patient has increased sob and lower ext edema.Patient has gained 3 lbs in 3 days.Advised ok to double lasix dose (80 mg daily) for next 3 days then return to normal dose 40 mg daily.Check bmet Thurs 01/11/17 and call office results.

## 2017-01-11 ENCOUNTER — Telehealth: Payer: Self-pay | Admitting: Cardiovascular Disease

## 2017-01-11 NOTE — Telephone Encounter (Signed)
Sharee Pimple with [facility] is returning call about patient's BMET. Will have patient get lab work on office visit next week.  Sharee Pimple wanted parameters for patient's fluid intake. Per Dr. Johnsie Cancel patient can drink 1 liter a day. Sharee Pimple agreed to plan and had no other questions at this time.

## 2017-01-11 NOTE — Telephone Encounter (Signed)
[name] ( [place] ) wants to know if [name] is on any restrictions on drinking fluids  . Cause when taking the blood it is thick and not flowing . Please call

## 2017-01-11 NOTE — Telephone Encounter (Signed)
Left message for Sharee Pimple with home health to call back.

## 2017-01-15 IMAGING — CR DG CHEST 2V
2 series · 2 of 2 positions shown · non-contrast
Comparison: PA and lateral chest x-ray October 08, 2015

CLINICAL DATA: Shortness of breath for the past 5 weeks with
increased symptoms over the past 2 weeks associated with mid chest
tightness and cough and peripheral edema. History of asthma,
diabetes, hypertension, obesity, former smoker.

EXAM:
CHEST  2 VIEW

[x chest ap]
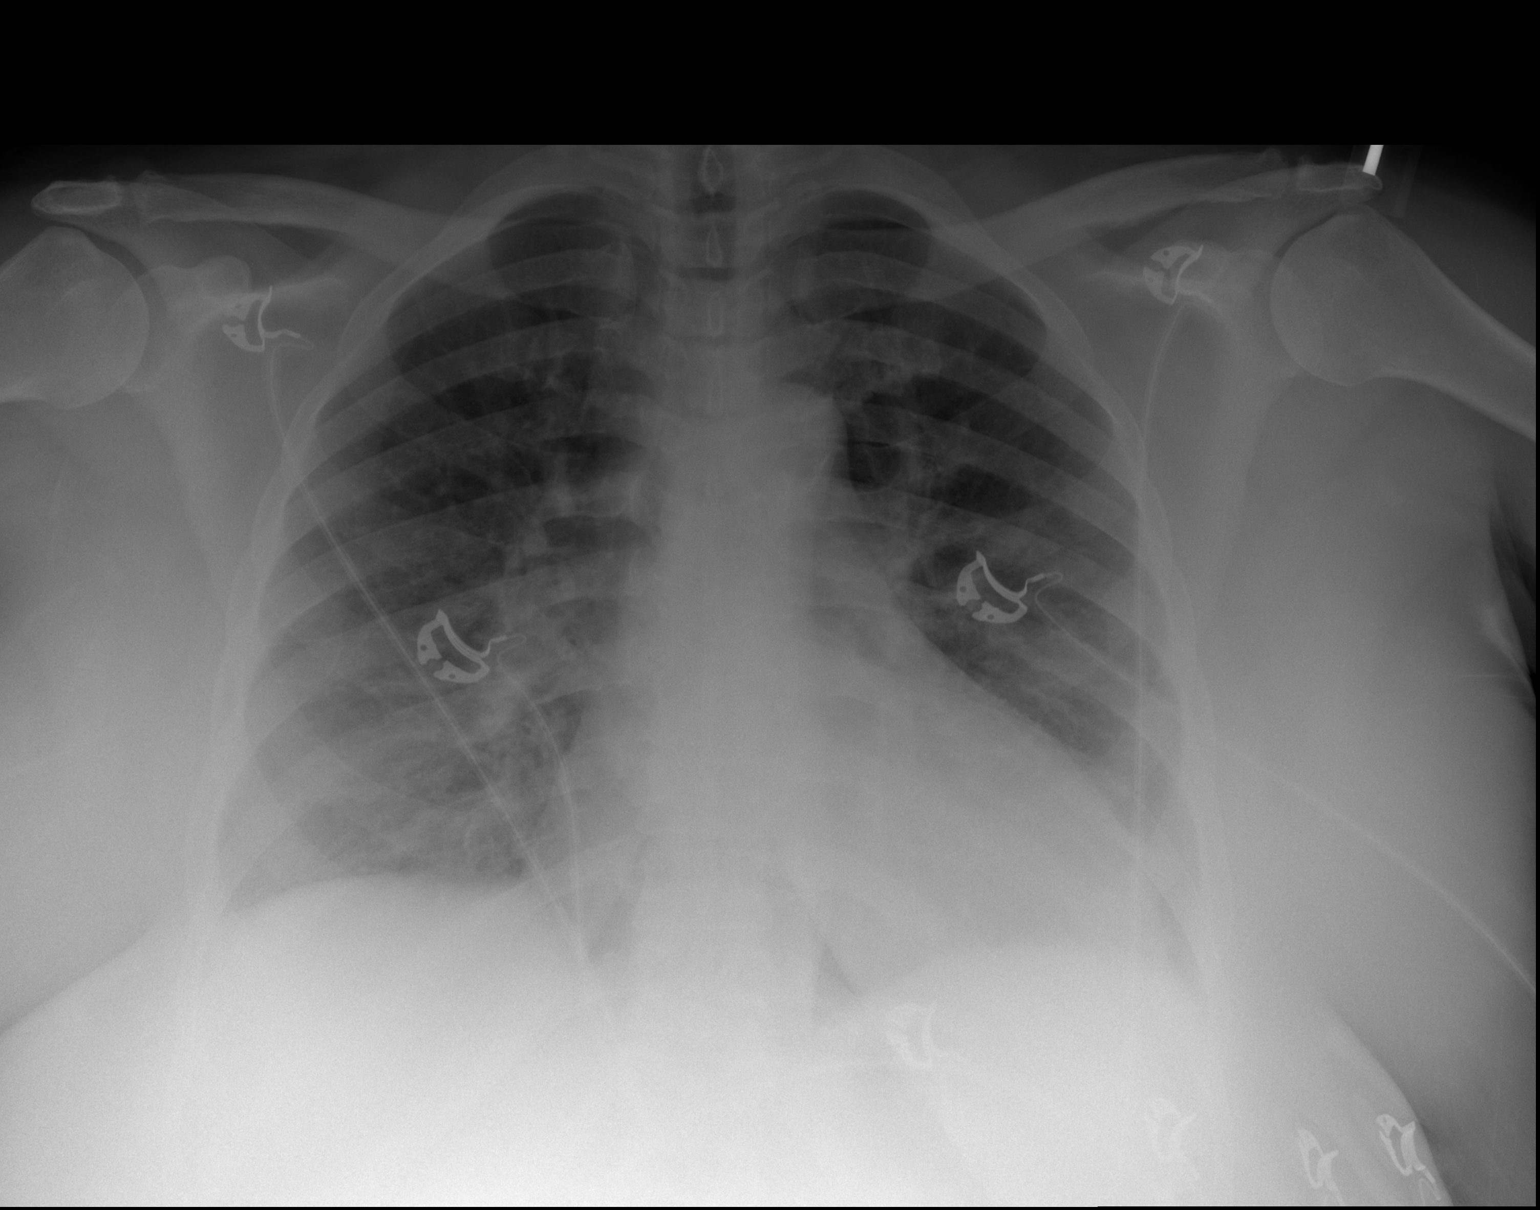

[w chest lat]
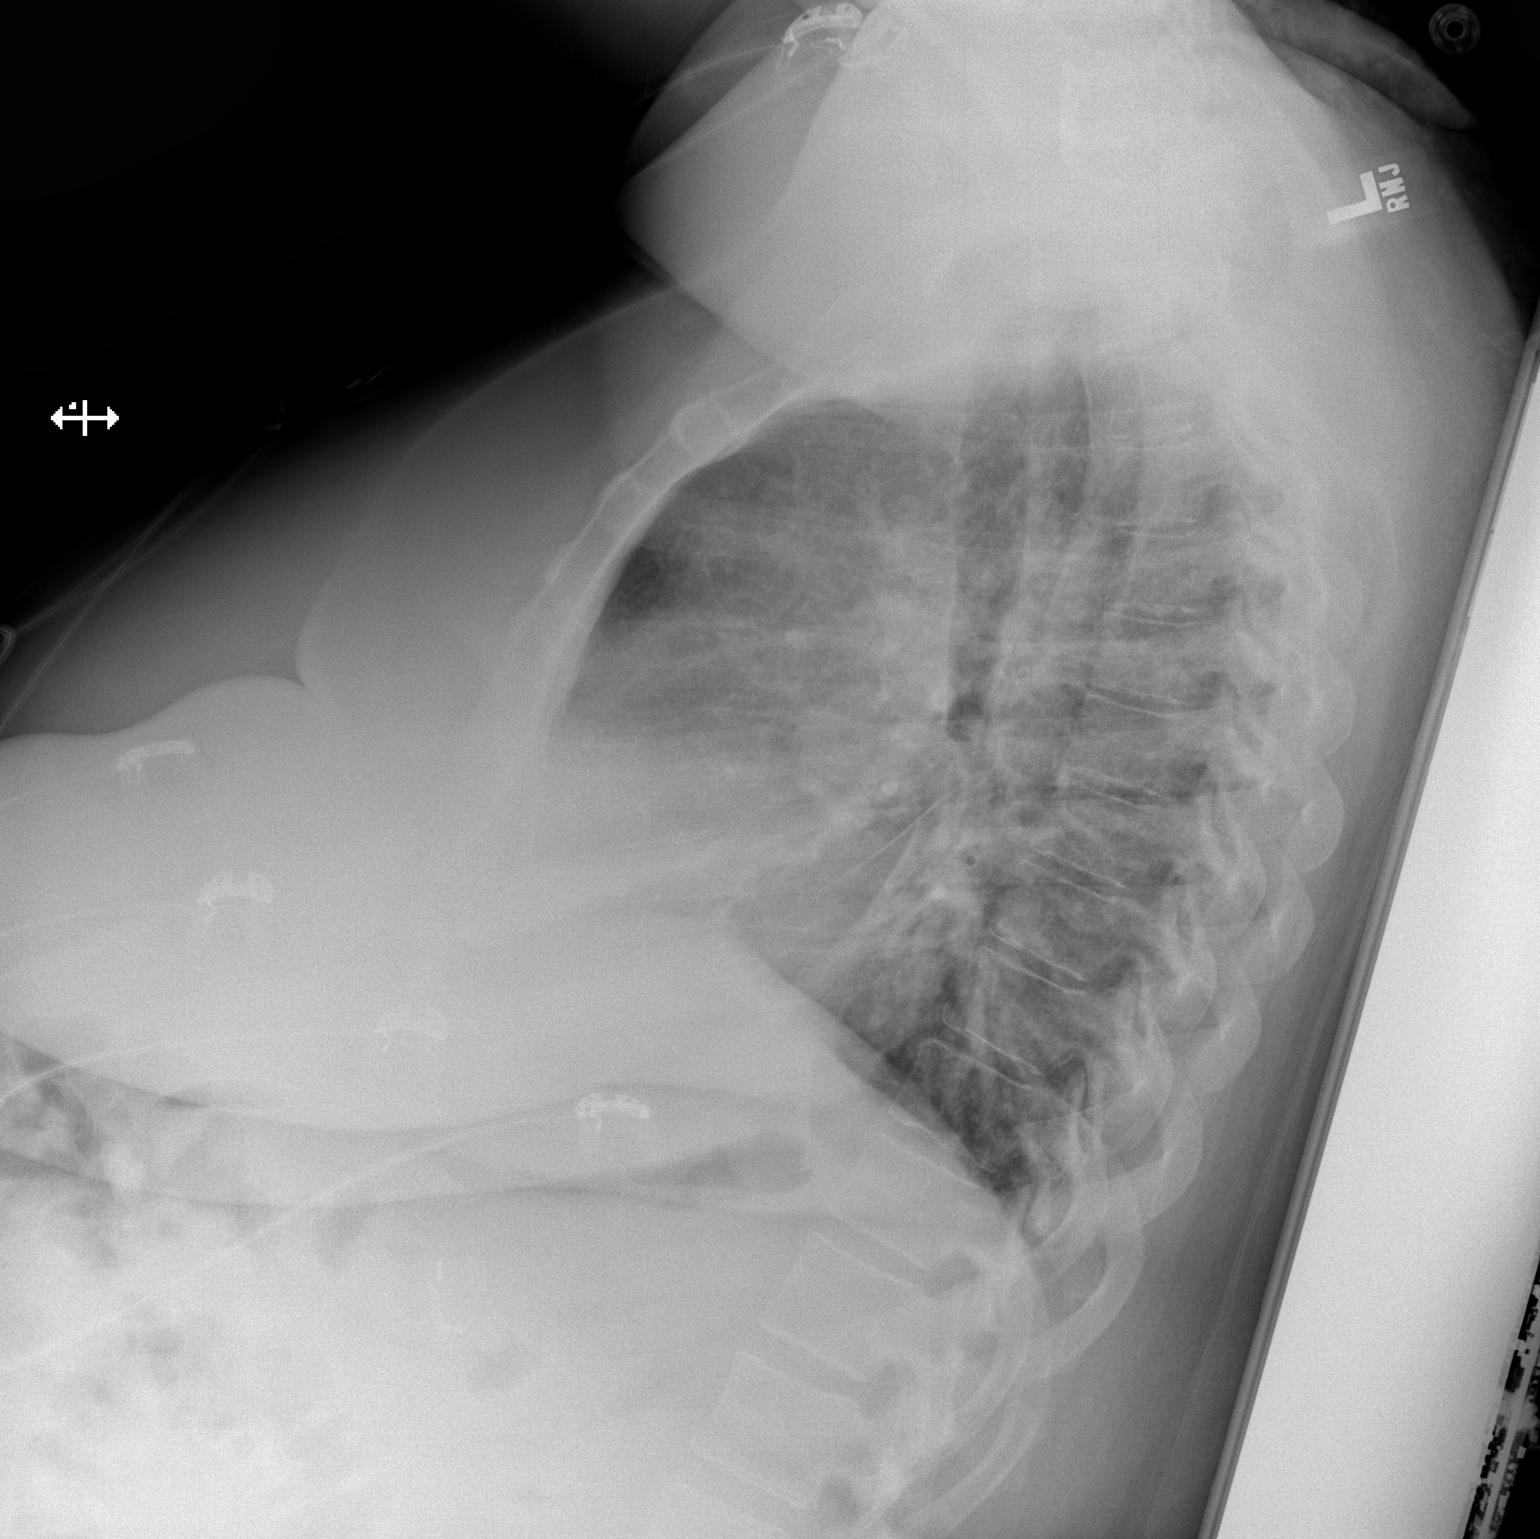

[2 of 2 positions shown; findings below may reference images not displayed]

FINDINGS: The lungs are adequately inflated. There is no focal infiltrate. The
cardiac silhouette is enlarged. The pulmonary vascularity is mildly
engorged. The interstitial markings are slightly increased but this
is not new. There is calcification in the wall of the aortic arch.
There is no pleural effusion. The bony thorax is unremarkable.
IMPRESSION: Cardiomegaly with central pulmonary vascular congestion may reflect
low-grade CHF. There is no alveolar pneumonia.

Thoracic aortic atherosclerosis.

## 2017-01-16 NOTE — Progress Notes (Signed)
Cardiology Office Note    Date:  01/18/2017   ID:  Nohelia Valenza, DOB 11/22/1955, MRN [id number]  PCP:  Javier Docker, MD  Cardiologist:  Dr. Johnsie Cancel   CC: post hospital follow up   History of Present Illness:  [name] is a 61 y.o. female with a history of morbid obesity, OSA, DMT2, HTN, CKD, sinus bradycardia and chronic diastolic CHF who presents to clinic for post hospital follow up I have not seen her since 2014 at that time had atypical Chest pain with normal myovue 03/06/13   Admitted 4/09-7/35/32 for A/C diastolic CHF. Normal LVF with grade 2 DD on echo. Right heart cath revealed normal filling pressures after diuresis. Discharged on Lasix 40 mg daily and potassium 20 once daily. Patient educated on low sodium diet and fluid restriction. Plan for daily weights and to take an additional 40 mg for weight gain of 2-3 pounds. Discharge 246 lbs. BB was discontinued given sinus bradycardia. Creat 1.6 at discharge .  Has clinical sleep apnea.  Has home health nurse visiting her   Weight is up feels bloated doesn't think lasix helping as much Some wheezing sats below 90% with ambulation Has restrictive lung disease uses inhaler and CPAP   Compliant with meds     Past Medical History:  Diagnosis Date  . Achilles rupture   . Anxiety   . Arthritis   . Asthma   . Chronic back pain   . Chronic headaches   . Chronic leg pain    bilaterally  . Depression   . Diabetes mellitus 1999   T2DM  . Glaucoma   . H/O gestational diabetes mellitus, not currently pregnant   . Hyperlipidemia   . Hypertension   . Morbid obesity ([place])   . OSA (obstructive sleep apnea)   . Sciatica     Past Surgical History:  Procedure Laterality Date  . BACK SURGERY    . BREAST LUMPECTOMY     Right breast  . CERVICAL SPINE SURGERY    . CESAREAN SECTION  12/10/89  . CHOLECYSTECTOMY  1991  . LUMBAR SPINE SURGERY    . RIGHT HEART CATH N/A 12/06/2016   Procedure: Right Heart Cath;  Surgeon:  Larey Dresser, MD;  Location: [facility];  Service: Cardiovascular;  Laterality: N/A;  . TUBAL LIGATION      Current Medications: Outpatient Medications Prior to Visit  Medication Sig Dispense Refill  . acetaminophen (TYLENOL) 500 MG tablet Take 1,000 mg by mouth every 6 (six) hours as needed.    Marland Kitchen albuterol (PROVENTIL HFA;VENTOLIN HFA) 108 (90 BASE) MCG/ACT inhaler Inhale 2 puffs into the lungs every 6 (six) hours as needed for wheezing or shortness of breath. For shortness of breath    . amLODipine (NORVASC) 5 MG tablet Take 1 tablet (5 mg total) by mouth daily. 30 tablet 1  . dorzolamide-timolol (COSOPT) 22.3-6.8 MG/ML ophthalmic solution Place 1 drop into both eyes 2 (two) times daily.    Marland Kitchen doxazosin (CARDURA) 4 MG tablet Take 4 mg by mouth at bedtime.    . insulin glargine (LANTUS) 100 unit/mL SOPN Inject 0.45 mLs (45 Units total) into the skin at bedtime. 15 mL   . potassium chloride SA (K-DUR,KLOR-CON) 20 MEQ tablet Take 20 mEq by mouth every other day.   6  . Travoprost, BAK Free, (TRAVATAN) 0.004 % SOLN ophthalmic solution Place 1 drop into both eyes at bedtime.    . furosemide (LASIX) 20 MG tablet Take  2 tablets (40 mg total) by mouth daily. For edema 60 tablet 3   No facility-administered medications prior to visit.      Allergies:   Pork-derived products   Social History   Social History  . Marital status: Single    Spouse name: N/A  . Number of children: N/A  . Years of education: N/A   Social History Main Topics  . Smoking status: Former Smoker    Types: Cigarettes    Quit date: 09/04/1998  . Smokeless tobacco: Never Used  . Alcohol use Yes     Comment: occasional  . Drug use: No  . Sexual activity: Yes    Birth control/ protection: None   Other Topics Concern  . None   Social History Narrative  . None     Family History:  The patient's family history includes Heart failure in her mother; Renal Disease in her mother.      ROS:   Please see  the history of present illness.    ROS All other systems reviewed and are negative.   PHYSICAL EXAM:   VS:  BP (!) 130/58 (BP Location: Left Arm, Patient Position: Sitting, Cuff Size: Normal)   Pulse 60   Ht 5\' 1"  (1.549 m)   Wt 252 lb (114.3 kg)   SpO2 (!) 87% Comment: with activity  BMI 47.61 kg/m    Obese black female Volume overload  HEENT: normal  Neck: no JVD, carotid bruits, or masses Cardiac: RRR; no murmurs, rubs, or gallops,no edema  Respiratory:  clear to auscultation bilaterally, normal work of breathing GI: soft, nontender, nondistended, + BS MS: no deformity or atrophy  Skin: warm and dry, no rash Neuro:  Alert and Oriented x 3, Strength and sensation are intact Psych: euthymic mood, full affect Plus 1-2 LE edema    Wt Readings from Last 3 Encounters:  01/18/17 252 lb (114.3 kg)  12/15/16 252 lb 12.8 oz (114.7 kg)  12/07/16 246 lb 11.2 oz (111.9 kg)      Studies/Labs Reviewed:   EKG:     Not done this visit 12/01/16 SB rate 52 LAD otherwise normal   Recent Labs: 11/30/2016: B Natriuretic Peptide 107.0; TSH 3.247 12/06/2016: Hemoglobin 12.8; Platelets 178 12/07/2016: ALT 11 12/15/2016: BUN 38; Creatinine, Ser 1.38; Potassium 5.0; Sodium 141   Lipid Panel    Component Value Date/Time   CHOL 197 05/02/2010 2127   TRIG 88 05/02/2010 2127   HDL 59 05/02/2010 2127   CHOLHDL 3.3 Ratio 05/02/2010 2127   VLDL 18 05/02/2010 2127   LDLCALC 120 (H) 05/02/2010 2127    Additional studies/ records that were reviewed today include:  Echocardiogram 12/02/16: Study Conclusions - Left ventricle: The cavity size was normal. There was mild concentric hypertrophy. Systolic function was normal. The estimated ejection fraction was in the range of 60% to 65%. Wall motion was normal; there were no regional wall motion abnormalities. Features are consistent with a pseudonormal left ventricular filling pattern, with concomitant abnormal relaxation and increased  filling pressure (grade 2 diastolic dysfunction). Doppler parameters are consistent with high ventricular filling pressure. - Mitral valve: There was trivial regurgitation. - Pulmonic valve: There was trivial regurgitation.   Rt heart cath 12/05/16 Fick Cardiac Output 5.6 L/min  Fick Cardiac Output Index 2.72 (L/min)/BSA  RA A Wave 11 mmHg  RA V Wave 6 mmHg  RA Mean 6 mmHg  RV Systolic Pressure 48 mmHg  RV Diastolic Pressure 0 mmHg  RV EDP 7 mmHg  PA Systolic Pressure 45 mmHg  PA Diastolic Pressure 14 mmHg  PA Mean 23 mmHg  PW A Wave 12 mmHg  PW V Wave 13 mmHg  PW Mean 8 mmHg  QP/QS 1  TPVR Index 8.46 HRUI      ASSESSMENT & PLAN:   Chronic diastolic CHF: weight up feels bloated changed to demedex 20 bid f/u with CHF clinic Check BNP and BMET today   Morbid obesity: Body mass index is 47.61 kg/m. Diet and exercise is essential to her long term health.  CKD: baseline (1.4-1.6). bmet and bnp labs today will need to run pre renal to improved breathing   Hypertensive heart disease with heart failure: BP well controlled today.   Sinus bradycardia: HR stable today. HR 59.   Possible sleep apnea: she does snore.  CPAP refer to pulmonary for restrictive lung disease and OSA    Jenkins Rouge, MD

## 2017-01-18 ENCOUNTER — Ambulatory Visit (INDEPENDENT_AMBULATORY_CARE_PROVIDER_SITE_OTHER): Payer: Medicaid Other | Admitting: Cardiovascular Disease

## 2017-01-18 ENCOUNTER — Encounter: Payer: Self-pay | Admitting: Cardiovascular Disease

## 2017-01-18 VITALS — BP 130/58 | HR 60 | Ht 61.0 in | Wt 252.0 lb

## 2017-01-18 DIAGNOSIS — J449 Chronic obstructive pulmonary disease, unspecified: Secondary | ICD-10-CM | POA: Diagnosis not present

## 2017-01-18 DIAGNOSIS — R0602 Shortness of breath: Secondary | ICD-10-CM | POA: Diagnosis not present

## 2017-01-18 DIAGNOSIS — Z79899 Other long term (current) drug therapy: Secondary | ICD-10-CM

## 2017-01-18 DIAGNOSIS — G4733 Obstructive sleep apnea (adult) (pediatric): Secondary | ICD-10-CM | POA: Diagnosis not present

## 2017-01-18 MED ORDER — TORSEMIDE 20 MG PO TABS: 20.0000 mg | ORAL_TABLET | Freq: Two times a day (BID) | ORAL | 3 refills | Status: DC

## 2017-01-18 NOTE — Patient Instructions (Addendum)
Medication Instructions:  Your physician has recommended you make the following change in your medication:  1-STOP Lasix 2-START Demadex 20 mg by mouth twice daily  Labwork: Your physician recommends that you have a BMET and BNP today  Testing/Procedures: NONE  Follow-Up: You have been referred to Heart Failure clinic in 2 weeks.  You have been referred to pulmonology, they will call you with an appointment.  Your physician wants you to follow-up in: 3 months with Dr. Johnsie Cancel after your appointment with Heart Failure Clinic  If you need a refill on your cardiac medications before your next appointment, please call your pharmacy.

## 2017-01-19 ENCOUNTER — Telehealth: Payer: Self-pay | Admitting: Cardiovascular Disease

## 2017-01-19 LAB — BASIC METABOLIC PANEL
BUN/Creatinine Ratio: 23 (ref 12–28)
BUN: 32 mg/dL — ABNORMAL HIGH (ref 8–27)
CHLORIDE: 102 mmol/L (ref 96–106)
CO2: 24 mmol/L (ref 18–29)
CREATININE: 1.39 mg/dL — AB (ref 0.57–1.00)
Calcium: 9.2 mg/dL (ref 8.7–10.3)
GFR calc Af Amer: 48 mL/min/{1.73_m2} — ABNORMAL LOW (ref >59)
GFR, EST NON AFRICAN AMERICAN: 41 mL/min/{1.73_m2} — AB (ref >59)
Glucose: 72 mg/dL (ref 65–99)
POTASSIUM: 4.3 mmol/L (ref 3.5–5.2)
Sodium: 141 mmol/L (ref 134–144)

## 2017-01-19 LAB — PRO B NATRIURETIC PEPTIDE: NT-PRO BNP: 136 pg/mL (ref 0–287)

## 2017-01-19 NOTE — Telephone Encounter (Signed)
Spoke with pt, the nurse has already left but she was concerned because the patients bp is running 130/54, 140/54 and 130/58. The patient said she was concerned about the bottom number being in the 50's. Patient reports feeling fine. She will continue to monitor and let us know of any concerns.

## 2017-01-19 NOTE — Telephone Encounter (Signed)
Please call,question about her medication.Please call asap,while she is at the pt's house if possible.

## 2017-01-19 NOTE — Progress Notes (Signed)
Left message for patient to call back  

## 2017-01-31 NOTE — Progress Notes (Signed)
Advanced Heart Failure Clinic Note   Referring Physician: Dr. Johnsie Cancel Primary Care: [facility], [place]  Primary Cardiologist: Dr. Johnsie Cancel HF: New  HPI:  [name] is a 61 y.o. female with PMH Chronic diastolic CHF, OSA, Morbid obesity, DM2, HTN, CKD, and sinus bradycardia.   Pt referred from Dr. Johnsie Cancel for evaluation by AHF team.   Most recently admitted 11/2016 with CHF. Echo and RHC as below. Normal LVEF with grade 2 DD. RHC with normal pressures s/p diuresis. Discharge weight 246 lbs.   Seen by Dr. Johnsie Cancel 01/18/2017. Switched from lasix 40 mg daily to torsemide 20 mg BID with bloating and weight gain. Pt weight at that visit 252 lbs ( 6 lbs over 2 months)  Pt presents today to establish in the HF clinic. Weight at home 255. States it went down 2 lbs after switch to torsemide. Before was going up and down, but now stable. Still feels bloated. Remains orthopneic and bendopneic. Significant daytime sleepiness. Goes for sleep study next week. SOB with changing clothes and showering. Waiting for shower seat. Blood glucose 60 this am. States she has episodes episodes of hypoglycemia and then as high as 300.  Very anxious about he sugars and doesn't want to end up back in the hospital. Taking all medications as directed.  Denies lightheadedness or dizziness.   Social: On disability. Had back surgery 08/2014 and has ongoing chronic back pain. Lives at home with roommate. Daughter lives in [place].   Review of systems complete and found to be negative unless listed in HPI.    Echo 12/02/16 LVEF 60-65%, Grade 2 DD, Trivial TR, Trivial PI. Normal RV.   [place] 12/06/16: Hemodynamics (mmHg) RA mean 6 RV 48/7 PA 45/14 (mean 23) PCWP 8 PVR 2.7 WU AO 99% Cardiac Output (Fick) 5.6 Cardiac Index (Fick) 2.72  Past Medical History:  Diagnosis Date  . Achilles rupture   . Anxiety   . Arthritis   . Asthma   . Chronic back pain   . Chronic headaches   . Chronic leg pain    bilaterally  . Depression    . Diabetes mellitus 1999   T2DM  . Glaucoma   . H/O gestational diabetes mellitus, not currently pregnant   . Hyperlipidemia   . Hypertension   . Morbid obesity ([place])   . OSA (obstructive sleep apnea)   . Sciatica     Current Outpatient Prescriptions  Medication Sig Dispense Refill  . acetaminophen (TYLENOL) 500 MG tablet Take 1,000 mg by mouth every 6 (six) hours as needed for mild pain.     Marland Kitchen albuterol (PROVENTIL HFA;VENTOLIN HFA) 108 (90 BASE) MCG/ACT inhaler Inhale 2 puffs into the lungs every 6 (six) hours as needed for wheezing or shortness of breath. For shortness of breath    . amLODipine (NORVASC) 5 MG tablet Take 1 tablet (5 mg total) by mouth daily. 30 tablet 1  . dorzolamide-timolol (COSOPT) 22.3-6.8 MG/ML ophthalmic solution Place 1 drop into both eyes 2 (two) times daily.    Marland Kitchen doxazosin (CARDURA) 4 MG tablet Take 4 mg by mouth at bedtime.    . insulin aspart (NOVOLOG) 100 UNIT/ML injection Inject into the skin 3 (three) times daily before meals. Per sliding scale    . insulin glargine (LANTUS) 100 UNIT/ML injection Inject 50 Units into the skin at bedtime.    . potassium chloride SA (K-DUR,KLOR-CON) 20 MEQ tablet Take 20 mEq by mouth every other day.   6  . torsemide (DEMADEX) 20 MG tablet  Take 3 tablets (60 mg total) by mouth daily. 90 tablet 3  . Travoprost, BAK Free, (TRAVATAN) 0.004 % SOLN ophthalmic solution Place 1 drop into both eyes at bedtime.     No current facility-administered medications for this encounter.     Allergies  Allergen Reactions  . Pork-Derived Products Nausea And Vomiting      Social History   Social History  . Marital status: Single    Spouse name: N/A  . Number of children: N/A  . Years of education: N/A   Occupational History  . Not on file.   Social History Main Topics  . Smoking status: Former Smoker    Types: Cigarettes    Quit date: 09/04/1998  . Smokeless tobacco: Never Used  . Alcohol use Yes     Comment: occasional   . Drug use: No  . Sexual activity: Yes    Birth control/ protection: None   Other Topics Concern  . Not on file   Social History Narrative  . No narrative on file      Family History  Problem Relation Age of Onset  . Heart failure Mother   . Renal Disease Mother     Vitals:   02/01/17 1054  BP: 116/72  Pulse: (!) 55  SpO2: 100%  Weight: 258 lb 2 oz (117.1 kg)  Height: 5\' 1"  (1.549 m)   Wt Readings from Last 3 Encounters:  02/01/17 258 lb 2 oz (117.1 kg)  01/18/17 252 lb (114.3 kg)  12/15/16 252 lb 12.8 oz (114.7 kg)    PHYSICAL EXAM: General:  Obese and fatigued appearing. NAD at rest. Tearful.  HEENT: Normal Neck: supple. no JVD. Carotids 2+ bilat; no bruits. No lymphadenopathy or thyromegaly appreciated. Cor: PMI non-palpable. RRR. No rubs, gallops or murmurs. Lungs: Diminished basilar sounds.  Abdomen: Obese, soft, nontender, distended. No hepatosplenomegaly. No bruits or masses. Good bowel sounds. Extremities: no cyanosis, clubbing, or rash.  Trace L ankle edema. 1-2+ R ankle edema.  Neuro: alert & oriented x 3, cranial nerves grossly intact. moves all 4 extremities w/o difficulty. Affect pleasant.  ASSESSMENT & PLAN:  1. Chronic diastolic CHF - Echo 12/01/15 LVEF 60-65%, Grade 2 DD, Trivial TR, Trivial PI. Normal RV.  - Volume status remains mildly elevated at least on exam.  -Will increase torsemide to 60 mg daily for now. Follow BMET. May need less eventually.  - Suspect her dyspnea is multifactorial. Pts focus currently should be on losing weight and attending sleep study next week.  - Reinforced fluid restriction to < 2 L daily, sodium restriction to less than 2000 mg daily, and the importance of daily weights.   - Will have HF navigator see at next visit for HF educations and CSW see for multiple issues. I have referred her for paramedicine consideration.  2. CKD Stage III - Stable. BMET today with change to torsemide.  3. Morbid obesity - Body mass  index is 48.77 kg/m.  - Needs to limit portions and increase activity as able.  4. HTN - Continue amlodipine 5 mg daily. - Continue cardura 4 mg daily.  5. Sinus bradycardia - Stable. No BB 6. Snoring - Has sleep study scheduled 02/05/17  7. DM2 - Insulin dependent. Recently increased.   Complicated patient. Anxious and has multifactorial dyspnea.  Will engage HF navigator and Social Work. I think she would be a good paramedicine candidate. Labs today, Meds as above. Follow up 2 weeks.   Shirley Friar, PA-C 02/01/17  Greater than 50% of the 25 minute visit was spent in counseling/coordination of care regarding disease state education, sliding scale diuretics, salt/fluid restriction, and involvement of social work/HF navigator.

## 2017-02-01 ENCOUNTER — Encounter (HOSPITAL_COMMUNITY): Payer: Self-pay

## 2017-02-01 ENCOUNTER — Ambulatory Visit (HOSPITAL_COMMUNITY)
Admission: RE | Admit: 2017-02-01 | Discharge: 2017-02-01 | Disposition: A | Discharge status: Home / Self Care | Payer: Medicaid Other | Source: Ambulatory Visit | Attending: Internal Medicine | Admitting: Internal Medicine

## 2017-02-01 VITALS — BP 116/72 | HR 55 | Ht 61.0 in | Wt 258.1 lb

## 2017-02-01 DIAGNOSIS — I10 Essential (primary) hypertension: Secondary | ICD-10-CM | POA: Diagnosis not present

## 2017-02-01 DIAGNOSIS — R001 Bradycardia, unspecified: Secondary | ICD-10-CM | POA: Insufficient documentation

## 2017-02-01 DIAGNOSIS — F419 Anxiety disorder, unspecified: Secondary | ICD-10-CM | POA: Insufficient documentation

## 2017-02-01 DIAGNOSIS — Z6841 Body Mass Index (BMI) 40.0 and over, adult: Secondary | ICD-10-CM | POA: Insufficient documentation

## 2017-02-01 DIAGNOSIS — E785 Hyperlipidemia, unspecified: Secondary | ICD-10-CM | POA: Diagnosis not present

## 2017-02-01 DIAGNOSIS — I130 Hypertensive heart and chronic kidney disease with heart failure and stage 1 through stage 4 chronic kidney disease, or unspecified chronic kidney disease: Secondary | ICD-10-CM | POA: Insufficient documentation

## 2017-02-01 DIAGNOSIS — I5032 Chronic diastolic (congestive) heart failure: Secondary | ICD-10-CM | POA: Diagnosis present

## 2017-02-01 DIAGNOSIS — Z87891 Personal history of nicotine dependence: Secondary | ICD-10-CM | POA: Diagnosis not present

## 2017-02-01 DIAGNOSIS — Z79899 Other long term (current) drug therapy: Secondary | ICD-10-CM | POA: Insufficient documentation

## 2017-02-01 DIAGNOSIS — E1140 Type 2 diabetes mellitus with diabetic neuropathy, unspecified: Secondary | ICD-10-CM

## 2017-02-01 DIAGNOSIS — E1122 Type 2 diabetes mellitus with diabetic chronic kidney disease: Secondary | ICD-10-CM | POA: Diagnosis not present

## 2017-02-01 DIAGNOSIS — Z794 Long term (current) use of insulin: Secondary | ICD-10-CM | POA: Insufficient documentation

## 2017-02-01 DIAGNOSIS — F329 Major depressive disorder, single episode, unspecified: Secondary | ICD-10-CM | POA: Insufficient documentation

## 2017-02-01 DIAGNOSIS — N183 Chronic kidney disease, stage 3 unspecified: Secondary | ICD-10-CM

## 2017-02-01 DIAGNOSIS — R0683 Snoring: Secondary | ICD-10-CM | POA: Insufficient documentation

## 2017-02-01 DIAGNOSIS — G4733 Obstructive sleep apnea (adult) (pediatric): Secondary | ICD-10-CM | POA: Insufficient documentation

## 2017-02-01 LAB — BASIC METABOLIC PANEL
Anion gap: 11 (ref 5–15)
BUN: 46 mg/dL — ABNORMAL HIGH (ref 6–20)
CALCIUM: 8.7 mg/dL — AB (ref 8.9–10.3)
CHLORIDE: 104 mmol/L (ref 101–111)
CO2: 23 mmol/L (ref 22–32)
Creatinine, Ser: 1.82 mg/dL — ABNORMAL HIGH (ref 0.44–1.00)
GFR calc non Af Amer: 29 mL/min — ABNORMAL LOW (ref >60)
GFR, EST AFRICAN AMERICAN: 34 mL/min — AB (ref >60)
Glucose, Bld: 191 mg/dL — ABNORMAL HIGH (ref 65–99)
Potassium: 4.5 mmol/L (ref 3.5–5.1)
SODIUM: 138 mmol/L (ref 135–145)

## 2017-02-01 LAB — BRAIN NATRIURETIC PEPTIDE: B NATRIURETIC PEPTIDE 5: 28.4 pg/mL (ref 0.0–100.0)

## 2017-02-01 MED ORDER — TORSEMIDE 20 MG PO TABS: 60.0000 mg | ORAL_TABLET | Freq: Every day | ORAL | 3 refills | Status: DC

## 2017-02-01 NOTE — Patient Instructions (Signed)
INCREASE Torsemide to 60 mg (3 tabs) daily   Labs today We will only contact you if something comes back abnormal or we need to make some changes. Otherwise no news is good news!   Your physician recommends that you schedule a follow-up appointment in: 2 weeks with Rebecca Eaton   Do the following things EVERYDAY: 1) Weigh yourself in the morning before breakfast. Write it down and keep it in a log. 2) Take your medicines as prescribed 3) Eat low salt foods-Limit salt (sodium) to 2000 mg per day.  4) Stay as active as you can everyday 5) Limit all fluids for the day to less than 2 liters

## 2017-02-05 ENCOUNTER — Ambulatory Visit (HOSPITAL_BASED_OUTPATIENT_CLINIC_OR_DEPARTMENT_OTHER): Payer: Medicaid Other | Attending: Physician Assistant

## 2017-02-05 VITALS — Ht 61.75 in | Wt 250.0 lb

## 2017-02-05 DIAGNOSIS — R0683 Snoring: Secondary | ICD-10-CM | POA: Diagnosis present

## 2017-02-05 DIAGNOSIS — G4733 Obstructive sleep apnea (adult) (pediatric): Secondary | ICD-10-CM | POA: Diagnosis not present

## 2017-02-05 DIAGNOSIS — G4736 Sleep related hypoventilation in conditions classified elsewhere: Secondary | ICD-10-CM | POA: Insufficient documentation

## 2017-02-06 ENCOUNTER — Telehealth (HOSPITAL_COMMUNITY): Payer: Self-pay | Admitting: Surgery

## 2017-02-06 NOTE — Telephone Encounter (Signed)
Patient referred to [facility].  I have sent the appropriate paperwork via secure email to the [employer].

## 2017-02-07 ENCOUNTER — Telehealth: Payer: Self-pay | Admitting: Cardiovascular Disease

## 2017-02-07 NOTE — Telephone Encounter (Signed)
New Message:    [name], from [facility] she need re certification orders, so pt can continue to be seen by [facility].She says a verbal order will be fine.,

## 2017-02-07 NOTE — Telephone Encounter (Signed)
Called Donita from [facility], informed her that she needs to get orders for continued care from patient's PCP. Donita verbalized understanding and will call PCP's office.

## 2017-02-11 NOTE — Procedures (Unsigned)
Patient Name: [name], Bost Date: 02/05/2017 Gender: Female D.O.B: 06-Nov-1955 Age (years): 61 Referring Provider: Eileen Stanford Height (inches): 18 Interpreting Physician: Fransico Him MD, ABSM Weight (lbs): 250 RPSGT: Carolin Coy BMI: 46 MRN: [id number] Neck Size: 16.00  CLINICAL INFORMATION Sleep Study Type: Split Night CPAP  Indication for sleep study: Diabetes, Excessive Daytime Sleepiness, Fatigue, Hypertension, Morning Headaches, Obesity, OSA, Snoring  Epworth Sleepiness Score: 23  SLEEP STUDY TECHNIQUE As per the AASM Manual for the Scoring of Sleep and Associated Events v2.3 (April 2016) with a hypopnea requiring 4% desaturations.  The channels recorded and monitored were frontal, central and occipital EEG, electrooculogram (EOG), submentalis EMG (chin), nasal and oral airflow, thoracic and abdominal wall motion, anterior tibialis EMG, snore microphone, electrocardiogram, and pulse oximetry. Continuous positive airway pressure (CPAP) was initiated when the patient met split night criteria and was titrated according to treat sleep-disordered breathing.  MEDICATIONS Medications self-administered by patient taken the night of the study : TYLENOL, COSOPT, CARDURA, LANTUS, TRAVATAN  RESPIRATORY PARAMETERS Diagnostic Total AHI (/hr): 14.0  RDI (/hr):15.0  OA Index (/hr): 0.6  CA Index (/hr): 0.0 REM AHI (/hr): 53.8  NREM AHI (/hr):10.7  Supine AHI (/hr):14.0  Non-supine AHI (/hr)N/A Min O2 Sat (%):69.00  Mean O2 (%): 95.81  Time below 88% (min):7.4      Titration Optimal Pressure (cm):15  AHI at Optimal Pressure (/hr):0.0  Min O2 at Optimal Pressure (%):95.0 Supine % at Optimal (%):100       SLEEP ARCHITECTURE The recording time for the entire night was 373.5 minutes.  During a baseline period of 189.1 minutes, the patient slept for 188.5 minutes in REM and nonREM, yielding a sleep efficiency of 99.7%. Sleep onset after lights out was 0.3  minutes with a REM latency of 89.0 minutes. The patient spent 5.04% of the night in stage N1 sleep, 87.27% in stage N2 sleep, 0.00% in stage N3 and 7.69% in REM.  During the titration period of 176.7 minutes, the patient slept for 172.5 minutes in REM and nonREM, yielding a sleep efficiency of 97.6%. Sleep onset after CPAP initiation was 2.6 minutes with a REM latency of 49.5 minutes. The patient spent 6.96% of the night in stage N1 sleep, 64.64% in stage N2 sleep, 0.00% in stage N3 and 28.41% in REM.  CARDIAC DATA The 2 lead EKG demonstrated sinus rhythm. The mean heart rate was 56.64 beats per minute. Other EKG findings include: PACs.  LEG MOVEMENT DATA The total Periodic Limb Movements of Sleep (PLMS) were 0. The PLMS index was 0.00 .  IMPRESSIONS - Mild obstructive sleep apnea occurred during the diagnostic portion of the study (AHI = 14.0 /hour). An optimal PAP pressure was selected for this patient ( 19 cm of water) - No significant central sleep apnea occurred during the diagnostic portion of the study (CAI = 0.0/hour). - Severe oxygen desaturation was noted during the diagnostic portion of the study (Min O2 = 69.00%). - The patient snored with Moderate snoring volume during the diagnostic portion of the study. - EKG findings include PACs. - Clinically significant periodic limb movements did not occur during sleep.  DIAGNOSIS - Obstructive Sleep Apnea (327.23 [G47.33 ICD-10]) - Nocturnal hypoxemia  RECOMMENDATIONS - Trial of CPAP therapy on 15 cm H2O with a Small size Fisher&Paykel Full Face Mask Simplus mask and heated humidification. - Avoid alcohol, sedatives and other CNS depressants that may worsen sleep apnea and disrupt normal sleep architecture. - Sleep hygiene should be reviewed to assess  factors that may improve sleep quality. - Weight management and regular exercise should be initiated or continued. - Return to Sleep Center for re-evaluation after 10 weeks of  therapy  [Electronically signed] 02/11/2017 08:02 PM  Fransico Him MD, [place], American Board of Sleep Medicine  ELECTRONICALLY SIGNED ON:  02/11/2017, 8:18 PM [place] PH: [phone]   FX: [phone] [place]

## 2017-02-15 ENCOUNTER — Ambulatory Visit (HOSPITAL_COMMUNITY)
Admission: RE | Admit: 2017-02-15 | Discharge: 2017-02-15 | Disposition: A | Discharge status: Home / Self Care | Payer: Medicaid Other | Source: Ambulatory Visit | Attending: Internal Medicine | Admitting: Internal Medicine

## 2017-02-15 ENCOUNTER — Other Ambulatory Visit (HOSPITAL_COMMUNITY): Payer: Self-pay

## 2017-02-15 ENCOUNTER — Telehealth: Payer: Self-pay | Admitting: *Deleted

## 2017-02-15 VITALS — BP 156/78 | HR 73 | Wt 254.4 lb

## 2017-02-15 DIAGNOSIS — E785 Hyperlipidemia, unspecified: Secondary | ICD-10-CM | POA: Insufficient documentation

## 2017-02-15 DIAGNOSIS — N183 Chronic kidney disease, stage 3 unspecified: Secondary | ICD-10-CM

## 2017-02-15 DIAGNOSIS — E1140 Type 2 diabetes mellitus with diabetic neuropathy, unspecified: Secondary | ICD-10-CM

## 2017-02-15 DIAGNOSIS — I130 Hypertensive heart and chronic kidney disease with heart failure and stage 1 through stage 4 chronic kidney disease, or unspecified chronic kidney disease: Secondary | ICD-10-CM | POA: Diagnosis not present

## 2017-02-15 DIAGNOSIS — I5032 Chronic diastolic (congestive) heart failure: Secondary | ICD-10-CM | POA: Insufficient documentation

## 2017-02-15 DIAGNOSIS — Z794 Long term (current) use of insulin: Secondary | ICD-10-CM | POA: Diagnosis not present

## 2017-02-15 DIAGNOSIS — G4733 Obstructive sleep apnea (adult) (pediatric): Secondary | ICD-10-CM | POA: Insufficient documentation

## 2017-02-15 DIAGNOSIS — Z87891 Personal history of nicotine dependence: Secondary | ICD-10-CM | POA: Insufficient documentation

## 2017-02-15 DIAGNOSIS — Z79899 Other long term (current) drug therapy: Secondary | ICD-10-CM | POA: Diagnosis not present

## 2017-02-15 DIAGNOSIS — I10 Essential (primary) hypertension: Secondary | ICD-10-CM

## 2017-02-15 DIAGNOSIS — E1122 Type 2 diabetes mellitus with diabetic chronic kidney disease: Secondary | ICD-10-CM | POA: Diagnosis not present

## 2017-02-15 DIAGNOSIS — J45909 Unspecified asthma, uncomplicated: Secondary | ICD-10-CM | POA: Insufficient documentation

## 2017-02-15 DIAGNOSIS — R001 Bradycardia, unspecified: Secondary | ICD-10-CM | POA: Diagnosis not present

## 2017-02-15 DIAGNOSIS — Z6841 Body Mass Index (BMI) 40.0 and over, adult: Secondary | ICD-10-CM | POA: Insufficient documentation

## 2017-02-15 DIAGNOSIS — R0683 Snoring: Secondary | ICD-10-CM | POA: Diagnosis not present

## 2017-02-15 LAB — BASIC METABOLIC PANEL
Anion gap: 8 (ref 5–15)
BUN: 37 mg/dL — ABNORMAL HIGH (ref 6–20)
CALCIUM: 9.1 mg/dL (ref 8.9–10.3)
CO2: 28 mmol/L (ref 22–32)
CREATININE: 1.62 mg/dL — AB (ref 0.44–1.00)
Chloride: 102 mmol/L (ref 101–111)
GFR, EST AFRICAN AMERICAN: 39 mL/min — AB (ref >60)
GFR, EST NON AFRICAN AMERICAN: 33 mL/min — AB (ref >60)
Glucose, Bld: 224 mg/dL — ABNORMAL HIGH (ref 65–99)
Potassium: 4.3 mmol/L (ref 3.5–5.1)
SODIUM: 138 mmol/L (ref 135–145)

## 2017-02-15 MED ORDER — AMLODIPINE BESYLATE 5 MG PO TABS: 5.0000 mg | ORAL_TABLET | Freq: Every day | ORAL | 1 refills | Status: DC

## 2017-02-15 NOTE — Telephone Encounter (Signed)
Informed patient of titration results and understanding was verbalized. Patient understands she will be set- up with CPAP. Patient understands she will be contacted by [facility] to set up her cpap. She understands to call if [facility] does not contact her with new setup in a timely manner. She understands she will be called once confirmation has been received from [facility] that she has received her new machine to schedule 10 week follow up appointment.  [place] notified of new cpap order in epic She was grateful for the call and thanked me

## 2017-02-15 NOTE — Patient Instructions (Signed)
Labs today (will call for abnormal results, otherwise no news is good news)  Follow up in 1 Month with Oda Kilts, PA  Follow up in 2 Months with Dr. Aundra Dubin

## 2017-02-15 NOTE — Telephone Encounter (Signed)
LMTCB

## 2017-02-15 NOTE — Telephone Encounter (Signed)
-----   Message from Sueanne Margarita, MD sent at 02/11/2017  8:23 PM EDT ----- Please let patient know that they have significant sleep apnea and had successful CPAP titration and will be set up with CPAP unit.  Please let DME know that order is in EPIC.  Please set patient up for OV in 10 weeks

## 2017-02-15 NOTE — Progress Notes (Signed)
Advanced Heart Failure Clinic Note   Referring Physician: Dr. Johnsie Cancel Primary Care: Delf[facility], [place]  Primary Cardiologist: Dr. Johnsie Cancel HF: New  HPI:  [name] is a 61 y.o. female with PMH Chronic diastolic CHF, OSA, Morbid obesity, DM2, HTN, CKD, and sinus bradycardia.   Pt referred from Dr. Johnsie Cancel for evaluation by AHF team.   Most recently admitted 11/2016 with CHF. Echo and RHC as below. Normal LVEF with grade 2 DD. RHC with normal pressures s/p diuresis. Discharge weight 246 lbs.   Seen by Dr. Johnsie Cancel 01/18/2017. Switched from lasix 40 mg daily to torsemide 20 mg BID with bloating and weight gain. Pt weight at that visit 252 lbs ( 6 lbs over 2 months)  Pt presents today for follow up. At last visit torsemide increased. Weight down 4 lbs from last visit. Has been out of amlodipine for several days. She states she feels about the same. Ankle edema has improved. Still feels bloated in her thighs and abdomen.  Has been having some intermittent nausea and poor appetite. Remains SOB with mild exertion.   Went through sleep study, awaiting results, but took CPAP mask home with her. Taking all medications as directed. Weight at home down to 252 from 255. Occasional lightheadedness with rapid standing, but not marked or limiting. Staying under 2 L of fluid. Appetite poor at times. Trying to watch salt but sometimes slipped up.    Social: On disability. Had back surgery 08/2014 and has ongoing chronic back pain. Lives at home with roommate. Daughter lives in [place].   Review of systems complete and found to be negative unless listed in HPI.    Echo 12/02/16 LVEF 60-65%, Grade 2 DD, Trivial TR, Trivial PI. Normal RV.   [place] 12/06/16: Hemodynamics (mmHg) RA mean 6 RV 48/7 PA 45/14 (mean 23) PCWP 8 PVR 2.7 WU AO 99% Cardiac Output (Fick) 5.6 Cardiac Index (Fick) 2.72  Past Medical History:  Diagnosis Date  . Achilles rupture   . Anxiety   . Arthritis   . Asthma   . Chronic back pain     . Chronic headaches   . Chronic leg pain    bilaterally  . Depression   . Diabetes mellitus 1999   T2DM  . Glaucoma   . H/O gestational diabetes mellitus, not currently pregnant   . Hyperlipidemia   . Hypertension   . Morbid obesity ([place])   . OSA (obstructive sleep apnea)   . Sciatica     Current Outpatient Prescriptions  Medication Sig Dispense Refill  . acetaminophen (TYLENOL) 500 MG tablet Take 1,000 mg by mouth every 6 (six) hours as needed for mild pain.     Marland Kitchen albuterol (PROVENTIL HFA;VENTOLIN HFA) 108 (90 BASE) MCG/ACT inhaler Inhale 2 puffs into the lungs every 6 (six) hours as needed for wheezing or shortness of breath. For shortness of breath    . amLODipine (NORVASC) 5 MG tablet Take 1 tablet (5 mg total) by mouth daily. 30 tablet 1  . dorzolamide-timolol (COSOPT) 22.3-6.8 MG/ML ophthalmic solution Place 1 drop into both eyes 2 (two) times daily.    Marland Kitchen doxazosin (CARDURA) 4 MG tablet Take 4 mg by mouth at bedtime.    . insulin aspart (NOVOLOG) 100 UNIT/ML injection Inject into the skin 3 (three) times daily before meals. Per sliding scale    . insulin glargine (LANTUS) 100 UNIT/ML injection Inject 50 Units into the skin at bedtime.    . potassium chloride SA (K-DUR,KLOR-CON) 20 MEQ tablet Take 20  mEq by mouth every other day.   6  . torsemide (DEMADEX) 20 MG tablet Take 3 tablets (60 mg total) by mouth daily. 90 tablet 3  . Travoprost, BAK Free, (TRAVATAN) 0.004 % SOLN ophthalmic solution Place 1 drop into both eyes at bedtime.     No current facility-administered medications for this encounter.     Allergies  Allergen Reactions  . Pork-Derived Products Nausea And Vomiting     Social History   Social History  . Marital status: Single    Spouse name: N/A  . Number of children: N/A  . Years of education: N/A   Occupational History  . Not on file.   Social History Main Topics  . Smoking status: Former Smoker    Types: Cigarettes    Quit date: 09/04/1998  .  Smokeless tobacco: Never Used  . Alcohol use Yes     Comment: occasional  . Drug use: No  . Sexual activity: Yes    Birth control/ protection: None   Other Topics Concern  . Not on file   Social History Narrative  . No narrative on file      Family History  Problem Relation Age of Onset  . Heart failure Mother   . Renal Disease Mother     Vitals:   02/15/17 1355  BP: (!) 156/78  Pulse: 73  SpO2: 99%  Weight: 254 lb 6.4 oz (115.4 kg)   Wt Readings from Last 3 Encounters:  02/15/17 254 lb 6.4 oz (115.4 kg)  02/05/17 250 lb (113.4 kg)  02/01/17 258 lb 2 oz (117.1 kg)    PHYSICAL EXAM: General: Obese. Well appearing. NAD.  HEENT: Normal Neck: supple. JVP difficult to assess with body habitus.  Appears ~8-9 cm. Carotids 2+ bilat; no bruits. No thyromegaly or nodule noted. Cor: PMI nondisplaced. RRR, No M/G/R noted Lungs: Diminished but clear.  Abdomen: soft, non-tender, distended, no HSM. No bruits or masses. +BS  Extremities: no cyanosis, clubbing, or rash. Trace ankle edema.  Neuro: alert & orientedx3, cranial nerves grossly intact. moves all 4 extremities w/o difficulty. Affect pleasant   ASSESSMENT & PLAN:  1. Chronic diastolic CHF - Echo 7/65/46 LVEF 60-65%, Grade 2 DD, Trivial TR, Trivial PI. Normal RV.  - Volume status looks OK centrally but mild peripheral edema persists - Continue torsemide 60 mg daily for now. Take extra 20 mg torsemide x 2 days.  - Recommend compression hose.  - Suspect her dyspnea is multifactorial, and is out of proportion to her volume status. - Needs to focus on losing weight.  - Reinforced fluid restriction to < 2 L daily, sodium restriction to less than 2000 mg daily, and the importance of daily weights.   2. CKD Stage III - BMET today.  3. Morbid obesity - Body mass index is 46.91 kg/m.  - Needs to limit portions and increase activity as able.  4. HTN - Resume amlodipine 5 mg daily.  - Continue cardura 4 mg daily.  5. Sinus  bradycardia - Stable. No BB.  6. Snoring - Had sleep study 02/05/17. Results pending.  7. DM2 - Insulin dependent. Per PCP.   Remains SOB out of proportion to volume exam. Awaiting Sleep study results. Suspect dyspnea multifactorial.  Has mild peripheral edema. Recommended she take extra 20 mg torsemide x 2 days. Watch fluids and salts.   Continue paramedicine. RTC 4 weeks.  Shirley Friar, PA-C 02/15/17   Greater than 50% of the 25 minute visit was spent  in counseling/coordination of care regarding disease state education, medication reconciliation, discussion with paramedicine, and salt/fluid restriction.

## 2017-02-15 NOTE — Telephone Encounter (Deleted)
-----   Message from Sueanne Margarita, MD sent at 02/11/2017  8:23 PM EDT ----- Please let patient know that they have significant sleep apnea and had successful CPAP titration and will be set up with CPAP unit.  Please let DME know that order is in EPIC.  Please set patient up for OV in 10 weeks

## 2017-02-15 NOTE — Progress Notes (Signed)
Advanced Heart Failure Medication Review by a Pharmacist  Does the patient  feel that his/her medications are working for him/her?  yes  Has the patient been experiencing any side effects to the medications prescribed?  no  Does the patient measure his/her own blood pressure or blood glucose at home?  yes   Does the patient have any problems obtaining medications due to transportation or finances?   no  Understanding of regimen: good Understanding of indications: good Potential of compliance: fair Patient understands to avoid NSAIDs. Patient understands to avoid decongestants.  Issues to address at subsequent visits: None   Pharmacist comments: [name] is a pleasant 61 yo F presenting with [name] (paramedicine) and without her medications. She reports fair compliance with her regimen but does admit to running out of her amlodipine 2 days ago 2/2 running out of refills. She did not have any other medication-related questions or concerns for me at this time.   [name]. [name], PharmD, BCPS, CPP Clinical Pharmacist Pager: [phone] Phone: [phone] 02/15/2017 2:06 PM      Time with patient: 4 minutes Preparation and documentation time: 2 minutes Total time: 6 minutes

## 2017-02-15 NOTE — Progress Notes (Signed)
Paramedicine Encounter   Patient ID: [name] , female,   DOB: 03/12/1956,61 y.o.,  MRN: [id number]   Met patient in clinic today with provider Jonni Sanger.   She down a sleep study and currently waiting for results. Pt is weighing 252 at home.  She stated that she feels very weak after she takes her medications.  She's currently out of amilodipine for about 3 days, she's waiting for the pharmacy to get authorization.  Doroteo Bradford advised pt that she will send in rx if needed.    Jonni Sanger asked pt about using compression hose to assist with the fluid in her legs. She will receive a prescription for the hose.  She reports that she's trying to stay under 2 liters daily.  Pt agrees to work with the [facility] program/ next appointment 02/28/17. *increased to 4 torsemide in the am for 2 days  Time spent with patient 20 mins  Rosaisela Jamroz, EMT-Paramedic 02/15/2017   ACTION: Next visit planned for wed 6/13

## 2017-02-19 ENCOUNTER — Ambulatory Visit (INDEPENDENT_AMBULATORY_CARE_PROVIDER_SITE_OTHER): Payer: Medicaid Other | Admitting: Pulmonary Disease

## 2017-02-19 ENCOUNTER — Encounter: Payer: Self-pay | Admitting: Pulmonary Disease

## 2017-02-19 VITALS — BP 130/64 | HR 73 | Ht 61.0 in | Wt 254.5 lb

## 2017-02-19 DIAGNOSIS — R0602 Shortness of breath: Secondary | ICD-10-CM | POA: Diagnosis not present

## 2017-02-19 LAB — NITRIC OXIDE: Nitric Oxide: 14

## 2017-02-19 NOTE — Progress Notes (Signed)
[name]    [id number]    [birth year]  Primary Care Physician:Pavelock, Ralene Bathe, MD  Referring Physician: Javier Docker, MD [address] [place], [place] [address]  Chief complaint:  Consult for evaluation of dyspnea  HPI: [name] is a 61 year old with past medical history of chronic kidney disease, diastolic heart failure, mild pulmonary hypertension, ex-smoker, morbid obesity she has been referred to the pulmonary clinic for evaluation of dyspnea on exertion for the past several years. She has symptoms at rest and with activity. She denies any cough, sputum production, wheezing. She was admitted in March 2018 with acute on chronic diastolic heart failure. She was diuresed with improvement in symptoms and had a right heart cath which showed normal PA pressures and wedge pressure of 8. She also had a sleep study last month which showed sleep apnea with significant desaturations. CPAP has been ordered but she didn't get it yet.  She has 15-pack-year smoking history and quit in 1999. She has no significant exposures and used to work as a Pharmacist, hospital in [place]  Outpatient Encounter Prescriptions as of 02/19/2017  Medication Sig  . acetaminophen (TYLENOL) 500 MG tablet Take 1,000 mg by mouth every 6 (six) hours as needed for mild pain.   Marland Kitchen albuterol (PROVENTIL HFA;VENTOLIN HFA) 108 (90 BASE) MCG/ACT inhaler Inhale 2 puffs into the lungs every 6 (six) hours as needed for wheezing or shortness of breath. For shortness of breath  . amLODipine (NORVASC) 5 MG tablet Take 1 tablet (5 mg total) by mouth daily.  . dorzolamide-timolol (COSOPT) 22.3-6.8 MG/ML ophthalmic solution Place 1 drop into both eyes 2 (two) times daily.  Marland Kitchen doxazosin (CARDURA) 4 MG tablet Take 4 mg by mouth at bedtime.  . insulin aspart (NOVOLOG) 100 UNIT/ML injection Inject into the skin 3 (three) times daily before meals. Per sliding scale  . insulin glargine (LANTUS) 100 UNIT/ML injection Inject 50  Units into the skin at bedtime.  . potassium chloride SA (K-DUR,KLOR-CON) 20 MEQ tablet Take 20 mEq by mouth every other day.   . torsemide (DEMADEX) 20 MG tablet Take 3 tablets (60 mg total) by mouth daily.  . Travoprost, BAK Free, (TRAVATAN) 0.004 % SOLN ophthalmic solution Place 1 drop into both eyes at bedtime.   No facility-administered encounter medications on file as of 02/19/2017.     Allergies as of 02/19/2017 - Review Complete 02/19/2017  Allergen Reaction Noted  . Pork-derived products Nausea And Vomiting 12/18/2012    Past Medical History:  Diagnosis Date  . Achilles rupture   . Anxiety   . Arthritis   . Asthma   . Chronic back pain   . Chronic headaches   . Chronic leg pain    bilaterally  . Depression   . Diabetes mellitus 1999   T2DM  . Glaucoma   . H/O gestational diabetes mellitus, not currently pregnant   . Hyperlipidemia   . Hypertension   . Morbid obesity ([place])   . OSA (obstructive sleep apnea)   . Sciatica     Past Surgical History:  Procedure Laterality Date  . BACK SURGERY    . BREAST LUMPECTOMY     Right breast  . CERVICAL SPINE SURGERY    . CESAREAN SECTION  12/10/89  . CHOLECYSTECTOMY  1991  . LUMBAR SPINE SURGERY    . RIGHT HEART CATH N/A 12/06/2016   Procedure: Right Heart Cath;  Surgeon: Larey Dresser, MD;  Location: [facility];  Service: Cardiovascular;  Laterality: N/A;  . TUBAL LIGATION      Family History  Problem Relation Age of Onset  . Heart failure Mother   . Renal Disease Mother     Social History   Social History  . Marital status: Single    Spouse name: N/A  . Number of children: N/A  . Years of education: N/A   Occupational History  . Not on file.   Social History Main Topics  . Smoking status: Former Smoker    Types: Cigarettes    Quit date: 09/04/1998  . Smokeless tobacco: Never Used  . Alcohol use Yes     Comment: occasional  . Drug use: No  . Sexual activity: Yes    Birth control/ protection:  None   Other Topics Concern  . Not on file   Social History Narrative  . No narrative on file    Review of systems: Review of Systems  Constitutional: Negative for fever and chills.  HENT: Negative.   Eyes: Negative for blurred vision.  Respiratory: as per HPI  Cardiovascular: Negative for chest pain and palpitations.  Gastrointestinal: Negative for vomiting, diarrhea, blood per rectum. Genitourinary: Negative for dysuria, urgency, frequency and hematuria.  Musculoskeletal: Negative for myalgias, back pain and joint pain.  Skin: Negative for itching and rash.  Neurological: Negative for dizziness, tremors, focal weakness, seizures and loss of consciousness.  Endo/Heme/Allergies: Negative for environmental allergies.  Psychiatric/Behavioral: Negative for depression, suicidal ideas and hallucinations.  All other systems reviewed and are negative.  Physical Exam: Blood pressure 130/64, pulse 73, height 5\' 1"  (1.549 m), weight 254 lb 8 oz (115.4 kg), SpO2 99 %. Gen:      No acute distress HEENT:  EOMI, sclera anicteric Neck:     No masses; no thyromegaly Lungs:    Clear to auscultation bilaterally; normal respiratory effort CV:         Regular rate and rhythm; no murmurs Abd:      + bowel sounds; soft, non-tender; no palpable masses, no distension Ext:    No edema; adequate peripheral perfusion Skin:      Warm and dry; no rash Neuro: alert and oriented x 3 Psych: normal mood and affect  Data Reviewed: FENO 02/19/17- 14  CT scan 10/17/08-bibasilar atelectasis Chest x-ray 09/04/16-cardiomegaly, pulmonary vascular congestion Chest x-ray 11/30/16-cardiomegaly, pulmonary vascular congestion Have reviewed all images personally.  Echocardiogram 3/70/18 - Left ventricle: The cavity size was normal. There was mild   concentric hypertrophy. Systolic function was normal. The   estimated ejection fraction was in the range of 60% to 65%. Wall   motion was normal; there were no regional  wall motion   abnormalities. Features are consistent with a pseudonormal left   ventricular filling pattern, with concomitant abnormal relaxation   and increased filling pressure (grade 2 diastolic dysfunction).   Doppler parameters are consistent with high ventricular filling   pressure. - Mitral valve: There was trivial regurgitation. - Pulmonic valve: There was trivial regurgitation.  [place] 12/06/16: Hemodynamics (mmHg) RA mean 6 RV 48/7 PA 45/14 (mean 23) PCWP 8 PVR 2.7 WU AO 99% Cardiac Output (Fick) 5.6 Cardiac Index (Fick) 2.72  Sleep study 02/05/17-mild obstructive sleep apnea [AHI 14], significant desaturations to 69%. CPAP titrated to 15.  Assessment:  Evaluation of dyspnea Suspect her symptoms are multifactorial from left heart disease, diastolic heart failure, untreated sleep apnea with nocturnal hypoxia, obesity and deconditioning.  She has a remote history  of asthma but her clinical presentation is not suggestive of this and she has low FENO. She'll need evaluation for COPD given her smoking history. We'll schedule for pulmonary function tests. She should continue on her albuterol rescue inhaler until she can be reevaluated.  OSA CPAP already ordered.  Plan/Recommendations: - PFTs - Albuterol PRN - CPAP for OSA  Marshell Garfinkel MD [facility] Pager [phone] 02/19/2017, 12:23 PM  CC: Pavelock, Ralene Bathe, MD

## 2017-02-19 NOTE — Patient Instructions (Signed)
Continue using her albuterol rescue inhaler We will check pulmonary function tests Return to clinic after PFTs for review

## 2017-02-21 ENCOUNTER — Encounter: Payer: Self-pay | Admitting: Podiatry

## 2017-02-21 ENCOUNTER — Ambulatory Visit (INDEPENDENT_AMBULATORY_CARE_PROVIDER_SITE_OTHER): Payer: Medicaid Other | Admitting: Podiatry

## 2017-02-21 DIAGNOSIS — M79676 Pain in unspecified toe(s): Secondary | ICD-10-CM

## 2017-02-21 DIAGNOSIS — B351 Tinea unguium: Secondary | ICD-10-CM | POA: Diagnosis not present

## 2017-02-21 DIAGNOSIS — E1142 Type 2 diabetes mellitus with diabetic polyneuropathy: Secondary | ICD-10-CM | POA: Diagnosis not present

## 2017-02-21 DIAGNOSIS — I739 Peripheral vascular disease, unspecified: Secondary | ICD-10-CM

## 2017-02-21 DIAGNOSIS — E1151 Type 2 diabetes mellitus with diabetic peripheral angiopathy without gangrene: Secondary | ICD-10-CM | POA: Diagnosis not present

## 2017-02-21 NOTE — Patient Instructions (Signed)

## 2017-02-21 NOTE — Progress Notes (Signed)
   Subjective:    Patient ID: [name], female    DOB: 12-08-1955, 61 y.o.   MRN: [id number]  HPI This patient presents today complaining of inability to trim your toenails. She says the toenails are thick and elongated and also uncomfortable when walking. Patient has ongoing back pain and also was not physically able to reach her feet. She denies any professional care Patient complains of numbness and pain in her feet on off weightbearing  Patient denies history of foot ulceration, claudication or amputation Patient is a former smoker discontinue in 1999 Patient is currently disabled  Review of Systems   Positive for edema associated with cardiac and renal disease under medical management Positive for back pain Positive for tendo Achilles rupture (approximately 2008) causing difficulty during walking Positive for numbness and painful feet Positive for fluid retention     Objective:   Physical Exam  Orientated 3  Peripheral edema bilaterally DP pulses 2/4 bilaterally PT pulse 0/4 right and 1/4 left Capillary reflex immediate bilaterally  Neurological: Sensation to 10 g monofilament wire intact 5/5 bilaterally Ankle reflexes nonreactive right reactive left Vibratory sensation non-reactive bilaterally  Dermatological: No open skin lesions bilaterally Dry skin bilaterally Absent hair growth bilaterally Toenails elongated, brittle don't deform 6-10  Musculoskeletal: Pes planus bilaterally Hammertoe fourth right and fifth left Manual motor testing dorsi flexion, 4/5 right and 5/5 left       Assessment & Plan:   Assessment: Diabetic with peripheral vascular disease Peripheral edema bilaterally Diabetic with peripheral neuropathy Mycotic toenails  Plan: Debridement toenails 6-10 mechanical and left without any bleeding  Reappoint 3 months

## 2017-02-26 ENCOUNTER — Ambulatory Visit (INDEPENDENT_AMBULATORY_CARE_PROVIDER_SITE_OTHER): Payer: Medicaid Other | Admitting: Pulmonary Disease

## 2017-02-26 DIAGNOSIS — R0602 Shortness of breath: Secondary | ICD-10-CM | POA: Diagnosis not present

## 2017-02-26 LAB — PULMONARY FUNCTION TEST
DL/VA % pred: 96 %
DL/VA: 4.39 ml/min/mmHg/L
DLCO COR: 13.01 ml/min/mmHg
DLCO UNC % PRED: 53 %
DLCO UNC: 11.63 ml/min/mmHg
DLCO cor % pred: 60 %
FEF 25-75 POST: 1.92 L/s
FEF 25-75 PRE: 2.03 L/s
FEF2575-%Change-Post: -5 %
FEF2575-%PRED-PRE: 105 %
FEF2575-%Pred-Post: 100 %
FEV1-%Change-Post: -2 %
FEV1-%PRED-POST: 76 %
FEV1-%Pred-Pre: 77 %
FEV1-POST: 1.45 L
FEV1-Pre: 1.49 L
FEV1FVC-%Change-Post: 2 %
FEV1FVC-%Pred-Pre: 110 %
FEV6-%CHANGE-POST: -4 %
FEV6-%PRED-PRE: 73 %
FEV6-%Pred-Post: 69 %
FEV6-POST: 1.63 L
FEV6-Pre: 1.71 L
FEV6FVC-%PRED-POST: 104 %
FEV6FVC-%Pred-Pre: 104 %
FVC-%CHANGE-POST: -4 %
FVC-%Pred-Post: 67 %
FVC-%Pred-Pre: 70 %
FVC-Post: 1.63 L
FVC-Pre: 1.71 L
PRE FEV1/FVC RATIO: 87 %
Post FEV1/FVC ratio: 89 %
Post FEV6/FVC ratio: 100 %
Pre FEV6/FVC Ratio: 100 %
RV % pred: 89 %
RV: 1.68 L
TLC % pred: 70 %
TLC: 3.36 L

## 2017-02-26 NOTE — Telephone Encounter (Signed)
F/U Call:  Patient is calling in regards to CPAP machine. Patient states that if you cant reach it is probably due to poor signal so please leave message. Thanks.

## 2017-02-26 NOTE — Progress Notes (Signed)
PFT completed today 02/26/17  

## 2017-02-28 NOTE — Telephone Encounter (Addendum)
Per [facility] Barbaraann Rondo) patient has a very high account balance with [facility] and she can not receive any supplies from them until her account is paid in full. Patient called to speak to Gae Bon (cpap) assistant stating she needs her cpap and supplies and she would like to switch to another DME so she can get her equipment. Informed patient that we utilize 4 other DME's that she can choose from and they are [facility], [facility], [facility] and [facility]. Patient chose [facility] to switch to. Office notes, sleep study, demographics, cpap order faxed to [facility].

## 2017-03-06 ENCOUNTER — Other Ambulatory Visit (HOSPITAL_COMMUNITY): Payer: Self-pay

## 2017-03-06 NOTE — Progress Notes (Signed)
Paramedicine Encounter    Patient ID: [name], female    DOB: 1956/04/27, 61 y.o.   MRN: [id number]    Patient Care Team: Javier Docker, MD as PCP - General (Internal Medicine)  Patient Active Problem List   Diagnosis Date Noted  . Hypertensive heart and renal disease   . SOB (shortness of breath)   . Chronic diastolic heart failure ([place]) 11/30/2016  . CKD (chronic kidney disease) stage 3, GFR 30-59 ml/min 11/30/2016  . Thrombocytopenia ([place]) 11/30/2016  . Sinus bradycardia 11/30/2016  . POSTMENOPAUSAL BLEEDING 05/09/2010  . ANEMIA 05/05/2010  . DEPRESSION 04/26/2010  . TMJ SYNDROME 04/26/2010  . DEGENERATIVE DISC DISEASE, CERVICAL SPINE 04/26/2010  . CHEST PAIN 04/26/2010  . CHRONIC OBSTRUCTIVE PULMONARY DISEASE, MODERATE 03/26/2010  . MASTALGIA 10/08/2009  . Iron deficiency anemia 07/12/2009  . Diabetes mellitus ([place]) 07/02/2009  . Hyperlipidemia 07/02/2009  . Morbid obesity ([place]) 07/02/2009  . OSA (obstructive sleep apnea) 07/02/2009  . Glaucoma 07/02/2009  . Hypertension 07/02/2009  . ASTHMA 07/02/2009  . LOW BACK PAIN SYNDROME 07/02/2009  . BACK PAIN 07/02/2009  . PERIPHERAL EDEMA 07/02/2009  . GROIN PAIN 07/02/2009  . TOBACCO USE, QUIT 07/02/2009  . HEMORRHOIDS, INTERNAL 07/16/2008  . DIVERTICULOSIS OF COLON 07/16/2008    Current Outpatient Prescriptions:  .  acetaminophen (TYLENOL) 500 MG tablet, Take 1,000 mg by mouth every 6 (six) hours as needed for mild pain. , Disp: , Rfl:  .  amLODipine (NORVASC) 5 MG tablet, Take 1 tablet (5 mg total) by mouth daily., Disp: 30 tablet, Rfl: 1 .  doxazosin (CARDURA) 4 MG tablet, Take 4 mg by mouth at bedtime., Disp: , Rfl:  .  potassium chloride SA (K-DUR,KLOR-CON) 20 MEQ tablet, Take 20 mEq by mouth every other day. , Disp: , Rfl: 6 .  torsemide (DEMADEX) 20 MG tablet, Take 3 tablets (60 mg total) by mouth daily., Disp: 90 tablet, Rfl: 3 .  albuterol (PROVENTIL HFA;VENTOLIN HFA) 108 (90 BASE) MCG/ACT inhaler,  Inhale 2 puffs into the lungs every 6 (six) hours as needed for wheezing or shortness of breath. For shortness of breath, Disp: , Rfl:  .  dorzolamide-timolol (COSOPT) 22.3-6.8 MG/ML ophthalmic solution, Place 1 drop into both eyes 2 (two) times daily., Disp: , Rfl:  .  insulin aspart (NOVOLOG) 100 UNIT/ML injection, Inject into the skin 3 (three) times daily before meals. Per sliding scale, Disp: , Rfl:  .  insulin glargine (LANTUS) 100 UNIT/ML injection, Inject 50 Units into the skin at bedtime., Disp: , Rfl:  .  Travoprost, BAK Free, (TRAVATAN) 0.004 % SOLN ophthalmic solution, Place 1 drop into both eyes at bedtime., Disp: , Rfl:  Allergies  Allergen Reactions  . Pork-Derived Products Nausea And Vomiting     Social History   Social History  . Marital status: Single    Spouse name: N/A  . Number of children: N/A  . Years of education: N/A   Occupational History  . Not on file.   Social History Main Topics  . Smoking status: Former Smoker    Types: Cigarettes    Quit date: 09/04/1998  . Smokeless tobacco: Never Used  . Alcohol use Yes     Comment: occasional  . Drug use: No  . Sexual activity: Yes    Birth control/ protection: None   Other Topics Concern  . Not on file   Social History Narrative  . No narrative on file    Physical Exam  Eyes: Pupils are equal, round,  and reactive to light.  Pulmonary/Chest: No respiratory distress. She has no wheezes. She has no rales.  Abdominal: She exhibits distension. There is no tenderness. There is no guarding.  Musculoskeletal: She exhibits edema.  Edema in both feet and lower legs  Skin: Skin is warm and dry. She is not diaphoretic.        Future Appointments Date Time Provider [place]  03/15/2017 2:00 PM [facility] PA/NP [facility] None  04/17/2017 2:40 PM Larey Dresser, MD [facility] None  05/03/2017 10:15 AM Josue Hector, MD [facility]  05/14/2017 3:30 PM Marshell Garfinkel, MD [facility] None   05/29/2017 9:15 AM Tuchman, Leslye Peer, DPM [facility]    ATF pt CAO x 4 sitting in recliner in her living room with no complaints.  Pt stated she her insurance has approved the CPAP machine and she was excited about that.  Pt has her medications on the table inside of a box, she takes her medications directly out of the bottles.  She can not remember if she took her meds this morning.  I encouraged pt to use the pill box and I filled it for her.  She wanted the furosemide in the morning slot due to her taking it around 5am; amlodipine and potassium in the afternoon slot and the rest in evening.  I also encouraged her to set a alarm on her phone to remind her to take her medications.  Pt stated that she is getting very tired throughout the day; she's not having any trouble urinating.   We discussed her diet and fluid intake.  She doesn't remember the heart failure zones therefore we went over them today. Pt stated that she is a really good cook and she doesn't need any low sodium recommendations.  She stated that she has been eating low sodium for "a while now". Pt still agrees to [facility] visits.     BP (!) 144/58 (BP Location: Left Arm, Patient Position: Sitting, Cuff Size: Large)   Resp 16   Wt 255 lb 6.4 oz (115.8 kg)   BMI 48.26 kg/m   cbg 103  Weight yesterday-didn't weigh     Zeva Leber, EMT Paramedic 03/06/2017    ACTION: Home visit completed

## 2017-03-13 ENCOUNTER — Other Ambulatory Visit (HOSPITAL_COMMUNITY): Payer: Self-pay | Admitting: Cardiology

## 2017-03-13 ENCOUNTER — Other Ambulatory Visit (HOSPITAL_COMMUNITY): Payer: Self-pay

## 2017-03-13 NOTE — Progress Notes (Signed)
Paramedicine Encounter    Patient ID: [name], female    DOB: 07-07-1956, 61 y.o.   MRN: [id number]    Patient Care Team: Javier Docker, MD as PCP - General (Internal Medicine)  Patient Active Problem List   Diagnosis Date Noted  . Hypertensive heart and renal disease   . SOB (shortness of breath)   . Chronic diastolic heart failure ([place]) 11/30/2016  . CKD (chronic kidney disease) stage 3, GFR 30-59 ml/min 11/30/2016  . Thrombocytopenia ([place]) 11/30/2016  . Sinus bradycardia 11/30/2016  . POSTMENOPAUSAL BLEEDING 05/09/2010  . ANEMIA 05/05/2010  . DEPRESSION 04/26/2010  . TMJ SYNDROME 04/26/2010  . DEGENERATIVE DISC DISEASE, CERVICAL SPINE 04/26/2010  . CHEST PAIN 04/26/2010  . CHRONIC OBSTRUCTIVE PULMONARY DISEASE, MODERATE 03/26/2010  . MASTALGIA 10/08/2009  . Iron deficiency anemia 07/12/2009  . Diabetes mellitus ([place]) 07/02/2009  . Hyperlipidemia 07/02/2009  . Morbid obesity ([place]) 07/02/2009  . OSA (obstructive sleep apnea) 07/02/2009  . Glaucoma 07/02/2009  . Hypertension 07/02/2009  . ASTHMA 07/02/2009  . LOW BACK PAIN SYNDROME 07/02/2009  . BACK PAIN 07/02/2009  . PERIPHERAL EDEMA 07/02/2009  . GROIN PAIN 07/02/2009  . TOBACCO USE, QUIT 07/02/2009  . HEMORRHOIDS, INTERNAL 07/16/2008  . DIVERTICULOSIS OF COLON 07/16/2008    Current Outpatient Prescriptions:  .  amLODipine (NORVASC) 5 MG tablet, Take 1 tablet (5 mg total) by mouth daily., Disp: 30 tablet, Rfl: 1 .  doxazosin (CARDURA) 4 MG tablet, Take 4 mg by mouth at bedtime., Disp: , Rfl:  .  potassium chloride SA (K-DUR,KLOR-CON) 20 MEQ tablet, Take 20 mEq by mouth every other day. , Disp: , Rfl: 6 .  torsemide (DEMADEX) 20 MG tablet, Take 3 tablets (60 mg total) by mouth daily., Disp: 90 tablet, Rfl: 3 .  acetaminophen (TYLENOL) 500 MG tablet, Take 1,000 mg by mouth every 6 (six) hours as needed for mild pain. , Disp: , Rfl:  .  albuterol (PROVENTIL HFA;VENTOLIN HFA) 108 (90 BASE) MCG/ACT inhaler,  Inhale 2 puffs into the lungs every 6 (six) hours as needed for wheezing or shortness of breath. For shortness of breath, Disp: , Rfl:  .  dorzolamide-timolol (COSOPT) 22.3-6.8 MG/ML ophthalmic solution, Place 1 drop into both eyes 2 (two) times daily., Disp: , Rfl:  .  insulin aspart (NOVOLOG) 100 UNIT/ML injection, Inject into the skin 3 (three) times daily before meals. Per sliding scale, Disp: , Rfl:  .  insulin glargine (LANTUS) 100 UNIT/ML injection, Inject 50 Units into the skin at bedtime., Disp: , Rfl:  .  Travoprost, BAK Free, (TRAVATAN) 0.004 % SOLN ophthalmic solution, Place 1 drop into both eyes at bedtime., Disp: , Rfl:  Allergies  Allergen Reactions  . Pork-Derived Products Nausea And Vomiting     Social History   Social History  . Marital status: Single    Spouse name: N/A  . Number of children: N/A  . Years of education: N/A   Occupational History  . Not on file.   Social History Main Topics  . Smoking status: Former Smoker    Types: Cigarettes    Quit date: 09/04/1998  . Smokeless tobacco: Never Used  . Alcohol use Yes     Comment: occasional  . Drug use: No  . Sexual activity: Yes    Birth control/ protection: None   Other Topics Concern  . Not on file   Social History Narrative  . No narrative on file    Physical Exam  Pulmonary/Chest: No respiratory distress. She  has no wheezes. She has no rales.  Abdominal: She exhibits no distension. There is no tenderness. There is no guarding.  Skin: Skin is warm and dry. She is not diaphoretic.        Future Appointments Date Time Provider [place]  03/22/2017 2:30 PM [facility] PA/NP [facility] None  04/09/2017 10:40 AM Emily Filbert, MD [facility]  04/17/2017 2:40 PM Larey Dresser, MD [facility] None  05/03/2017 10:15 AM Josue Hector, MD [facility]  05/14/2017 3:30 PM Marshell Garfinkel, MD [facility] None  05/29/2017 9:15 AM Tuchman, Leslye Peer, DPM [facility]    ATF pt  CAO x4 sitting in her living room recliner with no complaints.  She stated that she had just woken up and she was currently eating a popsicle.  Pt has taken her medications without difficulty. pt denies sob, dizziness, headache and chest pain.  Pt stated that she can tell that she has gained weight but she doesn't know how.  She stated that she has been eating the "correct foods and hasn't been drinking a lot".  rx bottles verified and pill box refilled.     BP 120/78   Pulse (!) 58   Wt 257 lb 3.2 oz (116.7 kg)   SpO2 99%   BMI 48.60 kg/m   Weight yesterday-257 Last visit weight-255.4  *rx called in: Doxazsin Amlodipine    Addi Pak, EMT Paramedic 03/13/2017    ACTION: Home visit completed

## 2017-03-15 ENCOUNTER — Encounter (HOSPITAL_COMMUNITY): Payer: Medicaid Other

## 2017-03-20 ENCOUNTER — Other Ambulatory Visit (HOSPITAL_COMMUNITY): Payer: Self-pay

## 2017-03-20 NOTE — Progress Notes (Signed)
Paramedicine Encounter    Patient ID: [name], female    DOB: 05/14/1956, 61 y.o.   MRN: [id number]    Patient Care Team: Javier Docker, MD as PCP - General (Internal Medicine)  Patient Active Problem List   Diagnosis Date Noted  . Hypertensive heart and renal disease   . SOB (shortness of breath)   . Chronic diastolic heart failure ([place]) 11/30/2016  . CKD (chronic kidney disease) stage 3, GFR 30-59 ml/min 11/30/2016  . Thrombocytopenia ([place]) 11/30/2016  . Sinus bradycardia 11/30/2016  . POSTMENOPAUSAL BLEEDING 05/09/2010  . ANEMIA 05/05/2010  . DEPRESSION 04/26/2010  . TMJ SYNDROME 04/26/2010  . DEGENERATIVE DISC DISEASE, CERVICAL SPINE 04/26/2010  . CHEST PAIN 04/26/2010  . CHRONIC OBSTRUCTIVE PULMONARY DISEASE, MODERATE 03/26/2010  . MASTALGIA 10/08/2009  . Iron deficiency anemia 07/12/2009  . Diabetes mellitus ([place]) 07/02/2009  . Hyperlipidemia 07/02/2009  . Morbid obesity ([place]) 07/02/2009  . OSA (obstructive sleep apnea) 07/02/2009  . Glaucoma 07/02/2009  . Hypertension 07/02/2009  . ASTHMA 07/02/2009  . LOW BACK PAIN SYNDROME 07/02/2009  . BACK PAIN 07/02/2009  . PERIPHERAL EDEMA 07/02/2009  . GROIN PAIN 07/02/2009  . TOBACCO USE, QUIT 07/02/2009  . HEMORRHOIDS, INTERNAL 07/16/2008  . DIVERTICULOSIS OF COLON 07/16/2008    Current Outpatient Prescriptions:  .  amLODipine (NORVASC) 5 MG tablet, TAKE 1 TABLET BY MOUTH EVERY DAY, Disp: 90 tablet, Rfl: 3 .  doxazosin (CARDURA) 4 MG tablet, Take 4 mg by mouth at bedtime., Disp: , Rfl:  .  potassium chloride SA (K-DUR,KLOR-CON) 20 MEQ tablet, Take 20 mEq by mouth every other day. , Disp: , Rfl: 6 .  torsemide (DEMADEX) 20 MG tablet, Take 3 tablets (60 mg total) by mouth daily., Disp: 90 tablet, Rfl: 3 .  acetaminophen (TYLENOL) 500 MG tablet, Take 1,000 mg by mouth every 6 (six) hours as needed for mild pain. , Disp: , Rfl:  .  albuterol (PROVENTIL HFA;VENTOLIN HFA) 108 (90 BASE) MCG/ACT inhaler, Inhale 2  puffs into the lungs every 6 (six) hours as needed for wheezing or shortness of breath. For shortness of breath, Disp: , Rfl:  .  dorzolamide-timolol (COSOPT) 22.3-6.8 MG/ML ophthalmic solution, Place 1 drop into both eyes 2 (two) times daily., Disp: , Rfl:  .  insulin aspart (NOVOLOG) 100 UNIT/ML injection, Inject into the skin 3 (three) times daily before meals. Per sliding scale, Disp: , Rfl:  .  insulin glargine (LANTUS) 100 UNIT/ML injection, Inject 50 Units into the skin at bedtime., Disp: , Rfl:  .  Travoprost, BAK Free, (TRAVATAN) 0.004 % SOLN ophthalmic solution, Place 1 drop into both eyes at bedtime., Disp: , Rfl:  Allergies  Allergen Reactions  . Pork-Derived Products Nausea And Vomiting     Social History   Social History  . Marital status: Single    Spouse name: N/A  . Number of children: N/A  . Years of education: N/A   Occupational History  . Not on file.   Social History Main Topics  . Smoking status: Former Smoker    Types: Cigarettes    Quit date: 09/04/1998  . Smokeless tobacco: Never Used  . Alcohol use Yes     Comment: occasional  . Drug use: No  . Sexual activity: Yes    Birth control/ protection: None   Other Topics Concern  . Not on file   Social History Narrative  . No narrative on file    Physical Exam  Pulmonary/Chest: No respiratory distress. She has no  wheezes. She has no rales.  Abdominal: She exhibits no distension. There is no tenderness. There is no guarding.  Musculoskeletal: She exhibits no edema.  Skin: Skin is warm and dry. She is not diaphoretic.        Future Appointments Date Time Provider [place]  03/22/2017 2:30 PM [facility] PA/NP [facility] None  04/09/2017 10:40 AM Emily Filbert, MD [facility]  04/17/2017 2:40 PM Larey Dresser, MD [facility] None  05/03/2017 10:15 AM Josue Hector, MD [facility]  05/14/2017 3:30 PM Marshell Garfinkel, MD [facility] None  05/29/2017 9:15 AM Tuchman, Leslye Peer, DPM  [facility]    ATF pt CAO x4 c/o lower back pain.  Pt stated that she saw her primary physician this am and he is worried about her kidney function. Pt stated that she is now worried about her medications and how they are affecting her kidneys. She stated that she ate pizza and hotdogs over the weekend.  Pt stated that her feet feels tighter/ she gained a litter over 2lbs.since our last visit.  She denies sob, dizziness, headache and chest pain today.  She has had to use her inhaler within this past week.  Pt missed the torsemide dose this am because she had an early appointment with her primary and she uses public transportation.  rx bottles verified and pill box refilled.    BP (!) 148/64 (BP Location: Left Arm, Patient Position: Sitting, Cuff Size: Large)   Pulse 60   Resp 12   Wt 259 lb 3.2 oz (117.6 kg)   SpO2 99%   BMI 48.98 kg/m   cbg PTA 135 Weight yesterday-259.2 Last visit weight-257    Ruba Outen, EMT Paramedic 03/20/2017    ACTION: Home visit completed

## 2017-03-22 ENCOUNTER — Other Ambulatory Visit (HOSPITAL_COMMUNITY): Payer: Self-pay

## 2017-03-22 ENCOUNTER — Ambulatory Visit (HOSPITAL_COMMUNITY)
Admission: RE | Admit: 2017-03-22 | Discharge: 2017-03-22 | Disposition: A | Discharge status: Home / Self Care | Payer: Medicaid Other | Source: Ambulatory Visit | Attending: Internal Medicine | Admitting: Internal Medicine

## 2017-03-22 VITALS — BP 124/54 | HR 52 | Wt 263.5 lb

## 2017-03-22 DIAGNOSIS — Z87891 Personal history of nicotine dependence: Secondary | ICD-10-CM | POA: Insufficient documentation

## 2017-03-22 DIAGNOSIS — Z79899 Other long term (current) drug therapy: Secondary | ICD-10-CM | POA: Insufficient documentation

## 2017-03-22 DIAGNOSIS — E785 Hyperlipidemia, unspecified: Secondary | ICD-10-CM | POA: Diagnosis not present

## 2017-03-22 DIAGNOSIS — Z6841 Body Mass Index (BMI) 40.0 and over, adult: Secondary | ICD-10-CM | POA: Diagnosis not present

## 2017-03-22 DIAGNOSIS — H409 Unspecified glaucoma: Secondary | ICD-10-CM | POA: Insufficient documentation

## 2017-03-22 DIAGNOSIS — R001 Bradycardia, unspecified: Secondary | ICD-10-CM | POA: Diagnosis not present

## 2017-03-22 DIAGNOSIS — Z794 Long term (current) use of insulin: Secondary | ICD-10-CM

## 2017-03-22 DIAGNOSIS — Z8249 Family history of ischemic heart disease and other diseases of the circulatory system: Secondary | ICD-10-CM | POA: Insufficient documentation

## 2017-03-22 DIAGNOSIS — N183 Chronic kidney disease, stage 3 unspecified: Secondary | ICD-10-CM

## 2017-03-22 DIAGNOSIS — E1122 Type 2 diabetes mellitus with diabetic chronic kidney disease: Secondary | ICD-10-CM | POA: Insufficient documentation

## 2017-03-22 DIAGNOSIS — Z9111 Patient's noncompliance with dietary regimen: Secondary | ICD-10-CM | POA: Diagnosis not present

## 2017-03-22 DIAGNOSIS — I130 Hypertensive heart and chronic kidney disease with heart failure and stage 1 through stage 4 chronic kidney disease, or unspecified chronic kidney disease: Secondary | ICD-10-CM | POA: Insufficient documentation

## 2017-03-22 DIAGNOSIS — Z841 Family history of disorders of kidney and ureter: Secondary | ICD-10-CM | POA: Insufficient documentation

## 2017-03-22 DIAGNOSIS — G8929 Other chronic pain: Secondary | ICD-10-CM | POA: Insufficient documentation

## 2017-03-22 DIAGNOSIS — M549 Dorsalgia, unspecified: Secondary | ICD-10-CM | POA: Insufficient documentation

## 2017-03-22 DIAGNOSIS — I5032 Chronic diastolic (congestive) heart failure: Secondary | ICD-10-CM

## 2017-03-22 DIAGNOSIS — E1140 Type 2 diabetes mellitus with diabetic neuropathy, unspecified: Secondary | ICD-10-CM

## 2017-03-22 DIAGNOSIS — G4733 Obstructive sleep apnea (adult) (pediatric): Secondary | ICD-10-CM

## 2017-03-22 DIAGNOSIS — I10 Essential (primary) hypertension: Secondary | ICD-10-CM | POA: Diagnosis not present

## 2017-03-22 LAB — BASIC METABOLIC PANEL
Anion gap: 9 (ref 5–15)
BUN: 46 mg/dL — AB (ref 6–20)
CALCIUM: 9 mg/dL (ref 8.9–10.3)
CO2: 25 mmol/L (ref 22–32)
Chloride: 104 mmol/L (ref 101–111)
Creatinine, Ser: 1.58 mg/dL — ABNORMAL HIGH (ref 0.44–1.00)
GFR, EST AFRICAN AMERICAN: 40 mL/min — AB (ref >60)
GFR, EST NON AFRICAN AMERICAN: 35 mL/min — AB (ref >60)
Glucose, Bld: 226 mg/dL — ABNORMAL HIGH (ref 65–99)
Potassium: 4.1 mmol/L (ref 3.5–5.1)
SODIUM: 138 mmol/L (ref 135–145)

## 2017-03-22 MED ORDER — TORSEMIDE 20 MG PO TABS: 60.0000 mg | ORAL_TABLET | Freq: Every morning | ORAL | 6 refills | Status: DC

## 2017-03-22 NOTE — Patient Instructions (Signed)
Routine lab work today. Will notify you of abnormal results, otherwise no news is good news!  Continue taking torsemide 60 mg (3 tabs) every morning. Take EXTRA 40 mg (2 tabs) the next 3 afternoons (today, tomorrow, Saturday).  Follow up 2 months. Take all medication as prescribed the day of your appointment. Bring all medications with you to your appointment.  Do the following things EVERYDAY: 1) Weigh yourself in the morning before breakfast. Write it down and keep it in a log. 2) Take your medicines as prescribed 3) Eat low salt foods-Limit salt (sodium) to 2000 mg per day.  4) Stay as active as you can everyday 5) Limit all fluids for the day to less than 2 liters

## 2017-03-22 NOTE — Progress Notes (Signed)
Paramedicine Encounter   Patient ID: [name] , female,   DOB: 1955-12-20,61 y.o.,  MRN: [id number]   Met patient in clinic today with provider Erin.   Pt is in good spirits today.  She stated that she feels pretty good but she has been eating "very bad".  She admits to getting out a lot and not managing her sodium intake.  [name] stated that pt's weight is up 9 lbs within a few months.  She also explained to the importance of her diet and fluids.  Pt wanted to understand the reason why her primary ordered blood work to check her kidney function/Erin explained to her.    Torsemide increased an extra 40 mg in the pm for today, tomorrow, and Saturday.  Time spent with patient 30 mins  Kendric Sindelar, EMT-Paramedic 03/22/2017   ACTION: Next visit planned for next tuesday

## 2017-03-22 NOTE — Progress Notes (Signed)
Advanced Heart Failure Clinic Note   Referring Physician: Dr. Johnsie Cancel Primary Care: [facility], [place]  Primary Cardiologist: Dr. Johnsie Cancel HF: New  HPI:  [name] is a 61 y.o. female with PMH Chronic diastolic CHF, OSA, Morbid obesity, DM2, HTN, CKD, and sinus bradycardia.   Pt referred from Dr. Johnsie Cancel for evaluation by AHF team.   Admitted 11/2016 with CHF. Echo and RHC as below. Normal LVEF with grade 2 DD. RHC with normal pressures s/p diuresis. Discharge weight 246 lbs.   Seen by Dr. Johnsie Cancel 01/18/2017. Switched from lasix 40 mg daily to torsemide 20 mg BID with bloating and weight gain. Pt weight at that visit 252 lbs ( 6 lbs over 2 months)  Returns today for HF follow up. Weights at home going up. Weight up 9 pounds since last visit, she has been eating out a lot recently. She says she has been on a "binge". Ate out last night at [employer] and had onion rings. Drinking alcohol as well. Taking all medications.  She arrived in wheelchair because she gets SOB when she walks too far.    Social: On disability. Had back surgery 08/2014 and has ongoing chronic back pain. Lives at home with roommate. Daughter lives in [place].   Review of systems complete and found to be negative unless listed in HPI.    Echo 12/02/16 LVEF 60-65%, Grade 2 DD, Trivial TR, Trivial PI. Normal RV.   [place] 12/06/16: Hemodynamics (mmHg) RA mean 6 RV 48/7 PA 45/14 (mean 23) PCWP 8 PVR 2.7 WU AO 99% Cardiac Output (Fick) 5.6 Cardiac Index (Fick) 2.72  Past Medical History:  Diagnosis Date  . Achilles rupture   . Anxiety   . Arthritis   . Asthma   . Chronic back pain   . Chronic headaches   . Chronic leg pain    bilaterally  . Depression   . Diabetes mellitus 1999   T2DM  . Glaucoma   . H/O gestational diabetes mellitus, not currently pregnant   . Hyperlipidemia   . Hypertension   . Morbid obesity ([place])   . OSA (obstructive sleep apnea)   . Sciatica     Current Outpatient Prescriptions    Medication Sig Dispense Refill  . acetaminophen (TYLENOL) 500 MG tablet Take 1,000 mg by mouth every 6 (six) hours as needed for mild pain.     Marland Kitchen acetaminophen-codeine (TYLENOL #3) 300-30 MG tablet Take 1 tablet by mouth every 6 (six) hours as needed for moderate pain.    Marland Kitchen albuterol (PROVENTIL HFA;VENTOLIN HFA) 108 (90 BASE) MCG/ACT inhaler Inhale 2 puffs into the lungs every 6 (six) hours as needed for wheezing or shortness of breath. For shortness of breath    . amLODipine (NORVASC) 5 MG tablet TAKE 1 TABLET BY MOUTH EVERY DAY 90 tablet 3  . dorzolamide-timolol (COSOPT) 22.3-6.8 MG/ML ophthalmic solution Place 1 drop into both eyes 2 (two) times daily.    Marland Kitchen doxazosin (CARDURA) 4 MG tablet Take 4 mg by mouth at bedtime.    . insulin aspart (NOVOLOG) 100 UNIT/ML injection Inject into the skin 3 (three) times daily before meals. Per sliding scale    . insulin glargine (LANTUS) 100 UNIT/ML injection Inject 50 Units into the skin at bedtime.    . potassium chloride SA (K-DUR,KLOR-CON) 20 MEQ tablet Take 20 mEq by mouth every other day.   6  . torsemide (DEMADEX) 20 MG tablet Take 3 tablets (60 mg total) by mouth daily. 90 tablet 3  . Travoprost,  BAK Free, (TRAVATAN) 0.004 % SOLN ophthalmic solution Place 1 drop into both eyes at bedtime.     No current facility-administered medications for this encounter.     Allergies  Allergen Reactions  . Pork-Derived Products Nausea And Vomiting     Social History   Social History  . Marital status: Single    Spouse name: N/A  . Number of children: N/A  . Years of education: N/A   Occupational History  . Not on file.   Social History Main Topics  . Smoking status: Former Smoker    Types: Cigarettes    Quit date: 09/04/1998  . Smokeless tobacco: Never Used  . Alcohol use Yes     Comment: occasional  . Drug use: No  . Sexual activity: Yes    Birth control/ protection: None   Other Topics Concern  . Not on file   Social History  Narrative  . No narrative on file      Family History  Problem Relation Age of Onset  . Heart failure Mother   . Renal Disease Mother     Vitals:   03/22/17 1427  BP: (!) 124/54  Pulse: (!) 52  SpO2: 100%  Weight: 263 lb 8 oz (119.5 kg)   Wt Readings from Last 3 Encounters:  03/22/17 263 lb 8 oz (119.5 kg)  03/20/17 259 lb 3.2 oz (117.6 kg)  03/13/17 257 lb 3.2 oz (116.7 kg)    PHYSICAL EXAM: General:Obese female NAD. Arrived in wheelchair.  HEENT: Normal Neck: supple. Hard to assess JVP with thick neck. Appears elevated. Carotids 2+ bilat; no bruits. No thyromegaly or nodule noted. Cor: PMI nondisplaced. Regular rate and rhythm. No M/R/G Lungs: Distant lung sounds in bilateral lower lobes. Clear in upper lobes bilaterally.  Abdomen: soft, non-tender, distended, no HSM. No bruits or masses. + bowel sounds.  Extremities: no cyanosis, clubbing, or rash. 1+ pedal edema.  Neuro: alert & orientedx3, cranial nerves grossly intact. moves all 4 extremities w/o difficulty. Affect pleasant   ASSESSMENT & PLAN:  1. Chronic diastolic CHF: Echo 04/6760 EF 60-65%, Grade 2 DD, trivial TR, normal RV.  - NYHA III. - Volume status elevated in the setting of dietary noncompliance.  - Take torsemide 60 mg in the am and 40 mg in the pm for the next 2 days. Then continue taking 60mg  daily.  - Take an extra 63mEq of KCl the next 3 days.  - Paramedicine present at visit and went ahead and filled her pill box for the next week with the above changes.  - We talked about dietary restrictions, specifically eating out. Told her to avoid fast food, eating out and high sodium foods. Reinforced fluid restrictions, less than 2L a day.  - Suspect that her dyspnea is multifactorial and is out of proportion to her volume status.   2. CKD Stage III - BMET today.  - She tells me that her PCP is recommending a renal ultrasound.   3. Morbid obesity - Body mass index is 49.79 kg/m. - Encouraged her to  increase her activity.    4. HTN - Well controlled on current regimen.   5. Sinus bradycardia -Stable. Not on beta blocker.   6. OSA - Had sleep study in May 2018 - Has not received CPAP yet. She is going to call [facility] to follow up.   7. DM2 - Insulin dependent.  - Follows with PCP.   Follow up in 2 months.    Dani Gobble  Tamala Julian, NP 03/22/17

## 2017-03-23 ENCOUNTER — Telehealth: Payer: Self-pay | Admitting: Cardiology

## 2017-03-23 NOTE — Telephone Encounter (Signed)
Patient wanted to know when Dr. Radford Pax will be back in the office.  Informed her that Dr. Radford Pax could potentially be in the office next week, but she is scheduled to be in the hospital. She requests a call when paper is signed.

## 2017-03-23 NOTE — Telephone Encounter (Signed)
Per DPR form, left detailed message that Dr. Radford Pax has not been in the office. Informed her that the paperwork has been received and will be signed as soon as she returns. Instructed patient to call if she has further questions or concerns.

## 2017-03-23 NOTE — Telephone Encounter (Signed)
New message    Pt is calling back for [facility]. She states that she has another question after receiving message.

## 2017-03-23 NOTE — Telephone Encounter (Signed)
New message    Pt is calling to find out about a paper that Dr. Radford Pax was supposed to sign for her sleep machine. She is calling to follow up on it. Please call.

## 2017-03-28 ENCOUNTER — Other Ambulatory Visit (HOSPITAL_COMMUNITY): Payer: Self-pay

## 2017-03-28 NOTE — Progress Notes (Signed)
Paramedicine Encounter    Patient ID: [name], female    DOB: 1956-02-26, 61 y.o.   MRN: [id number]    Patient Care Team: Javier Docker, MD as PCP - General (Internal Medicine)  Patient Active Problem List   Diagnosis Date Noted  . Hypertensive heart and renal disease   . SOB (shortness of breath)   . Chronic diastolic heart failure ([place]) 11/30/2016  . CKD (chronic kidney disease) stage 3, GFR 30-59 ml/min 11/30/2016  . Thrombocytopenia ([place]) 11/30/2016  . Sinus bradycardia 11/30/2016  . POSTMENOPAUSAL BLEEDING 05/09/2010  . ANEMIA 05/05/2010  . DEPRESSION 04/26/2010  . TMJ SYNDROME 04/26/2010  . DEGENERATIVE DISC DISEASE, CERVICAL SPINE 04/26/2010  . CHEST PAIN 04/26/2010  . CHRONIC OBSTRUCTIVE PULMONARY DISEASE, MODERATE 03/26/2010  . MASTALGIA 10/08/2009  . Iron deficiency anemia 07/12/2009  . Diabetes mellitus ([place]) 07/02/2009  . Hyperlipidemia 07/02/2009  . Morbid obesity ([place]) 07/02/2009  . OSA (obstructive sleep apnea) 07/02/2009  . Glaucoma 07/02/2009  . Hypertension 07/02/2009  . ASTHMA 07/02/2009  . LOW BACK PAIN SYNDROME 07/02/2009  . BACK PAIN 07/02/2009  . PERIPHERAL EDEMA 07/02/2009  . GROIN PAIN 07/02/2009  . TOBACCO USE, QUIT 07/02/2009  . HEMORRHOIDS, INTERNAL 07/16/2008  . DIVERTICULOSIS OF COLON 07/16/2008    Current Outpatient Prescriptions:  .  amLODipine (NORVASC) 5 MG tablet, TAKE 1 TABLET BY MOUTH EVERY DAY, Disp: 90 tablet, Rfl: 3 .  doxazosin (CARDURA) 4 MG tablet, Take 4 mg by mouth at bedtime., Disp: , Rfl:  .  insulin aspart (NOVOLOG) 100 UNIT/ML injection, Inject into the skin 3 (three) times daily before meals. Per sliding scale, Disp: , Rfl:  .  insulin glargine (LANTUS) 100 UNIT/ML injection, Inject 50 Units into the skin at bedtime., Disp: , Rfl:  .  potassium chloride SA (K-DUR,KLOR-CON) 20 MEQ tablet, Take 20 mEq by mouth every other day. , Disp: , Rfl: 6 .  torsemide (DEMADEX) 20 MG tablet, Take 3 tablets (60 mg total) by  mouth every morning. Take EXTRA 40mg  (2 tabs) in pm on 7/5, 7/6, and 7/7., Disp: 96 tablet, Rfl: 6 .  acetaminophen (TYLENOL) 500 MG tablet, Take 1,000 mg by mouth every 6 (six) hours as needed for mild pain. , Disp: , Rfl:  .  acetaminophen-codeine (TYLENOL #3) 300-30 MG tablet, Take 1 tablet by mouth every 6 (six) hours as needed for moderate pain., Disp: , Rfl:  .  albuterol (PROVENTIL HFA;VENTOLIN HFA) 108 (90 BASE) MCG/ACT inhaler, Inhale 2 puffs into the lungs every 6 (six) hours as needed for wheezing or shortness of breath. For shortness of breath, Disp: , Rfl:  .  dorzolamide-timolol (COSOPT) 22.3-6.8 MG/ML ophthalmic solution, Place 1 drop into both eyes 2 (two) times daily., Disp: , Rfl:  .  Travoprost, BAK Free, (TRAVATAN) 0.004 % SOLN ophthalmic solution, Place 1 drop into both eyes at bedtime., Disp: , Rfl:  Allergies  Allergen Reactions  . Pork-Derived Products Nausea And Vomiting     Social History   Social History  . Marital status: Single    Spouse name: N/A  . Number of children: N/A  . Years of education: N/A   Occupational History  . Not on file.   Social History Main Topics  . Smoking status: Former Smoker    Types: Cigarettes    Quit date: 09/04/1998  . Smokeless tobacco: Never Used  . Alcohol use Yes     Comment: occasional  . Drug use: No  . Sexual activity: Yes  Birth control/ protection: None   Other Topics Concern  . Not on file   Social History Narrative  . No narrative on file    Physical Exam  Pulmonary/Chest: No respiratory distress. She has no wheezes. She has no rales.  Abdominal: She exhibits no distension. There is no tenderness. There is no guarding.  Musculoskeletal: She exhibits edema.  Edema noted to pt's legs/ she has them elevated at this time  Skin: Skin is warm and dry. She is not diaphoretic.        Future Appointments Date Time Provider [place]  04/09/2017 10:40 AM Emily Filbert, MD [facility]  05/03/2017  10:15 AM Josue Hector, MD [facility]  05/22/2017 3:30 PM Marshell Garfinkel, MD [facility] None  05/24/2017 2:30 PM [facility] PA/NP [facility] None  05/29/2017 9:15 AM Tuchman, Leslye Peer, DPM [facility]    ATF pt right hip pain that radiates to the left side.  Pt has hx of same, she took tylenol with codeine about 30 mins prior to my arrival. Pt has taken all of her meds without missing a dose within this week. Pt is still waiting physician approval for CPAP machine.  She denies sob, dizziness, headache and chest pain. rx bottles verified and pill box refilled.    BP (!) 118/58 (BP Location: Left Arm, Patient Position: Sitting)   Pulse (!) 50   Resp 16   Wt 263 lb (119.3 kg)   SpO2 100%   BMI 49.69 kg/m  cbg 128  Weight yesterday-262 Last visit weight-263    Nile Dorning, EMT Paramedic 03/28/2017    ACTION: Home visit completed

## 2017-03-29 ENCOUNTER — Telehealth: Payer: Self-pay | Admitting: Cardiology

## 2017-03-29 NOTE — Telephone Encounter (Signed)
New message    Ivin Booty from [facility] is calling to see if fax was received. She states she faxed it over on 6/15 and 6/30. Please call.

## 2017-03-29 NOTE — Telephone Encounter (Signed)
Informed [name] that the fax has been received but Dr. Radford Pax has been out of the office.  She understands as soon as she returns the paper will be signed and faxed. She was grateful for follow-up.

## 2017-04-02 NOTE — Telephone Encounter (Signed)
Informed patient that Dr. Radford Pax has signed her paperwork and it has been placed in Medical Records' "to be faxed" bin. She was grateful for call.

## 2017-04-03 ENCOUNTER — Other Ambulatory Visit (HOSPITAL_COMMUNITY): Payer: Self-pay

## 2017-04-03 NOTE — Progress Notes (Signed)
Paramedicine Encounter    Patient ID: [name], female    DOB: 06/17/1956, 61 y.o.   MRN: [id number]    Patient Care Team: Javier Docker, MD as PCP - General (Internal Medicine)  Patient Active Problem List   Diagnosis Date Noted  . Hypertensive heart and renal disease   . SOB (shortness of breath)   . Chronic diastolic heart failure ([place]) 11/30/2016  . CKD (chronic kidney disease) stage 3, GFR 30-59 ml/min 11/30/2016  . Thrombocytopenia ([place]) 11/30/2016  . Sinus bradycardia 11/30/2016  . POSTMENOPAUSAL BLEEDING 05/09/2010  . ANEMIA 05/05/2010  . DEPRESSION 04/26/2010  . TMJ SYNDROME 04/26/2010  . DEGENERATIVE DISC DISEASE, CERVICAL SPINE 04/26/2010  . CHEST PAIN 04/26/2010  . CHRONIC OBSTRUCTIVE PULMONARY DISEASE, MODERATE 03/26/2010  . MASTALGIA 10/08/2009  . Iron deficiency anemia 07/12/2009  . Diabetes mellitus ([place]) 07/02/2009  . Hyperlipidemia 07/02/2009  . Morbid obesity ([place]) 07/02/2009  . OSA (obstructive sleep apnea) 07/02/2009  . Glaucoma 07/02/2009  . Hypertension 07/02/2009  . ASTHMA 07/02/2009  . LOW BACK PAIN SYNDROME 07/02/2009  . BACK PAIN 07/02/2009  . PERIPHERAL EDEMA 07/02/2009  . GROIN PAIN 07/02/2009  . TOBACCO USE, QUIT 07/02/2009  . HEMORRHOIDS, INTERNAL 07/16/2008  . DIVERTICULOSIS OF COLON 07/16/2008    Current Outpatient Prescriptions:  .  amLODipine (NORVASC) 5 MG tablet, TAKE 1 TABLET BY MOUTH EVERY DAY, Disp: 90 tablet, Rfl: 3 .  doxazosin (CARDURA) 4 MG tablet, Take 4 mg by mouth at bedtime., Disp: , Rfl:  .  potassium chloride SA (K-DUR,KLOR-CON) 20 MEQ tablet, Take 20 mEq by mouth every other day. , Disp: , Rfl: 6 .  torsemide (DEMADEX) 20 MG tablet, Take 3 tablets (60 mg total) by mouth every morning. Take EXTRA 40mg  (2 tabs) in pm on 7/5, 7/6, and 7/7., Disp: 96 tablet, Rfl: 6 .  acetaminophen (TYLENOL) 500 MG tablet, Take 1,000 mg by mouth every 6 (six) hours as needed for mild pain. , Disp: , Rfl:  .  acetaminophen-codeine  (TYLENOL #3) 300-30 MG tablet, Take 1 tablet by mouth every 6 (six) hours as needed for moderate pain., Disp: , Rfl:  .  albuterol (PROVENTIL HFA;VENTOLIN HFA) 108 (90 BASE) MCG/ACT inhaler, Inhale 2 puffs into the lungs every 6 (six) hours as needed for wheezing or shortness of breath. For shortness of breath, Disp: , Rfl:  .  dorzolamide-timolol (COSOPT) 22.3-6.8 MG/ML ophthalmic solution, Place 1 drop into both eyes 2 (two) times daily., Disp: , Rfl:  .  insulin aspart (NOVOLOG) 100 UNIT/ML injection, Inject into the skin 3 (three) times daily before meals. Per sliding scale, Disp: , Rfl:  .  insulin glargine (LANTUS) 100 UNIT/ML injection, Inject 50 Units into the skin at bedtime., Disp: , Rfl:  .  Travoprost, BAK Free, (TRAVATAN) 0.004 % SOLN ophthalmic solution, Place 1 drop into both eyes at bedtime., Disp: , Rfl:  Allergies  Allergen Reactions  . Pork-Derived Products Nausea And Vomiting     Social History   Social History  . Marital status: Single    Spouse name: N/A  . Number of children: N/A  . Years of education: N/A   Occupational History  . Not on file.   Social History Main Topics  . Smoking status: Former Smoker    Types: Cigarettes    Quit date: 09/04/1998  . Smokeless tobacco: Never Used  . Alcohol use Yes     Comment: occasional  . Drug use: No  . Sexual activity: Yes  Birth control/ protection: None   Other Topics Concern  . Not on file   Social History Narrative  . No narrative on file    Physical Exam  Pulmonary/Chest: No respiratory distress. She has no wheezes. She has no rales.  Abdominal: She exhibits no distension. There is no tenderness. There is no guarding.  Musculoskeletal: She exhibits edema.  Edema in both feet and ankles   Skin: Skin is warm and dry. She is not diaphoretic.        Future Appointments Date Time Provider [place]  04/09/2017 10:40 AM Emily Filbert, MD [facility]  05/03/2017 10:15 AM Josue Hector, MD  [facility]  05/22/2017 3:30 PM Marshell Garfinkel, MD [facility] None  05/24/2017 2:30 PM [facility] PA/NP [facility] None  05/29/2017 9:15 AM Tuchman, Leslye Peer, DPM [facility]    ATF pt CAO 4 sitting in the kitchen cooking.  She stated that she was "hurting yesterday" but she is very vague on what was hurting.  She has a hx of leg and hip pain. she stated that she is not in pain at this point, but when she does have pain she sits down.  Pt denies sob, chest pain, dizziness and headache.  Pt missed one dose of gabapentin on Sunday. rx bottles verified and pill box refilled.  BP (!) 124/56 (BP Location: Left Arm, Patient Position: Sitting, Cuff Size: Large)   Pulse (!) 50   Resp 12   Wt 264 lb 6.4 oz (119.9 kg)   SpO2 99%   BMI 49.96 kg/m   cbg 161  Weight yesterday-264.2 Last visit weight-263    Traquan Duarte, EMT Paramedic 04/03/2017    ACTION: Home visit completed Next visit planned for next tuesday

## 2017-04-09 ENCOUNTER — Ambulatory Visit (INDEPENDENT_AMBULATORY_CARE_PROVIDER_SITE_OTHER): Payer: Medicaid Other | Admitting: Obstetrics & Gynecology

## 2017-04-09 ENCOUNTER — Encounter: Payer: Self-pay | Admitting: Obstetrics & Gynecology

## 2017-04-09 ENCOUNTER — Other Ambulatory Visit: Payer: Self-pay | Admitting: Obstetrics & Gynecology

## 2017-04-09 VITALS — BP 166/63 | HR 52 | Wt 289.9 lb

## 2017-04-09 DIAGNOSIS — Z0000 Encounter for general adult medical examination without abnormal findings: Secondary | ICD-10-CM

## 2017-04-09 DIAGNOSIS — N950 Postmenopausal bleeding: Secondary | ICD-10-CM

## 2017-04-09 DIAGNOSIS — Z1231 Encounter for screening mammogram for malignant neoplasm of breast: Secondary | ICD-10-CM

## 2017-04-09 MED ORDER — MISOPROSTOL 200 MCG PO TABS: ORAL_TABLET | ORAL | 0 refills | Status: DC

## 2017-04-09 NOTE — Progress Notes (Signed)
   Subjective:    Patient ID: [name], female    DOB: 10-23-1955, 61 y.o.   MRN: [id number]  HPI 61 yo SAA P1 (82 yo daughter) here for PMB for the last month. She has fibroids. She had this happen in 2011 and had a EMBX at that time.   Review of Systems Pap normal 8/17    Objective:   Physical Exam Morbidly obese pleasant BFNAD Breathing, conversing normally She ambulates slowly, has swollen LEs Abd- benign, obese Cervix- no lesions, small amount of dark blood noted        Assessment & Plan:  Preventative- mammogram DUB- schedule u/s and EMBX in 1-2 weeks I will pretreat with cytotec

## 2017-04-09 NOTE — Progress Notes (Signed)
[place] scheduled for July 30th  @ 1100.  Mammogram scheduled for August 3rd @ 0910  @ [facility].  Pt notified.

## 2017-04-10 ENCOUNTER — Other Ambulatory Visit (HOSPITAL_COMMUNITY): Payer: Self-pay
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
## 2017-04-16 ENCOUNTER — Ambulatory Visit (HOSPITAL_COMMUNITY): Payer: Medicaid Other

## 2017-04-17 ENCOUNTER — Other Ambulatory Visit (HOSPITAL_COMMUNITY): Payer: Self-pay

## 2017-04-17 ENCOUNTER — Encounter (HOSPITAL_COMMUNITY): Payer: Medicaid Other | Admitting: Cardiology

## 2017-04-17 ENCOUNTER — Ambulatory Visit (HOSPITAL_COMMUNITY): Payer: Medicaid Other

## 2017-04-17 NOTE — Progress Notes (Signed)
Paramedicine Encounter    Patient ID: [name], female    DOB: 1956/09/07, 61 y.o.   MRN: [id number]    Patient Care Team: Javier Docker, MD as PCP - General (Internal Medicine)  Patient Active Problem List   Diagnosis Date Noted  . Hypertensive heart and renal disease   . SOB (shortness of breath)   . Chronic diastolic heart failure ([place]) 11/30/2016  . CKD (chronic kidney disease) stage 3, GFR 30-59 ml/min 11/30/2016  . Thrombocytopenia ([place]) 11/30/2016  . Sinus bradycardia 11/30/2016  . PMB (postmenopausal bleeding) 05/09/2010  . ANEMIA 05/05/2010  . DEPRESSION 04/26/2010  . TMJ SYNDROME 04/26/2010  . DEGENERATIVE DISC DISEASE, CERVICAL SPINE 04/26/2010  . CHEST PAIN 04/26/2010  . CHRONIC OBSTRUCTIVE PULMONARY DISEASE, MODERATE 03/26/2010  . MASTALGIA 10/08/2009  . Iron deficiency anemia 07/12/2009  . Diabetes mellitus ([place]) 07/02/2009  . Hyperlipidemia 07/02/2009  . Morbid obesity ([place]) 07/02/2009  . OSA (obstructive sleep apnea) 07/02/2009  . Glaucoma 07/02/2009  . Hypertension 07/02/2009  . ASTHMA 07/02/2009  . LOW BACK PAIN SYNDROME 07/02/2009  . BACK PAIN 07/02/2009  . PERIPHERAL EDEMA 07/02/2009  . GROIN PAIN 07/02/2009  . TOBACCO USE, QUIT 07/02/2009  . HEMORRHOIDS, INTERNAL 07/16/2008  . DIVERTICULOSIS OF COLON 07/16/2008    Current Outpatient Prescriptions:  .  amLODipine (NORVASC) 5 MG tablet, TAKE 1 TABLET BY MOUTH EVERY DAY, Disp: 90 tablet, Rfl: 3 .  doxazosin (CARDURA) 4 MG tablet, Take 4 mg by mouth at bedtime., Disp: , Rfl:  .  potassium chloride SA (K-DUR,KLOR-CON) 20 MEQ tablet, Take 20 mEq by mouth every other day. , Disp: , Rfl: 6 .  torsemide (DEMADEX) 20 MG tablet, Take 3 tablets (60 mg total) by mouth every morning. Take EXTRA 40mg  (2 tabs) in pm on 7/5, 7/6, and 7/7., Disp: 96 tablet, Rfl: 6 .  acetaminophen (TYLENOL) 500 MG tablet, Take 1,000 mg by mouth every 6 (six) hours as needed for mild pain. , Disp: , Rfl:  .   acetaminophen-codeine (TYLENOL #3) 300-30 MG tablet, Take 1 tablet by mouth every 6 (six) hours as needed for moderate pain., Disp: , Rfl:  .  albuterol (PROVENTIL HFA;VENTOLIN HFA) 108 (90 BASE) MCG/ACT inhaler, Inhale 2 puffs into the lungs every 6 (six) hours as needed for wheezing or shortness of breath. For shortness of breath, Disp: , Rfl:  .  dorzolamide-timolol (COSOPT) 22.3-6.8 MG/ML ophthalmic solution, Place 1 drop into both eyes 2 (two) times daily., Disp: , Rfl:  .  insulin aspart (NOVOLOG) 100 UNIT/ML injection, Inject into the skin 3 (three) times daily before meals. Per sliding scale, Disp: , Rfl:  .  insulin glargine (LANTUS) 100 UNIT/ML injection, Inject 50 Units into the skin at bedtime., Disp: , Rfl:  .  misoprostol (CYTOTEC) 200 MCG tablet, Take 3 pills by mouth the night before biopsy., Disp: 3 tablet, Rfl: 0 .  Travoprost, BAK Free, (TRAVATAN) 0.004 % SOLN ophthalmic solution, Place 1 drop into both eyes at bedtime., Disp: , Rfl:  Allergies  Allergen Reactions  . Pork-Derived Products Nausea And Vomiting     Social History   Social History  . Marital status: Single    Spouse name: N/A  . Number of children: N/A  . Years of education: N/A   Occupational History  . Not on file.   Social History Main Topics  . Smoking status: Former Smoker    Types: Cigarettes    Quit date: 09/04/1998  . Smokeless tobacco: Never Used  .  Alcohol use Yes     Comment: occasional  . Drug use: No  . Sexual activity: Yes    Birth control/ protection: None   Other Topics Concern  . Not on file   Social History Narrative  . No narrative on file    Physical Exam  Pulmonary/Chest: No respiratory distress. She has no wheezes. She has no rales.  Abdominal: She exhibits no distension. There is no tenderness. There is no guarding.  Musculoskeletal: She exhibits edema.  Skin: Skin is warm and dry. She is not diaphoretic.        Future Appointments Date Time Provider  [place]  04/20/2017 9:30 AM GI-BCG MM 2 GI-BCGMM GI-BREAST CE  04/23/2017 10:00 AM WH-US 1 WH-US 203  05/02/2017 3:20 PM Aletha Halim, MD [facility]  05/03/2017 10:15 AM Josue Hector, MD [facility]  05/22/2017 3:30 PM Marshell Garfinkel, MD [facility] None  05/24/2017 2:30 PM [facility] PA/NP [facility] None  05/29/2017 9:15 AM Tuchman, Leslye Peer, DPM [facility]    ATF pt CAO x4 sitting in her recliner. Pt left a text message stating that her weight was "in the red because she had gained 10lbs over a week".  I called the [facility] clinic and advised them of the same during our visit. Pt was unsure of her weight upon my arrival, she stated that she couldn't see if the numbers read "270 something or not". I had her weigh while I watched her and the weight was 268.  Chantel at the clinic was on the phone during this time and advised pt to take an extra 40mg  torsemide today.  I placed the extra rx in the pill box and advised pt accordingly.  She missed one dose of torsemide on sat.  Pt denies sob, dizziness, headache and chest pain.  She stated that her feet feels swollen but I don't see an difference.  I advised pt to elevated her feet and legs while at home and continue to watch what she's eating and drinking.  She was very vague when I asked her about what foods she's been eating lately.  There's several water bottles on the floor and table.  rx bottles verified and pill box refilled.   BP (!) 142/68 (BP Location: Left Arm, Patient Position: Sitting, Cuff Size: Large)   Pulse (!) 55   Resp 16   Wt 268 lb 6.4 oz (121.7 kg)   SpO2 99%   BMI 50.71 kg/m   **rx called in: Amlodipine Doxazosin Torsemide  Weight yesterday-267.6 Last visit weight-264    Tommaso Cavitt, EMT Paramedic 04/17/2017    ACTION: Home visit completed

## 2017-04-20 ENCOUNTER — Ambulatory Visit
Admission: RE | Admit: 2017-04-20 | Discharge: 2017-04-20 | Disposition: A | Discharge status: Home / Self Care | Payer: Medicaid Other | Source: Ambulatory Visit | Attending: Obstetrics & Gynecology | Admitting: Obstetrics & Gynecology

## 2017-04-20 DIAGNOSIS — Z1231 Encounter for screening mammogram for malignant neoplasm of breast: Secondary | ICD-10-CM

## 2017-04-23 ENCOUNTER — Ambulatory Visit (HOSPITAL_COMMUNITY): Payer: Medicaid Other

## 2017-04-24 ENCOUNTER — Telehealth: Payer: Self-pay

## 2017-04-24 ENCOUNTER — Other Ambulatory Visit (HOSPITAL_COMMUNITY): Payer: Self-pay

## 2017-04-24 NOTE — Progress Notes (Signed)
Paramedicine Encounter    Patient ID: [name], female    DOB: [birth year], 61 y.o.   MRN: [id number]    Patient Care Team: Javier Docker, MD as PCP - General (Internal Medicine)  Patient Active Problem List   Diagnosis Date Noted  . Hypertensive heart and renal disease   . SOB (shortness of breath)   . Chronic diastolic heart failure ([place]) 11/30/2016  . CKD (chronic kidney disease) stage 3, GFR 30-59 ml/min 11/30/2016  . Thrombocytopenia ([place]) 11/30/2016  . Sinus bradycardia 11/30/2016  . PMB (postmenopausal bleeding) 05/09/2010  . ANEMIA 05/05/2010  . DEPRESSION 04/26/2010  . TMJ SYNDROME 04/26/2010  . DEGENERATIVE DISC DISEASE, CERVICAL SPINE 04/26/2010  . CHEST PAIN 04/26/2010  . CHRONIC OBSTRUCTIVE PULMONARY DISEASE, MODERATE 03/26/2010  . MASTALGIA 10/08/2009  . Iron deficiency anemia 07/12/2009  . Diabetes mellitus ([place]) 07/02/2009  . Hyperlipidemia 07/02/2009  . Morbid obesity ([place]) 07/02/2009  . OSA (obstructive sleep apnea) 07/02/2009  . Glaucoma 07/02/2009  . Hypertension 07/02/2009  . ASTHMA 07/02/2009  . LOW BACK PAIN SYNDROME 07/02/2009  . BACK PAIN 07/02/2009  . PERIPHERAL EDEMA 07/02/2009  . GROIN PAIN 07/02/2009  . TOBACCO USE, QUIT 07/02/2009  . HEMORRHOIDS, INTERNAL 07/16/2008  . DIVERTICULOSIS OF COLON 07/16/2008    Current Outpatient Prescriptions:  .  albuterol (PROVENTIL HFA;VENTOLIN HFA) 108 (90 BASE) MCG/ACT inhaler, Inhale 2 puffs into the lungs every 6 (six) hours as needed for wheezing or shortness of breath. For shortness of breath, Disp: , Rfl:  .  amLODipine (NORVASC) 5 MG tablet, TAKE 1 TABLET BY MOUTH EVERY DAY, Disp: 90 tablet, Rfl: 3 .  doxazosin (CARDURA) 4 MG tablet, Take 4 mg by mouth at bedtime., Disp: , Rfl:  .  insulin aspart (NOVOLOG) 100 UNIT/ML injection, Inject into the skin 3 (three) times daily before meals. Per sliding scale, Disp: , Rfl:  .  insulin glargine (LANTUS) 100 UNIT/ML injection, Inject 50 Units into the  skin at bedtime., Disp: , Rfl:  .  potassium chloride SA (K-DUR,KLOR-CON) 20 MEQ tablet, Take 20 mEq by mouth every other day. , Disp: , Rfl: 6 .  torsemide (DEMADEX) 20 MG tablet, Take 3 tablets (60 mg total) by mouth every morning. Take EXTRA 40mg  (2 tabs) in pm on 7/5, 7/6, and 7/7., Disp: 96 tablet, Rfl: 6 .  acetaminophen (TYLENOL) 500 MG tablet, Take 1,000 mg by mouth every 6 (six) hours as needed for mild pain. , Disp: , Rfl:  .  acetaminophen-codeine (TYLENOL #3) 300-30 MG tablet, Take 1 tablet by mouth every 6 (six) hours as needed for moderate pain., Disp: , Rfl:  .  dorzolamide-timolol (COSOPT) 22.3-6.8 MG/ML ophthalmic solution, Place 1 drop into both eyes 2 (two) times daily., Disp: , Rfl:  .  misoprostol (CYTOTEC) 200 MCG tablet, Take 3 pills by mouth the night before biopsy., Disp: 3 tablet, Rfl: 0 .  Travoprost, BAK Free, (TRAVATAN) 0.004 % SOLN ophthalmic solution, Place 1 drop into both eyes at bedtime., Disp: , Rfl:  Allergies  Allergen Reactions  . Pork-Derived Products Nausea And Vomiting     Social History   Social History  . Marital status: Single    Spouse name: N/A  . Number of children: N/A  . Years of education: N/A   Occupational History  . Not on file.   Social History Main Topics  . Smoking status: Former Smoker    Types: Cigarettes    Quit date: 09/04/1998  . Smokeless tobacco: Never Used  .  Alcohol use Yes     Comment: occasional  . Drug use: No  . Sexual activity: Yes    Birth control/ protection: None   Other Topics Concern  . Not on file   Social History Narrative  . No narrative on file    Physical Exam  Pulmonary/Chest: No respiratory distress. She has no wheezes. She has no rales.  Abdominal: She exhibits no distension. There is no tenderness. There is no guarding.  Musculoskeletal: She exhibits edema.  Skin: Skin is warm and dry. She is not diaphoretic.        Future Appointments Date Time Provider [address]   04/27/2017 10:00 AM [facility] 1 [facility]-US 203  05/02/2017 3:20 PM Aletha Halim, MD [facility]  05/03/2017 10:15 AM Josue Hector, MD [facility]  05/22/2017 3:30 PM Marshell Garfinkel, MD [facility] None  05/24/2017 2:30 PM [facility] PA/NP [facility] None  05/29/2017 9:15 AM Tuchman, Leslye Peer, DPM [facility]    ATF pt CAO x4 laying in the recliner c/o right side back pain; pt has hx of the same.  Pt hadn't taken amlodipine or insulin yet. She took the amlodipine during our visit.  She has been sleeping a lot today, she stated that she wakes up at night.  She hasn't eaten today and her CBG @ 68.  I advised her of the same therefore she went to the kitchen to eat.  Pt denies sob, dizziness and headache.  She has taken her meds without difficulty.  rx bottles verified and pill box refilled.  During our visit a CPR across her yard came out.  When I returned to finish our appointment pt was still CAO x4 and eating at the kitchen table.   BP (!) 144/70 (BP Location: Left Arm, Patient Position: Sitting)   Pulse (!) 59   Resp 16   Wt 263 lb 6.4 oz (119.5 kg)   SpO2 97%   BMI 49.77 kg/m  cbg  Weight yesterday-264 Last visit weight-268    Daymien Goth, EMT Paramedic 04/25/2017    ACTION: Home visit completed

## 2017-04-24 NOTE — Telephone Encounter (Signed)
Patient called the office wanting to know the date of her endometrial bx appt. Called patient to tell her that her appointment is scheduled for 8/15 @ 3:20 with Dr. Ilda Basset. Patient verbalized understanding and had no questions.

## 2017-04-27 ENCOUNTER — Ambulatory Visit (HOSPITAL_COMMUNITY)
Admission: RE | Admit: 2017-04-27 | Discharge: 2017-04-27 | Disposition: A | Discharge status: Home / Self Care | Payer: Medicaid Other | Source: Ambulatory Visit | Attending: Obstetrics & Gynecology | Admitting: Obstetrics & Gynecology

## 2017-04-27 DIAGNOSIS — N950 Postmenopausal bleeding: Secondary | ICD-10-CM | POA: Insufficient documentation

## 2017-04-27 DIAGNOSIS — R938 Abnormal findings on diagnostic imaging of other specified body structures: Secondary | ICD-10-CM | POA: Insufficient documentation

## 2017-05-01 ENCOUNTER — Other Ambulatory Visit (HOSPITAL_COMMUNITY): Payer: Self-pay

## 2017-05-01 ENCOUNTER — Encounter: Payer: Self-pay | Admitting: Obstetrics and Gynecology

## 2017-05-01 NOTE — Progress Notes (Signed)
Paramedicine Encounter    Patient ID: [name], female    DOB: 05-08-1956, 61 y.o.   MRN: [id number]    Patient Care Team: Javier Docker, MD as PCP - General (Internal Medicine)  Patient Active Problem List   Diagnosis Date Noted  . Hypertensive heart and renal disease   . SOB (shortness of breath)   . Chronic diastolic heart failure ([place]) 11/30/2016  . CKD (chronic kidney disease) stage 3, GFR 30-59 ml/min 11/30/2016  . Thrombocytopenia ([place]) 11/30/2016  . Sinus bradycardia 11/30/2016  . PMB (postmenopausal bleeding) 05/09/2010  . ANEMIA 05/05/2010  . DEPRESSION 04/26/2010  . TMJ SYNDROME 04/26/2010  . DEGENERATIVE DISC DISEASE, CERVICAL SPINE 04/26/2010  . CHEST PAIN 04/26/2010  . CHRONIC OBSTRUCTIVE PULMONARY DISEASE, MODERATE 03/26/2010  . MASTALGIA 10/08/2009  . Iron deficiency anemia 07/12/2009  . Diabetes mellitus ([place]) 07/02/2009  . Hyperlipidemia 07/02/2009  . BMI 45.0-49.9, adult ([place]) 07/02/2009  . OSA (obstructive sleep apnea) 07/02/2009  . Glaucoma 07/02/2009  . Hypertension 07/02/2009  . ASTHMA 07/02/2009  . LOW BACK PAIN SYNDROME 07/02/2009  . BACK PAIN 07/02/2009  . PERIPHERAL EDEMA 07/02/2009  . GROIN PAIN 07/02/2009  . TOBACCO USE, QUIT 07/02/2009  . HEMORRHOIDS, INTERNAL 07/16/2008  . DIVERTICULOSIS OF COLON 07/16/2008    Current Outpatient Prescriptions:  .  amLODipine (NORVASC) 5 MG tablet, TAKE 1 TABLET BY MOUTH EVERY DAY, Disp: 90 tablet, Rfl: 3 .  doxazosin (CARDURA) 4 MG tablet, Take 4 mg by mouth at bedtime., Disp: , Rfl:  .  misoprostol (CYTOTEC) 200 MCG tablet, Take 3 pills by mouth the night before biopsy., Disp: 3 tablet, Rfl: 0 .  potassium chloride SA (K-DUR,KLOR-CON) 20 MEQ tablet, Take 20 mEq by mouth every other day. , Disp: , Rfl: 6 .  torsemide (DEMADEX) 20 MG tablet, Take 3 tablets (60 mg total) by mouth every morning. Take EXTRA 40mg  (2 tabs) in pm on 7/5, 7/6, and 7/7., Disp: 96 tablet, Rfl: 6 .  acetaminophen (TYLENOL)  500 MG tablet, Take 1,000 mg by mouth every 6 (six) hours as needed for mild pain. , Disp: , Rfl:  .  acetaminophen-codeine (TYLENOL #3) 300-30 MG tablet, Take 1 tablet by mouth every 6 (six) hours as needed for moderate pain., Disp: , Rfl:  .  albuterol (PROVENTIL HFA;VENTOLIN HFA) 108 (90 BASE) MCG/ACT inhaler, Inhale 2 puffs into the lungs every 6 (six) hours as needed for wheezing or shortness of breath. For shortness of breath, Disp: , Rfl:  .  dorzolamide-timolol (COSOPT) 22.3-6.8 MG/ML ophthalmic solution, Place 1 drop into both eyes 2 (two) times daily., Disp: , Rfl:  .  insulin aspart (NOVOLOG) 100 UNIT/ML injection, Inject into the skin 3 (three) times daily before meals. Per sliding scale, Disp: , Rfl:  .  insulin glargine (LANTUS) 100 UNIT/ML injection, Inject 50 Units into the skin at bedtime., Disp: , Rfl:  .  Travoprost, BAK Free, (TRAVATAN) 0.004 % SOLN ophthalmic solution, Place 1 drop into both eyes at bedtime., Disp: , Rfl:  Allergies  Allergen Reactions  . Pork-Derived Products Nausea And Vomiting     Social History   Social History  . Marital status: Single    Spouse name: N/A  . Number of children: N/A  . Years of education: N/A   Occupational History  . Not on file.   Social History Main Topics  . Smoking status: Former Smoker    Types: Cigarettes    Quit date: 09/04/1998  . Smokeless tobacco: Never Used  .  Alcohol use Yes     Comment: occasional  . Drug use: No  . Sexual activity: Yes    Birth control/ protection: None   Other Topics Concern  . Not on file   Social History Narrative  . No narrative on file    Physical Exam  Pulmonary/Chest: No respiratory distress. She has no wheezes. She has no rales.  Abdominal: She exhibits no distension. There is no guarding.  Musculoskeletal: She exhibits edema.  Skin: Skin is warm and dry. She is not diaphoretic.        Future Appointments Date Time Provider [place]  05/02/2017 9:45 AM  Gean Birchwood, DPM [facility]  05/02/2017 3:20 PM Aletha Halim, MD [place]  05/03/2017 10:15 AM Josue Hector, MD [facility]  05/22/2017 3:30 PM Marshell Garfinkel, MD [facility] None  05/24/2017 2:30 PM [facility] PA/NP [facility] None  05/29/2017 9:15 AM Tuchman, Leslye Peer, DPM [facility]    ATF pt CAO x4 sitting in the recliner sleeping c/o her feet hurting so bad that she cant walk.  Pt's feet are swollen and very tight. She has taken her medications as prescribed but she can not tell me what she has eaten.  She stated that she hadn't eaten any meals today due to not being able to stand on her feet.  She has eaten a several seasoned croutons throughout the day.  Pt has an appointment to see the foot doctor tomorrow for the same.  She has taken all of her medications this week.  Pt's vitals noted. She hasnt weighed today or yesterday due to the pain.  Pt's feet and legs looks a lot bigger since the last time I evaluated her. rx bottles verified and pill box refilled.   CBG of 221  BP (!) 152/82 (BP Location: Left Arm, Patient Position: Sitting, Cuff Size: Large)   Pulse (!) 58   Resp 16   SpO2 99%    Jaziya Obarr, EMT Paramedic 05/01/2017    ACTION: Home visit completed

## 2017-05-01 NOTE — Progress Notes (Signed)
Advanced Heart Failure Clinic Note   Referring Physician: Dr. Johnsie Cancel Primary Care: [facility], [place]  Primary Cardiologist: Dr. Johnsie Cancel HF: New  HPI:  [name] is a 61 y.o. female with PMH Chronic diastolic CHF, OSA, Morbid obesity, DM2, HTN, CKD, and sinus bradycardia.   Admitted 11/2016 with CHF. Echo and RHC as below. Normal LVEF with grade 2 DD. RHC with normal pressures s/p diuresis. Discharge weight 246 lbs.   Seen by me  01/18/2017. Switched from lasix 40 mg daily to torsemide 20 mg BID with bloating and weight gain. Pt weight at that visit 252 lbs ( 6 lbs over 2 months)  Continues to have poor diet and ETOH. Eats out a lot with fried food   Social: On disability. Had back surgery 08/2014 and has ongoing chronic back pain. Lives at home with roommate. Daughter lives in [place]. Gout on prednisone and that has made BS, weight and edema worse   Review of systems complete and found to be negative unless listed in HPI.    Echo 12/02/16 LVEF 60-65%, Grade 2 DD, Trivial TR, Trivial PI. Normal RV.   Frost 12/06/16: Hemodynamics (mmHg) RA mean 6 RV 48/7 PA 45/14 (mean 23) PCWP 8 PVR 2.7 WU AO 99% Cardiac Output (Fick) 5.6 Cardiac Index (Fick) 2.72  Past Medical History:  Diagnosis Date  . Achilles rupture   . Anxiety   . Arthritis   . Asthma   . Chronic back pain   . Chronic headaches   . Chronic leg pain    bilaterally  . Depression   . Diabetes mellitus 1999   T2DM  . Glaucoma   . H/O gestational diabetes mellitus, not currently pregnant   . Hyperlipidemia   . Hypertension   . Morbid obesity ([place])   . OSA (obstructive sleep apnea)   . Sciatica     Current Outpatient Prescriptions  Medication Sig Dispense Refill  . acetaminophen (TYLENOL) 500 MG tablet Take 1,000 mg by mouth every 6 (six) hours as needed for mild pain.     Marland Kitchen acetaminophen-codeine (TYLENOL #3) 300-30 MG tablet Take 1 tablet by mouth every 6 (six) hours as needed for moderate pain.    Marland Kitchen albuterol  (PROVENTIL HFA;VENTOLIN HFA) 108 (90 BASE) MCG/ACT inhaler Inhale 2 puffs into the lungs every 6 (six) hours as needed for wheezing or shortness of breath. For shortness of breath    . amLODipine (NORVASC) 5 MG tablet TAKE 1 TABLET BY MOUTH EVERY DAY 90 tablet 3  . dorzolamide-timolol (COSOPT) 22.3-6.8 MG/ML ophthalmic solution Place 1 drop into both eyes 2 (two) times daily.    Marland Kitchen doxazosin (CARDURA) 4 MG tablet Take 4 mg by mouth at bedtime.    . insulin aspart (NOVOLOG) 100 UNIT/ML injection Inject into the skin 3 (three) times daily before meals. Per sliding scale    . insulin glargine (LANTUS) 100 UNIT/ML injection Inject 50 Units into the skin at bedtime.    . misoprostol (CYTOTEC) 200 MCG tablet Take 3 pills by mouth the night before biopsy. 3 tablet 0  . potassium chloride SA (K-DUR,KLOR-CON) 20 MEQ tablet Take 20 mEq by mouth every other day.   6  . predniSONE (STERAPRED UNI-PAK 21 TAB) 10 MG (21) TBPK tablet Tapering 6 days dose pack 21 tablet 0  . Travoprost, BAK Free, (TRAVATAN) 0.004 % SOLN ophthalmic solution Place 1 drop into both eyes at bedtime.     No current facility-administered medications for this visit.     Allergies  Allergen Reactions  . Pork-Derived Products Nausea And Vomiting     Social History   Social History  . Marital status: Single    Spouse name: N/A  . Number of children: N/A  . Years of education: N/A   Occupational History  . Not on file.   Social History Main Topics  . Smoking status: Former Smoker    Types: Cigarettes    Quit date: 09/04/1998  . Smokeless tobacco: Never Used  . Alcohol use Yes     Comment: occasional  . Drug use: No  . Sexual activity: Yes    Birth control/ protection: None   Other Topics Concern  . Not on file   Social History Narrative  . No narrative on file      Family History  Problem Relation Age of Onset  . Heart failure Mother   . Renal Disease Mother     Vitals:   05/03/17 1000  BP: 140/80    Pulse: (!) 59  SpO2: 98%  Weight: 266 lb 6 oz (120.8 kg)  Height: 5' 2.1" (1.577 m)   Wt Readings from Last 3 Encounters:  05/03/17 266 lb 6 oz (120.8 kg)  05/02/17 266 lb (120.7 kg)  04/24/17 263 lb 6.4 oz (119.5 kg)    PHYSICAL EXAM: BP 140/80   Pulse (!) 59   Ht 5' 2.1" (1.577 m)   Wt 266 lb 6 oz (120.8 kg)   LMP 02/16/2017 (Within Weeks)   SpO2 98%   BMI 48.56 kg/m  Affect appropriate Obese black female HEENT: normal Neck supple with no adenopathy JVP normal no bruits no thyromegaly Lungs clear with no wheezing and good diaphragmatic motion Heart:  S1/S2 no murmur, no rub, gallop or click PMI normal Abdomen: benighn, BS positve, no tenderness, no AAA no bruit.  No HSM or HJR Distal pulses intact with no bruits Plus 2 LE  edema Neuro non-focal Skin warm and dry No muscular weakness   ASSESSMENT & PLAN:  1. Chronic diastolic CHF: Echo 10/7739 EF 60-65%, Grade 2 DD, trivial TR, normal RV.  - NYHA III. - Volume status elevated in the setting of dietary noncompliance.  - Demedex 60 am and 20-60 mg as needed in PM  - We talked about dietary restrictions, specifically eating out. Told her to avoid fast food, eating out and high sodium foods. Reinforced fluid restrictions, less than 2L a day.  - Suspect that her dyspnea is multifactorial and is out of proportion to her volume status.   2. CKD Stage III - BMET today.  - She tells me that her PCP is recommending a renal ultrasound.   3. Morbid obesity - Body mass index is 48.56 kg/m. - Encouraged her to increase her activity.    4. HTN - Well controlled on current regimen.   5. Sinus bradycardia -Stable. Not on beta blocker.   6. OSA - Had sleep study in May 2018 - CPAP f/u Dr Radford Pax  7. DM2 - Insulin dependent.  - Follows with PCP.   F/U cardiology PRN    Jenkins Rouge, MD 05/03/17

## 2017-05-02 ENCOUNTER — Encounter: Payer: Self-pay | Admitting: Obstetrics & Gynecology

## 2017-05-02 ENCOUNTER — Other Ambulatory Visit (HOSPITAL_COMMUNITY)
Admission: RE | Admit: 2017-05-02 | Discharge: 2017-05-02 | Disposition: A | Discharge status: Home / Self Care | Payer: Medicaid Other | Source: Ambulatory Visit | Attending: Obstetrics & Gynecology | Admitting: Obstetrics & Gynecology

## 2017-05-02 ENCOUNTER — Other Ambulatory Visit: Payer: Self-pay | Admitting: Podiatry

## 2017-05-02 ENCOUNTER — Ambulatory Visit (INDEPENDENT_AMBULATORY_CARE_PROVIDER_SITE_OTHER): Payer: Medicaid Other | Admitting: Obstetrics & Gynecology

## 2017-05-02 ENCOUNTER — Ambulatory Visit (INDEPENDENT_AMBULATORY_CARE_PROVIDER_SITE_OTHER): Payer: Medicaid Other

## 2017-05-02 ENCOUNTER — Ambulatory Visit (INDEPENDENT_AMBULATORY_CARE_PROVIDER_SITE_OTHER): Payer: Medicaid Other | Admitting: Podiatry

## 2017-05-02 ENCOUNTER — Encounter: Payer: Self-pay | Admitting: Podiatry

## 2017-05-02 VITALS — BP 150/73 | HR 55 | Temp 98.6°F | Resp 18

## 2017-05-02 VITALS — BP 147/50 | HR 72 | Wt 266.0 lb

## 2017-05-02 DIAGNOSIS — M25572 Pain in left ankle and joints of left foot: Secondary | ICD-10-CM | POA: Diagnosis not present

## 2017-05-02 DIAGNOSIS — R52 Pain, unspecified: Secondary | ICD-10-CM

## 2017-05-02 DIAGNOSIS — N939 Abnormal uterine and vaginal bleeding, unspecified: Secondary | ICD-10-CM | POA: Diagnosis present

## 2017-05-02 MED ORDER — PREDNISONE 10 MG (21) PO TBPK: ORAL_TABLET | ORAL | 0 refills | Status: DC

## 2017-05-02 NOTE — Progress Notes (Signed)
   Subjective:    Patient ID: [name], female    DOB: [birth year], 61 y.o.   MRN: [id number]  HPI  61 yo SAA P1 (60 yo daughter) here for PMB for the last month. She has fibroids. She had this happen in 2011 and had a EMBX at that time. Her u/s showed a large fibroid and a 15 mm endometrium. She took cytotec last night and had some more bleeding.   Review of Systems Saw her ortho doctor today and diagnosed with gout.    Objective:   Physical Exam Pleasant black female Breathing, conversing, and ambulating normally  UPT negative, consent signed, time out done Cervix prepped with betadine and grasped with a single tooth tenaculum Uterus sounded to 9 cm Pipelle used for 2 passes with a moderate amount of tissue obtained. She tolerated the procedure well.      Assessment & Plan:  PMB- await pathology results RTC 1 week

## 2017-05-02 NOTE — Patient Instructions (Signed)
Present to lab for blood test Feel prescription for prednisone 10 mg tablets On day 1 take 6 tablets over your waking hours Each day after will decrease by one tablet and spread these over your waking hours for a total of 6 days If the pain increases significantly and does not begin to respond somewhat resented emergency department Please be aware that the prednisone will increase her blood glucose levels temporarily   Diabetes and Foot Care Diabetes may cause you to have problems because of poor blood supply (circulation) to your feet and legs. This may cause the skin on your feet to become thinner, break easier, and heal more slowly. Your skin may become dry, and the skin may peel and crack. You may also have nerve damage in your legs and feet causing decreased feeling in them. You may not notice minor injuries to your feet that could lead to infections or more serious problems. Taking care of your feet is one of the most important things you can do for yourself. Follow these instructions at home:  Wear shoes at all times, even in the house. Do not go barefoot. Bare feet are easily injured.  Check your feet daily for blisters, cuts, and redness. If you cannot see the bottom of your feet, use a mirror or ask someone for help.  Wash your feet with warm water (do not use hot water) and mild soap. Then pat your feet and the areas between your toes until they are completely dry. Do not soak your feet as this can dry your skin.  Apply a moisturizing lotion or petroleum jelly (that does not contain alcohol and is unscented) to the skin on your feet and to dry, brittle toenails. Do not apply lotion between your toes.  Trim your toenails straight across. Do not dig under them or around the cuticle. File the edges of your nails with an emery board or nail file.  Do not cut corns or calluses or try to remove them with medicine.  Wear clean socks or stockings every day. Make sure they are not too  tight. Do not wear knee-high stockings since they may decrease blood flow to your legs.  Wear shoes that fit properly and have enough cushioning. To break in new shoes, wear them for just a few hours a day. This prevents you from injuring your feet. Always look in your shoes before you put them on to be sure there are no objects inside.  Do not cross your legs. This may decrease the blood flow to your feet.  If you find a minor scrape, cut, or break in the skin on your feet, keep it and the skin around it clean and dry. These areas may be cleansed with mild soap and water. Do not cleanse the area with peroxide, alcohol, or iodine.  When you remove an adhesive bandage, be sure not to damage the skin around it.  If you have a wound, look at it several times a day to make sure it is healing.  Do not use heating pads or hot water bottles. They may burn your skin. If you have lost feeling in your feet or legs, you may not know it is happening until it is too late.  Make sure your health care provider performs a complete foot exam at least annually or more often if you have foot problems. Report any cuts, sores, or bruises to your health care provider immediately. Contact a health care provider if:  You have  an injury that is not healing.  You have cuts or breaks in the skin.  You have an ingrown nail.  You notice redness on your legs or feet.  You feel burning or tingling in your legs or feet.  You have pain or cramps in your legs and feet.  Your legs or feet are numb.  Your feet always feel cold. Get help right away if:  There is increasing redness, swelling, or pain in or around a wound.  There is a red line that goes up your leg.  Pus is coming from a wound.  You develop a fever or as directed by your health care provider.  You notice a bad smell coming from an ulcer or wound. This information is not intended to replace advice given to you by your health care provider. Make  sure you discuss any questions you have with your health care provider. Document Released: 09/01/2000 Document Revised: 02/10/2016 Document Reviewed: 02/11/2013 Elsevier Interactive Patient Education  2017 [employer].

## 2017-05-02 NOTE — Progress Notes (Signed)
   Subjective:    Patient ID: [name], female    DOB: 06/27/1956, 61 y.o.   MRN: [id number]  HPI This patient presents today with son onset of pain and swelling in the left great toe joint area. Patient states that she has extreme difficulty ambulating because of extreme pain. Patient says the left great toe joint area is very sensitive to standing, walking, or direct shoe pressure. Reduces somewhat with rest and elevation Patient denies history of injury or prior history of a similar problem Patient is diabetic with history of peripheral vascular disease and peripheral neuropathy   Review of Systems  Musculoskeletal:       My left foot has been hurting since monday  All other systems reviewed and are negative.      Objective:   Physical Exam  Patient transfers from wheelchair to treatment chair Patient appears to be in apparent pain Orientated 3  Vascular: DP pulses 2/4 bilaterally PT pulses nonpalpable bilaterally Capillary reflex immediate bilaterally  Neurological: Sensation to 10 g monofilament wire intact 5/5 bilaterally Vibratory sensation nonreactive bilaterally Ankle reflexes reactive bilaterally  Dermatological: No open skin lesions bilaterally There is low-grade edema without erythema or warmth in around the left first MPJ Absent hair growth bilaterally  Musculoskeletal: Exquisite tenderness to palpation left first MPJ and range of motion  X-ray examination foot dated 05/02/2017  Intact bony structure within x-ray in or dislocation There is no increased soft tissue density HAV with slight narrowing of first MPJ joint space Hammertoe second Posterior and inferior calcaneal spurs  Radiographic impression: No acute bony abnormality noted weightbearing x-ray of the left foot dated 05/02/2017      Assessment & Plan:   Assessment: Diabetic with peripheral vascular disease Diabetic with peripheral neuropathy First left MPJ joints pain rule out gout  inflammation  Plan: Patient referred to lab for CBC with differential, sedimentation rate and uric acid Rx prednisone 10 mg 6 day tapering Dosepak Patient informed that blood glucose levels will increase while taking prednisone  Patient instructed if she notice any significantly increased pain, swelling, redness, fever to present to the emergency department  Reappoint 7 days

## 2017-05-03 ENCOUNTER — Encounter (INDEPENDENT_AMBULATORY_CARE_PROVIDER_SITE_OTHER): Payer: Self-pay

## 2017-05-03 ENCOUNTER — Ambulatory Visit (INDEPENDENT_AMBULATORY_CARE_PROVIDER_SITE_OTHER): Payer: Medicaid Other | Admitting: Cardiovascular Disease

## 2017-05-03 ENCOUNTER — Encounter: Payer: Self-pay | Admitting: Cardiovascular Disease

## 2017-05-03 VITALS — BP 140/80 | HR 59 | Ht 62.1 in | Wt 266.4 lb

## 2017-05-03 DIAGNOSIS — R609 Edema, unspecified: Secondary | ICD-10-CM

## 2017-05-03 LAB — CBC WITH DIFFERENTIAL/PLATELET
BASOS PCT: 0 %
Basophils Absolute: 0 cells/uL (ref 0–200)
Eosinophils Absolute: 264 cells/uL (ref 15–500)
Eosinophils Relative: 3 %
HCT: 34.7 % — ABNORMAL LOW (ref 35.0–45.0)
Hemoglobin: 10.7 g/dL — ABNORMAL LOW (ref 11.7–15.5)
LYMPHS PCT: 22 %
Lymphs Abs: 1936 cells/uL (ref 850–3900)
MCH: 25.4 pg — AB (ref 27.0–33.0)
MCHC: 30.8 g/dL — AB (ref 32.0–36.0)
MCV: 82.4 fL (ref 80.0–100.0)
MONOS PCT: 8 %
Monocytes Absolute: 704 cells/uL (ref 200–950)
Neutro Abs: 5896 cells/uL (ref 1500–7800)
Neutrophils Relative %: 67 %
Platelets: 220 10*3/uL (ref 140–400)
RBC: 4.21 MIL/uL (ref 3.80–5.10)
RDW: 14.8 % (ref 11.0–15.0)
WBC: 8.8 10*3/uL (ref 3.8–10.8)

## 2017-05-03 LAB — URIC ACID: Uric Acid, Serum: 12 mg/dL — ABNORMAL HIGH (ref 2.5–7.0)

## 2017-05-03 LAB — SEDIMENTATION RATE: Sed Rate: 15 mm/hr (ref 0–30)

## 2017-05-03 MED ORDER — TORSEMIDE 20 MG PO TABS: 60.0000 mg | ORAL_TABLET | ORAL | 3 refills | Status: DC

## 2017-05-03 NOTE — Patient Instructions (Addendum)
Medication Instructions:  1. CHANGE DEMADEX TO 60 MG TWICE DAILY WITH THE AFTERNOON DOSE AS NEEDED FOR SWELLING  Labwork: NONE ORDERED  Testing/Procedures: NONE ORDERED  Follow-Up: Your physician wants you to follow-up in:AS NEEDED You will receive a reminder letter in the mail two months in advance. If you don't receive a letter, please call our office to schedule the follow-up appointment.   Any Other Special Instructions Will Be Listed Below (If Applicable).     If you need a refill on your cardiac medications before your next appointment, please call your pharmacy.

## 2017-05-08 ENCOUNTER — Telehealth (HOSPITAL_COMMUNITY): Payer: Self-pay

## 2017-05-08 ENCOUNTER — Other Ambulatory Visit (HOSPITAL_COMMUNITY): Payer: Self-pay

## 2017-05-08 NOTE — Telephone Encounter (Signed)
[name] with paramedicine called to get clarification on torsemide dosage. Patient was recently at another cardiologist office and was told to take 20 - 60 mg as needed in the afternoon for fluid.  Takes 60 mg every morning. Advised per Dr. Aundra Dubin to take 20 mg as needed in the afternoon for weight gain 3 lbs or more overnight until she is seen in CHF clinic next week and we will clarify at that time. Advised to return call to clinic if this is not working.  Renee Pain, RN

## 2017-05-08 NOTE — Progress Notes (Signed)
Paramedicine Encounter    Patient ID: [name], female    DOB: 11-20-1955, 61 y.o.   MRN: [id number]    Patient Care Team: Javier Docker, MD as PCP - General (Internal Medicine)  Patient Active Problem List   Diagnosis Date Noted  . Hypertensive heart and renal disease   . SOB (shortness of breath)   . Chronic diastolic heart failure ([place]) 11/30/2016  . CKD (chronic kidney disease) stage 3, GFR 30-59 ml/min 11/30/2016  . Thrombocytopenia ([place]) 11/30/2016  . Sinus bradycardia 11/30/2016  . PMB (postmenopausal bleeding) 05/09/2010  . ANEMIA 05/05/2010  . DEPRESSION 04/26/2010  . TMJ SYNDROME 04/26/2010  . DEGENERATIVE DISC DISEASE, CERVICAL SPINE 04/26/2010  . CHEST PAIN 04/26/2010  . CHRONIC OBSTRUCTIVE PULMONARY DISEASE, MODERATE 03/26/2010  . MASTALGIA 10/08/2009  . Iron deficiency anemia 07/12/2009  . Diabetes mellitus ([place]) 07/02/2009  . Hyperlipidemia 07/02/2009  . BMI 45.0-49.9, adult ([place]) 07/02/2009  . OSA (obstructive sleep apnea) 07/02/2009  . Glaucoma 07/02/2009  . Hypertension 07/02/2009  . ASTHMA 07/02/2009  . LOW BACK PAIN SYNDROME 07/02/2009  . BACK PAIN 07/02/2009  . PERIPHERAL EDEMA 07/02/2009  . GROIN PAIN 07/02/2009  . TOBACCO USE, QUIT 07/02/2009  . HEMORRHOIDS, INTERNAL 07/16/2008  . DIVERTICULOSIS OF COLON 07/16/2008    Current Outpatient Prescriptions:  .  amLODipine (NORVASC) 5 MG tablet, TAKE 1 TABLET BY MOUTH EVERY DAY, Disp: 90 tablet, Rfl: 3 .  doxazosin (CARDURA) 4 MG tablet, Take 4 mg by mouth at bedtime., Disp: , Rfl:  .  potassium chloride SA (K-DUR,KLOR-CON) 20 MEQ tablet, Take 20 mEq by mouth every other day. , Disp: , Rfl: 6 .  torsemide (DEMADEX) 20 MG tablet, Take 3 tablets (60 mg total) by mouth as directed. Take 60 mg in the AM. You may take 60 mg in the PM as needed for swelling, Disp: 180 tablet, Rfl: 3 .  acetaminophen (TYLENOL) 500 MG tablet, Take 1,000 mg by mouth every 6 (six) hours as needed for mild pain. , Disp: ,  Rfl:  .  acetaminophen-codeine (TYLENOL #3) 300-30 MG tablet, Take 1 tablet by mouth every 6 (six) hours as needed for moderate pain., Disp: , Rfl:  .  albuterol (PROVENTIL HFA;VENTOLIN HFA) 108 (90 BASE) MCG/ACT inhaler, Inhale 2 puffs into the lungs every 6 (six) hours as needed for wheezing or shortness of breath. For shortness of breath, Disp: , Rfl:  .  dorzolamide-timolol (COSOPT) 22.3-6.8 MG/ML ophthalmic solution, Place 1 drop into both eyes 2 (two) times daily., Disp: , Rfl:  .  insulin aspart (NOVOLOG) 100 UNIT/ML injection, Inject into the skin 3 (three) times daily before meals. Per sliding scale, Disp: , Rfl:  .  insulin glargine (LANTUS) 100 UNIT/ML injection, Inject 50 Units into the skin at bedtime., Disp: , Rfl:  .  misoprostol (CYTOTEC) 200 MCG tablet, Take 3 pills by mouth the night before biopsy. (Patient not taking: Reported on 05/08/2017), Disp: 3 tablet, Rfl: 0 .  predniSONE (STERAPRED UNI-PAK 21 TAB) 10 MG (21) TBPK tablet, Tapering 6 days dose pack, Disp: 21 tablet, Rfl: 0 .  Travoprost, BAK Free, (TRAVATAN) 0.004 % SOLN ophthalmic solution, Place 1 drop into both eyes at bedtime., Disp: , Rfl:  Allergies  Allergen Reactions  . Pork-Derived Products Nausea And Vomiting     Social History   Social History  . Marital status: Single    Spouse name: N/A  . Number of children: N/A  . Years of education: N/A   Occupational History  .  Not on file.   Social History Main Topics  . Smoking status: Former Smoker    Types: Cigarettes    Quit date: 09/04/1998  . Smokeless tobacco: Never Used  . Alcohol use Yes     Comment: occasional  . Drug use: No  . Sexual activity: Yes    Birth control/ protection: None   Other Topics Concern  . Not on file   Social History Narrative  . No narrative on file    Physical Exam  Pulmonary/Chest: No respiratory distress. She has no wheezes. She has no rales.  Abdominal: She exhibits no distension. There is no tenderness. There  is no guarding.  Musculoskeletal: She exhibits edema.  Skin: Skin is warm and dry. She is not diaphoretic.        Future Appointments Date Time Provider [place]  05/09/2017 9:30 AM Gean Birchwood, [place] [facility]  05/11/2017 9:40 AM Emily Filbert, MD [facility]  05/22/2017 3:30 PM Marshell Garfinkel, MD [facility] None  05/24/2017 2:30 PM [facility] PA/NP [facility] None  05/29/2017 9:15 AM Tuchman, Leslye Peer, DPM [facility]    ATF pt CAO x4 sitting in the living room cc anxiety.  Pt stated that she had an anxiety attack.  She stated that she had an anxiety attack on the transportation van due to vaginal bleeding (post biopsy last week).  Pt was worried that she would mess her clothes up therefore she was became very upset.  Pt has no other issues at this time.  Pt denies denies sob, dizzines and no chest pain.  rx bottles verified and pill box refilled.    cbg 143  BP (!) 188/110 (BP Location: Right Arm, Patient Position: Sitting, Cuff Size: Large)   Resp 16   LMP 02/16/2017 (Within Weeks)   SpO2 96%   Weight yesterday-263 Last visit weight-263    Chaunda Vandergriff, EMT Paramedic 05/08/2017    ACTION: Home visit completed

## 2017-05-09 ENCOUNTER — Encounter: Payer: Self-pay | Admitting: Podiatry

## 2017-05-09 ENCOUNTER — Ambulatory Visit (INDEPENDENT_AMBULATORY_CARE_PROVIDER_SITE_OTHER): Payer: Medicaid Other | Admitting: Podiatry

## 2017-05-09 VITALS — BP 133/53 | HR 57 | Resp 18

## 2017-05-09 DIAGNOSIS — M10072 Idiopathic gout, left ankle and foot: Secondary | ICD-10-CM | POA: Diagnosis not present

## 2017-05-09 DIAGNOSIS — M109 Gout, unspecified: Secondary | ICD-10-CM

## 2017-05-09 NOTE — Patient Instructions (Signed)
Discussion gout episode with your primary care physician at your scheduled visit in September 2018  Gout Gout is painful swelling that can occur in some of your joints. Gout is a type of arthritis. This condition is caused by having too much uric acid in your body. Uric acid is a chemical that forms when your body breaks down substances called purines. Purines are important for building body proteins. When your body has too much uric acid, sharp crystals can form and build up inside your joints. This causes pain and swelling. Gout attacks can happen quickly and be very painful (acute gout). Over time, the attacks can affect more joints and become more frequent (chronic gout). Gout can also cause uric acid to build up under your skin and inside your kidneys. What are the causes? This condition is caused by too much uric acid in your blood. This can occur because:  Your kidneys do not remove enough uric acid from your blood. This is the most common cause.  Your body makes too much uric acid. This can occur with some cancers and cancer treatments. It can also occur if your body is breaking down too many red blood cells (hemolytic anemia).  You eat too many foods that are high in purines. These foods include organ meats and some seafood. Alcohol, especially beer, is also high in purines.  A gout attack may be triggered by trauma or stress. What increases the risk? This condition is more likely to develop in people who:  Have a family history of gout.  Are female and middle-aged.  Are female and have gone through menopause.  Are obese.  Frequently drink alcohol, especially beer.  Are dehydrated.  Lose weight too quickly.  Have an organ transplant.  Have lead poisoning.  Take certain medicines, including aspirin, cyclosporine, diuretics, levodopa, and niacin.  Have kidney disease or psoriasis.  What are the signs or symptoms? An attack of acute gout happens quickly. It usually  occurs in just one joint. The most common place is the big toe. Attacks often start at night. Other joints that may be affected include joints of the feet, ankle, knee, fingers, wrist, or elbow. Symptoms may include:  Severe pain.  Warmth.  Swelling.  Stiffness.  Tenderness. The affected joint may be very painful to touch.  Shiny, red, or purple skin.  Chills and fever.  Chronic gout may cause symptoms more frequently. More joints may be involved. You may also have white or yellow lumps (tophi) on your hands or feet or in other areas near your joints. How is this diagnosed? This condition is diagnosed based on your symptoms, medical history, and physical exam. You may have tests, such as:  Blood tests to measure uric acid levels.  Removal of joint fluid with a needle (aspiration) to look for uric acid crystals.  X-rays to look for joint damage.  How is this treated? Treatment for this condition has two phases: treating an acute attack and preventing future attacks. Acute gout treatment may include medicines to reduce pain and swelling, including:  NSAIDs.  Steroids. These are strong anti-inflammatory medicines that can be taken by mouth (orally) or injected into a joint.  Colchicine. This medicine relieves pain and swelling when it is taken soon after an attack. It can be given orally or through an IV tube.  Preventive treatment may include:  Daily use of smaller doses of NSAIDs or colchicine.  Use of a medicine that reduces uric acid levels in your blood.  Changes to your diet. You may need to see a specialist about healthy eating (dietitian).  Follow these instructions at home: During a Gout Attack  If directed, apply ice to the affected area: ? Put ice in a plastic bag. ? Place a towel between your skin and the bag. ? Leave the ice on for 20 minutes, 2-3 times a day.  Rest the joint as much as possible. If the affected joint is in your leg, you may be given  crutches to use.  Raise (elevate) the affected joint above the level of your heart as often as possible.  Drink enough fluids to keep your urine clear or pale yellow.  Take over-the-counter and prescription medicines only as told by your health care provider.  Do not drive or operate heavy machinery while taking prescription pain medicine.  Follow instructions from your health care provider about eating or drinking restrictions.  Return to your normal activities as told by your health care provider. Ask your health care provider what activities are safe for you. Avoiding Future Gout Attacks  Follow a low-purine diet as told by your dietitian or health care provider. Avoid foods and drinks that are high in purines, including liver, kidney, anchovies, asparagus, herring, mushrooms, mussels, and beer.  Limit alcohol intake to no more than 1 drink a day for nonpregnant women and 2 drinks a day for men. One drink equals 12 oz of beer, 5 oz of wine, or 1 oz of hard liquor.  Maintain a healthy weight or lose weight if you are overweight. If you want to lose weight, talk with your health care provider. It is important that you do not lose weight too quickly.  Start or maintain an exercise program as told by your health care provider.  Drink enough fluids to keep your urine clear or pale yellow.  Take over-the-counter and prescription medicines only as told by your health care provider.  Keep all follow-up visits as told by your health care provider. This is important. Contact a health care provider if:  You have another gout attack.  You continue to have symptoms of a gout attack after10 days of treatment.  You have side effects from your medicines.  You have chills or a fever.  You have burning pain when you urinate.  You have pain in your lower back or belly. Get help right away if:  You have severe or uncontrolled pain.  You cannot urinate. This information is not intended  to replace advice given to you by your health care provider. Make sure you discuss any questions you have with your health care provider. Document Released: 09/01/2000 Document Revised: 02/10/2016 Document Reviewed: 06/17/2015 Elsevier Interactive Patient Education  2017 [place]. Diabetes and Foot Care Diabetes may cause you to have problems because of poor blood supply (circulation) to your feet and legs. This may cause the skin on your feet to become thinner, break easier, and heal more slowly. Your skin may become dry, and the skin may peel and crack. You may also have nerve damage in your legs and feet causing decreased feeling in them. You may not notice minor injuries to your feet that could lead to infections or more serious problems. Taking care of your feet is one of the most important things you can do for yourself. Follow these instructions at home:  Wear shoes at all times, even in the house. Do not go barefoot. Bare feet are easily injured.  Check your feet daily for blisters,  cuts, and redness. If you cannot see the bottom of your feet, use a mirror or ask someone for help.  Wash your feet with warm water (do not use hot water) and mild soap. Then pat your feet and the areas between your toes until they are completely dry. Do not soak your feet as this can dry your skin.  Apply a moisturizing lotion or petroleum jelly (that does not contain alcohol and is unscented) to the skin on your feet and to dry, brittle toenails. Do not apply lotion between your toes.  Trim your toenails straight across. Do not dig under them or around the cuticle. File the edges of your nails with an emery board or nail file.  Do not cut corns or calluses or try to remove them with medicine.  Wear clean socks or stockings every day. Make sure they are not too tight. Do not wear knee-high stockings since they may decrease blood flow to your legs.  Wear shoes that fit properly and have enough  cushioning. To break in new shoes, wear them for just a few hours a day. This prevents you from injuring your feet. Always look in your shoes before you put them on to be sure there are no objects inside.  Do not cross your legs. This may decrease the blood flow to your feet.  If you find a minor scrape, cut, or break in the skin on your feet, keep it and the skin around it clean and dry. These areas may be cleansed with mild soap and water. Do not cleanse the area with peroxide, alcohol, or iodine.  When you remove an adhesive bandage, be sure not to damage the skin around it.  If you have a wound, look at it several times a day to make sure it is healing.  Do not use heating pads or hot water bottles. They may burn your skin. If you have lost feeling in your feet or legs, you may not know it is happening until it is too late.  Make sure your health care provider performs a complete foot exam at least annually or more often if you have foot problems. Report any cuts, sores, or bruises to your health care provider immediately. Contact a health care provider if:  You have an injury that is not healing.  You have cuts or breaks in the skin.  You have an ingrown nail.  You notice redness on your legs or feet.  You feel burning or tingling in your legs or feet.  You have pain or cramps in your legs and feet.  Your legs or feet are numb.  Your feet always feel cold. Get help right away if:  There is increasing redness, swelling, or pain in or around a wound.  There is a red line that goes up your leg.  Pus is coming from a wound.  You develop a fever or as directed by your health care provider.  You notice a bad smell coming from an ulcer or wound. This information is not intended to replace advice given to you by your health care provider. Make sure you discuss any questions you have with your health care provider. Document Released: 09/01/2000 Document Revised: 02/10/2016  Document Reviewed: 02/11/2013 Elsevier Interactive Patient Education  2017 [employer].

## 2017-05-09 NOTE — Progress Notes (Signed)
   Subjective:    Patient ID: [name], female    DOB: Mar 26, 1956, 61 y.o.   MRN: [id number]  HPI This patient presents for follow-up visit of 05/02/2017 at the initial visit of suspect diagnosis of gouty inflammation was made and a 6 day 10 mg prednisone Dosepak was prescribed patient was referred to the lab. Patient has completed the Dosepak and states that her left foot is essentially pain free now and she is able to weight-bear and is resuming or more normal activity.   Review of Systems  Skin:       Left foot is doing better  All other systems reviewed and are negative.      Objective:   Physical Exam  Patient appears orientated 3 in no apparent pain  Vascular: DP pulses 2/4 bilaterally PT pulses nonpalpable bilaterally Capillary reflex immediate bilaterally  Neurological: Sensation to 10 g monofilament wire intact 5/5 bilaterally Vibratory sensation nonreactive bilaterally Ankle reflexes reactive bilaterally  Dermatological: No open skin lesions bilaterally Absent hair growth bilaterally  Musculoskeletal: Mild palpable tenderness left first MPJ Slight tenderness on range of motion left first MPJ No edema or erythema or warmth left first MPJ  X-ray examination foot dated 05/02/2017  Intact bony structure within x-ray in or dislocation There is no increased soft tissue density HAV with slight narrowing of first MPJ joint space Hammertoe second Posterior and inferior calcaneal spurs  Radiographic impression: No acute bony abnormality noted weightbearing x-ray of the left foot dated 05/02/2017  Lab results dated 05/02/2017: Elevated serum uric acid 12.0 MG/DL Sedimentation rate within normal limits WBC within normal limits Decrease hemoglobin and hematocrit        Assessment & Plan:   Assessment: Resolving gouty arthritis left first MPJ Diabetic with peripheral vascular disease Diabetic with peripheral neuropathy  Plan: Today I reviewed  the results of the lab and informed patient that she had a gout episode. At this time we discussed follow-up for these symptoms. Patient has a pending appointment with an internist or family practitioner in September 2018. I instructed her to inform her physician that she did have a gout episode which is resolved at this time Return as needed Patient is pending appointment for debridement of upper trophic nails

## 2017-05-09 NOTE — Addendum Note (Signed)
Addended by: Gean Birchwood on: 05/09/2017 12:08 PM   Modules accepted: Level of Service

## 2017-05-11 ENCOUNTER — Encounter: Payer: Self-pay | Admitting: Obstetrics & Gynecology

## 2017-05-11 ENCOUNTER — Ambulatory Visit (INDEPENDENT_AMBULATORY_CARE_PROVIDER_SITE_OTHER): Payer: Medicaid Other | Admitting: Obstetrics & Gynecology

## 2017-05-11 VITALS — BP 121/44 | HR 57 | Ht 61.0 in | Wt 265.9 lb

## 2017-05-11 DIAGNOSIS — N950 Postmenopausal bleeding: Secondary | ICD-10-CM | POA: Diagnosis present

## 2017-05-11 DIAGNOSIS — D251 Intramural leiomyoma of uterus: Secondary | ICD-10-CM | POA: Diagnosis not present

## 2017-05-11 NOTE — Progress Notes (Signed)
   Subjective:    Patient ID: [name], female    DOB: [birth year], 61 y.o.   MRN: [id number]  HPI 61 yo AA lady here for follow up after a EMBX done for zPMB and a thickened endometrium seen on u/s. Her pathology was normal.  Review of Systems Her u/s showed the following: Large probable exophytic leiomyoma at uterine fundus 8.6 cm greatest size.     Objective:   Physical Exam Well nourished, well hydrated black female, no apparent distress Breathing, conversing normally She is walking with a cane, last visit in a wheelchair.       Assessment & Plan:  PMB- no evidence of endometrial cancer, probably due to her fibroid She is not a good surgical candidate so I have suggested depo Lupron. We will call her when the shot is in stock.

## 2017-05-14 ENCOUNTER — Ambulatory Visit: Payer: Medicaid Other | Admitting: Pulmonary Disease

## 2017-05-14 NOTE — Telephone Encounter (Signed)
Patient called to say she has been denied her CPAP because her previous DME submitted an order for a CPAP that she never received because she has an outstanding balance with them.  Patients new DME is [facility] and they tried to submit a CPAP order but was denied due to an already standing order. Patient will call her insurance company to try to find a solution for this problem.

## 2017-05-15 ENCOUNTER — Other Ambulatory Visit (HOSPITAL_COMMUNITY): Payer: Self-pay

## 2017-05-15 NOTE — Progress Notes (Signed)
Paramedicine Encounter    Patient ID: [name], female    DOB: July 17, 1956, 61 y.o.   MRN: [id number]    Patient Care Team: Javier Docker, MD as PCP - General (Internal Medicine)  Patient Active Problem List   Diagnosis Date Noted  . Hypertensive heart and renal disease   . SOB (shortness of breath)   . Chronic diastolic heart failure ([place]) 11/30/2016  . CKD (chronic kidney disease) stage 3, GFR 30-59 ml/min 11/30/2016  . Thrombocytopenia ([place]) 11/30/2016  . Sinus bradycardia 11/30/2016  . PMB (postmenopausal bleeding) 05/09/2010  . ANEMIA 05/05/2010  . DEPRESSION 04/26/2010  . TMJ SYNDROME 04/26/2010  . DEGENERATIVE DISC DISEASE, CERVICAL SPINE 04/26/2010  . CHEST PAIN 04/26/2010  . CHRONIC OBSTRUCTIVE PULMONARY DISEASE, MODERATE 03/26/2010  . MASTALGIA 10/08/2009  . Iron deficiency anemia 07/12/2009  . Diabetes mellitus ([place]) 07/02/2009  . Hyperlipidemia 07/02/2009  . BMI 45.0-49.9, adult ([place]) 07/02/2009  . OSA (obstructive sleep apnea) 07/02/2009  . Glaucoma 07/02/2009  . Hypertension 07/02/2009  . ASTHMA 07/02/2009  . LOW BACK PAIN SYNDROME 07/02/2009  . BACK PAIN 07/02/2009  . PERIPHERAL EDEMA 07/02/2009  . GROIN PAIN 07/02/2009  . TOBACCO USE, QUIT 07/02/2009  . HEMORRHOIDS, INTERNAL 07/16/2008  . DIVERTICULOSIS OF COLON 07/16/2008    Current Outpatient Prescriptions:  .  amLODipine (NORVASC) 5 MG tablet, TAKE 1 TABLET BY MOUTH EVERY DAY, Disp: 90 tablet, Rfl: 3 .  doxazosin (CARDURA) 4 MG tablet, Take 4 mg by mouth at bedtime., Disp: , Rfl:  .  insulin aspart (NOVOLOG) 100 UNIT/ML injection, Inject into the skin 3 (three) times daily before meals. Per sliding scale, Disp: , Rfl:  .  insulin glargine (LANTUS) 100 UNIT/ML injection, Inject 50 Units into the skin at bedtime., Disp: , Rfl:  .  potassium chloride SA (K-DUR,KLOR-CON) 20 MEQ tablet, Take 20 mEq by mouth every other day. , Disp: , Rfl: 6 .  torsemide (DEMADEX) 20 MG tablet, Take 3 tablets (60  mg total) by mouth as directed. Take 60 mg in the AM. You may take 60 mg in the PM as needed for swelling, Disp: 180 tablet, Rfl: 3 .  acetaminophen (TYLENOL) 500 MG tablet, Take 1,000 mg by mouth every 6 (six) hours as needed for mild pain. , Disp: , Rfl:  .  acetaminophen-codeine (TYLENOL #3) 300-30 MG tablet, Take 1 tablet by mouth every 6 (six) hours as needed for moderate pain., Disp: , Rfl:  .  albuterol (PROVENTIL HFA;VENTOLIN HFA) 108 (90 BASE) MCG/ACT inhaler, Inhale 2 puffs into the lungs every 6 (six) hours as needed for wheezing or shortness of breath. For shortness of breath, Disp: , Rfl:  .  dorzolamide-timolol (COSOPT) 22.3-6.8 MG/ML ophthalmic solution, Place 1 drop into both eyes 2 (two) times daily., Disp: , Rfl:  .  misoprostol (CYTOTEC) 200 MCG tablet, Take 3 pills by mouth the night before biopsy. (Patient not taking: Reported on 05/08/2017), Disp: 3 tablet, Rfl: 0 .  predniSONE (STERAPRED UNI-PAK 21 TAB) 10 MG (21) TBPK tablet, Tapering 6 days dose pack, Disp: 21 tablet, Rfl: 0 .  Travoprost, BAK Free, (TRAVATAN) 0.004 % SOLN ophthalmic solution, Place 1 drop into both eyes at bedtime., Disp: , Rfl:  Allergies  Allergen Reactions  . Pork-Derived Products Nausea And Vomiting     Social History   Social History  . Marital status: Single    Spouse name: N/A  . Number of children: N/A  . Years of education: N/A   Occupational History  .  Not on file.   Social History Main Topics  . Smoking status: Former Smoker    Types: Cigarettes    Quit date: 09/04/1998  . Smokeless tobacco: Never Used  . Alcohol use Yes     Comment: occasional  . Drug use: No  . Sexual activity: Yes    Birth control/ protection: None   Other Topics Concern  . Not on file   Social History Narrative  . No narrative on file    Physical Exam  Pulmonary/Chest: No respiratory distress. She has no wheezes. She has no rales.  Abdominal: She exhibits no distension. There is no tenderness. There  is no guarding.  Musculoskeletal: She exhibits edema.  Edema in both lower legs/ both feet  Skin: Skin is warm and dry. She is not diaphoretic.        Future Appointments Date Time Provider [place]  05/22/2017 3:30 PM Marshell Garfinkel, MD [facility] None  05/24/2017 2:30 PM [facility] PA/NP [facility] None  06/11/2017 1:30 PM Tuchman, Leslye Peer, DPM [facility]    ATF pt CAO x4 with no complaints. Ob-gyn stated that pt can get shots to shrink the fibroid cysts.  Pt has gained 7 lbs in a week; she stated that "she hasn't eaten anything with sodium and she hasn't been drinking a lot of fluids".  Pt also stated that she has an increase in sob within the past week. She has taken all of her meds without difficulty.  rx bottles verified and pill box refilled.      Susie advised pt take an extra 60mg  torsemide tonight and an exra 60 mg tomorrow if she still have swelling in her legs and feet.  **rx called in: potassium  BP (!) 142/70 (BP Location: Left Arm, Patient Position: Sitting)   Pulse (!) 53   Wt 269 lb 6.4 oz (122.2 kg)   LMP 02/16/2017 (Within Weeks)   SpO2 99%   BMI 50.90 kg/m  cbg 180  Weight yesterday-270 Last visit weight-263    [name], EMT Paramedic 05/15/2017    ACTION: Home visit completed Next visit planned for next week

## 2017-05-16 ENCOUNTER — Telehealth (HOSPITAL_COMMUNITY): Payer: Self-pay | Admitting: Vascular Surgery

## 2017-05-16 NOTE — Telephone Encounter (Signed)
Left pt message to move appt to a morning due to schedule changes

## 2017-05-22 ENCOUNTER — Ambulatory Visit: Payer: Medicaid Other | Admitting: Pulmonary Disease

## 2017-05-22 ENCOUNTER — Other Ambulatory Visit (HOSPITAL_COMMUNITY): Payer: Self-pay

## 2017-05-22 NOTE — Progress Notes (Signed)
Paramedicine Encounter    Patient ID: [name], female    DOB: 11-20-1955, 61 y.o.   MRN: [id number]    Patient Care Team: Javier Docker, MD as PCP - General (Internal Medicine)  Patient Active Problem List   Diagnosis Date Noted  . Hypertensive heart and renal disease   . SOB (shortness of breath)   . Chronic diastolic heart failure ([place]) 11/30/2016  . CKD (chronic kidney disease) stage 3, GFR 30-59 ml/min 11/30/2016  . Thrombocytopenia ([place]) 11/30/2016  . Sinus bradycardia 11/30/2016  . PMB (postmenopausal bleeding) 05/09/2010  . ANEMIA 05/05/2010  . DEPRESSION 04/26/2010  . TMJ SYNDROME 04/26/2010  . DEGENERATIVE DISC DISEASE, CERVICAL SPINE 04/26/2010  . CHEST PAIN 04/26/2010  . CHRONIC OBSTRUCTIVE PULMONARY DISEASE, MODERATE 03/26/2010  . MASTALGIA 10/08/2009  . Iron deficiency anemia 07/12/2009  . Diabetes mellitus ([place]) 07/02/2009  . Hyperlipidemia 07/02/2009  . BMI 45.0-49.9, adult ([place]) 07/02/2009  . OSA (obstructive sleep apnea) 07/02/2009  . Glaucoma 07/02/2009  . Hypertension 07/02/2009  . ASTHMA 07/02/2009  . LOW BACK PAIN SYNDROME 07/02/2009  . BACK PAIN 07/02/2009  . PERIPHERAL EDEMA 07/02/2009  . GROIN PAIN 07/02/2009  . TOBACCO USE, QUIT 07/02/2009  . HEMORRHOIDS, INTERNAL 07/16/2008  . DIVERTICULOSIS OF COLON 07/16/2008    Current Outpatient Prescriptions:  .  amLODipine (NORVASC) 5 MG tablet, TAKE 1 TABLET BY MOUTH EVERY DAY, Disp: 90 tablet, Rfl: 3 .  doxazosin (CARDURA) 4 MG tablet, Take 4 mg by mouth at bedtime., Disp: , Rfl:  .  potassium chloride SA (K-DUR,KLOR-CON) 20 MEQ tablet, Take 20 mEq by mouth every other day. , Disp: , Rfl: 6 .  torsemide (DEMADEX) 20 MG tablet, Take 3 tablets (60 mg total) by mouth as directed. Take 60 mg in the AM. You may take 60 mg in the PM as needed for swelling, Disp: 180 tablet, Rfl: 3 .  acetaminophen (TYLENOL) 500 MG tablet, Take 1,000 mg by mouth every 6 (six) hours as needed for mild pain. , Disp: ,  Rfl:  .  acetaminophen-codeine (TYLENOL #3) 300-30 MG tablet, Take 1 tablet by mouth every 6 (six) hours as needed for moderate pain., Disp: , Rfl:  .  albuterol (PROVENTIL HFA;VENTOLIN HFA) 108 (90 BASE) MCG/ACT inhaler, Inhale 2 puffs into the lungs every 6 (six) hours as needed for wheezing or shortness of breath. For shortness of breath, Disp: , Rfl:  .  dorzolamide-timolol (COSOPT) 22.3-6.8 MG/ML ophthalmic solution, Place 1 drop into both eyes 2 (two) times daily., Disp: , Rfl:  .  insulin aspart (NOVOLOG) 100 UNIT/ML injection, Inject into the skin 3 (three) times daily before meals. Per sliding scale, Disp: , Rfl:  .  insulin glargine (LANTUS) 100 UNIT/ML injection, Inject 50 Units into the skin at bedtime., Disp: , Rfl:  .  misoprostol (CYTOTEC) 200 MCG tablet, Take 3 pills by mouth the night before biopsy. (Patient not taking: Reported on 05/08/2017), Disp: 3 tablet, Rfl: 0 .  predniSONE (STERAPRED UNI-PAK 21 TAB) 10 MG (21) TBPK tablet, Tapering 6 days dose pack, Disp: 21 tablet, Rfl: 0 .  Travoprost, BAK Free, (TRAVATAN) 0.004 % SOLN ophthalmic solution, Place 1 drop into both eyes at bedtime., Disp: , Rfl:  Allergies  Allergen Reactions  . Pork-Derived Products Nausea And Vomiting     Social History   Social History  . Marital status: Single    Spouse name: N/A  . Number of children: N/A  . Years of education: N/A   Occupational History  .  Not on file.   Social History Main Topics  . Smoking status: Former Smoker    Types: Cigarettes    Quit date: 09/04/1998  . Smokeless tobacco: Never Used  . Alcohol use Yes     Comment: occasional  . Drug use: No  . Sexual activity: Yes    Birth control/ protection: None   Other Topics Concern  . Not on file   Social History Narrative  . No narrative on file    Physical Exam  Pulmonary/Chest: No respiratory distress. She has no wheezes. She has no rales.  Abdominal: She exhibits no distension. There is no tenderness. There  is no guarding.  Musculoskeletal: She exhibits edema.  Skin: Skin is warm and dry. She is not diaphoretic.        Future Appointments Date Time Provider [place]  05/24/2017 9:30 AM [facility] PA/NP [facility] None  06/11/2017 1:30 PM Tuchman, Leslye Peer, DPM [facility]    ATF pt CAO x4 sitting in her recliner sleeping. Pt went to see her PCP today and was advised that her kidney function is good which she was releaved.  Pt has taken all of her meds this week and hasn't missed a dose.  Pt has no complaints today. She denies sob, dizziness, headache and chest pain.  rx bottles verified and pill box refilled.    cbg 213  BP (!) 156/70 (BP Location: Left Arm, Patient Position: Sitting, Cuff Size: Large)   Pulse (!) 59   Resp 16   Wt 270 lb (122.5 kg)   LMP 02/16/2017 (Within Weeks)   SpO2 98%   BMI 51.02 kg/m   Weight yesterday-269 Last visit weight-269    Antwain Caliendo, EMT Paramedic 05/22/2017    ACTION: Home visit completed Next visit planned for thurs. clinic appt

## 2017-05-24 ENCOUNTER — Other Ambulatory Visit (HOSPITAL_COMMUNITY): Payer: Self-pay

## 2017-05-24 ENCOUNTER — Ambulatory Visit (HOSPITAL_COMMUNITY)
Admission: RE | Admit: 2017-05-24 | Discharge: 2017-05-24 | Disposition: A | Discharge status: Home / Self Care | Payer: Medicaid Other | Source: Ambulatory Visit | Attending: Internal Medicine | Admitting: Internal Medicine

## 2017-05-24 ENCOUNTER — Encounter (HOSPITAL_COMMUNITY): Payer: Self-pay

## 2017-05-24 VITALS — BP 136/68 | HR 51 | Wt 271.8 lb

## 2017-05-24 DIAGNOSIS — F419 Anxiety disorder, unspecified: Secondary | ICD-10-CM | POA: Insufficient documentation

## 2017-05-24 DIAGNOSIS — E1122 Type 2 diabetes mellitus with diabetic chronic kidney disease: Secondary | ICD-10-CM | POA: Insufficient documentation

## 2017-05-24 DIAGNOSIS — Z736 Limitation of activities due to disability: Secondary | ICD-10-CM | POA: Insufficient documentation

## 2017-05-24 DIAGNOSIS — N183 Chronic kidney disease, stage 3 unspecified: Secondary | ICD-10-CM

## 2017-05-24 DIAGNOSIS — I130 Hypertensive heart and chronic kidney disease with heart failure and stage 1 through stage 4 chronic kidney disease, or unspecified chronic kidney disease: Secondary | ICD-10-CM | POA: Insufficient documentation

## 2017-05-24 DIAGNOSIS — R001 Bradycardia, unspecified: Secondary | ICD-10-CM | POA: Diagnosis present

## 2017-05-24 DIAGNOSIS — I10 Essential (primary) hypertension: Secondary | ICD-10-CM

## 2017-05-24 DIAGNOSIS — R51 Headache: Secondary | ICD-10-CM | POA: Insufficient documentation

## 2017-05-24 DIAGNOSIS — M7989 Other specified soft tissue disorders: Secondary | ICD-10-CM | POA: Insufficient documentation

## 2017-05-24 DIAGNOSIS — G4733 Obstructive sleep apnea (adult) (pediatric): Secondary | ICD-10-CM

## 2017-05-24 DIAGNOSIS — E1140 Type 2 diabetes mellitus with diabetic neuropathy, unspecified: Secondary | ICD-10-CM | POA: Diagnosis not present

## 2017-05-24 DIAGNOSIS — I5032 Chronic diastolic (congestive) heart failure: Secondary | ICD-10-CM | POA: Diagnosis not present

## 2017-05-24 DIAGNOSIS — E785 Hyperlipidemia, unspecified: Secondary | ICD-10-CM | POA: Diagnosis not present

## 2017-05-24 DIAGNOSIS — Z794 Long term (current) use of insulin: Secondary | ICD-10-CM

## 2017-05-24 DIAGNOSIS — G8929 Other chronic pain: Secondary | ICD-10-CM | POA: Insufficient documentation

## 2017-05-24 DIAGNOSIS — J45909 Unspecified asthma, uncomplicated: Secondary | ICD-10-CM | POA: Diagnosis not present

## 2017-05-24 DIAGNOSIS — F329 Major depressive disorder, single episode, unspecified: Secondary | ICD-10-CM | POA: Insufficient documentation

## 2017-05-24 DIAGNOSIS — Z6841 Body Mass Index (BMI) 40.0 and over, adult: Secondary | ICD-10-CM | POA: Diagnosis not present

## 2017-05-24 DIAGNOSIS — M5440 Lumbago with sciatica, unspecified side: Secondary | ICD-10-CM | POA: Insufficient documentation

## 2017-05-24 DIAGNOSIS — R0602 Shortness of breath: Secondary | ICD-10-CM | POA: Diagnosis not present

## 2017-05-24 DIAGNOSIS — Z87891 Personal history of nicotine dependence: Secondary | ICD-10-CM | POA: Diagnosis not present

## 2017-05-24 DIAGNOSIS — H409 Unspecified glaucoma: Secondary | ICD-10-CM | POA: Insufficient documentation

## 2017-05-24 DIAGNOSIS — Z79899 Other long term (current) drug therapy: Secondary | ICD-10-CM | POA: Diagnosis not present

## 2017-05-24 LAB — BASIC METABOLIC PANEL
Anion gap: 9 (ref 5–15)
BUN: 28 mg/dL — AB (ref 6–20)
CALCIUM: 9 mg/dL (ref 8.9–10.3)
CO2: 28 mmol/L (ref 22–32)
CREATININE: 1.53 mg/dL — AB (ref 0.44–1.00)
Chloride: 104 mmol/L (ref 101–111)
GFR calc Af Amer: 42 mL/min — ABNORMAL LOW (ref >60)
GFR, EST NON AFRICAN AMERICAN: 36 mL/min — AB (ref >60)
Glucose, Bld: 64 mg/dL — ABNORMAL LOW (ref 65–99)
Potassium: 4.1 mmol/L (ref 3.5–5.1)
Sodium: 141 mmol/L (ref 135–145)

## 2017-05-24 MED ORDER — POTASSIUM CHLORIDE CRYS ER 20 MEQ PO TBCR: 20.0000 meq | EXTENDED_RELEASE_TABLET | Freq: Every day | ORAL | 6 refills | Status: DC

## 2017-05-24 MED ORDER — METOLAZONE 2.5 MG PO TABS: 2.5000 mg | ORAL_TABLET | ORAL | 1 refills | Status: DC | PRN

## 2017-05-24 MED ORDER — TORSEMIDE 20 MG PO TABS: 60.0000 mg | ORAL_TABLET | Freq: Two times a day (BID) | ORAL | 3 refills | Status: DC

## 2017-05-24 NOTE — Progress Notes (Signed)
Paramedicine Encounter   Patient ID: [name] , female,   DOB: 04-20-1956,61 y.o.,  MRN: [id number]   Met patient in clinic today with provider Jonni Sanger.  Pt c/o being nauseated this am after she ate potatoe salad this morning.  She took her blood pressure meds this am but she didn't take torsemide yet. The CPAP machine will be delivered next week.   Jonni Sanger talked to her about diet and fluid intake.    *increase torsemide to one in the evening also take metolazone two days (today and tomorrow morning) Take potassium daily and I extra one for today and tomorrow. Time spent with patient 29 mins  Laquana Villari EMT-Paramedic 05/24/2017   ACTION: Next visit planned for tuesday

## 2017-05-24 NOTE — Patient Instructions (Signed)
Labs today (will call for abnormal results, otherwise no news is good news)  TAKE Metolazone 2.5 mg (1 Tablet) for 2 days only.  Take an extra 20 mEq of potassium these 2 days.  INCREASE Torsemide to 60 mg (3 Tablets) Twice Daily  INCREASE Potassium to 20 mEq (1 Tablet) Once Daily  Follow up in 1 Week.

## 2017-05-24 NOTE — Progress Notes (Signed)
Advanced Heart Failure Clinic Note   Referring Physician: Dr. Johnsie Cancel Primary Care: [name]  Primary Cardiologist: Dr. Johnsie Cancel HF: Dr. Aundra Dubin  HPI:  [name] is a 61 y.o. female with PMH Chronic diastolic CHF, OSA, Morbid obesity, DM2, HTN, CKD, and sinus bradycardia.   Pt referred from Dr. Johnsie Cancel for evaluation by AHF team.   Admitted 11/2016 with CHF. Echo and RHC as below. Normal LVEF with grade 2 DD. RHC with normal pressures s/p diuresis. Discharge weight 246 lbs.   Seen by Dr. Johnsie Cancel 01/18/2017. Switched from lasix 40 mg daily to torsemide 20 mg BID with bloating and weight gain. Pt weight at that visit 252 lbs ( 6 lbs over 2 months)  She returns today for regular follow up. Weight up 8 lbs. Having a lot of swelling in her legs. SOB with changing clothes and bathing. + orthopnea. Getting her CPAP next week. States she drinking < 2 L and watching her salt, but paramedicine says she has a lot of empty water bottles any time she comes to visit her.    Social: On disability. Had back surgery 08/2014 and has ongoing chronic back pain. Lives at home with roommate. Daughter lives in [place].   Review of systems complete and found to be negative unless listed in HPI.    Echo 12/02/16 LVEF 60-65%, Grade 2 DD, Trivial TR, Trivial PI. Normal RV.   [name] 12/06/16: Hemodynamics (mmHg) RA mean 6 RV 48/7 PA 45/14 (mean 23) PCWP 8 PVR 2.7 WU AO 99% Cardiac Output (Fick) 5.6 Cardiac Index (Fick) 2.72  Past Medical History:  Diagnosis Date  . Achilles rupture   . Anxiety   . Arthritis   . Asthma   . Chronic back pain   . Chronic headaches   . Chronic leg pain    bilaterally  . Depression   . Diabetes mellitus 1999   T2DM  . Glaucoma   . H/O gestational diabetes mellitus, not currently pregnant   . Hyperlipidemia   . Hypertension   . Morbid obesity ([place])   . OSA (obstructive sleep apnea)   . Sciatica     Current Outpatient Prescriptions  Medication Sig Dispense  Refill  . acetaminophen (TYLENOL) 500 MG tablet Take 1,000 mg by mouth every 6 (six) hours as needed for mild pain.     Marland Kitchen acetaminophen-codeine (TYLENOL #3) 300-30 MG tablet Take 1 tablet by mouth every 6 (six) hours as needed for moderate pain.    Marland Kitchen albuterol (PROVENTIL HFA;VENTOLIN HFA) 108 (90 BASE) MCG/ACT inhaler Inhale 2 puffs into the lungs every 6 (six) hours as needed for wheezing or shortness of breath. For shortness of breath    . amLODipine (NORVASC) 5 MG tablet TAKE 1 TABLET BY MOUTH EVERY DAY 90 tablet 3  . dorzolamide-timolol (COSOPT) 22.3-6.8 MG/ML ophthalmic solution Place 1 drop into both eyes 2 (two) times daily.    Marland Kitchen doxazosin (CARDURA) 4 MG tablet Take 4 mg by mouth at bedtime.    . insulin aspart (NOVOLOG) 100 UNIT/ML injection Inject into the skin 3 (three) times daily before meals. Per sliding scale    . insulin glargine (LANTUS) 100 UNIT/ML injection Inject 50 Units into the skin at bedtime.    . misoprostol (CYTOTEC) 200 MCG tablet Take 3 pills by mouth the night before biopsy. 3 tablet 0  . potassium chloride SA (K-DUR,KLOR-CON) 20 MEQ tablet Take 20 mEq by mouth every other day.   6  . torsemide (DEMADEX) 20 MG tablet Take  3 tablets (60 mg total) by mouth as directed. Take 60 mg in the AM. You may take 60 mg in the PM as needed for swelling 180 tablet 3  . Travoprost, BAK Free, (TRAVATAN) 0.004 % SOLN ophthalmic solution Place 1 drop into both eyes at bedtime.     No current facility-administered medications for this encounter.     Allergies  Allergen Reactions  . Pork-Derived Products Nausea And Vomiting     Social History   Social History  . Marital status: Single    Spouse name: N/A  . Number of children: N/A  . Years of education: N/A   Occupational History  . Not on file.   Social History Main Topics  . Smoking status: Former Smoker    Types: Cigarettes    Quit date: 09/04/1998  . Smokeless tobacco: Never Used  . Alcohol use Yes     Comment:  occasional  . Drug use: No  . Sexual activity: Yes    Birth control/ protection: None   Other Topics Concern  . Not on file   Social History Narrative  . No narrative on file      Family History  Problem Relation Age of Onset  . Heart failure Mother   . Renal Disease Mother     Vitals:   05/24/17 0921  BP: 136/68  Pulse: (!) 51  SpO2: 98%  Weight: 271 lb 12.8 oz (123.3 kg)   Wt Readings from Last 3 Encounters:  05/24/17 271 lb 12.8 oz (123.3 kg)  05/22/17 270 lb (122.5 kg)  05/15/17 269 lb 6.4 oz (122.2 kg)    PHYSICAL EXAM: General: Obese female. NAD. Arrived in [facility] HEENT: Normal Neck: Supple. JVP difficult to assess due to body habitus. Carotids 2+ bilat; no bruits. No thyromegaly or nodule noted. Cor: PMI nondisplaced. RRR, No M/G/R noted Lungs: CTAB, normal effort. Abdomen: Soft, non-tender, non-distended, no HSM. No bruits or masses. +BS  Extremities: No cyanosis, clubbing, or rash. 2-3+ edema to knees. Trace to 1+ edema into thighs.   Neuro: Alert & orientedx3, cranial nerves grossly intact. moves all 4 extremities w/o difficulty. Affect pleasant   ASSESSMENT & PLAN:  1. Chronic diastolic CHF: Echo 0/2542 EF 60-65%, Grade 2 DD, trivial TR, normal RV.  - NYHA IIIb symptoms.  - Volume status markedly elevated - Offered IV lasix but she refused.  - Increase torsemide 60 mg BID and give metolazone 2.5 mg for the next two days.  - Increase potassium to 20 meq daily. Take extra 20 meq with metolazone.  - Paramedicine present.  - We talked about dietary restrictions, specifically eating out. Told her to avoid fast food, eating out and high sodium foods.  - Reinforced fluid restriction to < 2 L daily, sodium restriction to less than 2000 mg daily, and the importance of daily weights.   - Suspect that her dyspnea is multifactorial and is out of proportion to her volume status.   2. CKD Stage III - BMET today. Repeat next week.   - She tells me that her PCP is  recommending a renal ultrasound.   3. Morbid obesity - Body mass index is 51.36 kg/m. - Encouraged to increase activity and watch portion sizes.   4. HTN - Will not increase today with diuresis.   5. Sinus bradycardia - Stable. No BB.   6. OSA - Had sleep study in May 2018 - Has not received CPAP yet. She gets it next week.   7. DM2 -  Insulin dependent.  - Follows with PCP.   Diuretics as above. Follow up 1 week. Labs today.   Shirley Friar, PA-C 05/24/17   Greater than 50% of the 25 minute visit was spent in counseling/coordination of care regarding disease state educations, salt/fluid restriction, medication restriction, and sliding scale diuretics.

## 2017-05-29 ENCOUNTER — Ambulatory Visit: Payer: Medicaid Other | Admitting: Podiatry

## 2017-05-29 ENCOUNTER — Other Ambulatory Visit (HOSPITAL_COMMUNITY): Payer: Self-pay

## 2017-05-29 NOTE — Progress Notes (Signed)
Paramedicine Encounter    Patient ID: [name], female    DOB: Jul 09, 1956, 61 y.o.   MRN: [id number]    Patient Care Team: Javier Docker, MD as PCP - General (Internal Medicine)  Patient Active Problem List   Diagnosis Date Noted  . Hypertensive heart and renal disease   . SOB (shortness of breath)   . Chronic diastolic heart failure ([place]) 11/30/2016  . CKD (chronic kidney disease) stage 3, GFR 30-59 ml/min 11/30/2016  . Thrombocytopenia ([place]) 11/30/2016  . Sinus bradycardia 11/30/2016  . PMB (postmenopausal bleeding) 05/09/2010  . ANEMIA 05/05/2010  . DEPRESSION 04/26/2010  . TMJ SYNDROME 04/26/2010  . DEGENERATIVE DISC DISEASE, CERVICAL SPINE 04/26/2010  . CHEST PAIN 04/26/2010  . CHRONIC OBSTRUCTIVE PULMONARY DISEASE, MODERATE 03/26/2010  . MASTALGIA 10/08/2009  . Iron deficiency anemia 07/12/2009  . Diabetes mellitus ([place]) 07/02/2009  . Hyperlipidemia 07/02/2009  . BMI 45.0-49.9, adult ([place]) 07/02/2009  . OSA (obstructive sleep apnea) 07/02/2009  . Glaucoma 07/02/2009  . Hypertension 07/02/2009  . ASTHMA 07/02/2009  . LOW BACK PAIN SYNDROME 07/02/2009  . BACK PAIN 07/02/2009  . PERIPHERAL EDEMA 07/02/2009  . GROIN PAIN 07/02/2009  . TOBACCO USE, QUIT 07/02/2009  . HEMORRHOIDS, INTERNAL 07/16/2008  . DIVERTICULOSIS OF COLON 07/16/2008    Current Outpatient Prescriptions:  .  amLODipine (NORVASC) 5 MG tablet, TAKE 1 TABLET BY MOUTH EVERY DAY, Disp: 90 tablet, Rfl: 3 .  doxazosin (CARDURA) 4 MG tablet, Take 4 mg by mouth at bedtime., Disp: , Rfl:  .  potassium chloride SA (K-DUR,KLOR-CON) 20 MEQ tablet, Take 1 tablet (20 mEq total) by mouth daily., Disp: 30 tablet, Rfl: 6 .  torsemide (DEMADEX) 20 MG tablet, Take 3 tablets (60 mg total) by mouth 2 (two) times daily., Disp: 180 tablet, Rfl: 3 .  acetaminophen (TYLENOL) 500 MG tablet, Take 1,000 mg by mouth every 6 (six) hours as needed for mild pain. , Disp: , Rfl:  .  acetaminophen-codeine (TYLENOL #3) 300-30  MG tablet, Take 1 tablet by mouth every 6 (six) hours as needed for moderate pain., Disp: , Rfl:  .  albuterol (PROVENTIL HFA;VENTOLIN HFA) 108 (90 BASE) MCG/ACT inhaler, Inhale 2 puffs into the lungs every 6 (six) hours as needed for wheezing or shortness of breath. For shortness of breath, Disp: , Rfl:  .  dorzolamide-timolol (COSOPT) 22.3-6.8 MG/ML ophthalmic solution, Place 1 drop into both eyes 2 (two) times daily., Disp: , Rfl:  .  insulin aspart (NOVOLOG) 100 UNIT/ML injection, Inject into the skin 3 (three) times daily before meals. Per sliding scale, Disp: , Rfl:  .  insulin glargine (LANTUS) 100 UNIT/ML injection, Inject 50 Units into the skin at bedtime., Disp: , Rfl:  .  metolazone (ZAROXOLYN) 2.5 MG tablet, Take 1 tablet (2.5 mg total) by mouth as needed. Take only as directed., Disp: 5 tablet, Rfl: 1 .  misoprostol (CYTOTEC) 200 MCG tablet, Take 3 pills by mouth the night before biopsy., Disp: 3 tablet, Rfl: 0 .  Travoprost, BAK Free, (TRAVATAN) 0.004 % SOLN ophthalmic solution, Place 1 drop into both eyes at bedtime., Disp: , Rfl:  Allergies  Allergen Reactions  . Pork-Derived Products Nausea And Vomiting     Social History   Social History  . Marital status: Single    Spouse name: N/A  . Number of children: N/A  . Years of education: N/A   Occupational History  . Not on file.   Social History Main Topics  . Smoking status: Former Smoker  Types: Cigarettes    Quit date: 09/04/1998  . Smokeless tobacco: Never Used  . Alcohol use Yes     Comment: occasional  . Drug use: No  . Sexual activity: Yes    Birth control/ protection: None   Other Topics Concern  . Not on file   Social History Narrative  . No narrative on file    Physical Exam  Pulmonary/Chest: No respiratory distress. She has no wheezes. She has no rales.  Abdominal: She exhibits no distension. There is no tenderness. There is no rebound and no guarding.  Musculoskeletal: She exhibits edema.   Edema present in both lower legs and feet  Skin: Skin is warm and dry. She is not diaphoretic.        Future Appointments Date Time Provider [place]  06/01/2017 10:30 AM [facility] PA/NP [facility] None  06/11/2017 1:30 PM Tuchman, Leslye Peer, DPM [facility]    ATF pt CAO x4 sitting at the kitchen table food preparing  Her dinner.  Pt is using fresh vegetables and mrs. Dash to prepare chilli.  Pt stated that she feels a lot better today.  She was relieved because she has lost 6 lbs since Friday due to the medication change.  Pt denies sob, dizziness, and lightheadedness.  Pt has taken all of her medications without difficulty.  rx bottles verified and pill box refilled.   BP (!) 160/92   Pulse 72   Resp 16   Wt 263 lb (119.3 kg)   LMP 02/16/2017 (Within Weeks)   SpO2 98%   BMI 49.69 kg/m   cbg 271  Weight yesterday-264 Last visit weight-270    [name], EMT Paramedic 05/30/2017    ACTION: Home visit completed

## 2017-06-01 ENCOUNTER — Ambulatory Visit (HOSPITAL_COMMUNITY)
Admission: RE | Admit: 2017-06-01 | Discharge: 2017-06-01 | Disposition: A | Discharge status: Home / Self Care | Payer: Medicaid Other | Source: Ambulatory Visit | Attending: Internal Medicine | Admitting: Internal Medicine

## 2017-06-01 ENCOUNTER — Encounter (HOSPITAL_COMMUNITY): Payer: Self-pay

## 2017-06-01 VITALS — BP 145/58 | HR 55 | Wt 266.8 lb

## 2017-06-01 DIAGNOSIS — E1122 Type 2 diabetes mellitus with diabetic chronic kidney disease: Secondary | ICD-10-CM | POA: Diagnosis not present

## 2017-06-01 DIAGNOSIS — I10 Essential (primary) hypertension: Secondary | ICD-10-CM | POA: Diagnosis not present

## 2017-06-01 DIAGNOSIS — N183 Chronic kidney disease, stage 3 (moderate): Secondary | ICD-10-CM | POA: Insufficient documentation

## 2017-06-01 DIAGNOSIS — G4733 Obstructive sleep apnea (adult) (pediatric): Secondary | ICD-10-CM

## 2017-06-01 DIAGNOSIS — Z87891 Personal history of nicotine dependence: Secondary | ICD-10-CM | POA: Insufficient documentation

## 2017-06-01 DIAGNOSIS — F419 Anxiety disorder, unspecified: Secondary | ICD-10-CM | POA: Diagnosis not present

## 2017-06-01 DIAGNOSIS — Z794 Long term (current) use of insulin: Secondary | ICD-10-CM | POA: Diagnosis not present

## 2017-06-01 DIAGNOSIS — Z6841 Body Mass Index (BMI) 40.0 and over, adult: Secondary | ICD-10-CM | POA: Diagnosis not present

## 2017-06-01 DIAGNOSIS — J45909 Unspecified asthma, uncomplicated: Secondary | ICD-10-CM | POA: Diagnosis not present

## 2017-06-01 DIAGNOSIS — M549 Dorsalgia, unspecified: Secondary | ICD-10-CM | POA: Insufficient documentation

## 2017-06-01 DIAGNOSIS — F329 Major depressive disorder, single episode, unspecified: Secondary | ICD-10-CM | POA: Diagnosis not present

## 2017-06-01 DIAGNOSIS — E785 Hyperlipidemia, unspecified: Secondary | ICD-10-CM | POA: Diagnosis not present

## 2017-06-01 DIAGNOSIS — I5032 Chronic diastolic (congestive) heart failure: Secondary | ICD-10-CM | POA: Diagnosis not present

## 2017-06-01 DIAGNOSIS — R001 Bradycardia, unspecified: Secondary | ICD-10-CM | POA: Diagnosis not present

## 2017-06-01 DIAGNOSIS — H409 Unspecified glaucoma: Secondary | ICD-10-CM | POA: Diagnosis not present

## 2017-06-01 DIAGNOSIS — G8929 Other chronic pain: Secondary | ICD-10-CM | POA: Insufficient documentation

## 2017-06-01 DIAGNOSIS — I130 Hypertensive heart and chronic kidney disease with heart failure and stage 1 through stage 4 chronic kidney disease, or unspecified chronic kidney disease: Secondary | ICD-10-CM | POA: Diagnosis not present

## 2017-06-01 LAB — BASIC METABOLIC PANEL
Anion gap: 7 (ref 5–15)
BUN: 40 mg/dL — ABNORMAL HIGH (ref 6–20)
CHLORIDE: 102 mmol/L (ref 101–111)
CO2: 29 mmol/L (ref 22–32)
CREATININE: 1.93 mg/dL — AB (ref 0.44–1.00)
Calcium: 9.1 mg/dL (ref 8.9–10.3)
GFR calc non Af Amer: 27 mL/min — ABNORMAL LOW (ref >60)
GFR, EST AFRICAN AMERICAN: 31 mL/min — AB (ref >60)
GLUCOSE: 202 mg/dL — AB (ref 65–99)
Potassium: 4.4 mmol/L (ref 3.5–5.1)
SODIUM: 138 mmol/L (ref 135–145)

## 2017-06-01 MED ORDER — TORSEMIDE 20 MG PO TABS: 100.0000 mg | ORAL_TABLET | Freq: Every day | ORAL | 6 refills | Status: DC

## 2017-06-01 NOTE — Progress Notes (Signed)
Advanced Heart Failure Clinic Note   Referring Physician: Dr. Johnsie Cancel Primary Care: [place], [place]  Primary Cardiologist: Dr. Johnsie Cancel HF: Dr. Aundra Dubin  HPI: [name] is a 61 y.o. female with PMH Chronic diastolic CHF, OSA, Morbid obesity, DM2, HTN, CKD, and sinus bradycardia.   Admitted 11/2016 with CHF. Echo and RHC as below. Normal LVEF with grade 2 DD. RHC with normal pressures s/p diuresis. Discharge weight 246 lbs.   Seen by Dr. Johnsie Cancel 01/18/2017. Switched from lasix 40 mg daily to torsemide 20 mg BID with bloating and weight gain. Pt weight at that visit 252 lbs ( 6 lbs over 2 months)  Today she returns for HF follow up. Last visit torsemide was increased and she was instructed to take metolazone. Says she is only taking torsemide twice a week a few times a week. She did not take torsemide this morning. SOB if she walks out of her house. Able to walk in her house with cane. Started using CPAP. She has not been weighing daily.  Tires to follow low salt diet and limit fluid intake. Ongoing leg edema. Requires assistance with transportation.    Social: On disability. Had back surgery 08/2014 and has ongoing chronic back pain. Lives at home with roommate. Daughter lives in [place].   Echo 12/02/16 LVEF 60-65%, Grade 2 DD, Trivial TR, Trivial PI. Normal RV.   [place] 12/06/16: Hemodynamics (mmHg) RA mean 6 RV 48/7 PA 45/14 (mean 23) PCWP 8 PVR 2.7 WU AO 99% Cardiac Output (Fick) 5.6 Cardiac Index (Fick) 2.72  Past Medical History:  Diagnosis Date  . Achilles rupture   . Anxiety   . Arthritis   . Asthma   . Chronic back pain   . Chronic headaches   . Chronic leg pain    bilaterally  . Depression   . Diabetes mellitus 1999   T2DM  . Glaucoma   . H/O gestational diabetes mellitus, not currently pregnant   . Hyperlipidemia   . Hypertension   . Morbid obesity ([place])   . OSA (obstructive sleep apnea)   . Sciatica     Current Outpatient Prescriptions  Medication Sig Dispense  Refill  . acetaminophen (TYLENOL) 500 MG tablet Take 1,000 mg by mouth every 6 (six) hours as needed for mild pain.     Marland Kitchen acetaminophen-codeine (TYLENOL #3) 300-30 MG tablet Take 1 tablet by mouth every 6 (six) hours as needed for moderate pain.    Marland Kitchen albuterol (PROVENTIL HFA;VENTOLIN HFA) 108 (90 BASE) MCG/ACT inhaler Inhale 2 puffs into the lungs every 6 (six) hours as needed for wheezing or shortness of breath. For shortness of breath    . amLODipine (NORVASC) 5 MG tablet TAKE 1 TABLET BY MOUTH EVERY DAY 90 tablet 3  . dorzolamide-timolol (COSOPT) 22.3-6.8 MG/ML ophthalmic solution Place 1 drop into both eyes 2 (two) times daily.    Marland Kitchen doxazosin (CARDURA) 4 MG tablet Take 4 mg by mouth at bedtime.    . insulin aspart (NOVOLOG) 100 UNIT/ML injection Inject into the skin 3 (three) times daily before meals. Per sliding scale    . insulin glargine (LANTUS) 100 UNIT/ML injection Inject 50 Units into the skin at bedtime.    . metolazone (ZAROXOLYN) 2.5 MG tablet Take 1 tablet (2.5 mg total) by mouth as needed. Take only as directed. 5 tablet 1  . misoprostol (CYTOTEC) 200 MCG tablet Take 3 pills by mouth the night before biopsy. 3 tablet 0  . potassium chloride SA (K-DUR,KLOR-CON) 20 MEQ tablet Take  1 tablet (20 mEq total) by mouth daily. 30 tablet 6  . torsemide (DEMADEX) 20 MG tablet Take 3 tablets (60 mg total) by mouth 2 (two) times daily. 180 tablet 3  . Travoprost, BAK Free, (TRAVATAN) 0.004 % SOLN ophthalmic solution Place 1 drop into both eyes at bedtime.     No current facility-administered medications for this encounter.     Allergies  Allergen Reactions  . Pork-Derived Products Nausea And Vomiting     Social History   Social History  . Marital status: Single    Spouse name: N/A  . Number of children: N/A  . Years of education: N/A   Occupational History  . Not on file.   Social History Main Topics  . Smoking status: Former Smoker    Types: Cigarettes    Quit date:  09/04/1998  . Smokeless tobacco: Never Used  . Alcohol use Yes     Comment: occasional  . Drug use: No  . Sexual activity: Yes    Birth control/ protection: None   Other Topics Concern  . Not on file   Social History Narrative  . No narrative on file      Family History  Problem Relation Age of Onset  . Heart failure Mother   . Renal Disease Mother     Vitals:   06/01/17 1040  BP: (!) 145/58  Pulse: (!) 55  SpO2: 100%  Weight: 266 lb 12.8 oz (121 kg)   Wt Readings from Last 3 Encounters:  06/01/17 266 lb 12.8 oz (121 kg)  05/29/17 263 lb (119.3 kg)  05/24/17 271 lb 12.8 oz (123.3 kg)    PHYSICAL EXAM: General:  Well appearing. No resp difficulty. Arrived in a wheel chair.  HEENT: normal Neck: supple. JVP ~10. Carotids 2+ bilat; no bruits. No lymphadenopathy or thryomegaly appreciated. Cor: PMI nondisplaced. Regular rate & rhythm. No rubs, gallops or murmurs. Lungs: clear Abdomen: soft, nontender, nondistended. No hepatosplenomegaly. No bruits or masses. Good bowel sounds. Extremities: no cyanosis, clubbing, rash, R and LLE 2+ edema Neuro: alert & orientedx3, cranial nerves grossly intact. moves all 4 extremities w/o difficulty. Affect pleasant   ASSESSMENT & PLAN:  1. Chronic diastolic CHF: Echo 03/6159 EF 60-65%, Grade 2 DD, trivial TR, normal RV.  - NYHA IIIb symptoms.  - I am going to change torsemide to daily to try and improve compliance. Change torsemide to 100 mg daily.  -Continue current dose of potassium.  - Continue paramedicine. - Check BMET today.    2. CKD Stage III- creatinine baseline 1.6-1.8 BMET today.    3. Morbid obesity - Body mass index is 50.41 kg/m. - Encouraged to increase activity and watch portion sizes.   4. HTN A little high but has not had meds this morning.   5. Sinus bradycardia - Stable. No BB.   6. OSA - Had sleep study in May 2018 - Started CPAP last night. I encouraged compliance.    7. DM2 - Insulin dependent.   - Follows with PCP.    Follow up in 2 weeks to reassess volume. Continue Paramedicine.  Greater than 50% of the (total minutes 25) visit spent in counseling/coordination of care regarding heart failure, low salt diet, and limiting fluid intake to < 2 liters per day. I have also discussed the importance of medicaiton compliance.     Darrick Grinder, NP 06/01/17

## 2017-06-01 NOTE — Patient Instructions (Signed)
Routine lab work today. Will notify you of abnormal results, otherwise no news is good news!  CHANGE torsemide to 100 mg (5 tabs) once daily.  Follow up 2 weeks with Amy Clegg NP-C.  _______________________________________________________________  _______________________________________________________________  Take all medication as prescribed the day of your appointment. Bring all medications with you to your appointment.  Do the following things EVERYDAY: 1) Weigh yourself in the morning before breakfast. Write it down and keep it in a log. 2) Take your medicines as prescribed 3) Eat low salt foods-Limit salt (sodium) to 2000 mg per day.  4) Stay as active as you can everyday 5) Limit all fluids for the day to less than 2 liters

## 2017-06-04 ENCOUNTER — Telehealth (HOSPITAL_COMMUNITY): Payer: Self-pay

## 2017-06-04 DIAGNOSIS — I5022 Chronic systolic (congestive) heart failure: Secondary | ICD-10-CM

## 2017-06-04 NOTE — Telephone Encounter (Signed)
Basic metabolic panel  Order: [id number]  Status:  Final result Visible to patient:  No (Not Released) Dx:  Chronic diastolic heart failure ([place])  Notes recorded by Effie Berkshire, RN on 06/04/2017 at 2:02 PM EDT Scheduled for 9/20 ------  Notes recorded by Darrick Grinder D, NP on 06/01/2017 at 12:10 PM EDT Creatinine up a little. May need to cut back diuretics next visit. Repeat BMET next week. Please call.

## 2017-06-05 ENCOUNTER — Other Ambulatory Visit (HOSPITAL_COMMUNITY): Payer: Self-pay

## 2017-06-05 NOTE — Progress Notes (Signed)
Paramedicine Encounter    Patient ID: [name], female    DOB: 20-May-1956, 61 y.o.   MRN: [id number]    Patient Care Team: Javier Docker, MD as PCP - General (Internal Medicine)  Patient Active Problem List   Diagnosis Date Noted  . Hypertensive heart and renal disease   . SOB (shortness of breath)   . Chronic diastolic heart failure ([place]) 11/30/2016  . CKD (chronic kidney disease) stage 3, GFR 30-59 ml/min 11/30/2016  . Thrombocytopenia ([place]) 11/30/2016  . Sinus bradycardia 11/30/2016  . PMB (postmenopausal bleeding) 05/09/2010  . ANEMIA 05/05/2010  . DEPRESSION 04/26/2010  . TMJ SYNDROME 04/26/2010  . DEGENERATIVE DISC DISEASE, CERVICAL SPINE 04/26/2010  . CHEST PAIN 04/26/2010  . CHRONIC OBSTRUCTIVE PULMONARY DISEASE, MODERATE 03/26/2010  . MASTALGIA 10/08/2009  . Iron deficiency anemia 07/12/2009  . Diabetes mellitus ([place]) 07/02/2009  . Hyperlipidemia 07/02/2009  . BMI 45.0-49.9, adult ([place]) 07/02/2009  . OSA (obstructive sleep apnea) 07/02/2009  . Glaucoma 07/02/2009  . Hypertension 07/02/2009  . ASTHMA 07/02/2009  . LOW BACK PAIN SYNDROME 07/02/2009  . BACK PAIN 07/02/2009  . PERIPHERAL EDEMA 07/02/2009  . GROIN PAIN 07/02/2009  . TOBACCO USE, QUIT 07/02/2009  . HEMORRHOIDS, INTERNAL 07/16/2008  . DIVERTICULOSIS OF COLON 07/16/2008    Current Outpatient Prescriptions:  .  acetaminophen (TYLENOL) 500 MG tablet, Take 1,000 mg by mouth every 6 (six) hours as needed for mild pain. , Disp: , Rfl:  .  acetaminophen-codeine (TYLENOL #3) 300-30 MG tablet, Take 1 tablet by mouth every 6 (six) hours as needed for moderate pain., Disp: , Rfl:  .  albuterol (PROVENTIL HFA;VENTOLIN HFA) 108 (90 BASE) MCG/ACT inhaler, Inhale 2 puffs into the lungs every 6 (six) hours as needed for wheezing or shortness of breath. For shortness of breath, Disp: , Rfl:  .  amLODipine (NORVASC) 5 MG tablet, TAKE 1 TABLET BY MOUTH EVERY DAY, Disp: 90 tablet, Rfl: 3 .  dorzolamide-timolol  (COSOPT) 22.3-6.8 MG/ML ophthalmic solution, Place 1 drop into both eyes 2 (two) times daily., Disp: , Rfl:  .  doxazosin (CARDURA) 4 MG tablet, Take 4 mg by mouth at bedtime., Disp: , Rfl:  .  insulin aspart (NOVOLOG) 100 UNIT/ML injection, Inject into the skin 3 (three) times daily before meals. Per sliding scale, Disp: , Rfl:  .  insulin glargine (LANTUS) 100 UNIT/ML injection, Inject 50 Units into the skin at bedtime., Disp: , Rfl:  .  metolazone (ZAROXOLYN) 2.5 MG tablet, Take 1 tablet (2.5 mg total) by mouth as needed. Take only as directed., Disp: 5 tablet, Rfl: 1 .  misoprostol (CYTOTEC) 200 MCG tablet, Take 3 pills by mouth the night before biopsy., Disp: 3 tablet, Rfl: 0 .  potassium chloride SA (K-DUR,KLOR-CON) 20 MEQ tablet, Take 1 tablet (20 mEq total) by mouth daily., Disp: 30 tablet, Rfl: 6 .  torsemide (DEMADEX) 20 MG tablet, Take 5 tablets (100 mg total) by mouth daily., Disp: 150 tablet, Rfl: 6 .  Travoprost, BAK Free, (TRAVATAN) 0.004 % SOLN ophthalmic solution, Place 1 drop into both eyes at bedtime., Disp: , Rfl:  Allergies  Allergen Reactions  . Pork-Derived Products Nausea And Vomiting     Social History   Social History  . Marital status: Single    Spouse name: N/A  . Number of children: N/A  . Years of education: N/A   Occupational History  . Not on file.   Social History Main Topics  . Smoking status: Former Smoker  Types: Cigarettes    Quit date: 09/04/1998  . Smokeless tobacco: Never Used  . Alcohol use Yes     Comment: occasional  . Drug use: No  . Sexual activity: Yes    Birth control/ protection: None   Other Topics Concern  . Not on file   Social History Narrative  . No narrative on file    Physical Exam      Future Appointments Date Time Provider [place]  06/07/2017 2:00 PM [facility] None  06/11/2017 1:30 PM Gean Birchwood, DPM [facility]  06/14/2017 2:00 PM [facility] PA/NP [facility] None    Pt is  not home for her [facility] appointment.  I called her and she is on [address] "stranded" waiting for the scat transportation that she had missed due to not falling behind.  Her appointment is rescheduled for tomorrow at (am. LMP 02/16/2017 (Within Weeks)   Rheana Casebolt, EMT Paramedic 06/05/2017    ACTION: Next visit planned for tomorrow

## 2017-06-06 ENCOUNTER — Other Ambulatory Visit (HOSPITAL_COMMUNITY): Payer: Self-pay

## 2017-06-06 NOTE — Progress Notes (Signed)
Paramedicine Encounter    Patient ID: [name], female    DOB: 09/12/1956, 61 y.o.   MRN: [id number]    Patient Care Team: Javier Docker, MD as PCP - General (Internal Medicine)  Patient Active Problem List   Diagnosis Date Noted  . Hypertensive heart and renal disease   . SOB (shortness of breath)   . Chronic diastolic heart failure ([place]) 11/30/2016  . CKD (chronic kidney disease) stage 3, GFR 30-59 ml/min 11/30/2016  . Thrombocytopenia ([place]) 11/30/2016  . Sinus bradycardia 11/30/2016  . PMB (postmenopausal bleeding) 05/09/2010  . ANEMIA 05/05/2010  . DEPRESSION 04/26/2010  . TMJ SYNDROME 04/26/2010  . DEGENERATIVE DISC DISEASE, CERVICAL SPINE 04/26/2010  . CHEST PAIN 04/26/2010  . CHRONIC OBSTRUCTIVE PULMONARY DISEASE, MODERATE 03/26/2010  . MASTALGIA 10/08/2009  . Iron deficiency anemia 07/12/2009  . Diabetes mellitus ([place]) 07/02/2009  . Hyperlipidemia 07/02/2009  . BMI 45.0-49.9, adult ([place]) 07/02/2009  . OSA (obstructive sleep apnea) 07/02/2009  . Glaucoma 07/02/2009  . Hypertension 07/02/2009  . ASTHMA 07/02/2009  . LOW BACK PAIN SYNDROME 07/02/2009  . BACK PAIN 07/02/2009  . PERIPHERAL EDEMA 07/02/2009  . GROIN PAIN 07/02/2009  . TOBACCO USE, QUIT 07/02/2009  . HEMORRHOIDS, INTERNAL 07/16/2008  . DIVERTICULOSIS OF COLON 07/16/2008    Current Outpatient Prescriptions:  .  acetaminophen (TYLENOL) 500 MG tablet, Take 1,000 mg by mouth every 6 (six) hours as needed for mild pain. , Disp: , Rfl:  .  acetaminophen-codeine (TYLENOL #3) 300-30 MG tablet, Take 1 tablet by mouth every 6 (six) hours as needed for moderate pain., Disp: , Rfl:  .  albuterol (PROVENTIL HFA;VENTOLIN HFA) 108 (90 BASE) MCG/ACT inhaler, Inhale 2 puffs into the lungs every 6 (six) hours as needed for wheezing or shortness of breath. For shortness of breath, Disp: , Rfl:  .  amLODipine (NORVASC) 5 MG tablet, TAKE 1 TABLET BY MOUTH EVERY DAY, Disp: 90 tablet, Rfl: 3 .  dorzolamide-timolol  (COSOPT) 22.3-6.8 MG/ML ophthalmic solution, Place 1 drop into both eyes 2 (two) times daily., Disp: , Rfl:  .  doxazosin (CARDURA) 4 MG tablet, Take 4 mg by mouth at bedtime., Disp: , Rfl:  .  insulin aspart (NOVOLOG) 100 UNIT/ML injection, Inject into the skin 3 (three) times daily before meals. Per sliding scale, Disp: , Rfl:  .  insulin glargine (LANTUS) 100 UNIT/ML injection, Inject 50 Units into the skin at bedtime., Disp: , Rfl:  .  metolazone (ZAROXOLYN) 2.5 MG tablet, Take 1 tablet (2.5 mg total) by mouth as needed. Take only as directed., Disp: 5 tablet, Rfl: 1 .  misoprostol (CYTOTEC) 200 MCG tablet, Take 3 pills by mouth the night before biopsy., Disp: 3 tablet, Rfl: 0 .  potassium chloride SA (K-DUR,KLOR-CON) 20 MEQ tablet, Take 1 tablet (20 mEq total) by mouth daily., Disp: 30 tablet, Rfl: 6 .  torsemide (DEMADEX) 20 MG tablet, Take 5 tablets (100 mg total) by mouth daily., Disp: 150 tablet, Rfl: 6 .  Travoprost, BAK Free, (TRAVATAN) 0.004 % SOLN ophthalmic solution, Place 1 drop into both eyes at bedtime., Disp: , Rfl:  Allergies  Allergen Reactions  . Pork-Derived Products Nausea And Vomiting     Social History   Social History  . Marital status: Single    Spouse name: N/A  . Number of children: N/A  . Years of education: N/A   Occupational History  . Not on file.   Social History Main Topics  . Smoking status: Former Smoker  Types: Cigarettes    Quit date: 09/04/1998  . Smokeless tobacco: Never Used  . Alcohol use Yes     Comment: occasional  . Drug use: No  . Sexual activity: Yes    Birth control/ protection: None   Other Topics Concern  . Not on file   Social History Narrative  . No narrative on file    Physical Exam  Pulmonary/Chest: No respiratory distress. She has no wheezes. She has no rales.  Abdominal: She exhibits no distension. There is no tenderness. There is no guarding.  Musculoskeletal: She exhibits edema.  Both feet and lower legs   Skin: Skin is warm and dry.        Future Appointments Date Time Provider [place]  06/07/2017 2:00 PM [facility] None  06/11/2017 1:30 PM Gean Birchwood, DPM [facility]  06/14/2017 2:00 PM [facility] PA/NP [facility] None    ATF pt CAO x4 sitting in the kitchen eating breakfast, pt has an appointment today so she's about to get ready for the scat bus.  Pt stated that she was called yesterday and was advised to discontinue the torsemide afternoon dose.  She stated that her kidney function is not as good as they previously thought.  Pt is still not taking the gabapentin because she doesn't like the way it makes her feel.  She denies pain today.  She stated that she think that she lost some fluid weight but her feet and legs still has edema.  Pt denies sob, dizziness, headache and chest pain.  She has not taken her morning meds yet which includes insulin but she plans to after I leave.  rx bottles verified and pill box refilled.    BP 118/60   Pulse 63   Resp 16   Wt 263 lb (119.3 kg)   LMP 02/16/2017 (Within Weeks)   SpO2 98%   BMI 49.69 kg/m   CBG 171  Weight yesterday-cant remember Last visit weight-263    Jaala Bohle, EMT Paramedic 06/06/2017    ACTION: Home visit completed Next visit planned for next tues

## 2017-06-07 ENCOUNTER — Ambulatory Visit (HOSPITAL_COMMUNITY)
Admission: RE | Admit: 2017-06-07 | Discharge: 2017-06-07 | Disposition: A | Discharge status: Home / Self Care | Payer: Medicaid Other | Source: Ambulatory Visit | Attending: Internal Medicine | Admitting: Internal Medicine

## 2017-06-07 DIAGNOSIS — I5022 Chronic systolic (congestive) heart failure: Secondary | ICD-10-CM | POA: Insufficient documentation

## 2017-06-07 LAB — BASIC METABOLIC PANEL
Anion gap: 8 (ref 5–15)
BUN: 27 mg/dL — ABNORMAL HIGH (ref 6–20)
CALCIUM: 9.1 mg/dL (ref 8.9–10.3)
CHLORIDE: 101 mmol/L (ref 101–111)
CO2: 27 mmol/L (ref 22–32)
CREATININE: 1.8 mg/dL — AB (ref 0.44–1.00)
GFR calc Af Amer: 34 mL/min — ABNORMAL LOW (ref >60)
GFR calc non Af Amer: 29 mL/min — ABNORMAL LOW (ref >60)
GLUCOSE: 265 mg/dL — AB (ref 65–99)
Potassium: 4.2 mmol/L (ref 3.5–5.1)
Sodium: 136 mmol/L (ref 135–145)

## 2017-06-11 ENCOUNTER — Encounter: Payer: Self-pay | Admitting: Podiatry

## 2017-06-11 ENCOUNTER — Ambulatory Visit (INDEPENDENT_AMBULATORY_CARE_PROVIDER_SITE_OTHER): Payer: Medicaid Other | Admitting: Podiatry

## 2017-06-11 DIAGNOSIS — E1151 Type 2 diabetes mellitus with diabetic peripheral angiopathy without gangrene: Secondary | ICD-10-CM

## 2017-06-11 DIAGNOSIS — B351 Tinea unguium: Secondary | ICD-10-CM

## 2017-06-11 NOTE — Patient Instructions (Signed)

## 2017-06-11 NOTE — Progress Notes (Signed)
Patient ID: [name], female   DOB: [birth year], 61 y.o.   MRN: [id number]   Subjective: This diabetic patient presents today complaining of uncomfortable elongated and thickened toenails and requests toenail debridement  Objective: Patient appears orientated 3 in no apparent pain  Vascular: DP pulses 2/4 bilaterally PT pulses nonpalpable bilaterally Capillary reflex immediate bilaterally  Neurological: Sensation to 10 g monofilament wire intact 5/5 bilaterally Vibratory sensation nonreactive bilaterally Ankle reflexes reactive bilaterally  Dermatological: No open skin lesions bilaterally Absent hair growth bilaterally The toenails are elongated, discolored, deformed, hypertrophic and tender to direct palpation 6-10  Musculoskeletal: HAV left  Assessment: Diabetic with peripheral vascular disease Diabetic with a history of peripheral neuropathy Mycotic toenails 6-10  Plan: Debridement toenails 6-10 mechanically and electrical without any bleeding  Reappoint 3 months

## 2017-06-12 ENCOUNTER — Other Ambulatory Visit (HOSPITAL_COMMUNITY): Payer: Self-pay

## 2017-06-12 NOTE — Progress Notes (Signed)
Paramedicine Encounter    Patient ID: [name], female    DOB: 15-Oct-1955, 61 y.o.   MRN: [id number]    Patient Care Team: Javier Docker, MD as PCP - General (Internal Medicine)  Patient Active Problem List   Diagnosis Date Noted  . Hypertensive heart and renal disease   . SOB (shortness of breath)   . Chronic diastolic heart failure ([place]) 11/30/2016  . CKD (chronic kidney disease) stage 3, GFR 30-59 ml/min 11/30/2016  . Thrombocytopenia ([place]) 11/30/2016  . Sinus bradycardia 11/30/2016  . PMB (postmenopausal bleeding) 05/09/2010  . ANEMIA 05/05/2010  . DEPRESSION 04/26/2010  . TMJ SYNDROME 04/26/2010  . DEGENERATIVE DISC DISEASE, CERVICAL SPINE 04/26/2010  . CHEST PAIN 04/26/2010  . CHRONIC OBSTRUCTIVE PULMONARY DISEASE, MODERATE 03/26/2010  . MASTALGIA 10/08/2009  . Iron deficiency anemia 07/12/2009  . Diabetes mellitus ([place]) 07/02/2009  . Hyperlipidemia 07/02/2009  . BMI 45.0-49.9, adult ([place]) 07/02/2009  . OSA (obstructive sleep apnea) 07/02/2009  . Glaucoma 07/02/2009  . Hypertension 07/02/2009  . ASTHMA 07/02/2009  . LOW BACK PAIN SYNDROME 07/02/2009  . BACK PAIN 07/02/2009  . PERIPHERAL EDEMA 07/02/2009  . GROIN PAIN 07/02/2009  . TOBACCO USE, QUIT 07/02/2009  . HEMORRHOIDS, INTERNAL 07/16/2008  . DIVERTICULOSIS OF COLON 07/16/2008    Current Outpatient Prescriptions:  .  acetaminophen (TYLENOL) 500 MG tablet, Take 1,000 mg by mouth every 6 (six) hours as needed for mild pain. , Disp: , Rfl:  .  acetaminophen-codeine (TYLENOL #3) 300-30 MG tablet, Take 1 tablet by mouth every 6 (six) hours as needed for moderate pain., Disp: , Rfl:  .  albuterol (PROVENTIL HFA;VENTOLIN HFA) 108 (90 BASE) MCG/ACT inhaler, Inhale 2 puffs into the lungs every 6 (six) hours as needed for wheezing or shortness of breath. For shortness of breath, Disp: , Rfl:  .  amLODipine (NORVASC) 5 MG tablet, TAKE 1 TABLET BY MOUTH EVERY DAY, Disp: 90 tablet, Rfl: 3 .  dorzolamide-timolol  (COSOPT) 22.3-6.8 MG/ML ophthalmic solution, Place 1 drop into both eyes 2 (two) times daily., Disp: , Rfl:  .  doxazosin (CARDURA) 4 MG tablet, Take 4 mg by mouth at bedtime., Disp: , Rfl:  .  insulin aspart (NOVOLOG) 100 UNIT/ML injection, Inject into the skin 3 (three) times daily before meals. Per sliding scale, Disp: , Rfl:  .  insulin glargine (LANTUS) 100 UNIT/ML injection, Inject 50 Units into the skin at bedtime., Disp: , Rfl:  .  metolazone (ZAROXOLYN) 2.5 MG tablet, Take 1 tablet (2.5 mg total) by mouth as needed. Take only as directed., Disp: 5 tablet, Rfl: 1 .  misoprostol (CYTOTEC) 200 MCG tablet, Take 3 pills by mouth the night before biopsy., Disp: 3 tablet, Rfl: 0 .  potassium chloride SA (K-DUR,KLOR-CON) 20 MEQ tablet, Take 1 tablet (20 mEq total) by mouth daily., Disp: 30 tablet, Rfl: 6 .  torsemide (DEMADEX) 20 MG tablet, Take 5 tablets (100 mg total) by mouth daily., Disp: 150 tablet, Rfl: 6 .  Travoprost, BAK Free, (TRAVATAN) 0.004 % SOLN ophthalmic solution, Place 1 drop into both eyes at bedtime., Disp: , Rfl:  Allergies  Allergen Reactions  . Pork-Derived Products Nausea And Vomiting     Social History   Social History  . Marital status: Single    Spouse name: N/A  . Number of children: N/A  . Years of education: N/A   Occupational History  . Not on file.   Social History Main Topics  . Smoking status: Former Smoker  Types: Cigarettes    Quit date: 09/04/1998  . Smokeless tobacco: Never Used  . Alcohol use Yes     Comment: occasional  . Drug use: No  . Sexual activity: Yes    Birth control/ protection: None   Other Topics Concern  . Not on file   Social History Narrative  . No narrative on file    Physical Exam  Pulmonary/Chest: No respiratory distress. She has no wheezes. She has no rales.  Abdominal: She exhibits no distension. There is no tenderness. There is no guarding.  Musculoskeletal: She exhibits edema.  Skin: Skin is warm and dry.  She is not diaphoretic.        Future Appointments Date Time Provider [place]  06/15/2017 10:30 AM [facility] PA/NP [facility] None  09/03/2017 3:45 PM Tuchman, Leslye Peer, DPM [facility]    ATF pt CAO x4 with no complaints today. She just got home from running errands.  She has taken all of her meds this week but her feet still appears to be swollen.  Pt took 12 units of insulin prior to my arrival but her blood sugar is still elevated.  Pt has been taking her meds without difficulty; if she has an appointment in the am she takes her lasix when she gets home.  Pt still have concern about her kidney function.  She has an appointment this week with [facility], she plans to talk to the physician about the same at that time.  rx bottles verified and pill box refilled.  BP 136/64   Pulse (!) 58   Resp 16   LMP 02/16/2017 (Within Weeks)   SpO2 99%    Weight yesterday-didn't weigh Last visit weight-263    Jacob Chamblee, EMT Paramedic 06/12/2017    ACTION: Home visit completed

## 2017-06-14 ENCOUNTER — Telehealth (HOSPITAL_COMMUNITY): Payer: Self-pay

## 2017-06-14 ENCOUNTER — Encounter (HOSPITAL_COMMUNITY): Payer: Medicaid Other

## 2017-06-14 NOTE — Telephone Encounter (Signed)
[facility] appointment reminder call placed to patient for upcoming appointment.  LVMTCB to confirm apt.  Patient also reminded to take all medications as prescribed on the day of his/her appointment and to bring all medications to this appointment.  Advised to call our office for tardiness or cancellations/rescheduling needs.  Leory Plowman, Guinevere Ferrari

## 2017-06-15 ENCOUNTER — Encounter (HOSPITAL_COMMUNITY): Payer: Self-pay

## 2017-06-15 ENCOUNTER — Telehealth (HOSPITAL_COMMUNITY): Payer: Self-pay

## 2017-06-15 ENCOUNTER — Ambulatory Visit (HOSPITAL_COMMUNITY)
Admission: RE | Admit: 2017-06-15 | Discharge: 2017-06-15 | Disposition: A | Discharge status: Home / Self Care | Payer: Medicaid Other | Source: Ambulatory Visit | Attending: Cardiology | Admitting: Cardiology

## 2017-06-15 VITALS — BP 130/60 | HR 60 | Wt 271.2 lb

## 2017-06-15 DIAGNOSIS — I5032 Chronic diastolic (congestive) heart failure: Secondary | ICD-10-CM | POA: Diagnosis not present

## 2017-06-15 DIAGNOSIS — Z87891 Personal history of nicotine dependence: Secondary | ICD-10-CM | POA: Insufficient documentation

## 2017-06-15 DIAGNOSIS — E1122 Type 2 diabetes mellitus with diabetic chronic kidney disease: Secondary | ICD-10-CM | POA: Insufficient documentation

## 2017-06-15 DIAGNOSIS — R001 Bradycardia, unspecified: Secondary | ICD-10-CM | POA: Diagnosis not present

## 2017-06-15 DIAGNOSIS — Z6841 Body Mass Index (BMI) 40.0 and over, adult: Secondary | ICD-10-CM | POA: Insufficient documentation

## 2017-06-15 DIAGNOSIS — I10 Essential (primary) hypertension: Secondary | ICD-10-CM

## 2017-06-15 DIAGNOSIS — I130 Hypertensive heart and chronic kidney disease with heart failure and stage 1 through stage 4 chronic kidney disease, or unspecified chronic kidney disease: Secondary | ICD-10-CM | POA: Insufficient documentation

## 2017-06-15 DIAGNOSIS — N183 Chronic kidney disease, stage 3 unspecified: Secondary | ICD-10-CM

## 2017-06-15 DIAGNOSIS — E785 Hyperlipidemia, unspecified: Secondary | ICD-10-CM | POA: Insufficient documentation

## 2017-06-15 DIAGNOSIS — E1140 Type 2 diabetes mellitus with diabetic neuropathy, unspecified: Secondary | ICD-10-CM

## 2017-06-15 DIAGNOSIS — G4733 Obstructive sleep apnea (adult) (pediatric): Secondary | ICD-10-CM

## 2017-06-15 DIAGNOSIS — M549 Dorsalgia, unspecified: Secondary | ICD-10-CM | POA: Diagnosis not present

## 2017-06-15 DIAGNOSIS — G8929 Other chronic pain: Secondary | ICD-10-CM | POA: Diagnosis not present

## 2017-06-15 DIAGNOSIS — Z79899 Other long term (current) drug therapy: Secondary | ICD-10-CM | POA: Insufficient documentation

## 2017-06-15 DIAGNOSIS — Z794 Long term (current) use of insulin: Secondary | ICD-10-CM

## 2017-06-15 LAB — BASIC METABOLIC PANEL
Anion gap: 7 (ref 5–15)
BUN: 36 mg/dL — AB (ref 6–20)
CALCIUM: 9.2 mg/dL (ref 8.9–10.3)
CHLORIDE: 102 mmol/L (ref 101–111)
CO2: 30 mmol/L (ref 22–32)
CREATININE: 1.64 mg/dL — AB (ref 0.44–1.00)
GFR calc non Af Amer: 33 mL/min — ABNORMAL LOW (ref >60)
GFR, EST AFRICAN AMERICAN: 38 mL/min — AB (ref >60)
GLUCOSE: 146 mg/dL — AB (ref 65–99)
Potassium: 4.4 mmol/L (ref 3.5–5.1)
Sodium: 139 mmol/L (ref 135–145)

## 2017-06-15 MED ORDER — TORSEMIDE 20 MG PO TABS: 60.0000 mg | ORAL_TABLET | Freq: Two times a day (BID) | ORAL | 6 refills | Status: DC

## 2017-06-15 MED ORDER — SPIRONOLACTONE 25 MG PO TABS
12.5000 mg | ORAL_TABLET | Freq: Every day | ORAL | 3 refills | Status: DC
Start: 2017-06-15 — End: 2017-06-27

## 2017-06-15 MED ORDER — METOLAZONE 2.5 MG PO TABS: 2.5000 mg | ORAL_TABLET | ORAL | 1 refills | Status: DC | PRN

## 2017-06-15 NOTE — Progress Notes (Signed)
Advanced Heart Failure Clinic Note   Referring Physician: Dr. Johnsie Cancel Primary Care: [place], [place]  Primary Cardiologist: Dr. Johnsie Cancel HF: Dr. Aundra Dubin  HPI: [name] is a 61 y.o. female with PMH Chronic diastolic CHF, OSA, Morbid obesity, DM2, HTN, CKD, and sinus bradycardia.   Admitted 11/2016 with CHF. Echo and RHC as below. Normal LVEF with grade 2 DD. RHC with normal pressures s/p diuresis. Discharge weight 246 lbs.   Seen by Dr. Johnsie Cancel 01/18/2017. Switched from lasix 40 mg daily to torsemide 20 mg BID with bloating and weight gain. Pt weight at that visit 252 lbs ( 6 lbs over 2 months)  Social: On disability. Had back surgery 08/2014 and has ongoing chronic back pain. Lives at home with roommate. Daughter lives in [place].   [name] presents today for HF follow up. She was seen 2 weeks ago, felt to be volume overloaded and torsemide was changed to daily dosing to try to increase compliance. She is also followed by paramedicine. Says she has been taking her medications, but says that some days she doesn't take torsemide like she is supposed to, says "5 pills at one time is too much for me to take". Drinking more than 2L a day, says she is eating low salt foods, but paramedicine reports high sodium intake. Denies chest pain, orthopnea, PND and palpitations.   Echo 12/02/16 LVEF 60-65%, Grade 2 DD, Trivial TR, Trivial PI. Normal RV.   [place] 12/06/16: Hemodynamics (mmHg) RA mean 6 RV 48/7 PA 45/14 (mean 23) PCWP 8 PVR 2.7 WU AO 99% Cardiac Output (Fick) 5.6 Cardiac Index (Fick) 2.72  Past Medical History:  Diagnosis Date  . Achilles rupture   . Anxiety   . Arthritis   . Asthma   . Chronic back pain   . Chronic headaches   . Chronic leg pain    bilaterally  . Depression   . Diabetes mellitus 1999   T2DM  . Glaucoma   . H/O gestational diabetes mellitus, not currently pregnant   . Hyperlipidemia   . Hypertension   . Morbid obesity ([place])   . OSA (obstructive sleep apnea)    . Sciatica     Current Outpatient Prescriptions  Medication Sig Dispense Refill  . acetaminophen (TYLENOL) 500 MG tablet Take 1,000 mg by mouth every 6 (six) hours as needed for mild pain.     Marland Kitchen acetaminophen-codeine (TYLENOL #3) 300-30 MG tablet Take 1 tablet by mouth every 6 (six) hours as needed for moderate pain.    Marland Kitchen albuterol (PROVENTIL HFA;VENTOLIN HFA) 108 (90 BASE) MCG/ACT inhaler Inhale 2 puffs into the lungs every 6 (six) hours as needed for wheezing or shortness of breath. For shortness of breath    . amLODipine (NORVASC) 5 MG tablet TAKE 1 TABLET BY MOUTH EVERY DAY 90 tablet 3  . dorzolamide-timolol (COSOPT) 22.3-6.8 MG/ML ophthalmic solution Place 1 drop into both eyes 2 (two) times daily.    Marland Kitchen doxazosin (CARDURA) 4 MG tablet Take 4 mg by mouth at bedtime.    . insulin aspart (NOVOLOG) 100 UNIT/ML injection Inject into the skin 3 (three) times daily before meals. Per sliding scale    . insulin glargine (LANTUS) 100 UNIT/ML injection Inject 50 Units into the skin at bedtime.    . metolazone (ZAROXOLYN) 2.5 MG tablet Take 1 tablet (2.5 mg total) by mouth as needed. Take only as directed. 5 tablet 1  . potassium chloride SA (K-DUR,KLOR-CON) 20 MEQ tablet Take 1 tablet (20 mEq total) by  mouth daily. 30 tablet 6  . torsemide (DEMADEX) 20 MG tablet Take 5 tablets (100 mg total) by mouth daily. (Patient taking differently: Take 60 mg by mouth daily. ) 150 tablet 6  . Travoprost, BAK Free, (TRAVATAN) 0.004 % SOLN ophthalmic solution Place 1 drop into both eyes at bedtime.     No current facility-administered medications for this encounter.     Allergies  Allergen Reactions  . Pork-Derived Products Nausea And Vomiting     Social History   Social History  . Marital status: Single    Spouse name: N/A  . Number of children: N/A  . Years of education: N/A   Occupational History  . Not on file.   Social History Main Topics  . Smoking status: Former Smoker    Types:  Cigarettes    Quit date: 09/04/1998  . Smokeless tobacco: Never Used  . Alcohol use Yes     Comment: occasional  . Drug use: No  . Sexual activity: Yes    Birth control/ protection: None   Other Topics Concern  . Not on file   Social History Narrative  . No narrative on file      Family History  Problem Relation Age of Onset  . Heart failure Mother   . Renal Disease Mother     Vitals:   06/15/17 1047  BP: 130/60  Pulse: 60  SpO2: 100%  Weight: 271 lb 3.2 oz (123 kg)   Wt Readings from Last 3 Encounters:  06/15/17 271 lb 3.2 oz (123 kg)  06/06/17 263 lb (119.3 kg)  06/01/17 266 lb 12.8 oz (121 kg)    PHYSICAL EXAM: General: Well appearing. No resp difficulty. Arrived in wheelchair.  HEENT: Normal Neck: Supple. JVP 9-10 cm . Carotids 2+ bilat; no bruits. No thyromegaly or nodule noted. Cor: PMI nondisplaced. RRR, No M/G/R noted Lungs: CTAB, normal effort. Abdomen: Obese, soft, non-tender, non-distended, no HSM. No bruits or masses. +BS  Extremities: No cyanosis, clubbing, rash, 3+ edema to knees.  Neuro: Alert & orientedx3, cranial nerves grossly intact. moves all 4 extremities w/o difficulty. Affect pleasant    ASSESSMENT & PLAN:  1. Chronic diastolic CHF: Echo 09/6107 EF 60-65%, Grade 2 DD, trivial TR, normal RV.  - NYHA III - Remains volume overloaded on exam with 3+ edema to knees. Weight unchanged from last visit. Take 2.5 mg metolazone 2.5 mg today.  - Increase torsemide to 60mg  BID for 3 days, then torsemide 60 mg daily.  - Start Spiro 12.5 mg hs.  - BMET today and in 5 days to check renal function and K.  - Continue current dose of Kcl  - Continue paramedicine.   2. CKD Stage III: baseline creatinine 1.6-1.8 - BMET today.    3. Morbid obesity - Body mass index is 51.24 kg/m. - Talked to her about the importance of diet and exercise.   4. HTN - Well controlled on current regimen.   5. Sinus bradycardia - Stable, not on beta blocker.   6.  OSA - Started wearing CPAP. Encouraged her to continue.    7. DM2 - Follow with PCP.   Follow up next week for labs, and in 2 weeks for office visit.    Arbutus Leas, NP 06/15/17

## 2017-06-15 NOTE — Patient Instructions (Signed)
Take metolazone 2.5 mg tablet once tomorrow only.  Take Torsemide 60 mg (3 tabs) twice daily.  START Spironolactone 12.5 mg (1/2 tablet) once daily.  Routine lab work today. Will notify you of abnormal results, otherwise no news is good news!  Return next week to repeat labs.  _______________________________________________________________ [name] Code: 8000  Follow up 2-3 weeks with Erin.  _______________________________________________________________ [name] Code: 8000   Take all medication as prescribed the day of your appointment. Bring all medications with you to your appointment.  Do the following things EVERYDAY: 1) Weigh yourself in the morning before breakfast. Write it down and keep it in a log. 2) Take your medicines as prescribed 3) Eat low salt foods-Limit salt (sodium) to 2000 mg per day.  4) Stay as active as you can everyday 5) Limit all fluids for the day to less than 2 liters

## 2017-06-15 NOTE — Telephone Encounter (Signed)
Per Jettie Booze NP-C, LVM for patient to take Torsemide 60 mg twice daily for three days only, then reduce back to torsemide 60 mg once daily until labs repeated next week. Asked to call us back so we can clarify she received this message and understands instructions.  Renee Pain, RN

## 2017-06-19 ENCOUNTER — Telehealth (HOSPITAL_COMMUNITY): Payer: Self-pay

## 2017-06-19 ENCOUNTER — Other Ambulatory Visit (HOSPITAL_COMMUNITY): Payer: Self-pay

## 2017-06-19 NOTE — Progress Notes (Signed)
Paramedicine Encounter    Patient ID: [name], female    DOB: [birth year], 61 y.o.   MRN: [id number]    Patient Care Team: Javier Docker, MD as PCP - General (Internal Medicine)  Patient Active Problem List   Diagnosis Date Noted  . Hypertensive heart and renal disease   . SOB (shortness of breath)   . Chronic diastolic heart failure ([place]) 11/30/2016  . CKD (chronic kidney disease) stage 3, GFR 30-59 ml/min (HCC) 11/30/2016  . Thrombocytopenia ([place]) 11/30/2016  . Sinus bradycardia 11/30/2016  . PMB (postmenopausal bleeding) 05/09/2010  . ANEMIA 05/05/2010  . DEPRESSION 04/26/2010  . TMJ SYNDROME 04/26/2010  . DEGENERATIVE DISC DISEASE, CERVICAL SPINE 04/26/2010  . CHEST PAIN 04/26/2010  . CHRONIC OBSTRUCTIVE PULMONARY DISEASE, MODERATE 03/26/2010  . MASTALGIA 10/08/2009  . Iron deficiency anemia 07/12/2009  . Diabetes mellitus ([place]) 07/02/2009  . Hyperlipidemia 07/02/2009  . BMI 45.0-49.9, adult ([place]) 07/02/2009  . OSA (obstructive sleep apnea) 07/02/2009  . Glaucoma 07/02/2009  . Hypertension 07/02/2009  . ASTHMA 07/02/2009  . LOW BACK PAIN SYNDROME 07/02/2009  . BACK PAIN 07/02/2009  . PERIPHERAL EDEMA 07/02/2009  . GROIN PAIN 07/02/2009  . TOBACCO USE, QUIT 07/02/2009  . HEMORRHOIDS, INTERNAL 07/16/2008  . DIVERTICULOSIS OF COLON 07/16/2008    Current Outpatient Prescriptions:  .  acetaminophen (TYLENOL) 500 MG tablet, Take 1,000 mg by mouth every 6 (six) hours as needed for mild pain. , Disp: , Rfl:  .  acetaminophen-codeine (TYLENOL #3) 300-30 MG tablet, Take 1 tablet by mouth every 6 (six) hours as needed for moderate pain., Disp: , Rfl:  .  albuterol (PROVENTIL HFA;VENTOLIN HFA) 108 (90 BASE) MCG/ACT inhaler, Inhale 2 puffs into the lungs every 6 (six) hours as needed for wheezing or shortness of breath. For shortness of breath, Disp: , Rfl:  .  amLODipine (NORVASC) 5 MG tablet, TAKE 1 TABLET BY MOUTH EVERY DAY, Disp: 90 tablet, Rfl: 3 .   dorzolamide-timolol (COSOPT) 22.3-6.8 MG/ML ophthalmic solution, Place 1 drop into both eyes 2 (two) times daily., Disp: , Rfl:  .  doxazosin (CARDURA) 4 MG tablet, Take 4 mg by mouth at bedtime., Disp: , Rfl:  .  insulin aspart (NOVOLOG) 100 UNIT/ML injection, Inject into the skin 3 (three) times daily before meals. Per sliding scale, Disp: , Rfl:  .  insulin glargine (LANTUS) 100 UNIT/ML injection, Inject 50 Units into the skin at bedtime., Disp: , Rfl:  .  metolazone (ZAROXOLYN) 2.5 MG tablet, Take 1 tablet (2.5 mg total) by mouth as needed. Take only as directed., Disp: 5 tablet, Rfl: 1 .  potassium chloride SA (K-DUR,KLOR-CON) 20 MEQ tablet, Take 1 tablet (20 mEq total) by mouth daily., Disp: 30 tablet, Rfl: 6 .  spironolactone (ALDACTONE) 25 MG tablet, Take 0.5 tablets (12.5 mg total) by mouth daily., Disp: 45 tablet, Rfl: 3 .  torsemide (DEMADEX) 20 MG tablet, Take 3 tablets (60 mg total) by mouth 2 (two) times daily., Disp: 180 tablet, Rfl: 6 .  Travoprost, BAK Free, (TRAVATAN) 0.004 % SOLN ophthalmic solution, Place 1 drop into both eyes at bedtime., Disp: , Rfl:  Allergies  Allergen Reactions  . Pork-Derived Products Nausea And Vomiting     Social History   Social History  . Marital status: Single    Spouse name: N/A  . Number of children: N/A  . Years of education: N/A   Occupational History  . Not on file.   Social History Main Topics  . Smoking status: Former  Smoker    Types: Cigarettes    Quit date: 09/04/1998  . Smokeless tobacco: Never Used  . Alcohol use Yes     Comment: occasional  . Drug use: No  . Sexual activity: Yes    Birth control/ protection: None   Other Topics Concern  . Not on file   Social History Narrative  . No narrative on file    Physical Exam  Pulmonary/Chest: No respiratory distress.  Abdominal: She exhibits no distension. There is no tenderness. There is no guarding.  Musculoskeletal: She exhibits edema.  Skin: Skin is warm and dry.  She is not diaphoretic.        Future Appointments Date Time Provider [place]  06/25/2017 10:00 AM [facility] LAB [facility] None  07/06/2017 10:00 AM [facility] PA/NP [facility] None  09/03/2017 3:45 PM Tuchman, Leslye Peer, DPM [facility]    ATF pt CAO x4 sitting in the livingroom c/o lower back pain. She stated that the pain has gotten worse in the past couple of days. Pt was advised to take metalozone last week due to wight gain but she didn't take them.  Pt stated that she didn't take the metalozone due to her being afraid of the effects that the meds has on her kidneys.  Pt doesn't want to take spirolactone either due to "possible effects on her kidneys".  Pt called her PCP to verify if she is "supposed to take".  Pt still have edema in her feet and legs.  Pt stated that she is now measuring her fluid intake.  rx bottles verified/pts pill box with the exception of spirolactone, 2nd dose of torsemide, and metalozone (last week).    Dr. Alphonzo Grieve took pt off of amilopine yesterday due to kidney concerns per pt and shes now taking lisinopril.     cbg 199 **rx called in: potassium  LMP 02/16/2017 (Within Weeks)   Weight yesterday-didn't weigh Last visit weight-271 (2/28)    Chenita Ruda, EMT Paramedic 06/19/2017    ACTION: Home visit completed

## 2017-06-20 ENCOUNTER — Telehealth (HOSPITAL_COMMUNITY): Payer: Self-pay | Admitting: *Deleted

## 2017-06-20 NOTE — Telephone Encounter (Signed)
Opened in error

## 2017-06-25 ENCOUNTER — Other Ambulatory Visit (HOSPITAL_COMMUNITY): Payer: Self-pay | Admitting: *Deleted

## 2017-06-25 ENCOUNTER — Ambulatory Visit (HOSPITAL_COMMUNITY)
Admission: RE | Admit: 2017-06-25 | Discharge: 2017-06-25 | Disposition: A | Discharge status: Home / Self Care | Payer: Medicaid Other | Source: Ambulatory Visit | Attending: Cardiology | Admitting: Cardiology

## 2017-06-25 DIAGNOSIS — I5041 Acute combined systolic (congestive) and diastolic (congestive) heart failure: Secondary | ICD-10-CM

## 2017-06-25 DIAGNOSIS — I5032 Chronic diastolic (congestive) heart failure: Secondary | ICD-10-CM | POA: Insufficient documentation

## 2017-06-25 LAB — BASIC METABOLIC PANEL
ANION GAP: 8 (ref 5–15)
BUN: 41 mg/dL — AB (ref 6–20)
CHLORIDE: 103 mmol/L (ref 101–111)
CO2: 26 mmol/L (ref 22–32)
Calcium: 8.8 mg/dL — ABNORMAL LOW (ref 8.9–10.3)
Creatinine, Ser: 2.18 mg/dL — ABNORMAL HIGH (ref 0.44–1.00)
GFR calc Af Amer: 27 mL/min — ABNORMAL LOW (ref >60)
GFR, EST NON AFRICAN AMERICAN: 23 mL/min — AB (ref >60)
Glucose, Bld: 226 mg/dL — ABNORMAL HIGH (ref 65–99)
POTASSIUM: 4.3 mmol/L (ref 3.5–5.1)
Sodium: 137 mmol/L (ref 135–145)

## 2017-06-25 NOTE — Addendum Note (Signed)
Encounter addended by: Scarlette Calico, RN on: 06/25/2017 10:09 AM<BR>    Actions taken: Visit diagnoses modified, Order list changed, Diagnosis association updated

## 2017-06-26 ENCOUNTER — Telehealth: Payer: Self-pay | Admitting: Licensed Clinical Social Worker

## 2017-06-26 ENCOUNTER — Other Ambulatory Visit (HOSPITAL_COMMUNITY): Payer: Self-pay

## 2017-06-26 NOTE — Progress Notes (Signed)
Paramedicine Encounter    Patient ID: [name], female    DOB: 02-Jul-1956, 61 y.o.   MRN: [id number]    Patient Care Team: Javier Docker, MD as PCP - General (Internal Medicine)  Patient Active Problem List   Diagnosis Date Noted  . Hypertensive heart and renal disease   . SOB (shortness of breath)   . Chronic diastolic heart failure ([place]) 11/30/2016  . CKD (chronic kidney disease) stage 3, GFR 30-59 ml/min (HCC) 11/30/2016  . Thrombocytopenia ([place]) 11/30/2016  . Sinus bradycardia 11/30/2016  . PMB (postmenopausal bleeding) 05/09/2010  . ANEMIA 05/05/2010  . DEPRESSION 04/26/2010  . TMJ SYNDROME 04/26/2010  . DEGENERATIVE DISC DISEASE, CERVICAL SPINE 04/26/2010  . CHEST PAIN 04/26/2010  . CHRONIC OBSTRUCTIVE PULMONARY DISEASE, MODERATE 03/26/2010  . MASTALGIA 10/08/2009  . Iron deficiency anemia 07/12/2009  . Diabetes mellitus ([place]) 07/02/2009  . Hyperlipidemia 07/02/2009  . BMI 45.0-49.9, adult ([place]) 07/02/2009  . OSA (obstructive sleep apnea) 07/02/2009  . Glaucoma 07/02/2009  . Hypertension 07/02/2009  . ASTHMA 07/02/2009  . LOW BACK PAIN SYNDROME 07/02/2009  . BACK PAIN 07/02/2009  . PERIPHERAL EDEMA 07/02/2009  . GROIN PAIN 07/02/2009  . TOBACCO USE, QUIT 07/02/2009  . HEMORRHOIDS, INTERNAL 07/16/2008  . DIVERTICULOSIS OF COLON 07/16/2008    Current Outpatient Prescriptions:  .  doxazosin (CARDURA) 4 MG tablet, Take 4 mg by mouth at bedtime., Disp: , Rfl:  .  potassium chloride SA (K-DUR,KLOR-CON) 20 MEQ tablet, Take 1 tablet (20 mEq total) by mouth daily., Disp: 30 tablet, Rfl: 6 .  torsemide (DEMADEX) 20 MG tablet, Take 3 tablets (60 mg total) by mouth 2 (two) times daily. (Patient taking differently: Take 60 mg by mouth daily. ), Disp: 180 tablet, Rfl: 6 .  acetaminophen (TYLENOL) 500 MG tablet, Take 1,000 mg by mouth every 6 (six) hours as needed for mild pain. , Disp: , Rfl:  .  acetaminophen-codeine (TYLENOL #3) 300-30 MG tablet, Take 1 tablet by  mouth every 6 (six) hours as needed for moderate pain., Disp: , Rfl:  .  albuterol (PROVENTIL HFA;VENTOLIN HFA) 108 (90 BASE) MCG/ACT inhaler, Inhale 2 puffs into the lungs every 6 (six) hours as needed for wheezing or shortness of breath. For shortness of breath, Disp: , Rfl:  .  amLODipine (NORVASC) 5 MG tablet, TAKE 1 TABLET BY MOUTH EVERY DAY (Patient not taking: Reported on 06/19/2017), Disp: 90 tablet, Rfl: 3 .  dorzolamide-timolol (COSOPT) 22.3-6.8 MG/ML ophthalmic solution, Place 1 drop into both eyes 2 (two) times daily., Disp: , Rfl:  .  insulin aspart (NOVOLOG) 100 UNIT/ML injection, Inject into the skin 3 (three) times daily before meals. Per sliding scale, Disp: , Rfl:  .  insulin glargine (LANTUS) 100 UNIT/ML injection, Inject 50 Units into the skin at bedtime., Disp: , Rfl:  .  metolazone (ZAROXOLYN) 2.5 MG tablet, Take 1 tablet (2.5 mg total) by mouth as needed. Take only as directed. (Patient not taking: Reported on 06/19/2017), Disp: 5 tablet, Rfl: 1 .  spironolactone (ALDACTONE) 25 MG tablet, Take 0.5 tablets (12.5 mg total) by mouth daily. (Patient not taking: Reported on 06/20/2017), Disp: 45 tablet, Rfl: 3 .  Travoprost, BAK Free, (TRAVATAN) 0.004 % SOLN ophthalmic solution, Place 1 drop into both eyes at bedtime., Disp: , Rfl:  Allergies  Allergen Reactions  . Pork-Derived Products Nausea And Vomiting     Social History   Social History  . Marital status: Single    Spouse name: N/A  . Number of  children: N/A  . Years of education: N/A   Occupational History  . Not on file.   Social History Main Topics  . Smoking status: Former Smoker    Types: Cigarettes    Quit date: 09/04/1998  . Smokeless tobacco: Never Used  . Alcohol use Yes     Comment: occasional  . Drug use: No  . Sexual activity: Yes    Birth control/ protection: None   Other Topics Concern  . Not on file   Social History Narrative  . No narrative on file    Physical Exam  Pulmonary/Chest: No  respiratory distress. She has no wheezes. She has no rales.  Abdominal: She exhibits no distension. There is no tenderness. There is no guarding.  Musculoskeletal: She exhibits no edema.  Skin: Skin is warm and dry. She is not diaphoretic.        Future Appointments Date Time Provider [place]  07/06/2017 10:00 AM [facility] PA/NP [facility] None  09/03/2017 3:45 PM Tuchman, Leslye Peer, DPM [facility]    ATF pt CAO x4 sitting at the kitchen table eating lunch.  Pt stated that she has just gotten home from [place], she asked for deposit assistance for [employer].  Pt stated that she was told that they no longer assist with utililies.  I asked [name] (Education officer, museum with [facility]) If she know of any companies that could assist her with the deposit.  Pt is taking lisinopril and phentermine which are not documented in epic. shes not taking spirolactone, amlodipine, metolazone because of the effects on her kidneys (per pt).  Pt denies sob, dizziness, lightheadedness and chest pain.  rx bottles verified and pill box refilled.   ** pt is now taking tizanidine 4mg  as needed for pain  BP 122/60   Pulse 91   Resp 16   Wt 272 lb (123.4 kg)   LMP 02/16/2017 (Within Weeks)   SpO2 100%   BMI 51.39 kg/m   cbg 264 Weight yesterday-didn't weigh Last visit weight-269    [name], EMT Paramedic 06/26/2017   ACTION: Home visit completed

## 2017-06-26 NOTE — Telephone Encounter (Signed)
I called triage nurse to report that pt is no longer taking amlodipine and is taking lisinopril instead. Pt stated that she saw another physician this week and she told him/her that she was worried about her kidney function therefore those meds were changed.  Pt also advised me that she will no longer take spirolactone and metolazone.  I spoke with susie and advised her of the same.

## 2017-06-26 NOTE — Telephone Encounter (Signed)
CSW received call from [facility] paramedic requesting resources for patient to assist with [employer] bill deposit. Patient's daughter is moving out and patient needs to change bill into her name. CSW provided resources and will follow up with patient tomorrow. Raquel Sarna, [place], [place]

## 2017-06-27 ENCOUNTER — Telehealth (HOSPITAL_COMMUNITY): Payer: Self-pay | Admitting: Pharmacist

## 2017-06-27 ENCOUNTER — Telehealth (HOSPITAL_COMMUNITY): Payer: Self-pay | Admitting: Cardiology

## 2017-06-27 NOTE — Telephone Encounter (Signed)
Received notification from [facility] with paramedicine that [name] is no longer taking amlodipine, spironolactone or metolazone because they are "making my kidneys bad". She was started on lisinopril per her PCP that she saw last week because "it is better for her kidneys". Will forward to Jettie Booze, NP-C who will see her next week in clinic.   [name]. [name], PharmD, BCPS, CPP Clinical Pharmacist Pager: [phone] Phone: [phone] 06/27/2017 10:51 AM

## 2017-06-27 NOTE — Telephone Encounter (Signed)
-----   Message from Arbutus Leas, NP sent at 06/26/2017  9:57 AM EDT ----- Please tell her to discontinue Arlyce Harman. Thanks. Repeat BMET in one week.

## 2017-07-01 ENCOUNTER — Emergency Department (HOSPITAL_COMMUNITY): Payer: Medicaid Other

## 2017-07-01 ENCOUNTER — Observation Stay (HOSPITAL_COMMUNITY)
Admission: EM | Admit: 2017-07-01 | Discharge: 2017-07-04 | Disposition: A | Discharge status: Home / Self Care | Payer: Medicaid Other | Attending: Internal Medicine | Admitting: Internal Medicine

## 2017-07-01 ENCOUNTER — Encounter (HOSPITAL_COMMUNITY): Payer: Self-pay

## 2017-07-01 DIAGNOSIS — D649 Anemia, unspecified: Secondary | ICD-10-CM | POA: Insufficient documentation

## 2017-07-01 DIAGNOSIS — H409 Unspecified glaucoma: Secondary | ICD-10-CM | POA: Diagnosis not present

## 2017-07-01 DIAGNOSIS — Z7982 Long term (current) use of aspirin: Secondary | ICD-10-CM | POA: Diagnosis not present

## 2017-07-01 DIAGNOSIS — I1310 Hypertensive heart and chronic kidney disease without heart failure, with stage 1 through stage 4 chronic kidney disease, or unspecified chronic kidney disease: Secondary | ICD-10-CM | POA: Insufficient documentation

## 2017-07-01 DIAGNOSIS — Z9989 Dependence on other enabling machines and devices: Secondary | ICD-10-CM | POA: Insufficient documentation

## 2017-07-01 DIAGNOSIS — N183 Chronic kidney disease, stage 3 unspecified: Secondary | ICD-10-CM | POA: Diagnosis present

## 2017-07-01 DIAGNOSIS — Z87891 Personal history of nicotine dependence: Secondary | ICD-10-CM | POA: Diagnosis not present

## 2017-07-01 DIAGNOSIS — I10 Essential (primary) hypertension: Secondary | ICD-10-CM | POA: Diagnosis present

## 2017-07-01 DIAGNOSIS — I5032 Chronic diastolic (congestive) heart failure: Secondary | ICD-10-CM | POA: Insufficient documentation

## 2017-07-01 DIAGNOSIS — R112 Nausea with vomiting, unspecified: Secondary | ICD-10-CM | POA: Diagnosis not present

## 2017-07-01 DIAGNOSIS — E119 Type 2 diabetes mellitus without complications: Secondary | ICD-10-CM

## 2017-07-01 DIAGNOSIS — I5033 Acute on chronic diastolic (congestive) heart failure: Secondary | ICD-10-CM | POA: Diagnosis present

## 2017-07-01 DIAGNOSIS — E1122 Type 2 diabetes mellitus with diabetic chronic kidney disease: Secondary | ICD-10-CM | POA: Diagnosis not present

## 2017-07-01 DIAGNOSIS — D509 Iron deficiency anemia, unspecified: Secondary | ICD-10-CM | POA: Diagnosis present

## 2017-07-01 DIAGNOSIS — M10372 Gout due to renal impairment, left ankle and foot: Secondary | ICD-10-CM

## 2017-07-01 DIAGNOSIS — M79672 Pain in left foot: Secondary | ICD-10-CM | POA: Diagnosis not present

## 2017-07-01 DIAGNOSIS — Z794 Long term (current) use of insulin: Secondary | ICD-10-CM | POA: Insufficient documentation

## 2017-07-01 DIAGNOSIS — Z79899 Other long term (current) drug therapy: Secondary | ICD-10-CM | POA: Insufficient documentation

## 2017-07-01 DIAGNOSIS — R001 Bradycardia, unspecified: Secondary | ICD-10-CM | POA: Diagnosis present

## 2017-07-01 DIAGNOSIS — IMO0002 Reserved for concepts with insufficient information to code with codable children: Secondary | ICD-10-CM

## 2017-07-01 DIAGNOSIS — M25572 Pain in left ankle and joints of left foot: Secondary | ICD-10-CM | POA: Diagnosis present

## 2017-07-01 DIAGNOSIS — E1165 Type 2 diabetes mellitus with hyperglycemia: Secondary | ICD-10-CM

## 2017-07-01 DIAGNOSIS — G4733 Obstructive sleep apnea (adult) (pediatric): Secondary | ICD-10-CM | POA: Diagnosis not present

## 2017-07-01 DIAGNOSIS — J449 Chronic obstructive pulmonary disease, unspecified: Secondary | ICD-10-CM | POA: Insufficient documentation

## 2017-07-01 LAB — HEPATIC FUNCTION PANEL
ALK PHOS: 63 U/L (ref 38–126)
ALT: 12 U/L — ABNORMAL LOW (ref 14–54)
AST: 17 U/L (ref 15–41)
Albumin: 3.2 g/dL — ABNORMAL LOW (ref 3.5–5.0)
TOTAL PROTEIN: 8.2 g/dL — AB (ref 6.5–8.1)
Total Bilirubin: 0.5 mg/dL (ref 0.3–1.2)

## 2017-07-01 LAB — CBC WITH DIFFERENTIAL/PLATELET
BASOS PCT: 0 %
Basophils Absolute: 0 10*3/uL (ref 0.0–0.1)
EOS ABS: 0 10*3/uL (ref 0.0–0.7)
Eosinophils Relative: 0 %
HCT: 36.3 % (ref 36.0–46.0)
Hemoglobin: 11.2 g/dL — ABNORMAL LOW (ref 12.0–15.0)
LYMPHS ABS: 1.3 10*3/uL (ref 0.7–4.0)
Lymphocytes Relative: 10 %
MCH: 24.7 pg — AB (ref 26.0–34.0)
MCHC: 30.9 g/dL (ref 30.0–36.0)
MCV: 80.1 fL (ref 78.0–100.0)
MONO ABS: 0.4 10*3/uL (ref 0.1–1.0)
MONOS PCT: 3 %
Neutro Abs: 11.1 10*3/uL — ABNORMAL HIGH (ref 1.7–7.7)
Neutrophils Relative %: 87 %
Platelets: 190 10*3/uL (ref 150–400)
RBC: 4.53 MIL/uL (ref 3.87–5.11)
RDW: 15.9 % — AB (ref 11.5–15.5)
WBC: 12.8 10*3/uL — ABNORMAL HIGH (ref 4.0–10.5)

## 2017-07-01 LAB — BASIC METABOLIC PANEL
Anion gap: 10 (ref 5–15)
BUN: 38 mg/dL — AB (ref 6–20)
CALCIUM: 9.1 mg/dL (ref 8.9–10.3)
CO2: 24 mmol/L (ref 22–32)
CREATININE: 1.86 mg/dL — AB (ref 0.44–1.00)
Chloride: 103 mmol/L (ref 101–111)
GFR calc non Af Amer: 28 mL/min — ABNORMAL LOW (ref >60)
GFR, EST AFRICAN AMERICAN: 33 mL/min — AB (ref >60)
Glucose, Bld: 124 mg/dL — ABNORMAL HIGH (ref 65–99)
Potassium: 4.4 mmol/L (ref 3.5–5.1)
SODIUM: 137 mmol/L (ref 135–145)

## 2017-07-01 LAB — URIC ACID: Uric Acid, Serum: 14 mg/dL — ABNORMAL HIGH (ref 2.3–6.6)

## 2017-07-01 LAB — GLUCOSE, CAPILLARY: GLUCOSE-CAPILLARY: 161 mg/dL — AB (ref 65–99)

## 2017-07-01 LAB — LIPASE, BLOOD: LIPASE: 21 U/L (ref 11–51)

## 2017-07-01 MED ORDER — PHENTERMINE HCL 37.5 MG PO TABS: 37.5000 mg | ORAL_TABLET | Freq: Every day | ORAL | Status: DC

## 2017-07-01 MED ORDER — ASPIRIN EC 81 MG PO TBEC
81.0000 mg | DELAYED_RELEASE_TABLET | Freq: Every day | ORAL | Status: DC
  Administered 2017-07-02 – 2017-07-04 (×3): 81 mg via ORAL
  Filled 2017-07-01 (×3): qty 1

## 2017-07-01 MED ORDER — LATANOPROST 0.005 % OP SOLN
1.0000 [drp] | Freq: Every day | OPHTHALMIC | Status: DC
  Administered 2017-07-02 – 2017-07-03 (×3): 1 [drp] via OPHTHALMIC
  Filled 2017-07-01 (×2): qty 2.5

## 2017-07-01 MED ORDER — CHLORHEXIDINE GLUCONATE 0.12 % MT SOLN
15.0000 mL | Freq: Two times a day (BID) | OROMUCOSAL | Status: DC
  Administered 2017-07-02 – 2017-07-03 (×4): 15 mL via OROMUCOSAL
  Filled 2017-07-01 (×3): qty 15

## 2017-07-01 MED ORDER — ACETAMINOPHEN 325 MG PO TABS: 650.0000 mg | ORAL_TABLET | Freq: Four times a day (QID) | ORAL | Status: DC | PRN

## 2017-07-01 MED ORDER — INSULIN ASPART 100 UNIT/ML ~~LOC~~ SOLN
0.0000 [IU] | Freq: Three times a day (TID) | SUBCUTANEOUS | Status: DC
  Administered 2017-07-02: 2 [IU] via SUBCUTANEOUS
  Administered 2017-07-02: 1 [IU] via SUBCUTANEOUS
  Administered 2017-07-02: 7 [IU] via SUBCUTANEOUS
  Administered 2017-07-03: 5 [IU] via SUBCUTANEOUS
  Administered 2017-07-03: 12 [IU] via SUBCUTANEOUS
  Administered 2017-07-03: 2 [IU] via SUBCUTANEOUS

## 2017-07-01 MED ORDER — ONDANSETRON HCL 4 MG/2ML IJ SOLN: 4.0000 mg | Freq: Four times a day (QID) | INTRAMUSCULAR | Status: DC | PRN

## 2017-07-01 MED ORDER — ONDANSETRON HCL 4 MG PO TABS: 4.0000 mg | ORAL_TABLET | Freq: Four times a day (QID) | ORAL | Status: DC | PRN

## 2017-07-01 MED ORDER — HYDROMORPHONE HCL 1 MG/ML IJ SOLN
2.0000 mg | Freq: Once | INTRAMUSCULAR | Status: AC
  Administered 2017-07-01: 2 mg via INTRAMUSCULAR
  Filled 2017-07-01: qty 2

## 2017-07-01 MED ORDER — OXYCODONE-ACETAMINOPHEN 5-325 MG PO TABS: 1.0000 | ORAL_TABLET | Freq: Once | ORAL | Status: DC

## 2017-07-01 MED ORDER — DORZOLAMIDE HCL-TIMOLOL MAL 2-0.5 % OP SOLN
1.0000 [drp] | Freq: Two times a day (BID) | OPHTHALMIC | Status: DC
  Administered 2017-07-02 – 2017-07-04 (×6): 1 [drp] via OPHTHALMIC
  Filled 2017-07-01 (×2): qty 10

## 2017-07-01 MED ORDER — POTASSIUM CHLORIDE CRYS ER 20 MEQ PO TBCR
20.0000 meq | EXTENDED_RELEASE_TABLET | Freq: Every day | ORAL | Status: DC
  Administered 2017-07-02 – 2017-07-04 (×3): 20 meq via ORAL
  Filled 2017-07-01 (×3): qty 1

## 2017-07-01 MED ORDER — ALBUTEROL SULFATE (2.5 MG/3ML) 0.083% IN NEBU: 3.0000 mL | INHALATION_SOLUTION | Freq: Four times a day (QID) | RESPIRATORY_TRACT | Status: DC | PRN

## 2017-07-01 MED ORDER — ORAL CARE MOUTH RINSE
15.0000 mL | Freq: Two times a day (BID) | OROMUCOSAL | Status: DC
  Administered 2017-07-02 – 2017-07-03 (×4): 15 mL via OROMUCOSAL

## 2017-07-01 MED ORDER — TIZANIDINE HCL 2 MG PO TABS
4.0000 mg | ORAL_TABLET | Freq: Two times a day (BID) | ORAL | Status: DC | PRN
  Filled 2017-07-01: qty 2

## 2017-07-01 MED ORDER — PREDNISONE 20 MG PO TABS
40.0000 mg | ORAL_TABLET | Freq: Every day | ORAL | Status: DC
  Administered 2017-07-02 – 2017-07-04 (×3): 40 mg via ORAL
  Filled 2017-07-01 (×3): qty 2

## 2017-07-01 MED ORDER — ACETAMINOPHEN 650 MG RE SUPP: 650.0000 mg | Freq: Four times a day (QID) | RECTAL | Status: DC | PRN

## 2017-07-01 MED ORDER — ONDANSETRON 4 MG PO TBDP
4.0000 mg | ORAL_TABLET | Freq: Once | ORAL | Status: AC
  Administered 2017-07-01: 4 mg via ORAL
  Filled 2017-07-01: qty 1

## 2017-07-01 MED ORDER — PROMETHAZINE HCL 25 MG/ML IJ SOLN
12.5000 mg | Freq: Once | INTRAMUSCULAR | Status: AC
  Administered 2017-07-01: 12.5 mg via INTRAMUSCULAR
  Filled 2017-07-01: qty 1

## 2017-07-01 MED ORDER — DOXAZOSIN MESYLATE 4 MG PO TABS
4.0000 mg | ORAL_TABLET | Freq: Every day | ORAL | Status: DC
  Administered 2017-07-02 – 2017-07-03 (×3): 4 mg via ORAL
  Filled 2017-07-01 (×4): qty 1
  Filled 2017-07-01: qty 2

## 2017-07-01 MED ORDER — PREDNISONE 20 MG PO TABS
60.0000 mg | ORAL_TABLET | Freq: Once | ORAL | Status: AC
  Administered 2017-07-01: 60 mg via ORAL
  Filled 2017-07-01: qty 3

## 2017-07-01 MED ORDER — METOCLOPRAMIDE HCL 5 MG/ML IJ SOLN
10.0000 mg | Freq: Once | INTRAMUSCULAR | Status: AC
  Administered 2017-07-01: 10 mg via INTRAMUSCULAR
  Filled 2017-07-01: qty 2

## 2017-07-01 MED ORDER — ONDANSETRON 4 MG PO TBDP
4.0000 mg | ORAL_TABLET | Freq: Once | ORAL | Status: DC
  Filled 2017-07-01: qty 1

## 2017-07-01 MED ORDER — ACETAMINOPHEN-CODEINE #3 300-30 MG PO TABS
1.0000 | ORAL_TABLET | Freq: Four times a day (QID) | ORAL | Status: DC | PRN
  Filled 2017-07-01: qty 1

## 2017-07-01 MED ORDER — LISINOPRIL 5 MG PO TABS
5.0000 mg | ORAL_TABLET | Freq: Every day | ORAL | Status: DC
  Administered 2017-07-02 – 2017-07-04 (×3): 5 mg via ORAL
  Filled 2017-07-01 (×3): qty 1

## 2017-07-01 MED ORDER — INSULIN GLARGINE 100 UNIT/ML ~~LOC~~ SOLN
50.0000 [IU] | Freq: Every day | SUBCUTANEOUS | Status: DC
  Administered 2017-07-02 – 2017-07-04 (×3): 50 [IU] via SUBCUTANEOUS
  Filled 2017-07-01 (×5): qty 0.5

## 2017-07-01 MED ORDER — SODIUM CHLORIDE 0.9 % IV BOLUS (SEPSIS)
500.0000 mL | Freq: Once | INTRAVENOUS | Status: AC
  Administered 2017-07-01: 500 mL via INTRAVENOUS

## 2017-07-01 MED ORDER — TORSEMIDE 20 MG PO TABS
60.0000 mg | ORAL_TABLET | Freq: Every day | ORAL | Status: DC
  Administered 2017-07-02 – 2017-07-04 (×3): 60 mg via ORAL
  Filled 2017-07-01 (×3): qty 3

## 2017-07-01 NOTE — H&P (Signed)
History and Physical    [name] RDE:[id number] DOB: 05-Jan-1956 DOA: 07/01/2017  PCP: Javier Docker, MD  Patient coming from: home.  Chief Complaint: left foot pain.  HPI: [name] is a 61 y.o. female with history of CHF, sleep apnea, diabetes mellitus, hypertension and chronic kidney disease presents to the ER because of worsening left foot pain for last 2 days. Patient states she is unable to put pressure on the feet because of the pain. 2 months ago she was diagnosed with a right foot gout and was on steroid taper. Denies any trauma fever or chills.  ED Course: in the ER patient was given prednisone and Dilaudid  Following which patient was unable to keep anything and had multiple episodes of nausea vomiting. Patient was feeling weak and was admitted for further observation. Abdomen appears benign on exam. Patient already has a diagnosis of right foot Achilles tendon rupture.  Review of Systems: As per HPI, rest all negative.   Past Medical History:  Diagnosis Date  . Achilles rupture   . Anxiety   . Arthritis   . Asthma   . Chronic back pain   . Chronic headaches   . Chronic leg pain    bilaterally  . Depression   . Diabetes mellitus 1999   T2DM  . Glaucoma   . H/O gestational diabetes mellitus, not currently pregnant   . Hyperlipidemia   . Hypertension   . Morbid obesity ([place])   . OSA (obstructive sleep apnea)   . Sciatica     Past Surgical History:  Procedure Laterality Date  . BACK SURGERY    . BREAST LUMPECTOMY     Right breast  . CERVICAL SPINE SURGERY    . CESAREAN SECTION  12/10/89  . CHOLECYSTECTOMY  1991  . LUMBAR SPINE SURGERY    . RIGHT HEART CATH N/A 12/06/2016   Procedure: Right Heart Cath;  Surgeon: Larey Dresser, MD;  Location: [facility];  Service: Cardiovascular;  Laterality: N/A;  . TUBAL LIGATION       reports that she quit smoking about 18 years ago. Her smoking use included Cigarettes. She has never used smokeless  tobacco. She reports that she drinks alcohol. She reports that she does not use drugs.  Allergies  Allergen Reactions  . Pork-Derived Products Nausea And Vomiting    Family History  Problem Relation Age of Onset  . Heart failure Mother   . Renal Disease Mother     Prior to Admission medications   Medication Sig Start Date End Date Taking? Authorizing Provider  acetaminophen (TYLENOL) 500 MG tablet Take 500-1,000 mg by mouth every 6 (six) hours as needed for mild pain or headache.    Yes [provider]  acetaminophen-codeine (TYLENOL #3) 300-30 MG tablet Take 1 tablet by mouth every 6 (six) hours as needed (headache).    Yes [provider]  albuterol (PROVENTIL HFA;VENTOLIN HFA) 108 (90 BASE) MCG/ACT inhaler Inhale 2 puffs into the lungs every 6 (six) hours as needed for wheezing or shortness of breath.    Yes Robyn Haber, MD  aspirin EC 81 MG tablet Take 81 mg by mouth daily with breakfast.   Yes [provider]  dorzolamide-timolol (COSOPT) 22.3-6.8 MG/ML ophthalmic solution Place 1 drop into both eyes 2 (two) times daily.   Yes [provider]  doxazosin (CARDURA) 4 MG tablet Take 4 mg by mouth at bedtime.   Yes [provider]  insulin aspart (NOVOLOG  FLEXPEN) 100 UNIT/ML FlexPen Inject 3-20 Units into the skin 3 (three) times daily as needed for high blood sugar (CBG 150).   Yes [provider]  insulin glargine (LANTUS) 100 unit/mL SOPN Inject 50 Units into the skin daily before breakfast.   Yes [provider]  lisinopril (PRINIVIL,ZESTRIL) 5 MG tablet Take 5 mg by mouth daily with breakfast.  06/18/17  Yes [provider]  phentermine (ADIPEX-P) 37.5 MG tablet Take 37.5 mg by mouth daily with breakfast.  06/18/17  Yes [provider]  potassium chloride SA (K-DUR,KLOR-CON) 20 MEQ tablet Take 1 tablet (20 mEq total) by mouth daily. 05/24/17  Yes Shirley Friar, PA-C  PRESCRIPTION MEDICATION  Inhale into the lungs at bedtime. CPAP   Yes [provider]  tiZANidine (ZANAFLEX) 4 MG tablet Take 4 mg by mouth every 12 (twelve) hours as needed for muscle spasms. Not to exceed 3 doses in 24 hours 06/22/17  Yes [provider]  torsemide (DEMADEX) 20 MG tablet Take 3 tablets (60 mg total) by mouth 2 (two) times daily. Patient taking differently: Take 60 mg by mouth daily before breakfast.  06/15/17  Yes Jettie Booze E, NP  Travoprost, BAK Free, (TRAVATAN) 0.004 % SOLN ophthalmic solution Place 1 drop into both eyes at bedtime.   Yes [provider]    Physical Exam: Vitals:   07/01/17 1351 07/01/17 1630 07/01/17 1819 07/01/17 2035  BP: (!) 159/57 (!) 178/85 (!) 171/68 (!) 161/63  Pulse: 66 60 60 61  Resp: 16 16 18 20   Temp:      TempSrc:      SpO2: 96% 98% 96% 100%      Constitutional: moderately built and nourished. Vitals:   07/01/17 1351 07/01/17 1630 07/01/17 1819 07/01/17 2035  BP: (!) 159/57 (!) 178/85 (!) 171/68 (!) 161/63  Pulse: 66 60 60 61  Resp: 16 16 18 20   Temp:      TempSrc:      SpO2: 96% 98% 96% 100%   Eyes: anicteric. No pallor. ENMT: no discharge from the ears eyes nose and mouth. Neck: no JVD appreciated no mass felt. Respiratory: no rhonchi or crepitations. Cardiovascular: S1-S2 heard no murmurs appreciated. Abdomen: soft nontender bowel sounds present. Musculoskeletal: eft foot is tender and swollen. Skin: mildly erythematous left foot. Neurologic:alert awake oriented to time place and person.Moves all extremities. Psychiatric: appears normal. Normal affect.   Labs on Admission: I have personally reviewed following labs and imaging studies  CBC:  Recent Labs Lab 07/01/17 1935  WBC 12.8*  NEUTROABS 11.1*  HGB 11.2*  HCT 36.3  MCV 80.1  PLT 767   Basic Metabolic Panel:  Recent Labs Lab 06/25/17 0949 07/01/17 1935  NA 137 137  K 4.3 4.4  CL 103 103  CO2 26 24  GLUCOSE 226* 124*  BUN 41* 38*  CREATININE  2.18* 1.86*  CALCIUM 8.8* 9.1   GFR: Estimated Creatinine Clearance: 39.6 mL/min (A) (by C-G formula based on SCr of 1.86 mg/dL (H)). Liver Function Tests: No results for input(s): AST, ALT, ALKPHOS, BILITOT, PROT, ALBUMIN in the last 168 hours. No results for input(s): LIPASE, AMYLASE in the last 168 hours. No results for input(s): AMMONIA in the last 168 hours. Coagulation Profile: No results for input(s): INR, PROTIME in the last 168 hours. Cardiac Enzymes: No results for input(s): CKTOTAL, CKMB, CKMBINDEX, TROPONINI in the last 168 hours. BNP (last 3 results)  Recent Labs  01/18/17 1209  PROBNP 136   HbA1C:  No results for input(s): HGBA1C in the last 72 hours. CBG: No results for input(s): GLUCAP in the last 168 hours. Lipid Profile: No results for input(s): CHOL, HDL, LDLCALC, TRIG, CHOLHDL, LDLDIRECT in the last 72 hours. Thyroid Function Tests: No results for input(s): TSH, T4TOTAL, FREET4, T3FREE, THYROIDAB in the last 72 hours. Anemia Panel: No results for input(s): VITAMINB12, FOLATE, FERRITIN, TIBC, IRON, RETICCTPCT in the last 72 hours. Urine analysis:    Component Value Date/Time   COLORURINE YELLOW 11/30/2016 1829   APPEARANCEUR CLEAR 11/30/2016 1829   LABSPEC 1.022 11/30/2016 1829   PHURINE 6.0 11/30/2016 1829   GLUCOSEU >=500 (A) 11/30/2016 1829   HGBUR NEGATIVE 11/30/2016 1829   HGBUR negative 08/20/2009 [place] 11/30/2016 1829   BILIRUBINUR neg 05/14/2013 1111   KETONESUR NEGATIVE 11/30/2016 1829   PROTEINUR NEGATIVE 11/30/2016 1829   UROBILINOGEN 1.0 11/11/2014 1215   NITRITE NEGATIVE 11/30/2016 1829   LEUKOCYTESUR TRACE (A) 11/30/2016 1829   Sepsis Labs: @LABRCNTIP (procalcitonin:4,lacticidven:4) )No results found for this or any previous visit (from the past 240 hour(s)).   Radiological Exams on Admission: Dg Foot Complete Left  Result Date: 07/01/2017 CLINICAL DATA:  Left foot pain x1 day EXAM: LEFT FOOT - COMPLETE 3+  VIEW COMPARISON:  04/06/2014 FINDINGS: No fracture or dislocation is seen. Degenerative changes the dorsal midfoot. Small plantar and posterior calcaneal enthesophyte. Vascular calcifications. Mild soft tissue swelling/left lower extremity edema. IMPRESSION: No acute osseus abnormality is seen. Electronically Signed   By: Julian Hy M.D.   On: 07/01/2017 13:15     Assessment/Plan Principal Problem:   Nausea & vomiting Active Problems:   Diabetes mellitus (HCC)   Iron deficiency anemia   OSA (obstructive sleep apnea)   Hypertension   Chronic diastolic heart failure (HCC)   CKD (chronic kidney disease) stage 3, GFR 30-59 ml/min (HCC)   Sinus bradycardia    1. Nausea vomiting - likely medication induced. Abdomen appears benign.We'll check LFTs and lipase. If nausea vomiting persist may consider further imaging. Patient has had previous history of cholecystectomy. 2. Left foot pain likely from gout - will continue prednisone. Check uric acid leve. 3. Diabetes mellitus type 2 - in the setting of prednisone CBG is likely to go up. Patient is on Lantus. I have placed patient on sliding scale coverage. 4. History of diastolic CHF last EF measured in Mar8 was 60-65% on Demadex.Likely precipitating gout. 5. Sleep apnea on CPAP. 6. Hypertension on Cardura. 7. Chronic kidney disease stage 3-4 - creatinine appears to be at baseline. 8. Anemia - hemoglobin appears to be at baseline.  I have reviewed patient's old charts labs.   DVT prophylaxis: SCDs. Patient has hypersensitivity to pork derived products. Code Status: full code.  Family Communication: iscussed with patient.  Disposition Plan:  Home.  Consults called: physical therapy.  Admission status:  Observation.    Rise Patience MD [facility] Pager [phone].  If 7PM-7AM, please contact night-coverage [email or web address] Password [email or web address]  07/01/2017, 9:57 PM

## 2017-07-01 NOTE — ED Triage Notes (Signed)
Patient complains of left foot pain x 1 day, pain worse with ambulation. Denies trauma

## 2017-07-01 NOTE — ED Triage Notes (Signed)
Pt sitting on side of stretcher , vomited x2 .

## 2017-07-01 NOTE — ED Triage Notes (Signed)
Pt assisted to BR via [place] with help of Daughter.

## 2017-07-01 NOTE — Plan of Care (Signed)
Problem: Safety: Goal: Ability to remain free from injury will improve Outcome: Progressing Bed alarm on. Call bell within reach Assist x1/2 No fall this shift  Problem: Pain Managment: Goal: General experience of comfort will improve Outcome: Progressing Tyelnol #3 Q6 PRN in place

## 2017-07-01 NOTE — ED Triage Notes (Signed)
Pt has been sitting up in [facility] and requested to return to bed. Pt assisted back to bed.

## 2017-07-01 NOTE — ED Provider Notes (Signed)
[facility] DEPT Provider Note   CSN: [id number] Arrival date & time: 07/01/17  1148     History   Chief Complaint No chief complaint on file.   HPI [name] is a 61 y.o. female.  Patient is a 61 year old female with a past medical history of morbid obesity, hypertension, hyperlipidemia, diabetes, CHF, chronic kidney disease presenting with left ankle pain. Patient states she was feeling fine on Friday and woke up Saturday morning with severe pain in the left ankle. The pain is a 10 out of 10 and severe when trying to walk. Because it is hurting so badly she is unable to walk. She denies any injury. She did not do anything new on Friday denies any new shoes or other issues. She has recently been taking more Lasix for diuresis due to fluid overload but was recently told that her renal function was rising and she needed to slow down on her Lasix. Patient denies fever or infectious symptoms. No recent surgeries or procedures done to the foot. Daughter had noted that patient's foot was warm to the touch today.   The history is provided by the patient.    Past Medical History:  Diagnosis Date  . Achilles rupture   . Anxiety   . Arthritis   . Asthma   . Chronic back pain   . Chronic headaches   . Chronic leg pain    bilaterally  . Depression   . Diabetes mellitus 1999   T2DM  . Glaucoma   . H/O gestational diabetes mellitus, not currently pregnant   . Hyperlipidemia   . Hypertension   . Morbid obesity ([place])   . OSA (obstructive sleep apnea)   . Sciatica     Patient Active Problem List   Diagnosis Date Noted  . Hypertensive heart and renal disease   . SOB (shortness of breath)   . Chronic diastolic heart failure ([place]) 11/30/2016  . CKD (chronic kidney disease) stage 3, GFR 30-59 ml/min (HCC) 11/30/2016  . Thrombocytopenia ([place]) 11/30/2016  . Sinus bradycardia 11/30/2016  . PMB (postmenopausal bleeding) 05/09/2010  . ANEMIA 05/05/2010  . DEPRESSION 04/26/2010    . TMJ SYNDROME 04/26/2010  . DEGENERATIVE DISC DISEASE, CERVICAL SPINE 04/26/2010  . CHEST PAIN 04/26/2010  . CHRONIC OBSTRUCTIVE PULMONARY DISEASE, MODERATE 03/26/2010  . MASTALGIA 10/08/2009  . Iron deficiency anemia 07/12/2009  . Diabetes mellitus ([place]) 07/02/2009  . Hyperlipidemia 07/02/2009  . BMI 45.0-49.9, adult ([place]) 07/02/2009  . OSA (obstructive sleep apnea) 07/02/2009  . Glaucoma 07/02/2009  . Hypertension 07/02/2009  . ASTHMA 07/02/2009  . LOW BACK PAIN SYNDROME 07/02/2009  . BACK PAIN 07/02/2009  . PERIPHERAL EDEMA 07/02/2009  . GROIN PAIN 07/02/2009  . TOBACCO USE, QUIT 07/02/2009  . HEMORRHOIDS, INTERNAL 07/16/2008  . DIVERTICULOSIS OF COLON 07/16/2008    Past Surgical History:  Procedure Laterality Date  . BACK SURGERY    . BREAST LUMPECTOMY     Right breast  . CERVICAL SPINE SURGERY    . CESAREAN SECTION  12/10/89  . CHOLECYSTECTOMY  1991  . LUMBAR SPINE SURGERY    . RIGHT HEART CATH N/A 12/06/2016   Procedure: Right Heart Cath;  Surgeon: Larey Dresser, MD;  Location: [facility];  Service: Cardiovascular;  Laterality: N/A;  . TUBAL LIGATION      OB History    Gravida Para Term Preterm AB Living   1 1       1    SAB TAB Ectopic Multiple Live Births  Home Medications    Prior to Admission medications   Medication Sig Start Date End Date Taking? Authorizing Provider  acetaminophen (TYLENOL) 500 MG tablet Take 1,000 mg by mouth every 6 (six) hours as needed for mild pain.     [provider]  acetaminophen-codeine (TYLENOL #3) 300-30 MG tablet Take 1 tablet by mouth every 6 (six) hours as needed for moderate pain.    [provider]  albuterol (PROVENTIL HFA;VENTOLIN HFA) 108 (90 BASE) MCG/ACT inhaler Inhale 2 puffs into the lungs every 6 (six) hours as needed for wheezing or shortness of breath. For shortness of breath    Robyn Haber, MD  dorzolamide-timolol (COSOPT) 22.3-6.8 MG/ML ophthalmic solution  Place 1 drop into both eyes 2 (two) times daily.    [provider]  doxazosin (CARDURA) 4 MG tablet Take 4 mg by mouth at bedtime.    [provider]  insulin aspart (NOVOLOG) 100 UNIT/ML injection Inject into the skin 3 (three) times daily before meals. Per sliding scale    [provider]  insulin glargine (LANTUS) 100 UNIT/ML injection Inject 50 Units into the skin at bedtime.    [provider]  lisinopril (PRINIVIL,ZESTRIL) 5 MG tablet Take 5 mg by mouth daily. 06/18/17   [provider]  phentermine (ADIPEX-P) 37.5 MG tablet Take 37.5 mg by mouth daily. 06/18/17   [provider]  potassium chloride SA (K-DUR,KLOR-CON) 20 MEQ tablet Take 1 tablet (20 mEq total) by mouth daily. 05/24/17   Shirley Friar, PA-C  tiZANidine (ZANAFLEX) 4 MG tablet Take 4 mg by mouth every 12 (twelve) hours as needed for muscle spasms. Not to exceed 3 doses in 24 hours 06/22/17   [provider]  torsemide (DEMADEX) 20 MG tablet Take 3 tablets (60 mg total) by mouth 2 (two) times daily. Patient taking differently: Take 60 mg by mouth daily.  06/15/17   Arbutus Leas, NP  Travoprost, BAK Free, (TRAVATAN) 0.004 % SOLN ophthalmic solution Place 1 drop into both eyes at bedtime.    [provider]    Family History Family History  Problem Relation Age of Onset  . Heart failure Mother   . Renal Disease Mother     Social History Social History  Substance Use Topics  . Smoking status: Former Smoker    Types: Cigarettes    Quit date: 09/04/1998  . Smokeless tobacco: Never Used  . Alcohol use Yes     Comment: occasional     Allergies   Pork-derived products   Review of Systems Review of Systems  All other systems reviewed and are negative.    Physical Exam Updated Vital Signs BP (!) 150/52   Pulse (!) 59   Temp 98.5 F (36.9 C) (Oral)   Resp 18   LMP 02/16/2017 (Within Weeks)   SpO2 100%   Physical Exam    Constitutional: She is oriented to person, place, and time. She appears well-developed and well-nourished. No distress.  HENT:  Head: Normocephalic and atraumatic.  Eyes: Pupils are equal, round, and reactive to light.  Cardiovascular: Normal rate.   Pulmonary/Chest: Effort normal.  Musculoskeletal: She exhibits edema and tenderness.       Left ankle: She exhibits decreased range of motion and swelling. She exhibits no ecchymosis, no deformity and normal pulse. No CF ligament, no posterior TFL and no head of 5th metatarsal tenderness found.       Feet:  1+ edema in bilateral lower ext  Neurological: She  is alert and oriented to person, place, and time.  Skin: Skin is warm and dry.  Psychiatric: She has a normal mood and affect. Her behavior is normal.  Nursing note and vitals reviewed.    ED Treatments / Results  Labs (all labs ordered are listed, but only abnormal results are displayed) Labs Reviewed - No data to display  EKG  EKG Interpretation None       Radiology Dg Foot Complete Left  Result Date: 07/01/2017 CLINICAL DATA:  Left foot pain x1 day EXAM: LEFT FOOT - COMPLETE 3+ VIEW COMPARISON:  04/06/2014 FINDINGS: No fracture or dislocation is seen. Degenerative changes the dorsal midfoot. Small plantar and posterior calcaneal enthesophyte. Vascular calcifications. Mild soft tissue swelling/left lower extremity edema. IMPRESSION: No acute osseus abnormality is seen. Electronically Signed   By: Julian Hy M.D.   On: 07/01/2017 13:15    Procedures Procedures (including critical care time)  Medications Ordered in ED Medications  predniSONE (DELTASONE) tablet 60 mg (not administered)  HYDROmorphone (DILAUDID) injection 2 mg (not administered)  ondansetron (ZOFRAN-ODT) disintegrating tablet 4 mg (not administered)     Initial Impression / Assessment and Plan / ED Course  I have reviewed the triage vital signs and the nursing notes.  Pertinent labs & imaging  results that were available during my care of the patient were reviewed by me and considered in my medical decision making (see chart for details).     Patient presenting with new onset of ankle pain starting yesterday. Patient initially said her foot but has significant pain with range of motion of her ankle. There is mild swelling and warmth to the joint. Symptoms are most consistent with gout. She has no infectious symptoms and low suspicion for septic joint. Patient recently was undergoing diuresis for her CHF which has made her renal function worse which could also have been a precipitate her of this event. Patient has significant pain is been unable to walk at home. She usually uses a walker and a cane because she had an Achilles rupture on her right foot and has difficulty getting around. Because of patient's renal disease unable to take NSAIDs. We will give prednisone and pain control with narcotics. Patient is aware that prednisone will elevate her blood sugar and she is using an insulin sliding scale and will adjust accordingly.  2:44 PM Pt nauseated after pain medication and vomiting.  Given reglan.  5:14 PM Pt's pain is improved and she is able to bear some weight but still vomiting from pain meds.  Given second dose of odt zofran.  Bedside blood sugar on pt's machine is 95 and she was given some saltine crackers.  6:38 PM Pt continues to vomit after reglan, 2 doses of zofran and phenergan.  She can't hold anything down.  Will admit for obs until tolerating po's and able to walk.  8:41 PM Pt's labs without acute findings and renal function improved from last week once she decreased lasix.  Pt still nauseated but vomiting better after phenergan but very drowsy.  Final Clinical Impressions(s) / ED Diagnoses   Final diagnoses:  Acute gout due to renal impairment involving left ankle  Intractable vomiting with nausea, unspecified vomiting type    New Prescriptions New Prescriptions     No medications on file     Blanchie Dessert, MD 07/01/17 2042

## 2017-07-01 NOTE — ED Notes (Signed)
Attempted report 

## 2017-07-02 DIAGNOSIS — Z794 Long term (current) use of insulin: Secondary | ICD-10-CM

## 2017-07-02 DIAGNOSIS — E1165 Type 2 diabetes mellitus with hyperglycemia: Secondary | ICD-10-CM | POA: Diagnosis not present

## 2017-07-02 DIAGNOSIS — M10372 Gout due to renal impairment, left ankle and foot: Secondary | ICD-10-CM

## 2017-07-02 DIAGNOSIS — R112 Nausea with vomiting, unspecified: Secondary | ICD-10-CM | POA: Diagnosis not present

## 2017-07-02 DIAGNOSIS — N183 Chronic kidney disease, stage 3 (moderate): Secondary | ICD-10-CM

## 2017-07-02 LAB — CBC
HEMATOCRIT: 30.6 % — AB (ref 36.0–46.0)
Hemoglobin: 9.4 g/dL — ABNORMAL LOW (ref 12.0–15.0)
MCH: 24.7 pg — ABNORMAL LOW (ref 26.0–34.0)
MCHC: 30.7 g/dL (ref 30.0–36.0)
MCV: 80.5 fL (ref 78.0–100.0)
PLATELETS: 192 10*3/uL (ref 150–400)
RBC: 3.8 MIL/uL — ABNORMAL LOW (ref 3.87–5.11)
RDW: 15.9 % — AB (ref 11.5–15.5)
WBC: 11.5 10*3/uL — AB (ref 4.0–10.5)

## 2017-07-02 LAB — BASIC METABOLIC PANEL
Anion gap: 9 (ref 5–15)
BUN: 37 mg/dL — AB (ref 6–20)
CO2: 24 mmol/L (ref 22–32)
Calcium: 8.5 mg/dL — ABNORMAL LOW (ref 8.9–10.3)
Chloride: 106 mmol/L (ref 101–111)
Creatinine, Ser: 1.79 mg/dL — ABNORMAL HIGH (ref 0.44–1.00)
GFR calc Af Amer: 34 mL/min — ABNORMAL LOW (ref >60)
GFR, EST NON AFRICAN AMERICAN: 30 mL/min — AB (ref >60)
Glucose, Bld: 240 mg/dL — ABNORMAL HIGH (ref 65–99)
POTASSIUM: 4.4 mmol/L (ref 3.5–5.1)
SODIUM: 139 mmol/L (ref 135–145)

## 2017-07-02 LAB — GLUCOSE, CAPILLARY
Glucose-Capillary: 137 mg/dL — ABNORMAL HIGH (ref 65–99)
Glucose-Capillary: 176 mg/dL — ABNORMAL HIGH (ref 65–99)
Glucose-Capillary: 339 mg/dL — ABNORMAL HIGH (ref 65–99)
Glucose-Capillary: 419 mg/dL — ABNORMAL HIGH (ref 65–99)

## 2017-07-02 LAB — HIV ANTIBODY (ROUTINE TESTING W REFLEX): HIV SCREEN 4TH GENERATION: NONREACTIVE

## 2017-07-02 MED ORDER — INSULIN ASPART 100 UNIT/ML ~~LOC~~ SOLN
8.0000 [IU] | Freq: Once | SUBCUTANEOUS | Status: AC
Start: 2017-07-02 — End: 2017-07-02
  Administered 2017-07-02: 8 [IU] via SUBCUTANEOUS

## 2017-07-02 NOTE — Progress Notes (Signed)
PROGRESS NOTE    Nahomi Hegner  IRJ:[id number] DOB: 26-Jul-1956 DOA: 07/01/2017 PCP: Javier Docker, MD   Outpatient Specialists:     Brief Narrative:   Nakeitha Milligan is a 61 y.o. female with history of CHF, sleep apnea, diabetes mellitus, hypertension and chronic kidney disease presents to the ER because of worsening left foot pain for last 2 days. Patient states she is unable to put pressure on the feet because of the pain. 2 months ago she was diagnosed with a right foot gout and was on steroid taper. Denies any trauma fever or chills.  Assessment & Plan:   Principal Problem:   Nausea & vomiting Active Problems:   Diabetes mellitus (HCC)   Iron deficiency anemia   OSA (obstructive sleep apnea)   Hypertension   Chronic diastolic heart failure (HCC)   CKD (chronic kidney disease) stage 3, GFR 30-59 ml/min (HCC)   Sinus bradycardia   Nausea vomiting -  Resolved, suspect due to dilaudid  Left foot pain likely from gout  -uric acid level elevated -will need chronic medications-- will defer to PCP once flare over -prednisone taper for now-- careful attention to blood sugars -will need PT Eval (right leg with achilles tendon tear)  Diabetes mellitus type 2  -lantus -SSI  History of diastolic CHF last EF measured in Mar8 was 60-65% on Demadex.Likely precipitating gout.  Sleep apnea on CPAP.  Hypertension on Cardura.  Chronic kidney disease stage 3-4 - creatinine appears to be at baseline.  Anemia - hemoglobin appears to be at baseline.   DVT prophylaxis:  SCD's  Code Status: Full Code   Family Communication:   Disposition Plan:     Consultants:    Subjective: Pain better Hard time walking as her right achilles tendon is torn  Objective: Vitals:   07/01/17 2314 07/02/17 0026 07/02/17 0501 07/02/17 0800  BP: (!) 142/52 (!) 150/60 (!) 124/49 (!) 124/47  Pulse: 64  (!) 59 65  Resp: 17  17 16   Temp: 98.6 F (37 C)  98.2 F (36.8 C) 98.4 F  (36.9 C)  TempSrc:    Oral  SpO2: 100%  100% 99%  Weight: 121 kg (266 lb 12.1 oz)     Height: 5\' 1"  (1.549 m)       Intake/Output Summary (Last 24 hours) at 07/02/17 1250 Last data filed at 07/02/17 0930  Gross per 24 hour  Intake              980 ml  Output                0 ml  Net              980 ml   Filed Weights   07/01/17 2314  Weight: 121 kg (266 lb 12.1 oz)    Examination:  General exam: Appears calm and comfortable  Respiratory system: Clear to auscultation. Respiratory effort normal. Cardiovascular system: S1 & S2 heard, RRR. No JVD, murmurs, rubs, gallops or clicks. No pedal edema. Gastrointestinal system: Abdomen is nondistended, soft and nontender. No organomegaly or masses felt. Normal bowel sounds heard. Central nervous system: Alert and oriented. No focal neurological deficits. Extremities: Symmetric 5 x 5 power. Skin: No rashes, lesions or ulcers Psychiatry: Judgement and insight appear normal. Mood & affect appropriate.     Data Reviewed: I have personally reviewed following labs and imaging studies  CBC:  Recent Labs Lab 07/01/17 1935 07/02/17 0258  WBC 12.8* 11.5*  NEUTROABS 11.1*  --  HGB 11.2* 9.4*  HCT 36.3 30.6*  MCV 80.1 80.5  PLT 190 267   Basic Metabolic Panel:  Recent Labs Lab 07/01/17 1935 07/02/17 0258  NA 137 139  K 4.4 4.4  CL 103 106  CO2 24 24  GLUCOSE 124* 240*  BUN 38* 37*  CREATININE 1.86* 1.79*  CALCIUM 9.1 8.5*   GFR: Estimated Creatinine Clearance: 40.7 mL/min (A) (by C-G formula based on SCr of 1.79 mg/dL (H)). Liver Function Tests:  Recent Labs Lab 07/01/17 2213  AST 17  ALT 12*  ALKPHOS 63  BILITOT 0.5  PROT 8.2*  ALBUMIN 3.2*    Recent Labs Lab 07/01/17 2213  LIPASE 21   No results for input(s): AMMONIA in the last 168 hours. Coagulation Profile: No results for input(s): INR, PROTIME in the last 168 hours. Cardiac Enzymes: No results for input(s): CKTOTAL, CKMB, CKMBINDEX, TROPONINI in  the last 168 hours. BNP (last 3 results)  Recent Labs  01/18/17 1209  PROBNP 136   HbA1C: No results for input(s): HGBA1C in the last 72 hours. CBG:  Recent Labs Lab 07/01/17 2309 07/02/17 0743 07/02/17 1237  GLUCAP 161* 137* 176*   Lipid Profile: No results for input(s): CHOL, HDL, LDLCALC, TRIG, CHOLHDL, LDLDIRECT in the last 72 hours. Thyroid Function Tests: No results for input(s): TSH, T4TOTAL, FREET4, T3FREE, THYROIDAB in the last 72 hours. Anemia Panel: No results for input(s): VITAMINB12, FOLATE, FERRITIN, TIBC, IRON, RETICCTPCT in the last 72 hours. Urine analysis:    Component Value Date/Time   COLORURINE YELLOW 11/30/2016 1829   APPEARANCEUR CLEAR 11/30/2016 1829   LABSPEC 1.022 11/30/2016 1829   PHURINE 6.0 11/30/2016 1829   GLUCOSEU >=500 (A) 11/30/2016 1829   HGBUR NEGATIVE 11/30/2016 1829   HGBUR negative 08/20/2009 0807   BILIRUBINUR NEGATIVE 11/30/2016 1829   BILIRUBINUR neg 05/14/2013 1111   KETONESUR NEGATIVE 11/30/2016 1829   PROTEINUR NEGATIVE 11/30/2016 1829   UROBILINOGEN 1.0 11/11/2014 1215   NITRITE NEGATIVE 11/30/2016 1829   LEUKOCYTESUR TRACE (A) 11/30/2016 1829    )No results found for this or any previous visit (from the past 240 hour(s)).    Anti-infectives    None       Radiology Studies: Dg Foot Complete Left  Result Date: 07/01/2017 CLINICAL DATA:  Left foot pain x1 day EXAM: LEFT FOOT - COMPLETE 3+ VIEW COMPARISON:  04/06/2014 FINDINGS: No fracture or dislocation is seen. Degenerative changes the dorsal midfoot. Small plantar and posterior calcaneal enthesophyte. Vascular calcifications. Mild soft tissue swelling/left lower extremity edema. IMPRESSION: No acute osseus abnormality is seen. Electronically Signed   By: Julian Hy M.D.   On: 07/01/2017 13:15        Scheduled Meds: . aspirin EC  81 mg Oral Q breakfast  . chlorhexidine  15 mL Mouth Rinse BID  . dorzolamide-timolol  1 drop Both Eyes BID  . doxazosin   4 mg Oral QHS  . insulin aspart  0-9 Units Subcutaneous TID WC  . insulin glargine  50 Units Subcutaneous QAC breakfast  . latanoprost  1 drop Both Eyes QHS  . lisinopril  5 mg Oral Q breakfast  . mouth rinse  15 mL Mouth Rinse q12n4p  . phentermine  37.5 mg Oral Q breakfast  . potassium chloride SA  20 mEq Oral Daily  . predniSONE  40 mg Oral Q breakfast  . torsemide  60 mg Oral QAC breakfast   Continuous Infusions:   LOS: 0 days    Time spent: 35 min  [place], DO [facility] Pager [phone]  If 7PM-7AM, please contact night-coverage [email or web address] Password [email or web address] 07/02/2017, 12:50 PM

## 2017-07-02 NOTE — Plan of Care (Signed)
Problem: Health Behavior/Discharge Planning: Goal: Ability to manage health-related needs will improve Outcome: Progressing Patient reports that pain in foot is much better and more manageable. Pt has not had any more nausea/vomitting this am.

## 2017-07-02 NOTE — Progress Notes (Signed)
RT set up patient CPAP. Patient is able to place herself on and off CPAP. NO assistance needed. No O2 bleed in needed.

## 2017-07-02 NOTE — Progress Notes (Addendum)
RT set up patient CPAP HS. RT placed patient on auto 44min and 47max. Patient can place herself on when ready.

## 2017-07-03 DIAGNOSIS — R112 Nausea with vomiting, unspecified: Secondary | ICD-10-CM | POA: Diagnosis not present

## 2017-07-03 DIAGNOSIS — M10372 Gout due to renal impairment, left ankle and foot: Secondary | ICD-10-CM | POA: Diagnosis not present

## 2017-07-03 DIAGNOSIS — Z794 Long term (current) use of insulin: Secondary | ICD-10-CM | POA: Diagnosis not present

## 2017-07-03 DIAGNOSIS — E1165 Type 2 diabetes mellitus with hyperglycemia: Secondary | ICD-10-CM | POA: Diagnosis not present

## 2017-07-03 LAB — GLUCOSE, CAPILLARY
GLUCOSE-CAPILLARY: 385 mg/dL — AB (ref 65–99)
Glucose-Capillary: 187 mg/dL — ABNORMAL HIGH (ref 65–99)
Glucose-Capillary: 277 mg/dL — ABNORMAL HIGH (ref 65–99)
Glucose-Capillary: 446 mg/dL — ABNORMAL HIGH (ref 65–99)

## 2017-07-03 LAB — GLUCOSE, RANDOM: GLUCOSE: 422 mg/dL — AB (ref 65–99)

## 2017-07-03 MED ORDER — HYDROCODONE-ACETAMINOPHEN 5-325 MG PO TABS: 1.0000 | ORAL_TABLET | ORAL | Status: DC | PRN

## 2017-07-03 MED ORDER — INSULIN ASPART 100 UNIT/ML ~~LOC~~ SOLN
0.0000 [IU] | Freq: Three times a day (TID) | SUBCUTANEOUS | Status: DC
  Administered 2017-07-04: 3 [IU] via SUBCUTANEOUS
  Administered 2017-07-04: 11 [IU] via SUBCUTANEOUS
  Administered 2017-07-04: 4 [IU] via SUBCUTANEOUS

## 2017-07-03 MED ORDER — INSULIN ASPART 100 UNIT/ML ~~LOC~~ SOLN
0.0000 [IU] | Freq: Every day | SUBCUTANEOUS | Status: DC
  Administered 2017-07-03: 5 [IU] via SUBCUTANEOUS

## 2017-07-03 NOTE — Care Management Note (Signed)
Case Management Note  Patient Details  Name: [name] MRN: [id number] Date of Birth: 1955/11/04  Subjective/Objective:        CM following for progression and d/c planning.             Action/Plan: 07/03/2017 Per PT eval this pt is having much difficulty with pain and is unable to stand with out assistance and has much difficulty with transfers to wheelchair. May need wheelchair to d/c, but must learn to transfer as she lives alone.   Expected Discharge Date:                  Expected Discharge Plan:  [place]  In-House Referral:  NA  Discharge planning Services  CM Consult  Post Acute Care Choice:    Choice offered to:     DME Arranged:    DME Agency:     HH Arranged:    HH Agency:     Status of Service:  In process, will continue to follow  If discussed at Long Length of Stay Meetings, dates discussed:    Additional Comments:  Adron Bene, RN 07/03/2017, 2:41 PM

## 2017-07-03 NOTE — Progress Notes (Signed)
PROGRESS NOTE    [name]  MGQ:[id number] DOB: 11/11/1955 DOA: 07/01/2017 PCP: Javier Docker, MD   Outpatient Specialists:     Brief Narrative:   [name] is a 61 y.o. female with history of CHF, sleep apnea, diabetes mellitus, hypertension and chronic kidney disease presents to the ER because of worsening left foot pain for last 2 days. Patient states she is unable to put pressure on the feet because of the pain. 2 months ago she was diagnosed with a right foot gout and was on steroid taper. Denies any trauma fever or chills.  Assessment & Plan:   Principal Problem:   Nausea & vomiting Active Problems:   Diabetes mellitus (HCC)   Iron deficiency anemia   OSA (obstructive sleep apnea)   Hypertension   Chronic diastolic heart failure (HCC)   CKD (chronic kidney disease) stage 3, GFR 30-59 ml/min (HCC)   Sinus bradycardia   Nausea vomiting -  Resolved, suspect due to dilaudid  Left foot pain likely from gout  -uric acid level elevated -will need chronic medications-- will defer to PCP once flare over -prednisone taper for now-- careful attention to blood sugars -await PT EVal(right leg with achilles tendon tear)  Diabetes mellitus type 2  -lantus -SSI -watch cautiously while on  History of diastolic CHF last EF measured in Mar8 was 60-65% on Demadex.Likely precipitating gout.  Sleep apnea on CPAP.  Hypertension on Cardura.  Chronic kidney disease stage 3-4 - creatinine appears to be at baseline.  Anemia - hemoglobin appears to be at baseline.   DVT prophylaxis:  SCD's  Code Status: Full Code   Family Communication:   Disposition Plan:  Await PT Eval   Consultants:    Subjective: Pain slowly improving but having trouble putting weight on legs  Objective: Vitals:   07/02/17 2117 07/02/17 2225 07/03/17 0634 07/03/17 0926  BP: (!) 164/64  (!) 150/64 (!) 151/63  Pulse: 73 76 76 80  Resp: 18 18 18 18   Temp: 98.6 F (37 C)  98.4 F  (36.9 C) 98.1 F (36.7 C)  TempSrc: Oral  Oral Oral  SpO2: 100% 99% 98% 98%  Weight: 122.5 kg (270 lb)     Height: 5' 1.25" (1.556 m)       Intake/Output Summary (Last 24 hours) at 07/03/17 1306 Last data filed at 07/03/17 1000  Gross per 24 hour  Intake              360 ml  Output                0 ml  Net              360 ml   Filed Weights   07/01/17 2314 07/02/17 2117  Weight: 121 kg (266 lb 12.1 oz) 122.5 kg (270 lb)    Examination:  General exam: in bed, NAD Respiratory system: no wheezing Cardiovascular system: rrr Gastrointestinal system: +BS, soft Central nervous system: Alert and oriented. No focal neurological deficits. Extremities: moves all 4 ext Psychiatry: mood normal     Data Reviewed: I have personally reviewed following labs and imaging studies  CBC:  Recent Labs Lab 07/01/17 1935 07/02/17 0258  WBC 12.8* 11.5*  NEUTROABS 11.1*  --   HGB 11.2* 9.4*  HCT 36.3 30.6*  MCV 80.1 80.5  PLT 190 267   Basic Metabolic Panel:  Recent Labs Lab 07/01/17 1935 07/02/17 0258  NA 137 139  K 4.4 4.4  CL 103 106  CO2 24 24  GLUCOSE 124* 240*  BUN 38* 37*  CREATININE 1.86* 1.79*  CALCIUM 9.1 8.5*   GFR: Estimated Creatinine Clearance: 41.2 mL/min (A) (by C-G formula based on SCr of 1.79 mg/dL (H)). Liver Function Tests:  Recent Labs Lab 07/01/17 2213  AST 17  ALT 12*  ALKPHOS 63  BILITOT 0.5  PROT 8.2*  ALBUMIN 3.2*    Recent Labs Lab 07/01/17 2213  LIPASE 21   No results for input(s): AMMONIA in the last 168 hours. Coagulation Profile: No results for input(s): INR, PROTIME in the last 168 hours. Cardiac Enzymes: No results for input(s): CKTOTAL, CKMB, CKMBINDEX, TROPONINI in the last 168 hours. BNP (last 3 results)  Recent Labs  01/18/17 1209  PROBNP 136   HbA1C: No results for input(s): HGBA1C in the last 72 hours. CBG:  Recent Labs Lab 07/02/17 0743 07/02/17 1237 07/02/17 1706 07/02/17 2110 07/03/17 0801    GLUCAP 137* 176* 339* 419* 187*   Lipid Profile: No results for input(s): CHOL, HDL, LDLCALC, TRIG, CHOLHDL, LDLDIRECT in the last 72 hours. Thyroid Function Tests: No results for input(s): TSH, T4TOTAL, FREET4, T3FREE, THYROIDAB in the last 72 hours. Anemia Panel: No results for input(s): VITAMINB12, FOLATE, FERRITIN, TIBC, IRON, RETICCTPCT in the last 72 hours. Urine analysis:    Component Value Date/Time   COLORURINE YELLOW 11/30/2016 1829   APPEARANCEUR CLEAR 11/30/2016 1829   LABSPEC 1.022 11/30/2016 1829   PHURINE 6.0 11/30/2016 1829   GLUCOSEU >=500 (A) 11/30/2016 1829   HGBUR NEGATIVE 11/30/2016 1829   HGBUR negative 08/20/2009 0807   BILIRUBINUR NEGATIVE 11/30/2016 1829   BILIRUBINUR neg 05/14/2013 1111   KETONESUR NEGATIVE 11/30/2016 1829   PROTEINUR NEGATIVE 11/30/2016 1829   UROBILINOGEN 1.0 11/11/2014 1215   NITRITE NEGATIVE 11/30/2016 1829   LEUKOCYTESUR TRACE (A) 11/30/2016 1829    )No results found for this or any previous visit (from the past 240 hour(s)).    Anti-infectives    None       Radiology Studies: No results found.      Scheduled Meds: . aspirin EC  81 mg Oral Q breakfast  . chlorhexidine  15 mL Mouth Rinse BID  . dorzolamide-timolol  1 drop Both Eyes BID  . doxazosin  4 mg Oral QHS  . insulin aspart  0-9 Units Subcutaneous TID WC  . insulin glargine  50 Units Subcutaneous QAC breakfast  . latanoprost  1 drop Both Eyes QHS  . lisinopril  5 mg Oral Q breakfast  . mouth rinse  15 mL Mouth Rinse q12n4p  . phentermine  37.5 mg Oral Q breakfast  . potassium chloride SA  20 mEq Oral Daily  . predniSONE  40 mg Oral Q breakfast  . torsemide  60 mg Oral QAC breakfast   Continuous Infusions:   LOS: 0 days    Time spent: 25 min    Gervais, DO [facility] Pager [phone]  If 7PM-7AM, please contact night-coverage [email or web address] Password [email or web address] 07/03/2017, 1:06 PM

## 2017-07-03 NOTE — Evaluation (Signed)
Physical Therapy Evaluation Patient Details Name: [name] MRN: [id number] DOB: 02-18-1956 Today's Date: 07/03/2017   History of Present Illness  61 yo female with onset of L foot pain with new gout dx and cleared with xray for fracture was admitted, has been on meds to decrease pain with PT ordered.  PMHx:  CHF, apnea, DM, HTN, CKD,   Clinical Impression  Pt was seen for evaluation of mobility and to control her balance with transfers, but is in too much pain to tolerate a longer walk.  Requires assistance for all standing skills, including transfer to [facility].  Will work toward home but for now requires standing support to transfer safely.  Will need a wc for home if she ends up going directly there.    Follow Up Recommendations SNF    Equipment Recommendations  Wheelchair (measurements PT)    Recommendations for Other Services       Precautions / Restrictions Precautions Precautions: Fall Restrictions Weight Bearing Restrictions: No      Mobility  Bed Mobility Overal bed mobility: Modified Independent             General bed mobility comments: extra time to get legs on and off bed  Transfers Overall transfer level: Needs assistance Equipment used: Rolling walker (2 wheeled);1 person hand held assist Transfers: Sit to/from Stand Sit to Stand: Mod assist         General transfer comment: assisted to power up and uses hands to support the effort  Ambulation/Gait Ambulation/Gait assistance: Min assist Ambulation Distance (Feet): 3 Feet Assistive device: Rolling walker (2 wheeled);1 person hand held assist Gait Pattern/deviations: Step-through pattern;Step-to pattern;Shuffle;Decreased stride length;Wide base of support;Trunk flexed;Antalgic Gait velocity: reduced Gait velocity interpretation: Below normal speed for age/gender General Gait Details: difficulty standing on LLE due to old R achilles injury  Stairs            Wheelchair Mobility     Modified Rankin (Stroke Patients Only)       Balance Overall balance assessment: Needs assistance Sitting-balance support: Feet supported;Bilateral upper extremity supported Sitting balance-Leahy Scale: Good     Standing balance support: Bilateral upper extremity supported;During functional activity Standing balance-Leahy Scale: Poor                               Pertinent Vitals/Pain Pain Assessment: 0-10 Pain Score: 8  Pain Location: standing on L foot, 5 at rest Pain Descriptors / Indicators: Aching;Sharp Pain Intervention(s): Monitored during session;Limited activity within patient's tolerance;Premedicated before session;Repositioned    Home Living Family/patient expects to be discharged to:: Private residence Living Arrangements: Alone Available Help at Discharge: Available PRN/intermittently Type of Home: House Home Access: Stairs to enter Entrance Stairs-Rails: None Entrance Stairs-Number of Steps: 2 Home Layout: One level Home Equipment: Clinical cytogeneticist - 2 wheels;Walker - 4 wheels;Cane - single point Additional Comments: had equipment previously for mobility    Prior Function Level of Independence: Independent with assistive device(s)         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     Hand Dominance        Extremity/Trunk Assessment   Upper Extremity Assessment Upper Extremity Assessment: Overall WFL for tasks assessed    Lower Extremity Assessment Lower Extremity Assessment: Generalized weakness    Cervical / Trunk Assessment Cervical / Trunk Assessment: Normal  Communication   Communication: No difficulties  Cognition Arousal/Alertness: Awake/alert Behavior During Therapy: WFL for tasks assessed/performed  Overall Cognitive Status: Within Functional Limits for tasks assessed                                        General Comments      Exercises     Assessment/Plan    PT Assessment Patient  needs continued PT services  PT Problem List Decreased strength;Decreased range of motion;Decreased activity tolerance;Decreased balance;Decreased mobility;Decreased coordination;Obesity;Pain       PT Treatment Interventions DME instruction;Gait training;Stair training;Functional mobility training;Therapeutic activities;Therapeutic exercise;Balance training;Neuromuscular re-education;Patient/family education    PT Goals (Current goals can be found in the Care Plan section)  Acute Rehab PT Goals Patient Stated Goal: to get her pain managed on L foot PT Goal Formulation: With patient Time For Goal Achievement: 07/17/17 Potential to Achieve Goals: Good    Frequency Min 2X/week   Barriers to discharge Decreased caregiver support;Inaccessible home environment stairs with limited help at home    Co-evaluation               AM-PAC PT "6 Clicks" Daily Activity  Outcome Measure Difficulty turning over in bed (including adjusting bedclothes, sheets and blankets)?: A Little Difficulty moving from lying on back to sitting on the side of the bed? : A Little Difficulty sitting down on and standing up from a chair with arms (e.g., wheelchair, bedside commode, etc,.)?: Unable Help needed moving to and from a bed to chair (including a wheelchair)?: A Little Help needed walking in hospital room?: A Lot Help needed climbing 3-5 steps with a railing? : Total 6 Click Score: 13    End of Session Equipment Utilized During Treatment: Gait belt Activity Tolerance: Patient limited by pain;Patient limited by fatigue Patient left: in bed;with call bell/phone within reach (planning to get a bath) Nurse Communication: Mobility status PT Visit Diagnosis: Unsteadiness on feet (R26.81);Muscle weakness (generalized) (M62.81);Difficulty in walking, not elsewhere classified (R26.2);Pain Pain - Right/Left: Left Pain - part of body: Ankle and joints of foot    Time: 2440-1027 PT Time Calculation (min)  (ACUTE ONLY): 36 min   Charges:   PT Evaluation $PT Eval Moderate Complexity: 1 Mod     PT G Codes:   PT G-Codes **NOT FOR INPATIENT CLASS** Functional Assessment Tool Used: AM-PAC 6 Clicks Basic Mobility Functional Limitation: Mobility: Walking and moving around Mobility: Walking and Moving Around Current Status (O5366): At least 40 percent but less than 60 percent impaired, limited or restricted Mobility: Walking and Moving Around Goal Status [phone]): At least 1 percent but less than 20 percent impaired, limited or restricted    Ramond Dial 07/03/2017, 4:09 PM   Mee Hives, PT MS Acute Rehab Dept. Number: [place] and [place]

## 2017-07-03 NOTE — Progress Notes (Signed)
Inpatient Diabetes Program Recommendations  AACE/ADA: New Consensus Statement on Inpatient Glycemic Control (2015)  Target Ranges:  Prepandial:   less than 140 mg/dL      Peak postprandial:   less than 180 mg/dL (1-2 hours)      Critically ill patients:  140 - 180 mg/dL   Lab Results  Component Value Date   GLUCAP 187 (H) 07/03/2017   HGBA1C 9.9 (H) 12/02/2016    Review of Glycemic Control Results for [name], [name] (MRN [id number]) as of 07/03/2017 11:08  Ref. Range 07/02/2017 07:43 07/02/2017 12:37 07/02/2017 17:06 07/02/2017 21:10 07/03/2017 08:01  Glucose-Capillary Latest Ref Range: 65 - 99 mg/dL 137 (H) 176 (H) 339 (H) 419 (H) 187 (H)   Diabetes history: DM2 Outpatient Diabetes medications: Lantus 50 units qd + Novolog 3-20 units tid  Current orders for Inpatient glycemic control: Lantus 50 units qd + Novolog correction sensitive tid  Inpatient Diabetes Program Recommendations:    Please consider: -Novolog 4 units tid meal coverage if eats 50%  Thank you, Bethena Roys E. Calie Buttrey, RN, MSN, CDE  Diabetes Coordinator Inpatient Glycemic Control Team Team Pager [phone] (8am-5pm) 07/03/2017 11:13 AM

## 2017-07-03 NOTE — Progress Notes (Signed)
FSBS of 446. Stat lab ordered. Called and notified Dr. Eliseo Squires. She said to give 12 units of insulin now.

## 2017-07-04 DIAGNOSIS — M10372 Gout due to renal impairment, left ankle and foot: Secondary | ICD-10-CM | POA: Diagnosis not present

## 2017-07-04 DIAGNOSIS — E1165 Type 2 diabetes mellitus with hyperglycemia: Secondary | ICD-10-CM | POA: Diagnosis not present

## 2017-07-04 DIAGNOSIS — R112 Nausea with vomiting, unspecified: Secondary | ICD-10-CM | POA: Diagnosis not present

## 2017-07-04 DIAGNOSIS — N183 Chronic kidney disease, stage 3 (moderate): Secondary | ICD-10-CM | POA: Diagnosis not present

## 2017-07-04 LAB — BASIC METABOLIC PANEL
Anion gap: 10 (ref 5–15)
BUN: 38 mg/dL — ABNORMAL HIGH (ref 6–20)
CHLORIDE: 106 mmol/L (ref 101–111)
CO2: 23 mmol/L (ref 22–32)
CREATININE: 1.65 mg/dL — AB (ref 0.44–1.00)
Calcium: 8.7 mg/dL — ABNORMAL LOW (ref 8.9–10.3)
GFR calc non Af Amer: 33 mL/min — ABNORMAL LOW (ref >60)
GFR, EST AFRICAN AMERICAN: 38 mL/min — AB (ref >60)
GLUCOSE: 227 mg/dL — AB (ref 65–99)
Potassium: 3.8 mmol/L (ref 3.5–5.1)
Sodium: 139 mmol/L (ref 135–145)

## 2017-07-04 LAB — URIC ACID: Uric Acid, Serum: 14.2 mg/dL — ABNORMAL HIGH (ref 2.3–6.6)

## 2017-07-04 LAB — GLUCOSE, CAPILLARY
GLUCOSE-CAPILLARY: 254 mg/dL — AB (ref 65–99)
GLUCOSE-CAPILLARY: 197 mg/dL — AB (ref 65–99)
Glucose-Capillary: 138 mg/dL — ABNORMAL HIGH (ref 65–99)

## 2017-07-04 MED ORDER — PREDNISONE 20 MG PO TABS: 30.0000 mg | ORAL_TABLET | Freq: Every day | ORAL | Status: DC

## 2017-07-04 MED ORDER — INSULIN ASPART 100 UNIT/ML ~~LOC~~ SOLN
10.0000 [IU] | Freq: Three times a day (TID) | SUBCUTANEOUS | Status: DC
  Administered 2017-07-04 (×2): 10 [IU] via SUBCUTANEOUS

## 2017-07-04 MED ORDER — PREDNISONE 10 MG PO TABS: ORAL_TABLET | ORAL | 0 refills | Status: DC

## 2017-07-04 MED ORDER — HYDROCODONE-ACETAMINOPHEN 5-325 MG PO TABS: 1.0000 | ORAL_TABLET | ORAL | 0 refills | Status: DC | PRN

## 2017-07-04 NOTE — Progress Notes (Signed)
Physical Therapy Treatment Patient Details Name: Manessa Buley MRN: [id number] DOB: 04/09/1956 Today's Date: 07/04/2017    History of Present Illness 61 yo female with onset of L foot pain with new gout dx and cleared with xray for fracture was admitted, has been on meds to decrease pain with PT ordered.  PMHx:  CHF, apnea, DM, HTN, CKD,     PT Comments    Pt was able to stand with min guard today, minor guarding to walk a distance but after is still noting pain in L foot. Pain is manageable and would decrease with her use of WC to protect her L foot.  Talked with SW Lorriane Shire to promote safety at home, but PT will likely not be covered by her insurance.  Follow acutely until DC.  Follow Up Recommendations  Home health PT;Other (comment) (assistance for self care at home)     Equipment Recommendations  Wheelchair (measurements PT);Wheelchair cushion (measurements PT);Other (comment) (20" wc and cushion, and asking for shower chair or bench)    Recommendations for Other Services       Precautions / Restrictions Precautions Precautions: Fall Restrictions Weight Bearing Restrictions: No    Mobility  Bed Mobility Overal bed mobility: Modified Independent                Transfers Overall transfer level: Needs assistance Equipment used: Rolling walker (2 wheeled) Transfers: Sit to/from Stand Sit to Stand: Supervision         General transfer comment: supervised for safety given assistance needed yesterday  Ambulation/Gait Ambulation/Gait assistance: Min guard Ambulation Distance (Feet): 8 Feet Assistive device: Rolling walker (2 wheeled);1 person hand held assist Gait Pattern/deviations: Step-through pattern;Wide base of support;Decreased weight shift to left;Decreased stride length;Antalgic Gait velocity: reduced Gait velocity interpretation: Below normal speed for age/gender General Gait Details: painful but better to use LLE and easing RLE achilles   Stairs            Wheelchair Mobility    Modified Rankin (Stroke Patients Only)       Balance Overall balance assessment: Needs assistance Sitting-balance support: Feet supported Sitting balance-Leahy Scale: Good     Standing balance support: Bilateral upper extremity supported;During functional activity Standing balance-Leahy Scale: Fair Standing balance comment: fair without walker and needs RW to move                            Cognition Arousal/Alertness: Awake/alert Behavior During Therapy: WFL for tasks assessed/performed Overall Cognitive Status: Within Functional Limits for tasks assessed                                        Exercises      General Comments General comments (skin integrity, edema, etc.): Has decreased tolerance for LLE use but is better today than yesterday      Pertinent Vitals/Pain Pain Assessment: 0-10 Pain Score: 4  Pain Location: standing and walking on LLE Pain Descriptors / Indicators: Sore Pain Intervention(s): Monitored during session;Premedicated before session;Repositioned;Limited activity within patient's tolerance    Home Living                      Prior Function            PT Goals (current goals can now be found in the care plan section) Acute Rehab PT Goals Patient Stated  Goal: to get her pain managed on L foot PT Goal Formulation: With patient Progress towards PT goals: Progressing toward goals    Frequency    Min 3X/week      PT Plan Discharge plan needs to be updated    Co-evaluation              AM-PAC PT "6 Clicks" Daily Activity  Outcome Measure  Difficulty turning over in bed (including adjusting bedclothes, sheets and blankets)?: A Little Difficulty moving from lying on back to sitting on the side of the bed? : A Little Difficulty sitting down on and standing up from a chair with arms (e.g., wheelchair, bedside commode, etc,.)?: A Lot Help needed moving to and  from a bed to chair (including a wheelchair)?: A Little Help needed walking in hospital room?: A Little Help needed climbing 3-5 steps with a railing? : A Lot 6 Click Score: 16    End of Session Equipment Utilized During Treatment: Gait belt Activity Tolerance: Patient limited by pain Patient left: in chair;with call bell/phone within reach Nurse Communication: Mobility status PT Visit Diagnosis: Unsteadiness on feet (R26.81);Muscle weakness (generalized) (M62.81);Difficulty in walking, not elsewhere classified (R26.2);Pain Pain - Right/Left: Left Pain - part of body: Ankle and joints of foot     Time: 1331-1402 PT Time Calculation (min) (ACUTE ONLY): 31 min  Charges:  $Gait Training: 8-22 mins $Therapeutic Activity: 8-22 mins                    G Codes:  Functional Assessment Tool Used: AM-PAC 6 Clicks Basic Mobility Functional Limitation: Mobility: Walking and moving around Mobility: Walking and Moving Around Current Status (O4599): At least 20 percent but less than 40 percent impaired, limited or restricted Mobility: Walking and Moving Around Goal Status [phone]): At least 1 percent but less than 20 percent impaired, limited or restricted     Ramond Dial 07/04/2017, 2:34 PM   Mee Hives, PT MS Acute Rehab Dept. Number: [place] [facility] [place]

## 2017-07-04 NOTE — Progress Notes (Signed)
Pt discharging home with self care. Pt with orders for wheelchair and tub bench. Brad with [facility] DME notified and will deliver the equipment to the room. Pt does not have a qualifying diagnosis for [facility] therapies. She inquired about home aide services. CM encouraged her to contact her Medicaid CM to see if she will qualify for aide services.  Patient states she has transportation home.

## 2017-07-04 NOTE — Progress Notes (Signed)
Patient was sitting in a chair by her bed-side. No family present but patient talked of good support from her family(daughter). Patient very apprciative and receptive to chaplain's visitation. Chaplain provided reflective listening and compassionate presence. Patient wanted another visit later. Chaplain promised referral to next chaplain on-call.  Chaplain Victorious Kundinger.

## 2017-07-04 NOTE — Discharge Summary (Signed)
Physician Discharge Summary  [name] [id number]:[id number] DOB: August 12, 1956 DOA: 07/01/2017  PCP: Javier Docker, MD  Admit date: 07/01/2017 Discharge date: 07/04/2017   Recommendations for Outpatient Follow-Up:   1. DME equipment given 2. Needs preventative medication for gout once flare over 3. Close monitoring of blood sugars-- elevated while on steroids   Discharge Diagnosis:   Principal Problem:   Nausea & vomiting Active Problems:   Diabetes mellitus (HCC)   Iron deficiency anemia   OSA (obstructive sleep apnea)   Hypertension   Chronic diastolic heart failure (HCC)   CKD (chronic kidney disease) stage 3, GFR 30-59 ml/min (HCC)   Sinus bradycardia   Discharge disposition:  Home.  Discharge Condition: Improved.  Diet recommendation: Low sodium, heart healthy.  Carbohydrate-modified.  Wound care: None.   History of Present Illness:   [name] is a 61 y.o. female with history of CHF, sleep apnea, diabetes mellitus, hypertension and chronic kidney disease presents to the ER because of worsening left foot pain for last 2 days. Patient states she is unable to put pressure on the feet because of the pain. 2 months ago she was diagnosed with a right foot gout and was on steroid taper. Denies any trauma fever or chills.  ED Course: in the ER patient was given prednisone and Dilaudid  Following which patient was unable to keep anything and had multiple episodes of nausea vomiting. Patient was feeling weak and was admitted for further observation. Abdomen appears benign on exam. Patient already has a diagnosis of right foot Achilles tendon rupture.   Hospital Course by Problem:   Nausea vomiting -  Resolved, suspect due to dilaudid  Left foot pain likely from gout  -uric acid level elevated -will need chronic medications-- will defer to PCP once flare over -prednisone taper for now-- careful attention to blood sugars  Diabetes mellitus type 2   -lantus -SSI -watch cautiously while on steroids  History of diastolic CHF last EF measured in Mar8 was 60-65% on Demadex.Likely precipitating gout.  Sleep apnea on CPAP.  Hypertension on Cardura.  Chronic kidney disease stage 3-4 - creatinine appears to be at baseline.  Anemia - hemoglobin appears to be at baseline    Medical Consultants:    None.   Discharge Exam:   Vitals:   07/04/17 0510 07/04/17 1000  BP: (!) 155/56 (!) 147/56  Pulse: (!) 55 (!) 54  Resp: 18 20  Temp: 98.4 F (36.9 C) 98.7 F (37.1 C)  SpO2: 100% 100%   Vitals:   07/03/17 1809 07/03/17 2227 07/04/17 0510 07/04/17 1000  BP: (!) 145/58 (!) 164/60 (!) 155/56 (!) 147/56  Pulse: 81 (!) 58 (!) 55 (!) 54  Resp: 18 18 18 20   Temp: 98.2 F (36.8 C) 98.6 F (37 C) 98.4 F (36.9 C) 98.7 F (37.1 C)  TempSrc: Oral Oral Oral Oral  SpO2: 99% 100% 100% 100%  Weight:  122 kg (269 lb)    Height:        Gen:  NAD- able to bear weight more on leg    The results of significant diagnostics from this hospitalization (including imaging, microbiology, ancillary and laboratory) are listed below for reference.     Procedures and Diagnostic Studies:   Dg Foot Complete Left  Result Date: 07/01/2017 CLINICAL DATA:  Left foot pain x1 day EXAM: LEFT FOOT - COMPLETE 3+ VIEW COMPARISON:  04/06/2014 FINDINGS: No fracture or dislocation is seen. Degenerative changes the dorsal midfoot. Small plantar and  posterior calcaneal enthesophyte. Vascular calcifications. Mild soft tissue swelling/left lower extremity edema. IMPRESSION: No acute osseus abnormality is seen. Electronically Signed   By: Julian Hy M.D.   On: 07/01/2017 13:15     Labs:   Basic Metabolic Panel:  Recent Labs Lab 07/01/17 1935 07/02/17 0258 07/03/17 1800 07/04/17 0926  NA 137 139  --  139  K 4.4 4.4  --  3.8  CL 103 106  --  106  CO2 24 24  --  23  GLUCOSE 124* 240* 422* 227*  BUN 38* 37*  --  38*  CREATININE 1.86*  1.79*  --  1.65*  CALCIUM 9.1 8.5*  --  8.7*   GFR Estimated Creatinine Clearance: 44.5 mL/min (A) (by C-G formula based on SCr of 1.65 mg/dL (H)). Liver Function Tests:  Recent Labs Lab 07/01/17 2213  AST 17  ALT 12*  ALKPHOS 63  BILITOT 0.5  PROT 8.2*  ALBUMIN 3.2*    Recent Labs Lab 07/01/17 2213  LIPASE 21   No results for input(s): AMMONIA in the last 168 hours. Coagulation profile No results for input(s): INR, PROTIME in the last 168 hours.  CBC:  Recent Labs Lab 07/01/17 1935 07/02/17 0258  WBC 12.8* 11.5*  NEUTROABS 11.1*  --   HGB 11.2* 9.4*  HCT 36.3 30.6*  MCV 80.1 80.5  PLT 190 192   Cardiac Enzymes: No results for input(s): CKTOTAL, CKMB, CKMBINDEX, TROPONINI in the last 168 hours. BNP: Invalid input(s): POCBNP CBG:  Recent Labs Lab 07/03/17 1305 07/03/17 1711 07/03/17 2221 07/04/17 0737 07/04/17 1124  GLUCAP 277* 446* 385* 254* 138*   D-Dimer No results for input(s): DDIMER in the last 72 hours. Hgb A1c No results for input(s): HGBA1C in the last 72 hours. Lipid Profile No results for input(s): CHOL, HDL, LDLCALC, TRIG, CHOLHDL, LDLDIRECT in the last 72 hours. Thyroid function studies No results for input(s): TSH, T4TOTAL, T3FREE, THYROIDAB in the last 72 hours.  Invalid input(s): FREET3 Anemia work up No results for input(s): VITAMINB12, FOLATE, FERRITIN, TIBC, IRON, RETICCTPCT in the last 72 hours. Microbiology No results found for this or any previous visit (from the past 240 hour(s)).   Discharge Instructions:   Discharge Instructions    Diet - low sodium heart healthy    Complete by:  As directed    Diet Carb Modified    Complete by:  As directed    Discharge instructions    Complete by:  As directed    Monitor blood sugars closely while on steroids-- will need more insulin Will need preventative medication started once acute episode over   Increase activity slowly    Complete by:  As directed      Allergies as  of 07/04/2017      Reactions   Pork-derived Products Nausea And Vomiting      Medication List    STOP taking these medications   acetaminophen-codeine 300-30 MG tablet Commonly known as:  TYLENOL #3     TAKE these medications   acetaminophen 500 MG tablet Commonly known as:  TYLENOL Take 500-1,000 mg by mouth every 6 (six) hours as needed for mild pain or headache.   albuterol 108 (90 Base) MCG/ACT inhaler Commonly known as:  PROVENTIL HFA;VENTOLIN HFA Inhale 2 puffs into the lungs every 6 (six) hours as needed for wheezing or shortness of breath.   aspirin EC 81 MG tablet Take 81 mg by mouth daily with breakfast.   dorzolamide-timolol 22.3-6.8 MG/ML ophthalmic solution  Commonly known as:  COSOPT Place 1 drop into both eyes 2 (two) times daily.   doxazosin 4 MG tablet Commonly known as:  CARDURA Take 4 mg by mouth at bedtime.   HYDROcodone-acetaminophen 5-325 MG tablet Commonly known as:  NORCO/VICODIN Take 1 tablet by mouth every 4 (four) hours as needed for moderate pain.   insulin glargine 100 unit/mL Sopn Commonly known as:  LANTUS Inject 50 Units into the skin daily before breakfast.   lisinopril 5 MG tablet Commonly known as:  PRINIVIL,ZESTRIL Take 5 mg by mouth daily with breakfast.   NOVOLOG FLEXPEN 100 UNIT/ML FlexPen Generic drug:  insulin aspart Inject 3-20 Units into the skin 3 (three) times daily as needed for high blood sugar (CBG 150).   phentermine 37.5 MG tablet Commonly known as:  ADIPEX-P Take 37.5 mg by mouth daily with breakfast.   potassium chloride SA 20 MEQ tablet Commonly known as:  K-DUR,KLOR-CON Take 1 tablet (20 mEq total) by mouth daily.   predniSONE 10 MG tablet Commonly known as:  DELTASONE Prednisone 30 mg x 2 days then 20 mg x 2 days then 10 mg x 2 days then 5 mg x 2 days then stop   PRESCRIPTION MEDICATION Inhale into the lungs at bedtime. CPAP   tiZANidine 4 MG tablet Commonly known as:  ZANAFLEX Take 4 mg by mouth  every 12 (twelve) hours as needed for muscle spasms. Not to exceed 3 doses in 24 hours   torsemide 20 MG tablet Commonly known as:  DEMADEX Take 3 tablets (60 mg total) by mouth 2 (two) times daily. What changed:  when to take this   Travoprost (BAK Free) 0.004 % Soln ophthalmic solution Commonly known as:  TRAVATAN Place 1 drop into both eyes at bedtime.            Durable Medical Equipment        Start     Ordered   07/04/17 1443  For home use only DME Tub bench  Once     07/04/17 1442   07/04/17 1438  For home use only DME lightweight manual wheelchair with seat cushion  Once    Comments:  Patient suffers from achilles tendon tear and gout which impairs their ability to perform daily activities like toileting in the home.  A crutch will not resolve  issue with performing activities of daily living. A wheelchair will allow patient to safely perform daily activities. Patient is not able to propel themselves in the home using a standard weight wheelchair due to endurance. Patient can self propel in the lightweight wheelchair.  Accessories: elevating leg rests (ELRs), wheel locks, extensions and anti-tippers.   07/04/17 1437     Follow-up Information    Pavelock, Ralene Bathe, MD Follow up in 1 week(s).   Specialty:  Internal Medicine Why:  once gout episode over may need ortho referral for injection over systemic steroids vs mediation prevent attacks Contact information: [address] [place] [address] [phone]            Time coordinating discharge: 35 min  Signed:  Auriella Wieand Alison Pierce   [facility] 07/04/2017, 3:11 PM

## 2017-07-04 NOTE — Progress Notes (Signed)
    Durable Medical Equipment        Start     Ordered   07/04/17 1443  For home use only DME Tub bench  Once     07/04/17 1442   07/04/17 1438  For home use only DME lightweight manual wheelchair with seat cushion  Once    Comments:  Patient suffers from achilles tendon tear and gout which impairs their ability to perform daily activities like toileting in the home.  A crutch will not resolve  issue with performing activities of daily living. A wheelchair will allow patient to safely perform daily activities. Patient is not able to propel themselves in the home using a standard weight wheelchair due to endurance. Patient can self propel in the lightweight wheelchair.  Accessories: elevating leg rests (ELRs), wheel locks, extensions and anti-tippers.   07/04/17 1437

## 2017-07-04 NOTE — Progress Notes (Signed)
Patient discharged to home with family. IV removed. Telemetry removed. All belongings with patient. Discharge instructions reviewed with patient . Scripts given. Patient left unit in stable condition.   Sheliah Plane RN

## 2017-07-04 NOTE — Progress Notes (Signed)
RT set up patient CPAP. Patient is able to place herself on and off CPAP. NO assistance needed. No O2 bleed in needed

## 2017-07-05 ENCOUNTER — Telehealth (HOSPITAL_COMMUNITY): Payer: Self-pay

## 2017-07-05 NOTE — Telephone Encounter (Signed)
CHF Clinic appointment reminder call placed to patient for upcoming post-hospital follow up.  Does understand purpose of this appointment and where CHF Clinic is located? Yes  How is patient feeling? States was recently in ED for gout flare up, but otherwise well.  Does patient have all of their medications since their recent discharge? Yes  Patient also reminded to take all medications as prescribed on the day of his/her appointment and to bring all medications to this appointment.  Advised to call our office for tardiness or cancellations/rescheduling needs.  Leory Plowman, Guinevere Ferrari

## 2017-07-06 ENCOUNTER — Ambulatory Visit (HOSPITAL_COMMUNITY)
Admission: RE | Admit: 2017-07-06 | Discharge: 2017-07-06 | Disposition: A | Discharge status: Home / Self Care | Payer: Medicaid Other | Source: Ambulatory Visit | Attending: Internal Medicine | Admitting: Internal Medicine

## 2017-07-06 ENCOUNTER — Encounter (HOSPITAL_COMMUNITY): Payer: Self-pay

## 2017-07-06 VITALS — BP 124/48 | HR 57 | Wt 266.0 lb

## 2017-07-06 DIAGNOSIS — E1122 Type 2 diabetes mellitus with diabetic chronic kidney disease: Secondary | ICD-10-CM | POA: Diagnosis not present

## 2017-07-06 DIAGNOSIS — I130 Hypertensive heart and chronic kidney disease with heart failure and stage 1 through stage 4 chronic kidney disease, or unspecified chronic kidney disease: Secondary | ICD-10-CM | POA: Diagnosis present

## 2017-07-06 DIAGNOSIS — I10 Essential (primary) hypertension: Secondary | ICD-10-CM

## 2017-07-06 DIAGNOSIS — I5032 Chronic diastolic (congestive) heart failure: Secondary | ICD-10-CM | POA: Insufficient documentation

## 2017-07-06 DIAGNOSIS — Z794 Long term (current) use of insulin: Secondary | ICD-10-CM | POA: Insufficient documentation

## 2017-07-06 DIAGNOSIS — E1140 Type 2 diabetes mellitus with diabetic neuropathy, unspecified: Secondary | ICD-10-CM

## 2017-07-06 DIAGNOSIS — Z87891 Personal history of nicotine dependence: Secondary | ICD-10-CM | POA: Diagnosis not present

## 2017-07-06 DIAGNOSIS — R001 Bradycardia, unspecified: Secondary | ICD-10-CM | POA: Insufficient documentation

## 2017-07-06 DIAGNOSIS — N183 Chronic kidney disease, stage 3 unspecified: Secondary | ICD-10-CM

## 2017-07-06 DIAGNOSIS — G4733 Obstructive sleep apnea (adult) (pediatric): Secondary | ICD-10-CM | POA: Insufficient documentation

## 2017-07-06 DIAGNOSIS — Z6841 Body Mass Index (BMI) 40.0 and over, adult: Secondary | ICD-10-CM | POA: Insufficient documentation

## 2017-07-06 DIAGNOSIS — Z7982 Long term (current) use of aspirin: Secondary | ICD-10-CM | POA: Insufficient documentation

## 2017-07-06 DIAGNOSIS — J45909 Unspecified asthma, uncomplicated: Secondary | ICD-10-CM | POA: Insufficient documentation

## 2017-07-06 DIAGNOSIS — G8929 Other chronic pain: Secondary | ICD-10-CM | POA: Diagnosis not present

## 2017-07-06 LAB — BASIC METABOLIC PANEL
Anion gap: 8 (ref 5–15)
BUN: 43 mg/dL — ABNORMAL HIGH (ref 6–20)
CALCIUM: 9 mg/dL (ref 8.9–10.3)
CHLORIDE: 105 mmol/L (ref 101–111)
CO2: 26 mmol/L (ref 22–32)
Creatinine, Ser: 1.65 mg/dL — ABNORMAL HIGH (ref 0.44–1.00)
GFR calc non Af Amer: 33 mL/min — ABNORMAL LOW (ref >60)
GFR, EST AFRICAN AMERICAN: 38 mL/min — AB (ref >60)
Glucose, Bld: 161 mg/dL — ABNORMAL HIGH (ref 65–99)
POTASSIUM: 4.1 mmol/L (ref 3.5–5.1)
SODIUM: 139 mmol/L (ref 135–145)

## 2017-07-06 NOTE — Progress Notes (Signed)
Advanced Heart Failure Clinic Note   Referring Physician: Dr. Johnsie Cancel Primary Care: [place], [place]  Primary Cardiologist: Dr. Johnsie Cancel HF: Dr. Aundra Dubin  HPI: [name] is a 61 y.o. female with PMH Chronic diastolic CHF, OSA, Morbid obesity, DM2, HTN, CKD, and sinus bradycardia.   Admitted 11/2016 with CHF. Echo and RHC as below. Normal LVEF with grade 2 DD. RHC with normal pressures s/p diuresis. Discharge weight 246 lbs.   Seen by Dr. Johnsie Cancel 01/18/2017. Switched from lasix 40 mg daily to torsemide 20 mg BID with bloating and weight gain. Pt weight at that visit 252 lbs ( 6 lbs over 2 months)  Social: On disability. Had back surgery 08/2014 and has ongoing chronic back pain. Lives at home with roommate. Daughter lives in [place].   Returns today for HF follow up. Weight down 5 pounds from last visit after she was instructed to take metolazone and torsemide was increased for a few days. SOB with walking around the house, but says this is due to her back. Recently hospitalized for gout. Does not get very much activity at all. She has not taken her torsemide yet today, but has taken other medications. Paramedicine continues to follow, reports intermittent compliance with medications and diet.   Echo 12/02/16 LVEF 60-65%, Grade 2 DD, Trivial TR, Trivial PI. Normal RV.   [place] 12/06/16: Hemodynamics (mmHg) RA mean 6 RV 48/7 PA 45/14 (mean 23) PCWP 8 PVR 2.7 WU AO 99% Cardiac Output (Fick) 5.6 Cardiac Index (Fick) 2.72  Past Medical History:  Diagnosis Date  . Achilles rupture   . Anxiety   . Arthritis   . Asthma   . Chronic back pain   . Chronic headaches   . Chronic leg pain    bilaterally  . Depression   . Diabetes mellitus 1999   T2DM  . Glaucoma   . H/O gestational diabetes mellitus, not currently pregnant   . Hyperlipidemia   . Hypertension   . Morbid obesity ([place])   . OSA (obstructive sleep apnea)   . Sciatica     Current Outpatient Prescriptions  Medication Sig  Dispense Refill  . acetaminophen (TYLENOL) 500 MG tablet Take 500-1,000 mg by mouth every 6 (six) hours as needed for mild pain or headache.     . albuterol (PROVENTIL HFA;VENTOLIN HFA) 108 (90 BASE) MCG/ACT inhaler Inhale 2 puffs into the lungs every 6 (six) hours as needed for wheezing or shortness of breath.     Marland Kitchen aspirin EC 81 MG tablet Take 81 mg by mouth daily with breakfast.    . dorzolamide-timolol (COSOPT) 22.3-6.8 MG/ML ophthalmic solution Place 1 drop into both eyes 2 (two) times daily.    Marland Kitchen doxazosin (CARDURA) 4 MG tablet Take 4 mg by mouth at bedtime.    Marland Kitchen HYDROcodone-acetaminophen (NORCO/VICODIN) 5-325 MG tablet Take 1 tablet by mouth every 4 (four) hours as needed for moderate pain. 15 tablet 0  . insulin aspart (NOVOLOG FLEXPEN) 100 UNIT/ML FlexPen Inject 3-20 Units into the skin 3 (three) times daily as needed for high blood sugar (CBG 150).    . insulin glargine (LANTUS) 100 unit/mL SOPN Inject 50 Units into the skin daily before breakfast.    . lisinopril (PRINIVIL,ZESTRIL) 5 MG tablet Take 5 mg by mouth daily with breakfast.   1  . phentermine (ADIPEX-P) 37.5 MG tablet Take 37.5 mg by mouth daily with breakfast.   0  . potassium chloride SA (K-DUR,KLOR-CON) 20 MEQ tablet Take 1 tablet (20 mEq total) by mouth  daily. 30 tablet 6  . predniSONE (DELTASONE) 10 MG tablet Prednisone 30 mg x 2 days then 20 mg x 2 days then 10 mg x 2 days then 5 mg x 2 days then stop 13 tablet 0  . PRESCRIPTION MEDICATION Inhale into the lungs at bedtime. CPAP    . tiZANidine (ZANAFLEX) 4 MG tablet Take 4 mg by mouth every 12 (twelve) hours as needed for muscle spasms. Not to exceed 3 doses in 24 hours  0  . torsemide (DEMADEX) 20 MG tablet Take 60 mg by mouth daily.    . Travoprost, BAK Free, (TRAVATAN) 0.004 % SOLN ophthalmic solution Place 1 drop into both eyes at bedtime.     No current facility-administered medications for this encounter.     Allergies  Allergen Reactions  . Pork-Derived  Products Nausea And Vomiting     Social History   Social History  . Marital status: Single    Spouse name: N/A  . Number of children: N/A  . Years of education: N/A   Occupational History  . Not on file.   Social History Main Topics  . Smoking status: Former Smoker    Types: Cigarettes    Quit date: 09/04/1998  . Smokeless tobacco: Never Used  . Alcohol use Yes     Comment: occasional  . Drug use: No  . Sexual activity: Yes    Birth control/ protection: None   Other Topics Concern  . Not on file   Social History Narrative  . No narrative on file      Family History  Problem Relation Age of Onset  . Heart failure Mother   . Renal Disease Mother     Vitals:   07/06/17 1006  BP: (!) 124/48  Pulse: (!) 57  SpO2: 100%  Weight: 266 lb (120.7 kg)   Wt Readings from Last 3 Encounters:  07/06/17 266 lb (120.7 kg)  07/03/17 269 lb (122 kg)  06/26/17 272 lb (123.4 kg)    PHYSICAL EXAM: General: Well appearing, arrived in wheelchair. No resp difficulty. HEENT: Normal Neck: Supple. JVP 8-9 cm. Carotids 2+ bilat; no bruits. No thyromegaly or nodule noted. Cor: PMI nondisplaced. RRR, No M/G/R noted Lungs: CTAB, normal effort. Abdomen: Obese, soft, non-tender, non-distended, no HSM. No bruits or masses. +BS  Extremities: No cyanosis, clubbing, or rash.1+ edema to knees.  Neuro: Alert & orientedx3, cranial nerves grossly intact. moves all 4 extremities w/o difficulty. Affect pleasant     ASSESSMENT & PLAN:  1. Chronic diastolic CHF: Echo 09/6107 EF 60-65%, Grade 2 DD, trivial TR, normal RV.  - NYHA III, hard to assess due to limited mobility from back pain.  - Volume elevated on exam, she has not taken her torsemide today.  - Continue torsemide 60 mg daily.  - We have tried adding Arlyce Harman and creatinine went from 1.6 to 2.18. So Arlyce Harman stopped.  - I have encouraged her to limit her fluid intake and sodium intake.   2. CKD Stage III: baseline creatinine 1.6-1.8 -  BMET today.   3. Morbid obesity - Body mass index is 49.85 kg/m. - Encouraged her to limit portions and increase exercise.   4. HTN - Well controlled on current regimen.   5. Sinus bradycardia - Stable, not on beta blocker.   6. OSA - Non compliant with CPAP.   7. DM2 - Per PCP   Follow up in 3 months. She continues to be at high risk for readmission due to intermittent  compliance with meds despite paramedicine. BMET today.    Arbutus Leas, NP 07/06/17

## 2017-07-06 NOTE — Patient Instructions (Signed)
Routine lab work today. Will notify you of abnormal results, otherwise no news is good news!  No changes to medication at this time.  Follow up 3 months. We will call you closer to this time, or you may call our office to schedule 1 month before you are due to be seen. Take all medication as prescribed the day of your appointment. Bring all medications with you to your appointment.  Do the following things EVERYDAY: 1) Weigh yourself in the morning before breakfast. Write it down and keep it in a log. 2) Take your medicines as prescribed 3) Eat low salt foods-Limit salt (sodium) to 2000 mg per day.  4) Stay as active as you can everyday 5) Limit all fluids for the day to less than 2 liters

## 2017-07-11 ENCOUNTER — Other Ambulatory Visit (HOSPITAL_COMMUNITY): Payer: Self-pay

## 2017-07-11 NOTE — Progress Notes (Signed)
Paramedicine Encounter    Patient ID: [name], female    DOB: 1956-04-25, 61 y.o.   MRN: [id number]    Patient Care Team: Javier Docker, MD as PCP - General (Internal Medicine)  Patient Active Problem List   Diagnosis Date Noted  . Nausea & vomiting 07/01/2017  . Acute gout due to renal impairment involving left ankle   . Hypertensive heart and renal disease   . SOB (shortness of breath)   . Chronic diastolic heart failure ([place]) 11/30/2016  . CKD (chronic kidney disease) stage 3, GFR 30-59 ml/min (HCC) 11/30/2016  . Thrombocytopenia ([place]) 11/30/2016  . Sinus bradycardia 11/30/2016  . PMB (postmenopausal bleeding) 05/09/2010  . ANEMIA 05/05/2010  . DEPRESSION 04/26/2010  . TMJ SYNDROME 04/26/2010  . DEGENERATIVE DISC DISEASE, CERVICAL SPINE 04/26/2010  . CHEST PAIN 04/26/2010  . CHRONIC OBSTRUCTIVE PULMONARY DISEASE, MODERATE 03/26/2010  . MASTALGIA 10/08/2009  . Iron deficiency anemia 07/12/2009  . Diabetes mellitus ([place]) 07/02/2009  . Hyperlipidemia 07/02/2009  . BMI 45.0-49.9, adult ([place]) 07/02/2009  . OSA (obstructive sleep apnea) 07/02/2009  . Glaucoma 07/02/2009  . Hypertension 07/02/2009  . ASTHMA 07/02/2009  . LOW BACK PAIN SYNDROME 07/02/2009  . BACK PAIN 07/02/2009  . PERIPHERAL EDEMA 07/02/2009  . GROIN PAIN 07/02/2009  . TOBACCO USE, QUIT 07/02/2009  . HEMORRHOIDS, INTERNAL 07/16/2008  . DIVERTICULOSIS OF COLON 07/16/2008    Current Outpatient Prescriptions:  .  acetaminophen (TYLENOL) 500 MG tablet, Take 500-1,000 mg by mouth every 6 (six) hours as needed for mild pain or headache. , Disp: , Rfl:  .  albuterol (PROVENTIL HFA;VENTOLIN HFA) 108 (90 BASE) MCG/ACT inhaler, Inhale 2 puffs into the lungs every 6 (six) hours as needed for wheezing or shortness of breath. , Disp: , Rfl:  .  aspirin EC 81 MG tablet, Take 81 mg by mouth daily with breakfast., Disp: , Rfl:  .  dorzolamide-timolol (COSOPT) 22.3-6.8 MG/ML ophthalmic solution, Place 1 drop into  both eyes 2 (two) times daily., Disp: , Rfl:  .  doxazosin (CARDURA) 4 MG tablet, Take 4 mg by mouth at bedtime., Disp: , Rfl:  .  HYDROcodone-acetaminophen (NORCO/VICODIN) 5-325 MG tablet, Take 1 tablet by mouth every 4 (four) hours as needed for moderate pain., Disp: 15 tablet, Rfl: 0 .  insulin aspart (NOVOLOG FLEXPEN) 100 UNIT/ML FlexPen, Inject 3-20 Units into the skin 3 (three) times daily as needed for high blood sugar (CBG 150)., Disp: , Rfl:  .  insulin glargine (LANTUS) 100 unit/mL SOPN, Inject 50 Units into the skin daily before breakfast., Disp: , Rfl:  .  lisinopril (PRINIVIL,ZESTRIL) 5 MG tablet, Take 5 mg by mouth daily with breakfast. , Disp: , Rfl: 1 .  phentermine (ADIPEX-P) 37.5 MG tablet, Take 37.5 mg by mouth daily with breakfast. , Disp: , Rfl: 0 .  potassium chloride SA (K-DUR,KLOR-CON) 20 MEQ tablet, Take 1 tablet (20 mEq total) by mouth daily., Disp: 30 tablet, Rfl: 6 .  predniSONE (DELTASONE) 10 MG tablet, Prednisone 30 mg x 2 days then 20 mg x 2 days then 10 mg x 2 days then 5 mg x 2 days then stop, Disp: 13 tablet, Rfl: 0 .  PRESCRIPTION MEDICATION, Inhale into the lungs at bedtime. CPAP, Disp: , Rfl:  .  tiZANidine (ZANAFLEX) 4 MG tablet, Take 4 mg by mouth every 12 (twelve) hours as needed for muscle spasms. Not to exceed 3 doses in 24 hours, Disp: , Rfl: 0 .  torsemide (DEMADEX) 20 MG tablet, Take  60 mg by mouth daily., Disp: , Rfl:  .  Travoprost, BAK Free, (TRAVATAN) 0.004 % SOLN ophthalmic solution, Place 1 drop into both eyes at bedtime., Disp: , Rfl:  Allergies  Allergen Reactions  . Pork-Derived Products Nausea And Vomiting     Social History   Social History  . Marital status: Single    Spouse name: N/A  . Number of children: N/A  . Years of education: N/A   Occupational History  . Not on file.   Social History Main Topics  . Smoking status: Former Smoker    Types: Cigarettes    Quit date: 09/04/1998  . Smokeless tobacco: Never Used  . Alcohol  use Yes     Comment: occasional  . Drug use: No  . Sexual activity: Yes    Birth control/ protection: None   Other Topics Concern  . Not on file   Social History Narrative  . No narrative on file    Physical Exam  Pulmonary/Chest: No respiratory distress.  Abdominal: She exhibits no distension. There is no tenderness. There is no guarding.  Musculoskeletal: She exhibits no edema.  Skin: Skin is warm and dry. She is not diaphoretic.        Future Appointments Date Time Provider [place]  09/03/2017 3:45 PM Tuchman, Leslye Peer, DPM [facility]    ATF pt CAO x4 sitting in her wheelchair inside of the hallway c/o dizziness.  She stated that she had just taken her "water pill".  She didn't take it yesterday due to her having several appointments.  Pt stated that she has been taking her meds as prescribed but some days she takes torsemide later in the day due to her being out and traveling.  Pt denies sob and chest pain. She stated that the dizziness usually goes away after a few mins. Pt stated that using her wheelchair helps her back and feet because she's not walking around as much.  rx bottles verified and pill box refilled.  *rx called in: Lisinopril  LMP 02/16/2017 (Within Weeks)   Weight yesterday- didn't weigh Last visit weight-272 (10/9)    Temesha Queener, EMT Paramedic 07/11/2017    ACTION: Home visit completed

## 2017-08-03 ENCOUNTER — Other Ambulatory Visit (HOSPITAL_COMMUNITY): Payer: Self-pay

## 2017-08-03 NOTE — Progress Notes (Signed)
Paramedicine Encounter    Patient ID: [name], female    DOB: 1956/01/22, 61 y.o.   MRN: [id number]    Patient Care Team: Javier Docker, MD as PCP - General (Internal Medicine)  Patient Active Problem List   Diagnosis Date Noted  . Nausea & vomiting 07/01/2017  . Acute gout due to renal impairment involving left ankle   . Hypertensive heart and renal disease   . SOB (shortness of breath)   . Chronic diastolic heart failure ([place]) 11/30/2016  . CKD (chronic kidney disease) stage 3, GFR 30-59 ml/min (HCC) 11/30/2016  . Thrombocytopenia ([place]) 11/30/2016  . Sinus bradycardia 11/30/2016  . PMB (postmenopausal bleeding) 05/09/2010  . ANEMIA 05/05/2010  . DEPRESSION 04/26/2010  . TMJ SYNDROME 04/26/2010  . DEGENERATIVE DISC DISEASE, CERVICAL SPINE 04/26/2010  . CHEST PAIN 04/26/2010  . CHRONIC OBSTRUCTIVE PULMONARY DISEASE, MODERATE 03/26/2010  . MASTALGIA 10/08/2009  . Iron deficiency anemia 07/12/2009  . Diabetes mellitus ([place]) 07/02/2009  . Hyperlipidemia 07/02/2009  . BMI 45.0-49.9, adult ([place]) 07/02/2009  . OSA (obstructive sleep apnea) 07/02/2009  . Glaucoma 07/02/2009  . Hypertension 07/02/2009  . ASTHMA 07/02/2009  . LOW BACK PAIN SYNDROME 07/02/2009  . BACK PAIN 07/02/2009  . PERIPHERAL EDEMA 07/02/2009  . GROIN PAIN 07/02/2009  . TOBACCO USE, QUIT 07/02/2009  . HEMORRHOIDS, INTERNAL 07/16/2008  . DIVERTICULOSIS OF COLON 07/16/2008    Current Outpatient Medications:  .  aspirin EC 81 MG tablet, Take 81 mg by mouth daily with breakfast., Disp: , Rfl:  .  doxazosin (CARDURA) 4 MG tablet, Take 4 mg by mouth at bedtime., Disp: , Rfl:  .  insulin aspart (NOVOLOG FLEXPEN) 100 UNIT/ML FlexPen, Inject 3-20 Units into the skin 3 (three) times daily as needed for high blood sugar (CBG 150)., Disp: , Rfl:  .  insulin glargine (LANTUS) 100 unit/mL SOPN, Inject 50 Units into the skin daily before breakfast., Disp: , Rfl:  .  lisinopril (PRINIVIL,ZESTRIL) 5 MG tablet,  Take 5 mg by mouth daily with breakfast. , Disp: , Rfl: 1 .  potassium chloride SA (K-DUR,KLOR-CON) 20 MEQ tablet, Take 1 tablet (20 mEq total) by mouth daily., Disp: 30 tablet, Rfl: 6 .  torsemide (DEMADEX) 20 MG tablet, Take 60 mg by mouth daily., Disp: , Rfl:  .  acetaminophen (TYLENOL) 500 MG tablet, Take 500-1,000 mg by mouth every 6 (six) hours as needed for mild pain or headache. , Disp: , Rfl:  .  albuterol (PROVENTIL HFA;VENTOLIN HFA) 108 (90 BASE) MCG/ACT inhaler, Inhale 2 puffs into the lungs every 6 (six) hours as needed for wheezing or shortness of breath. , Disp: , Rfl:  .  dorzolamide-timolol (COSOPT) 22.3-6.8 MG/ML ophthalmic solution, Place 1 drop into both eyes 2 (two) times daily., Disp: , Rfl:  .  HYDROcodone-acetaminophen (NORCO/VICODIN) 5-325 MG tablet, Take 1 tablet by mouth every 4 (four) hours as needed for moderate pain., Disp: 15 tablet, Rfl: 0 .  phentermine (ADIPEX-P) 37.5 MG tablet, Take 37.5 mg by mouth daily with breakfast. , Disp: , Rfl: 0 .  predniSONE (DELTASONE) 10 MG tablet, Prednisone 30 mg x 2 days then 20 mg x 2 days then 10 mg x 2 days then 5 mg x 2 days then stop, Disp: 13 tablet, Rfl: 0 .  PRESCRIPTION MEDICATION, Inhale into the lungs at bedtime. CPAP, Disp: , Rfl:  .  tiZANidine (ZANAFLEX) 4 MG tablet, Take 4 mg by mouth every 12 (twelve) hours as needed for muscle spasms. Not to exceed  3 doses in 24 hours, Disp: , Rfl: 0 .  Travoprost, BAK Free, (TRAVATAN) 0.004 % SOLN ophthalmic solution, Place 1 drop into both eyes at bedtime., Disp: , Rfl:  Allergies  Allergen Reactions  . Pork-Derived Products Nausea And Vomiting     Social History   Socioeconomic History  . Marital status: Single    Spouse name: Not on file  . Number of children: Not on file  . Years of education: Not on file  . Highest education level: Not on file  Social Needs  . Financial resource strain: Not on file  . Food insecurity - worry: Not on file  . Food insecurity -  inability: Not on file  . Transportation needs - medical: Not on file  . Transportation needs - non-medical: Not on file  Occupational History  . Not on file  Tobacco Use  . Smoking status: Former Smoker    Types: Cigarettes    Last attempt to quit: 09/04/1998    Years since quitting: 18.9  . Smokeless tobacco: Never Used  Substance and Sexual Activity  . Alcohol use: Yes    Comment: occasional  . Drug use: No  . Sexual activity: Yes    Birth control/protection: None  Other Topics Concern  . Not on file  Social History Narrative  . Not on file    Physical Exam  Pulmonary/Chest: No respiratory distress.  Abdominal: She exhibits no distension. There is no tenderness. There is no guarding.  Musculoskeletal: She exhibits edema.  Skin: Skin is warm and dry. She is not diaphoretic.        Future Appointments  Date Time Provider [place]  09/03/2017  3:45 PM Tuchman, Leslye Peer, DPM [facility]    ATF pt CAO x4 sitting in her wheelchair with no complaints. Pt stated that she was approved to have an aide come to her house and assist her with house hold chores and bathing.  Pt is unsure on the company, days and hours that the aide will be coming.  Pt stated that she has been taking her meds directly from the pill bottles since our last visit. Pt stated that she uses her CPAP at night but sometimes take it off.  Pt denies sob, dizziness, and chest pain.  rx bottles verified and pill box refilled.   BP (!) 150/70   Pulse (!) 59   Resp 16   LMP 02/16/2017 (Within Weeks)   SpO2 99%   Weight yesterday-didn't weigh Last visit weight    [name], EMT Paramedic 08/03/2017    ACTION: Home visit completed

## 2017-08-08 ENCOUNTER — Other Ambulatory Visit (HOSPITAL_COMMUNITY): Payer: Self-pay

## 2017-08-08 ENCOUNTER — Encounter (HOSPITAL_COMMUNITY): Payer: Self-pay

## 2017-08-08 NOTE — Progress Notes (Signed)
Paramedicine Encounter    Patient ID: Yajaira Doffing, female    DOB: 12-03-1955, 61 y.o.   MRN: [id number]    Patient Care Team: Javier Docker, MD as PCP - General (Internal Medicine)  Patient Active Problem List   Diagnosis Date Noted  . Nausea & vomiting 07/01/2017  . Acute gout due to renal impairment involving left ankle   . Hypertensive heart and renal disease   . SOB (shortness of breath)   . Chronic diastolic heart failure ([place]) 11/30/2016  . CKD (chronic kidney disease) stage 3, GFR 30-59 ml/min (HCC) 11/30/2016  . Thrombocytopenia ([place]) 11/30/2016  . Sinus bradycardia 11/30/2016  . PMB (postmenopausal bleeding) 05/09/2010  . ANEMIA 05/05/2010  . DEPRESSION 04/26/2010  . TMJ SYNDROME 04/26/2010  . DEGENERATIVE DISC DISEASE, CERVICAL SPINE 04/26/2010  . CHEST PAIN 04/26/2010  . CHRONIC OBSTRUCTIVE PULMONARY DISEASE, MODERATE 03/26/2010  . MASTALGIA 10/08/2009  . Iron deficiency anemia 07/12/2009  . Diabetes mellitus ([place]) 07/02/2009  . Hyperlipidemia 07/02/2009  . BMI 45.0-49.9, adult ([place]) 07/02/2009  . OSA (obstructive sleep apnea) 07/02/2009  . Glaucoma 07/02/2009  . Hypertension 07/02/2009  . ASTHMA 07/02/2009  . LOW BACK PAIN SYNDROME 07/02/2009  . BACK PAIN 07/02/2009  . PERIPHERAL EDEMA 07/02/2009  . GROIN PAIN 07/02/2009  . TOBACCO USE, QUIT 07/02/2009  . HEMORRHOIDS, INTERNAL 07/16/2008  . DIVERTICULOSIS OF COLON 07/16/2008    Current Outpatient Medications:  .  aspirin EC 81 MG tablet, Take 81 mg by mouth daily with breakfast., Disp: , Rfl:  .  doxazosin (CARDURA) 4 MG tablet, Take 4 mg by mouth at bedtime., Disp: , Rfl:  .  lisinopril (PRINIVIL,ZESTRIL) 5 MG tablet, Take 5 mg by mouth daily with breakfast. , Disp: , Rfl: 1 .  phentermine (ADIPEX-P) 37.5 MG tablet, Take 37.5 mg by mouth daily with breakfast. , Disp: , Rfl: 0 .  potassium chloride SA (K-DUR,KLOR-CON) 20 MEQ tablet, Take 1 tablet (20 mEq total) by mouth daily., Disp: 30 tablet, Rfl:  6 .  torsemide (DEMADEX) 20 MG tablet, Take 60 mg by mouth daily., Disp: , Rfl:  .  acetaminophen (TYLENOL) 500 MG tablet, Take 500-1,000 mg by mouth every 6 (six) hours as needed for mild pain or headache. , Disp: , Rfl:  .  albuterol (PROVENTIL HFA;VENTOLIN HFA) 108 (90 BASE) MCG/ACT inhaler, Inhale 2 puffs into the lungs every 6 (six) hours as needed for wheezing or shortness of breath. , Disp: , Rfl:  .  dorzolamide-timolol (COSOPT) 22.3-6.8 MG/ML ophthalmic solution, Place 1 drop into both eyes 2 (two) times daily., Disp: , Rfl:  .  HYDROcodone-acetaminophen (NORCO/VICODIN) 5-325 MG tablet, Take 1 tablet by mouth every 4 (four) hours as needed for moderate pain., Disp: 15 tablet, Rfl: 0 .  insulin aspart (NOVOLOG FLEXPEN) 100 UNIT/ML FlexPen, Inject 3-20 Units into the skin 3 (three) times daily as needed for high blood sugar (CBG 150)., Disp: , Rfl:  .  insulin glargine (LANTUS) 100 unit/mL SOPN, Inject 50 Units into the skin daily before breakfast., Disp: , Rfl:  .  predniSONE (DELTASONE) 10 MG tablet, Prednisone 30 mg x 2 days then 20 mg x 2 days then 10 mg x 2 days then 5 mg x 2 days then stop, Disp: 13 tablet, Rfl: 0 .  PRESCRIPTION MEDICATION, Inhale into the lungs at bedtime. CPAP, Disp: , Rfl:  .  tiZANidine (ZANAFLEX) 4 MG tablet, Take 4 mg by mouth every 12 (twelve) hours as needed for muscle spasms. Not to exceed  3 doses in 24 hours, Disp: , Rfl: 0 .  Travoprost, BAK Free, (TRAVATAN) 0.004 % SOLN ophthalmic solution, Place 1 drop into both eyes at bedtime., Disp: , Rfl:  Allergies  Allergen Reactions  . Pork-Derived Products Nausea And Vomiting     Social History   Socioeconomic History  . Marital status: Single    Spouse name: Not on file  . Number of children: Not on file  . Years of education: Not on file  . Highest education level: Not on file  Social Needs  . Financial resource strain: Not on file  . Food insecurity - worry: Not on file  . Food insecurity -  inability: Not on file  . Transportation needs - medical: Not on file  . Transportation needs - non-medical: Not on file  Occupational History  . Not on file  Tobacco Use  . Smoking status: Former Smoker    Types: Cigarettes    Last attempt to quit: 09/04/1998    Years since quitting: 18.9  . Smokeless tobacco: Never Used  Substance and Sexual Activity  . Alcohol use: Yes    Comment: occasional  . Drug use: No  . Sexual activity: Yes    Birth control/protection: None  Other Topics Concern  . Not on file  Social History Narrative  . Not on file    Physical Exam  Pulmonary/Chest: No respiratory distress. She has no wheezes. She has no rales.  Abdominal: She exhibits no distension. There is no tenderness. There is no guarding.  Musculoskeletal: She exhibits no edema.  Skin: Skin is warm and dry. She is not diaphoretic.        Future Appointments  Date Time Provider [place]  09/03/2017  3:45 PM Tuchman, Leslye Peer, DPM [facility]    ATF pt CAO x4 sitting in her wheelchair in the kitchen cooking with the help of her aide. Pt stated that she is able to get so much done with the help of her aide and she appears to be in better spirits. Pt's pill box is full, she stated that she had been taking the pills directly from the pill bottles this past week. Pt denies sob, dizziness and chest pain.  Pt weight is down, she stated that its due to her weight loss medications and she's getting around better.  Pt is now sleeping in her bed and is using her CPAP nightly. rx bottles verified and pill box refilled (2). Pt was given a new scale so she can start weighing daily again, the last one broke.  **rx called in: Lisinopril filled until tues 2nd both none on wed Colchicine filled until thurs 2nd box/none in   BP 126/60   Pulse 63   Resp 16   Wt 259 lb 12.8 oz (117.8 kg)   LMP 02/16/2017 (Within Weeks)   BMI 48.69 kg/m       Amandalee Lacap, EMT  Paramedic 08/08/2017    ACTION: Home visit completed Next visit planned for dec 5

## 2017-08-14 ENCOUNTER — Other Ambulatory Visit (HOSPITAL_COMMUNITY): Payer: Self-pay

## 2017-08-14 NOTE — Progress Notes (Signed)
Paramedicine Encounter    Patient ID: [name], female    DOB: 03/19/1956, 61 y.o.   MRN: [id number]   Patient Care Team: Javier Docker, MD as PCP - General (Internal Medicine)  Patient Active Problem List   Diagnosis Date Noted  . Nausea & vomiting 07/01/2017  . Acute gout due to renal impairment involving left ankle   . Hypertensive heart and renal disease   . SOB (shortness of breath)   . Chronic diastolic heart failure ([place]) 11/30/2016  . CKD (chronic kidney disease) stage 3, GFR 30-59 ml/min (HCC) 11/30/2016  . Thrombocytopenia ([place]) 11/30/2016  . Sinus bradycardia 11/30/2016  . PMB (postmenopausal bleeding) 05/09/2010  . ANEMIA 05/05/2010  . DEPRESSION 04/26/2010  . TMJ SYNDROME 04/26/2010  . DEGENERATIVE DISC DISEASE, CERVICAL SPINE 04/26/2010  . CHEST PAIN 04/26/2010  . CHRONIC OBSTRUCTIVE PULMONARY DISEASE, MODERATE 03/26/2010  . MASTALGIA 10/08/2009  . Iron deficiency anemia 07/12/2009  . Diabetes mellitus ([place]) 07/02/2009  . Hyperlipidemia 07/02/2009  . BMI 45.0-49.9, adult ([place]) 07/02/2009  . OSA (obstructive sleep apnea) 07/02/2009  . Glaucoma 07/02/2009  . Hypertension 07/02/2009  . ASTHMA 07/02/2009  . LOW BACK PAIN SYNDROME 07/02/2009  . BACK PAIN 07/02/2009  . PERIPHERAL EDEMA 07/02/2009  . GROIN PAIN 07/02/2009  . TOBACCO USE, QUIT 07/02/2009  . HEMORRHOIDS, INTERNAL 07/16/2008  . DIVERTICULOSIS OF COLON 07/16/2008    Current Outpatient Medications:  .  acetaminophen (TYLENOL) 500 MG tablet, Take 500-1,000 mg by mouth every 6 (six) hours as needed for mild pain or headache. , Disp: , Rfl:  .  albuterol (PROVENTIL HFA;VENTOLIN HFA) 108 (90 BASE) MCG/ACT inhaler, Inhale 2 puffs into the lungs every 6 (six) hours as needed for wheezing or shortness of breath. , Disp: , Rfl:  .  aspirin EC 81 MG tablet, Take 81 mg by mouth daily with breakfast., Disp: , Rfl:  .  dorzolamide-timolol (COSOPT) 22.3-6.8 MG/ML ophthalmic solution, Place 1 drop into  both eyes 2 (two) times daily., Disp: , Rfl:  .  doxazosin (CARDURA) 4 MG tablet, Take 4 mg by mouth at bedtime., Disp: , Rfl:  .  HYDROcodone-acetaminophen (NORCO/VICODIN) 5-325 MG tablet, Take 1 tablet by mouth every 4 (four) hours as needed for moderate pain., Disp: 15 tablet, Rfl: 0 .  insulin aspart (NOVOLOG FLEXPEN) 100 UNIT/ML FlexPen, Inject 3-20 Units into the skin 3 (three) times daily as needed for high blood sugar (CBG 150)., Disp: , Rfl:  .  insulin glargine (LANTUS) 100 unit/mL SOPN, Inject 50 Units into the skin daily before breakfast., Disp: , Rfl:  .  lisinopril (PRINIVIL,ZESTRIL) 5 MG tablet, Take 5 mg by mouth daily with breakfast. , Disp: , Rfl: 1 .  phentermine (ADIPEX-P) 37.5 MG tablet, Take 37.5 mg by mouth daily with breakfast. , Disp: , Rfl: 0 .  potassium chloride SA (K-DUR,KLOR-CON) 20 MEQ tablet, Take 1 tablet (20 mEq total) by mouth daily., Disp: 30 tablet, Rfl: 6 .  predniSONE (DELTASONE) 10 MG tablet, Prednisone 30 mg x 2 days then 20 mg x 2 days then 10 mg x 2 days then 5 mg x 2 days then stop, Disp: 13 tablet, Rfl: 0 .  PRESCRIPTION MEDICATION, Inhale into the lungs at bedtime. CPAP, Disp: , Rfl:  .  tiZANidine (ZANAFLEX) 4 MG tablet, Take 4 mg by mouth every 12 (twelve) hours as needed for muscle spasms. Not to exceed 3 doses in 24 hours, Disp: , Rfl: 0 .  torsemide (DEMADEX) 20 MG tablet, Take 60  mg by mouth daily., Disp: , Rfl:  .  Travoprost, BAK Free, (TRAVATAN) 0.004 % SOLN ophthalmic solution, Place 1 drop into both eyes at bedtime., Disp: , Rfl:  Allergies  Allergen Reactions  . Pork-Derived Products Nausea And Vomiting     Social History   Socioeconomic History  . Marital status: Single    Spouse name: Not on file  . Number of children: Not on file  . Years of education: Not on file  . Highest education level: Not on file  Social Needs  . Financial resource strain: Not on file  . Food insecurity - worry: Not on file  . Food insecurity -  inability: Not on file  . Transportation needs - medical: Not on file  . Transportation needs - non-medical: Not on file  Occupational History  . Not on file  Tobacco Use  . Smoking status: Former Smoker    Types: Cigarettes    Last attempt to quit: 09/04/1998    Years since quitting: 18.9  . Smokeless tobacco: Never Used  Substance and Sexual Activity  . Alcohol use: Yes    Comment: occasional  . Drug use: No  . Sexual activity: Yes    Birth control/protection: None  Other Topics Concern  . Not on file  Social History Narrative  . Not on file    Physical Exam      Future Appointments  Date Time Provider [place]  09/03/2017  3:45 PM Tuchman, Leslye Peer, DPM [facility]   BP 122/60   Pulse 62   Resp 15   Wt 257 lb (116.6 kg)   LMP 02/16/2017 (Within Weeks)   BMI 48.16 kg/m  Weight yesterday-258 Last visit weight-259 CBG PTA-128 [facility] EMS-123  Pt reports she is doing well, pts home aide has not been here for several days due to illness but a replacement was not sent for her.she called the agency and someone else will be sent out tomor.  pt in her wheelchair eating sugar free ice cream. She states she follows low sodium diet. She already has 2 wks of pill boxes done. Pt reports missing numerous doses of her potassium. Advised her of the importance of taking her potassium. Her rt foot/ankle/leg is swollen-more so than the left-she reports its always more swollen due to previous injury.   ACTION: Home visit completed  Marylouise Stacks, EMT-Paramedic 08/14/17

## 2017-08-22 ENCOUNTER — Other Ambulatory Visit (HOSPITAL_COMMUNITY): Payer: Self-pay

## 2017-08-22 NOTE — Progress Notes (Signed)
Paramedicine Encounter    Patient ID: [name], female    DOB: 1956-06-10, 61 y.o.   MRN: [id number]    Patient Care Team: Javier Docker, MD as PCP - General (Internal Medicine)  Patient Active Problem List   Diagnosis Date Noted  . Nausea & vomiting 07/01/2017  . Acute gout due to renal impairment involving left ankle   . Hypertensive heart and renal disease   . SOB (shortness of breath)   . Chronic diastolic heart failure ([place]) 11/30/2016  . CKD (chronic kidney disease) stage 3, GFR 30-59 ml/min (HCC) 11/30/2016  . Thrombocytopenia ([place]) 11/30/2016  . Sinus bradycardia 11/30/2016  . PMB (postmenopausal bleeding) 05/09/2010  . ANEMIA 05/05/2010  . DEPRESSION 04/26/2010  . TMJ SYNDROME 04/26/2010  . DEGENERATIVE DISC DISEASE, CERVICAL SPINE 04/26/2010  . CHEST PAIN 04/26/2010  . CHRONIC OBSTRUCTIVE PULMONARY DISEASE, MODERATE 03/26/2010  . MASTALGIA 10/08/2009  . Iron deficiency anemia 07/12/2009  . Diabetes mellitus ([place]) 07/02/2009  . Hyperlipidemia 07/02/2009  . BMI 45.0-49.9, adult ([place]) 07/02/2009  . OSA (obstructive sleep apnea) 07/02/2009  . Glaucoma 07/02/2009  . Hypertension 07/02/2009  . ASTHMA 07/02/2009  . LOW BACK PAIN SYNDROME 07/02/2009  . BACK PAIN 07/02/2009  . PERIPHERAL EDEMA 07/02/2009  . GROIN PAIN 07/02/2009  . TOBACCO USE, QUIT 07/02/2009  . HEMORRHOIDS, INTERNAL 07/16/2008  . DIVERTICULOSIS OF COLON 07/16/2008    Current Outpatient Medications:  .  aspirin EC 81 MG tablet, Take 81 mg by mouth daily with breakfast., Disp: , Rfl:  .  doxazosin (CARDURA) 4 MG tablet, Take 4 mg by mouth at bedtime., Disp: , Rfl:  .  lisinopril (PRINIVIL,ZESTRIL) 5 MG tablet, Take 5 mg by mouth daily with breakfast. , Disp: , Rfl: 1 .  potassium chloride SA (K-DUR,KLOR-CON) 20 MEQ tablet, Take 1 tablet (20 mEq total) by mouth daily., Disp: 30 tablet, Rfl: 6 .  torsemide (DEMADEX) 20 MG tablet, Take 60 mg by mouth daily., Disp: , Rfl:  .  acetaminophen  (TYLENOL) 500 MG tablet, Take 500-1,000 mg by mouth every 6 (six) hours as needed for mild pain or headache. , Disp: , Rfl:  .  albuterol (PROVENTIL HFA;VENTOLIN HFA) 108 (90 BASE) MCG/ACT inhaler, Inhale 2 puffs into the lungs every 6 (six) hours as needed for wheezing or shortness of breath. , Disp: , Rfl:  .  dorzolamide-timolol (COSOPT) 22.3-6.8 MG/ML ophthalmic solution, Place 1 drop into both eyes 2 (two) times daily., Disp: , Rfl:  .  HYDROcodone-acetaminophen (NORCO/VICODIN) 5-325 MG tablet, Take 1 tablet by mouth every 4 (four) hours as needed for moderate pain., Disp: 15 tablet, Rfl: 0 .  insulin aspart (NOVOLOG FLEXPEN) 100 UNIT/ML FlexPen, Inject 3-20 Units into the skin 3 (three) times daily as needed for high blood sugar (CBG 150)., Disp: , Rfl:  .  insulin glargine (LANTUS) 100 unit/mL SOPN, Inject 50 Units into the skin daily before breakfast., Disp: , Rfl:  .  phentermine (ADIPEX-P) 37.5 MG tablet, Take 37.5 mg by mouth daily with breakfast. , Disp: , Rfl: 0 .  predniSONE (DELTASONE) 10 MG tablet, Prednisone 30 mg x 2 days then 20 mg x 2 days then 10 mg x 2 days then 5 mg x 2 days then stop, Disp: 13 tablet, Rfl: 0 .  PRESCRIPTION MEDICATION, Inhale into the lungs at bedtime. CPAP, Disp: , Rfl:  .  tiZANidine (ZANAFLEX) 4 MG tablet, Take 4 mg by mouth every 12 (twelve) hours as needed for muscle spasms. Not to exceed  3 doses in 24 hours, Disp: , Rfl: 0 .  Travoprost, BAK Free, (TRAVATAN) 0.004 % SOLN ophthalmic solution, Place 1 drop into both eyes at bedtime., Disp: , Rfl:  Allergies  Allergen Reactions  . Pork-Derived Products Nausea And Vomiting     Social History   Socioeconomic History  . Marital status: Single    Spouse name: Not on file  . Number of children: Not on file  . Years of education: Not on file  . Highest education level: Not on file  Social Needs  . Financial resource strain: Not on file  . Food insecurity - worry: Not on file  . Food insecurity -  inability: Not on file  . Transportation needs - medical: Not on file  . Transportation needs - non-medical: Not on file  Occupational History  . Not on file  Tobacco Use  . Smoking status: Former Smoker    Types: Cigarettes    Last attempt to quit: 09/04/1998    Years since quitting: 18.9  . Smokeless tobacco: Never Used  Substance and Sexual Activity  . Alcohol use: Yes    Comment: occasional  . Drug use: No  . Sexual activity: Yes    Birth control/protection: None  Other Topics Concern  . Not on file  Social History Narrative  . Not on file    Physical Exam  Pulmonary/Chest: No respiratory distress. She has no wheezes. She has no rales.  Abdominal: She exhibits no distension. There is no tenderness. There is no guarding.  Musculoskeletal: She exhibits no edema.  Skin: Skin is warm and dry. She is not diaphoretic.        Future Appointments  Date Time Provider [place]  09/03/2017  3:45 PM Tuchman, Leslye Peer, DPM [facility]    ATF pt CAO x4 sitting in her wheelchair talking with her home aide.  Pt stated that she feels good today and she denies having any pain.  Pt missed taking her meds on Sunday and Monday morning. She stated that she forgot on Sunday and had plans on Monday; her initiations were to take them when she got home but it was too late in the day.  Pt did gain several pounds during those two days according to her calendar.  This am her weight has decreased.  Pt denies sob, chest pain, and dizziness.  She stated that she uses her CPAP most nights but some nights she forgets to put it on prior to going to sleep.    Pt has a procedure scheduled for next Wednesday that is supposed to eliminate the pain in her back.  She stated that the pain has been tolerable lately and she's not in pain today.  Pt has started putting the potassium pills in her applesause in the mornings.  She has a new aide that assists her in grooming and personal care.  rx  bottles verified and pill box refilled.   BP 122/60 (BP Location: Right Arm, Patient Position: Sitting, Cuff Size: Large)   Pulse 62   Wt 258 lb (117 kg)   LMP 02/16/2017 (Within Weeks)   SpO2 98%   BMI 48.35 kg/m    cbg 165 *taking colchicine once daily  Weight yesterday-261 Last visit weight-257    Rabab Currington, EMT Paramedic 08/22/2017    ACTION: Home visit completed

## 2017-08-28 ENCOUNTER — Encounter (HOSPITAL_COMMUNITY): Payer: Self-pay

## 2017-08-28 ENCOUNTER — Other Ambulatory Visit (HOSPITAL_COMMUNITY): Payer: Self-pay

## 2017-08-28 NOTE — Progress Notes (Signed)
Paramedicine Encounter    Patient ID: [name], female    DOB: Jul 01, 1956, 61 y.o.   MRN: [id number]    Patient Care Team: Javier Docker, MD as PCP - General (Internal Medicine)  Patient Active Problem List   Diagnosis Date Noted  . Nausea & vomiting 07/01/2017  . Acute gout due to renal impairment involving left ankle   . Hypertensive heart and renal disease   . SOB (shortness of breath)   . Chronic diastolic heart failure ([place]) 11/30/2016  . CKD (chronic kidney disease) stage 3, GFR 30-59 ml/min (HCC) 11/30/2016  . Thrombocytopenia ([place]) 11/30/2016  . Sinus bradycardia 11/30/2016  . PMB (postmenopausal bleeding) 05/09/2010  . ANEMIA 05/05/2010  . DEPRESSION 04/26/2010  . TMJ SYNDROME 04/26/2010  . DEGENERATIVE DISC DISEASE, CERVICAL SPINE 04/26/2010  . CHEST PAIN 04/26/2010  . CHRONIC OBSTRUCTIVE PULMONARY DISEASE, MODERATE 03/26/2010  . MASTALGIA 10/08/2009  . Iron deficiency anemia 07/12/2009  . Diabetes mellitus ([place]) 07/02/2009  . Hyperlipidemia 07/02/2009  . BMI 45.0-49.9, adult ([place]) 07/02/2009  . OSA (obstructive sleep apnea) 07/02/2009  . Glaucoma 07/02/2009  . Hypertension 07/02/2009  . ASTHMA 07/02/2009  . LOW BACK PAIN SYNDROME 07/02/2009  . BACK PAIN 07/02/2009  . PERIPHERAL EDEMA 07/02/2009  . GROIN PAIN 07/02/2009  . TOBACCO USE, QUIT 07/02/2009  . HEMORRHOIDS, INTERNAL 07/16/2008  . DIVERTICULOSIS OF COLON 07/16/2008    Current Outpatient Medications:  .  aspirin EC 81 MG tablet, Take 81 mg by mouth daily with breakfast., Disp: , Rfl:  .  doxazosin (CARDURA) 4 MG tablet, Take 4 mg by mouth at bedtime., Disp: , Rfl:  .  HYDROcodone-acetaminophen (NORCO/VICODIN) 5-325 MG tablet, Take 1 tablet by mouth every 4 (four) hours as needed for moderate pain., Disp: 15 tablet, Rfl: 0 .  insulin aspart (NOVOLOG FLEXPEN) 100 UNIT/ML FlexPen, Inject 3-20 Units into the skin 3 (three) times daily as needed for high blood sugar (CBG 150)., Disp: , Rfl:  .   insulin glargine (LANTUS) 100 unit/mL SOPN, Inject 50 Units into the skin daily before breakfast., Disp: , Rfl:  .  lisinopril (PRINIVIL,ZESTRIL) 5 MG tablet, Take 5 mg by mouth daily with breakfast. , Disp: , Rfl: 1 .  phentermine (ADIPEX-P) 37.5 MG tablet, Take 37.5 mg by mouth daily with breakfast. , Disp: , Rfl: 0 .  potassium chloride SA (K-DUR,KLOR-CON) 20 MEQ tablet, Take 1 tablet (20 mEq total) by mouth daily., Disp: 30 tablet, Rfl: 6 .  torsemide (DEMADEX) 20 MG tablet, Take 60 mg by mouth daily., Disp: , Rfl:  .  acetaminophen (TYLENOL) 500 MG tablet, Take 500-1,000 mg by mouth every 6 (six) hours as needed for mild pain or headache. , Disp: , Rfl:  .  albuterol (PROVENTIL HFA;VENTOLIN HFA) 108 (90 BASE) MCG/ACT inhaler, Inhale 2 puffs into the lungs every 6 (six) hours as needed for wheezing or shortness of breath. , Disp: , Rfl:  .  dorzolamide-timolol (COSOPT) 22.3-6.8 MG/ML ophthalmic solution, Place 1 drop into both eyes 2 (two) times daily., Disp: , Rfl:  .  predniSONE (DELTASONE) 10 MG tablet, Prednisone 30 mg x 2 days then 20 mg x 2 days then 10 mg x 2 days then 5 mg x 2 days then stop, Disp: 13 tablet, Rfl: 0 .  PRESCRIPTION MEDICATION, Inhale into the lungs at bedtime. CPAP, Disp: , Rfl:  .  tiZANidine (ZANAFLEX) 4 MG tablet, Take 4 mg by mouth every 12 (twelve) hours as needed for muscle spasms. Not to exceed  3 doses in 24 hours, Disp: , Rfl: 0 .  Travoprost, BAK Free, (TRAVATAN) 0.004 % SOLN ophthalmic solution, Place 1 drop into both eyes at bedtime., Disp: , Rfl:  Allergies  Allergen Reactions  . Pork-Derived Products Nausea And Vomiting     Social History   Socioeconomic History  . Marital status: Single    Spouse name: Not on file  . Number of children: Not on file  . Years of education: Not on file  . Highest education level: Not on file  Social Needs  . Financial resource strain: Not on file  . Food insecurity - worry: Not on file  . Food insecurity -  inability: Not on file  . Transportation needs - medical: Not on file  . Transportation needs - non-medical: Not on file  Occupational History  . Not on file  Tobacco Use  . Smoking status: Former Smoker    Types: Cigarettes    Last attempt to quit: 09/04/1998    Years since quitting: 18.9  . Smokeless tobacco: Never Used  Substance and Sexual Activity  . Alcohol use: Yes    Comment: occasional  . Drug use: No  . Sexual activity: Yes    Birth control/protection: None  Other Topics Concern  . Not on file  Social History Narrative  . Not on file    Physical Exam  Pulmonary/Chest: No respiratory distress. She has no wheezes. She has no rales.  Abdominal: She exhibits no distension. There is no tenderness. There is no guarding.  Musculoskeletal: She exhibits edema.  +2 both feet and above her ankles  Skin: She is not diaphoretic.        Future Appointments  Date Time Provider [place]  09/03/2017  3:45 PM Tuchman, Leslye Peer, DPM [facility]    ATF pt CAO x4 sitting in her wheelchair talking with [facility] about tomorrow pick up.  Pt has an procedure scheduled for tomorrow to help with chronic back pain but due to the road conditions she must reschedule.  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  She forgot to take her all of her morning medications which includes both long acting and short acting insulin.  Pt figured this out during my assessment; her CG is 328.  Pt missed sundays morning meds but has taken the rest for week. Pt denies sob, chest pain, and dizziness.  Pt stated that she still feel tired during the day; her pcp stated that its due to her Vit D levels were low.  They prescribed her supplements for 8 weeks and she has a f/u appointment scheduled.  rx bottles verified and pill box refilled.   BP 122/60   Pulse 72   Resp 16   Wt 256 lb (116.1 kg)   LMP 02/16/2017 (Within Weeks)   SpO2 95%   BMI 47.98 kg/m     cbg 328 she hasn't taken any insulin all day. Pt will take 50 units of lantus per pt  Weight yesterday-257 (2/9) Last visit weight-258    Laurens Matheny, EMT Paramedic 08/28/2017    ACTION: Home visit completed Next visit planned for next week

## 2017-09-03 ENCOUNTER — Ambulatory Visit: Payer: Medicaid Other | Admitting: Podiatry

## 2017-09-13 ENCOUNTER — Other Ambulatory Visit (HOSPITAL_COMMUNITY): Payer: Self-pay

## 2017-09-13 ENCOUNTER — Other Ambulatory Visit (HOSPITAL_COMMUNITY): Payer: Self-pay | Admitting: Pharmacist

## 2017-09-13 MED ORDER — ATORVASTATIN CALCIUM 10 MG PO TABS: 10.0000 mg | ORAL_TABLET | Freq: Every day | ORAL | 3 refills | Status: DC

## 2017-09-13 MED ORDER — VITAMIN D (ERGOCALCIFEROL) 1.25 MG (50000 UNIT) PO CAPS: 50000.0000 [IU] | ORAL_CAPSULE | ORAL | Status: AC

## 2017-09-13 NOTE — Progress Notes (Signed)
Paramedicine Encounter    Patient ID: [name], female    DOB: 22-Feb-1956, 61 y.o.   MRN: [id number]    Patient Care Team: Javier Docker, MD as PCP - General (Internal Medicine)  Patient Active Problem List   Diagnosis Date Noted  . Nausea & vomiting 07/01/2017  . Acute gout due to renal impairment involving left ankle   . Hypertensive heart and renal disease   . SOB (shortness of breath)   . Chronic diastolic heart failure ([place]) 11/30/2016  . CKD (chronic kidney disease) stage 3, GFR 30-59 ml/min (HCC) 11/30/2016  . Thrombocytopenia ([place]) 11/30/2016  . Sinus bradycardia 11/30/2016  . PMB (postmenopausal bleeding) 05/09/2010  . ANEMIA 05/05/2010  . DEPRESSION 04/26/2010  . TMJ SYNDROME 04/26/2010  . DEGENERATIVE DISC DISEASE, CERVICAL SPINE 04/26/2010  . CHEST PAIN 04/26/2010  . CHRONIC OBSTRUCTIVE PULMONARY DISEASE, MODERATE 03/26/2010  . MASTALGIA 10/08/2009  . Iron deficiency anemia 07/12/2009  . Diabetes mellitus ([place]) 07/02/2009  . Hyperlipidemia 07/02/2009  . BMI 45.0-49.9, adult ([place]) 07/02/2009  . OSA (obstructive sleep apnea) 07/02/2009  . Glaucoma 07/02/2009  . Hypertension 07/02/2009  . ASTHMA 07/02/2009  . LOW BACK PAIN SYNDROME 07/02/2009  . BACK PAIN 07/02/2009  . PERIPHERAL EDEMA 07/02/2009  . GROIN PAIN 07/02/2009  . TOBACCO USE, QUIT 07/02/2009  . HEMORRHOIDS, INTERNAL 07/16/2008  . DIVERTICULOSIS OF COLON 07/16/2008    Current Outpatient Medications:  .  aspirin EC 81 MG tablet, Take 81 mg by mouth daily with breakfast., Disp: , Rfl:  .  doxazosin (CARDURA) 4 MG tablet, Take 4 mg by mouth at bedtime., Disp: , Rfl:  .  lisinopril (PRINIVIL,ZESTRIL) 5 MG tablet, Take 5 mg by mouth daily with breakfast. , Disp: , Rfl: 1 .  phentermine (ADIPEX-P) 37.5 MG tablet, Take 37.5 mg by mouth daily with breakfast. , Disp: , Rfl: 0 .  acetaminophen (TYLENOL) 500 MG tablet, Take 500-1,000 mg by mouth every 6 (six) hours as needed for mild pain or headache.  , Disp: , Rfl:  .  albuterol (PROVENTIL HFA;VENTOLIN HFA) 108 (90 BASE) MCG/ACT inhaler, Inhale 2 puffs into the lungs every 6 (six) hours as needed for wheezing or shortness of breath. , Disp: , Rfl:  .  dorzolamide-timolol (COSOPT) 22.3-6.8 MG/ML ophthalmic solution, Place 1 drop into both eyes 2 (two) times daily., Disp: , Rfl:  .  HYDROcodone-acetaminophen (NORCO/VICODIN) 5-325 MG tablet, Take 1 tablet by mouth every 4 (four) hours as needed for moderate pain., Disp: 15 tablet, Rfl: 0 .  insulin aspart (NOVOLOG FLEXPEN) 100 UNIT/ML FlexPen, Inject 3-20 Units into the skin 3 (three) times daily as needed for high blood sugar (CBG 150)., Disp: , Rfl:  .  insulin glargine (LANTUS) 100 unit/mL SOPN, Inject 50 Units into the skin daily before breakfast., Disp: , Rfl:  .  potassium chloride SA (K-DUR,KLOR-CON) 20 MEQ tablet, Take 1 tablet (20 mEq total) by mouth daily., Disp: 30 tablet, Rfl: 6 .  predniSONE (DELTASONE) 10 MG tablet, Prednisone 30 mg x 2 days then 20 mg x 2 days then 10 mg x 2 days then 5 mg x 2 days then stop, Disp: 13 tablet, Rfl: 0 .  PRESCRIPTION MEDICATION, Inhale into the lungs at bedtime. CPAP, Disp: , Rfl:  .  tiZANidine (ZANAFLEX) 4 MG tablet, Take 4 mg by mouth every 12 (twelve) hours as needed for muscle spasms. Not to exceed 3 doses in 24 hours, Disp: , Rfl: 0 .  torsemide (DEMADEX) 20 MG tablet, Take  60 mg by mouth daily., Disp: , Rfl:  .  Travoprost, BAK Free, (TRAVATAN) 0.004 % SOLN ophthalmic solution, Place 1 drop into both eyes at bedtime., Disp: , Rfl:  Allergies  Allergen Reactions  . Pork-Derived Products Nausea And Vomiting     Social History   Socioeconomic History  . Marital status: Single    Spouse name: Not on file  . Number of children: Not on file  . Years of education: Not on file  . Highest education level: Not on file  Social Needs  . Financial resource strain: Not on file  . Food insecurity - worry: Not on file  . Food insecurity - inability:  Not on file  . Transportation needs - medical: Not on file  . Transportation needs - non-medical: Not on file  Occupational History  . Not on file  Tobacco Use  . Smoking status: Former Smoker    Types: Cigarettes    Last attempt to quit: 09/04/1998    Years since quitting: 19.0  . Smokeless tobacco: Never Used  Substance and Sexual Activity  . Alcohol use: Yes    Comment: occasional  . Drug use: No  . Sexual activity: Yes    Birth control/protection: None  Other Topics Concern  . Not on file  Social History Narrative  . Not on file    Physical Exam  Pulmonary/Chest: No respiratory distress. She has no wheezes.  Abdominal: She exhibits no distension. There is no tenderness. There is no guarding.  Musculoskeletal: She exhibits edema.  Both feet and lower legs  Skin: Skin is warm and dry. She is not diaphoretic.        Future Appointments  Date Time Provider [place]  10/01/2017  3:00 PM Gean Birchwood, DPM [facility]  10/03/2017 10:00 AM Constant, Vickii Chafe, MD [facility] None    ATF pt CAO intermitted chest tightness which goes away on its own.  She would like an earlier appointment because she's worried about her kidney function.  She feel that the "fluid pills is taking a lot out of her (energy)".  Pt stated that she's still trying to eat low sodium and sugar free foods.  Pt denies sob, dizziness and chest pain.  She admits to not wearing her CPAP nightly because she falls asleep before she has the chance to put it on. rx bottles verified and pill box refilled.  BP (!) 124/50   Pulse 63   Wt 259 lb (117.5 kg)   LMP 02/16/2017 (Within Weeks)   SpO2 100%   BMI 48.54 kg/m   Weight yesterday-257 Last visit weight-256    Ziza Hastings, EMT Paramedic 09/13/2017    ACTION: Home visit completed Next visit planned for next thurs

## 2017-09-20 ENCOUNTER — Other Ambulatory Visit (HOSPITAL_COMMUNITY): Payer: Self-pay

## 2017-09-20 ENCOUNTER — Telehealth (HOSPITAL_COMMUNITY): Payer: Self-pay | Admitting: *Deleted

## 2017-09-20 ENCOUNTER — Encounter (HOSPITAL_COMMUNITY): Payer: Self-pay

## 2017-09-20 DIAGNOSIS — I5022 Chronic systolic (congestive) heart failure: Secondary | ICD-10-CM

## 2017-09-20 NOTE — Progress Notes (Signed)
Paramedicine Encounter    Patient ID: [name], female    DOB: 1956-03-24, 62 y.o.   MRN: [id number]    Patient Care Team: Javier Docker, MD as PCP - General (Internal Medicine)  Patient Active Problem List   Diagnosis Date Noted  . Nausea & vomiting 07/01/2017  . Acute gout due to renal impairment involving left ankle   . Hypertensive heart and renal disease   . SOB (shortness of breath)   . Chronic diastolic heart failure ([place]) 11/30/2016  . CKD (chronic kidney disease) stage 3, GFR 30-59 ml/min (HCC) 11/30/2016  . Thrombocytopenia ([place]) 11/30/2016  . Sinus bradycardia 11/30/2016  . PMB (postmenopausal bleeding) 05/09/2010  . ANEMIA 05/05/2010  . DEPRESSION 04/26/2010  . TMJ SYNDROME 04/26/2010  . DEGENERATIVE DISC DISEASE, CERVICAL SPINE 04/26/2010  . CHEST PAIN 04/26/2010  . CHRONIC OBSTRUCTIVE PULMONARY DISEASE, MODERATE 03/26/2010  . MASTALGIA 10/08/2009  . Iron deficiency anemia 07/12/2009  . Diabetes mellitus ([place]) 07/02/2009  . Hyperlipidemia 07/02/2009  . BMI 45.0-49.9, adult ([place]) 07/02/2009  . OSA (obstructive sleep apnea) 07/02/2009  . Glaucoma 07/02/2009  . Hypertension 07/02/2009  . ASTHMA 07/02/2009  . LOW BACK PAIN SYNDROME 07/02/2009  . BACK PAIN 07/02/2009  . PERIPHERAL EDEMA 07/02/2009  . GROIN PAIN 07/02/2009  . TOBACCO USE, QUIT 07/02/2009  . HEMORRHOIDS, INTERNAL 07/16/2008  . DIVERTICULOSIS OF COLON 07/16/2008    Current Outpatient Medications:  .  aspirin EC 81 MG tablet, Take 81 mg by mouth daily with breakfast., Disp: , Rfl:  .  atorvastatin (LIPITOR) 10 MG tablet, Take 1 tablet (10 mg total) by mouth daily., Disp: 90 tablet, Rfl: 3 .  doxazosin (CARDURA) 4 MG tablet, Take 4 mg by mouth at bedtime., Disp: , Rfl:  .  insulin aspart (NOVOLOG FLEXPEN) 100 UNIT/ML FlexPen, Inject 3-20 Units into the skin 3 (three) times daily as needed for high blood sugar (CBG 150)., Disp: , Rfl:  .  insulin glargine (LANTUS) 100 unit/mL SOPN, Inject  50 Units into the skin daily before breakfast., Disp: , Rfl:  .  lisinopril (PRINIVIL,ZESTRIL) 5 MG tablet, Take 5 mg by mouth daily with breakfast. , Disp: , Rfl: 1 .  phentermine (ADIPEX-P) 37.5 MG tablet, Take 37.5 mg by mouth daily with breakfast. , Disp: , Rfl: 0 .  potassium chloride SA (K-DUR,KLOR-CON) 20 MEQ tablet, Take 1 tablet (20 mEq total) by mouth daily., Disp: 30 tablet, Rfl: 6 .  torsemide (DEMADEX) 20 MG tablet, Take 60 mg by mouth daily., Disp: , Rfl:  .  acetaminophen (TYLENOL) 500 MG tablet, Take 500-1,000 mg by mouth every 6 (six) hours as needed for mild pain or headache. , Disp: , Rfl:  .  albuterol (PROVENTIL HFA;VENTOLIN HFA) 108 (90 BASE) MCG/ACT inhaler, Inhale 2 puffs into the lungs every 6 (six) hours as needed for wheezing or shortness of breath. , Disp: , Rfl:  .  dorzolamide-timolol (COSOPT) 22.3-6.8 MG/ML ophthalmic solution, Place 1 drop into both eyes 2 (two) times daily., Disp: , Rfl:  .  HYDROcodone-acetaminophen (NORCO/VICODIN) 5-325 MG tablet, Take 1 tablet by mouth every 4 (four) hours as needed for moderate pain., Disp: 15 tablet, Rfl: 0 .  predniSONE (DELTASONE) 10 MG tablet, Prednisone 30 mg x 2 days then 20 mg x 2 days then 10 mg x 2 days then 5 mg x 2 days then stop, Disp: 13 tablet, Rfl: 0 .  PRESCRIPTION MEDICATION, Inhale into the lungs at bedtime. CPAP, Disp: , Rfl:  .  tiZANidine (  ZANAFLEX) 4 MG tablet, Take 4 mg by mouth every 12 (twelve) hours as needed for muscle spasms. Not to exceed 3 doses in 24 hours, Disp: , Rfl: 0 .  Travoprost, BAK Free, (TRAVATAN) 0.004 % SOLN ophthalmic solution, Place 1 drop into both eyes at bedtime., Disp: , Rfl:  .  Vitamin D, Ergocalciferol, (DRISDOL) 50000 units CAPS capsule, Take 1 capsule (50,000 Units total) by mouth every 7 (seven) days., Disp: 30 capsule, Rfl:  Allergies  Allergen Reactions  . Pork-Derived Products Nausea And Vomiting     Social History   Socioeconomic History  . Marital status: Single     Spouse name: Not on file  . Number of children: Not on file  . Years of education: Not on file  . Highest education level: Not on file  Social Needs  . Financial resource strain: Not on file  . Food insecurity - worry: Not on file  . Food insecurity - inability: Not on file  . Transportation needs - medical: Not on file  . Transportation needs - non-medical: Not on file  Occupational History  . Not on file  Tobacco Use  . Smoking status: Former Smoker    Types: Cigarettes    Last attempt to quit: 09/04/1998    Years since quitting: 19.0  . Smokeless tobacco: Never Used  Substance and Sexual Activity  . Alcohol use: Yes    Comment: occasional  . Drug use: No  . Sexual activity: Yes    Birth control/protection: None  Other Topics Concern  . Not on file  Social History Narrative  . Not on file    Physical Exam  Pulmonary/Chest: No respiratory distress.  Abdominal: She exhibits no distension. There is no tenderness. There is no guarding.  Musculoskeletal: She exhibits no edema.  Skin: Skin is warm and dry. She is not diaphoretic.        Future Appointments  Date Time Provider [place]  09/24/2017 11:00 AM [facility] None  10/01/2017  3:00 PM Tuchman, Leslye Peer, DPM [facility]  10/03/2017 10:00 AM Constant, Peggy, MD [facility] None    ATF pt CAO x4 sitting in her wheelchair watching tv. Pt has been taking an extra torsemide this week every evening since Tuesday.  Pt stated that she started taking them due to her feeling full in her abdomen.  Pt hasn't spoken to any physician about her med change.  She stated that she is going to take torsemide "like this until they tell her that she can't".  Pt was also upset that her next appointment was scheduled "so far out because she want them to check her kidney function more often".  I called the heart failure clinic and Jazmine scheduled pt an appointment for the lab next Monday at 37 (taxi will be to pick her  up around 10:30).  Pt denies sob, chest pain and dizziness.    cbg 148  BP (!) 122/56 (BP Location: Right Arm, Patient Position: Sitting, Cuff Size: Normal)   Pulse 82   Resp 16   Wt 254 lb (115.2 kg)   LMP 02/16/2017 (Within Weeks)   SpO2 100%   BMI 47.60 kg/m   Weight yesterday-258 Last visit weight-259  **rx called in: Atorvastatin (filled until tues) Pt is out of vit D (needs authorization) Phentermine (needs authorization) atorvastatin Colchicine (needs authorization) Potassium pt stated that she called in already [facility] Mekala Winger, EMT Paramedic 09/20/2017    ACTION: Home visit completed

## 2017-09-20 NOTE — Telephone Encounter (Signed)
[name] with paramedicine called to report that patient has been taking Torsemide 80mg  daily instead of 60mg  . Pt said 60mg  doesn't work and would like to continue to take 80mg  daily. Per Jonni Sanger get lab bmet and if labs are stable she can continue taking 80mg  daily.

## 2017-09-24 ENCOUNTER — Telehealth (HOSPITAL_COMMUNITY): Payer: Self-pay

## 2017-09-24 ENCOUNTER — Ambulatory Visit (HOSPITAL_COMMUNITY)
Admission: RE | Admit: 2017-09-24 | Discharge: 2017-09-24 | Disposition: A | Discharge status: Home / Self Care | Payer: Medicaid Other | Source: Ambulatory Visit | Attending: Internal Medicine | Admitting: Internal Medicine

## 2017-09-24 DIAGNOSIS — I5022 Chronic systolic (congestive) heart failure: Secondary | ICD-10-CM | POA: Diagnosis present

## 2017-09-24 LAB — BASIC METABOLIC PANEL
Anion gap: 8 (ref 5–15)
BUN: 59 mg/dL — AB (ref 6–20)
CALCIUM: 9.1 mg/dL (ref 8.9–10.3)
CO2: 26 mmol/L (ref 22–32)
CREATININE: 2.59 mg/dL — AB (ref 0.44–1.00)
Chloride: 106 mmol/L (ref 101–111)
GFR calc Af Amer: 22 mL/min — ABNORMAL LOW (ref >60)
GFR, EST NON AFRICAN AMERICAN: 19 mL/min — AB (ref >60)
Glucose, Bld: 186 mg/dL — ABNORMAL HIGH (ref 65–99)
POTASSIUM: 4.8 mmol/L (ref 3.5–5.1)
SODIUM: 140 mmol/L (ref 135–145)

## 2017-09-24 NOTE — Telephone Encounter (Signed)
Has been taking torsemide 80 mg daily.    Needs to hold x 2 days as long as weight stable, and go back to 60 mg daily. She was worried 60 mg wasn't working as well, but please re-assure that as long as weight/breathing are stable, she need a tremendous amount of urine output everyday, if that's what she was going on.    Needs follow up. 2 weeks with me is open.    Legrand Como [address]" [place], PA-C  09/24/2017 12:26 PM

## 2017-09-26 ENCOUNTER — Other Ambulatory Visit (HOSPITAL_COMMUNITY): Payer: Self-pay

## 2017-09-26 ENCOUNTER — Encounter (HOSPITAL_COMMUNITY): Payer: Self-pay

## 2017-09-26 NOTE — Progress Notes (Signed)
Paramedicine Encounter    Patient ID: [name], female    DOB: 07-Apr-1956, 62 y.o.   MRN: [id number]    Patient Care Team: Javier Docker, MD as PCP - General (Internal Medicine)  Patient Active Problem List   Diagnosis Date Noted  . Nausea & vomiting 07/01/2017  . Acute gout due to renal impairment involving left ankle   . Hypertensive heart and renal disease   . SOB (shortness of breath)   . Chronic diastolic heart failure ([place]) 11/30/2016  . CKD (chronic kidney disease) stage 3, GFR 30-59 ml/min (HCC) 11/30/2016  . Thrombocytopenia ([place]) 11/30/2016  . Sinus bradycardia 11/30/2016  . PMB (postmenopausal bleeding) 05/09/2010  . ANEMIA 05/05/2010  . DEPRESSION 04/26/2010  . TMJ SYNDROME 04/26/2010  . DEGENERATIVE DISC DISEASE, CERVICAL SPINE 04/26/2010  . CHEST PAIN 04/26/2010  . CHRONIC OBSTRUCTIVE PULMONARY DISEASE, MODERATE 03/26/2010  . MASTALGIA 10/08/2009  . Iron deficiency anemia 07/12/2009  . Diabetes mellitus ([place]) 07/02/2009  . Hyperlipidemia 07/02/2009  . BMI 45.0-49.9, adult ([place]) 07/02/2009  . OSA (obstructive sleep apnea) 07/02/2009  . Glaucoma 07/02/2009  . Hypertension 07/02/2009  . ASTHMA 07/02/2009  . LOW BACK PAIN SYNDROME 07/02/2009  . BACK PAIN 07/02/2009  . PERIPHERAL EDEMA 07/02/2009  . GROIN PAIN 07/02/2009  . TOBACCO USE, QUIT 07/02/2009  . HEMORRHOIDS, INTERNAL 07/16/2008  . DIVERTICULOSIS OF COLON 07/16/2008    Current Outpatient Medications:  .  aspirin EC 81 MG tablet, Take 81 mg by mouth daily with breakfast., Disp: , Rfl:  .  atorvastatin (LIPITOR) 10 MG tablet, Take 1 tablet (10 mg total) by mouth daily., Disp: 90 tablet, Rfl: 3 .  doxazosin (CARDURA) 4 MG tablet, Take 4 mg by mouth at bedtime., Disp: , Rfl:  .  insulin aspart (NOVOLOG FLEXPEN) 100 UNIT/ML FlexPen, Inject 3-20 Units into the skin 3 (three) times daily as needed for high blood sugar (CBG 150)., Disp: , Rfl:  .  insulin glargine (LANTUS) 100 unit/mL SOPN, Inject  50 Units into the skin daily before breakfast., Disp: , Rfl:  .  lisinopril (PRINIVIL,ZESTRIL) 5 MG tablet, Take 5 mg by mouth daily with breakfast. , Disp: , Rfl: 1 .  potassium chloride SA (K-DUR,KLOR-CON) 20 MEQ tablet, Take 1 tablet (20 mEq total) by mouth daily., Disp: 30 tablet, Rfl: 6 .  torsemide (DEMADEX) 20 MG tablet, Take 60 mg by mouth daily., Disp: , Rfl:  .  Vitamin D, Ergocalciferol, (DRISDOL) 50000 units CAPS capsule, Take 1 capsule (50,000 Units total) by mouth every 7 (seven) days., Disp: 30 capsule, Rfl:  .  acetaminophen (TYLENOL) 500 MG tablet, Take 500-1,000 mg by mouth every 6 (six) hours as needed for mild pain or headache. , Disp: , Rfl:  .  albuterol (PROVENTIL HFA;VENTOLIN HFA) 108 (90 BASE) MCG/ACT inhaler, Inhale 2 puffs into the lungs every 6 (six) hours as needed for wheezing or shortness of breath. , Disp: , Rfl:  .  dorzolamide-timolol (COSOPT) 22.3-6.8 MG/ML ophthalmic solution, Place 1 drop into both eyes 2 (two) times daily., Disp: , Rfl:  .  HYDROcodone-acetaminophen (NORCO/VICODIN) 5-325 MG tablet, Take 1 tablet by mouth every 4 (four) hours as needed for moderate pain., Disp: 15 tablet, Rfl: 0 .  phentermine (ADIPEX-P) 37.5 MG tablet, Take 37.5 mg by mouth daily with breakfast. , Disp: , Rfl: 0 .  predniSONE (DELTASONE) 10 MG tablet, Prednisone 30 mg x 2 days then 20 mg x 2 days then 10 mg x 2 days then 5 mg x  2 days then stop, Disp: 13 tablet, Rfl: 0 .  PRESCRIPTION MEDICATION, Inhale into the lungs at bedtime. CPAP, Disp: , Rfl:  .  tiZANidine (ZANAFLEX) 4 MG tablet, Take 4 mg by mouth every 12 (twelve) hours as needed for muscle spasms. Not to exceed 3 doses in 24 hours, Disp: , Rfl: 0 .  Travoprost, BAK Free, (TRAVATAN) 0.004 % SOLN ophthalmic solution, Place 1 drop into both eyes at bedtime., Disp: , Rfl:  Allergies  Allergen Reactions  . Pork-Derived Products Nausea And Vomiting     Social History   Socioeconomic History  . Marital status: Single     Spouse name: Not on file  . Number of children: Not on file  . Years of education: Not on file  . Highest education level: Not on file  Social Needs  . Financial resource strain: Not on file  . Food insecurity - worry: Not on file  . Food insecurity - inability: Not on file  . Transportation needs - medical: Not on file  . Transportation needs - non-medical: Not on file  Occupational History  . Not on file  Tobacco Use  . Smoking status: Former Smoker    Types: Cigarettes    Last attempt to quit: 09/04/1998    Years since quitting: 19.0  . Smokeless tobacco: Never Used  Substance and Sexual Activity  . Alcohol use: Yes    Comment: occasional  . Drug use: No  . Sexual activity: Yes    Birth control/protection: None  Other Topics Concern  . Not on file  Social History Narrative  . Not on file    Physical Exam  Pulmonary/Chest: No respiratory distress. She has no wheezes.  Abdominal: She exhibits no distension.  Musculoskeletal: She exhibits edema.  Both feet  Skin: Skin is warm and dry. She is not diaphoretic.        Future Appointments  Date Time Provider [place]  10/01/2017  2:45 PM Wallene Huh, DPM [facility]  10/03/2017 10:00 AM Constant, Vickii Chafe, MD [facility] None  10/09/2017 10:00 AM [facility] PA/NP [facility] None    ATF pt CAO x4 in the restroom, she just got home from running errands and attending doctors appointment.  Pt had lab work done Monday and she was advised to stop taking torsemide for 2 days and resume Thursday. Pt usually take the torsemide in the am and has currently started taking 80 mg daily without consulting with physician.  Pt was advised to resume the previous dose of 60mg  daily.  Pt denies sob, dizziness and chest pain.  Pt has taken all of her meds as prescribed.  rx bottles verified and pill box refilled.   BP (!) 142/60 (BP Location: Right Arm, Patient Position: Sitting, Cuff Size: Large)   Pulse 65   Resp 16   LMP  02/16/2017 (Within Weeks)   SpO2 98%   Last visit weight-254  cbg 32  Arliene Rosenow, EMT Paramedic 09/26/2017    ACTION: Home visit completed Next visit planned for next tuesday

## 2017-10-01 ENCOUNTER — Ambulatory Visit: Payer: Medicaid Other | Admitting: Podiatry

## 2017-10-01 ENCOUNTER — Encounter: Payer: Self-pay | Admitting: Podiatry

## 2017-10-01 DIAGNOSIS — M79609 Pain in unspecified limb: Secondary | ICD-10-CM

## 2017-10-01 DIAGNOSIS — E1151 Type 2 diabetes mellitus with diabetic peripheral angiopathy without gangrene: Secondary | ICD-10-CM

## 2017-10-01 DIAGNOSIS — B351 Tinea unguium: Secondary | ICD-10-CM

## 2017-10-01 DIAGNOSIS — E1142 Type 2 diabetes mellitus with diabetic polyneuropathy: Secondary | ICD-10-CM

## 2017-10-02 ENCOUNTER — Other Ambulatory Visit (HOSPITAL_COMMUNITY): Payer: Self-pay

## 2017-10-02 ENCOUNTER — Encounter (HOSPITAL_COMMUNITY): Payer: Self-pay

## 2017-10-02 NOTE — Progress Notes (Signed)
Paramedicine Encounter    Patient ID: [name], female    DOB: [birth year], 62 y.o.   MRN: [id number]    Patient Care Team: Javier Docker, MD as PCP - General (Internal Medicine)  Patient Active Problem List   Diagnosis Date Noted  . Nausea & vomiting 07/01/2017  . Acute gout due to renal impairment involving left ankle   . Hypertensive heart and renal disease   . SOB (shortness of breath)   . Chronic diastolic heart failure ([place]) 11/30/2016  . CKD (chronic kidney disease) stage 3, GFR 30-59 ml/min (HCC) 11/30/2016  . Thrombocytopenia ([place]) 11/30/2016  . Sinus bradycardia 11/30/2016  . PMB (postmenopausal bleeding) 05/09/2010  . ANEMIA 05/05/2010  . DEPRESSION 04/26/2010  . TMJ SYNDROME 04/26/2010  . DEGENERATIVE DISC DISEASE, CERVICAL SPINE 04/26/2010  . CHEST PAIN 04/26/2010  . CHRONIC OBSTRUCTIVE PULMONARY DISEASE, MODERATE 03/26/2010  . MASTALGIA 10/08/2009  . Iron deficiency anemia 07/12/2009  . Diabetes mellitus ([place]) 07/02/2009  . Hyperlipidemia 07/02/2009  . BMI 45.0-49.9, adult ([place]) 07/02/2009  . OSA (obstructive sleep apnea) 07/02/2009  . Glaucoma 07/02/2009  . Hypertension 07/02/2009  . ASTHMA 07/02/2009  . LOW BACK PAIN SYNDROME 07/02/2009  . BACK PAIN 07/02/2009  . PERIPHERAL EDEMA 07/02/2009  . GROIN PAIN 07/02/2009  . TOBACCO USE, QUIT 07/02/2009  . HEMORRHOIDS, INTERNAL 07/16/2008  . DIVERTICULOSIS OF COLON 07/16/2008    Current Outpatient Medications:  .  aspirin EC 81 MG tablet, Take 81 mg by mouth daily with breakfast., Disp: , Rfl:  .  atorvastatin (LIPITOR) 10 MG tablet, Take 1 tablet (10 mg total) by mouth daily., Disp: 90 tablet, Rfl: 3 .  doxazosin (CARDURA) 4 MG tablet, Take 4 mg by mouth at bedtime., Disp: , Rfl:  .  insulin aspart (NOVOLOG FLEXPEN) 100 UNIT/ML FlexPen, Inject 3-20 Units into the skin 3 (three) times daily as needed for high blood sugar (CBG 150)., Disp: , Rfl:  .  insulin glargine (LANTUS) 100 unit/mL SOPN, Inject  50 Units into the skin daily before breakfast., Disp: , Rfl:  .  lisinopril (PRINIVIL,ZESTRIL) 5 MG tablet, Take 5 mg by mouth daily with breakfast. , Disp: , Rfl: 1 .  phentermine (ADIPEX-P) 37.5 MG tablet, Take 37.5 mg by mouth daily with breakfast. , Disp: , Rfl: 0 .  potassium chloride SA (K-DUR,KLOR-CON) 20 MEQ tablet, Take 1 tablet (20 mEq total) by mouth daily., Disp: 30 tablet, Rfl: 6 .  torsemide (DEMADEX) 20 MG tablet, Take 60 mg by mouth daily., Disp: , Rfl:  .  Vitamin D, Ergocalciferol, (DRISDOL) 50000 units CAPS capsule, Take 1 capsule (50,000 Units total) by mouth every 7 (seven) days., Disp: 30 capsule, Rfl:  .  acetaminophen (TYLENOL) 500 MG tablet, Take 500-1,000 mg by mouth every 6 (six) hours as needed for mild pain or headache. , Disp: , Rfl:  .  albuterol (PROVENTIL HFA;VENTOLIN HFA) 108 (90 BASE) MCG/ACT inhaler, Inhale 2 puffs into the lungs every 6 (six) hours as needed for wheezing or shortness of breath. , Disp: , Rfl:  .  dorzolamide-timolol (COSOPT) 22.3-6.8 MG/ML ophthalmic solution, Place 1 drop into both eyes 2 (two) times daily., Disp: , Rfl:  .  HYDROcodone-acetaminophen (NORCO/VICODIN) 5-325 MG tablet, Take 1 tablet by mouth every 4 (four) hours as needed for moderate pain., Disp: 15 tablet, Rfl: 0 .  predniSONE (DELTASONE) 10 MG tablet, Prednisone 30 mg x 2 days then 20 mg x 2 days then 10 mg x 2 days then 5 mg x  2 days then stop, Disp: 13 tablet, Rfl: 0 .  PRESCRIPTION MEDICATION, Inhale into the lungs at bedtime. CPAP, Disp: , Rfl:  .  tiZANidine (ZANAFLEX) 4 MG tablet, Take 4 mg by mouth every 12 (twelve) hours as needed for muscle spasms. Not to exceed 3 doses in 24 hours, Disp: , Rfl: 0 .  Travoprost, BAK Free, (TRAVATAN) 0.004 % SOLN ophthalmic solution, Place 1 drop into both eyes at bedtime., Disp: , Rfl:  Allergies  Allergen Reactions  . Pork-Derived Products Nausea And Vomiting     Social History   Socioeconomic History  . Marital status: Single     Spouse name: Not on file  . Number of children: Not on file  . Years of education: Not on file  . Highest education level: Not on file  Social Needs  . Financial resource strain: Not on file  . Food insecurity - worry: Not on file  . Food insecurity - inability: Not on file  . Transportation needs - medical: Not on file  . Transportation needs - non-medical: Not on file  Occupational History  . Not on file  Tobacco Use  . Smoking status: Former Smoker    Types: Cigarettes    Last attempt to quit: 09/04/1998    Years since quitting: 19.0  . Smokeless tobacco: Never Used  Substance and Sexual Activity  . Alcohol use: Yes    Comment: occasional  . Drug use: No  . Sexual activity: Yes    Birth control/protection: None  Other Topics Concern  . Not on file  Social History Narrative  . Not on file    Physical Exam  Pulmonary/Chest: No respiratory distress. She has no wheezes.  Abdominal: She exhibits no distension. There is no tenderness.  Musculoskeletal: She exhibits no edema.  Skin: Skin is warm and dry. She is not diaphoretic.        Future Appointments  Date Time Provider [place]  10/03/2017 10:00 AM Constant, Vickii Chafe, MD [place] None  10/09/2017 10:00 AM [facility] PA/NP [facility] None  01/01/2018  2:15 PM Gardiner Barefoot, DPM [facility]    ATF pt CAO x4 sitting in her wheelchair playing on her phone.  Pt still have help with home health aide coming over to help with her bathing and cooking.  Pt stated that she has an appointment tomorrow with ob gyn and pain clinic.  Pt had eaten salad today and we discussed her reading the labels to the foods that she eats.  She has taken all of her medications besides potassium.  I asked her if she has been taking them directly from the bottle because the pill box was full of potassium. She stated that she cant remember.  Pt has no other complaints besides back pain, which she has chronic hx of and receive treatment from  the pain clinic.  rx bottles verified and pill box refilled.    BP 140/64 (BP Location: Left Arm, Patient Position: Sitting)   Pulse 78   Resp 16   Wt 258 lb (117 kg)   LMP 02/16/2017 (Within Weeks)   SpO2 98%   BMI 48.35 kg/m   cbg 172  **rx called in: [place] Macky Galik, EMT Paramedic 10/02/2017    ACTION: Home visit completed Next visit planned for next wednesday

## 2017-10-03 ENCOUNTER — Encounter: Payer: Self-pay | Admitting: Obstetrics and Gynecology

## 2017-10-03 ENCOUNTER — Ambulatory Visit (INDEPENDENT_AMBULATORY_CARE_PROVIDER_SITE_OTHER): Payer: Medicaid Other | Admitting: Obstetrics and Gynecology

## 2017-10-03 VITALS — BP 128/70 | HR 61 | Ht 61.5 in | Wt 265.0 lb

## 2017-10-03 DIAGNOSIS — N950 Postmenopausal bleeding: Secondary | ICD-10-CM

## 2017-10-03 MED ORDER — LEUPROLIDE ACETATE (3 MONTH) 11.25 MG IM KIT
11.2500 mg | PACK | Freq: Once | INTRAMUSCULAR | Status: AC
  Administered 2018-01-23: 11.25 mg via INTRAMUSCULAR

## 2017-10-03 MED ORDER — LEUPROLIDE ACETATE (3 MONTH) 11.25 MG IM KIT: 11.2500 mg | PACK | INTRAMUSCULAR | 0 refills | Status: DC

## 2017-10-03 NOTE — Progress Notes (Signed)
62 yo P1 here to discuss fibroid uterus. Patient seen in August 2018 due to postmenopausal vaginal bleeding. Endometrial biopsy was performed and found to be negative for hyperplasia or malignancy. Patient states that she has not experienced any vaginal bleeding since August. She denies any pelvic pain or abnormal discharge. Patient states that she was seen by her PCP and diagnosed with anemia. She has concerns that her fibroid uterus is the source.  Past Medical History:  Diagnosis Date  . Achilles rupture   . Anxiety   . Arthritis   . Asthma   . Chronic back pain   . Chronic headaches   . Chronic leg pain    bilaterally  . Depression   . Diabetes mellitus 1999   T2DM  . Glaucoma   . H/O gestational diabetes mellitus, not currently pregnant   . Hyperlipidemia   . Hypertension   . Morbid obesity ([place])   . OSA (obstructive sleep apnea)   . Sciatica    Family History  Problem Relation Age of Onset  . Heart failure Mother   . Renal Disease Mother    Social History   Tobacco Use  . Smoking status: Former Smoker    Types: Cigarettes    Last attempt to quit: 09/04/1998    Years since quitting: 19.0  . Smokeless tobacco: Never Used  Substance Use Topics  . Alcohol use: Yes    Comment: occasional  . Drug use: No   ROS See pertinent in HPI Blood pressure 128/70, pulse 61, height 5' 1.5" (1.562 m), weight 265 lb (120.2 kg), last menstrual period 02/16/2017. GENERAL: Well-developed, well-nourished female in no acute distress.  Neuro: alert and oriented x 3  FINDINGS: Uterus  Measurements: 12.1 x 7.2 x 6.6 cm. Anteflexed. Large exophytic mass at uterine fundus posteriorly 7.9 x 6.4 x 8.6 cm likely representing a large leiomyoma.  Endometrium  Thickness: Suboptimally visualized, 15 mm thick, abnormal. No endometrial fluid or focal abnormality grossly identified  Right ovary  Not visualized likely secondary to bowel gas and body habitus  Left ovary  Not  visualized likely secondary to bowel gas and body habitus  Other findings  No free pelvic fluid or adnexal masses.  IMPRESSION: Large probable exophytic leiomyoma at uterine fundus 8.6 cm greatest size.  Suboptimally visualized with thickened endometrial complex 15 mm thick, recommendation below.  In the setting of post-menopausal bleeding, endometrial sampling is indicated to exclude carcinoma. If results are benign, sonohysterogram should be considered for focal lesion work-up. (Ref: Radiological Reasoning: Algorithmic Workup of Abnormal Vaginal Bleeding with Endovaginal Sonography and Sonohysterography. AJR 2008; 322:G25-42)   Electronically Signed   By: Lavonia Dana M.D.   On: 04/27/2017 14:31  A/P 62 yo P1 with recent h/o postmenopausal bleeding and fibroid uterus here for follow up - Discussed that surgical management is not an option at this time - Patient without vaginal bleeding for the past 5 months - Discussed medical management with depo-lupron followed by megace as prophylaxis as she fears this is the source of her anemia. Patient agrees to receiving depo-lupron and will return when it is available - Discussed to investigate anemia with colonoscopy through PCP - RTC prn

## 2017-10-03 NOTE — Progress Notes (Signed)
Patient is in the office to discuss fibroids.

## 2017-10-09 ENCOUNTER — Ambulatory Visit (HOSPITAL_COMMUNITY)
Admission: RE | Admit: 2017-10-09 | Discharge: 2017-10-09 | Disposition: A | Discharge status: Home / Self Care | Payer: Medicaid Other | Source: Ambulatory Visit | Attending: Cardiology | Admitting: Cardiology

## 2017-10-09 ENCOUNTER — Telehealth (HOSPITAL_COMMUNITY): Payer: Self-pay | Admitting: Cardiology

## 2017-10-09 ENCOUNTER — Encounter (HOSPITAL_COMMUNITY): Payer: Self-pay

## 2017-10-09 VITALS — BP 128/62 | HR 70 | Wt 260.8 lb

## 2017-10-09 DIAGNOSIS — Z6841 Body Mass Index (BMI) 40.0 and over, adult: Secondary | ICD-10-CM | POA: Insufficient documentation

## 2017-10-09 DIAGNOSIS — F329 Major depressive disorder, single episode, unspecified: Secondary | ICD-10-CM | POA: Insufficient documentation

## 2017-10-09 DIAGNOSIS — G8929 Other chronic pain: Secondary | ICD-10-CM | POA: Insufficient documentation

## 2017-10-09 DIAGNOSIS — H409 Unspecified glaucoma: Secondary | ICD-10-CM | POA: Insufficient documentation

## 2017-10-09 DIAGNOSIS — R001 Bradycardia, unspecified: Secondary | ICD-10-CM | POA: Insufficient documentation

## 2017-10-09 DIAGNOSIS — J45909 Unspecified asthma, uncomplicated: Secondary | ICD-10-CM | POA: Insufficient documentation

## 2017-10-09 DIAGNOSIS — E785 Hyperlipidemia, unspecified: Secondary | ICD-10-CM | POA: Diagnosis not present

## 2017-10-09 DIAGNOSIS — Z87891 Personal history of nicotine dependence: Secondary | ICD-10-CM | POA: Diagnosis not present

## 2017-10-09 DIAGNOSIS — I5032 Chronic diastolic (congestive) heart failure: Secondary | ICD-10-CM | POA: Diagnosis present

## 2017-10-09 DIAGNOSIS — Z79899 Other long term (current) drug therapy: Secondary | ICD-10-CM | POA: Diagnosis not present

## 2017-10-09 DIAGNOSIS — Z794 Long term (current) use of insulin: Secondary | ICD-10-CM | POA: Insufficient documentation

## 2017-10-09 DIAGNOSIS — M549 Dorsalgia, unspecified: Secondary | ICD-10-CM | POA: Insufficient documentation

## 2017-10-09 DIAGNOSIS — E1122 Type 2 diabetes mellitus with diabetic chronic kidney disease: Secondary | ICD-10-CM | POA: Diagnosis not present

## 2017-10-09 DIAGNOSIS — Z7982 Long term (current) use of aspirin: Secondary | ICD-10-CM | POA: Insufficient documentation

## 2017-10-09 DIAGNOSIS — N183 Chronic kidney disease, stage 3 unspecified: Secondary | ICD-10-CM

## 2017-10-09 DIAGNOSIS — F419 Anxiety disorder, unspecified: Secondary | ICD-10-CM | POA: Diagnosis not present

## 2017-10-09 DIAGNOSIS — E1165 Type 2 diabetes mellitus with hyperglycemia: Secondary | ICD-10-CM | POA: Diagnosis not present

## 2017-10-09 DIAGNOSIS — N184 Chronic kidney disease, stage 4 (severe): Secondary | ICD-10-CM | POA: Insufficient documentation

## 2017-10-09 DIAGNOSIS — G4733 Obstructive sleep apnea (adult) (pediatric): Secondary | ICD-10-CM

## 2017-10-09 DIAGNOSIS — Z9119 Patient's noncompliance with other medical treatment and regimen: Secondary | ICD-10-CM | POA: Diagnosis not present

## 2017-10-09 DIAGNOSIS — I130 Hypertensive heart and chronic kidney disease with heart failure and stage 1 through stage 4 chronic kidney disease, or unspecified chronic kidney disease: Secondary | ICD-10-CM | POA: Insufficient documentation

## 2017-10-09 DIAGNOSIS — I10 Essential (primary) hypertension: Secondary | ICD-10-CM

## 2017-10-09 DIAGNOSIS — Z0189 Encounter for other specified special examinations: Secondary | ICD-10-CM | POA: Insufficient documentation

## 2017-10-09 DIAGNOSIS — M109 Gout, unspecified: Secondary | ICD-10-CM | POA: Insufficient documentation

## 2017-10-09 LAB — BASIC METABOLIC PANEL
ANION GAP: 13 (ref 5–15)
BUN: 56 mg/dL — ABNORMAL HIGH (ref 6–20)
CALCIUM: 9.2 mg/dL (ref 8.9–10.3)
CO2: 22 mmol/L (ref 22–32)
CREATININE: 2.35 mg/dL — AB (ref 0.44–1.00)
Chloride: 106 mmol/L (ref 101–111)
GFR, EST AFRICAN AMERICAN: 25 mL/min — AB (ref >60)
GFR, EST NON AFRICAN AMERICAN: 21 mL/min — AB (ref >60)
Glucose, Bld: 156 mg/dL — ABNORMAL HIGH (ref 65–99)
Potassium: 5.3 mmol/L — ABNORMAL HIGH (ref 3.5–5.1)
SODIUM: 141 mmol/L (ref 135–145)

## 2017-10-09 MED ORDER — METOLAZONE 2.5 MG PO TABS: 2.5000 mg | ORAL_TABLET | ORAL | 0 refills | Status: DC

## 2017-10-09 NOTE — Patient Instructions (Addendum)
INCREASE Torsemide to 60 mg (3 tabs) TWICE daily for TWO DAYS. Then reduce back to normal daily dose of 60 mg (3 tabs) ONCE daily thereafter.  Take metolazone once tomorrow ONLY. You will have extra tablets leftover.  You MUST call CHF clinic first before ever taking these.  Routine lab work today. Will notify you of abnormal results, otherwise no news is good news!  Will refer you to the [facility]. Address: [id number], [place], [place] [address] Phone: [phone]  Will refer you to pulmonary rehab at [facility]. They will call you to set up initial appointment.  Follow up 2 weeks with Oda Kilts PA-C.  Take all medication as prescribed the day of your appointment. Bring all medications with you to your appointment.  Do the following things EVERYDAY: 1) Weigh yourself in the morning before breakfast. Write it down and keep it in a log. 2) Take your medicines as prescribed 3) Eat low salt foods-Limit salt (sodium) to 2000 mg per day.  4) Stay as active as you can everyday 5) Limit all fluids for the day to less than 2 liters

## 2017-10-09 NOTE — Progress Notes (Signed)
Advanced Heart Failure Clinic Note   Referring Physician: Dr. Johnsie Cancel Primary Care: [place], [place]  Primary Cardiologist: Dr. Johnsie Cancel HF: Dr. Aundra Dubin  HPI: [name] is a 62 y.o. female with PMH Chronic diastolic CHF, OSA, Morbid obesity, DM2, HTN, CKD, and sinus bradycardia.   Admitted 11/2016 with CHF. Echo and RHC as below. Normal LVEF with grade 2 DD. RHC with normal pressures s/p diuresis. Discharge weight 246 lbs.   Seen by Dr. Johnsie Cancel 01/18/2017. Switched from lasix 40 mg daily to torsemide 20 mg BID with bloating and weight gain. Pt weight at that visit 252 lbs ( 6 lbs over 2 months)  Social: On disability. Had back surgery 08/2014 and has ongoing chronic back pain. Lives at home with roommate. Daughter lives in [place].   Returns today for HF follow up. Weight down 5 pounds from last visit after she was instructed to take metolazone and torsemide was increased for a few days. SOB with walking around the house, but says this is due to her back. Recently hospitalized for gout. Does not get very much activity at all. She has not taken her torsemide yet today, but has taken other medications. Paramedicine continues to follow, reports intermittent compliance with medications and diet.   Echo 12/02/16 LVEF 60-65%, Grade 2 DD, Trivial TR, Trivial PI. Normal RV.   [place] 12/06/16: Hemodynamics (mmHg) RA mean 6 RV 48/7 PA 45/14 (mean 23) PCWP 8 PVR 2.7 WU AO 99% Cardiac Output (Fick) 5.6 Cardiac Index (Fick) 2.72  Past Medical History:  Diagnosis Date  . Achilles rupture   . Anxiety   . Arthritis   . Asthma   . Chronic back pain   . Chronic headaches   . Chronic leg pain    bilaterally  . Depression   . Diabetes mellitus 1999   T2DM  . Glaucoma   . H/O gestational diabetes mellitus, not currently pregnant   . Hyperlipidemia   . Hypertension   . Morbid obesity ([place])   . OSA (obstructive sleep apnea)   . Sciatica     Current Outpatient Medications  Medication Sig  Dispense Refill  . acetaminophen (TYLENOL) 500 MG tablet Take 500-1,000 mg by mouth every 6 (six) hours as needed for mild pain or headache.     . albuterol (PROVENTIL HFA;VENTOLIN HFA) 108 (90 BASE) MCG/ACT inhaler Inhale 2 puffs into the lungs every 6 (six) hours as needed for wheezing or shortness of breath.     Marland Kitchen aspirin EC 81 MG tablet Take 81 mg by mouth daily with breakfast.    . atorvastatin (LIPITOR) 10 MG tablet Take 1 tablet (10 mg total) by mouth daily. 90 tablet 3  . colchicine 0.6 MG tablet Take 0.6 mg by mouth daily.    . dorzolamide-timolol (COSOPT) 22.3-6.8 MG/ML ophthalmic solution Place 1 drop into both eyes 2 (two) times daily.    Marland Kitchen doxazosin (CARDURA) 4 MG tablet Take 4 mg by mouth at bedtime.    Marland Kitchen HYDROcodone-acetaminophen (NORCO/VICODIN) 5-325 MG tablet Take 1 tablet by mouth every 4 (four) hours as needed for moderate pain. 15 tablet 0  . insulin aspart (NOVOLOG FLEXPEN) 100 UNIT/ML FlexPen Inject 3-20 Units into the skin 3 (three) times daily as needed for high blood sugar (CBG 150).    . insulin glargine (LANTUS) 100 unit/mL SOPN Inject 50 Units into the skin daily before breakfast.    . leuprolide (LUPRON DEPOT, 42-MONTH,) 11.25 MG injection Inject 11.25 mg into the muscle every 3 (three) months. 1  each 0  . lisinopril (PRINIVIL,ZESTRIL) 5 MG tablet Take 5 mg by mouth daily with breakfast.   1  . phentermine (ADIPEX-P) 37.5 MG tablet Take 37.5 mg by mouth daily with breakfast.   0  . potassium chloride SA (K-DUR,KLOR-CON) 20 MEQ tablet Take 1 tablet (20 mEq total) by mouth daily. 30 tablet 6  . PRESCRIPTION MEDICATION Inhale into the lungs at bedtime. CPAP    . tiZANidine (ZANAFLEX) 4 MG tablet Take 4 mg by mouth every 12 (twelve) hours as needed for muscle spasms. Not to exceed 3 doses in 24 hours  0  . torsemide (DEMADEX) 20 MG tablet Take 60 mg by mouth daily.    . Travoprost, BAK Free, (TRAVATAN) 0.004 % SOLN ophthalmic solution Place 1 drop into both eyes at bedtime.     . Vitamin D, Ergocalciferol, (DRISDOL) 50000 units CAPS capsule Take 1 capsule (50,000 Units total) by mouth every 7 (seven) days. 30 capsule   . predniSONE (DELTASONE) 10 MG tablet Prednisone 30 mg x 2 days then 20 mg x 2 days then 10 mg x 2 days then 5 mg x 2 days then stop (Patient not taking: Reported on 10/03/2017) 13 tablet 0   Current Facility-Administered Medications  Medication Dose Route Frequency Provider Last Rate Last Dose  . leuprolide (LUPRON) injection 11.25 mg  11.25 mg Intramuscular Once Constant, Peggy, MD        Allergies  Allergen Reactions  . Pork-Derived Products Nausea And Vomiting     Social History   Socioeconomic History  . Marital status: Single    Spouse name: Not on file  . Number of children: Not on file  . Years of education: Not on file  . Highest education level: Not on file  Social Needs  . Financial resource strain: Not on file  . Food insecurity - worry: Not on file  . Food insecurity - inability: Not on file  . Transportation needs - medical: Not on file  . Transportation needs - non-medical: Not on file  Occupational History  . Not on file  Tobacco Use  . Smoking status: Former Smoker    Types: Cigarettes    Last attempt to quit: 09/04/1998    Years since quitting: 19.1  . Smokeless tobacco: Never Used  Substance and Sexual Activity  . Alcohol use: Yes    Comment: occasional  . Drug use: No  . Sexual activity: Yes    Birth control/protection: None  Other Topics Concern  . Not on file  Social History Narrative  . Not on file      Family History  Problem Relation Age of Onset  . Heart failure Mother   . Renal Disease Mother     Vitals:   10/09/17 1014  BP: 128/62  Pulse: 70  SpO2: 100%  Weight: 260 lb 12.8 oz (118.3 kg)     Wt Readings from Last 3 Encounters:  10/09/17 260 lb 12.8 oz (118.3 kg)  10/03/17 265 lb (120.2 kg)  10/02/17 258 lb (117 kg)    PHYSICAL EXAM: General: Well appearing, arrived in  wheelchair. No resp difficulty. HEENT: Normal Neck: Supple. JVP 8-9 cm. Carotids 2+ bilat; no bruits. No thyromegaly or nodule noted. Cor: PMI nondisplaced. RRR, No M/G/R noted Lungs: CTAB, normal effort. Abdomen: Obese, soft, non-tender, non-distended, no HSM. No bruits or masses. +BS  Extremities: No cyanosis, clubbing, or rash.1+ edema to knees.  Neuro: Alert & orientedx3, cranial nerves grossly intact. moves all 4 extremities w/o  difficulty. Affect pleasant  ASSESSMENT & PLAN:  1. Chronic diastolic CHF: Echo 05/210 EF 60-65%, Grade 2 DD, trivial TR, normal RV.  - NYHA III-IIIb.  - Volume elevated on exam.  - Take torsemide 60 mg BID x 2 days. Then return to 60 mg daily - Take 2.5 mg metolazone x 1.  - We have tried adding Arlyce Harman and creatinine went from 1.6 to 2.18. So Arlyce Harman stopped.  - Reinforced fluid restriction to < 2 L daily, sodium restriction to less than 2000 mg daily, and the importance of daily weights.   - Refer to pulmonary rehab.   2. CKD Stage III - IV: baseline creatinine 1.6-1.8 - BMET today.   - Sees renal next month.   3. Morbid obesity - Body mass index is 48.48 kg/m. - Encouraged her to limit portions and increase exercise.   4. HTN - Well controlled on current regimen.   5. Sinus bradycardia - Stable. Not on beta blocker.   6. OSA - Non compliant with CPAP. Encouraged nightly use.   7. DM2 - Per PCP - Will refer to nutrition.   RTC 2 weeks to reassess. Volume overloaded on exam. Increase diuretics as above. Will need to follow closely. She establishes with Dr. Arty Baumgartner next month.   Shirley Friar, PA-C 10/09/17   Greater than 50% of the 25 minute visit was spent in counseling/coordination of care regarding disease state education, salt/fluid restriction, sliding scale diuretics, and medication compliance.

## 2017-10-09 NOTE — Telephone Encounter (Signed)
-----   Message from Shirley Friar, PA-C sent at 10/09/2017 11:24 AM EST ----- Stop K. Recheck 1 week.    Legrand Como [address] [place], PA-C 10/09/2017 11:24 AM

## 2017-10-09 NOTE — Progress Notes (Signed)
Heart Failure Clinic Social Work Assessment  [name]  presents today in association with an [facility] Appointment.  Patient seen today by CSW for follow up/assistance on Health problems and/or  supportive intervention.   Social Determinants impacting successful heart failure regimen:  Housing: patient lives alone Food: Do you have enough food? Yes  Do you know and understand healthy eating and how that affects your heart failure diagnosis? Patient requesting referral to nutritionist to have better understanding of diet due to multiple comorbidities.  Do you follow a low salt diet?  Yes  Utilities: Do you have gas and/or electricity on in your home? Yes  Income: What is your source of income? SSI Insurance: medicaid Transportation: Do you have transportation to your medical appointments?Yes  If yes, how? Medicaid/[employer]AT    Daily Health Needs: Do you have a scale and weigh each day?  Yes  Do you have your medications? Yes  Are you able to adhere to your medication regimen? Yes   Do you ever take medications differently than prescribed? No  Do you know the zones of Heart Failure?  Yes  Do you know how to contact the HF Clinic appropriately with worsening symptoms or weight increases?  Yes    Do you have an Advanced Directive? Did not discuss on this visit. If not, would you like to complete?   Do you have any identified obstacles / challenges for adherence to current treatment plan?No    Patient appears frustrated with slow progress to improve her health.   CSW assisted patient with Supportive Counseling, Referral to Dietician and refrreal to pulmonary rehab. with the goal to Increase healthy adjustment to current life circumstances and improved health outcomes.    Heart Failure Clinic Social Worker will continue to coordinate and monitor patient's treatment plan as needed.  CSW continues to be available for identified needs.Raquel Sarna, [place], [place]

## 2017-10-09 NOTE — Telephone Encounter (Signed)
Repeat labs 1/30, patient aware

## 2017-10-10 ENCOUNTER — Encounter (HOSPITAL_COMMUNITY): Payer: Self-pay

## 2017-10-10 ENCOUNTER — Other Ambulatory Visit (HOSPITAL_COMMUNITY): Payer: Self-pay

## 2017-10-10 NOTE — Progress Notes (Signed)
Paramedicine Encounter    Patient ID: [name], female    DOB: [birth year], 62 y.o.   MRN: [id number]    Patient Care Team: Javier Docker, MD as PCP - General (Internal Medicine)  Patient Active Problem List   Diagnosis Date Noted  . Nausea & vomiting 07/01/2017  . Acute gout due to renal impairment involving left ankle   . Hypertensive heart and renal disease   . SOB (shortness of breath)   . Chronic diastolic heart failure ([place]) 11/30/2016  . CKD (chronic kidney disease) stage 3, GFR 30-59 ml/min ([facility]) 11/30/2016  . Thrombocytopenia ([place]) 11/30/2016  . Sinus bradycardia 11/30/2016  . PMB (postmenopausal bleeding) 05/09/2010  . ANEMIA 05/05/2010  . DEPRESSION 04/26/2010  . TMJ SYNDROME 04/26/2010  . DEGENERATIVE DISC DISEASE, CERVICAL SPINE 04/26/2010  . CHEST PAIN 04/26/2010  . CHRONIC OBSTRUCTIVE PULMONARY DISEASE, MODERATE 03/26/2010  . MASTALGIA 10/08/2009  . Iron deficiency anemia 07/12/2009  . Diabetes mellitus ([place]) 07/02/2009  . Hyperlipidemia 07/02/2009  . BMI 45.0-49.9, adult ([place]) 07/02/2009  . OSA (obstructive sleep apnea) 07/02/2009  . Glaucoma 07/02/2009  . Hypertension 07/02/2009  . ASTHMA 07/02/2009  . LOW BACK PAIN SYNDROME 07/02/2009  . BACK PAIN 07/02/2009  . PERIPHERAL EDEMA 07/02/2009  . GROIN PAIN 07/02/2009  . TOBACCO USE, QUIT 07/02/2009  . HEMORRHOIDS, INTERNAL 07/16/2008  . DIVERTICULOSIS OF COLON 07/16/2008    Current Outpatient Medications:  .  aspirin EC 81 MG tablet, Take 81 mg by mouth daily with breakfast., Disp: , Rfl:  .  atorvastatin (LIPITOR) 10 MG tablet, Take 1 tablet (10 mg total) by mouth daily., Disp: 90 tablet, Rfl: 3 .  colchicine 0.6 MG tablet, Take 0.6 mg by mouth daily., Disp: , Rfl:  .  doxazosin (CARDURA) 4 MG tablet, Take 4 mg by mouth at bedtime., Disp: , Rfl:  .  insulin aspart (NOVOLOG FLEXPEN) 100 UNIT/ML FlexPen, Inject 3-20 Units into the skin 3 (three) times daily as needed for high blood sugar (CBG  150)., Disp: , Rfl:  .  insulin glargine (LANTUS) 100 unit/mL SOPN, Inject 50 Units into the skin daily before breakfast., Disp: , Rfl:  .  lisinopril (PRINIVIL,ZESTRIL) 5 MG tablet, Take 5 mg by mouth daily with breakfast. , Disp: , Rfl: 1 .  phentermine (ADIPEX-P) 37.5 MG tablet, Take 37.5 mg by mouth daily with breakfast. , Disp: , Rfl: 0 .  torsemide (DEMADEX) 20 MG tablet, Take 60 mg by mouth daily., Disp: , Rfl:  .  acetaminophen (TYLENOL) 500 MG tablet, Take 500-1,000 mg by mouth every 6 (six) hours as needed for mild pain or headache. , Disp: , Rfl:  .  albuterol (PROVENTIL HFA;VENTOLIN HFA) 108 (90 BASE) MCG/ACT inhaler, Inhale 2 puffs into the lungs every 6 (six) hours as needed for wheezing or shortness of breath. , Disp: , Rfl:  .  dorzolamide-timolol (COSOPT) 22.3-6.8 MG/ML ophthalmic solution, Place 1 drop into both eyes 2 (two) times daily., Disp: , Rfl:  .  HYDROcodone-acetaminophen (NORCO/VICODIN) 5-325 MG tablet, Take 1 tablet by mouth every 4 (four) hours as needed for moderate pain., Disp: 15 tablet, Rfl: 0 .  leuprolide (LUPRON DEPOT, 74-MONTH,) 11.25 MG injection, Inject 11.25 mg into the muscle every 3 (three) months., Disp: 1 each, Rfl: 0 .  metolazone (ZAROXOLYN) 2.5 MG tablet, Take 1 tablet (2.5 mg total) by mouth as directed. Must call CHF clinic before taking! [phone])., Disp: 5 tablet, Rfl: 0 .  predniSONE (DELTASONE) 10 MG tablet, Prednisone 30 mg  x 2 days then 20 mg x 2 days then 10 mg x 2 days then 5 mg x 2 days then stop (Patient not taking: Reported on 10/03/2017), Disp: 13 tablet, Rfl: 0 .  PRESCRIPTION MEDICATION, Inhale into the lungs at bedtime. CPAP, Disp: , Rfl:  .  tiZANidine (ZANAFLEX) 4 MG tablet, Take 4 mg by mouth every 12 (twelve) hours as needed for muscle spasms. Not to exceed 3 doses in 24 hours, Disp: , Rfl: 0 .  Travoprost, BAK Free, (TRAVATAN) 0.004 % SOLN ophthalmic solution, Place 1 drop into both eyes at bedtime., Disp: , Rfl:  .  Vitamin D,  Ergocalciferol, (DRISDOL) 50000 units CAPS capsule, Take 1 capsule (50,000 Units total) by mouth every 7 (seven) days., Disp: 30 capsule, Rfl:   Current Facility-Administered Medications:  .  leuprolide (LUPRON) injection 11.25 mg, 11.25 mg, Intramuscular, Once, Constant, Peggy, MD Allergies  Allergen Reactions  . Pork-Derived Products Nausea And Vomiting     Social History   Socioeconomic History  . Marital status: Single    Spouse name: Not on file  . Number of children: Not on file  . Years of education: Not on file  . Highest education level: Not on file  Social Needs  . Financial resource strain: Not on file  . Food insecurity - worry: Not on file  . Food insecurity - inability: Not on file  . Transportation needs - medical: Not on file  . Transportation needs - non-medical: Not on file  Occupational History  . Not on file  Tobacco Use  . Smoking status: Former Smoker    Types: Cigarettes    Last attempt to quit: 09/04/1998    Years since quitting: 19.1  . Smokeless tobacco: Never Used  Substance and Sexual Activity  . Alcohol use: Yes    Comment: occasional  . Drug use: No  . Sexual activity: Yes    Birth control/protection: None  Other Topics Concern  . Not on file  Social History Narrative  . Not on file    Physical Exam  Pulmonary/Chest: No respiratory distress. She has no wheezes.  Abdominal: She exhibits no distension. There is no tenderness.  Musculoskeletal: She exhibits edema.  Both feet and ankles  Skin: Skin is warm and dry. She is not diaphoretic.        Future Appointments  Date Time Provider [place]  10/17/2017 10:00 AM [facility] LAB [facility] None  10/22/2017  1:30 PM [facility] PA/NP [facility] None  10/23/2017  3:30 PM Anne Shutter, RD [place] [facility]  01/01/2018  2:15 PM Gardiner Barefoot, DPM [facility]    ATF pt CAO x4 in the restroom with her homeaide getting washed and dressed.  Pt stated she was told to take a metalazone  yesterday after she left the heart failure clinic. She stated that she just ook one this am with the torsemide.  Pt stated that cant tell the difference yet; she denies sob, dizziness and chest pain.  She stated that she was advised to do more physical activity and eat better. She stated that she is not drinking too much fluid and she thinks that she isn't drinking enough.  I advised her to continue to watch her fluid and sodium intake daily. Pt has taken all of her medications this week.  Her pill box filled according to her last appointment notes yesterday.    BP 118/68 (BP Location: Left Arm, Patient Position: Sitting, Cuff Size: Normal)   Pulse 71  Resp 16   Wt 248 lb (112.5 kg)   LMP 02/16/2017 (Within Weeks)   SpO2 97%   BMI 46.10 kg/m   Weight yesterday-weighed at the clinic "with her clothes on" Last visit weight-250  **rx called in: Lisinopril Torsemide (filled until mon/none on tues)  Aleck Locklin, EMT Paramedic 10/11/2017    ACTION: Home visit completed Next visit planned for next week

## 2017-10-11 ENCOUNTER — Telehealth (HOSPITAL_COMMUNITY): Payer: Self-pay

## 2017-10-11 NOTE — Telephone Encounter (Signed)
Called to speak with patient in regards to Pulmonary Rehab Maintenance - Patient cannot afford program right now. Closed referral.

## 2017-10-15 ENCOUNTER — Telehealth: Payer: Self-pay | Admitting: Pediatrics

## 2017-10-15 NOTE — Telephone Encounter (Signed)
Pharmacy called in regards to pt's Lupron order.  They are requesting dx code.  OK to use post men bleeding? Please advise.

## 2017-10-16 ENCOUNTER — Other Ambulatory Visit (HOSPITAL_COMMUNITY): Payer: Self-pay

## 2017-10-16 ENCOUNTER — Encounter (HOSPITAL_COMMUNITY): Payer: Self-pay

## 2017-10-16 NOTE — Progress Notes (Signed)
Paramedicine Encounter    Patient ID: [name], female    DOB: Jan 20, 1956, 62 y.o.   MRN: [id number]    Patient Care Team: Javier Docker, MD as PCP - General (Internal Medicine)  Patient Active Problem List   Diagnosis Date Noted  . Nausea & vomiting 07/01/2017  . Acute gout due to renal impairment involving left ankle   . Hypertensive heart and renal disease   . SOB (shortness of breath)   . Chronic diastolic heart failure ([place]) 11/30/2016  . CKD (chronic kidney disease) stage 3, GFR 30-59 ml/min (HCC) 11/30/2016  . Thrombocytopenia ([place]) 11/30/2016  . Sinus bradycardia 11/30/2016  . PMB (postmenopausal bleeding) 05/09/2010  . ANEMIA 05/05/2010  . DEPRESSION 04/26/2010  . TMJ SYNDROME 04/26/2010  . DEGENERATIVE DISC DISEASE, CERVICAL SPINE 04/26/2010  . CHEST PAIN 04/26/2010  . CHRONIC OBSTRUCTIVE PULMONARY DISEASE, MODERATE 03/26/2010  . MASTALGIA 10/08/2009  . Iron deficiency anemia 07/12/2009  . Diabetes mellitus ([place]) 07/02/2009  . Hyperlipidemia 07/02/2009  . BMI 45.0-49.9, adult ([place]) 07/02/2009  . OSA (obstructive sleep apnea) 07/02/2009  . Glaucoma 07/02/2009  . Hypertension 07/02/2009  . ASTHMA 07/02/2009  . LOW BACK PAIN SYNDROME 07/02/2009  . BACK PAIN 07/02/2009  . PERIPHERAL EDEMA 07/02/2009  . GROIN PAIN 07/02/2009  . TOBACCO USE, QUIT 07/02/2009  . HEMORRHOIDS, INTERNAL 07/16/2008  . DIVERTICULOSIS OF COLON 07/16/2008    Current Outpatient Medications:  .  aspirin EC 81 MG tablet, Take 81 mg by mouth daily with breakfast., Disp: , Rfl:  .  atorvastatin (LIPITOR) 10 MG tablet, Take 1 tablet (10 mg total) by mouth daily., Disp: 90 tablet, Rfl: 3 .  colchicine 0.6 MG tablet, Take 0.6 mg by mouth daily., Disp: , Rfl:  .  doxazosin (CARDURA) 4 MG tablet, Take 4 mg by mouth at bedtime., Disp: , Rfl:  .  insulin aspart (NOVOLOG FLEXPEN) 100 UNIT/ML FlexPen, Inject 3-20 Units into the skin 3 (three) times daily as needed for high blood sugar (CBG  150)., Disp: , Rfl:  .  insulin glargine (LANTUS) 100 unit/mL SOPN, Inject 50 Units into the skin daily before breakfast., Disp: , Rfl:  .  lisinopril (PRINIVIL,ZESTRIL) 5 MG tablet, Take 5 mg by mouth daily with breakfast. , Disp: , Rfl: 1 .  phentermine (ADIPEX-P) 37.5 MG tablet, Take 37.5 mg by mouth daily with breakfast. , Disp: , Rfl: 0 .  PRESCRIPTION MEDICATION, Inhale into the lungs at bedtime. CPAP, Disp: , Rfl:  .  torsemide (DEMADEX) 20 MG tablet, Take 60 mg by mouth daily., Disp: , Rfl:  .  Vitamin D, Ergocalciferol, (DRISDOL) 50000 units CAPS capsule, Take 1 capsule (50,000 Units total) by mouth every 7 (seven) days., Disp: 30 capsule, Rfl:  .  acetaminophen (TYLENOL) 500 MG tablet, Take 500-1,000 mg by mouth every 6 (six) hours as needed for mild pain or headache. , Disp: , Rfl:  .  albuterol (PROVENTIL HFA;VENTOLIN HFA) 108 (90 BASE) MCG/ACT inhaler, Inhale 2 puffs into the lungs every 6 (six) hours as needed for wheezing or shortness of breath. , Disp: , Rfl:  .  dorzolamide-timolol (COSOPT) 22.3-6.8 MG/ML ophthalmic solution, Place 1 drop into both eyes 2 (two) times daily., Disp: , Rfl:  .  HYDROcodone-acetaminophen (NORCO/VICODIN) 5-325 MG tablet, Take 1 tablet by mouth every 4 (four) hours as needed for moderate pain., Disp: 15 tablet, Rfl: 0 .  leuprolide (LUPRON DEPOT, 35-MONTH,) 11.25 MG injection, Inject 11.25 mg into the muscle every 3 (three) months., Disp: 1  each, Rfl: 0 .  metolazone (ZAROXOLYN) 2.5 MG tablet, Take 1 tablet (2.5 mg total) by mouth as directed. Must call CHF clinic before taking! [phone])., Disp: 5 tablet, Rfl: 0 .  predniSONE (DELTASONE) 10 MG tablet, Prednisone 30 mg x 2 days then 20 mg x 2 days then 10 mg x 2 days then 5 mg x 2 days then stop (Patient not taking: Reported on 10/03/2017), Disp: 13 tablet, Rfl: 0 .  tiZANidine (ZANAFLEX) 4 MG tablet, Take 4 mg by mouth every 12 (twelve) hours as needed for muscle spasms. Not to exceed 3 doses in 24  hours, Disp: , Rfl: 0 .  Travoprost, BAK Free, (TRAVATAN) 0.004 % SOLN ophthalmic solution, Place 1 drop into both eyes at bedtime., Disp: , Rfl:   Current Facility-Administered Medications:  .  leuprolide (LUPRON) injection 11.25 mg, 11.25 mg, Intramuscular, Once, Constant, Peggy, MD Allergies  Allergen Reactions  . Pork-Derived Products Nausea And Vomiting     Social History   Socioeconomic History  . Marital status: Single    Spouse name: Not on file  . Number of children: Not on file  . Years of education: Not on file  . Highest education level: Not on file  Social Needs  . Financial resource strain: Not on file  . Food insecurity - worry: Not on file  . Food insecurity - inability: Not on file  . Transportation needs - medical: Not on file  . Transportation needs - non-medical: Not on file  Occupational History  . Not on file  Tobacco Use  . Smoking status: Former Smoker    Types: Cigarettes    Last attempt to quit: 09/04/1998    Years since quitting: 19.1  . Smokeless tobacco: Never Used  Substance and Sexual Activity  . Alcohol use: Yes    Comment: occasional  . Drug use: No  . Sexual activity: Yes    Birth control/protection: None  Other Topics Concern  . Not on file  Social History Narrative  . Not on file    Physical Exam  Pulmonary/Chest: No respiratory distress. She has no wheezes. She has no rales.  Abdominal: She exhibits no distension. There is no tenderness. There is no guarding.  Musculoskeletal: She exhibits edema.  Edema +2 in both feet to 3inch above ankle  Skin: Skin is warm and dry. She is not diaphoretic.        Future Appointments  Date Time Provider [place]  10/17/2017 10:00 AM [facility] LAB [facility] None  10/22/2017  1:30 PM [facility] PA/NP [facility] None  10/23/2017  3:30 PM Anne Shutter, RD [place] [facility]  01/01/2018  2:15 PM Gardiner Barefoot, DPM [facility]    ATF pt CAO x4 in the bathroom with her aide getting  dressed for the day.  Pt stated that she had to use the inhaler (the other day) and feel the need to refill the albuterol prescrition.  The pharmacist stated that she still have one available on fill. She uses the CPAP at night but sometimes forget and wakes up to put it on.  Pt has taken all of her meds this week and hasn't missed a dose.  rx bottles verified and pill box refilled.  I advised pt that Joellen Jersey will began seeing her next week.  Pt stated that she has tried calling the pulmonary rehab place back but she hasn't gotten an answer.  Pt was advised to try to walk around the apartment to get some exercise.  Since  pt has gotten the wheelchair she hasn't been as mobile as she used to be. Pt stated that she took her morning meds around 830 this am.  She denies sob, chest pain and dizziness.    BP 104/70 (BP Location: Right Arm, Patient Position: Sitting, Cuff Size: Large)   Pulse 73   Resp 16   LMP 02/16/2017 (Within Weeks)   SpO2 95%  cbg 134  Weight yesterday-cant remember/she stated that we moved the scale around and now the weight is off and it may need new batteries.  Last visit weight-255 (pt' has started to weigh herself in different areas of the house)  **rx called in: Vit d (need to call in) Phentermine (can fill Thursday too early today) Atorvastatin Albuterol  doxasozin filled until thursday Eye drops:dorzolamine ([facility])  [place], EMT Paramedic 10/16/2017    ACTION: Home visit completed

## 2017-10-16 NOTE — Telephone Encounter (Signed)
I spoke with [facility].  I advised the code. They voiced understanding.

## 2017-10-16 NOTE — Telephone Encounter (Signed)
N95.0: PMB  I called [facility]-mail order. They advised me there is no rx for this patient.  I told rep I was returning a call to them, she requested reference number. I advised her a reference number was not left on the voicemail. She assured me if they had called, they would have left a number.

## 2017-10-17 ENCOUNTER — Telehealth (HOSPITAL_COMMUNITY): Payer: Self-pay

## 2017-10-17 ENCOUNTER — Ambulatory Visit (HOSPITAL_COMMUNITY)
Admission: RE | Admit: 2017-10-17 | Discharge: 2017-10-17 | Disposition: A | Discharge status: Home / Self Care | Payer: Medicaid Other | Source: Ambulatory Visit | Attending: Cardiology | Admitting: Cardiology

## 2017-10-17 DIAGNOSIS — I5032 Chronic diastolic (congestive) heart failure: Secondary | ICD-10-CM | POA: Insufficient documentation

## 2017-10-17 LAB — BASIC METABOLIC PANEL
Anion gap: 11 (ref 5–15)
BUN: 62 mg/dL — ABNORMAL HIGH (ref 6–20)
CALCIUM: 9.1 mg/dL (ref 8.9–10.3)
CO2: 24 mmol/L (ref 22–32)
Chloride: 104 mmol/L (ref 101–111)
Creatinine, Ser: 2.13 mg/dL — ABNORMAL HIGH (ref 0.44–1.00)
GFR, EST AFRICAN AMERICAN: 28 mL/min — AB (ref >60)
GFR, EST NON AFRICAN AMERICAN: 24 mL/min — AB (ref >60)
Glucose, Bld: 184 mg/dL — ABNORMAL HIGH (ref 65–99)
Potassium: 4.1 mmol/L (ref 3.5–5.1)
SODIUM: 139 mmol/L (ref 135–145)

## 2017-10-17 NOTE — Telephone Encounter (Signed)
Patient called yesterday 10/16/2017 to express that she was interested in the Pulmonary Rehab Maintenance program. Patient stated she is not stable and is currently in a wheel chair. The only time she gets out of her wheel chair is when she goes to the bathroom because she is afraid she will fall. Patient spoke with Lattie Haw our Pulmonary Rehab Maintenance program Coordinator and she strongly encouraged patient to discuss going to [facility] program to her PCP. Lattie Haw gave patient the phone number and patient agreed to this plan.

## 2017-10-22 ENCOUNTER — Ambulatory Visit (HOSPITAL_COMMUNITY)
Admission: RE | Admit: 2017-10-22 | Discharge: 2017-10-22 | Disposition: A | Discharge status: Home / Self Care | Payer: Medicaid Other | Source: Ambulatory Visit | Attending: Cardiology | Admitting: Cardiology

## 2017-10-22 ENCOUNTER — Other Ambulatory Visit (HOSPITAL_COMMUNITY): Payer: Self-pay

## 2017-10-22 VITALS — BP 142/66 | HR 56 | Wt 263.8 lb

## 2017-10-22 DIAGNOSIS — I10 Essential (primary) hypertension: Secondary | ICD-10-CM | POA: Diagnosis not present

## 2017-10-22 DIAGNOSIS — Z6841 Body Mass Index (BMI) 40.0 and over, adult: Secondary | ICD-10-CM | POA: Insufficient documentation

## 2017-10-22 DIAGNOSIS — I130 Hypertensive heart and chronic kidney disease with heart failure and stage 1 through stage 4 chronic kidney disease, or unspecified chronic kidney disease: Secondary | ICD-10-CM | POA: Diagnosis present

## 2017-10-22 DIAGNOSIS — F329 Major depressive disorder, single episode, unspecified: Secondary | ICD-10-CM | POA: Diagnosis not present

## 2017-10-22 DIAGNOSIS — E1165 Type 2 diabetes mellitus with hyperglycemia: Secondary | ICD-10-CM

## 2017-10-22 DIAGNOSIS — N183 Chronic kidney disease, stage 3 unspecified: Secondary | ICD-10-CM

## 2017-10-22 DIAGNOSIS — E1122 Type 2 diabetes mellitus with diabetic chronic kidney disease: Secondary | ICD-10-CM | POA: Insufficient documentation

## 2017-10-22 DIAGNOSIS — I5032 Chronic diastolic (congestive) heart failure: Secondary | ICD-10-CM | POA: Insufficient documentation

## 2017-10-22 DIAGNOSIS — Z87891 Personal history of nicotine dependence: Secondary | ICD-10-CM | POA: Insufficient documentation

## 2017-10-22 DIAGNOSIS — N939 Abnormal uterine and vaginal bleeding, unspecified: Secondary | ICD-10-CM | POA: Diagnosis not present

## 2017-10-22 DIAGNOSIS — Z794 Long term (current) use of insulin: Secondary | ICD-10-CM

## 2017-10-22 DIAGNOSIS — G4733 Obstructive sleep apnea (adult) (pediatric): Secondary | ICD-10-CM | POA: Diagnosis not present

## 2017-10-22 DIAGNOSIS — E785 Hyperlipidemia, unspecified: Secondary | ICD-10-CM | POA: Diagnosis not present

## 2017-10-22 DIAGNOSIS — N184 Chronic kidney disease, stage 4 (severe): Secondary | ICD-10-CM | POA: Diagnosis not present

## 2017-10-22 DIAGNOSIS — F419 Anxiety disorder, unspecified: Secondary | ICD-10-CM | POA: Diagnosis not present

## 2017-10-22 DIAGNOSIS — J45909 Unspecified asthma, uncomplicated: Secondary | ICD-10-CM | POA: Diagnosis not present

## 2017-10-22 DIAGNOSIS — R001 Bradycardia, unspecified: Secondary | ICD-10-CM | POA: Diagnosis not present

## 2017-10-22 DIAGNOSIS — Z79899 Other long term (current) drug therapy: Secondary | ICD-10-CM | POA: Diagnosis not present

## 2017-10-22 DIAGNOSIS — G8929 Other chronic pain: Secondary | ICD-10-CM | POA: Diagnosis not present

## 2017-10-22 MED ORDER — TORSEMIDE 20 MG PO TABS: 80.0000 mg | ORAL_TABLET | Freq: Every day | ORAL | 11 refills | Status: DC

## 2017-10-22 NOTE — Progress Notes (Signed)
Advanced Heart Failure Clinic Note   Referring Physician: Dr. Johnsie Cancel Primary Care: [place], [place]  Primary Cardiologist: Dr. Johnsie Cancel HF: Dr. Aundra Dubin  HPI: [name] is a 62 y.o. female with PMH Chronic diastolic CHF, OSA, Morbid obesity, DM2, HTN, CKD, and sinus bradycardia.   Admitted 11/2016 with CHF. Echo and RHC as below. Normal LVEF with grade 2 DD. RHC with normal pressures s/p diuresis. Discharge weight 246 lbs.   Seen by Dr. Johnsie Cancel 01/18/2017. Switched from lasix 40 mg daily to torsemide 20 mg BID with bloating and weight gain. Pt weight at that visit 252 lbs ( 6 lbs over 2 months)  Social: On disability. Had back surgery 08/2014 and has ongoing chronic back pain. Lives at home with roommate. Daughter lives in [place].   She presents today for follow up. At last visit given extra torsemide + metolazone. Weight initially down but now up 3 lbs since last visit, up 6 lbs at home by her scale. Using CPAP more often, but not every night. Taking all medications as directed. Seeing OBGYN for vaginal bleeding. Says she is to get a "shot". Paramedicine continues to follow, who reports intermittent compliance with medications and diet. She ate poorly yesterday for the super bowl, and did NOT take her torsemide. She has had a dry cough for ~ 2 weeks. Denies fevers or chills. Clear to white production at times.   Review of systems complete and found to be negative unless listed in HPI.    Echo 12/02/16 LVEF 60-65%, Grade 2 DD, Trivial TR, Trivial PI. Normal RV.   [place] 12/06/16: Hemodynamics (mmHg) RA mean 6 RV 48/7 PA 45/14 (mean 23) PCWP 8 PVR 2.7 WU AO 99% Cardiac Output (Fick) 5.6 Cardiac Index (Fick) 2.72  Past Medical History:  Diagnosis Date  . Achilles rupture   . Anxiety   . Arthritis   . Asthma   . Chronic back pain   . Chronic headaches   . Chronic leg pain    bilaterally  . Depression   . Diabetes mellitus 1999   T2DM  . Glaucoma   . H/O gestational diabetes  mellitus, not currently pregnant   . Hyperlipidemia   . Hypertension   . Morbid obesity ([place])   . OSA (obstructive sleep apnea)   . Sciatica     Current Outpatient Medications  Medication Sig Dispense Refill  . acetaminophen (TYLENOL) 500 MG tablet Take 500-1,000 mg by mouth every 6 (six) hours as needed for mild pain or headache.     . albuterol (PROVENTIL HFA;VENTOLIN HFA) 108 (90 BASE) MCG/ACT inhaler Inhale 2 puffs into the lungs every 6 (six) hours as needed for wheezing or shortness of breath.     Marland Kitchen aspirin EC 81 MG tablet Take 81 mg by mouth daily with breakfast.    . atorvastatin (LIPITOR) 10 MG tablet Take 1 tablet (10 mg total) by mouth daily. 90 tablet 3  . colchicine 0.6 MG tablet Take 0.6 mg by mouth daily.    . dorzolamide-timolol (COSOPT) 22.3-6.8 MG/ML ophthalmic solution Place 1 drop into both eyes 2 (two) times daily.    Marland Kitchen doxazosin (CARDURA) 4 MG tablet Take 4 mg by mouth at bedtime.    Marland Kitchen HYDROcodone-acetaminophen (NORCO/VICODIN) 5-325 MG tablet Take 1 tablet by mouth every 4 (four) hours as needed for moderate pain. 15 tablet 0  . insulin aspart (NOVOLOG FLEXPEN) 100 UNIT/ML FlexPen Inject 3-20 Units into the skin 3 (three) times daily as needed for high blood sugar (CBG  150).    . insulin glargine (LANTUS) 100 unit/mL SOPN Inject 50 Units into the skin daily before breakfast.    . leuprolide (LUPRON DEPOT, 52-MONTH,) 11.25 MG injection Inject 11.25 mg into the muscle every 3 (three) months. 1 each 0  . lisinopril (PRINIVIL,ZESTRIL) 5 MG tablet Take 5 mg by mouth daily with breakfast.   1  . metolazone (ZAROXOLYN) 2.5 MG tablet Take 1 tablet (2.5 mg total) by mouth as directed. Must call CHF clinic before taking! [phone]). 5 tablet 0  . phentermine (ADIPEX-P) 37.5 MG tablet Take 37.5 mg by mouth daily with breakfast.   0  . predniSONE (DELTASONE) 10 MG tablet Prednisone 30 mg x 2 days then 20 mg x 2 days then 10 mg x 2 days then 5 mg x 2 days then stop 13 tablet 0  .  PRESCRIPTION MEDICATION Inhale into the lungs at bedtime. CPAP    . tiZANidine (ZANAFLEX) 4 MG tablet Take 4 mg by mouth every 12 (twelve) hours as needed for muscle spasms. Not to exceed 3 doses in 24 hours  0  . torsemide (DEMADEX) 20 MG tablet Take 60 mg by mouth daily.    . Travoprost, BAK Free, (TRAVATAN) 0.004 % SOLN ophthalmic solution Place 1 drop into both eyes at bedtime.    . Vitamin D, Ergocalciferol, (DRISDOL) 50000 units CAPS capsule Take 1 capsule (50,000 Units total) by mouth every 7 (seven) days. 30 capsule    Current Facility-Administered Medications  Medication Dose Route Frequency Provider Last Rate Last Dose  . leuprolide (LUPRON) injection 11.25 mg  11.25 mg Intramuscular Once Constant, Peggy, MD        Allergies  Allergen Reactions  . Pork-Derived Products Nausea And Vomiting     Social History   Socioeconomic History  . Marital status: Single    Spouse name: Not on file  . Number of children: Not on file  . Years of education: Not on file  . Highest education level: Not on file  Social Needs  . Financial resource strain: Not on file  . Food insecurity - worry: Not on file  . Food insecurity - inability: Not on file  . Transportation needs - medical: Not on file  . Transportation needs - non-medical: Not on file  Occupational History  . Not on file  Tobacco Use  . Smoking status: Former Smoker    Types: Cigarettes    Last attempt to quit: 09/04/1998    Years since quitting: 19.1  . Smokeless tobacco: Never Used  Substance and Sexual Activity  . Alcohol use: Yes    Comment: occasional  . Drug use: No  . Sexual activity: Yes    Birth control/protection: None  Other Topics Concern  . Not on file  Social History Narrative  . Not on file      Family History  Problem Relation Age of Onset  . Heart failure Mother   . Renal Disease Mother     Vitals:   10/22/17 1330 10/22/17 1336  BP:  (!) 142/66  Pulse:  (!) 56  SpO2:  100%  Weight: 263 lb  12.8 oz (119.7 kg) 263 lb 12.8 oz (119.7 kg)     Wt Readings from Last 3 Encounters:  10/22/17 263 lb 12.8 oz (119.7 kg)  10/10/17 248 lb (112.5 kg)  10/09/17 260 lb 12.8 oz (118.3 kg)    PHYSICAL EXAM: General: Obese. NAD.  HEENT: Normal Neck: Supple. JVP to jaw. Carotids 2+ bilat; no bruits.  No thyromegaly or nodule noted. Cor: PMI nondisplaced. RRR, No M/G/R noted Lungs: CTAB, normal effort. Abdomen: Soft, non-tender, non-distended, no HSM. No bruits or masses. +BS  Extremities: No cyanosis, clubbing, or rash. 1-2+ edema to knees.  Neuro: Alert & orientedx3, cranial nerves grossly intact. moves all 4 extremities w/o difficulty. Affect pleasant   ASSESSMENT & PLAN:  1. Chronic diastolic CHF: Echo 10/4266 EF 60-65%, Grade 2 DD, trivial TR, normal RV.  - NYHA III symptoms. Limited by her mobility.   - Volume elevated on exam.  - Take torsemide 80 mg BID x 2 days and then 80 mg daily.  - Take metolazone as needed.  - Creatinine > 2.0 is contraindication for spironolactone.  - Reinforced fluid restriction to < 2 L daily, sodium restriction to less than 2000 mg daily, and the importance of daily weights. - Not candidate for pulmonary rehab due to immobility.   2. CKD Stage III - IV: baseline creatinine 1.6-1.8 - BMET last week stable with somewhat improved creatinine.  - Establishes with renal this month.   3. Morbid obesity - Body mass index is 49.04 kg/m. - Encouraged her to limit portions and increase exercise.   4. HTN - Continue lisinopril 5 mg daily.  - Continue cardura 5 mg daily  5. Sinus bradycardia - Stable. No room for BB.   6. OSA - Encouraged nightly use. She is non-compliant for the most part.   7. DM2 - Per PCP.   8. Vaginal bleeding - Recurrent. PCP managing.   Torsemide as above. Reinforce importance of med and CPAP compliance and salt/fluid restriction. Needs to keep renal appointment.   Shirley Friar, PA-C 10/22/17   Greater than  50% of the 25 minute visit was spent in counseling/coordination of care regarding disease state education, salt/fluid restriction, sliding scale diuretics, and medication compliance.

## 2017-10-22 NOTE — Patient Instructions (Signed)
Take Torsemide 80 mg TWICE daily for TWO DAYS. Then reduce to 80 mg ONCE daily starting Wednesday 10/24/2017.  Follow up with Oda Kilts PA-C in 2 weeks.  ______________________________________________________________ [name] Code: 9001  Take all medication as prescribed the day of your appointment. Bring all medications with you to your appointment.  Do the following things EVERYDAY: 1) Weigh yourself in the morning before breakfast. Write it down and keep it in a log. 2) Take your medicines as prescribed 3) Eat low salt foods-Limit salt (sodium) to 2000 mg per day.  4) Stay as active as you can everyday 5) Limit all fluids for the day to less than 2 liters

## 2017-10-22 NOTE — Progress Notes (Signed)
Paramedicine Encounter   Patient ID: [name] , female,   DOB: 05/07/1956,61 y.o.,  MRN: [id number]   Met patient in clinic today with provider.  Weight in clinic-263 Weight at [place] visits 2wks ago-255 Will need to go see her tomor to fill pill box-in the morning 1030-11. She missed her meds yesterday due to church and she rides the bus and forgot and didn't take any all day. She reports taking it this morning.  She reports vaginal bleeding also but is being treated and seen by doc for it.  Using her CPAP machine more.  Pt is very immobile and stays in her wheelchair. Pt states she is having back pains and that limits her walking and she also gets sob during that time. Pt reports that she is coughing up phlegm. Pt states she has been having intermittent chest pain in center of chest. Goes away with several minutes of rest.  She does have home aide that comes daily.  Concerned with her med compliance and if she is keeping check on her CBG's--when asked what it was this morning she didn't want to answer and then she said she woke up eating fruit.  She is seeing a nutritionist tomor.  Jonni Sanger increasing her  torsemide 80 BID 2 days keep taking 4 tabs daily.  She is also getting compression stockings.  She uses medicaid transportation.  Wants a list of low income housing or senior housing. Income $771 from disability.  Time spent with patient 40 min   Marylouise Stacks, EMT-Paramedic 10/22/2017   ACTION: Home visit completed

## 2017-10-23 ENCOUNTER — Other Ambulatory Visit (HOSPITAL_COMMUNITY): Payer: Self-pay

## 2017-10-23 ENCOUNTER — Encounter: Payer: Medicaid Other | Attending: Specialist | Admitting: *Deleted

## 2017-10-23 DIAGNOSIS — E785 Hyperlipidemia, unspecified: Secondary | ICD-10-CM | POA: Insufficient documentation

## 2017-10-23 DIAGNOSIS — Z713 Dietary counseling and surveillance: Secondary | ICD-10-CM | POA: Insufficient documentation

## 2017-10-23 DIAGNOSIS — N289 Disorder of kidney and ureter, unspecified: Secondary | ICD-10-CM | POA: Insufficient documentation

## 2017-10-23 DIAGNOSIS — E1165 Type 2 diabetes mellitus with hyperglycemia: Secondary | ICD-10-CM

## 2017-10-23 DIAGNOSIS — E118 Type 2 diabetes mellitus with unspecified complications: Secondary | ICD-10-CM | POA: Insufficient documentation

## 2017-10-23 DIAGNOSIS — Z794 Long term (current) use of insulin: Secondary | ICD-10-CM

## 2017-10-23 NOTE — Progress Notes (Signed)
Paramedicine Encounter    Patient ID: [name], female    DOB: 05/09/1956, 62 y.o.   MRN: [id number]   Patient Care Team: Javier Docker, MD as PCP - General (Internal Medicine)  Patient Active Problem List   Diagnosis Date Noted  . Nausea & vomiting 07/01/2017  . Acute gout due to renal impairment involving left ankle   . Hypertensive heart and renal disease   . SOB (shortness of breath)   . Chronic diastolic heart failure ([place]) 11/30/2016  . CKD (chronic kidney disease) stage 3, GFR 30-59 ml/min (HCC) 11/30/2016  . Thrombocytopenia ([place]) 11/30/2016  . Sinus bradycardia 11/30/2016  . PMB (postmenopausal bleeding) 05/09/2010  . ANEMIA 05/05/2010  . DEPRESSION 04/26/2010  . TMJ SYNDROME 04/26/2010  . DEGENERATIVE DISC DISEASE, CERVICAL SPINE 04/26/2010  . CHEST PAIN 04/26/2010  . CHRONIC OBSTRUCTIVE PULMONARY DISEASE, MODERATE 03/26/2010  . MASTALGIA 10/08/2009  . Iron deficiency anemia 07/12/2009  . Diabetes mellitus ([place]) 07/02/2009  . Hyperlipidemia 07/02/2009  . BMI 45.0-49.9, adult ([place]) 07/02/2009  . OSA (obstructive sleep apnea) 07/02/2009  . Glaucoma 07/02/2009  . Hypertension 07/02/2009  . ASTHMA 07/02/2009  . LOW BACK PAIN SYNDROME 07/02/2009  . BACK PAIN 07/02/2009  . PERIPHERAL EDEMA 07/02/2009  . GROIN PAIN 07/02/2009  . TOBACCO USE, QUIT 07/02/2009  . HEMORRHOIDS, INTERNAL 07/16/2008  . DIVERTICULOSIS OF COLON 07/16/2008    Current Outpatient Medications:  .  acetaminophen (TYLENOL) 500 MG tablet, Take 500-1,000 mg by mouth every 6 (six) hours as needed for mild pain or headache. , Disp: , Rfl:  .  albuterol (PROVENTIL HFA;VENTOLIN HFA) 108 (90 BASE) MCG/ACT inhaler, Inhale 2 puffs into the lungs every 6 (six) hours as needed for wheezing or shortness of breath. , Disp: , Rfl:  .  aspirin EC 81 MG tablet, Take 81 mg by mouth daily with breakfast., Disp: , Rfl:  .  atorvastatin (LIPITOR) 10 MG tablet, Take 1 tablet (10 mg total) by mouth daily.,  Disp: 90 tablet, Rfl: 3 .  colchicine 0.6 MG tablet, Take 0.6 mg by mouth daily., Disp: , Rfl:  .  dorzolamide-timolol (COSOPT) 22.3-6.8 MG/ML ophthalmic solution, Place 1 drop into both eyes 2 (two) times daily., Disp: , Rfl:  .  doxazosin (CARDURA) 4 MG tablet, Take 4 mg by mouth at bedtime., Disp: , Rfl:  .  HYDROcodone-acetaminophen (NORCO/VICODIN) 5-325 MG tablet, Take 1 tablet by mouth every 4 (four) hours as needed for moderate pain., Disp: 15 tablet, Rfl: 0 .  insulin aspart (NOVOLOG FLEXPEN) 100 UNIT/ML FlexPen, Inject 3-20 Units into the skin 3 (three) times daily as needed for high blood sugar (CBG 150)., Disp: , Rfl:  .  insulin glargine (LANTUS) 100 unit/mL SOPN, Inject 50 Units into the skin daily before breakfast., Disp: , Rfl:  .  leuprolide (LUPRON DEPOT, 29-MONTH,) 11.25 MG injection, Inject 11.25 mg into the muscle every 3 (three) months., Disp: 1 each, Rfl: 0 .  lisinopril (PRINIVIL,ZESTRIL) 5 MG tablet, Take 5 mg by mouth daily with breakfast. , Disp: , Rfl: 1 .  metolazone (ZAROXOLYN) 2.5 MG tablet, Take 1 tablet (2.5 mg total) by mouth as directed. Must call CHF clinic before taking! [phone])., Disp: 5 tablet, Rfl: 0 .  phentermine (ADIPEX-P) 37.5 MG tablet, Take 37.5 mg by mouth daily with breakfast. , Disp: , Rfl: 0 .  PRESCRIPTION MEDICATION, Inhale into the lungs at bedtime. CPAP, Disp: , Rfl:  .  torsemide (DEMADEX) 20 MG tablet, Take 4 tablets (80 mg total)  by mouth daily., Disp: 120 tablet, Rfl: 11 .  Travoprost, BAK Free, (TRAVATAN) 0.004 % SOLN ophthalmic solution, Place 1 drop into both eyes at bedtime., Disp: , Rfl:  .  Vitamin D, Ergocalciferol, (DRISDOL) 50000 units CAPS capsule, Take 1 capsule (50,000 Units total) by mouth every 7 (seven) days., Disp: 30 capsule, Rfl:  .  predniSONE (DELTASONE) 10 MG tablet, Prednisone 30 mg x 2 days then 20 mg x 2 days then 10 mg x 2 days then 5 mg x 2 days then stop (Patient not taking: Reported on 10/23/2017), Disp: 13  tablet, Rfl: 0 .  tiZANidine (ZANAFLEX) 4 MG tablet, Take 4 mg by mouth every 12 (twelve) hours as needed for muscle spasms. Not to exceed 3 doses in 24 hours, Disp: , Rfl: 0  Current Facility-Administered Medications:  .  leuprolide (LUPRON) injection 11.25 mg, 11.25 mg, Intramuscular, Once, Constant, Peggy, MD Allergies  Allergen Reactions  . Pork-Derived Products Nausea And Vomiting     Social History   Socioeconomic History  . Marital status: Single    Spouse name: Not on file  . Number of children: Not on file  . Years of education: Not on file  . Highest education level: Not on file  Social Needs  . Financial resource strain: Not on file  . Food insecurity - worry: Not on file  . Food insecurity - inability: Not on file  . Transportation needs - medical: Not on file  . Transportation needs - non-medical: Not on file  Occupational History  . Not on file  Tobacco Use  . Smoking status: Former Smoker    Types: Cigarettes    Last attempt to quit: 09/04/1998    Years since quitting: 19.1  . Smokeless tobacco: Never Used  Substance and Sexual Activity  . Alcohol use: Yes    Comment: occasional  . Drug use: No  . Sexual activity: Yes    Birth control/protection: None  Other Topics Concern  . Not on file  Social History Narrative  . Not on file    Physical Exam      Future Appointments  Date Time Provider [place]  10/23/2017  3:30 PM Anne Shutter, [place] N[facility]  11/05/2017  2:00 PM [facility] PA/NP [facility] None  01/01/2018  2:15 PM Gardiner Barefoot, DPM [facility]   BP (!) 136/56   Pulse 70   Resp 16   Wt 264 lb (119.7 kg)   LMP 02/16/2017 (Within Weeks)   SpO2 99%   BMI 49.07 kg/m  Weight yesterday-261 Last visit weight-255?  Pt reports she is still coughing, she has been using the deslym cough medicine. She was in bathroom when I arrived and while she in there I filled pill box up for her. She has a lot of swelling to her legs.  She confines herself to a wheelchair, not elevating her feet often. she does very limited movement.  Jonni Sanger increased her torsemide yesterday for 2 days. Weight as noted today.  Not sure if she is compliant with the med change and sodium/fluid intake limits. Pt is in a rush for me to leave so aid can give her a bath before her time is up. Will continue to reinforce the fluid/sodium/exercise for her next visit.   ACTION: Home visit completed  Marylouise Stacks, EMT-Paramedic [phone] 10/23/17

## 2017-10-30 ENCOUNTER — Telehealth (HOSPITAL_COMMUNITY): Payer: Self-pay

## 2017-10-31 NOTE — Patient Instructions (Signed)
Plan:  Aim for 2-3 Carb Choices per meal (30-45 grams)  Aim for 0-1 Carbs per snack if hungry  Include protein in moderation with your meals and snacks Consider reading food labels for Total Carbohydrate  of foods Consider  increasing your activity level by walking as tolerated daily as tolerated Consider checking BG at alternate times per day  Consider taking medication as directed by MD

## 2017-10-31 NOTE — Progress Notes (Signed)
Diabetes Self-Management Education  Visit Type:  Follow-up  Appt. Start Time: 1530 Appt. End Time: 1700  10/31/2017  [name], identified by name and date of birth, is a 62 y.o. female with a diagnosis of Diabetes:  .  Patient states she was diagnosed with GDM about 30 years ago. She currently has her own meter and tests about 3 times a day. Diet history obtained. She is interested in carb counting update. ASSESSMENT  Height 5' 1.5" (1.562 m), weight 261 lb 3.2 oz (118.5 kg), last menstrual period 02/16/2017. Body mass index is 48.55 kg/m.   Diabetes Self-Management Education - 10/23/17 1552      Health Coping   How would you rate your overall health?  Fair      Psychosocial Assessment   Patient Belief/Attitude about Diabetes  Motivated to manage diabetes      Complications   Last HgB A1C per patient/outside source  -- 8.8    How often do you check your blood sugar?  3-4 times/day    Have you had a dilated eye exam in the past 12 months?  Yes    Have you had a dental exam in the past 12 months?  No    Are you checking your feet?  Yes    How many days per week are you checking your feet?  7      Dietary Intake   Breakfast  left overs; cabbage, home made vegetable soup and piece of toast OR other left overs from dinner.     Snack (morning)  fresh fruit, nuts    Lunch  nuts, raisins, fruit OR chicken and beans and salad or other vegetables    Snack (afternoon)  same as AM    Dinner  meat, starch, vegetables and sometimes beans    Snack (evening)  same as AM OR sugar free ice cream    Beverage(s)  coffee (on 2 liter fluid restriction), water, flavored water with lemons or cucumber, lemon-lime seltzer water      Patient Education   Previous Diabetes Education  Yes (please comment) history of GDM 30 years ago    Disease state   Definition of diabetes, type 1 and 2, and the diagnosis of diabetes    Nutrition management   Role of diet in the treatment of diabetes and the  relationship between the three main macronutrients and blood glucose level;Food label reading, portion sizes and measuring food.;Carbohydrate counting    Physical activity and exercise   Role of exercise on diabetes management, blood pressure control and cardiac health.    Medications  Reviewed patients medication for diabetes, action, purpose, timing of dose and side effects.    Monitoring  Identified appropriate SMBG and/or A1C goals.    Acute complications  Taught treatment of hypoglycemia - the 15 rule.      Individualized Goals (developed by patient)   Nutrition  Follow meal plan discussed    Physical Activity  Exercise 3-5 times per week    Medications  take my medication as prescribed    Monitoring   test blood glucose pre and post meals as discussed      Outcomes   Program Status  Not Completed      Learning Objective:  Patient will have a greater understanding of diabetes self-management. Patient education plan is to attend individual and/or group sessions per assessed needs and concerns.   Plan:   Patient Instructions  Plan:  Aim for 2-3 Carb Choices per  meal (30-45 grams)  Aim for 0-1 Carbs per snack if hungry  Include protein in moderation with your meals and snacks Consider reading food labels for Total Carbohydrate  of foods Consider  increasing your activity level by walking as tolerated daily as tolerated Consider checking BG at alternate times per day  Consider taking medication as directed by MD  Expected Outcomes:  Demonstrated interest in learning. Expect positive outcomes  Education material provided: Living Well with Diabetes, Meal plan card, Snack sheet and Carbohydrate counting sheet, Insulin Action handout  If problems or questions, patient to contact team via:  Phone  Future DSME appointment: - 4-6 wks

## 2017-11-01 ENCOUNTER — Telehealth (HOSPITAL_COMMUNITY): Payer: Self-pay

## 2017-11-01 NOTE — Telephone Encounter (Signed)
Called pt regarding appointment today to see if she back home for our visit today--I had to leave message for her--she did call back and advise her bus isnt there to pick her up yet and she isnt sure when she will return home. Will try to resch for next week.   Marylouise Stacks, EMT-Paramedic 11/01/17

## 2017-11-01 NOTE — Telephone Encounter (Signed)
Pt called to cancel appointment this morning. She reports she has to go to appointment and not sure her bus will be back in time and then she wants to go home and rest. She advised I could come out Thursday at 4 if she is back in time that day also.    Marylouise Stacks, EMT-Paramedic  11/01/17

## 2017-11-05 ENCOUNTER — Encounter (HOSPITAL_COMMUNITY): Payer: Medicaid Other

## 2017-11-07 ENCOUNTER — Other Ambulatory Visit (HOSPITAL_COMMUNITY): Payer: Self-pay

## 2017-11-07 NOTE — Progress Notes (Signed)
Paramedicine Encounter    Patient ID: [name], female    DOB: 1956/02/10, 62 y.o.   MRN: [id number]   Patient Care Team: Javier Docker, MD as PCP - General (Internal Medicine)  Patient Active Problem List   Diagnosis Date Noted  . Nausea & vomiting 07/01/2017  . Acute gout due to renal impairment involving left ankle   . Hypertensive heart and renal disease   . SOB (shortness of breath)   . Chronic diastolic heart failure ([place]) 11/30/2016  . CKD (chronic kidney disease) stage 3, GFR 30-59 ml/min (HCC) 11/30/2016  . Thrombocytopenia ([place]) 11/30/2016  . Sinus bradycardia 11/30/2016  . PMB (postmenopausal bleeding) 05/09/2010  . ANEMIA 05/05/2010  . DEPRESSION 04/26/2010  . TMJ SYNDROME 04/26/2010  . DEGENERATIVE DISC DISEASE, CERVICAL SPINE 04/26/2010  . CHEST PAIN 04/26/2010  . CHRONIC OBSTRUCTIVE PULMONARY DISEASE, MODERATE 03/26/2010  . MASTALGIA 10/08/2009  . Iron deficiency anemia 07/12/2009  . Diabetes mellitus ([place]) 07/02/2009  . Hyperlipidemia 07/02/2009  . BMI 45.0-49.9, adult ([place]) 07/02/2009  . OSA (obstructive sleep apnea) 07/02/2009  . Glaucoma 07/02/2009  . Hypertension 07/02/2009  . ASTHMA 07/02/2009  . LOW BACK PAIN SYNDROME 07/02/2009  . BACK PAIN 07/02/2009  . PERIPHERAL EDEMA 07/02/2009  . GROIN PAIN 07/02/2009  . TOBACCO USE, QUIT 07/02/2009  . HEMORRHOIDS, INTERNAL 07/16/2008  . DIVERTICULOSIS OF COLON 07/16/2008    Current Outpatient Medications:  .  acetaminophen (TYLENOL) 500 MG tablet, Take 500-1,000 mg by mouth every 6 (six) hours as needed for mild pain or headache. , Disp: , Rfl:  .  albuterol (PROVENTIL HFA;VENTOLIN HFA) 108 (90 BASE) MCG/ACT inhaler, Inhale 2 puffs into the lungs every 6 (six) hours as needed for wheezing or shortness of breath. , Disp: , Rfl:  .  aspirin EC 81 MG tablet, Take 81 mg by mouth daily with breakfast., Disp: , Rfl:  .  atorvastatin (LIPITOR) 10 MG tablet, Take 1 tablet (10 mg total) by mouth daily.,  Disp: 90 tablet, Rfl: 3 .  colchicine 0.6 MG tablet, Take 0.6 mg by mouth daily., Disp: , Rfl:  .  dorzolamide-timolol (COSOPT) 22.3-6.8 MG/ML ophthalmic solution, Place 1 drop into both eyes 2 (two) times daily., Disp: , Rfl:  .  doxazosin (CARDURA) 4 MG tablet, Take 4 mg by mouth at bedtime., Disp: , Rfl:  .  HYDROcodone-acetaminophen (NORCO/VICODIN) 5-325 MG tablet, Take 1 tablet by mouth every 4 (four) hours as needed for moderate pain., Disp: 15 tablet, Rfl: 0 .  insulin aspart (NOVOLOG FLEXPEN) 100 UNIT/ML FlexPen, Inject 3-20 Units into the skin 3 (three) times daily as needed for high blood sugar (CBG 150)., Disp: , Rfl:  .  insulin glargine (LANTUS) 100 unit/mL SOPN, Inject 50 Units into the skin daily before breakfast., Disp: , Rfl:  .  leuprolide (LUPRON DEPOT, 2-MONTH,) 11.25 MG injection, Inject 11.25 mg into the muscle every 3 (three) months., Disp: 1 each, Rfl: 0 .  lisinopril (PRINIVIL,ZESTRIL) 5 MG tablet, Take 5 mg by mouth daily with breakfast. , Disp: , Rfl: 1 .  metolazone (ZAROXOLYN) 2.5 MG tablet, Take 1 tablet (2.5 mg total) by mouth as directed. Must call CHF clinic before taking! [phone])., Disp: 5 tablet, Rfl: 0 .  phentermine (ADIPEX-P) 37.5 MG tablet, Take 37.5 mg by mouth daily with breakfast. , Disp: , Rfl: 0 .  predniSONE (DELTASONE) 10 MG tablet, Prednisone 30 mg x 2 days then 20 mg x 2 days then 10 mg x 2 days then 5 mg  x 2 days then stop (Patient not taking: Reported on 10/23/2017), Disp: 13 tablet, Rfl: 0 .  PRESCRIPTION MEDICATION, Inhale into the lungs at bedtime. CPAP, Disp: , Rfl:  .  tiZANidine (ZANAFLEX) 4 MG tablet, Take 4 mg by mouth every 12 (twelve) hours as needed for muscle spasms. Not to exceed 3 doses in 24 hours, Disp: , Rfl: 0 .  torsemide (DEMADEX) 20 MG tablet, Take 4 tablets (80 mg total) by mouth daily., Disp: 120 tablet, Rfl: 11 .  Travoprost, BAK Free, (TRAVATAN) 0.004 % SOLN ophthalmic solution, Place 1 drop into both eyes at bedtime.,  Disp: , Rfl:  .  Vitamin D, Ergocalciferol, (DRISDOL) 50000 units CAPS capsule, Take 1 capsule (50,000 Units total) by mouth every 7 (seven) days., Disp: 30 capsule, Rfl:   Current Facility-Administered Medications:  .  leuprolide (LUPRON) injection 11.25 mg, 11.25 mg, Intramuscular, Once, Constant, Peggy, MD Allergies  Allergen Reactions  . Pork-Derived Products Nausea And Vomiting      Social History   Socioeconomic History  . Marital status: Single    Spouse name: Not on file  . Number of children: Not on file  . Years of education: Not on file  . Highest education level: Not on file  Social Needs  . Financial resource strain: Not on file  . Food insecurity - worry: Not on file  . Food insecurity - inability: Not on file  . Transportation needs - medical: Not on file  . Transportation needs - non-medical: Not on file  Occupational History  . Not on file  Tobacco Use  . Smoking status: Former Smoker    Types: Cigarettes    Last attempt to quit: 09/04/1998    Years since quitting: 19.1  . Smokeless tobacco: Never Used  Substance and Sexual Activity  . Alcohol use: Yes    Comment: occasional  . Drug use: No  . Sexual activity: Yes    Birth control/protection: None  Other Topics Concern  . Not on file  Social History Narrative  . Not on file    Physical Exam      Future Appointments  Date Time Provider [place]  11/15/2017  2:30 PM [facility] PA/NP [facility] None  11/20/2017  2:30 PM Anne Shutter, RD [place] NDM  01/01/2018  2:15 PM Gardiner Barefoot, DPM [facility]    BP (!) 132/0 Comment: Palplated  Pulse 76   Resp 15   Wt 258 lb (117 kg)   LMP 02/16/2017 (Within Weeks)   SpO2 99%   BMI 47.96 kg/m   Weight yesterday-258  Last visit weight-264  CBG-192  When I arrived pt was sitting in wheelchair crying, she just received news her older sister died today and she lived in [place]. She began to express feelings and shared memories of  her sister with me, her daughter is going to try to take time off for them to go to funeral services but not sure if they will be able to go. I offered to call someone for her to come be with her so she isnt alone at this difficult time and she reports she has a counselor appointment today at 4 and then her daughter will come by tonight after work so that's good.  She has been taking meds out of her bottles this past week. She is fully aware of what she takes and when so I feel that in a short time she should be able to do that independently. She also reports her  aide has helped with that before too but her aide has been off this past week. No abnormal swelling noted. Pt got back in wheelchair to go short distance to kitchen to eat. She states she wants to get out of chair, im not sure how much time she is actually trying to do out of the chair.   Marylouise Stacks, [facility] Paramedic  11/07/17

## 2017-11-11 IMAGING — CR DG FOOT COMPLETE 3+V*L*
3 series · 3 of 3 positions shown · non-contrast
Comparison: 04/06/2014

CLINICAL DATA: Left foot pain x1 day

EXAM:
LEFT FOOT - COMPLETE 3+ VIEW

[foot ap]
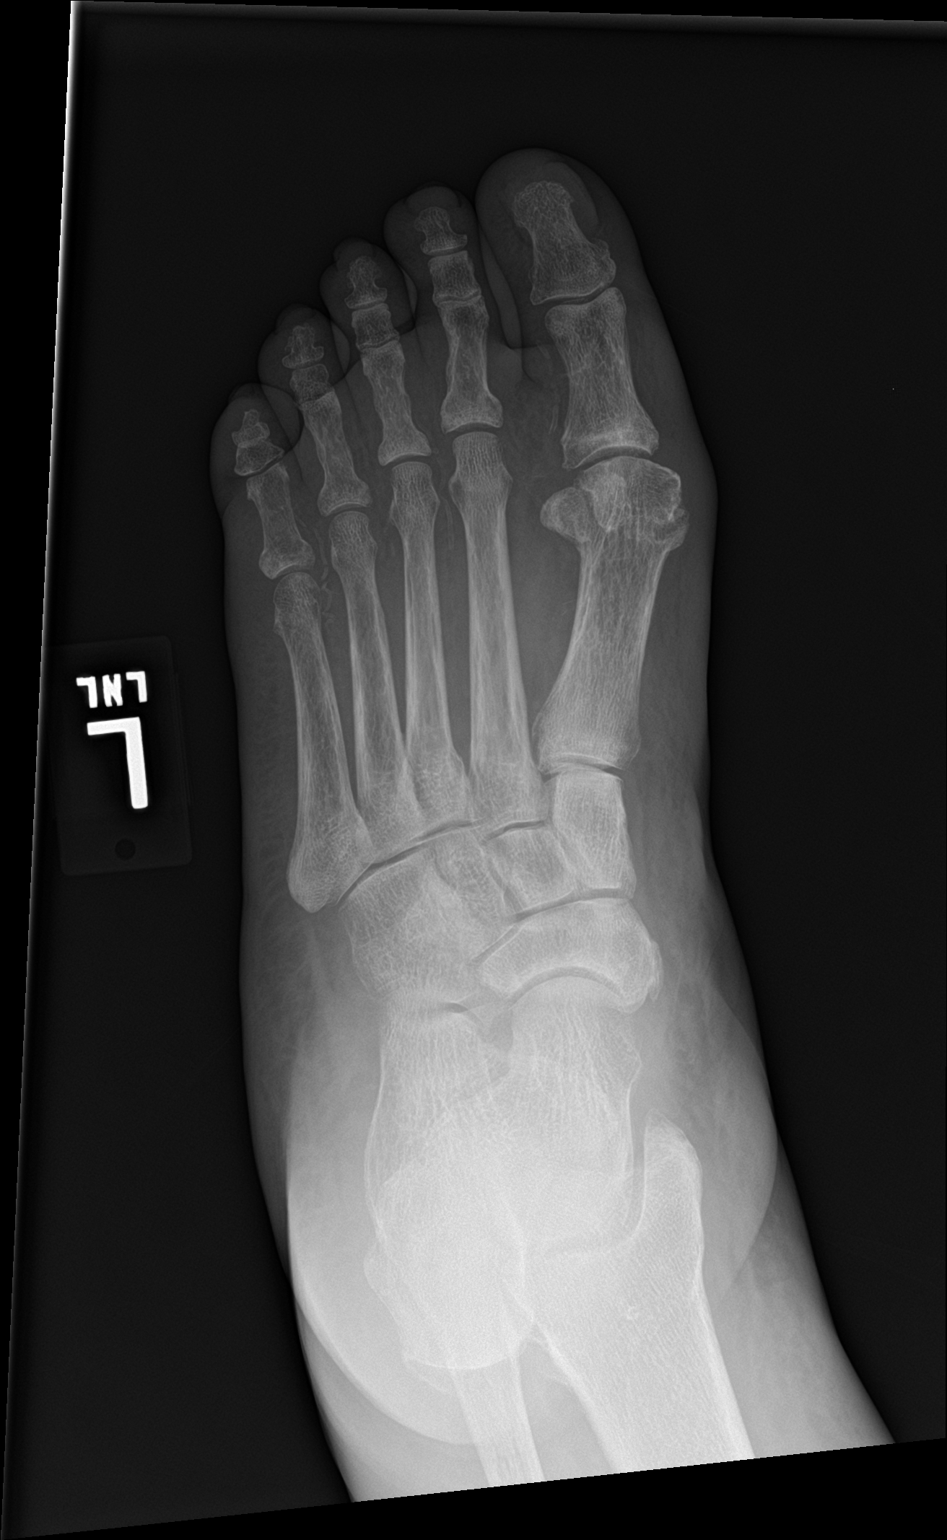

[foot obl]
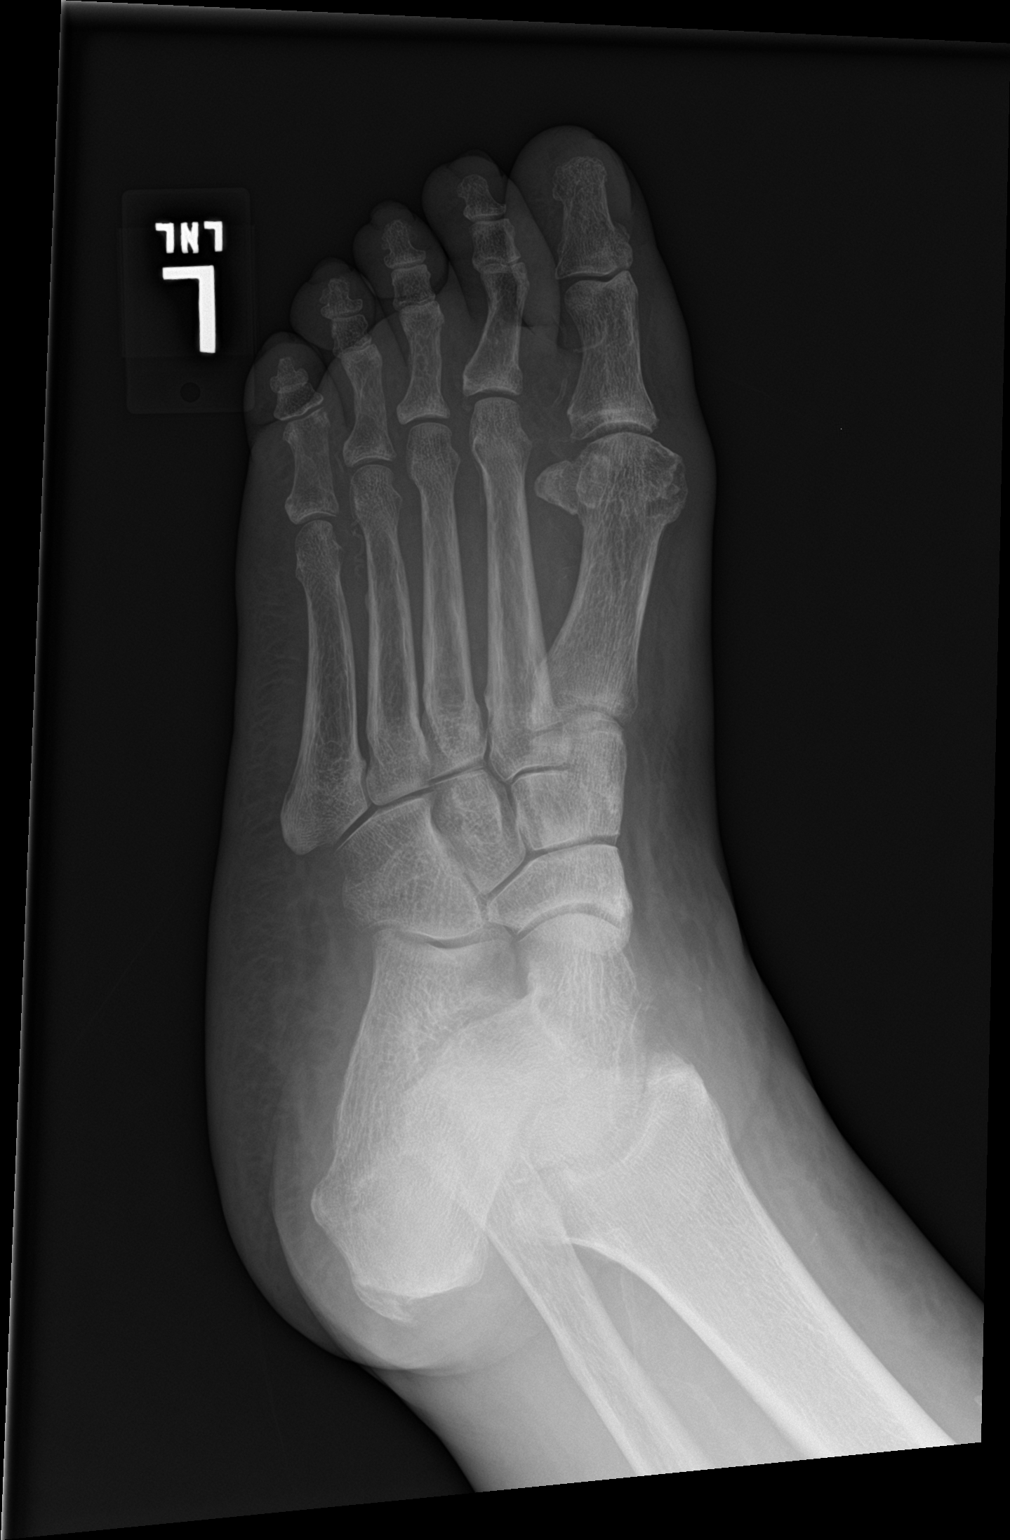

[foot lat]
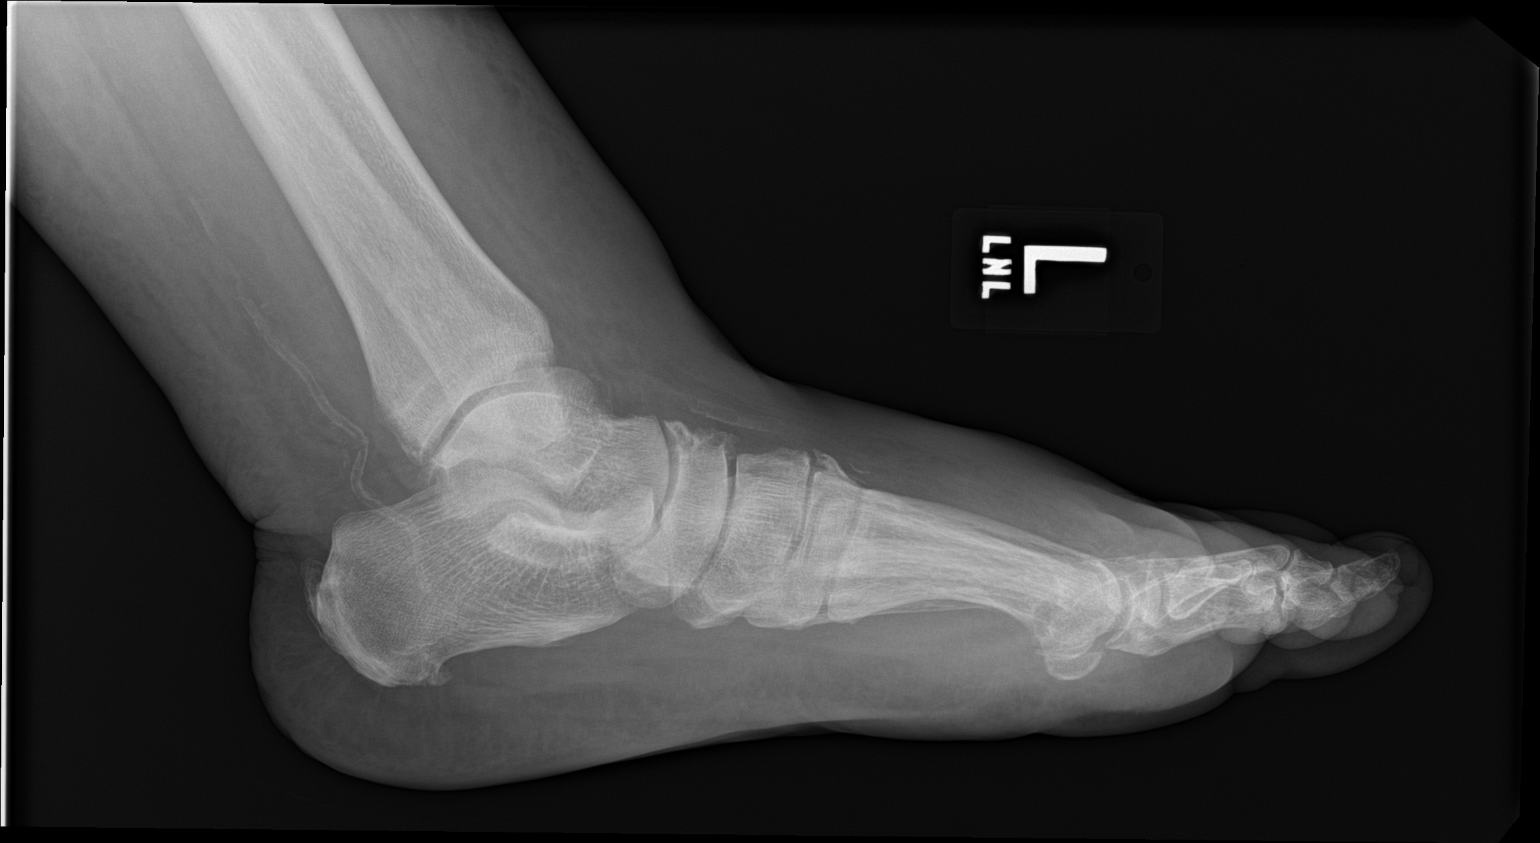

[3 of 3 positions shown; findings below may reference images not displayed]

FINDINGS: No fracture or dislocation is seen.

Degenerative changes the dorsal midfoot.

Small plantar and posterior calcaneal enthesophyte.

Vascular calcifications.

Mild soft tissue swelling/left lower extremity edema.
IMPRESSION: No acute osseus abnormality is seen.

## 2017-11-15 ENCOUNTER — Encounter (HOSPITAL_COMMUNITY): Payer: Medicaid Other

## 2017-11-20 ENCOUNTER — Ambulatory Visit: Payer: Medicaid Other | Admitting: *Deleted

## 2017-11-26 ENCOUNTER — Telehealth (HOSPITAL_COMMUNITY): Payer: Self-pay

## 2017-11-26 NOTE — Telephone Encounter (Signed)
Called pt to request appointment, she is still in [place] due to her sister passing a few weeks ago and she isnt sure when she will return and will call when she gets back. She states she is holding up well and is with family.   Marylouise Stacks, EMT-Paramedic  11/26/17

## 2017-12-03 ENCOUNTER — Other Ambulatory Visit: Payer: Self-pay | Admitting: Cardiovascular Disease

## 2017-12-17 ENCOUNTER — Telehealth (HOSPITAL_COMMUNITY): Payer: Self-pay

## 2017-12-17 NOTE — Telephone Encounter (Signed)
Called pt to sch last visit but no answer, left message on VM to return call. She was up in [place] visiting family and wasn't sure when she would be returning to [place].    Marylouise Stacks, EMT-Paramedic  12/17/17

## 2017-12-26 ENCOUNTER — Telehealth (HOSPITAL_COMMUNITY): Payer: Self-pay | Admitting: Surgery

## 2017-12-26 NOTE — Telephone Encounter (Signed)
Patient will be discharged from [place] [facility] due to inability to reach her and make home visit appts.

## 2018-01-01 ENCOUNTER — Encounter: Payer: Self-pay | Admitting: Podiatry

## 2018-01-01 ENCOUNTER — Encounter: Payer: Self-pay | Admitting: Specialist

## 2018-01-01 ENCOUNTER — Ambulatory Visit: Payer: Medicaid Other | Admitting: Podiatry

## 2018-01-01 DIAGNOSIS — E1142 Type 2 diabetes mellitus with diabetic polyneuropathy: Secondary | ICD-10-CM

## 2018-01-01 DIAGNOSIS — B351 Tinea unguium: Secondary | ICD-10-CM

## 2018-01-01 DIAGNOSIS — M79676 Pain in unspecified toe(s): Secondary | ICD-10-CM | POA: Diagnosis not present

## 2018-01-01 DIAGNOSIS — M79609 Pain in unspecified limb: Principal | ICD-10-CM

## 2018-01-01 NOTE — Progress Notes (Signed)
Complaint:  Visit Type: Patient returns to my office for continued preventative foot care services. Complaint: Patient states" my nails have grown long and thick and become painful to walk and wear shoes" Patient has been diagnosed with DM with neuropathy.. The patient presents for preventative foot care services. No changes to ROS  Podiatric Exam: Vascular: dorsalis pedis  are palpable bilateral. Posterior tibial pulses are absent  B/L due to swelling. Capillary return is immediate. Temperature gradient is WNL. Skin turgor WNL  Sensorium: Diminished  Semmes Weinstein monofilament test. Normal tactile sensation bilaterally. Nail Exam: Pt has thick disfigured discolored nails with subungual debris noted bilateral entire nail hallux through fifth toenails Ulcer Exam: There is no evidence of ulcer or pre-ulcerative changes or infection. Orthopedic Exam: Muscle tone and strength are WNL. No limitations in general ROM. No crepitus or effusions noted. Foot type and digits show no abnormalities  HAV 1st MPJ  B/L. Skin: No Porokeratosis. No infection or ulcers  Diagnosis:  Onychomycosis, , Pain in right toe, pain in left toes  Treatment & Plan Procedures and Treatment: Consent by patient was obtained for treatment procedures.   Debridement of mycotic and hypertrophic toenails, 1 through 5 bilateral and clearing of subungual debris. No ulceration, no infection noted.  Return Visit-Office Procedure: Patient instructed to return to the office for a follow up visit 3 months for continued evaluation and treatment.    Abdel Effinger DPM 

## 2018-01-23 ENCOUNTER — Ambulatory Visit (INDEPENDENT_AMBULATORY_CARE_PROVIDER_SITE_OTHER): Payer: Medicaid Other | Admitting: *Deleted

## 2018-01-23 ENCOUNTER — Ambulatory Visit: Payer: Medicaid Other

## 2018-01-23 VITALS — BP 146/82 | HR 57

## 2018-01-23 DIAGNOSIS — Z3202 Encounter for pregnancy test, result negative: Secondary | ICD-10-CM

## 2018-01-23 DIAGNOSIS — D259 Leiomyoma of uterus, unspecified: Secondary | ICD-10-CM | POA: Diagnosis not present

## 2018-01-23 DIAGNOSIS — Z01818 Encounter for other preprocedural examination: Secondary | ICD-10-CM

## 2018-01-23 DIAGNOSIS — D251 Intramural leiomyoma of uterus: Secondary | ICD-10-CM

## 2018-01-23 DIAGNOSIS — N939 Abnormal uterine and vaginal bleeding, unspecified: Secondary | ICD-10-CM

## 2018-01-23 LAB — POCT URINE PREGNANCY: PREG TEST UR: NEGATIVE

## 2018-01-23 NOTE — Progress Notes (Signed)
Pt is in office for Lupron injection. Pt UPT in office in Negative, pt is not sexually active. Reviewed with R.Denney, CNM- approved to give Lupron today.  Pt has no other concerns today.  Pt advised to follow up in 6-8 weeks with Dr Elly Modena.   BP (!) 146/82   Pulse (!) 57   LMP 01/09/2017   Administrations This Visit    leuprolide (LUPRON) injection 11.25 mg    Admin Date 01/23/2018 Action Given Dose 11.25 mg Route Intramuscular Administered By Valene Bors, CMA

## 2018-01-24 NOTE — Progress Notes (Signed)
I have reviewed the chart and agree with nursing staff's documentation of this patient's encounter.  Morene Crocker, CNM 01/24/2018 3:35 PM

## 2018-02-13 ENCOUNTER — Telehealth (HOSPITAL_COMMUNITY): Payer: Self-pay | Admitting: Surgery

## 2018-02-13 NOTE — Telephone Encounter (Signed)
Patient will be discharged from the [facility]  Program.  She has successfully completed the program.  She is aware and can call the HF Clinic as needed for any concerns or questions related to HF. 

## 2018-03-04 ENCOUNTER — Ambulatory Visit: Payer: Medicaid Other | Admitting: Obstetrics and Gynecology

## 2018-03-04 ENCOUNTER — Encounter: Payer: Self-pay | Admitting: Obstetrics and Gynecology

## 2018-03-04 VITALS — BP 126/69 | HR 61 | Wt 251.4 lb

## 2018-03-04 DIAGNOSIS — N950 Postmenopausal bleeding: Secondary | ICD-10-CM

## 2018-03-04 NOTE — Progress Notes (Signed)
Pt is here for FU visit after Lupron inj done 01/23/18. Pt states the bleeding has stopped since a couple days after the first injection.

## 2018-03-04 NOTE — Progress Notes (Signed)
62 yo G1P1 here for follow up following depo-lupron on 5/8. Patient had an episode of postmenopausal vaginal bleeding in August 2018. She reports a few days of vaginal spotting following depo-lupron and nothing since. She is without complaints. She is not sexually active  Past Medical History:  Diagnosis Date  . Achilles rupture   . Anxiety   . Arthritis   . Asthma   . Chronic back pain   . Chronic headaches   . Chronic leg pain    bilaterally  . Depression   . Diabetes mellitus 1999   T2DM  . Glaucoma   . H/O gestational diabetes mellitus, not currently pregnant   . Hyperlipidemia   . Hypertension   . Morbid obesity ([place])   . OSA (obstructive sleep apnea)   . Sciatica    Past Surgical History:  Procedure Laterality Date  . BACK SURGERY    . BREAST LUMPECTOMY     Right breast  . CERVICAL SPINE SURGERY    . CESAREAN SECTION  12/10/89  . CHOLECYSTECTOMY  1991  . LUMBAR SPINE SURGERY    . RIGHT HEART CATH N/A 12/06/2016   Procedure: Right Heart Cath;  Surgeon: Larey Dresser, MD;  Location: [facility];  Service: Cardiovascular;  Laterality: N/A;  . TUBAL LIGATION     Family History  Problem Relation Age of Onset  . Heart failure Mother   . Renal Disease Mother    Social History   Tobacco Use  . Smoking status: Former Smoker    Types: Cigarettes    Last attempt to quit: 09/04/1998    Years since quitting: 19.5  . Smokeless tobacco: Never Used  Substance Use Topics  . Alcohol use: Yes    Comment: occasional  . Drug use: No   ROS See pertinent in HPI  Blood pressure 126/69, pulse 61, weight 251 lb 6.4 oz (114 kg), last menstrual period 02/16/2017. GENERAL: Well-developed, well-nourished female in no acute distress.  NEURO: alert and oriented x3  A/P 62 yo with h/o postmenopausal vaginal bleeding - Patient is doing well - Advised to keep a menstrual calendar - RTC in August for annual exam

## 2018-04-02 ENCOUNTER — Ambulatory Visit: Payer: Medicaid Other | Admitting: Podiatry

## 2018-04-02 ENCOUNTER — Encounter: Payer: Self-pay | Admitting: Podiatry

## 2018-04-02 DIAGNOSIS — M79676 Pain in unspecified toe(s): Secondary | ICD-10-CM | POA: Diagnosis not present

## 2018-04-02 DIAGNOSIS — E1142 Type 2 diabetes mellitus with diabetic polyneuropathy: Secondary | ICD-10-CM

## 2018-04-02 DIAGNOSIS — M79609 Pain in unspecified limb: Principal | ICD-10-CM

## 2018-04-02 DIAGNOSIS — B351 Tinea unguium: Secondary | ICD-10-CM | POA: Diagnosis not present

## 2018-04-02 DIAGNOSIS — E1151 Type 2 diabetes mellitus with diabetic peripheral angiopathy without gangrene: Secondary | ICD-10-CM

## 2018-04-02 NOTE — Progress Notes (Signed)
Complaint:  Visit Type: Patient returns to my office for continued preventative foot care services. Complaint: Patient states" my nails have grown long and thick and become painful to walk and wear shoes" Patient has been diagnosed with DM with neuropathy.. The patient presents for preventative foot care services. No changes to ROS  Podiatric Exam: Vascular: dorsalis pedis  are palpable bilateral. Posterior tibial pulses are absent  B/L due to swelling. Capillary return is immediate. Temperature gradient is WNL. Skin turgor WNL  Sensorium: Diminished  Semmes Weinstein monofilament test. Normal tactile sensation bilaterally. Nail Exam: Pt has thick disfigured discolored nails with subungual debris noted bilateral entire nail hallux through fifth toenails Ulcer Exam: There is no evidence of ulcer or pre-ulcerative changes or infection. Orthopedic Exam: Muscle tone and strength are WNL. No limitations in general ROM. No crepitus or effusions noted. Foot type and digits show no abnormalities  HAV 1st MPJ  B/L. Skin: No Porokeratosis. No infection or ulcers  Diagnosis:  Onychomycosis, , Pain in right toe, pain in left toes  Treatment & Plan Procedures and Treatment: Consent by patient was obtained for treatment procedures.   Debridement of mycotic and hypertrophic toenails, 1 through 5 bilateral and clearing of subungual debris. No ulceration, no infection noted.  Return Visit-Office Procedure: Patient instructed to return to the office for a follow up visit 3 months for continued evaluation and treatment.    Gardiner Barefoot DPM

## 2018-04-09 ENCOUNTER — Encounter (HOSPITAL_COMMUNITY): Payer: Self-pay | Admitting: Emergency Medicine

## 2018-04-09 ENCOUNTER — Other Ambulatory Visit: Payer: Self-pay

## 2018-04-09 ENCOUNTER — Emergency Department (HOSPITAL_COMMUNITY)
Admission: EM | Admit: 2018-04-09 | Discharge: 2018-04-09 | Disposition: A | Discharge status: Home / Self Care | Payer: Medicaid Other | Attending: Emergency Medicine | Admitting: Emergency Medicine

## 2018-04-09 DIAGNOSIS — J45909 Unspecified asthma, uncomplicated: Secondary | ICD-10-CM | POA: Insufficient documentation

## 2018-04-09 DIAGNOSIS — N183 Chronic kidney disease, stage 3 (moderate): Secondary | ICD-10-CM | POA: Diagnosis not present

## 2018-04-09 DIAGNOSIS — Z87891 Personal history of nicotine dependence: Secondary | ICD-10-CM | POA: Insufficient documentation

## 2018-04-09 DIAGNOSIS — Z7982 Long term (current) use of aspirin: Secondary | ICD-10-CM | POA: Diagnosis not present

## 2018-04-09 DIAGNOSIS — Z79899 Other long term (current) drug therapy: Secondary | ICD-10-CM | POA: Insufficient documentation

## 2018-04-09 DIAGNOSIS — Z0189 Encounter for other specified special examinations: Secondary | ICD-10-CM

## 2018-04-09 DIAGNOSIS — I129 Hypertensive chronic kidney disease with stage 1 through stage 4 chronic kidney disease, or unspecified chronic kidney disease: Secondary | ICD-10-CM | POA: Diagnosis not present

## 2018-04-09 DIAGNOSIS — Z0000 Encounter for general adult medical examination without abnormal findings: Secondary | ICD-10-CM | POA: Diagnosis not present

## 2018-04-09 DIAGNOSIS — Z794 Long term (current) use of insulin: Secondary | ICD-10-CM | POA: Diagnosis not present

## 2018-04-09 DIAGNOSIS — E1122 Type 2 diabetes mellitus with diabetic chronic kidney disease: Secondary | ICD-10-CM | POA: Insufficient documentation

## 2018-04-09 LAB — COMPREHENSIVE METABOLIC PANEL
ALT: 26 U/L (ref 0–44)
ANION GAP: 10 (ref 5–15)
AST: 25 U/L (ref 15–41)
Albumin: 3.8 g/dL (ref 3.5–5.0)
Alkaline Phosphatase: 61 U/L (ref 38–126)
BILIRUBIN TOTAL: 0.7 mg/dL (ref 0.3–1.2)
BUN: 35 mg/dL — ABNORMAL HIGH (ref 8–23)
CO2: 25 mmol/L (ref 22–32)
Calcium: 9.2 mg/dL (ref 8.9–10.3)
Chloride: 107 mmol/L (ref 98–111)
Creatinine, Ser: 1.54 mg/dL — ABNORMAL HIGH (ref 0.44–1.00)
GFR calc non Af Amer: 35 mL/min — ABNORMAL LOW (ref >60)
GFR, EST AFRICAN AMERICAN: 41 mL/min — AB (ref >60)
Glucose, Bld: 171 mg/dL — ABNORMAL HIGH (ref 70–99)
POTASSIUM: 4 mmol/L (ref 3.5–5.1)
Sodium: 142 mmol/L (ref 135–145)
TOTAL PROTEIN: 7.3 g/dL (ref 6.5–8.1)

## 2018-04-09 LAB — RAPID HIV SCREEN (HIV 1/2 AB+AG)
HIV 1/2 ANTIBODIES: NONREACTIVE
HIV-1 P24 ANTIGEN - HIV24: NONREACTIVE

## 2018-04-09 NOTE — ED Provider Notes (Signed)
[facility]ER[facility]PART[facility] Provider Note   CSN: [id number] Arrival date & time: 04/09/18  1726     History   Chief Complaint Chief Complaint  Patient presents with  . Labs Only    HPI [name] is a 62 y.o. female.  Pt was at her eye doctor office and employee stuck herself with pt's insulin pen.  Pt was sent here to have her blood drawn.  Pt denies any complaints.    The history is provided by the patient. No language interpreter was used.    Past Medical History:  Diagnosis Date  . Achilles rupture   . Anxiety   . Arthritis   . Asthma   . Chronic back pain   . Chronic headaches   . Chronic leg pain    bilaterally  . Depression   . Diabetes mellitus 1999   T2DM  . Glaucoma   . H/O gestational diabetes mellitus, not currently pregnant   . Hyperlipidemia   . Hypertension   . Morbid obesity ([place])   . OSA (obstructive sleep apnea)   . Sciatica     Patient Active Problem List   Diagnosis Date Noted  . Nausea & vomiting 07/01/2017  . Acute gout due to renal impairment involving left ankle   . Hypertensive heart and renal disease   . SOB (shortness of breath)   . Chronic diastolic heart failure ([place]) 11/30/2016  . CKD (chronic kidney disease) stage 3, GFR 30-59 ml/min (HCC) 11/30/2016  . Thrombocytopenia ([place]) 11/30/2016  . Sinus bradycardia 11/30/2016  . PMB (postmenopausal bleeding) 05/09/2010  . ANEMIA 05/05/2010  . DEPRESSION 04/26/2010  . TMJ SYNDROME 04/26/2010  . DEGENERATIVE DISC DISEASE, CERVICAL SPINE 04/26/2010  . CHEST PAIN 04/26/2010  . CHRONIC OBSTRUCTIVE PULMONARY DISEASE, MODERATE 03/26/2010  . MASTALGIA 10/08/2009  . Iron deficiency anemia 07/12/2009  . Diabetes mellitus ([place]) 07/02/2009  . Hyperlipidemia 07/02/2009  . BMI 45.0-49.9, adult ([place]) 07/02/2009  . OSA (obstructive sleep apnea) 07/02/2009  . Glaucoma 07/02/2009  . Hypertension 07/02/2009  . ASTHMA 07/02/2009  . LOW BACK PAIN SYNDROME 07/02/2009    . BACK PAIN 07/02/2009  . PERIPHERAL EDEMA 07/02/2009  . GROIN PAIN 07/02/2009  . TOBACCO USE, QUIT 07/02/2009  . HEMORRHOIDS, INTERNAL 07/16/2008  . DIVERTICULOSIS OF COLON 07/16/2008    Past Surgical History:  Procedure Laterality Date  . BACK SURGERY    . BREAST LUMPECTOMY     Right breast  . CERVICAL SPINE SURGERY    . CESAREAN SECTION  12/10/89  . CHOLECYSTECTOMY  1991  . LUMBAR SPINE SURGERY    . RIGHT HEART CATH N/A 12/06/2016   Procedure: Right Heart Cath;  Surgeon: Larey Dresser, MD;  Location: [facility];  Service: Cardiovascular;  Laterality: N/A;  . TUBAL LIGATION       OB History    Gravida  1   Para  1   Term      Preterm      AB      Living  1     SAB      TAB      Ectopic      Multiple      Live Births               Home Medications    Prior to Admission medications   Medication Sig Start Date End Date Taking? Authorizing Provider  acetaminophen (TYLENOL) 500 MG tablet Take 500-1,000 mg by mouth every 6 (  six) hours as needed for mild pain or headache.     [provider]  albuterol (PROVENTIL HFA;VENTOLIN HFA) 108 (90 BASE) MCG/ACT inhaler Inhale 2 puffs into the lungs every 6 (six) hours as needed for wheezing or shortness of breath.     Robyn Haber, MD  aspirin EC 81 MG tablet Take 81 mg by mouth daily with breakfast.    [provider]  atorvastatin (LIPITOR) 10 MG tablet Take 1 tablet (10 mg total) by mouth daily. 09/13/17 01/23/18  Adora Fridge, RPH-CPP  colchicine 0.6 MG tablet Take 0.6 mg by mouth daily.    [provider]  dorzolamide-timolol (COSOPT) 22.3-6.8 MG/ML ophthalmic solution Place 1 drop into both eyes 2 (two) times daily.    [provider]  doxazosin (CARDURA) 4 MG tablet Take 4 mg by mouth at bedtime.    [provider]  HYDROcodone-acetaminophen (NORCO/VICODIN) 5-325 MG tablet Take 1 tablet by mouth every 4 (four) hours as needed for moderate  pain. Patient not taking: Reported on 03/04/2018 07/04/17   Geradine Girt, DO  insulin aspart (NOVOLOG FLEXPEN) 100 UNIT/ML FlexPen Inject 3-20 Units into the skin 3 (three) times daily as needed for high blood sugar (CBG 150).    [provider]  insulin glargine (LANTUS) 100 unit/mL SOPN Inject 50 Units into the skin daily before breakfast.    [provider]  leuprolide (LUPRON DEPOT, 93-MONTH,) 11.25 MG injection Inject 11.25 mg into the muscle every 3 (three) months. 10/03/17   Constant, Peggy, MD  liraglutide (VICTOZA) 18 MG/3ML SOPN Inject into the skin.    [provider]  lisinopril (PRINIVIL,ZESTRIL) 5 MG tablet Take 5 mg by mouth daily with breakfast.  06/18/17   [provider]  metolazone (ZAROXOLYN) 2.5 MG tablet Take 1 tablet (2.5 mg total) by mouth as directed. Must call CHF clinic before taking! [phone]). 10/09/17 01/07/18  Shirley Friar, PA-C  PRESCRIPTION MEDICATION Inhale into the lungs at bedtime. CPAP    [provider]  tiZANidine (ZANAFLEX) 4 MG tablet Take 4 mg by mouth every 12 (twelve) hours as needed for muscle spasms. Not to exceed 3 doses in 24 hours 06/22/17   [provider]  torsemide (DEMADEX) 20 MG tablet Take 4 tablets (80 mg total) by mouth daily. Patient not taking: Reported on 03/04/2018 10/22/17   Shirley Friar, PA-C  torsemide (DEMADEX) 20 MG tablet Take 3 tablets (60 mg total) by mouth as directed. Take 60 mg in the AM. You may take 60 mg in the PM as needed for swelling Patient not taking: Reported on 03/04/2018 12/04/17   Josue Hector, MD  Travoprost, BAK Free, (TRAVATAN) 0.004 % SOLN ophthalmic solution Place 1 drop into both eyes at bedtime.    [provider]  Vitamin D, Ergocalciferol, (DRISDOL) 50000 units CAPS capsule Take 1 capsule (50,000 Units total) by mouth every 7 (seven) days. 09/13/17   Adora Fridge, RPH-CPP    Family History Family History  Problem  Relation Age of Onset  . Heart failure Mother   . Renal Disease Mother     Social History Social History   Tobacco Use  . Smoking status: Former Smoker    Types: Cigarettes    Last attempt to quit: 09/04/1998    Years since quitting: 19.6  . Smokeless tobacco: Never Used  Substance Use Topics  . Alcohol use: Yes    Comment: occasional  . Drug use: No  Allergies   Pork allergy; Pork-derived products; and Whey   Review of Systems Review of Systems  All other systems reviewed and are negative.    Physical Exam Updated Vital Signs BP (!) 157/60 (BP Location: Right Arm)   Pulse (!) 56   Temp 98.2 F (36.8 C) (Oral)   Resp 18   Ht 5\' 1"  (1.549 m)   Wt 111.1 kg (245 lb)   LMP 02/16/2017 (Within Weeks)   SpO2 100%   BMI 46.29 kg/m   Physical Exam  Constitutional: She is oriented to person, place, and time. She appears well-developed and well-nourished.  HENT:  Head: Normocephalic.  Neck: Normal range of motion.  Cardiovascular: Normal rate.  Pulmonary/Chest: Effort normal.  Abdominal: She exhibits no distension.  Musculoskeletal: Normal range of motion.  Neurological: She is alert and oriented to person, place, and time.  Nursing note and vitals reviewed.    ED Treatments / Results  Labs (all labs ordered are listed, but only abnormal results are displayed) Labs Reviewed  RAPID HIV SCREEN (HIV 1/2 AB+AG)  COMPREHENSIVE METABOLIC PANEL  HEPATITIS C ANTIBODY  HEPATITIS B SURFACE ANTIGEN    EKG None  Radiology No results found.  Procedures Procedures (including critical care time)  Medications Ordered in ED Medications - No data to display   Initial Impression / Assessment and Plan / ED Course  I have reviewed the triage vital signs and the nursing notes.  Pertinent labs & imaging results that were available during my care of the patient were reviewed by me and considered in my medical decision making (see chart for details).  Clinical  Course as of Apr 09 1810  Tue Apr 09, 2018  1759 RPR [LS]    Clinical Course User Index [LS] Fransico Meadow, [place]    Pt will obtain results and follow up with opthomology.  Final Clinical Impressions(s) / ED Diagnoses   Final diagnoses:  Encounter for routine laboratory testing    ED Discharge Orders    None    An After Visit Summary was printed and given to the patient.    Fransico Meadow, [place] 04/09/18 1818    Tegeler, Gwenyth Allegra, MD 04/10/18 [phone]

## 2018-04-09 NOTE — ED Triage Notes (Signed)
Pt presents with orders from her optho office for exposure blood work; pts insulin pen with needle exposed and office employee was stuck by the patients needle; optho office sent patient here for bloodwork; pt has phone number if there are any questions regarding orders or payment info

## 2018-04-09 NOTE — ED Provider Notes (Signed)
Patient placed in Quick Look pathway, seen and evaluated   Chief Complaint: Pt is a diabetic.  Employee at her [employer] was stuck by pt's insulin pen.  Pt denies  hiv or hepatitis.   HPI:     ROS:   Physical Exam:   Gen: No distress  Neuro: Awake and Alert  Skin: Warm    Focused Exam:    Initiation of care has begun. The patient has been counseled on the process, plan, and necessity for staying for the completion/evaluation, and the remainder of the medical screening examination  Pt seen and discharged by me.  See full note   Sidney Ace 04/09/18 1811    Tegeler, Gwenyth Allegra, MD 04/10/18 [phone]

## 2018-04-09 NOTE — Discharge Instructions (Addendum)
Return if any problems. Your labs are pending

## 2018-04-10 LAB — HEPATITIS C ANTIBODY: HCV Ab: <0.1 s/co ratio (ref 0.0–0.9)

## 2018-04-10 LAB — HEPATITIS B SURFACE ANTIGEN: Hepatitis B Surface Ag: NEGATIVE

## 2018-04-23 ENCOUNTER — Telehealth: Payer: Self-pay

## 2018-04-23 NOTE — Telephone Encounter (Signed)
Pt called stating that she needs a new rx sent in for her Lupron injections sent to [facility]. Please review for refill.

## 2018-04-24 ENCOUNTER — Other Ambulatory Visit: Payer: Self-pay | Admitting: Obstetrics and Gynecology

## 2018-04-24 ENCOUNTER — Emergency Department (HOSPITAL_COMMUNITY): Payer: Medicaid Other

## 2018-04-24 ENCOUNTER — Emergency Department (HOSPITAL_COMMUNITY)
Admission: EM | Admit: 2018-04-24 | Discharge: 2018-04-24 | Disposition: A | Discharge status: Home / Self Care | Payer: Medicaid Other | Attending: Emergency Medicine | Admitting: Emergency Medicine

## 2018-04-24 DIAGNOSIS — Z794 Long term (current) use of insulin: Secondary | ICD-10-CM | POA: Insufficient documentation

## 2018-04-24 DIAGNOSIS — M10072 Idiopathic gout, left ankle and foot: Secondary | ICD-10-CM | POA: Insufficient documentation

## 2018-04-24 DIAGNOSIS — I130 Hypertensive heart and chronic kidney disease with heart failure and stage 1 through stage 4 chronic kidney disease, or unspecified chronic kidney disease: Secondary | ICD-10-CM | POA: Insufficient documentation

## 2018-04-24 DIAGNOSIS — I5032 Chronic diastolic (congestive) heart failure: Secondary | ICD-10-CM | POA: Insufficient documentation

## 2018-04-24 DIAGNOSIS — J449 Chronic obstructive pulmonary disease, unspecified: Secondary | ICD-10-CM | POA: Insufficient documentation

## 2018-04-24 DIAGNOSIS — Z87891 Personal history of nicotine dependence: Secondary | ICD-10-CM | POA: Insufficient documentation

## 2018-04-24 DIAGNOSIS — E1122 Type 2 diabetes mellitus with diabetic chronic kidney disease: Secondary | ICD-10-CM | POA: Insufficient documentation

## 2018-04-24 DIAGNOSIS — N183 Chronic kidney disease, stage 3 (moderate): Secondary | ICD-10-CM | POA: Diagnosis not present

## 2018-04-24 DIAGNOSIS — M25572 Pain in left ankle and joints of left foot: Secondary | ICD-10-CM | POA: Diagnosis present

## 2018-04-24 MED ORDER — PREDNISONE 20 MG PO TABS: 20.0000 mg | ORAL_TABLET | Freq: Every day | ORAL | 0 refills | Status: DC

## 2018-04-24 MED ORDER — HYDROCODONE-ACETAMINOPHEN 5-325 MG PO TABS: 1.0000 | ORAL_TABLET | Freq: Four times a day (QID) | ORAL | 0 refills | Status: DC | PRN

## 2018-04-24 MED ORDER — OXYCODONE-ACETAMINOPHEN 5-325 MG PO TABS
1.0000 | ORAL_TABLET | ORAL | Status: DC | PRN
  Administered 2018-04-24: 1 via ORAL
  Filled 2018-04-24: qty 1

## 2018-04-24 NOTE — Discharge Instructions (Addendum)
Return here as needed. Watch your blood sugars closely. Follow up with your doctor.

## 2018-04-24 NOTE — ED Notes (Signed)
Pt normally walks with walker- limps on right leg normally, states left ankle hurts so bad she is unable to place weight on it. Will not be able to ambulate into house without help- walker is inside. Will call [facility]

## 2018-04-24 NOTE — Telephone Encounter (Signed)
Refill called in to specialty pharmacy with no refills

## 2018-04-24 NOTE — ED Notes (Signed)
[facility] called @ 0940-per Santiago Glad, RN-called by Levada Dy

## 2018-04-24 NOTE — ED Triage Notes (Signed)
Patient c/o left foot, "gout pain". States that she ran out of gout medicine a few days ago, then restarted taking meds but has had no relief.

## 2018-04-24 NOTE — ED Notes (Signed)
Patient transported to X-ray 

## 2018-04-27 NOTE — ED Provider Notes (Signed)
[facility] EMERGENCY DEPARTMENT Provider Note   CSN: [id number] Arrival date & time: 04/24/18  0536     History   Chief Complaint Chief Complaint  Patient presents with  . Foot Pain    HPI [name] is a 62 y.o. female.  HPI Patient presents to the emergency department with pain in her left ankle.  The patient denies any trauma.  She states it is very sensitive to the touch and certain movements.  Patient states she does have an extensive history of gout and had been without her medications for several weeks.  Patient states that movements and palpation make the pain worse.  The patient denies chest pain, shortness of breath, headache,blurred vision, neck pain, fever, cough, weakness, numbness, dizziness, anorexia, edema, abdominal pain, nausea, vomiting, diarrhea, rash, back pain, dysuria, hematemesis, bloody stool, near syncope, or syncope. Past Medical History:  Diagnosis Date  . Achilles rupture   . Anxiety   . Arthritis   . Asthma   . Chronic back pain   . Chronic headaches   . Chronic leg pain    bilaterally  . Depression   . Diabetes mellitus 1999   T2DM  . Glaucoma   . H/O gestational diabetes mellitus, not currently pregnant   . Hyperlipidemia   . Hypertension   . Morbid obesity ([place])   . OSA (obstructive sleep apnea)   . Sciatica     Patient Active Problem List   Diagnosis Date Noted  . Nausea & vomiting 07/01/2017  . Acute gout due to renal impairment involving left ankle   . Hypertensive heart and renal disease   . SOB (shortness of breath)   . Chronic diastolic heart failure ([place]) 11/30/2016  . CKD (chronic kidney disease) stage 3, GFR 30-59 ml/min (HCC) 11/30/2016  . Thrombocytopenia ([place]) 11/30/2016  . Sinus bradycardia 11/30/2016  . PMB (postmenopausal bleeding) 05/09/2010  . ANEMIA 05/05/2010  . DEPRESSION 04/26/2010  . TMJ SYNDROME 04/26/2010  . DEGENERATIVE DISC DISEASE, CERVICAL SPINE 04/26/2010  . CHEST PAIN  04/26/2010  . CHRONIC OBSTRUCTIVE PULMONARY DISEASE, MODERATE 03/26/2010  . MASTALGIA 10/08/2009  . Iron deficiency anemia 07/12/2009  . Diabetes mellitus ([place]) 07/02/2009  . Hyperlipidemia 07/02/2009  . BMI 45.0-49.9, adult ([place]) 07/02/2009  . OSA (obstructive sleep apnea) 07/02/2009  . Glaucoma 07/02/2009  . Hypertension 07/02/2009  . ASTHMA 07/02/2009  . LOW BACK PAIN SYNDROME 07/02/2009  . BACK PAIN 07/02/2009  . PERIPHERAL EDEMA 07/02/2009  . GROIN PAIN 07/02/2009  . TOBACCO USE, QUIT 07/02/2009  . HEMORRHOIDS, INTERNAL 07/16/2008  . DIVERTICULOSIS OF COLON 07/16/2008    Past Surgical History:  Procedure Laterality Date  . BACK SURGERY    . BREAST LUMPECTOMY     Right breast  . CERVICAL SPINE SURGERY    . CESAREAN SECTION  12/10/89  . CHOLECYSTECTOMY  1991  . LUMBAR SPINE SURGERY    . RIGHT HEART CATH N/A 12/06/2016   Procedure: Right Heart Cath;  Surgeon: Larey Dresser, MD;  Location: [facility];  Service: Cardiovascular;  Laterality: N/A;  . TUBAL LIGATION       OB History    Gravida  1   Para  1   Term      Preterm      AB      Living  1     SAB      TAB      Ectopic      Multiple  Live Births               Home Medications    Prior to Admission medications   Medication Sig Start Date End Date Taking? Authorizing Provider  acetaminophen (TYLENOL) 500 MG tablet Take 500-1,000 mg by mouth every 6 (six) hours as needed for mild pain or headache.     [provider]  albuterol (PROVENTIL HFA;VENTOLIN HFA) 108 (90 BASE) MCG/ACT inhaler Inhale 2 puffs into the lungs every 6 (six) hours as needed for wheezing or shortness of breath.     Robyn Haber, MD  aspirin EC 81 MG tablet Take 81 mg by mouth daily with breakfast.    [provider]  atorvastatin (LIPITOR) 10 MG tablet Take 1 tablet (10 mg total) by mouth daily. 09/13/17 01/23/18  Adora Fridge, RPH-CPP  colchicine 0.6 MG tablet Take 0.6 mg by mouth  daily.    [provider]  dorzolamide-timolol (COSOPT) 22.3-6.8 MG/ML ophthalmic solution Place 1 drop into both eyes 2 (two) times daily.    [provider]  doxazosin (CARDURA) 4 MG tablet Take 4 mg by mouth at bedtime.    [provider]  HYDROcodone-acetaminophen (NORCO/VICODIN) 5-325 MG tablet Take 1 tablet by mouth every 6 (six) hours as needed for moderate pain. 04/24/18   Barre Aydelott, Harrell Gave, PA-C  insulin aspart (NOVOLOG FLEXPEN) 100 UNIT/ML FlexPen Inject 3-20 Units into the skin 3 (three) times daily as needed for high blood sugar (CBG 150).    [provider]  insulin glargine (LANTUS) 100 unit/mL SOPN Inject 50 Units into the skin daily before breakfast.    [provider]  leuprolide (LUPRON DEPOT, 23-MONTH,) 11.25 MG injection Inject 11.25 mg into the muscle every 3 (three) months. 10/03/17   Constant, Peggy, MD  liraglutide (VICTOZA) 18 MG/3ML SOPN Inject into the skin.    [provider]  lisinopril (PRINIVIL,ZESTRIL) 5 MG tablet Take 5 mg by mouth daily with breakfast.  06/18/17   [provider]  metolazone (ZAROXOLYN) 2.5 MG tablet Take 1 tablet (2.5 mg total) by mouth as directed. Must call CHF clinic before taking! [phone]). 10/09/17 01/07/18  Shirley Friar, PA-C  predniSONE (DELTASONE) 20 MG tablet Take 1 tablet (20 mg total) by mouth daily. 04/24/18   Murlin Schrieber, Harrell Gave, PA-C  PRESCRIPTION MEDICATION Inhale into the lungs at bedtime. CPAP    [provider]  tiZANidine (ZANAFLEX) 4 MG tablet Take 4 mg by mouth every 12 (twelve) hours as needed for muscle spasms. Not to exceed 3 doses in 24 hours 06/22/17   [provider]  torsemide (DEMADEX) 20 MG tablet Take 4 tablets (80 mg total) by mouth daily. Patient not taking: Reported on 03/04/2018 10/22/17   Shirley Friar, PA-C  torsemide (DEMADEX) 20 MG tablet Take 3 tablets (60 mg total) by mouth as directed. Take 60 mg in the AM. You  may take 60 mg in the PM as needed for swelling Patient not taking: Reported on 03/04/2018 12/04/17   Josue Hector, MD  Travoprost, BAK Free, (TRAVATAN) 0.004 % SOLN ophthalmic solution Place 1 drop into both eyes at bedtime.    [provider]  Vitamin D, Ergocalciferol, (DRISDOL) 50000 units CAPS capsule Take 1 capsule (50,000 Units total) by mouth every 7 (seven) days. 09/13/17   Adora Fridge, RPH-CPP    Family History Family History  Problem Relation Age of Onset  . Heart failure Mother   . Renal Disease Mother  Social History Social History   Tobacco Use  . Smoking status: Former Smoker    Types: Cigarettes    Last attempt to quit: 09/04/1998    Years since quitting: 19.6  . Smokeless tobacco: Never Used  Substance Use Topics  . Alcohol use: Yes    Comment: occasional  . Drug use: No     Allergies   Pork allergy; Pork-derived products; and Whey   Review of Systems Review of Systems All other systems negative except as documented in the HPI. All pertinent positives and negatives as reviewed in the HPI.  Physical Exam Updated Vital Signs BP (!) 132/52   Pulse 65   Temp 98.2 F (36.8 C) (Oral)   Resp 16   Ht 5' 1.25" (1.556 m)   Wt 111.1 kg   LMP 02/16/2017 (Within Weeks) Comment: tubal ligation  SpO2 100%   BMI 45.92 kg/m   Physical Exam  Constitutional: She is oriented to person, place, and time. She appears well-developed and well-nourished. No distress.  HENT:  Head: Normocephalic and atraumatic.  Eyes: Pupils are equal, round, and reactive to light.  Pulmonary/Chest: Effort normal.  Musculoskeletal:       Left ankle: She exhibits decreased range of motion and swelling. She exhibits no ecchymosis, no deformity, no laceration and normal pulse. Tenderness. Achilles tendon normal.       Feet:  Neurological: She is alert and oriented to person, place, and time.  Skin: Skin is warm and dry.  Psychiatric: She has a normal mood and  affect.  Nursing note and vitals reviewed.    ED Treatments / Results  Labs (all labs ordered are listed, but only abnormal results are displayed) Labs Reviewed - No data to display  EKG None  Radiology No results found.  Procedures Procedures (including critical care time)  Medications Ordered in ED Medications - No data to display   Initial Impression / Assessment and Plan / ED Course  I have reviewed the triage vital signs and the nursing notes.  Pertinent labs & imaging results that were available during my care of the patient were reviewed by me and considered in my medical decision making (see chart for details).     I feel that this is a gout type flare in the left ankle.  Have advised her to ice and elevate the ankle told to return here as needed.  Patient agrees the plan and all questions were answered.  I advised her to follow-up with her primary doctor as well.  Did advise her that she needs to closely monitor her blood sugars while on the dosage of prednisone.  Final Clinical Impressions(s) / ED Diagnoses   Final diagnoses:  Acute idiopathic gout of left ankle    ED Discharge Orders         Ordered    predniSONE (DELTASONE) 20 MG tablet  Daily     04/24/18 0924    HYDROcodone-acetaminophen (NORCO/VICODIN) 5-325 MG tablet  Every 6 hours PRN     04/24/18 0925           Dalia Heading, PA-C 04/27/18 1537    Blanchie Dessert, MD 04/28/18 0015

## 2018-05-08 ENCOUNTER — Other Ambulatory Visit: Payer: Self-pay

## 2018-05-08 ENCOUNTER — Ambulatory Visit (INDEPENDENT_AMBULATORY_CARE_PROVIDER_SITE_OTHER): Payer: Medicaid Other

## 2018-05-08 VITALS — BP 145/65 | HR 58 | Wt 247.9 lb

## 2018-05-08 DIAGNOSIS — N950 Postmenopausal bleeding: Secondary | ICD-10-CM

## 2018-05-08 MED ORDER — LEUPROLIDE ACETATE (3 MONTH) 11.25 MG IM KIT: 11.2500 mg | PACK | INTRAMUSCULAR | 2 refills | Status: DC

## 2018-05-08 MED ORDER — LEUPROLIDE ACETATE 3.75 MG IM KIT
3.7500 mg | PACK | Freq: Once | INTRAMUSCULAR | Status: AC
  Administered 2018-05-08: 3.75 mg via INTRAMUSCULAR

## 2018-05-08 NOTE — Progress Notes (Signed)
Presents for LupronDepot, given in [place], tolerated well.  Next Lupron 11/6-20/2019  Administrations This Visit    leuprolide (LUPRON) injection 3.75 mg    Admin Date 05/08/2018 Action Given Dose 3.75 mg Route Intramuscular Administered By Tamela Oddi, RMA

## 2018-05-28 ENCOUNTER — Telehealth: Payer: Self-pay

## 2018-05-28 NOTE — Telephone Encounter (Signed)
  Tc to confirm Depo Lupron will be shipped 06/13/18.  [phone]

## 2018-06-13 ENCOUNTER — Telehealth: Payer: Self-pay

## 2018-06-13 NOTE — Telephone Encounter (Signed)
Return call to [facility] per vm  Rx will not be sent until next depo lupron injection is due ins will not pay if Rx is sent early. [phone].

## 2018-07-02 ENCOUNTER — Ambulatory Visit: Payer: Medicaid Other | Admitting: Podiatry

## 2018-07-09 ENCOUNTER — Ambulatory Visit: Payer: Medicaid Other | Admitting: Podiatry

## 2018-07-23 ENCOUNTER — Ambulatory Visit: Payer: Medicaid Other | Admitting: Podiatry

## 2018-08-02 ENCOUNTER — Ambulatory Visit: Payer: Medicaid Other

## 2018-08-02 DIAGNOSIS — N950 Postmenopausal bleeding: Secondary | ICD-10-CM

## 2018-08-02 NOTE — Progress Notes (Signed)
Pt in office for depo Lupron, advised pt that Lupron is not in office today. Contacted the specialty pharmacy [phone] and spoke to [place]. She stated that auto refill request had been voided in their system in September, but would try and get the Lupron to our office by 08-06-18. Informed the pt and pt set appt for 08-07-18, advised would call with updates.

## 2018-08-02 NOTE — Progress Notes (Signed)
I have reviewed the chart and agree with nursing staff's documentation of this patient's encounter.  Mora Bellman, MD 08/02/2018 10:42 AM

## 2018-08-05 ENCOUNTER — Telehealth: Payer: Self-pay | Admitting: *Deleted

## 2018-08-05 NOTE — Telephone Encounter (Signed)
Pt made aware that I spoke with specialty pharmacy regarding her Lupron Rx. Rx should arrive in our office tomorrow for her appt on 11/20.

## 2018-08-07 ENCOUNTER — Ambulatory Visit (INDEPENDENT_AMBULATORY_CARE_PROVIDER_SITE_OTHER): Payer: Medicaid Other

## 2018-08-07 DIAGNOSIS — N950 Postmenopausal bleeding: Secondary | ICD-10-CM

## 2018-08-07 MED ORDER — LEUPROLIDE ACETATE (3 MONTH) 11.25 MG IM KIT
11.2500 mg | PACK | Freq: Once | INTRAMUSCULAR | Status: AC
  Administered 2018-08-07: 11.25 mg via INTRAMUSCULAR

## 2018-08-07 NOTE — Progress Notes (Signed)
Agree with A & P. 

## 2018-08-07 NOTE — Addendum Note (Signed)
Addended by: Lucianne Lei on: 08/07/2018 03:45 PM   Modules accepted: Orders

## 2018-08-07 NOTE — Progress Notes (Signed)
Pt presents for depo lupron inj. Pt given in [place]. Pt tolerated well. Next depo lupron due 2/5-2/19

## 2018-08-22 ENCOUNTER — Other Ambulatory Visit: Payer: Self-pay | Admitting: Specialist

## 2018-08-22 DIAGNOSIS — Z1231 Encounter for screening mammogram for malignant neoplasm of breast: Secondary | ICD-10-CM

## 2018-09-01 ENCOUNTER — Encounter (HOSPITAL_COMMUNITY): Payer: Self-pay | Admitting: Emergency Medicine

## 2018-09-01 ENCOUNTER — Emergency Department (HOSPITAL_COMMUNITY)
Admission: EM | Admit: 2018-09-01 | Discharge: 2018-09-01 | Disposition: A | Discharge status: Home / Self Care | Payer: Medicaid Other | Attending: Emergency Medicine | Admitting: Emergency Medicine

## 2018-09-01 DIAGNOSIS — I5032 Chronic diastolic (congestive) heart failure: Secondary | ICD-10-CM | POA: Insufficient documentation

## 2018-09-01 DIAGNOSIS — M109 Gout, unspecified: Secondary | ICD-10-CM | POA: Diagnosis not present

## 2018-09-01 DIAGNOSIS — Z87891 Personal history of nicotine dependence: Secondary | ICD-10-CM | POA: Insufficient documentation

## 2018-09-01 DIAGNOSIS — M79672 Pain in left foot: Secondary | ICD-10-CM | POA: Diagnosis present

## 2018-09-01 DIAGNOSIS — E1122 Type 2 diabetes mellitus with diabetic chronic kidney disease: Secondary | ICD-10-CM | POA: Diagnosis not present

## 2018-09-01 DIAGNOSIS — I130 Hypertensive heart and chronic kidney disease with heart failure and stage 1 through stage 4 chronic kidney disease, or unspecified chronic kidney disease: Secondary | ICD-10-CM | POA: Diagnosis not present

## 2018-09-01 DIAGNOSIS — J45909 Unspecified asthma, uncomplicated: Secondary | ICD-10-CM | POA: Diagnosis not present

## 2018-09-01 DIAGNOSIS — N183 Chronic kidney disease, stage 3 (moderate): Secondary | ICD-10-CM | POA: Diagnosis not present

## 2018-09-01 MED ORDER — PREDNISONE 20 MG PO TABS
40.0000 mg | ORAL_TABLET | Freq: Once | ORAL | Status: AC
  Administered 2018-09-01: 40 mg via ORAL
  Filled 2018-09-01: qty 2

## 2018-09-01 MED ORDER — PREDNISONE 20 MG PO TABS: 40.0000 mg | ORAL_TABLET | Freq: Every day | ORAL | 0 refills | Status: AC

## 2018-09-01 NOTE — ED Notes (Signed)
Pt stable, states understanding of discharge instructions 

## 2018-09-01 NOTE — ED Provider Notes (Signed)
Emergency Department Provider Note   I have reviewed the triage vital signs and the nursing notes.   HISTORY  Chief Complaint Foot Pain   HPI [name] is a 62 y.o. female with PMH of DM, gout, asthma, HTN, HLD, chronic right leg/foot pain presents to the emergency department for evaluation of gout flare in the left foot.  Pain is worse at the base of the left great toe with some associated swelling and redness.  Patient denies any fevers.  No injury to the foot.  She has had a history of gout and states this feels similar.  She has not been taking her Colchicine in the last several weeks.  Pain is moderate, constant, worse with movement.  She has baseline pain and decreased mobility with her right foot and states that with the new gout pain in her left she is unable to walk in a steady manner.  She does have a home health aide to assist her at times.   Past Medical History:  Diagnosis Date  . Achilles rupture   . Anxiety   . Arthritis   . Asthma   . Chronic back pain   . Chronic headaches   . Chronic leg pain    bilaterally  . Depression   . Diabetes mellitus 1999   T2DM  . Glaucoma   . H/O gestational diabetes mellitus, not currently pregnant   . Hyperlipidemia   . Hypertension   . Morbid obesity ([place])   . OSA (obstructive sleep apnea)   . Sciatica     Patient Active Problem List   Diagnosis Date Noted  . Nausea & vomiting 07/01/2017  . Acute gout due to renal impairment involving left ankle   . Hypertensive heart and renal disease   . SOB (shortness of breath)   . Chronic diastolic heart failure ([place]) 11/30/2016  . CKD (chronic kidney disease) stage 3, GFR 30-59 ml/min (HCC) 11/30/2016  . Thrombocytopenia ([place]) 11/30/2016  . Sinus bradycardia 11/30/2016  . PMB (postmenopausal bleeding) 05/09/2010  . ANEMIA 05/05/2010  . DEPRESSION 04/26/2010  . TMJ SYNDROME 04/26/2010  . DEGENERATIVE DISC DISEASE, CERVICAL SPINE 04/26/2010  . CHEST PAIN 04/26/2010  .  CHRONIC OBSTRUCTIVE PULMONARY DISEASE, MODERATE 03/26/2010  . MASTALGIA 10/08/2009  . Iron deficiency anemia 07/12/2009  . Diabetes mellitus ([place]) 07/02/2009  . Hyperlipidemia 07/02/2009  . BMI 45.0-49.9, adult ([place]) 07/02/2009  . OSA (obstructive sleep apnea) 07/02/2009  . Glaucoma 07/02/2009  . Hypertension 07/02/2009  . ASTHMA 07/02/2009  . LOW BACK PAIN SYNDROME 07/02/2009  . BACK PAIN 07/02/2009  . PERIPHERAL EDEMA 07/02/2009  . GROIN PAIN 07/02/2009  . TOBACCO USE, QUIT 07/02/2009  . HEMORRHOIDS, INTERNAL 07/16/2008  . DIVERTICULOSIS OF COLON 07/16/2008    Past Surgical History:  Procedure Laterality Date  . BACK SURGERY    . BREAST LUMPECTOMY     Right breast  . CERVICAL SPINE SURGERY    . CESAREAN SECTION  12/10/89  . CHOLECYSTECTOMY  1991  . LUMBAR SPINE SURGERY    . RIGHT HEART CATH N/A 12/06/2016   Procedure: Right Heart Cath;  Surgeon: Larey Dresser, MD;  Location: [facility];  Service: Cardiovascular;  Laterality: N/A;  . TUBAL LIGATION     Allergies Pork allergy; Pork-derived products; and Whey  Family History  Problem Relation Age of Onset  . Heart failure Mother   . Renal Disease Mother     Social History Social History   Tobacco Use  . Smoking  status: Former Smoker    Types: Cigarettes    Last attempt to quit: 09/04/1998    Years since quitting: 20.0  . Smokeless tobacco: Never Used  Substance Use Topics  . Alcohol use: Yes    Comment: occasional  . Drug use: No    Review of Systems  Constitutional: No fever/chills Eyes: No visual changes. ENT: No sore throat. Cardiovascular: Denies chest pain. Respiratory: Denies shortness of breath. Gastrointestinal: No abdominal pain.  No nausea, no vomiting.  No diarrhea.  No constipation. Genitourinary: Negative for dysuria. Musculoskeletal: Positive left foot pain.  Skin: Negative for rash. Neurological: Negative for headaches, focal weakness or numbness.  10-point ROS otherwise  negative.  ____________________________________________   PHYSICAL EXAM:  VITAL SIGNS: ED Triage Vitals  Enc Vitals Group     BP 09/01/18 1128 (!) 138/52     Pulse Rate 09/01/18 1128 70     Resp 09/01/18 1128 18     Temp 09/01/18 1128 98.2 F (36.8 C)     Temp Source 09/01/18 1128 Oral     SpO2 09/01/18 1128 100 %     Pain Score 09/01/18 1127 10   Constitutional: Alert and oriented. Well appearing and in no acute distress. Eyes: Conjunctivae are normal. Head: Atraumatic. Nose: No congestion/rhinnorhea. Mouth/Throat: Mucous membranes are moist.  Neck: No stridor.   Cardiovascular: Normal rate, regular rhythm.  Respiratory: Normal respiratory effort.  Gastrointestinal: No distention.  Musculoskeletal: Swelling and redness at the left MTP joint. No foot or ankle cellulitis.  Neurologic:  Normal speech and language. No gross focal neurologic deficits are appreciated.  Skin:  Skin is warm, dry and intact. No rash noted.  ____________________________________________  RADIOLOGY  None ____________________________________________   PROCEDURES  Procedure(s) performed:   Procedures  None ____________________________________________   INITIAL IMPRESSION / ASSESSMENT AND PLAN / ED COURSE  Pertinent labs & imaging results that were available during my care of the patient were reviewed by me and considered in my medical decision making (see chart for details).  Patient presents to the emergency department for evaluation of left foot pain consistent with gout.  Exam and history are not concerning for septic joint.  No injury.  Plan for prednisone which the patient has tolerated in the past.  She is an insulin-dependent diabetic but states when she is on steroids she is able to adjust her sliding scale insulin for coverage.  Discussed return precautions and taking over-the-counter pain medication.  Because of patient's chronic pain in the right leg and foot he is having  difficulty walking with her gout flare.  I did agree to write a prescription for DME home wheelchair. Patient also has the occasional assistance of a home health aid.    ____________________________________________  FINAL CLINICAL IMPRESSION(S) / ED DIAGNOSES  Final diagnoses:  Acute gout of left foot, unspecified cause     MEDICATIONS GIVEN DURING THIS VISIT:  Medications  predniSONE (DELTASONE) tablet 40 mg (has no administration in time range)     NEW OUTPATIENT MEDICATIONS STARTED DURING THIS VISIT:  New Prescriptions   PREDNISONE (DELTASONE) 20 MG TABLET    Take 2 tablets (40 mg total) by mouth daily for 4 days.    Note:  This document was prepared using Dragon voice recognition software and may include unintentional dictation errors.  Nanda Quinton, MD Emergency Medicine    Leanah Kolander, [facility], MD 09/01/18 [phone]

## 2018-09-01 NOTE — Discharge Instructions (Signed)
You were seen in the ED today with gout in the left foot. Take Motrin and/or Tylenol as needed for pain. Use the steroid as directed. I am writing a wheelchair prescription to help with your mobility at home. The steroid will make your blood sugars rise. Monitor your blood sugar and make changes to your sliding scale insulin as needed.

## 2018-09-01 NOTE — ED Triage Notes (Addendum)
Left foot pain states ran out of meds for gout  But she does have a refill but she could not get to pharmacy to get it, has assistant that comes to help her get her dressed, h as back issues and heart issues that helped her get an  assistent

## 2018-10-04 ENCOUNTER — Ambulatory Visit
Admission: RE | Admit: 2018-10-04 | Discharge: 2018-10-04 | Disposition: A | Discharge status: Home / Self Care | Payer: Medicaid Other | Source: Ambulatory Visit | Attending: Specialist | Admitting: Specialist

## 2018-10-04 DIAGNOSIS — Z1231 Encounter for screening mammogram for malignant neoplasm of breast: Secondary | ICD-10-CM

## 2018-12-03 ENCOUNTER — Telehealth: Payer: Self-pay

## 2018-12-03 NOTE — Telephone Encounter (Signed)
Spoke with the patient and advised of need for appt for repeat endometrial biopsy and annual, and to stop Lupron injections until results are back, pt agreed. Advised that scheduler will call with appt.

## 2018-12-04 ENCOUNTER — Other Ambulatory Visit: Payer: Self-pay

## 2018-12-04 ENCOUNTER — Other Ambulatory Visit (HOSPITAL_COMMUNITY): Payer: Self-pay | Admitting: *Deleted

## 2018-12-05 ENCOUNTER — Encounter (HOSPITAL_COMMUNITY)
Admission: RE | Admit: 2018-12-05 | Discharge: 2018-12-05 | Disposition: A | Discharge status: Home / Self Care | Payer: Medicaid Other | Source: Ambulatory Visit | Attending: Nephrology | Admitting: Nephrology

## 2018-12-05 DIAGNOSIS — D631 Anemia in chronic kidney disease: Secondary | ICD-10-CM | POA: Insufficient documentation

## 2018-12-05 MED ORDER — SODIUM CHLORIDE 0.9 % IV SOLN
510.0000 mg | INTRAVENOUS | Status: DC
  Administered 2018-12-05: 510 mg via INTRAVENOUS
  Filled 2018-12-05: qty 510

## 2018-12-05 NOTE — Discharge Instructions (Signed)

## 2018-12-05 NOTE — Progress Notes (Signed)
Patient at this time no longer needs PIV start, RN was able to obtain PIV

## 2018-12-12 ENCOUNTER — Encounter (HOSPITAL_COMMUNITY)
Admission: RE | Admit: 2018-12-12 | Discharge: 2018-12-12 | Disposition: A | Discharge status: Home / Self Care | Payer: Medicaid Other | Source: Ambulatory Visit | Attending: Nephrology | Admitting: Nephrology

## 2018-12-12 ENCOUNTER — Other Ambulatory Visit: Payer: Self-pay

## 2018-12-12 DIAGNOSIS — D631 Anemia in chronic kidney disease: Secondary | ICD-10-CM | POA: Diagnosis not present

## 2018-12-12 MED ORDER — SODIUM CHLORIDE 0.9 % IV SOLN
510.0000 mg | INTRAVENOUS | Status: DC
  Administered 2018-12-12: 510 mg via INTRAVENOUS
  Filled 2018-12-12: qty 510

## 2019-01-01 ENCOUNTER — Ambulatory Visit: Payer: Medicaid Other | Admitting: Obstetrics and Gynecology

## 2019-03-25 ENCOUNTER — Ambulatory Visit: Payer: Medicaid Other | Attending: Physician Assistant | Admitting: Physical Therapy

## 2019-03-25 ENCOUNTER — Other Ambulatory Visit: Payer: Self-pay

## 2019-03-25 DIAGNOSIS — M5441 Lumbago with sciatica, right side: Secondary | ICD-10-CM | POA: Diagnosis present

## 2019-03-25 DIAGNOSIS — M6281 Muscle weakness (generalized): Secondary | ICD-10-CM | POA: Diagnosis present

## 2019-03-25 DIAGNOSIS — G8929 Other chronic pain: Secondary | ICD-10-CM

## 2019-03-25 DIAGNOSIS — R262 Difficulty in walking, not elsewhere classified: Secondary | ICD-10-CM | POA: Diagnosis present

## 2019-03-25 NOTE — Therapy (Signed)
[place], [place], [address] Phone: [phone]   Fax:  [phone]  Physical Therapy Evaluation  Patient Details  Name: [name] MRN: [id number] Date of Birth: 10-11-1955 Referring Provider (PT): Lillia Corporal MD (PCP)   Encounter Date: 03/25/2019  PT End of Session - 03/25/19 0829    Visit Number  1    Number of Visits  12    Date for PT Re-Evaluation  05/26/19    Authorization Type  Medicaid    Authorization Time Period  submitted for 3 visits on 03/25/2019    Authorization - Visit Number  1    Authorization - Number of Visits  4    PT Start Time  0818    PT Stop Time  0905    PT Time Calculation (min)  47 min    Activity Tolerance  Patient tolerated treatment well       Past Medical History:  Diagnosis Date  . Achilles rupture   . Anxiety   . Arthritis   . Asthma   . Chronic back pain   . Chronic headaches   . Chronic leg pain    bilaterally  . Depression   . Diabetes mellitus 1999   T2DM  . Glaucoma   . H/O gestational diabetes mellitus, not currently pregnant   . Hyperlipidemia   . Hypertension   . Morbid obesity ([place])   . OSA (obstructive sleep apnea)   . Sciatica     Past Surgical History:  Procedure Laterality Date  . BACK SURGERY    . BREAST EXCISIONAL BIOPSY Right   . CERVICAL SPINE SURGERY    . CESAREAN SECTION  12/10/89  . CHOLECYSTECTOMY  1991  . LUMBAR SPINE SURGERY    . RIGHT HEART CATH N/A 12/06/2016   Procedure: Right Heart Cath;  Surgeon: Larey Dresser, MD;  Location: [facility];  Service: Cardiovascular;  Laterality: N/A;  . TUBAL LIGATION      There were no vitals filed for this visit.   Subjective Assessment - 03/25/19 0819    Subjective  Pt arriving to therapy reporting long standing Low Back Pain since surgery in 2015. Pt reporting flare up after an incident at [facility] around a month ago where she was stuck in an emergency entrance in a  power wheelchair. Pt went to urgent care following the incident. Pt reported she was already having back pain prior to the incident but her pain worsened.    Pertinent History  lumbar surgery 2015, diabetes,    Limitations  Sitting;Standing;Walking    How long can you sit comfortably?  unlimited    How long can you stand comfortably?  5 minutes    How long can you walk comfortably?  10 minutes with rollator walker    Diagnostic tests  X-rays at Urgent Care about month ago    Patient Stated Goals  stop hurting, walk better    Currently in Pain?  Yes    Pain Score  8     Pain Location  Back    Pain Orientation  Lower    Pain Descriptors / Indicators  Aching    Pain Type  Chronic pain    Pain Radiating Towards  numbness in bilateral feet    Pain Onset  More than a month ago    Pain Frequency  Intermittent    Aggravating Factors   walking, standing    Pain Relieving Factors  sitting  Effect of Pain on Daily Activities  difficutly completing house hold chores         [facility] PT Assessment - 03/25/19 0001      Assessment   Medical Diagnosis  low back pain    Referring Provider (PT)  Lillia Corporal MD   PCP   Onset Date/Surgical Date  --   worsening about 1 month ago   Hand Dominance  Right    Next MD Visit  follow up after therapy    Prior Therapy  yes, years ago after back surgery      Precautions   Precautions  None      Restrictions   Weight Bearing Restrictions  No      Balance Screen   Has the patient fallen in the past 6 months  No    Has the patient had a decrease in activity level because of a fear of falling?   --   Pt reporting LOB but no falls   Is the patient reluctant to leave their home because of a fear of falling?   No      Home Environment   Living Environment  Private residence    Living Arrangements  Alone    Type of [place] to enter    Entrance Stairs-Number of Steps  3 steps   pt trying to move to handicap accessible  place   [place]  One level    [place] - 4 wheels;[place] - single point;Wheelchair - manual      Prior Function   Level of Independence  Independent with basic ADLs      Cognition   Overall Cognitive Status  Within Functional Limits for tasks assessed      Posture/Postural Control   Posture/Postural Control  Postural limitations    Postural Limitations  Rounded Shoulders;Forward head;Increased lumbar lordosis;Increased thoracic kyphosis      ROM / Strength   AROM / PROM / Strength  AROM;Strength      AROM   Overall AROM   Deficits    AROM Assessment Site  Lumbar    Lumbar Flexion  40    Lumbar Extension  10    Lumbar - Right Side Bend  --   LOB when attempting, pt required assistance   Lumbar - Left Side Bend  --   LOB when attempted, LOB requiring assistance   Lumbar - Right Rotation  limited 50% tested in sitting    Lumbar - Left Rotation  limited 50%, tested in sitting      Strength   Overall Strength  Deficits    Strength Assessment Site  Hip;Knee    Right/Left Hip  Right;Left    Right Hip Flexion  4/5    Right Hip Extension  4/5    Right Hip ABduction  4/5    Right Hip ADduction  4/5    Left Hip Flexion  4/5    Left Hip ABduction  4/5    Left Hip ADduction  4/5    Right/Left Knee  Right;Left    Right Knee Flexion  4-/5    Right Knee Extension  4-/5    Left Knee Flexion  4/5    Left Knee Extension  4/5      Palpation   Palpation comment  TTP over lumbar spine and paraspinals      Special Tests    Special  Tests  --      Transfers   Five time sit to stand comments   21.8      Ambulation/Gait   Ambulation/Gait  Yes    Ambulation/Gait Assistance  6: Modified independent (Device/Increase time)    Ambulation Distance (Feet)  40 Feet    Assistive device  4-wheeled walker    Gait Pattern  Step-through pattern;Decreased step length - right;Decreased step length - left;Decreased stride length;Poor foot clearance -  left;Poor foot clearance - right    Ambulation Surface  Level;Indoor      Balance   Balance Assessed  Yes   unable to perform SLS bilaterally               Objective measurements completed on examination: See above findings.      [facility] Adult PT Treatment/Exercise - 03/25/19 0001      Balance   Balance Assessed  Yes      Standardized Balance Assessment   Standardized Balance Assessment  Berg Balance Test      Berg Balance Test   Sit to Stand  Able to stand  independently using hands    Standing Unsupported  Able to stand 30 seconds unsupported    Sitting with Back Unsupported but Feet Supported on Floor or Stool  Able to sit safely and securely 2 minutes    Stand to Sit  Uses backs of legs against chair to control descent    Transfers  Able to transfer safely, definite need of hands    Standing Unsupported with Eyes Closed  Unable to keep eyes closed 3 seconds but stays steady    Standing Ubsupported with Feet Together  Needs help to attain position but able to stand for 30 seconds with feet together    From Standing, Reach Forward with Outstretched Arm  Loses balance while trying/requires external support    From Standing Position, Pick up Object from Floor  Unable to pick up and needs supervision    From Standing Position, Turn to Look Behind Over each Shoulder  Needs assist to keep from losing balance and falling    Turn 360 Degrees  Needs assistance while turning    Standing Unsupported, Alternately Place Feet on Step/Stool  Needs assistance to keep from falling or unable to try    Standing Unsupported, One Foot in Front  Loses balance while stepping or standing    Standing on One Leg  Unable to try or needs assist to prevent fall    Total Score  17             PT Education - 03/25/19 0828    Education Details  posture, HEP    Person(s) Educated  Patient    Methods  Explanation;Demonstration    Comprehension  Verbalized understanding       PT Short  Term Goals - 03/25/19 0917      PT SHORT TERM GOAL #1   Title  Pt will be independent in her HEP.    Baseline  issued HEP inital on 77/2020    Time  3    Period  Weeks    Status  New    Target Date  04/15/19      PT SHORT TERM GOAL #2   Title  Pt will be able to perform sit to stand with no UE support safely and maintain balance for 2 minutes.    Baseline  pt requires UE assistance and presents with ant/posterior sway when standing  Time  3    Period  Weeks    Status  New    Target Date  04/15/19      PT SHORT TERM GOAL #3   Title  Pt will amb with rollator walker > 500 feet on community surfaces without taking rest breaks due to fatigue.    Baseline  Pt having to stop after about 100 feet due to fatigue in LE's.    Time  3    Period  Weeks    Status  New    Target Date  04/15/19        PT Long Term Goals - 03/25/19 0921      PT LONG TERM GOAL #1   Title  Pt will be able to improve her bilateral LE strength to grossly 5/5 in knee flexion and extension and hip flexion.    Baseline  currently -4/5 in R LE and 4/5 in L LE.    Time  6    Period  Weeks    Status  New    Target Date  05/06/19      PT LONG TERM GOAL #2   Title  Pt will be able to perform sit to stand test in </= 14 seconds safely.    Baseline  22 seconds using bilateral UE for support and not able to compete full standing posture with full hip extension.    Time  6    Period  Weeks    Status  New    Target Date  05/06/19      PT LONG TERM GOAL #3   Title  Pt will report pain </= 3/10 when walking using a LRAD for >/= 1000 feet.    Baseline  Pt reporting pain of 7-8/10 when walking short distances less than 100 feet.    Time  6    Period  Weeks    Status  New    Target Date  05/06/19      PT LONG TERM GOAL #4   Title  Pt will improve her BERG balance score by 8 points to 25/56 in order to improve functional moiblity.    Baseline  17/56    Time  6    Period  Weeks    Status  New    Target Date   05/06/19             Plan - 03/25/19 0912    Personal Factors and Comorbidities  Comorbidity 3+;Fitness;Transportation    Comorbidities  back surgery 2015, chronic back pain, asthma, diabetes, arthritis, HTN, sciatica, OSA, glaucoma, R heart cath, achilles tendon injury on R, Pt receives Home Aide assistance 18 hours each week.    Examination-Activity Limitations  Stairs;Transfers;Stand    Examination-Participation Restrictions  Shop;Community Activity    Stability/Clinical Decision Making  Evolving/Moderate complexity    Clinical Decision Making  Moderate    Rehab Potential  Fair    PT Frequency  2x / week    PT Duration  6 weeks    PT Treatment/Interventions  ADLs/Self Care Home Management;Electrical Stimulation;Moist Heat;Ultrasound;Gait training;Stair training;Functional mobility training;Therapeutic activities;Therapeutic exercise;Balance training;Neuromuscular re-education;Patient/family education;Manual techniques;Passive range of motion    PT Next Visit Plan  Core strengthening, LE strengthening, Lumbar stretching, posture edu, Manual therapy as needed, Balance exercises    PT Home Exercise Plan  SLR, seated marching, SKTC    Consulted and Agree with Plan of Care  Patient       Patient will benefit from skilled  therapeutic intervention in order to improve the following deficits and impairments:  Abnormal gait, Difficulty walking, Pain, Postural dysfunction, Impaired flexibility, Decreased strength, Decreased activity tolerance, Obesity, Decreased range of motion  Visit Diagnosis: 1. Chronic bilateral low back pain with right-sided sciatica   2. Muscle weakness (generalized)   3. Difficulty in walking, not elsewhere classified        Problem List Patient Active Problem List   Diagnosis Date Noted  . Nausea & vomiting 07/01/2017  . Acute gout due to renal impairment involving left ankle   . Hypertensive heart and renal disease   . SOB (shortness of breath)   .  Chronic diastolic heart failure ([place]) 11/30/2016  . CKD (chronic kidney disease) stage 3, GFR 30-59 ml/min (HCC) 11/30/2016  . Thrombocytopenia ([place]) 11/30/2016  . Sinus bradycardia 11/30/2016  . PMB (postmenopausal bleeding) 05/09/2010  . ANEMIA 05/05/2010  . DEPRESSION 04/26/2010  . TMJ SYNDROME 04/26/2010  . DEGENERATIVE DISC DISEASE, CERVICAL SPINE 04/26/2010  . CHEST PAIN 04/26/2010  . CHRONIC OBSTRUCTIVE PULMONARY DISEASE, MODERATE 03/26/2010  . MASTALGIA 10/08/2009  . Iron deficiency anemia 07/12/2009  . Diabetes mellitus ([place]) 07/02/2009  . Hyperlipidemia 07/02/2009  . BMI 45.0-49.9, adult ([place]) 07/02/2009  . OSA (obstructive sleep apnea) 07/02/2009  . Glaucoma 07/02/2009  . Hypertension 07/02/2009  . ASTHMA 07/02/2009  . LOW BACK PAIN SYNDROME 07/02/2009  . BACK PAIN 07/02/2009  . PERIPHERAL EDEMA 07/02/2009  . GROIN PAIN 07/02/2009  . TOBACCO USE, QUIT 07/02/2009  . HEMORRHOIDS, INTERNAL 07/16/2008  . DIVERTICULOSIS OF COLON 07/16/2008    Oretha Caprice, PT 03/25/2019, 9:29 AM  [facility] [address] [place], [place], [address] Phone: [phone]   Fax:  [phone]  Name: [name] MRN: [id number] Date of Birth: 1956-03-31

## 2019-03-25 NOTE — Patient Instructions (Signed)
(  SLR) Straight  Leg Raise:  x 10, 2 sets, twice daily  [facility]): Single Knee to Chest:  x 5 holding, 20 seconds, twice daily  Seated marching x 30, 3-4 times a  day

## 2019-03-27 ENCOUNTER — Ambulatory Visit: Payer: Medicaid Other | Admitting: Physical Therapy

## 2019-04-08 ENCOUNTER — Ambulatory Visit: Payer: Medicaid Other

## 2019-04-08 ENCOUNTER — Other Ambulatory Visit: Payer: Self-pay

## 2019-04-08 DIAGNOSIS — G8929 Other chronic pain: Secondary | ICD-10-CM

## 2019-04-08 DIAGNOSIS — M6281 Muscle weakness (generalized): Secondary | ICD-10-CM

## 2019-04-08 DIAGNOSIS — M5441 Lumbago with sciatica, right side: Secondary | ICD-10-CM | POA: Diagnosis not present

## 2019-04-08 DIAGNOSIS — R262 Difficulty in walking, not elsewhere classified: Secondary | ICD-10-CM

## 2019-04-08 NOTE — Patient Instructions (Signed)
PELVIC TILT: Posterior    Tighten abdominals, flatten low back. _10__ reps per set, __2_ sets per day, _7Lower Trunk Rotation Stretch    Keeping back flat and feet together, rotate knees to left side. Hold __20-30__ seconds. Repeat _3___ times per set. Do __1__ sets per session. Do __2__ sessions per day.  http://orth.exer.us/123   Copyright  VHI. All rights reserved.  __ days per week   Copyright  VHI. All rights reserved.

## 2019-04-08 NOTE — Therapy (Signed)
[place], [place], [address] Phone: [phone]   Fax:  [phone]  Physical Therapy Treatment  Patient Details  Name: [name] MRN: [id number] Date of Birth: 08-17-1956 Referring Provider (PT): Lillia Corporal MD (PCP)   Encounter Date: 04/08/2019  PT End of Session - 04/08/19 0715    Visit Number  2    Number of Visits  12    Date for PT Re-Evaluation  05/26/19    Authorization Type  Medicaid    Authorization Time Period  submitted for 3 visits on 03/25/2019    Authorization - Visit Number  2    Authorization - Number of Visits  4    PT Start Time  0715    PT Stop Time  0810    PT Time Calculation (min)  55 min    Activity Tolerance  Patient tolerated treatment well    Behavior During Therapy  [facility] for tasks assessed/performed       Past Medical History:  Diagnosis Date  . Achilles rupture   . Anxiety   . Arthritis   . Asthma   . Chronic back pain   . Chronic headaches   . Chronic leg pain    bilaterally  . Depression   . Diabetes mellitus 1999   T2DM  . Glaucoma   . H/O gestational diabetes mellitus, not currently pregnant   . Hyperlipidemia   . Hypertension   . Morbid obesity ([place])   . OSA (obstructive sleep apnea)   . Sciatica     Past Surgical History:  Procedure Laterality Date  . BACK SURGERY    . BREAST EXCISIONAL BIOPSY Right   . CERVICAL SPINE SURGERY    . CESAREAN SECTION  12/10/89  . CHOLECYSTECTOMY  1991  . LUMBAR SPINE SURGERY    . RIGHT HEART CATH N/A 12/06/2016   Procedure: Right Heart Cath;  Surgeon: Larey Dresser, MD;  Location: [place] [facility];  Service: Cardiovascular;  Laterality: N/A;  . TUBAL LIGATION      There were no vitals filed for this visit.  Subjective Assessment - 04/08/19 0721    Subjective  She has done the HEP every morning. and when siting.    She reports nothing different in past 2 weeks.    Pain Score  7     Pain Location  Back     Pain Orientation  Lower;Right;Left   RT more than Lt.   Pain Descriptors / Indicators  Aching    Pain Type  Chronic pain    Pain Radiating Towards  numb in feet    Pain Onset  More than a month ago    Pain Frequency  Constant    Aggravating Factors   stand and walking    Pain Relieving Factors  sitting                       [facility] Adult PT Treatment/Exercise - 04/08/19 0001      Exercises   Exercises  Lumbar      Lumbar Exercises: Stretches   Single Knee to Chest Stretch  Right;Left;2 reps;30 seconds    Lower Trunk Rotation  2 reps;20 seconds    Lower Trunk Rotation Limitations  RT/LT     Piriformis Stretch  Right;Left;1 rep;30 seconds      Lumbar Exercises: Seated   Other Seated Lumbar Exercises  Marching x 10 RT/Lt       Lumbar  Exercises: Supine   Clam  10 reps    Clam Limitations  RT/Lt single leg.     Straight Leg Raise  10 reps    Straight Leg Raises Limitations  RT/Lt      Lumbar Exercises: Sidelying   Clam  Right;Left;10 reps      Modalities   Modalities  Electrical Stimulation;Moist Heat      Moist Heat Therapy   Number Minutes Moist Heat  15 Minutes    Moist Heat Location  Lumbar Spine      Electrical Stimulation   Electrical Stimulation Location  lower back    Electrical Stimulation Action  IFC    Electrical Stimulation Parameters  to tolerance    Electrical Stimulation Goals  Pain      Manual Therapy   Manual Therapy  Joint mobilization;Soft tissue mobilization;Passive ROM;Manual Traction    Joint Mobilization  Gr 2-3 Pa T?S to sacrum    Soft tissue mobilization  paraspingal QL and gluteals,     Passive ROM  hip rotation     Manual Traction  RT/LT pull  Gr 2-3 x 30 reps             PT Education - 04/08/19 0758    Education Details  HEP    Person(s) Educated  Patient    Methods  Explanation;Tactile cues;Verbal cues;Handout    Comprehension  Returned demonstration;Verbalized understanding       PT Short Term Goals -  03/25/19 0917      PT SHORT TERM GOAL #1   Title  Pt will be independent in her HEP.    Baseline  issued HEP inital on 77/2020    Time  3    Period  Weeks    Status  New    Target Date  04/15/19      PT SHORT TERM GOAL #2   Title  Pt will be able to perform sit to stand with no UE support safely and maintain balance for 2 minutes.    Baseline  pt requires UE assistance and presents with ant/posterior sway when standing    Time  3    Period  Weeks    Status  New    Target Date  04/15/19      PT SHORT TERM GOAL #3   Title  Pt will amb with rollator walker > 500 feet on community surfaces without taking rest breaks due to fatigue.    Baseline  Pt having to stop after about 100 feet due to fatigue in LE's.    Time  3    Period  Weeks    Status  New    Target Date  04/15/19        PT Long Term Goals - 03/25/19 0921      PT LONG TERM GOAL #1   Title  Pt will be able to improve her bilateral LE strength to grossly 5/5 in knee flexion and extension and hip flexion.    Baseline  currently -4/5 in R LE and 4/5 in L LE.    Time  6    Period  Weeks    Status  New    Target Date  05/06/19      PT LONG TERM GOAL #2   Title  Pt will be able to perform sit to stand test in </= 14 seconds safely.    Baseline  22 seconds using bilateral UE for support and not able to compete full standing posture  with full hip extension.    Time  6    Period  Weeks    Status  New    Target Date  05/06/19      PT LONG TERM GOAL #3   Title  Pt will report pain </= 3/10 when walking using a LRAD for >/= 1000 feet.    Baseline  Pt reporting pain of 7-8/10 when walking short distances less than 100 feet.    Time  6    Period  Weeks    Status  New    Target Date  05/06/19      PT LONG TERM GOAL #4   Title  Pt will improve her BERG balance score by 8 points to 25/56 in order to improve functional moiblity.    Baseline  17/56    Time  6    Period  Weeks    Status  New    Target Date  05/06/19             Plan - 04/08/19 0717    Clinical Impression Statement  She needs stretching and core strength . Will give trial of heel left on LT next session. She said she felt better after session    Comorbidities  back surgery 2015, chronic back pain, asthma, diabetes, arthritis, HTN, sciatica, OSA, glaucoma, R heart cath, achilles tendon injury on R, Pt receives Home Aide assistance 18 hours each week.    PT Treatment/Interventions  ADLs/Self Care Home Management;Electrical Stimulation;Moist Heat;Ultrasound;Gait training;Stair training;Functional mobility training;Therapeutic activities;Therapeutic exercise;Balance training;Neuromuscular re-education;Patient/family education;Manual techniques;Passive range of motion    PT Next Visit Plan  Core strengthening, LE strengthening, Lumbar stretching, posture edu, Manual therapy as needed, Balance exercises.  Heel lift trial LT shoe    PT Home Exercise Plan  SLR, seated marching, SKTC, PPT , LTR    Consulted and Agree with Plan of Care  Patient       Patient will benefit from skilled therapeutic intervention in order to improve the following deficits and impairments:  Abnormal gait, Difficulty walking, Pain, Postural dysfunction, Impaired flexibility, Decreased strength, Decreased activity tolerance, Obesity, Decreased range of motion  Visit Diagnosis: 1. Muscle weakness (generalized)   2. Difficulty in walking, not elsewhere classified   3. Chronic bilateral low back pain with right-sided sciatica        Problem List Patient Active Problem List   Diagnosis Date Noted  . Nausea & vomiting 07/01/2017  . Acute gout due to renal impairment involving left ankle   . Hypertensive heart and renal disease   . SOB (shortness of breath)   . Chronic diastolic heart failure ([place]) 11/30/2016  . CKD (chronic kidney disease) stage 3, GFR 30-59 ml/min (H[facility]) 11/30/2016  . Thrombocytopenia ([place]) 11/30/2016  . Sinus bradycardia 11/30/2016  . PMB  (postmenopausal bleeding) 05/09/2010  . ANEMIA 05/05/2010  . DEPRESSION 04/26/2010  . TMJ SYNDROME 04/26/2010  . DEGENERATIVE DISC DISEASE, CERVICAL SPINE 04/26/2010  . CHEST PAIN 04/26/2010  . CHRONIC OBSTRUCTIVE PULMONARY DISEASE, MODERATE 03/26/2010  . MASTALGIA 10/08/2009  . Iron deficiency anemia 07/12/2009  . Diabetes mellitus ([place]) 07/02/2009  . Hyperlipidemia 07/02/2009  . BMI 45.0-49.9, adult ([place]) 07/02/2009  . OSA (obstructive sleep apnea) 07/02/2009  . Glaucoma 07/02/2009  . Hypertension 07/02/2009  . ASTHMA 07/02/2009  . LOW BACK PAIN SYNDROME 07/02/2009  . BACK PAIN 07/02/2009  . PERIPHERAL EDEMA 07/02/2009  . GROIN PAIN 07/02/2009  . TOBACCO USE, QUIT 07/02/2009  . HEMORRHOIDS, INTERNAL 07/16/2008  . DIVERTICULOSIS  OF COLON 07/16/2008    Darrel Hoover  PT 04/08/2019, 8:16 AM  [facility] [address] [place], [place], [address] Phone: [phone]   Fax:  [phone]  Name: Valora Norell MRN: [id number] Date of Birth: 11-14-1955

## 2019-04-10 ENCOUNTER — Other Ambulatory Visit: Payer: Self-pay

## 2019-04-10 ENCOUNTER — Ambulatory Visit: Payer: Medicaid Other

## 2019-04-10 DIAGNOSIS — R262 Difficulty in walking, not elsewhere classified: Secondary | ICD-10-CM

## 2019-04-10 DIAGNOSIS — G8929 Other chronic pain: Secondary | ICD-10-CM

## 2019-04-10 DIAGNOSIS — M6281 Muscle weakness (generalized): Secondary | ICD-10-CM

## 2019-04-10 DIAGNOSIS — M5441 Lumbago with sciatica, right side: Secondary | ICD-10-CM | POA: Diagnosis not present

## 2019-04-10 NOTE — Therapy (Addendum)
[place], [place], [address] Phone: [phone]   Fax:  [phone]  Physical Therapy Treatment/Discharge  Patient Details  Name: [name] MRN: [id number] Date of Birth: 08/12/1956 Referring Provider (PT): Lillia Corporal MD (PCP)   Encounter Date: 04/10/2019  PT End of Session - 04/10/19 0701    Visit Number  3    Number of Visits  12    Date for PT Re-Evaluation  05/26/19    Authorization Type  Medicaid    Authorization Time Period  submitted for 3 visits on 03/25/2019    Authorization - Visit Number  3    Authorization - Number of Visits  4       Past Medical History:  Diagnosis Date  . Achilles rupture   . Anxiety   . Arthritis   . Asthma   . Chronic back pain   . Chronic headaches   . Chronic leg pain    bilaterally  . Depression   . Diabetes mellitus 1999   T2DM  . Glaucoma   . H/O gestational diabetes mellitus, not currently pregnant   . Hyperlipidemia   . Hypertension   . Morbid obesity ([place])   . OSA (obstructive sleep apnea)   . Sciatica     Past Surgical History:  Procedure Laterality Date  . BACK SURGERY    . BREAST EXCISIONAL BIOPSY Right   . CERVICAL SPINE SURGERY    . CESAREAN SECTION  12/10/89  . CHOLECYSTECTOMY  1991  . LUMBAR SPINE SURGERY    . RIGHT HEART CATH N/A 12/06/2016   Procedure: Right Heart Cath;  Surgeon: Larey Dresser, MD;  Location: [place] [facility];  Service: Cardiovascular;  Laterality: N/A;  . TUBAL LIGATION      There were no vitals filed for this visit.  Subjective Assessment - 04/10/19 0701    Subjective  Doing the same                       [facility] Adult PT Treatment/Exercise - 04/10/19 0001      Lumbar Exercises: Stretches   Lower Trunk Rotation  3 reps;20 seconds    Lower Trunk Rotation Limitations  RT/LT     Pelvic Tilt  15 reps      Lumbar Exercises: Aerobic   Nustep  Le 8 min L$       Lumbar Exercises:  Supine   Bridge  10 reps    Straight Leg Raise  20 reps    Straight Leg Raises Limitations  RT/Lt      Moist Heat Therapy   Number Minutes Moist Heat  15 Minutes    Moist Heat Location  Lumbar Spine      Electrical Stimulation   Electrical Stimulation Location  lower back    Electrical Stimulation Action  IFC    Electrical Stimulation Parameters  to tolerance    Electrical Stimulation Goals  Pain             PT Education - 04/10/19 0719    Education Details  sitting knee to chest  with arm support, need for hip and core strength and decr lumbar lordosis as able as extension appears to incr pain most    Person(s) Educated  Patient    Methods  Explanation;Demonstration;Handout    Comprehension  Verbalized understanding;Returned demonstration       PT Short Term Goals - 04/10/19 0710      PT  SHORT TERM GOAL #1   Title  Pt will be independent in her HEP.    Status  Achieved      PT SHORT TERM GOAL #2   Title  Pt will be able to perform sit to stand with no UE support safely and maintain balance for 2 minutes.    Baseline  no UE support 18 sec so balance and functional strengthstrength somewhat decreased    Status  Achieved      PT SHORT TERM GOAL #3   Title  Pt will amb with rollator walker > 500 feet on community surfaces without taking rest breaks due to fatigue.    Baseline  She was able to do this today but I guess this is variable due to pain and how she feels on any given day    Status  Achieved        PT Long Term Goals - 03/25/19 0921      PT LONG TERM GOAL #1   Title  Pt will be able to improve her bilateral LE strength to grossly 5/5 in knee flexion and extension and hip flexion.    Baseline  currently -4/5 in R LE and 4/5 in L LE.    Time  6    Period  Weeks    Status  New    Target Date  05/06/19      PT LONG TERM GOAL #2   Title  Pt will be able to perform sit to stand test in </= 14 seconds safely.    Baseline  22 seconds using bilateral UE for  support and not able to compete full standing posture with full hip extension.    Time  6    Period  Weeks    Status  New    Target Date  05/06/19      PT LONG TERM GOAL #3   Title  Pt will report pain </= 3/10 when walking using a LRAD for >/= 1000 feet.    Baseline  Pt reporting pain of 7-8/10 when walking short distances less than 100 feet.    Time  6    Period  Weeks    Status  New    Target Date  05/06/19      PT LONG TERM GOAL #4   Title  Pt will improve her BERG balance score by 8 points to 25/56 in order to improve functional moiblity.    Baseline  17/56    Time  6    Period  Weeks    Status  New    Target Date  05/06/19            Plan - 04/10/19 0706    Clinical Impression Statement  She has essentiallt met all STG and  does a good job with her exercises. Will continue to push hip and core strength and endurance  with walking.    Comorbidities  back surgery 2015, chronic back pain, asthma, diabetes, arthritis, HTN, sciatica, OSA, glaucoma, R heart cath, achilles tendon injury on R, Pt receives Home Aide assistance 18 hours each week.    PT Frequency  2x / week    PT Duration  4 weeks    PT Treatment/Interventions  ADLs/Self Care Home Management;Electrical Stimulation;Moist Heat;Ultrasound;Gait training;Stair training;Functional mobility training;Therapeutic activities;Therapeutic exercise;Balance training;Neuromuscular re-education;Patient/family education;Manual techniques;Passive range of motion    PT Next Visit Plan  Core strengthening, LE strengthening, Lumbar stretching, posture edu, Manual therapy as needed, Balance exercises.  Heel  lift trial LT shoe    PT Home Exercise Plan  SLR, seated marching, SKTC, PPT , LTR, bridge,    Consulted and Agree with Plan of Care  Patient       Patient will benefit from skilled therapeutic intervention in order to improve the following deficits and impairments:  Abnormal gait, Difficulty walking, Pain, Postural dysfunction,  Impaired flexibility, Decreased strength, Decreased activity tolerance, Obesity, Decreased range of motion  Visit Diagnosis: 1. Chronic bilateral low back pain with right-sided sciatica   2. Muscle weakness (generalized)   3. Difficulty in walking, not elsewhere classified        Problem List Patient Active Problem List   Diagnosis Date Noted  . Nausea & vomiting 07/01/2017  . Acute gout due to renal impairment involving left ankle   . Hypertensive heart and renal disease   . SOB (shortness of breath)   . Chronic diastolic heart failure ([place]) 11/30/2016  . CKD (chronic kidney disease) stage 3, GFR 30-59 ml/min ([facility]) 11/30/2016  . Thrombocytopenia ([place]) 11/30/2016  . Sinus bradycardia 11/30/2016  . PMB (postmenopausal bleeding) 05/09/2010  . ANEMIA 05/05/2010  . DEPRESSION 04/26/2010  . TMJ SYNDROME 04/26/2010  . DEGENERATIVE DISC DISEASE, CERVICAL SPINE 04/26/2010  . CHEST PAIN 04/26/2010  . CHRONIC OBSTRUCTIVE PULMONARY DISEASE, MODERATE 03/26/2010  . MASTALGIA 10/08/2009  . Iron deficiency anemia 07/12/2009  . Diabetes mellitus ([place]) 07/02/2009  . Hyperlipidemia 07/02/2009  . BMI 45.0-49.9, adult ([place]) 07/02/2009  . OSA (obstructive sleep apnea) 07/02/2009  . Glaucoma 07/02/2009  . Hypertension 07/02/2009  . ASTHMA 07/02/2009  . LOW BACK PAIN SYNDROME 07/02/2009  . BACK PAIN 07/02/2009  . PERIPHERAL EDEMA 07/02/2009  . GROIN PAIN 07/02/2009  . TOBACCO USE, QUIT 07/02/2009  . HEMORRHOIDS, INTERNAL 07/16/2008  . DIVERTICULOSIS OF COLON 07/16/2008    Darrel Hoover  PT 04/10/2019, 7:51 AM  [facility] [address] [place], [place], [address] Phone: [phone]   Fax:  [phone]  Name: [name] MRN: [id number] Date of Birth: August 14, 1956  PHYSICAL THERAPY DISCHARGE SUMMARY  Visits from Start of Care: 3  Current functional level related to goals / functional outcomes: See above She canceled  appointments after this one due to having eye surgery   Remaining deficits: See above   Education / Equipment: HEP Plan: Patient agrees to discharge.  Patient goals were partially met. Patient is being discharged due to the patient's request.  ?????    Pearson Forster PT  06/02/19

## 2019-04-15 ENCOUNTER — Ambulatory Visit: Payer: Medicaid Other

## 2019-04-22 ENCOUNTER — Ambulatory Visit: Payer: Medicaid Other

## 2019-04-24 ENCOUNTER — Ambulatory Visit: Payer: Medicaid Other | Admitting: Physical Therapy

## 2019-04-29 ENCOUNTER — Ambulatory Visit: Payer: Medicaid Other

## 2019-05-01 ENCOUNTER — Encounter: Payer: Medicaid Other | Admitting: Physical Therapy

## 2019-05-08 ENCOUNTER — Encounter: Payer: Medicaid Other | Admitting: Physical Therapy

## 2019-05-13 ENCOUNTER — Encounter: Payer: Medicaid Other | Admitting: Physical Therapy

## 2019-05-15 ENCOUNTER — Encounter: Payer: Medicaid Other | Admitting: Physical Therapy

## 2019-06-18 ENCOUNTER — Ambulatory Visit: Payer: Medicaid Other | Admitting: Obstetrics and Gynecology

## 2019-06-20 ENCOUNTER — Ambulatory Visit: Payer: Medicaid Other | Admitting: Women's Health

## 2019-06-27 ENCOUNTER — Ambulatory Visit: Payer: Medicaid Other | Admitting: Family Medicine

## 2019-07-15 ENCOUNTER — Other Ambulatory Visit: Payer: Self-pay

## 2019-07-15 ENCOUNTER — Other Ambulatory Visit (HOSPITAL_COMMUNITY)
Admission: RE | Admit: 2019-07-15 | Discharge: 2019-07-15 | Disposition: A | Discharge status: Home / Self Care | Payer: BC Managed Care – PPO | Source: Ambulatory Visit | Attending: Obstetrics and Gynecology | Admitting: Obstetrics and Gynecology

## 2019-07-15 ENCOUNTER — Encounter: Payer: Self-pay | Admitting: Obstetrics and Gynecology

## 2019-07-15 ENCOUNTER — Ambulatory Visit (INDEPENDENT_AMBULATORY_CARE_PROVIDER_SITE_OTHER): Payer: Medicaid Other | Admitting: Obstetrics and Gynecology

## 2019-07-15 VITALS — BP 115/57 | HR 63 | Wt 226.9 lb

## 2019-07-15 DIAGNOSIS — N950 Postmenopausal bleeding: Secondary | ICD-10-CM

## 2019-07-15 DIAGNOSIS — Z0000 Encounter for general adult medical examination without abnormal findings: Secondary | ICD-10-CM | POA: Diagnosis not present

## 2019-07-15 DIAGNOSIS — Z202 Contact with and (suspected) exposure to infections with a predominantly sexual mode of transmission: Secondary | ICD-10-CM | POA: Insufficient documentation

## 2019-07-15 DIAGNOSIS — Z01419 Encounter for gynecological examination (general) (routine) without abnormal findings: Secondary | ICD-10-CM | POA: Insufficient documentation

## 2019-07-15 DIAGNOSIS — D259 Leiomyoma of uterus, unspecified: Secondary | ICD-10-CM

## 2019-07-15 NOTE — Progress Notes (Signed)
Patient ID: Drina Goldsmith, female   DOB: Oct 11, 1955, 63 y.o.   MRN: [id number]  Kacia Streifel is a 63 y.o. G1P1 female here for a routine annual gynecologic exam. H/O PMB in 2018. Normal EMBX , uterine fibroid. Started on q 3 month Depo Lupron. Last injection 08/2018. No bleeding while on Lupron and since stopping. Sexual active without problems.  Denies abnormal vaginal bleeding, discharge, pelvic pain, problems with intercourse or other gynecologic concerns.    Gynecologic History Patient's last menstrual period was 02/16/2017 (within weeks). Contraception: post menopausal status Last Pap: 2017. Results were: normal Last mammogram: 10/2018. Results were: normal  Obstetric History OB History  Gravida Para Term Preterm AB Living  1 1       1   SAB TAB Ectopic Multiple Live Births               # Outcome Date GA Lbr Len/2nd Weight Sex Delivery Anes PTL Lv  1 Para             Past Medical History:  Diagnosis Date  . Achilles rupture   . Anxiety   . Arthritis   . Asthma   . Chronic back pain   . Chronic headaches   . Chronic leg pain    bilaterally  . Depression   . Diabetes mellitus 1999   T2DM  . Glaucoma   . H/O gestational diabetes mellitus, not currently pregnant   . Hyperlipidemia   . Hypertension   . Morbid obesity ([place])   . OSA (obstructive sleep apnea)   . Sciatica     Past Surgical History:  Procedure Laterality Date  . BACK SURGERY    . BREAST EXCISIONAL BIOPSY Right   . CERVICAL SPINE SURGERY    . CESAREAN SECTION  12/10/89  . CHOLECYSTECTOMY  1991  . LUMBAR SPINE SURGERY    . RIGHT HEART CATH N/A 12/06/2016   Procedure: Right Heart Cath;  Surgeon: Larey Dresser, MD;  Location: [facility];  Service: Cardiovascular;  Laterality: N/A;  . TUBAL LIGATION      Current Outpatient Medications on File Prior to Visit  Medication Sig Dispense Refill  . acetaminophen (TYLENOL) 500 MG tablet Take 500-1,000 mg by mouth every 6 (six) hours as needed for mild  pain or headache.     . albuterol (PROVENTIL HFA;VENTOLIN HFA) 108 (90 BASE) MCG/ACT inhaler Inhale 2 puffs into the lungs every 6 (six) hours as needed for wheezing or shortness of breath.     Marland Kitchen aspirin EC 81 MG tablet Take 81 mg by mouth daily with breakfast.    . colchicine 0.6 MG tablet Take 0.6 mg by mouth daily.    . dorzolamide-timolol (COSOPT) 22.3-6.8 MG/ML ophthalmic solution Place 1 drop into both eyes 2 (two) times daily.    Marland Kitchen doxazosin (CARDURA) 4 MG tablet Take 4 mg by mouth at bedtime.    . insulin aspart (NOVOLOG FLEXPEN) 100 UNIT/ML FlexPen Inject 3-20 Units into the skin 3 (three) times daily as needed for high blood sugar (CBG 150).    . insulin glargine (LANTUS) 100 unit/mL SOPN Inject 50 Units into the skin daily before breakfast.    . liraglutide (VICTOZA) 18 MG/3ML SOPN Inject into the skin.    Marland Kitchen lisinopril (PRINIVIL,ZESTRIL) 5 MG tablet Take 5 mg by mouth daily with breakfast.   1  . PRESCRIPTION MEDICATION Inhale into the lungs at bedtime. CPAP    . Travoprost, BAK Free, (TRAVATAN) 0.004 % SOLN ophthalmic solution  Place 1 drop into both eyes at bedtime.    . Vitamin D, Ergocalciferol, (DRISDOL) 50000 units CAPS capsule Take 1 capsule (50,000 Units total) by mouth every 7 (seven) days. 30 capsule   . ALPHAGAN P 0.15 % ophthalmic solution 1 drop 2 (two) times daily.    Marland Kitchen atorvastatin (LIPITOR) 10 MG tablet Take 1 tablet (10 mg total) by mouth daily. 90 tablet 3  . HYDROcodone-acetaminophen (NORCO/VICODIN) 5-325 MG tablet Take 1 tablet by mouth every 6 (six) hours as needed for moderate pain. (Patient not taking: Reported on 03/25/2019) 15 tablet 0  . ketorolac (ACULAR) 0.5 % ophthalmic solution INSTILL 1 DROP INTO OD QID    . leuprolide (LUPRON DEPOT, 25-MONTH,) 11.25 MG injection Inject 11.25 mg into the muscle every 3 (three) months. (Patient not taking: Reported on 03/25/2019) 1 each 2  . metolazone (ZAROXOLYN) 2.5 MG tablet Take 1 tablet (2.5 mg total) by mouth as directed.  Must call CHF clinic before taking! [phone]). 5 tablet 0  . tiZANidine (ZANAFLEX) 4 MG tablet Take 4 mg by mouth every 12 (twelve) hours as needed for muscle spasms. Not to exceed 3 doses in 24 hours  0  . torsemide (DEMADEX) 20 MG tablet Take 4 tablets (80 mg total) by mouth daily. (Patient not taking: Reported on 03/04/2018) 120 tablet 11  . torsemide (DEMADEX) 20 MG tablet Take 3 tablets (60 mg total) by mouth as directed. Take 60 mg in the AM. You may take 60 mg in the PM as needed for swelling (Patient not taking: Reported on 03/04/2018) 180 tablet 1   No current facility-administered medications on file prior to visit.     Allergies  Allergen Reactions  . Pork Allergy   . Pork-Derived Products Nausea And Vomiting  . Whey Nausea And Vomiting    Just not in big amounts     Social History   Socioeconomic History  . Marital status: Single    Spouse name: Not on file  . Number of children: Not on file  . Years of education: Not on file  . Highest education level: Not on file  Occupational History  . Not on file  Social Needs  . Financial resource strain: Not on file  . Food insecurity    Worry: Not on file    Inability: Not on file  . Transportation needs    Medical: Not on file    Non-medical: Not on file  Tobacco Use  . Smoking status: Former Smoker    Types: Cigarettes    Quit date: 09/04/1998    Years since quitting: 20.8  . Smokeless tobacco: Never Used  Substance and Sexual Activity  . Alcohol use: Yes    Comment: occasional  . Drug use: No  . Sexual activity: Not Currently    Birth control/protection: None  Lifestyle  . Physical activity    Days per week: Not on file    Minutes per session: Not on file  . Stress: Not on file  Relationships  . Social Herbalist on phone: Not on file    Gets together: Not on file    Attends religious service: Not on file    Active member of club or organization: Not on file    Attends meetings of clubs or  organizations: Not on file    Relationship status: Not on file  . Intimate partner violence    Fear of current or ex partner: Not on file    Emotionally  abused: Not on file    Physically abused: Not on file    Forced sexual activity: Not on file  Other Topics Concern  . Not on file  Social History Narrative  . Not on file    Family History  Problem Relation Age of Onset  . Heart failure Mother   . Renal Disease Mother     The following portions of the patient's history were reviewed and updated as appropriate: allergies, current medications, past family history, past medical history, past social history, past surgical history and problem list.  Review of Systems Pertinent items noted in HPI and remainder of comprehensive ROS otherwise negative.   Objective:  BP (!) 115/57   Pulse 63   Wt 226 lb 14.4 oz (102.9 kg)   LMP 02/16/2017 (Within Weeks) Comment: tubal ligation  BMI 42.52 kg/m  CONSTITUTIONAL: Well-developed, well-nourished female in no acute distress.  HENT:  Normocephalic, atraumatic, External right and left ear normal. Oropharynx is clear and moist EYES: Conjunctivae and EOM are normal. Pupils are equal, round, and reactive to light. No scleral icterus.  NECK: Normal range of motion, supple, no masses.  Normal thyroid.  SKIN: Skin is warm and dry. No rash noted. Not diaphoretic. No erythema. No pallor. [place]: Alert and oriented to person, place, and time. Normal reflexes, muscle tone coordination. No cranial nerve deficit noted. PSYCHIATRIC: Normal mood and affect. Normal behavior. Normal judgment and thought content. CARDIOVASCULAR: Normal heart rate noted, regular rhythm RESPIRATORY: Clear to auscultation bilaterally. Effort and breath sounds normal, no problems with respiration noted. BREASTS: Deferred. ABDOMEN: Soft, normal bowel sounds, no distention noted.  No tenderness, rebound or guarding.  PELVIC: Normal appearing external genitalia; normal appearing  vaginal mucosa and cervix.  No abnormal discharge noted.  Pap smear obtained.  Enlarged uterus 14 weeks, no uterine or adnexal tenderness. Limited by pt habitus.   Assessment:  Annual gynecologic examination with pap smear H/O PMB Uterine fibroids STD exposure Plan:  Will follow up results of pap smear and manage accordingly. No further PMB, Will follow for now. No more Depo Lupron. STD testing as per pt request. Routine preventative health maintenance measures emphasized. Please refer to After Visit Summary for other counseling recommendations.    Chancy Milroy, MD, [place] Attending [place] for [facility], [place]

## 2019-07-15 NOTE — Patient Instructions (Signed)
Health Maintenance, Female Adopting a healthy lifestyle and getting preventive care are important in promoting health and wellness. Ask your health care provider about:  The right schedule for you to have regular tests and exams.  Things you can do on your own to prevent diseases and keep yourself healthy. What should I know about diet, weight, and exercise? Eat a healthy diet   Eat a diet that includes plenty of vegetables, fruits, low-fat dairy products, and lean protein.  Do not eat a lot of foods that are high in solid fats, added sugars, or sodium. Maintain a healthy weight Body mass index (BMI) is used to identify weight problems. It estimates body fat based on height and weight. Your health care provider can help determine your BMI and help you achieve or maintain a healthy weight. Get regular exercise Get regular exercise. This is one of the most important things you can do for your health. Most adults should:  Exercise for at least 150 minutes each week. The exercise should increase your heart rate and make you sweat (moderate-intensity exercise).  Do strengthening exercises at least twice a week. This is in addition to the moderate-intensity exercise.  Spend less time sitting. Even light physical activity can be beneficial. Watch cholesterol and blood lipids Have your blood tested for lipids and cholesterol at 62 years of age, then have this test every 5 years. Have your cholesterol levels checked more often if:  Your lipid or cholesterol levels are high.  You are older than 62 years of age.  You are at high risk for heart disease. What should I know about cancer screening? Depending on your health history and family history, you may need to have cancer screening at various ages. This may include screening for:  Breast cancer.  Cervical cancer.  Colorectal cancer.  Skin cancer.  Lung cancer. What should I know about heart disease, diabetes, and high blood  pressure? Blood pressure and heart disease  High blood pressure causes heart disease and increases the risk of stroke. This is more likely to develop in people who have high blood pressure readings, are of African descent, or are overweight.  Have your blood pressure checked: ? Every 3-5 years if you are 18-39 years of age. ? Every year if you are 40 years old or older. Diabetes Have regular diabetes screenings. This checks your fasting blood sugar level. Have the screening done:  Once every three years after age 40 if you are at a normal weight and have a low risk for diabetes.  More often and at a younger age if you are overweight or have a high risk for diabetes. What should I know about preventing infection? Hepatitis B If you have a higher risk for hepatitis B, you should be screened for this virus. Talk with your health care provider to find out if you are at risk for hepatitis B infection. Hepatitis C Testing is recommended for:  Everyone born from 1945 through 1965.  Anyone with known risk factors for hepatitis C. Sexually transmitted infections (STIs)  Get screened for STIs, including gonorrhea and chlamydia, if: ? You are sexually active and are younger than 62 years of age. ? You are older than 62 years of age and your health care provider tells you that you are at risk for this type of infection. ? Your sexual activity has changed since you were last screened, and you are at increased risk for chlamydia or gonorrhea. Ask your health care provider if   you are at risk.  Ask your health care provider about whether you are at high risk for HIV. Your health care provider may recommend a prescription medicine to help prevent HIV infection. If you choose to take medicine to prevent HIV, you should first get tested for HIV. You should then be tested every 3 months for as long as you are taking the medicine. Pregnancy  If you are about to stop having your period (premenopausal) and  you may become pregnant, seek counseling before you get pregnant.  Take 400 to 800 micrograms (mcg) of folic acid every day if you become pregnant.  Ask for birth control (contraception) if you want to prevent pregnancy. Osteoporosis and menopause Osteoporosis is a disease in which the bones lose minerals and strength with aging. This can result in bone fractures. If you are 65 years old or older, or if you are at risk for osteoporosis and fractures, ask your health care provider if you should:  Be screened for bone loss.  Take a calcium or vitamin D supplement to lower your risk of fractures.  Be given hormone replacement therapy (HRT) to treat symptoms of menopause. Follow these instructions at home: Lifestyle  Do not use any products that contain nicotine or tobacco, such as cigarettes, e-cigarettes, and chewing tobacco. If you need help quitting, ask your health care provider.  Do not use street drugs.  Do not share needles.  Ask your health care provider for help if you need support or information about quitting drugs. Alcohol use  Do not drink alcohol if: ? Your health care provider tells you not to drink. ? You are pregnant, may be pregnant, or are planning to become pregnant.  If you drink alcohol: ? Limit how much you use to 0-1 drink a day. ? Limit intake if you are breastfeeding.  Be aware of how much alcohol is in your drink. In the [place] one drink equals one 12 oz bottle of beer (355 mL), one 5 oz glass of wine (148 mL), or one 1 oz glass of hard liquor (44 mL). General instructions  Schedule regular health, dental, and eye exams.  Stay current with your vaccines.  Tell your health care provider if: ? You often feel depressed. ? You have ever been abused or do not feel safe at home. Summary  Adopting a healthy lifestyle and getting preventive care are important in promoting health and wellness.  Follow your health care provider's instructions about healthy  diet, exercising, and getting tested or screened for diseases.  Follow your health care provider's instructions on monitoring your cholesterol and blood pressure. This information is not intended to replace advice given to you by your health care provider. Make sure you discuss any questions you have with your health care provider. Document Released: 03/20/2011 Document Revised: 08/28/2018 Document Reviewed: 08/28/2018 Elsevier Patient Education  2020 [employer]  

## 2019-07-15 NOTE — Progress Notes (Signed)
Pt presents for annual, pap, and all STD testing. Normal mammogram 09/2018  Unsure last bone density

## 2019-07-16 LAB — HIV ANTIBODY (ROUTINE TESTING W REFLEX): HIV Screen 4th Generation wRfx: NONREACTIVE

## 2019-07-16 LAB — HEPATITIS C ANTIBODY: Hep C Virus Ab: <0.1 s/co ratio (ref 0.0–0.9)

## 2019-07-16 LAB — RPR: RPR Ser Ql: NONREACTIVE

## 2019-07-16 LAB — HEPATITIS B SURFACE ANTIGEN: Hepatitis B Surface Ag: NEGATIVE

## 2019-07-17 LAB — CERVICOVAGINAL ANCILLARY ONLY
Bacterial Vaginitis (gardnerella): POSITIVE — AB
Candida Glabrata: NEGATIVE
Candida Vaginitis: NEGATIVE
Chlamydia: NEGATIVE
Comment: NEGATIVE
Comment: NEGATIVE
Comment: NEGATIVE
Comment: NEGATIVE
Comment: NEGATIVE
Comment: NORMAL
Neisseria Gonorrhea: NEGATIVE
Trichomonas: NEGATIVE

## 2019-07-21 ENCOUNTER — Other Ambulatory Visit: Payer: Self-pay | Admitting: *Deleted

## 2019-07-21 DIAGNOSIS — B9689 Other specified bacterial agents as the cause of diseases classified elsewhere: Secondary | ICD-10-CM

## 2019-07-21 DIAGNOSIS — N760 Acute vaginitis: Secondary | ICD-10-CM

## 2019-07-21 LAB — CYTOLOGY - PAP: Adequacy: ABNORMAL

## 2019-07-21 MED ORDER — METRONIDAZOLE 500 MG PO TABS: 500.0000 mg | ORAL_TABLET | Freq: Two times a day (BID) | ORAL | 0 refills | Status: DC

## 2019-07-21 NOTE — Progress Notes (Signed)
Flagyl sent for +BV See lab note 

## 2019-07-22 ENCOUNTER — Telehealth: Payer: Self-pay

## 2019-07-22 NOTE — Telephone Encounter (Signed)
-----   Message from Chancy Milroy, MD sent at 07/22/2019 10:39 AM EST ----- Pt needs to return for repeat pap smear Not enough cells on last smear to perform pap Any provider is fine Thanks Legrand Como

## 2019-07-22 NOTE — Telephone Encounter (Signed)
  Notified patient of need to repeat PAP. Appt is scheduled for 07/31/19 @ 3:30 pm

## 2019-07-31 ENCOUNTER — Ambulatory Visit: Payer: Medicaid Other | Admitting: Obstetrics

## 2019-08-19 ENCOUNTER — Ambulatory Visit: Payer: Medicaid Other | Admitting: Obstetrics & Gynecology

## 2019-09-02 ENCOUNTER — Telehealth: Payer: Self-pay | Admitting: Obstetrics & Gynecology

## 2019-11-24 ENCOUNTER — Ambulatory Visit: Payer: BC Managed Care – PPO | Attending: Family

## 2019-11-24 DIAGNOSIS — Z20822 Contact with and (suspected) exposure to covid-19: Secondary | ICD-10-CM

## 2019-11-25 LAB — NOVEL CORONAVIRUS, NAA: SARS-CoV-2, NAA: NOT DETECTED

## 2019-12-04 ENCOUNTER — Encounter (HOSPITAL_COMMUNITY): Payer: Self-pay

## 2019-12-04 ENCOUNTER — Ambulatory Visit (HOSPITAL_COMMUNITY)
Admission: EM | Admit: 2019-12-04 | Discharge: 2019-12-04 | Disposition: A | Discharge status: Home / Self Care | Payer: BC Managed Care – PPO | Attending: Family Medicine | Admitting: Family Medicine

## 2019-12-04 ENCOUNTER — Other Ambulatory Visit: Payer: Self-pay

## 2019-12-04 DIAGNOSIS — M1A0720 Idiopathic chronic gout, left ankle and foot, without tophus (tophi): Secondary | ICD-10-CM

## 2019-12-04 MED ORDER — METHYLPREDNISOLONE SODIUM SUCC 125 MG IJ SOLR
INTRAMUSCULAR | Status: AC
  Filled 2019-12-04: qty 2

## 2019-12-04 MED ORDER — TRAMADOL HCL 50 MG PO TABS: 50.0000 mg | ORAL_TABLET | Freq: Four times a day (QID) | ORAL | 0 refills | Status: AC | PRN

## 2019-12-04 MED ORDER — METHYLPREDNISOLONE SODIUM SUCC 125 MG IJ SOLR
80.0000 mg | Freq: Once | INTRAMUSCULAR | Status: AC
Start: 2019-12-04 — End: 2019-12-04
  Administered 2019-12-04: 12:00:00 80 mg via INTRAMUSCULAR

## 2019-12-04 MED ORDER — PREDNISONE 10 MG (21) PO TBPK: ORAL_TABLET | ORAL | 0 refills | Status: DC

## 2019-12-04 NOTE — ED Provider Notes (Signed)
[place]    CSN: [id number] Arrival date & time: 12/04/19  1034      History   Chief Complaint Chief Complaint  Patient presents with  . Foot Pain    HPI [name] is a 64 y.o. female.   Patient is a 64 year old female past medical history of Achilles rupture, anxiety, arthritis, asthma, chronic back pain, chronic headaches, chronic leg pain, depression, diabetes, glaucoma, hypertension, hyperlipidemia, gout, sciatic nerve pain, chronic kidney disease.  She presents today with left great toe and second toe pain.  Describes the pain as throbbing.  this has been constant and worsening over the past few days.  She takes colchicine chronically daily for gout.  Reporting this is worse than typical flares.  Very tender to touch.  She is having trouble walking on the foot.  No injuries to the foot.  No fever, chills, body aches.  ROS per HPI      Past Medical History:  Diagnosis Date  . Achilles rupture   . Anxiety   . Arthritis   . Asthma   . Chronic back pain   . Chronic headaches   . Chronic leg pain    bilaterally  . Depression   . Diabetes mellitus 1999   T2DM  . Glaucoma   . H/O gestational diabetes mellitus, not currently pregnant   . Hyperlipidemia   . Hypertension   . Morbid obesity ([place])   . OSA (obstructive sleep apnea)   . Sciatica     Patient Active Problem List   Diagnosis Date Noted  . Visit for routine gyn exam 07/15/2019  . STD exposure 07/15/2019  . Uterine fibroid 07/15/2019  . Nausea & vomiting 07/01/2017  . Acute gout due to renal impairment involving left ankle   . Hypertensive heart and renal disease   . SOB (shortness of breath)   . Chronic diastolic heart failure ([place]) 11/30/2016  . CKD (chronic kidney disease) stage 3, GFR 30-59 ml/min (H[facility]) 11/30/2016  . Thrombocytopenia ([place]) 11/30/2016  . Sinus bradycardia 11/30/2016  . PMB (postmenopausal bleeding) 05/09/2010  . ANEMIA 05/05/2010  . DEPRESSION 04/26/2010  . TMJ  SYNDROME 04/26/2010  . DEGENERATIVE DISC DISEASE, CERVICAL SPINE 04/26/2010  . CHEST PAIN 04/26/2010  . CHRONIC OBSTRUCTIVE PULMONARY DISEASE, MODERATE 03/26/2010  . MASTALGIA 10/08/2009  . Iron deficiency anemia 07/12/2009  . Diabetes mellitus ([place]) 07/02/2009  . Hyperlipidemia 07/02/2009  . BMI 45.0-49.9, adult ([place]) 07/02/2009  . OSA (obstructive sleep apnea) 07/02/2009  . Glaucoma 07/02/2009  . Hypertension 07/02/2009  . ASTHMA 07/02/2009  . LOW BACK PAIN SYNDROME 07/02/2009  . BACK PAIN 07/02/2009  . PERIPHERAL EDEMA 07/02/2009  . GROIN PAIN 07/02/2009  . TOBACCO USE, QUIT 07/02/2009  . HEMORRHOIDS, INTERNAL 07/16/2008  . DIVERTICULOSIS OF COLON 07/16/2008    Past Surgical History:  Procedure Laterality Date  . BACK SURGERY    . BREAST EXCISIONAL BIOPSY Right   . CERVICAL SPINE SURGERY    . CESAREAN SECTION  12/10/89  . CHOLECYSTECTOMY  1991  . LUMBAR SPINE SURGERY    . RIGHT HEART CATH N/A 12/06/2016   Procedure: Right Heart Cath;  Surgeon: Larey Dresser, MD;  Location: [facility];  Service: Cardiovascular;  Laterality: N/A;  . TUBAL LIGATION      OB History    Gravida  1   Para  1   Term      Preterm      AB      Living  1     SAB      TAB      Ectopic      Multiple      Live Births               Home Medications    Prior to Admission medications   Medication Sig Start Date End Date Taking? Authorizing Provider  acetaminophen (TYLENOL) 500 MG tablet Take 500-1,000 mg by mouth every 6 (six) hours as needed for mild pain or headache.     [provider]  albuterol (PROVENTIL HFA;VENTOLIN HFA) 108 (90 BASE) MCG/ACT inhaler Inhale 2 puffs into the lungs every 6 (six) hours as needed for wheezing or shortness of breath.     Robyn Haber, MD  [facility] P 0.15 % ophthalmic solution 1 drop 2 (two) times daily. 07/03/19   [provider]  aspirin EC 81 MG tablet Take 81 mg by mouth daily with breakfast.    [provider]  atorvastatin (LIPITOR) 10 MG tablet Take 1 tablet (10 mg total) by mouth daily. 09/13/17 01/23/18  Adora Fridge, RPH-CPP  colchicine 0.6 MG tablet Take 0.6 mg by mouth daily.    [provider]  dorzolamide-timolol (COSOPT) 22.3-6.8 MG/ML ophthalmic solution Place 1 drop into both eyes 2 (two) times daily.    [provider]  doxazosin (CARDURA) 4 MG tablet Take 4 mg by mouth at bedtime.    [provider]  HYDROcodone-acetaminophen (NORCO/VICODIN) 5-325 MG tablet Take 1 tablet by mouth every 6 (six) hours as needed for moderate pain. Patient not taking: Reported on 03/25/2019 04/24/18   Lawyer, [place] Gave, PA-C  insulin aspart (NOVOLOG FLEXPEN) 100 UNIT/ML FlexPen Inject 3-20 Units into the skin 3 (three) times daily as needed for high blood sugar (CBG 150).    [provider]  insulin glargine (LANTUS) 100 unit/mL SOPN Inject 50 Units into the skin daily before breakfast.    [provider]  ketorolac (ACULAR) 0.5 % ophthalmic solution INSTILL 1 DROP INTO OD QID 04/22/19   [provider]  leuprolide (LUPRON DEPOT, 63-MONTH,) 11.25 MG injection Inject 11.25 mg into the muscle every 3 (three) months. Patient not taking: Reported on 03/25/2019 05/08/18   Shelly Bombard, MD  liraglutide (VICTOZA) 18 MG/3ML SOPN Inject into the skin.    [provider]  lisinopril (PRINIVIL,ZESTRIL) 5 MG tablet Take 5 mg by mouth daily with breakfast.  06/18/17   [provider]  metolazone (ZAROXOLYN) 2.5 MG tablet Take 1 tablet (2.5 mg total) by mouth as directed. Must call CHF clinic before taking! [phone]). 10/09/17 01/07/18  Shirley Friar, PA-C  predniSONE (STERAPRED UNI-PAK 21 TAB) 10 MG (21) TBPK tablet 6 tabs for 1 day, then 5 tabs for 1 das, then 4 tabs for 1 day, then 3 tabs for 1 day, 2 tabs for 1 day, then 1 tab for 1 day 12/04/19   Loura Halt A, NP  PRESCRIPTION MEDICATION Inhale into the lungs at bedtime.  CPAP    [provider]  tiZANidine (ZANAFLEX) 4 MG tablet Take 4 mg by mouth every 12 (twelve) hours as needed for muscle spasms. Not to exceed 3 doses in 24 hours 06/22/17   [provider]  torsemide (DEMADEX) 20 MG tablet Take 4 tablets (80 mg total) by mouth daily. Patient not taking: Reported on 03/04/2018 10/22/17   Shirley Friar, PA-C  torsemide (DEMADEX) 20 MG tablet Take 3 tablets (60 mg total) by mouth as directed.  Take 60 mg in the AM. You may take 60 mg in the PM as needed for swelling Patient not taking: Reported on 03/04/2018 12/04/17   Josue Hector, MD  traMADol (ULTRAM) 50 MG tablet Take 1 tablet (50 mg total) by mouth every 6 (six) hours as needed for up to 3 days. 12/04/19 12/07/19  Loura Halt A, NP  Travoprost, BAK Free, (TRAVATAN) 0.004 % SOLN ophthalmic solution Place 1 drop into both eyes at bedtime.    [provider]  Vitamin D, Ergocalciferol, (DRISDOL) 50000 units CAPS capsule Take 1 capsule (50,000 Units total) by mouth every 7 (seven) days. 09/13/17   Adora Fridge, RPH-CPP    Family History Family History  Problem Relation Age of Onset  . Heart failure Mother   . Renal Disease Mother     Social History Social History   Tobacco Use  . Smoking status: Former Smoker    Types: Cigarettes    Quit date: 09/04/1998    Years since quitting: 21.2  . Smokeless tobacco: Never Used  Substance Use Topics  . Alcohol use: Yes    Comment: occasional  . Drug use: No     Allergies   Pork allergy, Pork-derived products, and Whey   Review of Systems Review of Systems   Physical Exam Triage Vital Signs ED Triage Vitals [12/04/19 1126]  Enc Vitals Group     BP      Pulse Rate (!) 59     Resp 17     Temp 97.9 F (36.6 C)     Temp Source Oral     SpO2 100 %     Weight      Height      Head Circumference      Peak Flow      Pain Score 10     Pain Loc      Pain Edu?      Excl. in [place]?    No data found.  Updated  Vital Signs Pulse (!) 59   Temp 97.9 F (36.6 C) (Oral)   Resp 17   LMP 02/16/2017 (Within Weeks) Comment: tubal ligation  SpO2 100%   Visual Acuity Right Eye Distance:   Left Eye Distance:   Bilateral Distance:    Right Eye Near:   Left Eye Near:    Bilateral Near:     Physical Exam Constitutional:      General: She is not in acute distress.    Appearance: She is not ill-appearing, toxic-appearing or diaphoretic.     Comments: Tearful   HENT:     Head: Normocephalic.     Nose: Nose normal.  Eyes:     Conjunctiva/sclera: Conjunctivae normal.  Pulmonary:     Effort: Pulmonary effort is normal.  Musculoskeletal:     Cervical back: Normal range of motion.     Comments: Swelling and tenderness to left great toe and 2nd toe, worse in second toe.  Mild erythema.   Skin:    General: Skin is warm and dry.  Neurological:     Mental Status: She is alert.  Psychiatric:        Mood and Affect: Mood normal.      UC Treatments / Results  Labs (all labs ordered are listed, but only abnormal results are displayed) Labs Reviewed - No data to display  EKG   Radiology No results found.  Procedures Procedures (including critical care time)  Medications Ordered in UC Medications  methylPREDNISolone sodium  succinate (SOLU-MEDROL) 125 mg/2 mL injection 80 mg (80 mg Intramuscular Given 12/04/19 1151)    Initial Impression / Assessment and Plan / UC Course  I have reviewed the triage vital signs and the nursing notes.  Pertinent labs & imaging results that were available during my care of the patient were reviewed by me and considered in my medical decision making (see chart for details).     Gout-most likely diagnosis.10/10 pain.  Pt refused narcotic pain medication here. Requesting steroids.  Due to decreased kidney function will treat with steroids.  Will give steroid injection here in clinic.  We will have her start prednisone taper tomorrow. Tramadol for severe  pain Recommended to watch for increase in blood sugars Follow-up with your primary care as needed Final Clinical Impressions(s) / UC Diagnoses   Final diagnoses:  None     Discharge Instructions     Steroid injection given here You can start the prednisone taper tomorrow Make sure that you watch you blood sugars.  Tramadol for severe pain Follow up as needed for continued or worsening symptoms     ED Prescriptions    Medication Sig Dispense Auth. Provider   predniSONE (STERAPRED UNI-PAK 21 TAB) 10 MG (21) TBPK tablet 6 tabs for 1 day, then 5 tabs for 1 das, then 4 tabs for 1 day, then 3 tabs for 1 day, 2 tabs for 1 day, then 1 tab for 1 day 21 tablet Zulema [place] A, NP   traMADol (ULTRAM) 50 MG tablet Take 1 tablet (50 mg total) by mouth every 6 (six) hours as needed for up to 3 days. 12 tablet Elfego Giammarino A, NP     I have reviewed the PDMP during this encounter.   Orvan July, NP 12/04/19 1202

## 2019-12-04 NOTE — ED Triage Notes (Signed)
Pt presents with left foot/toe pain associated with gout; pt states she has been taking her medicine regularly.

## 2019-12-04 NOTE — Discharge Instructions (Addendum)
Steroid injection given here You can start the prednisone taper tomorrow Make sure that you watch you blood sugars.  Tramadol for severe pain Follow up as needed for continued or worsening symptoms

## 2020-01-04 ENCOUNTER — Encounter (HOSPITAL_COMMUNITY): Payer: Self-pay | Admitting: Emergency Medicine

## 2020-01-04 ENCOUNTER — Inpatient Hospital Stay (HOSPITAL_COMMUNITY)
Admission: EM | Admit: 2020-01-04 | Discharge: 2020-01-09 | DRG: 253 | Disposition: A | Discharge status: Home Health | Payer: BC Managed Care – PPO | Attending: Internal Medicine | Admitting: Internal Medicine

## 2020-01-04 ENCOUNTER — Other Ambulatory Visit: Payer: Self-pay

## 2020-01-04 DIAGNOSIS — Z6841 Body Mass Index (BMI) 40.0 and over, adult: Secondary | ICD-10-CM

## 2020-01-04 DIAGNOSIS — M79672 Pain in left foot: Secondary | ICD-10-CM | POA: Diagnosis present

## 2020-01-04 DIAGNOSIS — Z8249 Family history of ischemic heart disease and other diseases of the circulatory system: Secondary | ICD-10-CM

## 2020-01-04 DIAGNOSIS — M79673 Pain in unspecified foot: Secondary | ICD-10-CM

## 2020-01-04 DIAGNOSIS — E1122 Type 2 diabetes mellitus with diabetic chronic kidney disease: Secondary | ICD-10-CM | POA: Diagnosis present

## 2020-01-04 DIAGNOSIS — D631 Anemia in chronic kidney disease: Secondary | ICD-10-CM | POA: Diagnosis present

## 2020-01-04 DIAGNOSIS — F419 Anxiety disorder, unspecified: Secondary | ICD-10-CM | POA: Diagnosis present

## 2020-01-04 DIAGNOSIS — Y838 Other surgical procedures as the cause of abnormal reaction of the patient, or of later complication, without mention of misadventure at the time of the procedure: Secondary | ICD-10-CM | POA: Diagnosis not present

## 2020-01-04 DIAGNOSIS — I70222 Atherosclerosis of native arteries of extremities with rest pain, left leg: Secondary | ICD-10-CM | POA: Diagnosis present

## 2020-01-04 DIAGNOSIS — E1165 Type 2 diabetes mellitus with hyperglycemia: Secondary | ICD-10-CM | POA: Diagnosis present

## 2020-01-04 DIAGNOSIS — N390 Urinary tract infection, site not specified: Secondary | ICD-10-CM | POA: Diagnosis not present

## 2020-01-04 DIAGNOSIS — G4733 Obstructive sleep apnea (adult) (pediatric): Secondary | ICD-10-CM | POA: Diagnosis present

## 2020-01-04 DIAGNOSIS — E7800 Pure hypercholesterolemia, unspecified: Secondary | ICD-10-CM | POA: Diagnosis present

## 2020-01-04 DIAGNOSIS — Z79899 Other long term (current) drug therapy: Secondary | ICD-10-CM

## 2020-01-04 DIAGNOSIS — B9620 Unspecified Escherichia coli [E. coli] as the cause of diseases classified elsewhere: Secondary | ICD-10-CM | POA: Diagnosis not present

## 2020-01-04 DIAGNOSIS — I998 Other disorder of circulatory system: Secondary | ICD-10-CM | POA: Diagnosis present

## 2020-01-04 DIAGNOSIS — B029 Zoster without complications: Secondary | ICD-10-CM | POA: Diagnosis present

## 2020-01-04 DIAGNOSIS — I5033 Acute on chronic diastolic (congestive) heart failure: Secondary | ICD-10-CM | POA: Diagnosis present

## 2020-01-04 DIAGNOSIS — Z794 Long term (current) use of insulin: Secondary | ICD-10-CM

## 2020-01-04 DIAGNOSIS — I10 Essential (primary) hypertension: Secondary | ICD-10-CM | POA: Diagnosis present

## 2020-01-04 DIAGNOSIS — IMO0002 Reserved for concepts with insufficient information to code with codable children: Secondary | ICD-10-CM

## 2020-01-04 DIAGNOSIS — F329 Major depressive disorder, single episode, unspecified: Secondary | ICD-10-CM | POA: Diagnosis present

## 2020-01-04 DIAGNOSIS — I130 Hypertensive heart and chronic kidney disease with heart failure and stage 1 through stage 4 chronic kidney disease, or unspecified chronic kidney disease: Secondary | ICD-10-CM | POA: Diagnosis present

## 2020-01-04 DIAGNOSIS — N179 Acute kidney failure, unspecified: Secondary | ICD-10-CM | POA: Diagnosis present

## 2020-01-04 DIAGNOSIS — M79675 Pain in left toe(s): Principal | ICD-10-CM

## 2020-01-04 DIAGNOSIS — H409 Unspecified glaucoma: Secondary | ICD-10-CM | POA: Diagnosis present

## 2020-01-04 DIAGNOSIS — J449 Chronic obstructive pulmonary disease, unspecified: Secondary | ICD-10-CM | POA: Diagnosis present

## 2020-01-04 DIAGNOSIS — Z87891 Personal history of nicotine dependence: Secondary | ICD-10-CM

## 2020-01-04 DIAGNOSIS — G8929 Other chronic pain: Secondary | ICD-10-CM | POA: Diagnosis present

## 2020-01-04 DIAGNOSIS — Z9049 Acquired absence of other specified parts of digestive tract: Secondary | ICD-10-CM

## 2020-01-04 DIAGNOSIS — N189 Chronic kidney disease, unspecified: Secondary | ICD-10-CM

## 2020-01-04 DIAGNOSIS — Z7289 Other problems related to lifestyle: Secondary | ICD-10-CM

## 2020-01-04 DIAGNOSIS — Z20822 Contact with and (suspected) exposure to covid-19: Secondary | ICD-10-CM | POA: Diagnosis present

## 2020-01-04 DIAGNOSIS — N1832 Chronic kidney disease, stage 3b: Secondary | ICD-10-CM | POA: Diagnosis present

## 2020-01-04 DIAGNOSIS — N183 Chronic kidney disease, stage 3 unspecified: Secondary | ICD-10-CM | POA: Diagnosis present

## 2020-01-04 DIAGNOSIS — Z841 Family history of disorders of kidney and ureter: Secondary | ICD-10-CM

## 2020-01-04 DIAGNOSIS — D696 Thrombocytopenia, unspecified: Secondary | ICD-10-CM | POA: Diagnosis present

## 2020-01-04 DIAGNOSIS — E785 Hyperlipidemia, unspecified: Secondary | ICD-10-CM | POA: Diagnosis present

## 2020-01-04 DIAGNOSIS — M109 Gout, unspecified: Secondary | ICD-10-CM | POA: Diagnosis present

## 2020-01-04 DIAGNOSIS — Z1611 Resistance to penicillins: Secondary | ICD-10-CM | POA: Diagnosis not present

## 2020-01-04 DIAGNOSIS — E1151 Type 2 diabetes mellitus with diabetic peripheral angiopathy without gangrene: Secondary | ICD-10-CM | POA: Diagnosis not present

## 2020-01-04 DIAGNOSIS — I9752 Accidental puncture and laceration of a circulatory system organ or structure during other procedure: Secondary | ICD-10-CM | POA: Diagnosis not present

## 2020-01-04 DIAGNOSIS — I5032 Chronic diastolic (congestive) heart failure: Secondary | ICD-10-CM | POA: Diagnosis present

## 2020-01-04 DIAGNOSIS — Z7982 Long term (current) use of aspirin: Secondary | ICD-10-CM

## 2020-01-04 DIAGNOSIS — Z91018 Allergy to other foods: Secondary | ICD-10-CM

## 2020-01-04 HISTORY — DX: Gout, unspecified: M10.9

## 2020-01-04 LAB — COMPREHENSIVE METABOLIC PANEL
ALT: 36 U/L (ref 0–44)
AST: 29 U/L (ref 15–41)
Albumin: 3.5 g/dL (ref 3.5–5.0)
Alkaline Phosphatase: 58 U/L (ref 38–126)
Anion gap: 8 (ref 5–15)
BUN: 47 mg/dL — ABNORMAL HIGH (ref 8–23)
CO2: 21 mmol/L — ABNORMAL LOW (ref 22–32)
Calcium: 8.9 mg/dL (ref 8.9–10.3)
Chloride: 108 mmol/L (ref 98–111)
Creatinine, Ser: 2.02 mg/dL — ABNORMAL HIGH (ref 0.44–1.00)
GFR calc Af Amer: 30 mL/min — ABNORMAL LOW (ref >60)
GFR calc non Af Amer: 26 mL/min — ABNORMAL LOW (ref >60)
Glucose, Bld: 189 mg/dL — ABNORMAL HIGH (ref 70–99)
Potassium: 4.4 mmol/L (ref 3.5–5.1)
Sodium: 137 mmol/L (ref 135–145)
Total Bilirubin: 0.5 mg/dL (ref 0.3–1.2)
Total Protein: 6.7 g/dL (ref 6.5–8.1)

## 2020-01-04 LAB — CBC WITH DIFFERENTIAL/PLATELET
Abs Immature Granulocytes: 0.03 10*3/uL (ref 0.00–0.07)
Basophils Absolute: 0 10*3/uL (ref 0.0–0.1)
Basophils Relative: 1 %
Eosinophils Absolute: 0.2 10*3/uL (ref 0.0–0.5)
Eosinophils Relative: 4 %
HCT: 38.3 % (ref 36.0–46.0)
Hemoglobin: 12.3 g/dL (ref 12.0–15.0)
Immature Granulocytes: 1 %
Lymphocytes Relative: 33 %
Lymphs Abs: 1.9 10*3/uL (ref 0.7–4.0)
MCH: 30.6 pg (ref 26.0–34.0)
MCHC: 32.1 g/dL (ref 30.0–36.0)
MCV: 95.3 fL (ref 80.0–100.0)
Monocytes Absolute: 0.6 10*3/uL (ref 0.1–1.0)
Monocytes Relative: 11 %
Neutro Abs: 3 10*3/uL (ref 1.7–7.7)
Neutrophils Relative %: 50 %
Platelets: 131 10*3/uL — ABNORMAL LOW (ref 150–400)
RBC: 4.02 MIL/uL (ref 3.87–5.11)
RDW: 17.5 % — ABNORMAL HIGH (ref 11.5–15.5)
WBC: 5.8 10*3/uL (ref 4.0–10.5)
nRBC: 0 % (ref 0.0–0.2)

## 2020-01-04 LAB — CBG MONITORING, ED: Glucose-Capillary: 184 mg/dL — ABNORMAL HIGH (ref 70–99)

## 2020-01-04 LAB — LACTIC ACID, PLASMA: Lactic Acid, Venous: 1 mmol/L (ref 0.5–1.9)

## 2020-01-04 MED ORDER — MORPHINE SULFATE (PF) 4 MG/ML IV SOLN
4.0000 mg | Freq: Once | INTRAVENOUS | Status: AC
  Administered 2020-01-04: 4 mg via INTRAVENOUS
  Filled 2020-01-04: qty 1

## 2020-01-04 MED ORDER — ONDANSETRON HCL 4 MG/2ML IJ SOLN
4.0000 mg | Freq: Once | INTRAMUSCULAR | Status: AC
  Administered 2020-01-04: 23:00:00 4 mg via INTRAVENOUS
  Filled 2020-01-04: qty 2

## 2020-01-04 NOTE — ED Triage Notes (Signed)
C/o gout to L foot.  States PCP increased her medication and she has been to [facility] and received steroid injections with no improvement.

## 2020-01-04 NOTE — ED Provider Notes (Signed)
[facility] EMERGENCY DEPARTMENT Provider Note   CSN: [id number] Arrival date & time: 01/04/20  1637     History Chief Complaint  Patient presents with  . Gout    [name] is a 64 y.o. female who presents to the ED with cc of Left toe pain.  The past medical history of obesity, COPD, stage III kidney disease, diabetes, hypertension and hyperlipidemia this has been ongoing for 1 month.  She presented to the urgent care 1 month ago with pain in her left foot especially the 3 toes.  She was taking colchicine which was not helping.  She was started on prednisone.  She also followed up with her primary care physician who doubled her colchicine and give her another round of prednisone.  She has had severe pain.  She states she is unable to sleep at night.  She complains of her toes feeling very cold to the touch.  She denies any leg pain but does have pain in the posterior medial aspect of her ankle which is new.  She denies fevers, chills, injury.   HPI     Past Medical History:  Diagnosis Date  . Achilles rupture   . Anxiety   . Arthritis   . Asthma   . Chronic back pain   . Chronic headaches   . Chronic leg pain    bilaterally  . Depression   . Diabetes mellitus 1999   T2DM  . Glaucoma   . Gout   . H/O gestational diabetes mellitus, not currently pregnant   . Hyperlipidemia   . Hypertension   . Morbid obesity ([place])   . OSA (obstructive sleep apnea)   . Sciatica     Patient Active Problem List   Diagnosis Date Noted  . Visit for routine gyn exam 07/15/2019  . STD exposure 07/15/2019  . Uterine fibroid 07/15/2019  . Nausea & vomiting 07/01/2017  . Acute gout due to renal impairment involving left ankle   . Hypertensive heart and renal disease   . SOB (shortness of breath)   . Chronic diastolic heart failure ([place]) 11/30/2016  . CKD (chronic kidney disease) stage 3, GFR 30-59 ml/min (HCC) 11/30/2016  . Thrombocytopenia ([place]) 11/30/2016  . Sinus  bradycardia 11/30/2016  . PMB (postmenopausal bleeding) 05/09/2010  . ANEMIA 05/05/2010  . DEPRESSION 04/26/2010  . TMJ SYNDROME 04/26/2010  . DEGENERATIVE DISC DISEASE, CERVICAL SPINE 04/26/2010  . CHEST PAIN 04/26/2010  . CHRONIC OBSTRUCTIVE PULMONARY DISEASE, MODERATE 03/26/2010  . MASTALGIA 10/08/2009  . Iron deficiency anemia 07/12/2009  . Diabetes mellitus ([place]) 07/02/2009  . Hyperlipidemia 07/02/2009  . BMI 45.0-49.9, adult ([place]) 07/02/2009  . OSA (obstructive sleep apnea) 07/02/2009  . Glaucoma 07/02/2009  . Hypertension 07/02/2009  . ASTHMA 07/02/2009  . LOW BACK PAIN SYNDROME 07/02/2009  . BACK PAIN 07/02/2009  . PERIPHERAL EDEMA 07/02/2009  . GROIN PAIN 07/02/2009  . TOBACCO USE, QUIT 07/02/2009  . HEMORRHOIDS, INTERNAL 07/16/2008  . DIVERTICULOSIS OF COLON 07/16/2008    Past Surgical History:  Procedure Laterality Date  . BACK SURGERY    . BREAST EXCISIONAL BIOPSY Right   . CERVICAL SPINE SURGERY    . CESAREAN SECTION  12/10/89  . CHOLECYSTECTOMY  1991  . LUMBAR SPINE SURGERY    . RIGHT HEART CATH N/A 12/06/2016   Procedure: Right Heart Cath;  Surgeon: Larey Dresser, MD;  Location: [facility];  Service: Cardiovascular;  Laterality: N/A;  . TUBAL LIGATION  OB History    Gravida  1   Para  1   Term      Preterm      AB      Living  1     SAB      TAB      Ectopic      Multiple      Live Births              Family History  Problem Relation Age of Onset  . Heart failure Mother   . Renal Disease Mother     Social History   Tobacco Use  . Smoking status: Former Smoker    Types: Cigarettes    Quit date: 09/04/1998    Years since quitting: 21.3  . Smokeless tobacco: Never Used  Substance Use Topics  . Alcohol use: Yes    Comment: occasional  . Drug use: No    Home Medications Prior to Admission medications   Medication Sig Start Date End Date Taking? Authorizing Provider  acetaminophen (TYLENOL) 500 MG tablet  Take 500-1,000 mg by mouth every 6 (six) hours as needed for mild pain or headache.     [provider]  albuterol (PROVENTIL HFA;VENTOLIN HFA) 108 (90 BASE) MCG/ACT inhaler Inhale 2 puffs into the lungs every 6 (six) hours as needed for wheezing or shortness of breath.     Robyn Haber, MD  [facility] P 0.15 % ophthalmic solution 1 drop 2 (two) times daily. 07/03/19   [provider]  aspirin EC 81 MG tablet Take 81 mg by mouth daily with breakfast.    [provider]  atorvastatin (LIPITOR) 10 MG tablet Take 1 tablet (10 mg total) by mouth daily. 09/13/17 01/23/18  Adora Fridge, RPH-CPP  colchicine 0.6 MG tablet Take 0.6 mg by mouth daily.    [provider]  dorzolamide-timolol (COSOPT) 22.3-6.8 MG/ML ophthalmic solution Place 1 drop into both eyes 2 (two) times daily.    [provider]  doxazosin (CARDURA) 4 MG tablet Take 4 mg by mouth at bedtime.    [provider]  HYDROcodone-acetaminophen (NORCO/VICODIN) 5-325 MG tablet Take 1 tablet by mouth every 6 (six) hours as needed for moderate pain. Patient not taking: Reported on 03/25/2019 04/24/18   Lawyer, [place] Gave, PA-C  insulin aspart (NOVOLOG FLEXPEN) 100 UNIT/ML FlexPen Inject 3-20 Units into the skin 3 (three) times daily as needed for high blood sugar (CBG 150).    [provider]  insulin glargine (LANTUS) 100 unit/mL SOPN Inject 50 Units into the skin daily before breakfast.    [provider]  ketorolac (ACULAR) 0.5 % ophthalmic solution INSTILL 1 DROP INTO OD QID 04/22/19   [provider]  leuprolide (LUPRON DEPOT, 51-MONTH,) 11.25 MG injection Inject 11.25 mg into the muscle every 3 (three) months. Patient not taking: Reported on 03/25/2019 05/08/18   Shelly Bombard, MD  liraglutide (VICTOZA) 18 MG/3ML SOPN Inject into the skin.    [provider]  lisinopril (PRINIVIL,ZESTRIL) 5 MG tablet Take 5 mg by mouth daily with breakfast.  06/18/17    [provider]  metolazone (ZAROXOLYN) 2.5 MG tablet Take 1 tablet (2.5 mg total) by mouth as directed. Must call CHF clinic before taking! [phone]). 10/09/17 01/07/18  Shirley Friar, PA-C  predniSONE (STERAPRED UNI-PAK 21 TAB) 10 MG (21) TBPK tablet 6 tabs for 1 day, then 5 tabs for 1 das, then 4 tabs for 1 day, then 3 tabs for 1 day,  2 tabs for 1 day, then 1 tab for 1 day 12/04/19   Loura Halt A, NP  PRESCRIPTION MEDICATION Inhale into the lungs at bedtime. CPAP    [provider]  tiZANidine (ZANAFLEX) 4 MG tablet Take 4 mg by mouth every 12 (twelve) hours as needed for muscle spasms. Not to exceed 3 doses in 24 hours 06/22/17   [provider]  torsemide (DEMADEX) 20 MG tablet Take 4 tablets (80 mg total) by mouth daily. Patient not taking: Reported on 03/04/2018 10/22/17   Shirley Friar, PA-C  torsemide (DEMADEX) 20 MG tablet Take 3 tablets (60 mg total) by mouth as directed. Take 60 mg in the AM. You may take 60 mg in the PM as needed for swelling Patient not taking: Reported on 03/04/2018 12/04/17   Josue Hector, MD  Travoprost, BAK Free, (TRAVATAN) 0.004 % SOLN ophthalmic solution Place 1 drop into both eyes at bedtime.    [provider]  Vitamin D, Ergocalciferol, (DRISDOL) 50000 units CAPS capsule Take 1 capsule (50,000 Units total) by mouth every 7 (seven) days. 09/13/17   Adora Fridge, RPH-CPP    Allergies    Pork allergy, Pork-derived products, and Whey  Review of Systems   Review of Systems Ten systems reviewed and are negative for acute change, except as noted in the HPI.   Physical Exam Updated Vital Signs BP (!) 139/123 (BP Location: Right Arm)   Pulse 76   Temp 98.3 F (36.8 C) (Oral)   Resp 17   Ht 5\' 1"  (1.549 m)   Wt 102.5 kg   LMP 02/16/2017 (Within Weeks) Comment: tubal ligation  SpO2 98%   BMI 42.70 kg/m   Physical Exam Vitals and nursing note reviewed.  Constitutional:      General: She is  not in acute distress.    Appearance: She is well-developed. She is not diaphoretic.  HENT:     Head: Normocephalic and atraumatic.  Eyes:     General: No scleral icterus.    Conjunctiva/sclera: Conjunctivae normal.  Cardiovascular:     Rate and Rhythm: Normal rate and regular rhythm.     Heart sounds: Normal heart sounds. No murmur. No friction rub. No gallop.   Pulmonary:     Effort: Pulmonary effort is normal. No respiratory distress.     Breath sounds: Normal breath sounds.  Abdominal:     General: Bowel sounds are normal. There is no distension.     Palpations: Abdomen is soft. There is no mass.     Tenderness: There is no abdominal tenderness. There is no guarding.  Musculoskeletal:     Cervical back: Normal range of motion.     Comments: DP pulse present in the left foot by auscultation. Unable to palpate of hear PT pulse.  The first 3 toes of the left foot are cold to the touch and are comparatively much colder than corresponding toes on the right foot.  Capillary refill is delayed to 6 seconds in the big toe.  Skin:    General: Skin is warm and dry.  Neurological:     Mental Status: She is alert and oriented to person, place, and time.  Psychiatric:        Behavior: Behavior normal.     ED Results / Procedures / Treatments   Labs (all labs ordered are listed, but only abnormal results are displayed) Labs Reviewed  CBG MONITORING, ED - Abnormal; Notable for the following components:  Result Value   Glucose-Capillary 184 (*)    All other components within normal limits  CULTURE, BLOOD (ROUTINE X 2)  CULTURE, BLOOD (ROUTINE X 2)  CBC WITH DIFFERENTIAL/PLATELET  COMPREHENSIVE METABOLIC PANEL  LACTIC ACID, PLASMA    EKG None  Radiology No results found.  Procedures Procedures (including critical care time)  Medications Ordered in ED Medications  morphine 4 MG/ML injection 4 mg (4 mg Intravenous Given 01/04/20 2234)  ondansetron (ZOFRAN) injection 4 mg (4  mg Intravenous Given 01/04/20 2234)    ED Course  I have reviewed the triage vital signs and the nursing notes.  Pertinent labs & imaging results that were available during my care of the patient were reviewed by me and considered in my medical decision making (see chart for details).    MDM Rules/Calculators/A&P                      This is a 64 year old female who presents with severe pain in her toes.  Toes are cold.  Was able to Doppler a pulse in the foot.  I personally ordered reviewed and interpreted her labs.  Her CMP shows elevated blood glucose, creatinine of 2.02, elevated BUN and GFR of 30.  She has a CBC without elevated white blood cell count.  Blood cultures pending.  Covid test sent. Fortunately am unable to obtain a CT angiogram due to the patient's kidney chronic kidney disease.  I consulted with Dr.DeHay of radiology to discussed risks of kidney injury with contrast.  I also discussed the case with Dr. Carlis Abbott.  Will admit the patient for pain control and observation, ABIs and vascular duplex in the morning.  Patient's pain is controlled at this time on morphine. Final Clinical Impression(s) / ED Diagnoses Final diagnoses:  None    Rx / DC Orders ED Discharge Orders    None       Margarita Mail, PA-C 01/05/20 0034    Tegeler, Gwenyth Allegra, MD 01/05/20 [phone]

## 2020-01-05 ENCOUNTER — Emergency Department (HOSPITAL_COMMUNITY): Payer: BC Managed Care – PPO

## 2020-01-05 ENCOUNTER — Observation Stay (HOSPITAL_COMMUNITY): Payer: BC Managed Care – PPO

## 2020-01-05 ENCOUNTER — Encounter (HOSPITAL_COMMUNITY): Payer: Self-pay | Admitting: Internal Medicine

## 2020-01-05 DIAGNOSIS — Z6841 Body Mass Index (BMI) 40.0 and over, adult: Secondary | ICD-10-CM | POA: Diagnosis not present

## 2020-01-05 DIAGNOSIS — I998 Other disorder of circulatory system: Secondary | ICD-10-CM

## 2020-01-05 DIAGNOSIS — I5032 Chronic diastolic (congestive) heart failure: Secondary | ICD-10-CM

## 2020-01-05 DIAGNOSIS — B029 Zoster without complications: Secondary | ICD-10-CM | POA: Diagnosis present

## 2020-01-05 DIAGNOSIS — F329 Major depressive disorder, single episode, unspecified: Secondary | ICD-10-CM | POA: Diagnosis present

## 2020-01-05 DIAGNOSIS — E119 Type 2 diabetes mellitus without complications: Secondary | ICD-10-CM | POA: Diagnosis not present

## 2020-01-05 DIAGNOSIS — N183 Chronic kidney disease, stage 3 unspecified: Secondary | ICD-10-CM | POA: Diagnosis not present

## 2020-01-05 DIAGNOSIS — E7800 Pure hypercholesterolemia, unspecified: Secondary | ICD-10-CM | POA: Diagnosis present

## 2020-01-05 DIAGNOSIS — R209 Unspecified disturbances of skin sensation: Secondary | ICD-10-CM | POA: Diagnosis not present

## 2020-01-05 DIAGNOSIS — E1122 Type 2 diabetes mellitus with diabetic chronic kidney disease: Secondary | ICD-10-CM | POA: Diagnosis present

## 2020-01-05 DIAGNOSIS — I10 Essential (primary) hypertension: Secondary | ICD-10-CM

## 2020-01-05 DIAGNOSIS — I70222 Atherosclerosis of native arteries of extremities with rest pain, left leg: Secondary | ICD-10-CM | POA: Diagnosis present

## 2020-01-05 DIAGNOSIS — Y838 Other surgical procedures as the cause of abnormal reaction of the patient, or of later complication, without mention of misadventure at the time of the procedure: Secondary | ICD-10-CM | POA: Diagnosis not present

## 2020-01-05 DIAGNOSIS — Z1611 Resistance to penicillins: Secondary | ICD-10-CM | POA: Diagnosis not present

## 2020-01-05 DIAGNOSIS — E1165 Type 2 diabetes mellitus with hyperglycemia: Secondary | ICD-10-CM

## 2020-01-05 DIAGNOSIS — N1832 Chronic kidney disease, stage 3b: Secondary | ICD-10-CM | POA: Diagnosis present

## 2020-01-05 DIAGNOSIS — H409 Unspecified glaucoma: Secondary | ICD-10-CM | POA: Diagnosis present

## 2020-01-05 DIAGNOSIS — I130 Hypertensive heart and chronic kidney disease with heart failure and stage 1 through stage 4 chronic kidney disease, or unspecified chronic kidney disease: Secondary | ICD-10-CM | POA: Diagnosis present

## 2020-01-05 DIAGNOSIS — J449 Chronic obstructive pulmonary disease, unspecified: Secondary | ICD-10-CM | POA: Diagnosis present

## 2020-01-05 DIAGNOSIS — G8929 Other chronic pain: Secondary | ICD-10-CM | POA: Diagnosis present

## 2020-01-05 DIAGNOSIS — Z794 Long term (current) use of insulin: Secondary | ICD-10-CM

## 2020-01-05 DIAGNOSIS — E1151 Type 2 diabetes mellitus with diabetic peripheral angiopathy without gangrene: Secondary | ICD-10-CM | POA: Diagnosis present

## 2020-01-05 DIAGNOSIS — M109 Gout, unspecified: Secondary | ICD-10-CM | POA: Diagnosis present

## 2020-01-05 DIAGNOSIS — M79672 Pain in left foot: Secondary | ICD-10-CM | POA: Diagnosis present

## 2020-01-05 DIAGNOSIS — D696 Thrombocytopenia, unspecified: Secondary | ICD-10-CM | POA: Diagnosis present

## 2020-01-05 DIAGNOSIS — M79673 Pain in unspecified foot: Secondary | ICD-10-CM | POA: Diagnosis present

## 2020-01-05 DIAGNOSIS — N390 Urinary tract infection, site not specified: Secondary | ICD-10-CM | POA: Diagnosis not present

## 2020-01-05 DIAGNOSIS — M48061 Spinal stenosis, lumbar region without neurogenic claudication: Secondary | ICD-10-CM | POA: Insufficient documentation

## 2020-01-05 DIAGNOSIS — E785 Hyperlipidemia, unspecified: Secondary | ICD-10-CM | POA: Diagnosis present

## 2020-01-05 DIAGNOSIS — N179 Acute kidney failure, unspecified: Secondary | ICD-10-CM | POA: Diagnosis present

## 2020-01-05 DIAGNOSIS — F419 Anxiety disorder, unspecified: Secondary | ICD-10-CM | POA: Diagnosis present

## 2020-01-05 DIAGNOSIS — I509 Heart failure, unspecified: Secondary | ICD-10-CM | POA: Diagnosis not present

## 2020-01-05 DIAGNOSIS — Z20822 Contact with and (suspected) exposure to covid-19: Secondary | ICD-10-CM | POA: Diagnosis present

## 2020-01-05 DIAGNOSIS — D631 Anemia in chronic kidney disease: Secondary | ICD-10-CM | POA: Diagnosis present

## 2020-01-05 DIAGNOSIS — I9752 Accidental puncture and laceration of a circulatory system organ or structure during other procedure: Secondary | ICD-10-CM | POA: Diagnosis not present

## 2020-01-05 LAB — CBC WITH DIFFERENTIAL/PLATELET
Abs Immature Granulocytes: 0.02 10*3/uL (ref 0.00–0.07)
Basophils Absolute: 0 10*3/uL (ref 0.0–0.1)
Basophils Relative: 1 %
Eosinophils Absolute: 0.2 10*3/uL (ref 0.0–0.5)
Eosinophils Relative: 3 %
HCT: 36.4 % (ref 36.0–46.0)
Hemoglobin: 11.3 g/dL — ABNORMAL LOW (ref 12.0–15.0)
Immature Granulocytes: 0 %
Lymphocytes Relative: 24 %
Lymphs Abs: 1.4 10*3/uL (ref 0.7–4.0)
MCH: 30.7 pg (ref 26.0–34.0)
MCHC: 31 g/dL (ref 30.0–36.0)
MCV: 98.9 fL (ref 80.0–100.0)
Monocytes Absolute: 0.7 10*3/uL (ref 0.1–1.0)
Monocytes Relative: 12 %
Neutro Abs: 3.5 10*3/uL (ref 1.7–7.7)
Neutrophils Relative %: 60 %
Platelets: 120 10*3/uL — ABNORMAL LOW (ref 150–400)
RBC: 3.68 MIL/uL — ABNORMAL LOW (ref 3.87–5.11)
RDW: 18 % — ABNORMAL HIGH (ref 11.5–15.5)
WBC: 5.9 10*3/uL (ref 4.0–10.5)
nRBC: 0 % (ref 0.0–0.2)

## 2020-01-05 LAB — LIPID PANEL
Cholesterol: 172 mg/dL (ref 0–200)
HDL: 71 mg/dL (ref >40)
Total CHOL/HDL Ratio: 2.4 RATIO
Triglycerides: 60 mg/dL (ref <150)
VLDL: 12 mg/dL (ref 0–40)

## 2020-01-05 LAB — CBG MONITORING, ED
Glucose-Capillary: 159 mg/dL — ABNORMAL HIGH (ref 70–99)
Glucose-Capillary: 173 mg/dL — ABNORMAL HIGH (ref 70–99)
Glucose-Capillary: 134 mg/dL — ABNORMAL HIGH (ref 70–99)

## 2020-01-05 LAB — HEMOGLOBIN A1C
Hgb A1c MFr Bld: 12.2 % — ABNORMAL HIGH (ref 4.8–5.6)
Mean Plasma Glucose: 303.44 mg/dL

## 2020-01-05 LAB — GLUCOSE, CAPILLARY: Glucose-Capillary: 124 mg/dL — ABNORMAL HIGH (ref 70–99)

## 2020-01-05 LAB — HEPARIN LEVEL (UNFRACTIONATED): Heparin Unfractionated: 0.75 IU/mL — ABNORMAL HIGH (ref 0.30–0.70)

## 2020-01-05 LAB — SARS CORONAVIRUS 2 (TAT 6-24 HRS): SARS Coronavirus 2: NEGATIVE

## 2020-01-05 LAB — MAGNESIUM: Magnesium: 1.5 mg/dL — ABNORMAL LOW (ref 1.7–2.4)

## 2020-01-05 LAB — URIC ACID: Uric Acid, Serum: 10.3 mg/dL — ABNORMAL HIGH (ref 2.5–7.1)

## 2020-01-05 MED ORDER — OXYCODONE-ACETAMINOPHEN 5-325 MG PO TABS
1.0000 | ORAL_TABLET | Freq: Four times a day (QID) | ORAL | Status: DC | PRN
  Administered 2020-01-06: 1 via ORAL
  Filled 2020-01-05: qty 1
  Filled 2020-01-05 (×3): qty 2

## 2020-01-05 MED ORDER — HEPARIN (PORCINE) 25000 UT/250ML-% IV SOLN
1000.0000 [IU]/h | INTRAVENOUS | Status: DC
  Administered 2020-01-05: 1100 [IU]/h via INTRAVENOUS
  Administered 2020-01-08: 1000 [IU]/h via INTRAVENOUS
  Filled 2020-01-05 (×3): qty 250

## 2020-01-05 MED ORDER — LACTATED RINGERS IV SOLN: INTRAVENOUS | Status: DC

## 2020-01-05 MED ORDER — HYDROMORPHONE HCL 1 MG/ML IJ SOLN
0.5000 mg | INTRAMUSCULAR | Status: DC | PRN
  Administered 2020-01-05 – 2020-01-09 (×10): 0.5 mg via INTRAVENOUS
  Filled 2020-01-05 (×11): qty 1

## 2020-01-05 MED ORDER — OXYCODONE-ACETAMINOPHEN 5-325 MG PO TABS: 1.0000 | ORAL_TABLET | ORAL | Status: DC | PRN

## 2020-01-05 MED ORDER — SODIUM CHLORIDE 0.9 % IV SOLN: INTRAVENOUS | Status: DC

## 2020-01-05 MED ORDER — PREDNISOLONE ACETATE 1 % OP SUSP
1.0000 [drp] | Freq: Two times a day (BID) | OPHTHALMIC | Status: DC
  Administered 2020-01-05 – 2020-01-09 (×9): 1 [drp] via OPHTHALMIC
  Filled 2020-01-05 (×2): qty 5

## 2020-01-05 MED ORDER — DORZOLAMIDE HCL-TIMOLOL MAL 2-0.5 % OP SOLN
1.0000 [drp] | Freq: Two times a day (BID) | OPHTHALMIC | Status: DC
  Administered 2020-01-05 – 2020-01-09 (×9): 1 [drp] via OPHTHALMIC
  Filled 2020-01-05 (×2): qty 10

## 2020-01-05 MED ORDER — ONDANSETRON HCL 4 MG PO TABS
4.0000 mg | ORAL_TABLET | Freq: Four times a day (QID) | ORAL | Status: DC | PRN
  Filled 2020-01-05: qty 1

## 2020-01-05 MED ORDER — LATANOPROST 0.005 % OP SOLN
1.0000 [drp] | Freq: Every day | OPHTHALMIC | Status: DC
  Administered 2020-01-05 – 2020-01-08 (×4): 1 [drp] via OPHTHALMIC
  Filled 2020-01-05 (×2): qty 2.5

## 2020-01-05 MED ORDER — VALACYCLOVIR HCL 500 MG PO TABS
1000.0000 mg | ORAL_TABLET | Freq: Two times a day (BID) | ORAL | Status: DC
  Administered 2020-01-05 – 2020-01-09 (×8): 1000 mg via ORAL
  Filled 2020-01-05 (×8): qty 2

## 2020-01-05 MED ORDER — POLYETHYLENE GLYCOL 3350 17 G PO PACK: 17.0000 g | PACK | Freq: Every day | ORAL | Status: DC | PRN

## 2020-01-05 MED ORDER — DOXAZOSIN MESYLATE 2 MG PO TABS
4.0000 mg | ORAL_TABLET | Freq: Every day | ORAL | Status: DC
  Administered 2020-01-05 – 2020-01-08 (×4): 4 mg via ORAL
  Filled 2020-01-05: qty 1
  Filled 2020-01-05 (×4): qty 2

## 2020-01-05 MED ORDER — ACETAMINOPHEN 650 MG RE SUPP: 650.0000 mg | Freq: Four times a day (QID) | RECTAL | Status: DC | PRN

## 2020-01-05 MED ORDER — INSULIN GLARGINE 100 UNIT/ML ~~LOC~~ SOLN
50.0000 [IU] | Freq: Every day | SUBCUTANEOUS | Status: DC
  Administered 2020-01-05 – 2020-01-06 (×2): 50 [IU] via SUBCUTANEOUS
  Filled 2020-01-05 (×3): qty 0.5

## 2020-01-05 MED ORDER — HEPARIN BOLUS VIA INFUSION
4000.0000 [IU] | Freq: Once | INTRAVENOUS | Status: AC
  Administered 2020-01-05: 13:00:00 4000 [IU] via INTRAVENOUS
  Filled 2020-01-05: qty 4000

## 2020-01-05 MED ORDER — ONDANSETRON HCL 4 MG/2ML IJ SOLN
4.0000 mg | Freq: Four times a day (QID) | INTRAMUSCULAR | Status: DC | PRN
  Administered 2020-01-06 – 2020-01-08 (×5): 4 mg via INTRAVENOUS
  Filled 2020-01-05 (×6): qty 2

## 2020-01-05 MED ORDER — ASPIRIN EC 81 MG PO TBEC
81.0000 mg | DELAYED_RELEASE_TABLET | Freq: Every day | ORAL | Status: DC
  Administered 2020-01-05 – 2020-01-09 (×4): 81 mg via ORAL
  Filled 2020-01-05 (×4): qty 1

## 2020-01-05 MED ORDER — ATORVASTATIN CALCIUM 40 MG PO TABS
40.0000 mg | ORAL_TABLET | Freq: Every day | ORAL | Status: DC
  Administered 2020-01-05 – 2020-01-08 (×4): 40 mg via ORAL
  Filled 2020-01-05 (×3): qty 1

## 2020-01-05 MED ORDER — ACETAMINOPHEN 325 MG PO TABS
650.0000 mg | ORAL_TABLET | Freq: Four times a day (QID) | ORAL | Status: DC | PRN
  Administered 2020-01-07: 650 mg via ORAL
  Filled 2020-01-05: qty 2

## 2020-01-05 MED ORDER — INSULIN ASPART 100 UNIT/ML ~~LOC~~ SOLN
0.0000 [IU] | Freq: Three times a day (TID) | SUBCUTANEOUS | Status: DC
  Administered 2020-01-05: 3 [IU] via SUBCUTANEOUS
  Administered 2020-01-06 (×2): 5 [IU] via SUBCUTANEOUS

## 2020-01-05 MED ORDER — ATORVASTATIN CALCIUM 40 MG PO TABS
40.0000 mg | ORAL_TABLET | Freq: Every day | ORAL | Status: DC
  Filled 2020-01-05 (×2): qty 1

## 2020-01-05 MED ORDER — INSULIN GLARGINE 100 UNITS/ML SOLOSTAR PEN
50.0000 [IU] | PEN_INJECTOR | Freq: Every day | SUBCUTANEOUS | Status: DC
  Filled 2020-01-05: qty 3

## 2020-01-05 MED ORDER — ALBUTEROL SULFATE (2.5 MG/3ML) 0.083% IN NEBU: 2.5000 mg | INHALATION_SOLUTION | RESPIRATORY_TRACT | Status: DC | PRN

## 2020-01-05 MED ORDER — MAGNESIUM SULFATE 2 GM/50ML IV SOLN
2.0000 g | Freq: Once | INTRAVENOUS | Status: AC
  Administered 2020-01-05: 2 g via INTRAVENOUS
  Filled 2020-01-05: qty 50

## 2020-01-05 MED ORDER — ATORVASTATIN CALCIUM 40 MG PO TABS
40.0000 mg | ORAL_TABLET | Freq: Once | ORAL | Status: AC
  Administered 2020-01-05: 02:00:00 40 mg via ORAL
  Filled 2020-01-05: qty 1

## 2020-01-05 MED ORDER — COLCHICINE 0.6 MG PO TABS
0.6000 mg | ORAL_TABLET | Freq: Every day | ORAL | Status: DC
  Administered 2020-01-05 – 2020-01-09 (×5): 0.6 mg via ORAL
  Filled 2020-01-05 (×5): qty 1

## 2020-01-05 MED ORDER — BRIMONIDINE TARTRATE 0.15 % OP SOLN
1.0000 [drp] | Freq: Two times a day (BID) | OPHTHALMIC | Status: DC
  Administered 2020-01-05 – 2020-01-09 (×9): 1 [drp] via OPHTHALMIC
  Filled 2020-01-05 (×2): qty 5

## 2020-01-05 MED ORDER — MORPHINE SULFATE (PF) 4 MG/ML IV SOLN
4.0000 mg | INTRAVENOUS | Status: DC | PRN
  Administered 2020-01-05 (×2): 4 mg via INTRAVENOUS
  Filled 2020-01-05 (×2): qty 1

## 2020-01-05 NOTE — Progress Notes (Signed)
PROGRESS NOTE  [name] [id number] DOB: 1956/02/25 DOA: 01/04/2020 PCP: Javier Docker, MD  HPI/Recap of past 24 hours: HPI from Dr Cyd Silence 64 year old female with past medical history of chronic low back pain, chronic diastolic CHF, DM type 2, gout, HLD, HTN, OSA and obesity who presents with complaints of left foot pain. Patient explains that approximately 1 month ago she began to develop left foot pain. Patient's pain continued to worsen to the point where she presented to a local urgent care clinic twice several weeks ago and was managed for gout with prednisone twice. Patient then presented to her PCP approximately 1 week ago where she was placed on a course of colchicine 0.6 mg twice daily and told to take it for 14 days.  Despite taking this, her pain continued to worsen. Patient states that her pain got to the point where she has been unable to ambulate around her home even when using an assistive device such as a walker. Upon evaluation in the ED, patient was found to have severe pain in the left foot, as well as cool to the touch with diminished pulses concerning for subacute limb ischemia.  Case was discussed with Dr. Carlis Abbott with vascular surgery who recommended obtaining vascular arterial duplex as well as an ABI.  Patient admitted for further management.    Today, pt still reporting L foot pain, cold toes especially the 1st three toes, denies any chest pain, abdominal pain, nausea/vomiting, fever/chills.    Assessment/Plan: Active Problems:   Uncontrolled type 2 diabetes mellitus with stage 3 chronic kidney disease, with long-term current use of insulin (HCC)   Essential hypertension   Chronic diastolic heart failure (HCC)   CKD (chronic kidney disease) stage 3, GFR 30-59 ml/min   Thrombocytopenia (HCC)   Acute foot pain, left   ?? Subacute limb ischemia of LLE Peripheral vascular disease History as above ABI on the right 0.91, on the left is 0.44 Lower  extremity arterial duplex showed possible occlusion in the proximal popliteal artery LDL not calculated, total cholesterol 172 Lactic acid 1 Vascular surgery consulted, appreciate recs Start statins, aspirin Start IV Heparin drip for now pending vascular surgery recommendations  Diabetes mellitus type 2 Uncontrolled A1c 12.2 SSI, Lantus, Accu-Cheks, hypoglycemic protocol  CKD stage III No baseline to compare as last creatinine was in 2019, although appears to be around baseline Gentle hydration Strict I's and O's Continue to hold diuretic for now Daily BMP  Hypomagnesemia Replace as needed  Thrombocytopenia Unknown etiology Daily CBC  History of gout Uric acid 10.3 Continue colchicine  Chronic diastolic HF Appears euvolemic Echo done in 2018 showed EF of 60 to 65%, no regional wall motion abnormality, grade 2 diastolic dysfunction Strict I's and O's, daily weights Hold home diuretics for now  Hypertension Hold home lisinopril  Morbid obesity Lifestyle modification advised       Malnutrition Type:      Malnutrition Characteristics:      Nutrition Interventions:       Estimated body mass index is 42.7 kg/m as calculated from the following:   Height as of this encounter: 5\' 1"  (1.549 m).   Weight as of this encounter: 102.5 kg.     Code Status: Full  Family Communication: Discussed extensively with patient  Disposition Plan: Patient came from home, plan to DC to home, pending work-up completion, consultant signing off.   Consultants:  Vascular surgery  Procedures:  None  Antimicrobials:  None  DVT prophylaxis: Heparin  Objective: Vitals:   01/05/20 0530 01/05/20 0545 01/05/20 0600 01/05/20 0630  BP: 132/64  136/69 (!) 128/56  Pulse: 87 80 90 83  Resp: 14 17 14 17   Temp:      TempSrc:      SpO2: 97% 98% 99% 100%  Weight:      Height:       No intake or output data in the 24 hours ending 01/05/20 1148 Filed Weights    01/04/20 2128  Weight: 102.5 kg    Exam:  General: NAD, pleasant  Cardiovascular: S1, S2 present  Respiratory: CTAB  Abdomen: Soft, nontender, nondistended, bowel sounds present  Musculoskeletal: Trace bilateral pedal edema noted, markedly diminished pulses in the left foot, which is also cool to touch  Skin: Normal  Psychiatry: Normal mood   Data Reviewed: CBC: Recent Labs  Lab 01/04/20 2227 01/05/20 1102  WBC 5.8 5.9  NEUTROABS 3.0 3.5  HGB 12.3 11.3*  HCT 38.3 36.4  MCV 95.3 98.9  PLT 131* 123456*   Basic Metabolic Panel: Recent Labs  Lab 01/04/20 2227  NA 137  K 4.4  CL 108  CO2 21*  GLUCOSE 189*  BUN 47*  CREATININE 2.02*  CALCIUM 8.9   GFR: Estimated Creatinine Clearance: 31.4 mL/min (A) (by C-G formula based on SCr of 2.02 mg/dL (H)). Liver Function Tests: Recent Labs  Lab 01/04/20 2227  AST 29  ALT 36  ALKPHOS 58  BILITOT 0.5  PROT 6.7  ALBUMIN 3.5   No results for input(s): LIPASE, AMYLASE in the last 168 hours. No results for input(s): AMMONIA in the last 168 hours. Coagulation Profile: No results for input(s): INR, PROTIME in the last 168 hours. Cardiac Enzymes: No results for input(s): CKTOTAL, CKMB, CKMBINDEX, TROPONINI in the last 168 hours. BNP (last 3 results) No results for input(s): PROBNP in the last 8760 hours. HbA1C: No results for input(s): HGBA1C in the last 72 hours. CBG: Recent Labs  Lab 01/04/20 2217 01/05/20 0833  GLUCAP 184* 159*   Lipid Profile: No results for input(s): CHOL, HDL, LDLCALC, TRIG, CHOLHDL, LDLDIRECT in the last 72 hours. Thyroid Function Tests: No results for input(s): TSH, T4TOTAL, FREET4, T3FREE, THYROIDAB in the last 72 hours. Anemia Panel: No results for input(s): VITAMINB12, FOLATE, FERRITIN, TIBC, IRON, RETICCTPCT in the last 72 hours. Urine analysis:    Component Value Date/Time   COLORURINE YELLOW 11/30/2016 1829   APPEARANCEUR CLEAR 11/30/2016 1829   LABSPEC 1.022 11/30/2016 1829     PHURINE 6.0 11/30/2016 1829   GLUCOSEU >=500 (A) 11/30/2016 1829   HGBUR NEGATIVE 11/30/2016 1829   HGBUR negative 08/20/2009 0807   BILIRUBINUR NEGATIVE 11/30/2016 1829   BILIRUBINUR neg 05/14/2013 1111   KETONESUR NEGATIVE 11/30/2016 1829   PROTEINUR NEGATIVE 11/30/2016 1829   UROBILINOGEN 1.0 11/11/2014 1215   NITRITE NEGATIVE 11/30/2016 1829   LEUKOCYTESUR TRACE (A) 11/30/2016 1829   Sepsis Labs: @LABRCNTIP (procalcitonin:4,lacticidven:4)  ) Recent Results (from the past 240 hour(s))  Blood culture (routine x 2)     Status: None (Preliminary result)   Collection Time: 01/04/20 10:30 PM   Specimen: BLOOD  Result Value Ref Range Status   Specimen Description BLOOD LEFT ANTECUBITAL  Final   Special Requests   Final    BOTTLES DRAWN AEROBIC AND ANAEROBIC Blood Culture adequate volume   Culture   Final    NO GROWTH < 12 HOURS Performed at [facility], [place] [address], [place], [place] [address]    Report Status PENDING  Incomplete  Blood culture (routine x 2)     Status: None (Preliminary result)   Collection Time: 01/04/20 10:30 PM   Specimen: BLOOD  Result Value Ref Range Status   Specimen Description BLOOD RIGHT ANTECUBITAL  Final   Special Requests   Final    BOTTLES DRAWN AEROBIC AND ANAEROBIC Blood Culture results may not be optimal due to an excessive volume of blood received in culture bottles   Culture   Final    NO GROWTH < 12 HOURS Performed at [facility], [place] [address] [place], [place] [address]    Report Status PENDING  Incomplete  SARS CORONAVIRUS 2 (TAT 6-24 HRS) Nasopharyngeal Nasopharyngeal Swab     Status: None   Collection Time: 01/05/20 12:57 AM   Specimen: Nasopharyngeal Swab  Result Value Ref Range Status   SARS Coronavirus 2 NEGATIVE NEGATIVE Final    Comment: (NOTE) SARS-CoV-2 target nucleic acids are NOT DETECTED. The SARS-CoV-2 RNA is generally detectable in upper and lower respiratory specimens during the acute phase of  infection. Negative results do not preclude SARS-CoV-2 infection, do not rule out co-infections with other pathogens, and should not be used as the sole basis for treatment or other patient management decisions. Negative results must be combined with clinical observations, patient history, and epidemiological information. The expected result is Negative. Fact Sheet for Patients: SugarRoll.be Fact Sheet for Healthcare Providers: https://www.woods-mathews.com/ This test is not yet approved or cleared by the [place] FDA and  has been authorized for detection and/or diagnosis of SARS-CoV-2 by FDA under an Emergency Use Authorization (EUA). This EUA will remain  in effect (meaning this test can be used) for the duration of the COVID-19 declaration under Section 56 4(b)(1) of the Act, 21 U.S.C. section 360bbb-3(b)(1), unless the authorization is terminated or revoked sooner. Performed at [facility], [place] [address], [place], [place] [address]       Studies: DG Foot Complete Left  Result Date: 01/05/2020 CLINICAL DATA:  Swelling EXAM: LEFT FOOT - COMPLETE 3+ VIEW COMPARISON:  None. FINDINGS: Degenerative changes in the 1st MTP joint. No acute bony abnormality. Specifically, no fracture, subluxation, or dislocation. Vascular calcifications noted. No bone destruction seen to suggest osteomyelitis. IMPRESSION: No acute bony abnormality. Electronically Signed   By: Rolm Baptise M.D.   On: 01/05/2020 01:25   VAS [place] ABI WITH/WO TBI  Result Date: 01/05/2020 LOWER EXTREMITY DOPPLER STUDY Indications: Blue, cold toes. High Risk Factors: Hypertension, Diabetes.  Comparison Study: No prior studies. Performing Technologist: Carlos Levering Rvt  Examination Guidelines: A complete evaluation includes at minimum, Doppler waveform signals and systolic blood pressure reading at the level of bilateral brachial, anterior tibial, and posterior tibial arteries,  when vessel segments are accessible. Bilateral testing is considered an integral part of a complete examination. Photoelectric Plethysmograph (PPG) waveforms and toe systolic pressure readings are included as required and additional duplex testing as needed. Limited examinations for reoccurring indications may be performed as noted.  ABI Findings: +---------+------------------+-----+---------+--------+ Right    Rt Pressure (mmHg)IndexWaveform Comment  +---------+------------------+-----+---------+--------+ Brachial 150                    triphasic         +---------+------------------+-----+---------+--------+ PTA      108               0.72 biphasic          +---------+------------------+-----+---------+--------+ DP       137  0.91 triphasic         +---------+------------------+-----+---------+--------+ Great Toe76                0.51                   +---------+------------------+-----+---------+--------+ +---------+------------------+-----+----------+-------+ Left     Lt Pressure (mmHg)IndexWaveform  Comment +---------+------------------+-----+----------+-------+ Brachial 136                    triphasic         +---------+------------------+-----+----------+-------+ PTA      66                0.44 monophasic        +---------+------------------+-----+----------+-------+ DP       53                0.35 monophasic        +---------+------------------+-----+----------+-------+ Great Toe0                 0.00                   +---------+------------------+-----+----------+-------+ +-------+-----------+-----------+------------+------------+ ABI/TBIToday's ABIToday's TBIPrevious ABIPrevious TBI +-------+-----------+-----------+------------+------------+ Right  0.91       0.51                                +-------+-----------+-----------+------------+------------+ Left   0.44       0                                    +-------+-----------+-----------+------------+------------+  Summary: Right: Resting right ankle-brachial index indicates mild right lower extremity arterial disease. The right toe-brachial index is abnormal. Left: Resting left ankle-brachial index indicates severe left lower extremity arterial disease. The left toe-brachial index is abnormal.  *See table(s) above for measurements and observations.     Preliminary    VAS [place] LOWER EXTREMITY ARTERIAL DUPLEX  Result Date: 01/05/2020 LOWER EXTREMITY ARTERIAL DUPLEX STUDY Indications: Blue, cold toes. High Risk Factors: Hypertension, Diabetes.  Current ABI: R 0.91 L 0.44 Limitations: Patient body habitus, poor ultrasound/tissue interface. Comparison Study: No prior studies. Performing Technologist: Oliver Hum RVT  Examination Guidelines: A complete evaluation includes B-mode imaging, spectral Doppler, color Doppler, and power Doppler as needed of all accessible portions of each vessel. Bilateral testing is considered an integral part of a complete examination. Limited examinations for reoccurring indications may be performed as noted.  +-----------+--------+-----+--------+----------+------------------+ LEFT       PSV cm/sRatioStenosisWaveform  Comments           +-----------+--------+-----+--------+----------+------------------+ EIA Distal 116                  biphasic                     +-----------+--------+-----+--------+----------+------------------+ CFA Mid    132                  biphasic                     +-----------+--------+-----+--------+----------+------------------+ DFA        111                  biphasic                     +-----------+--------+-----+--------+----------+------------------+ SFA Prox  104                  biphasic                     +-----------+--------+-----+--------+----------+------------------+ SFA Mid    85                   biphasic                      +-----------+--------+-----+--------+----------+------------------+ SFA Distal 91                   biphasic                     +-----------+--------+-----+--------+----------+------------------+ POP Prox   26                   monophasic                   +-----------+--------+-----+--------+----------+------------------+ POP Distal 0                              Unable to insonate +-----------+--------+-----+--------+----------+------------------+ ATA Prox   73                   biphasic                     +-----------+--------+-----+--------+----------+------------------+ ATA Mid    33                   monophasic                   +-----------+--------+-----+--------+----------+------------------+ ATA Distal 32                   monophasic                   +-----------+--------+-----+--------+----------+------------------+ PTA Prox                                  Unable to insonate +-----------+--------+-----+--------+----------+------------------+ PTA Mid    27                   monophasic                   +-----------+--------+-----+--------+----------+------------------+ PTA Distal 9                    monophasic                   +-----------+--------+-----+--------+----------+------------------+ PERO Prox                                 Unable to insonate +-----------+--------+-----+--------+----------+------------------+ PERO Mid                                  Unable to insonate +-----------+--------+-----+--------+----------+------------------+ PERO Distal15                   monophasic                   +-----------+--------+-----+--------+----------+------------------+ DP         29  monophasic                   +-----------+--------+-----+--------+----------+------------------+  Summary: Left: The external iliac, common femoral, and superficial femoral arteries appear patent with no  significant stenosis. Monophasic waveforms are noted in the proximal popliteal artery with poor visualization of the distal segment. Which suggests a possible occlusion. Anterior tibial artery appears patent throughout with monophasic flow. The posterior tibial, and peroneal arteries demonstrated monophasic flow with likely areas of occlusive disease. The proximal calf arteries were difficult to visualize.  See table(s) above for measurements and observations.    Preliminary     Scheduled Meds: . aspirin EC  81 mg Oral Q breakfast  . atorvastatin  40 mg Oral Daily  . brimonidine  1 drop Both Eyes BID  . colchicine  0.6 mg Oral Daily  . dorzolamide-timolol  1 drop Both Eyes BID  . doxazosin  4 mg Oral QHS  . insulin aspart  0-15 Units Subcutaneous TID AC & HS  . insulin glargine  50 Units Subcutaneous Q breakfast  . latanoprost  1 drop Both Eyes QHS  . prednisoLONE acetate  1 drop Right Eye BID    Continuous Infusions: . lactated ringers 100 mL/hr at 01/05/20 0848     LOS: 0 days     Alma Friendly, MD [facility]  If 7PM-7AM, please contact night-coverage [email or web address] 01/05/2020, 11:48 AM

## 2020-01-05 NOTE — Progress Notes (Signed)
Pharmacy requested to resume Valtrex per consult for R-eye infection.  Home dose Valtrex 1g po TID.   Currently,  SCr elevated at 2.02 with estimated CrCl ~31 mL/min.   Plan: Adjust dose to 1g po BID due to current renal function.  Monitor and adjust dose if improves.   Sloan Leiter, PharmD, BCPS, BCCCP Clinical Pharmacist Please refer to [facility] for [place] numbers 01/05/2020, 11:44 PM

## 2020-01-05 NOTE — ED Notes (Signed)
Check patient cbg it was 173 notified RN of blood sugar

## 2020-01-05 NOTE — ED Notes (Signed)
Breakfast ordered 

## 2020-01-05 NOTE — ED Notes (Signed)
Checked patient cbg it was 83 notified RN of blood sugar patient is resting with call bell in reach

## 2020-01-05 NOTE — ED Notes (Signed)
Lunch Tray Ordered @ 1737. 

## 2020-01-05 NOTE — ED Notes (Signed)
Lunch Tray Ordered @ 1111. 

## 2020-01-05 NOTE — Progress Notes (Signed)
ABI's and left lower extremity arterial duplex have been completed. Preliminary results can be found in CV Proc through chart review.   01/05/20 9:57 AM [name] RVT

## 2020-01-05 NOTE — Progress Notes (Signed)
ANTICOAGULATION CONSULT NOTE - Initial Consult  Pharmacy Consult for heparin Indication: R/o ischemic left foot   Allergies  Allergen Reactions  . Pork Allergy   . Pork-Derived Products Nausea And Vomiting  . Whey Nausea And Vomiting    Just not in big amounts     Patient Measurements: Height: 5\' 1"  (154.9 cm) Weight: 102.5 kg (226 lb) IBW/kg (Calculated) : 47.8 Heparin Dosing Weight: 72 kg   Vital Signs: BP: 119/54 (04/19 1148) Pulse Rate: 68 (04/19 1148)  Labs: Recent Labs    01/04/20 2227 01/05/20 1102  HGB 12.3 11.3*  HCT 38.3 36.4  PLT 131* 120*  CREATININE 2.02*  --     Estimated Creatinine Clearance: 31.4 mL/min (A) (by C-G formula based on SCr of 2.02 mg/dL (H)).   Medical History: Past Medical History:  Diagnosis Date  . Achilles rupture   . Anxiety   . Arthritis   . Asthma   . Chronic back pain   . Chronic diastolic heart failure ([place]) 11/30/2016  . Chronic headaches   . Chronic leg pain    bilaterally  . CHRONIC OBSTRUCTIVE PULMONARY DISEASE, MODERATE 03/26/2010   Annotation: with moderate restrictive lung disease as well per PFTs  04/02/07--actual numbers not noted in old records Qualifier: Diagnosis of  By: Amil Amen MD, Benjamine Mola    . Depression   . Diabetes mellitus 1999   T2DM  . Glaucoma   . Gout   . H/O gestational diabetes mellitus, not currently pregnant   . Hyperlipidemia   . Hypertension   . Morbid obesity ([place])   . Obstructive sleep apnea on CPAP 07/02/2009   Qualifier: Diagnosis of  By: Amil Amen MD, Benjamine Mola    . OSA (obstructive sleep apnea)   . Sciatica     Medications:  (Not in a hospital admission)   Assessment: 73 YOF with severe left foot pain concerning for subacute limb ischemia. Pharmacy consulted to start patient on IV heparin. H/H down, Plt low  Of note patient has a documented allergy to pork-derived products but she seems to have tolerated heparin and Lovenox injections recently  Goal of Therapy:  Heparin  level 0.3-0.7 units/ml Monitor platelets by anticoagulation protocol: Yes   Plan:  -Heparin 4000 units IV bolus then start IV heparin at 1100 units/hr -F/u 6 hr HL -Monitor daily HL, CBC and s/s of bleeding   Albertina Parr, PharmD., BCPS, BCCCP Clinical Pharmacist Clinical phone for 01/05/20 until 3:30pm: [phone] If after 3:30pm, please refer to [facility] for unit-specific pharmacist

## 2020-01-05 NOTE — ED Notes (Signed)
Pt transported to US via stretcher at this time.  

## 2020-01-05 NOTE — Progress Notes (Signed)
ANTICOAGULATION CONSULT NOTE - Follow Up Consult  Pharmacy Consult for Heparin Indication: R/o ischemic left foot   Allergies  Allergen Reactions  . Pork Allergy   . Pork-Derived Products Nausea And Vomiting  . Whey Nausea And Vomiting    Just not in big amounts     Patient Measurements: Height: 5\' 1"  (154.9 cm) Weight: 102.5 kg (226 lb) IBW/kg (Calculated) : 47.8 Heparin Dosing Weight: 72.6 kg   Vital Signs: BP: 112/59 (04/19 1600) Pulse Rate: 59 (04/19 1600)  Labs: Recent Labs    01/04/20 2227 01/05/20 1102 01/05/20 2026  HGB 12.3 11.3*  --   HCT 38.3 36.4  --   PLT 131* 120*  --   HEPARINUNFRC  --   --  0.75*  CREATININE 2.02*  --   --     Estimated Creatinine Clearance: 31.4 mL/min (A) (by C-G formula based on SCr of 2.02 mg/dL (H)).   Medical History: Past Medical History:  Diagnosis Date  . Achilles rupture   . Anxiety   . Arthritis   . Asthma   . Chronic back pain   . Chronic diastolic heart failure ([place]) 11/30/2016  . Chronic headaches   . Chronic leg pain    bilaterally  . CHRONIC OBSTRUCTIVE PULMONARY DISEASE, MODERATE 03/26/2010   Annotation: with moderate restrictive lung disease as well per PFTs  04/02/07--actual numbers not noted in old records Qualifier: Diagnosis of  By: Amil Amen MD, Benjamine Mola    . Depression   . Diabetes mellitus 1999   T2DM  . Glaucoma   . Gout   . H/O gestational diabetes mellitus, not currently pregnant   . Hyperlipidemia   . Hypertension   . Morbid obesity ([place])   . Obstructive sleep apnea on CPAP 07/02/2009   Qualifier: Diagnosis of  By: Amil Amen MD, Benjamine Mola    . OSA (obstructive sleep apnea)   . Sciatica     [name] with severe left foot pain concerning for subacute limb ischemia. Pharmacy consulted to start patient on IV heparin. Pt was on no anticoagulants PTA, per med rec.  Of note, patient has a documented allergy (N/V) to pork-derived product, but she seems to have tolerated heparin  and Lovenox injections recently.  Initial heparin level (~7.5 hrs after heparin bolus of 4000 units IV, followed by heparin infusion at 1100 units/hr) was 0.75 units/ml, which is above the goal range for this pt. H/H 11.3/36.4, platelets 120 (all trending down). Per RN, no issues with IV or bleeding observed.  Goal of Therapy:  Heparin level 0.3-0.7 units/ml Monitor platelets by anticoagulation protocol: Yes   Plan:  Decrease heparin infusion to 1000 units/hr Check 7-hr heparin level Monitor daily heparin level, CBC Monitor for signs/symptoms of bleeding F/U Vascular recommendations  Gillermina Hu, PharmD, BCPS, [facility] Clinical Pharmacist 01/05/20, 21:00 PM

## 2020-01-05 NOTE — H&P (Addendum)
History and Physical    [name] [id number] DOB: Jul 27, 1956 DOA: 01/04/2020  PCP: Javier Docker, MD  Patient coming from: Home   Chief Complaint:  Chief Complaint  Patient presents with  . Gout     HPI:   64 year old female with past medical history of chronic low back pain, chronic diastolic congestive heart failure, diabetes mellitus type 2, gout, hyperlipidemia, hypertension, obstructive sleep apnea and obesity who presents to [facility] emergency department with complaints of left foot pain.  Patient explains that approximately 1 month ago she began to develop left foot pain.  Patient describes this pain as sharp and tingling in quality, severe in intensity and worse with weightbearing.  Pain radiates proximally.  Patient is unable to identify any alleviating factors.  Patient denies any associated redness or swelling of the affected extremity.  Patient denies any fevers.  Patient denies any associated weakness, change in appetite or sick contacts.  Patient's pain continued to worsen to the point where she presented to a local urgent care clinic several weeks ago.  Thinking that it was gout, the urgent care provider provided her with a course of prednisone.  After completing this course with no symptomatic improvement she returned to the urgent care clinic where they placed her on yet another course of prednisone, again with no symptomatic improvement after completion of the second course.  Patient then presented to her primary care provider approximately 1 week ago where she was placed on a course of colchicine 0.6 mg twice daily and told to take it for 14 days.  Despite taking this third course of therapy she again experienced no improvement in symptoms and in fact her pain continued to worsen.  Patient states that her pain got to the point where she has been unable to ambulate around her home even when using an assistive device such as a walker.  With the  patient's worsening symptoms she eventually presented to [facility] emergency department for evaluation.  Upon evaluation in the emergency department patient was found to have severe pain of the left foot with the foot found to be cool to the touch with diminished pulses concerning for subacute limb ischemia.  Case was discussed with Dr. Carlis Abbott with vascular surgery who recommended obtaining vascular arterial duplex as well as an ABI.  He stated that they would be happy to consult on the patient in the morning.  Hospitalist group was then called to assess the patient for admission to the hospital.     Review of Systems: A 10-system review of systems has been performed and all systems are negative with the exception of what is listed in the HPI.   Past Medical History:  Diagnosis Date  . Achilles rupture   . Anxiety   . Arthritis   . Asthma   . Chronic back pain   . Chronic diastolic heart failure ([place]) 11/30/2016  . Chronic headaches   . Chronic leg pain    bilaterally  . CHRONIC OBSTRUCTIVE PULMONARY DISEASE, MODERATE 03/26/2010   Annotation: with moderate restrictive lung disease as well per PFTs  04/02/07--actual numbers not noted in old records Qualifier: Diagnosis of  By: Amil Amen MD, Benjamine Mola    . Depression   . Diabetes mellitus 1999   T2DM  . Glaucoma   . Gout   . H/O gestational diabetes mellitus, not currently pregnant   . Hyperlipidemia   . Hypertension   . Morbid obesity ([place])   . Obstructive sleep apnea  on CPAP 07/02/2009   Qualifier: Diagnosis of  By: Amil Amen MD, Benjamine Mola    . OSA (obstructive sleep apnea)   . Sciatica     Past Surgical History:  Procedure Laterality Date  . BACK SURGERY    . BREAST EXCISIONAL BIOPSY Right   . CERVICAL SPINE SURGERY    . CESAREAN SECTION  12/10/89  . CHOLECYSTECTOMY  1991  . LUMBAR SPINE SURGERY    . RIGHT HEART CATH N/A 12/06/2016   Procedure: Right Heart Cath;  Surgeon: Larey Dresser, MD;  Location: [facility];  Service: Cardiovascular;  Laterality: N/A;  . TUBAL LIGATION       reports that she quit smoking about 21 years ago. Her smoking use included cigarettes. She has never used smokeless tobacco. She reports current alcohol use. She reports that she does not use drugs.  Allergies  Allergen Reactions  . Pork Allergy   . Pork-Derived Products Nausea And Vomiting  . Whey Nausea And Vomiting    Just not in big amounts     Family History  Problem Relation Age of Onset  . Heart failure Mother   . Renal Disease Mother      Prior to Admission medications   Medication Sig Start Date End Date Taking? Authorizing Provider  acetaminophen (TYLENOL) 500 MG tablet Take 500-1,000 mg by mouth every 6 (six) hours as needed for mild pain or headache.    Yes [provider]  albuterol (PROVENTIL HFA;VENTOLIN HFA) 108 (90 BASE) MCG/ACT inhaler Inhale 2 puffs into the lungs every 6 (six) hours as needed for wheezing or shortness of breath.    Yes Robyn Haber, MD  ALPHAGAN P 0.15 % ophthalmic solution Place 1 drop into both eyes 2 (two) times daily.  07/03/19  Yes [provider]  aspirin EC 81 MG tablet Take 81 mg by mouth daily with breakfast.   Yes [provider]  colchicine 0.6 MG tablet Take 0.6 mg by mouth daily.   Yes [provider]  dorzolamide-timolol (COSOPT) 22.3-6.8 MG/ML ophthalmic solution Place 1 drop into both eyes 2 (two) times daily.   Yes [provider]  doxazosin (CARDURA) 4 MG tablet Take 4 mg by mouth at bedtime.   Yes [provider]  HYDROcodone-acetaminophen (NORCO/VICODIN) 5-325 MG tablet Take 1 tablet by mouth every 6 (six) hours as needed for moderate pain. 04/24/18  Yes Lawyer, Harrell Gave, PA-C  insulin aspart (NOVOLOG FLEXPEN) 100 UNIT/ML FlexPen Inject 3-20 Units into the skin 3 (three) times daily as needed for high blood sugar (CBG 150).   Yes [provider]  insulin glargine (LANTUS) 100 unit/mL SOPN  Inject 50 Units into the skin daily before breakfast.   Yes [provider]  liraglutide (VICTOZA) 18 MG/3ML SOPN Inject 1.8 mg into the skin daily.    Yes [provider]  lisinopril (PRINIVIL,ZESTRIL) 5 MG tablet Take 5 mg by mouth daily with breakfast.  06/18/17  Yes [provider]  prednisoLONE acetate (PRED FORTE) 1 % ophthalmic suspension Place 1 drop into the right eye in the morning and at bedtime. 01/01/20  Yes [provider]  torsemide (DEMADEX) 20 MG tablet Take 3 tablets (60 mg total) by mouth as directed. Take 60 mg in the AM. You may take 60 mg in the PM as needed for swelling Patient taking differently: Take 40 mg by mouth daily.  12/04/17  Yes Josue Hector, MD  Travoprost, BAK Free, (TRAVATAN) 0.004 % SOLN ophthalmic  solution Place 1 drop into both eyes at bedtime.   Yes [provider]  valACYclovir (VALTREX) 1000 MG tablet Take 1,000 mg by mouth 3 (three) times daily. 09/29/19  Yes [provider]  atorvastatin (LIPITOR) 10 MG tablet Take 1 tablet (10 mg total) by mouth daily. 09/13/17 01/23/18  Adora Fridge, RPH-CPP  leuprolide (LUPRON DEPOT, 60-MONTH,) 11.25 MG injection Inject 11.25 mg into the muscle every 3 (three) months. Patient not taking: Reported on 03/25/2019 05/08/18   Shelly Bombard, MD  metolazone (ZAROXOLYN) 2.5 MG tablet Take 1 tablet (2.5 mg total) by mouth as directed. Must call CHF clinic before taking! [phone]). Patient not taking: Reported on 01/05/2020 10/09/17 01/04/29  Shirley Friar, PA-C  predniSONE (STERAPRED UNI-PAK 21 TAB) 10 MG (21) TBPK tablet 6 tabs for 1 day, then 5 tabs for 1 das, then 4 tabs for 1 day, then 3 tabs for 1 day, 2 tabs for 1 day, then 1 tab for 1 day Patient not taking: Reported on 01/05/2020 12/04/19   Orvan July, NP  PRESCRIPTION MEDICATION Inhale into the lungs at bedtime. CPAP    [provider]  torsemide (DEMADEX) 20 MG tablet Take 4 tablets (80 mg  total) by mouth daily. Patient not taking: Reported on 03/04/2018 10/22/17   Shirley Friar, PA-C  Vitamin D, Ergocalciferol, (DRISDOL) 50000 units CAPS capsule Take 1 capsule (50,000 Units total) by mouth every 7 (seven) days. Patient not taking: Reported on 01/05/2020 09/13/17   Adora Fridge, RPH-CPP    Physical Exam: Vitals:   01/05/20 0000 01/05/20 0015 01/05/20 0030 01/05/20 0045  BP: (!) 143/71  (!) 147/60   Pulse: 81 81 81 87  Resp: 17 14 19 14   Temp:      TempSrc:      SpO2: 94% 98% 98% 99%  Weight:      Height:        Constitutional: Acute alert and oriented x3, patient is in distress due to pain. Skin: no rashes, no lesions, good skin turgor noted. Eyes: Pupils are equally reactive to light.  No evidence of scleral icterus or conjunctival pallor.  ENMT: Mucous membranes are moist. Posterior pharynx clear of any exudate or lesions. Normal dentition.   Neck: normal, supple, no masses, no thyromegaly Respiratory: clear to auscultation bilaterally, no wheezing, no crackles. Normal respiratory effort. No accessory muscle use.  Cardiovascular: Regular rate and rhythm, no murmurs / rubs / gallops. No extremity edema.  Markedly diminished pulses of the left foot.  No carotid bruits.  Back:   Nontender without crepitus or deformity. Abdomen: Very mild generalized tenderness.  Abdomen is soft however.  No evidence of intra-abdominal masses.  Positive bowel sounds noted in all quadrants.   Musculoskeletal: Left foot is notably cool to the touch with tenderness of the affected areas.  Diminished pulses as mentioned above of the left foot.  Otherwise, other extremities reveal no deformities, tenderness, edema with good muscle tone.    Neurologic: CN 2-12 grossly intact. Sensation intact, patient is able to move all 4 extremities spontaneously.  Patient is following all commands.  Patient is responsive to verbal stimuli.   Psychiatric: Patient presents as a normal mood with  appropriate affect.  Patient seems to possess insight as to theircurrent situation.     Labs on Admission: I have personally reviewed following labs and imaging studies -   CBC: Recent Labs  Lab 01/04/20 2227  WBC 5.8  NEUTROABS 3.0  HGB 12.3  HCT 38.3  MCV 95.3  PLT [id number]*   Basic Metabolic Panel: Recent Labs  Lab 01/04/20 2227  NA 137  K 4.4  CL 108  CO2 21*  GLUCOSE 189*  BUN 47*  CREATININE 2.02*  CALCIUM 8.9   GFR: Estimated Creatinine Clearance: 31.4 mL/min (A) (by C-G formula based on SCr of 2.02 mg/dL (H)). Liver Function Tests: Recent Labs  Lab 01/04/20 2227  AST 29  ALT 36  ALKPHOS 58  BILITOT 0.5  PROT 6.7  ALBUMIN 3.5   No results for input(s): LIPASE, AMYLASE in the last 168 hours. No results for input(s): AMMONIA in the last 168 hours. Coagulation Profile: No results for input(s): INR, PROTIME in the last 168 hours. Cardiac Enzymes: No results for input(s): CKTOTAL, CKMB, CKMBINDEX, TROPONINI in the last 168 hours. BNP (last 3 results) No results for input(s): PROBNP in the last 8760 hours. HbA1C: No results for input(s): HGBA1C in the last 72 hours. CBG: Recent Labs  Lab 01/04/20 2217  GLUCAP 184*   Lipid Profile: No results for input(s): CHOL, HDL, LDLCALC, TRIG, CHOLHDL, LDLDIRECT in the last 72 hours. Thyroid Function Tests: No results for input(s): TSH, T4TOTAL, FREET4, T3FREE, THYROIDAB in the last 72 hours. Anemia Panel: No results for input(s): VITAMINB12, FOLATE, FERRITIN, TIBC, IRON, RETICCTPCT in the last 72 hours. Urine analysis:    Component Value Date/Time   COLORURINE YELLOW 11/30/2016 1829   APPEARANCEUR CLEAR 11/30/2016 1829   LABSPEC 1.022 11/30/2016 1829   PHURINE 6.0 11/30/2016 1829   GLUCOSEU >=500 (A) 11/30/2016 1829   HGBUR NEGATIVE 11/30/2016 1829   HGBUR negative 08/20/2009 [place] 11/30/2016 1829   BILIRUBINUR neg 05/14/2013 1111   KETONESUR NEGATIVE 11/30/2016 1829   PROTEINUR  NEGATIVE 11/30/2016 1829   UROBILINOGEN 1.0 11/11/2014 1215   NITRITE NEGATIVE 11/30/2016 1829   LEUKOCYTESUR TRACE (A) 11/30/2016 1829    Radiological Exams on Admission:  Left foot x-ray pending.   Assessment/Plan Active Problems:   Acute foot pain, left   Patient presenting with exquisite tenderness and pain of the left foot, progressively worsening over the past month.  On examination foot is somewhat cool to touch with diminished pulses more concerning for a subacute limb ischemia.  Patient states she is unable to go back home due to severe pain and inability to ambulate -  therefore patient will be placed in observation for gentle hydration, pain control and expert consultation.  Continue home regimen of aspirin  Initiating statin therapy considering patient's history of hyperlipidemia, obtaining lipid panel in the morning.  Providing patient with as needed opiate-based analgesics for substantial pain.  Will provide patient with gentle intravenous hydration with isotonic fluids while temporarily holding home regimen of torsemide diuretic.  ABI and vascular arterial duplex of the left foot have both been ordered per Dr. Carlis Abbott recommendations.  X-ray of the left foot has also additionally been ordered.  We are unfortunately unable to order CT angiography due to elevated creatinine  Dr. Carlis Abbott with vascular surgery has stated they would be happy to consult on the patient in the morning.    Uncontrolled type 2 diabetes mellitus with stage 3 chronic kidney disease, with long-term current use of insulin (HCC)   Accu-Cheks before every meal and nightly with sliding scale insulin  Obtaining hemoglobin A1c  Continuing home regimen of Lantus 50 units every morning    Essential hypertension   Continue home regimen of antihypertensive therapy    Chronic diastolic heart failure ([place])  No evidence of cardiogenic volume overload at this time.  Will monitor for any  evidence of development of volume overload with intravenous gentle hydration and temporarily withholding diuretics    CKD (chronic kidney disease) stage 3, GFR 30-59 ml/min   Creatinine is somewhat up at 2.02 ordered creatinine of 1.54  Patient does not quite meet criteria for acute kidney injury however creatinine is somewhat up -possibly simply due to progression of chronic kidney disease   gentle intravenous hydration  Strict input and output monitoring  Temporarily holding diuretic  Monitor renal function with serial chemistries    Thrombocytopenia (HCC)   Mild and chronic  No evidence of bleeding  Monitoring with serial CBCs     Code Status:  Full code Family Communication: Deferred  Status is: Observation  The patient remains OBS appropriate and will d/c before 2 midnights.  Dispo: The patient is from: Home              Anticipated d/c is to: Home              Anticipated d/c date is: 1 day              Patient currently is not medically stable to d/c.        Vernelle Emerald MD [facility] Pager [phone]  If 7PM-7AM, please contact night-coverage [email or web address] Use universal [place] password for that web site. If you do not have the password, please call the hospital operator.  01/05/2020, 1:19 AM

## 2020-01-06 ENCOUNTER — Inpatient Hospital Stay (HOSPITAL_COMMUNITY): Payer: BC Managed Care – PPO

## 2020-01-06 DIAGNOSIS — I509 Heart failure, unspecified: Secondary | ICD-10-CM

## 2020-01-06 DIAGNOSIS — I70222 Atherosclerosis of native arteries of extremities with rest pain, left leg: Secondary | ICD-10-CM

## 2020-01-06 DIAGNOSIS — E119 Type 2 diabetes mellitus without complications: Secondary | ICD-10-CM

## 2020-01-06 DIAGNOSIS — I998 Other disorder of circulatory system: Secondary | ICD-10-CM

## 2020-01-06 LAB — GLUCOSE, CAPILLARY
Glucose-Capillary: 59 mg/dL — ABNORMAL LOW (ref 70–99)
Glucose-Capillary: 166 mg/dL — ABNORMAL HIGH (ref 70–99)
Glucose-Capillary: 245 mg/dL — ABNORMAL HIGH (ref 70–99)
Glucose-Capillary: 126 mg/dL — ABNORMAL HIGH (ref 70–99)
Glucose-Capillary: 275 mg/dL — ABNORMAL HIGH (ref 70–99)

## 2020-01-06 LAB — CBC WITH DIFFERENTIAL/PLATELET
Abs Immature Granulocytes: 0.01 10*3/uL (ref 0.00–0.07)
Basophils Absolute: 0 10*3/uL (ref 0.0–0.1)
Basophils Relative: 0 %
Eosinophils Absolute: 0.2 10*3/uL (ref 0.0–0.5)
Eosinophils Relative: 3 %
HCT: 32.9 % — ABNORMAL LOW (ref 36.0–46.0)
Hemoglobin: 10.4 g/dL — ABNORMAL LOW (ref 12.0–15.0)
Immature Granulocytes: 0 %
Lymphocytes Relative: 28 %
Lymphs Abs: 1.6 10*3/uL (ref 0.7–4.0)
MCH: 30.1 pg (ref 26.0–34.0)
MCHC: 31.6 g/dL (ref 30.0–36.0)
MCV: 95.1 fL (ref 80.0–100.0)
Monocytes Absolute: 0.8 10*3/uL (ref 0.1–1.0)
Monocytes Relative: 14 %
Neutro Abs: 3.3 10*3/uL (ref 1.7–7.7)
Neutrophils Relative %: 55 %
Platelets: 112 10*3/uL — ABNORMAL LOW (ref 150–400)
RBC: 3.46 MIL/uL — ABNORMAL LOW (ref 3.87–5.11)
RDW: 17.7 % — ABNORMAL HIGH (ref 11.5–15.5)
WBC: 5.9 10*3/uL (ref 4.0–10.5)
nRBC: 0 % (ref 0.0–0.2)

## 2020-01-06 LAB — BASIC METABOLIC PANEL
Anion gap: 7 (ref 5–15)
BUN: 50 mg/dL — ABNORMAL HIGH (ref 8–23)
CO2: 19 mmol/L — ABNORMAL LOW (ref 22–32)
Calcium: 8.4 mg/dL — ABNORMAL LOW (ref 8.9–10.3)
Chloride: 109 mmol/L (ref 98–111)
Creatinine, Ser: 2.48 mg/dL — ABNORMAL HIGH (ref 0.44–1.00)
GFR calc Af Amer: 23 mL/min — ABNORMAL LOW (ref >60)
GFR calc non Af Amer: 20 mL/min — ABNORMAL LOW (ref >60)
Glucose, Bld: 106 mg/dL — ABNORMAL HIGH (ref 70–99)
Potassium: 4.7 mmol/L (ref 3.5–5.1)
Sodium: 135 mmol/L (ref 135–145)

## 2020-01-06 LAB — URINALYSIS, ROUTINE W REFLEX MICROSCOPIC
Bilirubin Urine: NEGATIVE
Glucose, UA: NEGATIVE mg/dL
Hgb urine dipstick: NEGATIVE
Ketones, ur: NEGATIVE mg/dL
Nitrite: POSITIVE — AB
Protein, ur: NEGATIVE mg/dL
Specific Gravity, Urine: 1.014 (ref 1.005–1.030)
WBC, UA: >50 WBC/hpf — ABNORMAL HIGH (ref 0–5)
pH: 5 (ref 5.0–8.0)

## 2020-01-06 LAB — HEPARIN LEVEL (UNFRACTIONATED)
Heparin Unfractionated: 0.67 IU/mL (ref 0.30–0.70)
Heparin Unfractionated: 0.35 IU/mL (ref 0.30–0.70)

## 2020-01-06 LAB — MAGNESIUM: Magnesium: 2 mg/dL (ref 1.7–2.4)

## 2020-01-06 LAB — LDL CHOLESTEROL, DIRECT: Direct LDL: 65.7 mg/dL (ref 0–99)

## 2020-01-06 MED ORDER — SODIUM CHLORIDE 0.9 % IV SOLN
1.0000 g | INTRAVENOUS | Status: DC
  Administered 2020-01-06 – 2020-01-09 (×4): 1 g via INTRAVENOUS
  Filled 2020-01-06: qty 10
  Filled 2020-01-06 (×4): qty 1

## 2020-01-06 MED ORDER — INSULIN GLARGINE 100 UNIT/ML ~~LOC~~ SOLN
40.0000 [IU] | Freq: Every day | SUBCUTANEOUS | Status: DC
  Administered 2020-01-07 – 2020-01-09 (×2): 40 [IU] via SUBCUTANEOUS
  Filled 2020-01-06 (×3): qty 0.4

## 2020-01-06 MED ORDER — INSULIN GLARGINE 100 UNIT/ML ~~LOC~~ SOLN: 25.0000 [IU] | Freq: Every day | SUBCUTANEOUS | Status: DC

## 2020-01-06 MED ORDER — SODIUM CHLORIDE 0.9 % IV SOLN: INTRAVENOUS | Status: DC

## 2020-01-06 NOTE — Progress Notes (Signed)
ANTICOAGULATION CONSULT NOTE - Follow Up Consult  Pharmacy Consult for Heparin Indication: R/o ischemic left foot   Allergies  Allergen Reactions  . Pork Allergy   . Pork-Derived Products Nausea And Vomiting  . Whey Nausea And Vomiting    Just not in big amounts     Patient Measurements: Height: 5\' 1"  (154.9 cm) Weight: 102.5 kg (226 lb) IBW/kg (Calculated) : 47.8 Heparin Dosing Weight: 72.6 kg   Vital Signs: Temp: 98.3 F (36.8 C) (04/19 2227) Temp Source: Oral (04/19 2227) BP: 114/56 (04/19 2227)  Labs: Recent Labs    01/04/20 2227 01/05/20 1102 01/05/20 2026 01/06/20 0345  HGB 12.3 11.3*  --   --   HCT 38.3 36.4  --   --   PLT 131* 120*  --   --   HEPARINUNFRC  --   --  0.75* 0.67  CREATININE 2.02*  --   --  2.48*    Estimated Creatinine Clearance: 25.5 mL/min (A) (by C-G formula based on SCr of 2.48 mg/dL (H)).   Medical History: Past Medical History:  Diagnosis Date  . Achilles rupture   . Anxiety   . Arthritis   . Asthma   . Chronic back pain   . Chronic diastolic heart failure ([place]) 11/30/2016  . Chronic headaches   . Chronic leg pain    bilaterally  . CHRONIC OBSTRUCTIVE PULMONARY DISEASE, MODERATE 03/26/2010   Annotation: with moderate restrictive lung disease as well per PFTs  04/02/07--actual numbers not noted in old records Qualifier: Diagnosis of  By: Amil Amen MD, Benjamine Mola    . Depression   . Diabetes mellitus 1999   T2DM  . Glaucoma   . Gout   . H/O gestational diabetes mellitus, not currently pregnant   . Hyperlipidemia   . Hypertension   . Morbid obesity ([place])   . Obstructive sleep apnea on CPAP 07/02/2009   Qualifier: Diagnosis of  By: Amil Amen MD, Benjamine Mola    . OSA (obstructive sleep apnea)   . Sciatica     [name] with severe left foot pain concerning for subacute limb ischemia. Pharmacy consulted to start patient on IV heparin. Pt was on no anticoagulants PTA, per med rec.  Of note, patient has a  documented allergy (N/V) to pork-derived product, but she seems to have tolerated heparin and Lovenox injections recently.  Heparin level is therapeutic at 0.67 on 1000 units/hr.  No bleeding reported.   Goal of Therapy:  Heparin level 0.3-0.7 units/ml Monitor platelets by anticoagulation protocol: Yes   Plan:  Continue heparin infusion to 1000 units/hr Check 8-hr heparin level to ensure no further accumulation.  Monitor daily heparin level, CBC Monitor for signs/symptoms of bleeding F/U Vascular recommendations  Sloan Leiter, PharmD, BCPS, BCCCP Clinical Pharmacist Please refer to [facility] for [place] numbers 01/06/2020, 5:28 AM

## 2020-01-06 NOTE — Consult Note (Signed)
[facility] [place]  Patient name: [name] MRN: [id number] DOB: 06/30/1956 Sex: female   REQUESTING PROVIDER:    Hospital service   REASON FOR CONSULT:    Left leg pain  HISTORY OF PRESENT ILLNESS:   [name] is a 64 y.o. female, who presented to the emergency department on 01/04/2020 with complaints of left leg pain that have been going on for approximately 1 month.  She has a history of gout in that leg however this did not feel like her prior gout attacks.  She was treated with steroids and gout medication without improvement which led her to come to the emergency department.  She said this time her foot was cold and not warm.  She notes some difficulty moving her toes but has normal sensation.  The patient suffers from type 2 diabetes.  She has stage III chronic renal insufficiency.  Her most recent creatinine is 2.5.  This is up from 2.0 several months ago.  She does have a history of congestive heart failure.  She takes a statin for hypercholesterolemia.  She is medically managed for hypertension.  She is a former smoker.  PAST MEDICAL HISTORY    Past Medical History:  Diagnosis Date  . Achilles rupture   . Anxiety   . Arthritis   . Asthma   . Chronic back pain   . Chronic diastolic heart failure ([place]) 11/30/2016  . Chronic headaches   . Chronic leg pain    bilaterally  . CHRONIC OBSTRUCTIVE PULMONARY DISEASE, MODERATE 03/26/2010   Annotation: with moderate restrictive lung disease as well per PFTs  04/02/07--actual numbers not noted in old records Qualifier: Diagnosis of  By: Amil Amen MD, Benjamine Mola    . Depression   . Diabetes mellitus 1999   T2DM  . Glaucoma   . Gout   . H/O gestational diabetes mellitus, not currently pregnant   . Hyperlipidemia   . Hypertension   . Morbid obesity ([place])   . Obstructive sleep apnea on CPAP 07/02/2009   Qualifier: Diagnosis of  By: Amil Amen MD, Benjamine Mola    . OSA  (obstructive sleep apnea)   . Sciatica      FAMILY HISTORY   Family History  Problem Relation Age of Onset  . Heart failure Mother   . Renal Disease Mother     SOCIAL HISTORY:   Social History   Socioeconomic History  . Marital status: Single    Spouse name: Not on file  . Number of children: Not on file  . Years of education: Not on file  . Highest education level: Not on file  Occupational History  . Not on file  Tobacco Use  . Smoking status: Former Smoker    Types: Cigarettes    Quit date: 09/04/1998    Years since quitting: 21.3  . Smokeless tobacco: Never Used  Substance and Sexual Activity  . Alcohol use: Yes    Comment: occasional  . Drug use: No  . Sexual activity: Yes    Birth control/protection: None  Other Topics Concern  . Not on file  Social History Narrative  . Not on file   Social Determinants of Health   Financial Resource Strain:   . Difficulty of Paying Living Expenses:   Food Insecurity:   . Worried About Charity fundraiser in the Last Year:   . Arboriculturist in the Last Year:   Transportation Needs:   . Film/video editor (Medical):   Marland Kitchen  Lack of Transportation (Non-Medical):   Physical Activity:   . Days of Exercise per Week:   . Minutes of Exercise per Session:   Stress:   . Feeling of Stress :   Social Connections:   . Frequency of Communication with Friends and Family:   . Frequency of Social Gatherings with Friends and Family:   . Attends Religious Services:   . Active Member of Clubs or Organizations:   . Attends Archivist Meetings:   Marland Kitchen Marital Status:   Intimate Partner Violence:   . Fear of Current or Ex-Partner:   . Emotionally Abused:   Marland Kitchen Physically Abused:   . Sexually Abused:     ALLERGIES:    Allergies  Allergen Reactions  . Pork Allergy   . Pork-Derived Products Nausea And Vomiting    CURRENT MEDICATIONS:    Current Facility-Administered Medications  Medication Dose Route Frequency  Provider Last Rate Last Admin  . 0.9 %  sodium chloride infusion   Intravenous Continuous Alma Friendly, MD      . acetaminophen (TYLENOL) tablet 650 mg  650 mg Oral Q6H PRN Shalhoub, Sherryll Burger, MD       Or  . acetaminophen (TYLENOL) suppository 650 mg  650 mg Rectal Q6H PRN Shalhoub, Sherryll Burger, MD      . albuterol (PROVENTIL) (2.5 MG/3ML) 0.083% nebulizer solution 2.5 mg  2.5 mg Nebulization Q4H PRN Shalhoub, Sherryll Burger, MD      . aspirin EC tablet 81 mg  81 mg Oral Q breakfast Vernelle Emerald, MD   81 mg at 01/06/20 0914  . atorvastatin (LIPITOR) tablet 40 mg  40 mg Oral Daily Alma Friendly, MD   40 mg at 01/05/20 2355  . brimonidine (ALPHAGAN) 0.15 % ophthalmic solution 1 drop  1 drop Both Eyes BID Shalhoub, Sherryll Burger, MD   1 drop at 01/06/20 0915  . cefTRIAXone (ROCEPHIN) 1 g in sodium chloride 0.9 % 100 mL IVPB  1 g Intravenous Q24H Alma Friendly, MD      . colchicine tablet 0.6 mg  0.6 mg Oral Daily Shalhoub, Sherryll Burger, MD   0.6 mg at 01/06/20 0914  . dorzolamide-timolol (COSOPT) 22.3-6.8 MG/ML ophthalmic solution 1 drop  1 drop Both Eyes BID Vernelle Emerald, MD   1 drop at 01/06/20 0921  . doxazosin (CARDURA) tablet 4 mg  4 mg Oral QHS Shalhoub, Sherryll Burger, MD   4 mg at 01/05/20 2233  . heparin ADULT infusion 100 units/mL (25000 units/253mL sodium chloride 0.45%)  1,000 Units/hr Intravenous Continuous Alma Friendly, MD 10 mL/hr at 01/05/20 2232 1,000 Units/hr at 01/05/20 2232  . HYDROmorphone (DILAUDID) injection 0.5 mg  0.5 mg Intravenous Q4H PRN Alma Friendly, MD   0.5 mg at 01/06/20 1438  . insulin aspart (novoLOG) injection 0-15 Units  0-15 Units Subcutaneous TID AC & HS Shalhoub, Sherryll Burger, MD   5 Units at 01/06/20 1303  . [START ON 01/07/2020] insulin glargine (LANTUS) injection 40 Units  40 Units Subcutaneous Q breakfast Alma Friendly, MD      . latanoprost (XALATAN) 0.005 % ophthalmic solution 1 drop  1 drop Both Eyes QHS Shalhoub, Sherryll Burger, MD    1 drop at 01/05/20 2343  . ondansetron (ZOFRAN) tablet 4 mg  4 mg Oral Q6H PRN Shalhoub, Sherryll Burger, MD       Or  . ondansetron [facility]) injection 4 mg  4 mg Intravenous Q6H PRN Shalhoub, Sherryll Burger, MD  4 mg at 01/06/20 1502  . oxyCODONE-acetaminophen (PERCOCET/ROXICET) 5-325 MG per tablet 1-2 tablet  1-2 tablet Oral Q6H PRN Alma Friendly, MD   1 tablet at 01/06/20 0914  . polyethylene glycol (MIRALAX / GLYCOLAX) packet 17 g  17 g Oral Daily PRN Shalhoub, Sherryll Burger, MD      . prednisoLONE acetate (PRED FORTE) 1 % ophthalmic suspension 1 drop  1 drop Right Eye BID Shalhoub, Sherryll Burger, MD   1 drop at 01/06/20 [phone]  . valACYclovir (VALTREX) tablet 1,000 mg  1,000 mg Oral BID Priscella Mann, RPH   1,000 mg at 01/06/20 [id number]    REVIEW OF SYSTEMS:   [X]  denotes positive finding, [ ]  denotes negative finding Cardiac  Comments:  Chest pain or chest pressure:    Shortness of breath upon exertion:    Short of breath when lying flat:    Irregular heart rhythm:        Vascular    Pain in calf, thigh, or hip brought on by ambulation: x   Pain in feet at night that wakes you up from your sleep:  x   Blood clot in your veins:    Leg swelling:         Pulmonary    Oxygen at home:    Productive cough:     Wheezing:         Neurologic    Sudden weakness in arms or legs:     Sudden numbness in arms or legs:     Sudden onset of difficulty speaking or slurred speech:    Temporary loss of vision in one eye:     Problems with dizziness:         Gastrointestinal    Blood in stool:      Vomited blood:         Genitourinary    Burning when urinating:     Blood in urine:        Psychiatric    Major depression:         Hematologic    Bleeding problems:    Problems with blood clotting too easily:        Skin    Rashes or ulcers:        Constitutional    Fever or chills:     PHYSICAL EXAM:   Vitals:   01/05/20 1600 01/05/20 2227 01/06/20 0700 01/06/20 1642  BP: (!) 112/59 (!)  114/56 (!) 123/53 (!) 160/68  Pulse: (!) 59  62 61  Resp: 16 16 18 20   Temp:  98.3 F (36.8 C) 98.9 F (37.2 C) 98.6 F (37 C)  TempSrc:  Oral Oral Oral  SpO2: 98% 100% 100% 100%  Weight:      Height:        GENERAL: The patient is a well-nourished female, in no acute distress. The vital signs are documented above. CARDIAC: There is a regular rate and rhythm.  VASCULAR: Palpable femoral pulses bilaterally.  Palpable right dorsalis pedis pulse.  Nonpalpable left pedal or popliteal pulse PULMONARY: Nonlabored respirations ABDOMEN: Soft and non-tender with normal pitched bowel sounds.  MUSCULOSKELETAL: There are no major deformities or cyanosis. NEUROLOGIC: Intact sensation in left foot however she cannot move her toes as well as she can on the right. SKIN: There are no ulcers or rashes noted. PSYCHIATRIC: The patient has a normal affect.  STUDIES:   I have reviewed her vascular lab studies with the following results:  +-------+-----------+-----------+------------+------------+  ABI/TBIToday's ABIToday's TBIPrevious ABIPrevious TBI  +-------+-----------+-----------+------------+------------+  Right 0.91    0.51                  +-------+-----------+-----------+------------+------------+  Left  0.44    0                   +-------+-----------+-----------+------------+------------+  Left: The external iliac, common femoral, and superficial femoral arteries appear patent with no significant stenosis. Monophasic waveforms are noted in the proximal popliteal artery with poor visualization of the distal segment. Which suggests a possible occlusion. Anterior tibial artery appears patent throughout with monophasic flow. The posterior tibial, and peroneal arteries demonstrated monophasic flow with likely areas of occlusive disease. The proximal calf arteries were difficult to visualize.  ASSESSMENT and PLAN   Atherosclerotic  vascular disease with borderline rest pain in the left foot: I discussed with the patient and her daughter via telephone that the neck step is to proceed with angiography to better define her anatomy and possibly proceed with percutaneous intervention.  This will be through a right femoral approach.  The details of the procedure were discussed with the patient as well as the risks and benefits.  If she is amenable to percutaneous intervention it would be performed at that time.  If she requires surgical revascularization this would be done at a different date.  The biggest issue right now is her renal function.  Her creatinine is 2.5.  I would prefer to continue with hydration to see if we can get this to calm down.  Nephrotoxic medications have been discontinued.  Renal consultation may be required.  She will be n.p.o. after midnight tonight for possible intervention tomorrow however if her creatinine remains elevated this will need to be delayed.   Leia Alf, MD, FACS [facility] [phone] Pager [phone]

## 2020-01-06 NOTE — Progress Notes (Addendum)
Inpatient Diabetes Program Recommendations  AACE/ADA: New Consensus Statement on Inpatient Glycemic Control (2015)  Target Ranges:  Prepandial:   less than 140 mg/dL      Peak postprandial:   less than 180 mg/dL (1-2 hours)      Critically ill patients:  140 - 180 mg/dL   Lab Results  Component Value Date   GLUCAP 166 (H) 01/06/2020   HGBA1C 12.2 (H) 01/05/2020    Review of Glycemic Control Results for [name], [name] (MRN [id number]) as of 01/06/2020 10:17  Ref. Range 01/05/2020 12:42 01/05/2020 16:40 01/05/2020 21:04 01/06/2020 07:37 01/06/2020 09:01  Glucose-Capillary Latest Ref Range: 70 - 99 mg/dL 173 (H) 134 (H) 124 (H) 59 (L) 166 (H)   Diabetes history: DM 2 Outpatient Diabetes medications: Lantus 50 units q AM, Novolog 3-20 units tid with meals for blood sugars>150 mg/dL Current orders for Inpatient glycemic control:  Lantus 50 units q AM, Novolog moderate tid with meals and HS Inpatient Diabetes Program Recommendations:    Note low blood sugar this AM.  Please consider reducing Lantus to 40 units q AM.   Thanks,  Adah Perl, RN, BC-ADM Inpatient Diabetes Coordinator Pager [phone] (8a-5p)  [phone]- Per patient:  She had been on back-to-back prednisone due to possible gout, stated CBGs were in the 500s for a while. Discussed A1C with patient.  She states that prior to Prednisone, she had an A1C of 7%.  She does note that she also frequently had lows prior to being on steroids, and had to make sure she ate to prevent lows.  Explained that she may be on too much basal insulin if she is having lows, especially fasting.  Note Lantus reduced today.  Will follow.  Encouraged patient to f/u with PCP as well.  Discussed avoidance of low blood sugars and treatment as well.

## 2020-01-06 NOTE — Progress Notes (Signed)
ANTICOAGULATION CONSULT NOTE - Follow Up Consult  Pharmacy Consult for Heparin Indication: R/o ischemic left foot   Patient Measurements: Height: 5\' 1"  (154.9 cm) Weight: 102.5 kg (226 lb) IBW/kg (Calculated) : 47.8 Heparin Dosing Weight: 72.6 kg   Vital Signs: Temp: 98.9 F (37.2 C) (04/20 0700) Temp Source: Oral (04/20 0700) BP: 123/53 (04/20 0700) Pulse Rate: 62 (04/20 0700)  Labs: Recent Labs    01/04/20 2227 01/04/20 2227 01/05/20 1102 01/05/20 2026 01/06/20 0345 01/06/20 1230  HGB 12.3   < > 11.3*  --  10.4*  --   HCT 38.3  --  36.4  --  32.9*  --   PLT 131*  --  120*  --  112*  --   HEPARINUNFRC  --   --   --  0.75* 0.67 0.35  CREATININE 2.02*  --   --   --  2.48*  --    < > = values in this interval not displayed.    Estimated Creatinine Clearance: 25.5 mL/min (A) (by C-G formula based on SCr of 2.48 mg/dL (H)).  [name] with severe left foot pain concerning for subacute limb ischemia. Pharmacy consulted to start patient on IV heparin. Pt was on no anticoagulants PTA, per med rec.  Of note, patient has a documented allergy (N/V) to pork-derived product, but she seems to have tolerated heparin and Lovenox injections recently.  Heparin level is therapeutic at 0.35 on 1000 units/hr. No bleeding reported.   Goal of Therapy:  Heparin level 0.3-0.7 units/ml Monitor platelets by anticoagulation protocol: Yes   Plan:  Continue heparin gtt 1000 units/hr Daily heparin level and CBC  Salome Arnt, PharmD, BCPS Clinical Pharmacist Please see AMION for all pharmacy numbers 01/06/2020 1:06 PM

## 2020-01-06 NOTE — Evaluation (Signed)
Physical Therapy Evaluation Patient Details Name: [name] MRN: [id number] DOB: [birth year] Today's Date: 01/06/2020   History of Present Illness  64 year old female with past medical history of chronic low back pain, chronic diastolic congestive heart failure, diabetes mellitus type 2, gout, hyperlipidemia, hypertension, obstructive sleep apnea and obesity who presents to [facility] emergency department on 4/18 with complaints of left foot pain. LLE vascular ultrasound reveals occlusive disease of popliteal, posterior tibial, and peroneal arteries.  Clinical Impression   Pt presents with LE weakness, unsteadiness in standing with history of falls, decreased activity tolerance, and L foot pain. Pt to benefit from acute PT to address deficits. Pt ambulated short room distance with x1 seated rest break due to fatigue and L foot pain. Pt lives with her daughter in a handicap accessible apartment, and has aide assist daily. PT recommending HHPT to maximize pt mobility and safety in home environment. PT to progress mobility as tolerated, and will continue to follow acutely.      Follow Up Recommendations Home health PT;Supervision for mobility/OOB    Equipment Recommendations  None recommended by PT    Recommendations for Other Services       Precautions / Restrictions Precautions Precautions: Fall Restrictions Weight Bearing Restrictions: No      Mobility  Bed Mobility Overal bed mobility: Needs Assistance             General bed mobility comments: pt up in chair upon PT arrival to room and requesting stay in chair upon PT exit.  Transfers Overall transfer level: Needs assistance Equipment used: Rolling walker (2 wheeled) Transfers: Sit to/from Stand Sit to Stand: Min guard         General transfer comment: min guard for safety, verbal cuing for hand placement when rising. Stand from recliner and chair in room during rest break, increased difficulty with lower  surfaces.  Ambulation/Gait Ambulation/Gait assistance: Min guard Gait Distance (Feet): 15 [id number]) Assistive device: Rolling walker (2 wheeled) Gait Pattern/deviations: Step-through pattern;Decreased stride length;Trunk flexed;Antalgic Gait velocity: decr   General Gait Details: min guard for safety, verbal cuing for upright posture and placement in RW. Verbal cuing to take rest break as needed during gait, seated rest break x2 minutes after 15 ft ambulation.  Stairs            Wheelchair Mobility    Modified Rankin (Stroke Patients Only)       Balance Overall balance assessment: Needs assistance;History of Falls Sitting-balance support: No upper extremity supported;Feet supported Sitting balance-Leahy Scale: Fair     Standing balance support: Bilateral upper extremity supported Standing balance-Leahy Scale: Poor Standing balance comment: reliant on external support in standing                             Pertinent Vitals/Pain Pain Assessment: Faces Faces Pain Scale: Hurts even more Pain Location: L foot, achilles region Pain Descriptors / Indicators: Discomfort;Sore;Sharp;Cramping Pain Intervention(s): Limited activity within patient's tolerance;Premedicated before session;Monitored during session;Repositioned    Home Living Family/patient expects to be discharged to:: Private residence Living Arrangements: Children Available Help at Discharge: Family;Available PRN/intermittently Type of Home: Apartment Home Access: Level entry     Home Layout: One level Home Equipment: Shower seat;Wheelchair - [employer] - 2 wheels;Cane - single point;Walker - 4 wheels Additional Comments: wheelchair is falling apart    Prior Function Level of Independence: Needs assistance   Gait / Transfers Assistance Needed: pt reports using cane to  get to and from bathroom. to get around living spaces pt uses RW.  ADL's / Homemaking Assistance Needed: has an aide for a  couple hours a day (18 hours a week), 7 days a week. Assists with dressing, bathing in tub, cooking, and cleaning        Hand Dominance   Dominant Hand: Right    Extremity/Trunk Assessment   Upper Extremity Assessment Upper Extremity Assessment: Overall WFL for tasks assessed    Lower Extremity Assessment Lower Extremity Assessment: Generalized weakness    Cervical / Trunk Assessment Cervical / Trunk Assessment: Normal  Communication   Communication: No difficulties  Cognition Arousal/Alertness: Awake/alert Behavior During Therapy: WFL for tasks assessed/performed Overall Cognitive Status: Within Functional Limits for tasks assessed                                 General Comments: pt talkative and pleasant      General Comments General comments (skin integrity, edema, etc.): vss    Exercises     Assessment/Plan    PT Assessment Patient needs continued PT services  PT Problem List Decreased strength;Decreased mobility;Decreased activity tolerance;Decreased balance;Decreased knowledge of use of DME;Pain;Obesity       PT Treatment Interventions DME instruction;Therapeutic activities;Gait training;Patient/family education;Therapeutic exercise;Balance training;Functional mobility training;Neuromuscular re-education    PT Goals (Current goals can be found in the Care Plan section)  Acute Rehab PT Goals Patient Stated Goal: go home PT Goal Formulation: With patient Time For Goal Achievement: 01/20/20 Potential to Achieve Goals: Good    Frequency Min 3X/week   Barriers to discharge        Co-evaluation               AM-PAC PT "6 Clicks" Mobility  Outcome Measure Help needed turning from your back to your side while in a flat bed without using bedrails?: A Little Help needed moving from lying on your back to sitting on the side of a flat bed without using bedrails?: A Little Help needed moving to and from a bed to a chair (including a  wheelchair)?: A Little Help needed standing up from a chair using your arms (e.g., wheelchair or bedside chair)?: A Little Help needed to walk in hospital room?: A Little Help needed climbing 3-5 steps with a railing? : A Lot 6 Click Score: 17    End of Session Equipment Utilized During Treatment: Gait belt Activity Tolerance: Patient tolerated treatment well;Patient limited by pain Patient left: in chair;with call bell/phone within reach;with chair alarm set Nurse Communication: Mobility status PT Visit Diagnosis: Other abnormalities of gait and mobility (R26.89);Muscle weakness (generalized) (M62.81)    Time: 1000-1020 PT Time Calculation (min) (ACUTE ONLY): 20 min   Charges:   PT Evaluation $PT Eval Low Complexity: 1 Low        Onur Mori E, PT Acute Rehabilitation Services Pager [phone]  Office [phone]  Tadhg Eskew D Elonda Husky 01/06/2020, 11:27 AM

## 2020-01-06 NOTE — Progress Notes (Signed)
PROGRESS NOTE  [name] [id number] DOB: 03-Dec-1955 DOA: 01/04/2020 PCP: Javier Docker, MD  HPI/Recap of past 24 hours: HPI from Dr Cyd Silence 64 year old female with past medical history of chronic low back pain, chronic diastolic CHF, DM type 2, gout, HLD, HTN, OSA and obesity who presents with complaints of left foot pain. Patient explains that approximately 1 month ago she began to develop left foot pain. Patient's pain continued to worsen to the point where she presented to a local urgent care clinic twice several weeks ago and was managed for gout with prednisone twice. Patient then presented to her PCP approximately 1 week ago where she was placed on a course of colchicine 0.6 mg twice daily and told to take it for 14 days.  Despite taking this, her pain continued to worsen. Patient states that her pain got to the point where she has been unable to ambulate around her home even when using an assistive device such as a walker. Upon evaluation in the ED, patient was found to have severe pain in the left foot, as well as cool to the touch with diminished pulses concerning for subacute limb ischemia.  Case was discussed with Dr. Carlis Abbott with vascular surgery who recommended obtaining vascular arterial duplex as well as an ABI.  Patient admitted for further management.    Today, patient still reporting pain in left foot, controlled by narcotics.  Denies any chest pain, abdominal pain, nausea/vomiting, fever/chills.    Assessment/Plan: Active Problems:   Uncontrolled type 2 diabetes mellitus with stage 3 chronic kidney disease, with long-term current use of insulin (HCC)   Essential hypertension   Chronic diastolic heart failure (HCC)   CKD (chronic kidney disease) stage 3, GFR 30-59 ml/min   Thrombocytopenia (HCC)   Acute foot pain, left   Lower limb ischemia   ?? Subacute limb ischemia of LLE Peripheral vascular disease History as above ABI on the right 0.91, on the left is  0.44 Lower extremity arterial duplex showed possible occlusion in the proximal popliteal artery LDL not calculated, total cholesterol 172 Lactic acid 1 Vascular surgery consulted, spoke to Dr. Oneida Alar on 01/05/2020 as well as Dr. Trula Slade on 01/06/2020, yet to see patient Start statins, aspirin Start IV Heparin drip for now pending vascular surgery recommendations  Diabetes mellitus type 2 Uncontrolled (patient had been on back-to-back prednisone due to possible gout, stated CBGs were in the 500s for a while) Repeat A1c 12.2 SSI, Lantus, Accu-Cheks, hypoglycemic protocol  CKD stage III No baseline to compare as last creatinine was in 2019, although appears to be around baseline Renal ultrasound pending S/p gentle hydration Strict I's and O's Continue to hold diuretic for now Daily BMP  Anemia of chronic kidney disease Unknown baseline Daily CBC  Possible UTI Currently afebrile, with no leukocytosis UA showed positive nitrites, large leukocytes, many bacteria, greater than 50 WBC UC pending BC x2 NGTD  Hypomagnesemia Replace as needed  Thrombocytopenia Unknown etiology Daily CBC  History of gout Uric acid 10.3 Continue colchicine  Chronic diastolic HF Appears euvolemic Echo done in 2018 showed EF of 60 to 65%, no regional wall motion abnormality, grade 2 diastolic dysfunction Strict I's and O's, daily weights Hold home diuretics for now  Hypertension Hold home lisinopril due to CKD  Morbid obesity Lifestyle modification advised       Malnutrition Type:      Malnutrition Characteristics:      Nutrition Interventions:       Estimated body mass index  is 42.7 kg/m as calculated from the following:   Height as of this encounter: 5\' 1"  (1.549 m).   Weight as of this encounter: 102.5 kg.     Code Status: Full  Family Communication: Discussed extensively with patient  Disposition Plan: Patient came from home, plan to DC to home, pending work-up  completion, consultant signing off.   Consultants:  Vascular surgery  Procedures:  None  Antimicrobials:  Ceftriaxone  DVT prophylaxis: Heparin   Objective: Vitals:   01/05/20 1415 01/05/20 1600 01/05/20 2227 01/06/20 0700  BP: 129/73 (!) 112/59 (!) 114/56 (!) 123/53  Pulse: 62 (!) 59  62  Resp: 16 16 16 18   Temp:   98.3 F (36.8 C) 98.9 F (37.2 C)  TempSrc:   Oral Oral  SpO2: 95% 98% 100% 100%  Weight:      Height:        Intake/Output Summary (Last 24 hours) at 01/06/2020 1515 Last data filed at 01/06/2020 1000 Gross per 24 hour  Intake 1852.38 ml  Output --  Net 1852.38 ml   Filed Weights   01/04/20 2128  Weight: 102.5 kg    Exam:  General: NAD  Cardiovascular: S1, S2 present  Respiratory: CTAB  Abdomen: Soft, nontender, nondistended, bowel sounds present  Musculoskeletal: Trace bilateral pedal edema noted, markedly diminished pulses in the left foot  Skin: Normal  Psychiatry: Normal mood   Data Reviewed: CBC: Recent Labs  Lab 01/04/20 2227 01/05/20 1102 01/06/20 0345  WBC 5.8 5.9 5.9  NEUTROABS 3.0 3.5 3.3  HGB 12.3 11.3* 10.4*  HCT 38.3 36.4 32.9*  MCV 95.3 98.9 95.1  PLT 131* 120* [id number]*   Basic Metabolic Panel: Recent Labs  Lab 01/04/20 2227 01/05/20 1102 01/06/20 0345  NA 137  --  135  K 4.4  --  4.7  CL 108  --  109  CO2 21*  --  19*  GLUCOSE 189*  --  106*  BUN 47*  --  50*  CREATININE 2.02*  --  2.48*  CALCIUM 8.9  --  8.4*  MG  --  1.5* 2.0   GFR: Estimated Creatinine Clearance: 25.5 mL/min (A) (by C-G formula based on SCr of 2.48 mg/dL (H)). Liver Function Tests: Recent Labs  Lab 01/04/20 2227  AST 29  ALT 36  ALKPHOS 58  BILITOT 0.5  PROT 6.7  ALBUMIN 3.5   No results for input(s): LIPASE, AMYLASE in the last 168 hours. No results for input(s): AMMONIA in the last 168 hours. Coagulation Profile: No results for input(s): INR, PROTIME in the last 168 hours. Cardiac Enzymes: No results for input(s):  CKTOTAL, CKMB, CKMBINDEX, TROPONINI in the last 168 hours. BNP (last 3 results) No results for input(s): PROBNP in the last 8760 hours. HbA1C: Recent Labs    01/05/20 1102  HGBA1C 12.2*   CBG: Recent Labs  Lab 01/05/20 1640 01/05/20 2104 01/06/20 0737 01/06/20 0901 01/06/20 1144  GLUCAP 134* 124* 59* 166* 245*   Lipid Profile: Recent Labs    01/05/20 1102 01/06/20 0345  CHOL 172  --   HDL 71  --   LDLCALC NOT CALCULATED  --   TRIG 60  --   CHOLHDL 2.4  --   LDLDIRECT  --  65.7   Thyroid Function Tests: No results for input(s): TSH, T4TOTAL, FREET4, T3FREE, THYROIDAB in the last 72 hours. Anemia Panel: No results for input(s): VITAMINB12, FOLATE, FERRITIN, TIBC, IRON, RETICCTPCT in the last 72 hours. Urine analysis:  Component Value Date/Time   COLORURINE YELLOW 01/06/2020 1400   APPEARANCEUR CLOUDY (A) 01/06/2020 1400   LABSPEC 1.014 01/06/2020 1400   PHURINE 5.0 01/06/2020 1400   GLUCOSEU NEGATIVE 01/06/2020 1400   HGBUR NEGATIVE 01/06/2020 1400   HGBUR negative 08/20/2009 0807   BILIRUBINUR NEGATIVE 01/06/2020 1400   BILIRUBINUR neg 05/14/2013 1111   KETONESUR NEGATIVE 01/06/2020 1400   PROTEINUR NEGATIVE 01/06/2020 1400   UROBILINOGEN 1.0 11/11/2014 1215   NITRITE POSITIVE (A) 01/06/2020 1400   LEUKOCYTESUR LARGE (A) 01/06/2020 1400   Sepsis Labs: @LABRCNTIP (procalcitonin:4,lacticidven:4)  ) Recent Results (from the past 240 hour(s))  Blood culture (routine x 2)     Status: None (Preliminary result)   Collection Time: 01/04/20 10:30 PM   Specimen: BLOOD  Result Value Ref Range Status   Specimen Description BLOOD LEFT ANTECUBITAL  Final   Special Requests   Final    BOTTLES DRAWN AEROBIC AND ANAEROBIC Blood Culture adequate volume   Culture   Final    NO GROWTH 2 DAYS Performed at [facility], [place] [address] [place], [place] [address]    Report Status PENDING  Incomplete  Blood culture (routine x 2)     Status: None (Preliminary  result)   Collection Time: 01/04/20 10:30 PM   Specimen: BLOOD  Result Value Ref Range Status   Specimen Description BLOOD RIGHT ANTECUBITAL  Final   Special Requests   Final    BOTTLES DRAWN AEROBIC AND ANAEROBIC Blood Culture results may not be optimal due to an excessive volume of blood received in culture bottles   Culture   Final    NO GROWTH 2 DAYS Performed at [facility], [place] [address] [place], [place] [address]    Report Status PENDING  Incomplete  SARS CORONAVIRUS 2 (TAT 6-24 HRS) Nasopharyngeal Nasopharyngeal Swab     Status: None   Collection Time: 01/05/20 12:57 AM   Specimen: Nasopharyngeal Swab  Result Value Ref Range Status   SARS Coronavirus 2 NEGATIVE NEGATIVE Final    Comment: (NOTE) SARS-CoV-2 target nucleic acids are NOT DETECTED. The SARS-CoV-2 RNA is generally detectable in upper and lower respiratory specimens during the acute phase of infection. Negative results do not preclude SARS-CoV-2 infection, do not rule out co-infections with other pathogens, and should not be used as the sole basis for treatment or other patient management decisions. Negative results must be combined with clinical observations, patient history, and epidemiological information. The expected result is Negative. Fact Sheet for Patients: SugarRoll.be Fact Sheet for Healthcare Providers: https://www.woods-mathews.com/ This test is not yet approved or cleared by the [place] FDA and  has been authorized for detection and/or diagnosis of SARS-CoV-2 by FDA under an Emergency Use Authorization (EUA). This EUA will remain  in effect (meaning this test can be used) for the duration of the COVID-19 declaration under Section 56 4(b)(1) of the Act, 21 U.S.C. section 360bbb-3(b)(1), unless the authorization is terminated or revoked sooner. Performed at [facility], [place] [address] [place], [place] [address]       Studies: No  results found.  Scheduled Meds: . aspirin EC  81 mg Oral Q breakfast  . atorvastatin  40 mg Oral Daily  . brimonidine  1 drop Both Eyes BID  . colchicine  0.6 mg Oral Daily  . dorzolamide-timolol  1 drop Both Eyes BID  . doxazosin  4 mg Oral QHS  . insulin aspart  0-15 Units Subcutaneous TID AC & HS  . [  START ON 01/07/2020] insulin glargine  40 Units Subcutaneous Q breakfast  . latanoprost  1 drop Both Eyes QHS  . prednisoLONE acetate  1 drop Right Eye BID  . valACYclovir  1,000 mg Oral BID    Continuous Infusions: . heparin 1,000 Units/hr (01/05/20 2232)     LOS: 1 day     Alma Friendly, MD [facility]  If 7PM-7AM, please contact night-coverage [email or web address] 01/06/2020, 3:15 PM

## 2020-01-07 ENCOUNTER — Encounter (HOSPITAL_COMMUNITY)
Admission: EM | Disposition: A | Discharge status: Home Health | Payer: Self-pay | Source: Home / Self Care | Attending: Internal Medicine

## 2020-01-07 LAB — GLUCOSE, CAPILLARY
Glucose-Capillary: 172 mg/dL — ABNORMAL HIGH (ref 70–99)
Glucose-Capillary: 226 mg/dL — ABNORMAL HIGH (ref 70–99)
Glucose-Capillary: 318 mg/dL — ABNORMAL HIGH (ref 70–99)
Glucose-Capillary: 203 mg/dL — ABNORMAL HIGH (ref 70–99)

## 2020-01-07 LAB — CBC WITH DIFFERENTIAL/PLATELET
Abs Immature Granulocytes: 0.03 10*3/uL (ref 0.00–0.07)
Basophils Absolute: 0 10*3/uL (ref 0.0–0.1)
Basophils Relative: 1 %
Eosinophils Absolute: 0.1 10*3/uL (ref 0.0–0.5)
Eosinophils Relative: 3 %
HCT: 31.6 % — ABNORMAL LOW (ref 36.0–46.0)
Hemoglobin: 10.1 g/dL — ABNORMAL LOW (ref 12.0–15.0)
Immature Granulocytes: 1 %
Lymphocytes Relative: 15 %
Lymphs Abs: 0.6 10*3/uL — ABNORMAL LOW (ref 0.7–4.0)
MCH: 30.4 pg (ref 26.0–34.0)
MCHC: 32 g/dL (ref 30.0–36.0)
MCV: 95.2 fL (ref 80.0–100.0)
Monocytes Absolute: 0.5 10*3/uL (ref 0.1–1.0)
Monocytes Relative: 13 %
Neutro Abs: 2.6 10*3/uL (ref 1.7–7.7)
Neutrophils Relative %: 67 %
Platelets: 102 10*3/uL — ABNORMAL LOW (ref 150–400)
RBC: 3.32 MIL/uL — ABNORMAL LOW (ref 3.87–5.11)
RDW: 17.4 % — ABNORMAL HIGH (ref 11.5–15.5)
WBC: 3.8 10*3/uL — ABNORMAL LOW (ref 4.0–10.5)
nRBC: 0 % (ref 0.0–0.2)

## 2020-01-07 LAB — BASIC METABOLIC PANEL
Anion gap: 10 (ref 5–15)
BUN: 50 mg/dL — ABNORMAL HIGH (ref 8–23)
CO2: 18 mmol/L — ABNORMAL LOW (ref 22–32)
Calcium: 8.3 mg/dL — ABNORMAL LOW (ref 8.9–10.3)
Chloride: 107 mmol/L (ref 98–111)
Creatinine, Ser: 2.18 mg/dL — ABNORMAL HIGH (ref 0.44–1.00)
GFR calc Af Amer: 27 mL/min — ABNORMAL LOW (ref >60)
GFR calc non Af Amer: 23 mL/min — ABNORMAL LOW (ref >60)
Glucose, Bld: 271 mg/dL — ABNORMAL HIGH (ref 70–99)
Potassium: 5 mmol/L (ref 3.5–5.1)
Sodium: 135 mmol/L (ref 135–145)

## 2020-01-07 LAB — HEPARIN LEVEL (UNFRACTIONATED): Heparin Unfractionated: 0.44 IU/mL (ref 0.30–0.70)

## 2020-01-07 SURGERY — LOWER EXTREMITY ANGIOGRAPHY
Anesthesia: LOCAL

## 2020-01-07 MED ORDER — INSULIN ASPART 100 UNIT/ML ~~LOC~~ SOLN
0.0000 [IU] | Freq: Three times a day (TID) | SUBCUTANEOUS | Status: DC
  Administered 2020-01-07: 3 [IU] via SUBCUTANEOUS
  Administered 2020-01-07: 7 [IU] via SUBCUTANEOUS
  Administered 2020-01-08 – 2020-01-09 (×2): 3 [IU] via SUBCUTANEOUS
  Administered 2020-01-09: 2 [IU] via SUBCUTANEOUS
  Administered 2020-01-09: 07:00:00 3 [IU] via SUBCUTANEOUS

## 2020-01-07 MED ORDER — INSULIN ASPART 100 UNIT/ML ~~LOC~~ SOLN
3.0000 [IU] | Freq: Three times a day (TID) | SUBCUTANEOUS | Status: DC
  Administered 2020-01-07 – 2020-01-09 (×6): 3 [IU] via SUBCUTANEOUS

## 2020-01-07 MED ORDER — INSULIN ASPART 100 UNIT/ML ~~LOC~~ SOLN
0.0000 [IU] | Freq: Every day | SUBCUTANEOUS | Status: DC
  Administered 2020-01-07: 2 [IU] via SUBCUTANEOUS
  Administered 2020-01-08: 22:00:00 4 [IU] via SUBCUTANEOUS

## 2020-01-07 NOTE — Consult Note (Signed)
[facility] Consult HP  [name] Admit Date: 01/04/2020 01/07/2020 [name] Requesting Physician: Dr. Trula Pierce Reason for Consult:  Angio with contrast in setting of renal failure   HPI:  This is a 64 year old female with past medical history significant for diabetes type 2, HLD, OSA, obesity, chronic low back pain, diastolic CHF who presented to the ED with left foot pain x1 month.  Patient was treated with colchicine and prednisone on two different occasions with no improvement.  Patient presented to the ED with continuing severe pain that was concerning for subacute limb ischemia.  Patient was admitted for further management. Patient follows with Dr. Marval Pierce outpatient and appears to be very cautious about her kidney function. Patient denies any recent NSAIDs.   ROS Balance of 12 systems is negative w/ exceptions as above  PMH  Patient  has a past medical history of Achilles rupture, Anxiety, Arthritis, Asthma, Chronic back pain, Chronic diastolic heart failure ([place]) (11/30/2016), Chronic headaches, Chronic leg pain, CHRONIC OBSTRUCTIVE PULMONARY DISEASE, MODERATE (03/26/2010), Depression, Diabetes mellitus (1999), Glaucoma, Gout, H/O gestational diabetes mellitus, not currently pregnant, Hyperlipidemia, Hypertension, Morbid obesity ([place]), Obstructive sleep apnea on CPAP (07/02/2009), OSA (obstructive sleep apnea), and Sciatica.  PSH   has a past surgical history that includes Cholecystectomy (1991); Cesarean section (12/10/89); Tubal ligation; Back surgery; Cervical spine surgery; Lumbar spine surgery; RIGHT HEART CATH (N/A, 12/06/2016); and Breast excisional biopsy (Right).  FH  Family History  Problem Relation Age of Onset  . Heart failure Mother   . Renal Disease Mother    [name]  reports that she quit smoking about 21 years ago. Her smoking use included cigarettes. She has never used smokeless tobacco. She reports current alcohol use. She reports that she does not  use drugs.   Allergies  Allergies  Allergen Reactions  . Pork Allergy   . Pork-Derived Products Nausea And Vomiting    Medications:   Home Meds:  Current Outpatient Medications  Medication Instructions  . acetaminophen (TYLENOL) 500-1,000 mg, Oral, Every 6 hours PRN  . albuterol (PROVENTIL HFA;VENTOLIN HFA) 108 (90 BASE) MCG/ACT inhaler 2 puffs, Inhalation, Every 6 hours PRN  . ALPHAGAN P 0.15 % ophthalmic solution 1 drop, Both Eyes, 2 times daily  . aspirin EC 81 mg, Oral, Daily with breakfast  . atorvastatin (LIPITOR) 10 mg, Oral, Daily  . colchicine 0.6 mg, Oral, Daily  . dorzolamide-timolol (COSOPT) 22.3-6.8 MG/ML ophthalmic solution 1 drop, Both Eyes, 2 times daily  . doxazosin (CARDURA) 4 mg, Oral, Daily at bedtime  . HYDROcodone-acetaminophen (NORCO/VICODIN) 5-325 MG tablet 1 tablet, Oral, Every 6 hours PRN  . insulin glargine (LANTUS) 50 Units, Subcutaneous, Daily before breakfast  . leuprolide (LUPRON DEPOT (84-MONTH)) 11.25 mg, Intramuscular, Every 3 months  . liraglutide (VICTOZA) 1.8 mg, Subcutaneous, Daily  . lisinopril (ZESTRIL) 5 mg, Oral, Daily with breakfast  . metolazone (ZAROXOLYN) 2.5 mg, Oral, As directed, Must call CHF clinic before taking! ([phone]).  . NovoLOG FlexPen 3-20 Units, Subcutaneous, 3 times daily PRN  . prednisoLONE acetate (PRED FORTE) 1 % ophthalmic suspension 1 drop, Right Eye, 2 times daily  . predniSONE (STERAPRED UNI-PAK 21 TAB) 10 MG (21) TBPK tablet 6 tabs for 1 day, then 5 tabs for 1 das, then 4 tabs for 1 day, then 3 tabs for 1 day, 2 tabs for 1 day, then 1 tab for 1 day  . PRESCRIPTION MEDICATION Inhalation, Daily at bedtime, CPAP  . torsemide (DEMADEX) 80 mg, Oral, Daily  . torsemide (DEMADEX)  60 mg, Oral, As directed, Take 60 mg in the AM. You may take 60 mg in the PM as needed for swelling  . Travoprost, BAK Free, (TRAVATAN) 0.004 % SOLN ophthalmic solution 1 drop, Both Eyes, Daily at bedtime  . valACYclovir (VALTREX) 1,000 mg,  Oral, 3 times daily  . Vitamin D (Ergocalciferol) (DRISDOL) 50,000 Units, Oral, Every 7 days   Inpatient Meds: Scheduled Meds: . aspirin EC  81 mg Oral Q breakfast  . atorvastatin  40 mg Oral Daily  . brimonidine  1 drop Both Eyes BID  . colchicine  0.6 mg Oral Daily  . dorzolamide-timolol  1 drop Both Eyes BID  . doxazosin  4 mg Oral QHS  . insulin aspart  0-5 Units Subcutaneous QHS  . insulin aspart  0-9 Units Subcutaneous TID WC  . insulin aspart  3 Units Subcutaneous TID WC  . insulin glargine  40 Units Subcutaneous Q breakfast  . latanoprost  1 drop Both Eyes QHS  . prednisoLONE acetate  1 drop Right Eye BID  . valACYclovir  1,000 mg Oral BID   Continuous Infusions: . sodium chloride 100 mL/hr at 01/06/20 1815  . cefTRIAXone (ROCEPHIN)  IV 1 g (01/06/20 1840)  . heparin 1,000 Units/hr (01/05/20 2232)   PRN Meds:.acetaminophen **OR** acetaminophen, albuterol, HYDROmorphone (DILAUDID) injection, ondansetron **OR** ondansetron (ZOFRAN) IV, oxyCODONE-acetaminophen **OR** [DISCONTINUED]  morphine injection, polyethylene glycol Objective:   Physical Exam   BP (!) 158/75 (BP Location: Left Arm)   Pulse 60   Temp 98.6 F (37 C) (Oral)   Resp 18   Ht 5\' 1"  (1.549 m)   Wt 102.5 kg   LMP 02/16/2017 (Within Weeks) Comment: tubal ligation  SpO2 100%   BMI 42.70 kg/m  Filed Weights   01/04/20 2128  Weight: 102.5 kg    GEN: Well-appearing female, sitting up on side of bed, no acute distress EYES: Anicteric, noninjected CV: Regular rate and rhythm, no M/R/G PULM: No wheezing, rales appreciated.  Breathing comfortably on room air Left lower extremity: Unable to palate pate dorsal pedis pulse.  Tenderness to palpation.  No obvious rash or lesions generally or between digits.  Patient has 3/5 great toe extension and flexion, plantar/dorsiflexion as compared to 5/5 on right side.  Sensation intact. SKIN: No obvious infection, gangrene  Labs: CBC Recent Labs  Lab 01/05/20 1102  01/06/20 0345 01/07/20 0225  WBC 5.9 5.9 3.8*  NEUTROABS 3.5 3.3 2.6  HGB 11.3* 10.4* 10.1*  HCT 36.4 32.9* 31.6*  MCV 98.9 95.1 95.2  PLT 120* 112* [id number]*   Basic Metabolic Panel Recent Labs  Lab 01/04/20 2227 01/06/20 0345 01/07/20 0225  NA 137 135 135  K 4.4 4.7 5.0  CL 108 109 107  CO2 21* 19* 18*  GLUCOSE 189* 106* 271*  BUN 47* 50* 50*  CREATININE 2.02* 2.48* 2.18*  CALCIUM 8.9 8.4* 8.3*    Creat (mg/dL)  Date Value  01/22/2013 1.19 (H)   Creatinine, Ser (mg/dL)  Date Value  01/07/2020 2.18 (H)  01/06/2020 2.48 (H)  01/04/2020 2.02 (H)  04/09/2018 1.54 (H)  10/17/2017 2.13 (H)  10/09/2017 2.35 (H)  09/24/2017 2.59 (H)  07/06/2017 1.65 (H)  07/04/2017 1.65 (H)  07/02/2017 1.79 (H)   Pertinent Imaging:  No results found.   Assessment/ Plan:   Assessment This is a 64 year old female who presented with left lower leg pain x1 month concerning for critical limb ischemia.  Arterial duplex showed possible occlusion in proximal popliteal artery.  Vascular surgery  was consulted for further management.  Patient requiring angio and renal was consulted for recommendations in the setting of patient's CKD three.  Patient follows with Dr. Bing Ree.  On admission, patient's creatinine was 2.48 and now down to 2.18.  Her creatinines do seem labile for results in twenty eighteen and twenty nineteen.  Appears she has had kidney disease for about 4-1/2 years.  According to progress notes loaded into the system, patient's creatinine in November 2020 was 1.6, GFR 34.  Patient reports at more recent nephrology visits, she believes her creatinine is two-point something.  Her creatinine values overnight are reassuring.  When weighing risks and benefits, would recommend that patient obtain angio tomorrow, 01/08/2020.  Before procedure, would recommend continuing fluids and discontinuing any diuretics and other nephrotoxic medications. Patient reports that she has been on valacyclovir x a  few months for shingles of the eye.  Plan   Hold home lisinopril, torsemide  Consider decreasing colchicine to 0.3 mg   Continuous gentle fluids overnight  Zettie Cooley, MD [facility] Resident, PGY2 01/07/2020, 3:53 PM

## 2020-01-07 NOTE — Progress Notes (Signed)
Inpatient Diabetes Program Recommendations  AACE/ADA: New Consensus Statement on Inpatient Glycemic Control (2015)  Target Ranges:  Prepandial:   less than 140 mg/dL      Peak postprandial:   less than 180 mg/dL (1-2 hours)      Critically ill patients:  140 - 180 mg/dL   Lab Results  Component Value Date   GLUCAP 172 (H) 01/07/2020   HGBA1C 12.2 (H) 01/05/2020    Review of Glycemic Control Results for [name], [name] (MRN [id number]) as of 01/07/2020 10:27  Ref. Range 01/06/2020 09:01 01/06/2020 11:44 01/06/2020 16:40 01/06/2020 21:26 01/07/2020 08:11  Glucose-Capillary Latest Ref Range: 70 - 99 mg/dL 166 (H) 245 (H) 126 (H) 275 (H) 172 (H)  Diabetes history: DM 2 Outpatient Diabetes medications: Lantus 50 units q AM, Novolog 3-20 units tid with meals for blood sugars>150 mg/dL Current orders for Inpatient glycemic control:  Lantus 40 units q AM, Novolog moderate tid with meals and HS Inpatient Diabetes Program Recommendations:    Consider reducing Novolog correction to sensitive tid with meals.  Also consider adding Novolog meal coverage 3 units tid with meals (hold if patient eats less than 50%).   Thanks  Christina Perl, [name], [name] Inpatient Diabetes Coordinator Pager [phone] (8a-5p)

## 2020-01-07 NOTE — Progress Notes (Signed)
ANTICOAGULATION CONSULT NOTE - Follow Up Consult  Pharmacy Consult for Heparin Indication: R/o ischemic left foot   Patient Measurements: Height: 5\' 1"  (154.9 cm) Weight: 102.5 kg (226 lb) IBW/kg (Calculated) : 47.8 Heparin Dosing Weight: 72.6 kg   Vital Signs: Temp: 98.6 F (37 C) (04/21 0813) Temp Source: Oral (04/21 0813) BP: 158/75 (04/21 0813) Pulse Rate: 60 (04/21 0813)  Labs: Recent Labs    01/04/20 2227 01/04/20 2227 01/05/20 1102 01/05/20 2026 01/06/20 0345 01/06/20 1230 01/07/20 0225  HGB 12.3   < > 11.3*  --  10.4*  --  10.1*  HCT 38.3   < > 36.4  --  32.9*  --  31.6*  PLT 131*   < > 120*  --  112*  --  102*  HEPARINUNFRC  --   --   --    < > 0.67 0.35 0.44  CREATININE 2.02*  --   --   --  2.48*  --  2.18*   < > = values in this interval not displayed.    Estimated Creatinine Clearance: 29.1 mL/min (A) (by C-G formula based on SCr of 2.18 mg/dL (H)).  [name] with severe left foot pain concerning for subacute limb ischemia. Pharmacy consulted to start patient on IV heparin. Pt was on no anticoagulants PTA, per med rec.  Of note, patient has a documented allergy (N/V) to pork-derived product, but she seems to have tolerated heparin and Lovenox injections recently.  Heparin level is therapeutic at 0.44 on 1000 units/hr. No bleeding reported.   Goal of Therapy:  Heparin level 0.3-0.7 units/ml Monitor platelets by anticoagulation protocol: Yes   Plan:  Continue heparin gtt 1000 units/hr Daily heparin level and CBC  Salome Arnt, PharmD, BCPS Clinical Pharmacist Please see AMION for all pharmacy numbers 01/07/2020 9:08 AM

## 2020-01-07 NOTE — Progress Notes (Signed)
    Subjective  -   No overnight events   Physical Exam:  Non-palpable left pedalpulses Sensation intact to left leg abd soft       Assessment/Plan:    Creatinine decreased to 2.1 today. I am going to cancel her procedure and request the renal evaluate her.  She sees Dr Marval Regal as an outpatient.  I will defer to renal regarding the most appropriate time to proceed with angio.  Wells Kleber Crean 01/07/2020 8:26 AM --  Vitals:   01/06/20 2043 01/07/20 0813  BP: (!) 139/54 (!) 158/75  Pulse: 68 60  Resp: 14 18  Temp: 98.4 F (36.9 C) 98.6 F (37 C)  SpO2: 100% 100%    Intake/Output Summary (Last 24 hours) at 01/07/2020 0826 Last data filed at 01/07/2020 0400 Gross per 24 hour  Intake 1495.21 ml  Output 500 ml  Net 995.21 ml     Laboratory CBC    Component Value Date/Time   WBC 3.8 (L) 01/07/2020 0225   HGB 10.1 (L) 01/07/2020 0225   HCT 31.6 (L) 01/07/2020 0225   PLT 102 (L) 01/07/2020 0225    BMET    Component Value Date/Time   NA 135 01/07/2020 0225   NA 141 01/18/2017 1209   K 5.0 01/07/2020 0225   CL 107 01/07/2020 0225   CO2 18 (L) 01/07/2020 0225   GLUCOSE 271 (H) 01/07/2020 0225   BUN 50 (H) 01/07/2020 0225   BUN 32 (H) 01/18/2017 1209   CREATININE 2.18 (H) 01/07/2020 0225   CREATININE 1.19 (H) 01/22/2013 1949   CALCIUM 8.3 (L) 01/07/2020 0225   GFRNONAA 23 (L) 01/07/2020 0225   GFRAA 27 (L) 01/07/2020 0225    COAG Lab Results  Component Value Date   INR 0.96 11/30/2016   No results found for: PTT  Antibiotics Anti-infectives (From admission, onward)   Start     Dose/Rate Route Frequency Ordered Stop   01/06/20 1700  cefTRIAXone (ROCEPHIN) 1 g in sodium chloride 0.9 % 100 mL IVPB     1 g 200 mL/hr over 30 Minutes Intravenous Every 24 hours 01/06/20 1521     01/05/20 2345  valACYclovir (VALTREX) tablet 1,000 mg     1,000 mg Oral 2 times daily 01/05/20 2344         V. Leia Alf, M.D., [place] [facility] [place] Office: [phone] Pager:  [phone]

## 2020-01-07 NOTE — Progress Notes (Signed)
PROGRESS NOTE    [name]  [id number] DOB: 1956/05/25 DOA: 01/04/2020 PCP: Javier Docker, MD    Brief Narrative:  64 year old female with past medical history of chronic low back pain, chronic diastolic CHF, DM type 2, gout, HLD, HTN, OSA and obesity who presents with complaints of left foot pain. Patient explains that approximately 1 month ago she began to develop left foot pain. Patient's pain continued to worsen to the point where she presented to a local urgent care clinic twice several weeks ago and was managed for gout with prednisone twice. Patient then presented to her PCP approximately 1 week ago where she was placed on a course of colchicine 0.6 mg twice daily and told to take it for 14 days. Despite taking this, her pain continued to worsen. Patient states that her pain got to the point where she has been unable to ambulate around her home even when using an assistive device such as a walker. Upon evaluation in the ED, patient was found to have severe pain in the left foot, as well as cool to the touch with diminished pulses concerning for subacute limb ischemia. Case was discussed with Dr. Gloriajean Dell vascular surgery who recommended obtaining vascular arterial duplex as well as an ABI.  Patient admitted for further management.   Assessment & Plan:   Active Problems:   Uncontrolled type 2 diabetes mellitus with stage 3 chronic kidney disease, with long-term current use of insulin (HCC)   Essential hypertension   Chronic diastolic heart failure (HCC)   CKD (chronic kidney disease) stage 3, GFR 30-59 ml/min   Thrombocytopenia (HCC)   Acute foot pain, left   Lower limb ischemia   Subacute limb ischemia of LLE Peripheral vascular disease History as above ABI on the right 0.91, on the left is 0.44 Lower extremity arterial duplex showed possible occlusion in the proximal popliteal artery Vascular surgery consulted, Dr. Horris Latino spoke to Dr. Oneida Alar on 01/05/2020 as  well as Dr. Trula Slade on 01/06/2020 Start statins, aspirin Cont on heparin gtt Per vascular surgery, procedure cancelled today given Cr of 2.1.  Have consulted Nephrology for assitance  Diabetes mellitus type 2 Uncontrolled (patient had been on back-to-back prednisone due to possible gout, stated CBGs were in the 500s for a while) Repeat A1c 12.2 Continue with SSI, Lantus, Accu-Cheks, hypoglycemic protocol  CKD stage III No baseline to compare as last creatinine was in 2019, although appears to be around baseline Renal ultrasound pending S/p gentle hydration Strict I's and O's Continue to hold diuretic for now Repeat bmet in AM  Anemia of chronic kidney disease Unknown baseline Repeat bmet in AM  UTI Currently afebrile, with no leukocytosis UA showed positive nitrites, large leukocytes, many bacteria, greater than 50 WBC Urine culture with >100,000 ecoli species, pending sensitivities BC x2 NGTD  Hypomagnesemia Recently corrected  Thrombocytopenia Unknown etiology Slowly trending down Repeat bmet in AM  History of gout Uric acid 10.3 Continue colchicine as tolerated  Chronic diastolic HF Appears euvolemic Echo done in 2018 showed EF of 60 to 65%, no regional wall motion abnormality, grade 2 diastolic dysfunction Strict I's and O's, daily weights Home diuretics on hold for now  Hypertension Hold home lisinopril due to CKD  Morbid obesity Recommend diet/lifestyle modificatin  DVT prophylaxis: heparin gtt Code Status: Full Family Communication: Pt in room, family not at bedside  Status is: Inpatient  Remains inpatient appropriate because:Ongoing diagnostic testing needed not appropriate for outpatient work up   Dispo: The patient is  from: Home              Anticipated d/c is to: Home              Anticipated d/c date is: 3 days              Patient currently is not medically stable to d/c.        Consultants:   Vascular  Surgery  nephrology  Procedures:     Antimicrobials: Anti-infectives (From admission, onward)   Start     Dose/Rate Route Frequency Ordered Stop   01/06/20 1700  cefTRIAXone (ROCEPHIN) 1 g in sodium chloride 0.9 % 100 mL IVPB     1 g 200 mL/hr over 30 Minutes Intravenous Every 24 hours 01/06/20 1521     01/05/20 2345  valACYclovir (VALTREX) tablet 1,000 mg     1,000 mg Oral 2 times daily 01/05/20 2344         Subjective: In good spirits this AM. Denies sob or chest pains  Objective: Vitals:   01/06/20 0700 01/06/20 1642 01/06/20 2043 01/07/20 0813  BP: (!) 123/53 (!) 160/68 (!) 139/54 (!) 158/75  Pulse: 62 61 68 60  Resp: 18 20 14 18   Temp: 98.9 F (37.2 C) 98.6 F (37 C) 98.4 F (36.9 C) 98.6 F (37 C)  TempSrc: Oral Oral Oral Oral  SpO2: 100% 100% 100% 100%  Weight:      Height:        Intake/Output Summary (Last 24 hours) at 01/07/2020 1536 Last data filed at 01/07/2020 1400 Gross per 24 hour  Intake 1738.56 ml  Output 900 ml  Net 838.56 ml   Filed Weights   01/04/20 2128  Weight: 102.5 kg    Examination:  General exam: Appears calm and comfortable  Respiratory system: Clear to auscultation. Respiratory effort normal. Cardiovascular system: S1 & S2 heard, Regular Gastrointestinal system: Abdomen is nondistended, morbidly obese Central nervous system: Alert and oriented. No focal neurological deficits. Extremities: Symmetric 5 x 5 power. Skin: No rashes, lesions  Psychiatry: Judgement and insight appear normal. Mood & affect appropriate.   Data Reviewed: I have personally reviewed following labs and imaging studies  CBC: Recent Labs  Lab 01/04/20 2227 01/05/20 1102 01/06/20 0345 01/07/20 0225  WBC 5.8 5.9 5.9 3.8*  NEUTROABS 3.0 3.5 3.3 2.6  HGB 12.3 11.3* 10.4* 10.1*  HCT 38.3 36.4 32.9* 31.6*  MCV 95.3 98.9 95.1 95.2  PLT 131* 120* 112* [id number]*   Basic Metabolic Panel: Recent Labs  Lab 01/04/20 2227 01/05/20 1102 01/06/20 0345  01/07/20 0225  NA 137  --  135 135  K 4.4  --  4.7 5.0  CL 108  --  109 107  CO2 21*  --  19* 18*  GLUCOSE 189*  --  106* 271*  BUN 47*  --  50* 50*  CREATININE 2.02*  --  2.48* 2.18*  CALCIUM 8.9  --  8.4* 8.3*  MG  --  1.5* 2.0  --    GFR: Estimated Creatinine Clearance: 29.1 mL/min (A) (by C-G formula based on SCr of 2.18 mg/dL (H)). Liver Function Tests: Recent Labs  Lab 01/04/20 2227  AST 29  ALT 36  ALKPHOS 58  BILITOT 0.5  PROT 6.7  ALBUMIN 3.5   No results for input(s): LIPASE, AMYLASE in the last 168 hours. No results for input(s): AMMONIA in the last 168 hours. Coagulation Profile: No results for input(s): INR, PROTIME in the last 168 hours. Cardiac  Enzymes: No results for input(s): CKTOTAL, CKMB, CKMBINDEX, TROPONINI in the last 168 hours. BNP (last 3 results) No results for input(s): PROBNP in the last 8760 hours. HbA1C: Recent Labs    01/05/20 1102  HGBA1C 12.2*   CBG: Recent Labs  Lab 01/06/20 1144 01/06/20 1640 01/06/20 2126 01/07/20 0811 01/07/20 1203  GLUCAP 245* 126* 275* 172* 226*   Lipid Profile: Recent Labs    01/05/20 1102 01/06/20 0345  CHOL 172  --   HDL 71  --   LDLCALC NOT CALCULATED  --   TRIG 60  --   CHOLHDL 2.4  --   LDLDIRECT  --  65.7   Thyroid Function Tests: No results for input(s): TSH, T4TOTAL, FREET4, T3FREE, THYROIDAB in the last 72 hours. Anemia Panel: No results for input(s): VITAMINB12, FOLATE, FERRITIN, TIBC, IRON, RETICCTPCT in the last 72 hours. Sepsis Labs: Recent Labs  Lab 01/04/20 2227  LATICACIDVEN 1.0    Recent Results (from the past 240 hour(s))  Blood culture (routine x 2)     Status: None (Preliminary result)   Collection Time: 01/04/20 10:30 PM   Specimen: BLOOD  Result Value Ref Range Status   Specimen Description BLOOD LEFT ANTECUBITAL  Final   Special Requests   Final    BOTTLES DRAWN AEROBIC AND ANAEROBIC Blood Culture adequate volume   Culture   Final    NO GROWTH 3  DAYS Performed at [facility], [address] [place], [place] [address]    Report Status PENDING  Incomplete  Blood culture (routine x 2)     Status: None (Preliminary result)   Collection Time: 01/04/20 10:30 PM   Specimen: BLOOD  Result Value Ref Range Status   Specimen Description BLOOD RIGHT ANTECUBITAL  Final   Special Requests   Final    BOTTLES DRAWN AEROBIC AND ANAEROBIC Blood Culture results may not be optimal due to an excessive volume of blood received in culture bottles   Culture   Final    NO GROWTH 3 DAYS Performed at [facility], [place] [address], [place], [place] [address]    Report Status PENDING  Incomplete  SARS CORONAVIRUS 2 (TAT 6-24 HRS) Nasopharyngeal Nasopharyngeal Swab     Status: None   Collection Time: 01/05/20 12:57 AM   Specimen: Nasopharyngeal Swab  Result Value Ref Range Status   SARS Coronavirus 2 NEGATIVE NEGATIVE Final    Comment: (NOTE) SARS-CoV-2 target nucleic acids are NOT DETECTED. The SARS-CoV-2 RNA is generally detectable in upper and lower respiratory specimens during the acute phase of infection. Negative results do not preclude SARS-CoV-2 infection, do not rule out co-infections with other pathogens, and should not be used as the sole basis for treatment or other patient management decisions. Negative results must be combined with clinical observations, patient history, and epidemiological information. The expected result is Negative. Fact Sheet for Patients: SugarRoll.be Fact Sheet for Healthcare Providers: https://www.woods-mathews.com/ This test is not yet approved or cleared by the [place] FDA and  has been authorized for detection and/or diagnosis of SARS-CoV-2 by FDA under an Emergency Use Authorization (EUA). This EUA will remain  in effect (meaning this test can be used) for the duration of the COVID-19 declaration under Section 56 4(b)(1) of the Act, 21  U.S.C. section 360bbb-3(b)(1), unless the authorization is terminated or revoked sooner. Performed at [facility], [place] [address] [place], [place] [address]   Culture, Urine     Status: Abnormal (Preliminary result)  Collection Time: 01/06/20  7:48 AM   Specimen: Urine, Random  Result Value Ref Range Status   Specimen Description URINE, RANDOM  Final   Special Requests NONE  Final   Culture (A)  Final    >=100,000 COLONIES/mL ESCHERICHIA COLI SUSCEPTIBILITIES TO FOLLOW Performed at [facility], [address], [place], [place] [address]    Report Status PENDING  Incomplete     Radiology Studies: US RENAL  Result Date: 01/06/2020 CLINICAL DATA:  Chronic kidney disease EXAM: RENAL / URINARY TRACT ULTRASOUND COMPLETE COMPARISON:  None. FINDINGS: Right Kidney: Renal measurements: 9.3 x 4.3 x 4.6 cm = volume: 96 mL. Lobulated contour. No hydronephrosis. Normal echogenicity. Left Kidney: Renal measurements: 10.7 x 5.0 x 5.2 cm = volume: 145 mL. Lobulated contour without hydronephrosis. Normal echogenicity. Bladder: Decompressed. Other: Visualization is generally limited by body habitus. IMPRESSION: No hydronephrosis. Lobulated renal contours without focal parenchymal abnormality. Electronically Signed   By: Ulyses Jarred M.D.   On: 01/06/2020 16:16    Scheduled Meds: . aspirin EC  81 mg Oral Q breakfast  . atorvastatin  40 mg Oral Daily  . brimonidine  1 drop Both Eyes BID  . colchicine  0.6 mg Oral Daily  . dorzolamide-timolol  1 drop Both Eyes BID  . doxazosin  4 mg Oral QHS  . insulin aspart  0-5 Units Subcutaneous QHS  . insulin aspart  0-9 Units Subcutaneous TID WC  . insulin aspart  3 Units Subcutaneous TID WC  . insulin glargine  40 Units Subcutaneous Q breakfast  . latanoprost  1 drop Both Eyes QHS  . prednisoLONE acetate  1 drop Right Eye BID  . valACYclovir  1,000 mg Oral BID   Continuous Infusions: . sodium chloride 100 mL/hr at 01/06/20 1815  .  cefTRIAXone (ROCEPHIN)  IV 1 g (01/06/20 1840)  . heparin 1,000 Units/hr (01/05/20 2232)     LOS: 2 days   Marylu Lund, MD [facility] Pager On Amion  If 7PM-7AM, please contact night-coverage 01/07/2020, 3:36 PM

## 2020-01-08 ENCOUNTER — Encounter (HOSPITAL_COMMUNITY)
Admission: EM | Disposition: A | Discharge status: Home Health | Payer: Self-pay | Source: Home / Self Care | Attending: Internal Medicine

## 2020-01-08 HISTORY — PX: PERIPHERAL VASCULAR INTERVENTION: CATH118257

## 2020-01-08 HISTORY — PX: ABDOMINAL AORTOGRAM W/LOWER EXTREMITY: CATH118223

## 2020-01-08 LAB — BASIC METABOLIC PANEL
Anion gap: 7 (ref 5–15)
BUN: 40 mg/dL — ABNORMAL HIGH (ref 8–23)
CO2: 17 mmol/L — ABNORMAL LOW (ref 22–32)
Calcium: 8.2 mg/dL — ABNORMAL LOW (ref 8.9–10.3)
Chloride: 112 mmol/L — ABNORMAL HIGH (ref 98–111)
Creatinine, Ser: 1.92 mg/dL — ABNORMAL HIGH (ref 0.44–1.00)
GFR calc Af Amer: 32 mL/min — ABNORMAL LOW (ref >60)
GFR calc non Af Amer: 27 mL/min — ABNORMAL LOW (ref >60)
Glucose, Bld: 113 mg/dL — ABNORMAL HIGH (ref 70–99)
Potassium: 4.6 mmol/L (ref 3.5–5.1)
Sodium: 136 mmol/L (ref 135–145)

## 2020-01-08 LAB — GLUCOSE, CAPILLARY
Glucose-Capillary: 65 mg/dL — ABNORMAL LOW (ref 70–99)
Glucose-Capillary: 122 mg/dL — ABNORMAL HIGH (ref 70–99)
Glucose-Capillary: 84 mg/dL (ref 70–99)
Glucose-Capillary: 240 mg/dL — ABNORMAL HIGH (ref 70–99)
Glucose-Capillary: 323 mg/dL — ABNORMAL HIGH (ref 70–99)

## 2020-01-08 LAB — URINE CULTURE: Culture: >=100000 — AB

## 2020-01-08 LAB — CBC WITH DIFFERENTIAL/PLATELET
Abs Immature Granulocytes: 0.04 10*3/uL (ref 0.00–0.07)
Basophils Absolute: 0 10*3/uL (ref 0.0–0.1)
Basophils Relative: 1 %
Eosinophils Absolute: 0.1 10*3/uL (ref 0.0–0.5)
Eosinophils Relative: 2 %
HCT: 28.8 % — ABNORMAL LOW (ref 36.0–46.0)
Hemoglobin: 9.1 g/dL — ABNORMAL LOW (ref 12.0–15.0)
Immature Granulocytes: 1 %
Lymphocytes Relative: 21 %
Lymphs Abs: 0.7 10*3/uL (ref 0.7–4.0)
MCH: 30.3 pg (ref 26.0–34.0)
MCHC: 31.6 g/dL (ref 30.0–36.0)
MCV: 96 fL (ref 80.0–100.0)
Monocytes Absolute: 0.8 10*3/uL (ref 0.1–1.0)
Monocytes Relative: 22 %
Neutro Abs: 1.9 10*3/uL (ref 1.7–7.7)
Neutrophils Relative %: 53 %
Platelets: 93 10*3/uL — ABNORMAL LOW (ref 150–400)
RBC: 3 MIL/uL — ABNORMAL LOW (ref 3.87–5.11)
RDW: 17.5 % — ABNORMAL HIGH (ref 11.5–15.5)
WBC: 3.5 10*3/uL — ABNORMAL LOW (ref 4.0–10.5)
nRBC: 0 % (ref 0.0–0.2)

## 2020-01-08 LAB — HEPARIN LEVEL (UNFRACTIONATED): Heparin Unfractionated: 0.44 IU/mL (ref 0.30–0.70)

## 2020-01-08 SURGERY — ABDOMINAL AORTOGRAM W/LOWER EXTREMITY
Anesthesia: LOCAL | Laterality: Left

## 2020-01-08 MED ORDER — SODIUM CHLORIDE 0.9 % IV SOLN: 250.0000 mL | INTRAVENOUS | Status: DC | PRN

## 2020-01-08 MED ORDER — ONDANSETRON HCL 4 MG/2ML IJ SOLN
4.0000 mg | Freq: Four times a day (QID) | INTRAMUSCULAR | Status: DC | PRN
  Administered 2020-01-08 – 2020-01-09 (×4): 4 mg via INTRAVENOUS
  Filled 2020-01-08 (×4): qty 2

## 2020-01-08 MED ORDER — MIDAZOLAM HCL 2 MG/2ML IJ SOLN
INTRAMUSCULAR | Status: AC
  Filled 2020-01-08: qty 2

## 2020-01-08 MED ORDER — HEPARIN SODIUM (PORCINE) 1000 UNIT/ML IJ SOLN
INTRAMUSCULAR | Status: AC
  Filled 2020-01-08: qty 1

## 2020-01-08 MED ORDER — HEPARIN SODIUM (PORCINE) 1000 UNIT/ML IJ SOLN
INTRAMUSCULAR | Status: DC | PRN
  Administered 2020-01-08: 10000 [IU] via INTRAVENOUS

## 2020-01-08 MED ORDER — FENTANYL CITRATE (PF) 100 MCG/2ML IJ SOLN
INTRAMUSCULAR | Status: DC | PRN
  Administered 2020-01-08: 50 ug via INTRAVENOUS

## 2020-01-08 MED ORDER — CLOPIDOGREL BISULFATE 300 MG PO TABS
ORAL_TABLET | ORAL | Status: DC | PRN
  Administered 2020-01-08: 300 mg via ORAL

## 2020-01-08 MED ORDER — HYDRALAZINE HCL 20 MG/ML IJ SOLN
INTRAMUSCULAR | Status: DC | PRN
  Administered 2020-01-08: 10 mg via INTRAVENOUS

## 2020-01-08 MED ORDER — DEXTROSE 50 % IV SOLN
INTRAVENOUS | Status: AC
  Administered 2020-01-08: 25 mL
  Filled 2020-01-08: qty 50

## 2020-01-08 MED ORDER — LIDOCAINE HCL (PF) 1 % IJ SOLN
INTRAMUSCULAR | Status: DC | PRN
  Administered 2020-01-08: 15 mL via INTRADERMAL

## 2020-01-08 MED ORDER — CLOPIDOGREL BISULFATE 75 MG PO TABS: 300.0000 mg | ORAL_TABLET | Freq: Once | ORAL | Status: DC

## 2020-01-08 MED ORDER — SODIUM CHLORIDE 0.9% FLUSH: 3.0000 mL | INTRAVENOUS | Status: DC | PRN

## 2020-01-08 MED ORDER — CLOPIDOGREL BISULFATE 75 MG PO TABS: 75.0000 mg | ORAL_TABLET | Freq: Every day | ORAL | Status: DC

## 2020-01-08 MED ORDER — LACTATED RINGERS IV SOLN: INTRAVENOUS | Status: DC

## 2020-01-08 MED ORDER — HYDRALAZINE HCL 20 MG/ML IJ SOLN: 5.0000 mg | INTRAMUSCULAR | Status: DC | PRN

## 2020-01-08 MED ORDER — SODIUM CHLORIDE 0.9% FLUSH
3.0000 mL | Freq: Two times a day (BID) | INTRAVENOUS | Status: DC
  Administered 2020-01-09: 09:00:00 3 mL via INTRAVENOUS

## 2020-01-08 MED ORDER — ACETAMINOPHEN 325 MG PO TABS: 650.0000 mg | ORAL_TABLET | ORAL | Status: DC | PRN

## 2020-01-08 MED ORDER — FENTANYL CITRATE (PF) 100 MCG/2ML IJ SOLN
INTRAMUSCULAR | Status: AC
  Filled 2020-01-08: qty 2

## 2020-01-08 MED ORDER — MIDAZOLAM HCL 2 MG/2ML IJ SOLN
INTRAMUSCULAR | Status: DC | PRN
  Administered 2020-01-08 (×2): 1 mg via INTRAVENOUS

## 2020-01-08 MED ORDER — CLOPIDOGREL BISULFATE 300 MG PO TABS
ORAL_TABLET | ORAL | Status: AC
  Filled 2020-01-08: qty 1

## 2020-01-08 MED ORDER — HEPARIN (PORCINE) IN NACL 1000-0.9 UT/500ML-% IV SOLN
INTRAVENOUS | Status: DC | PRN
  Administered 2020-01-08 (×2): 500 mL

## 2020-01-08 MED ORDER — HYDRALAZINE HCL 20 MG/ML IJ SOLN
INTRAMUSCULAR | Status: AC
  Filled 2020-01-08: qty 1

## 2020-01-08 MED ORDER — CLOPIDOGREL BISULFATE 75 MG PO TABS
75.0000 mg | ORAL_TABLET | Freq: Every day | ORAL | Status: DC
  Administered 2020-01-09: 75 mg via ORAL
  Filled 2020-01-08: qty 1

## 2020-01-08 MED ORDER — LIDOCAINE HCL (PF) 1 % IJ SOLN
INTRAMUSCULAR | Status: AC
  Filled 2020-01-08: qty 30

## 2020-01-08 MED ORDER — LABETALOL HCL 5 MG/ML IV SOLN: 10.0000 mg | INTRAVENOUS | Status: DC | PRN

## 2020-01-08 MED ORDER — FENTANYL CITRATE (PF) 100 MCG/2ML IJ SOLN
INTRAMUSCULAR | Status: DC | PRN
  Administered 2020-01-08: 25 ug via INTRAVENOUS

## 2020-01-08 MED ORDER — HEPARIN (PORCINE) IN NACL 1000-0.9 UT/500ML-% IV SOLN
INTRAVENOUS | Status: AC
  Filled 2020-01-08: qty 1000

## 2020-01-08 MED ORDER — IODIXANOL 320 MG/ML IV SOLN
INTRAVENOUS | Status: DC | PRN
  Administered 2020-01-08: 45 mL via INTRA_ARTERIAL

## 2020-01-08 SURGICAL SUPPLY — 27 items
BALLN MUSTANG 5X60X135 (BALLOONS) ×3
BALLN STERLING OTW 3X40X150 (BALLOONS) ×3
BALLOON MUSTANG 5X60X135 (BALLOONS) ×2 IMPLANT
BALLOON STERLING OTW 3X40X150 (BALLOONS) ×2 IMPLANT
CATH OMNI FLUSH 5F 65CM (CATHETERS) ×3 IMPLANT
CATH SOFT-VU 4F 65 STRAIGHT (CATHETERS) ×2 IMPLANT
CATH SOFT-VU STRAIGHT 4F 65CM (CATHETERS) ×2
CLOSURE MYNX CONTROL 6F/7F (Vascular Products) ×3 IMPLANT
FILTER CO2 0.2 MICRON (VASCULAR PRODUCTS) ×3 IMPLANT
GLIDEWIRE ADV .035X260CM (WIRE) ×3 IMPLANT
GUIDEWIRE ANGLED .035X150CM (WIRE) ×3 IMPLANT
HOVERMATT SINGLE USE (MISCELLANEOUS) ×3 IMPLANT
KIT ENCORE 26 ADVANTAGE (KITS) ×3 IMPLANT
KIT MICROPUNCTURE NIT STIFF (SHEATH) ×3 IMPLANT
KIT PV (KITS) ×3 IMPLANT
RESERVOIR CO2 (VASCULAR PRODUCTS) ×3 IMPLANT
SET FLUSH CO2 (MISCELLANEOUS) ×3 IMPLANT
SHEATH FLEX ANSEL ST 6FR 45CM (SHEATH) ×3 IMPLANT
SHEATH PINNACLE 5F 10CM (SHEATH) ×3 IMPLANT
SHEATH PINNACLE 6F 10CM (SHEATH) ×3 IMPLANT
SHEATH PROBE COVER 6X72 (BAG) ×3 IMPLANT
STENT ELUVIA 6X60X130 (Permanent Stent) ×3 IMPLANT
SYR MEDRAD MARK 7 150ML (SYRINGE) ×3 IMPLANT
TRANSDUCER W/STOPCOCK (MISCELLANEOUS) ×3 IMPLANT
TRAY PV CATH (CUSTOM PROCEDURE TRAY) ×3 IMPLANT
WIRE BENTSON .035X145CM (WIRE) ×3 IMPLANT
WIRE G V18X300CM (WIRE) ×3 IMPLANT

## 2020-01-08 NOTE — Op Note (Signed)
Patient name: [name] MRN: [id number] DOB: December 01, 1955 Sex: female  01/08/2020 Pre-operative Diagnosis: Critical limb ischemia of the left lower extremity with tissue loss Post-operative diagnosis:  Same Surgeon:  Marty Heck, MD Procedure Performed: 1.  Ultrasound guided access of the right common femoral artery 2.  Aortogram with CO2 including catheter selection of the aorta 3.  Left lower extremity arteriogram with selection of third order branches 4.  Left distal SFA above-knee popliteal artery angioplasty with stent placement (primarily stented with a 6 mm x 60 mm drug-coated Eluvia postdilated with a 5 mm Mustang) 5.  Left anterior tibial angioplasty for focal perforation with resolution of the perforation (3 mm Sterling) 6.  Mynx closure of the right common femoral artery 7.  55 minutes of monitored moderate conscious sedation time   Indications: Patient is a 64 year old female who was seen in consultation by Dr. Trula Slade for left lower extremity rest pain with severely depressed ABIs and duplex showing likely popliteal disease.  She presents today for left lower extremity arteriogram after risk benefits discussed.  Findings:   Aortogram was performed with CO2 to limit contrast and there was no flow-limiting aortoiliac stenosis.  Left lower extremity arteriogram showed a patent common femoral profunda and widely patent SFA in the proximal to mid segment.  At [facility] canal in the distal SFA and above-knee popliteal artery showed a very short focal occlusion with large collateral around this.  She had a patent below-knee popliteal artery with three-vessel runoff.  Anterior tibial appeared to be dominant runoff.    Ultimately this lesion was crossed in the distal SFA/above knee popliteal artery and primarily stented with a 6 mm x 60 mm Eluvia.  She now has excellent inline flow down the left lower extremity with no residual stenosis and three-vessel runoff.  There was a  focal perforation in the anterior tibial where our wire likely made a perforation during exchange of balloons and stents.  Ultimately this was treated with a very low inflation of a 3 mm Sterling for 3 minutes with resolution.  Patient has preserved three vessel runoff.     Procedure:  The patient was identified in the holding area and taken to room 8.  The patient was then placed supine on the table and prepped and draped in the usual sterile fashion.  A time out was called.  Ultrasound was used to evaluate the right common femoral artery.  It was patent .  A digital ultrasound image was acquired.  A micropuncture needle was used to access the right common femoral artery under ultrasound guidance.  An 018 wire was advanced without resistance and a micropuncture sheath was placed.  The 018 wire was removed and a benson wire was placed.  The micropuncture sheath was exchanged for a 5 french sheath.  An omniflush catheter was advanced over the wire to the level of L-1.  An abdominal angiogram was obtained.  Next, using glidewire and straight 4 French flush catheter the aortic bifurcation was cross because the Omni would not track.  Placed the catheter into theleft external iliac artery and left runoff was obtained.  Ultimately after evaluating the pictures elected to intervene on the distal left SFA above-knee popliteal occlusion.  Used a long Glidewire advantage down the SFA and exchanged for a 6 [place] Ansell sheath in the right groin over the aortic bifurcation.  Patient was given 100 units per kilogram heparin.  I then primarily stented this lesion with a 6 mm x  60 mm Eluvia that was postdilated with a 5 mm x 60 mm Mustang.  Final pictures showed no residual stenosis with excellent inline flow down the left lower extremity and three-vessel runoff.  There was a focal perforation in the proximal anterior tibial that I think was from our wire.  I then exchanged for a V 18 wire down the anterior tibial and then did  a very slow inflation with a 3 mm Sterling in the left anterior tibial artery for 3 minutes.  There appeared to be resolution of the perforation after taking multiple additional pictures.  Wires and catheters removed and a short 6 French sheath was placed in the right groin.  We placed a mynx closure device.   Plan: Patient will be loaded on Plavix and will need to continue aspirin and Plavix.  Should have inline flow down left leg now.    Marty Heck, MD [facility] Office: [phone]

## 2020-01-08 NOTE — Progress Notes (Signed)
Inpatient Diabetes Program Recommendations  AACE/ADA: New Consensus Statement on Inpatient Glycemic Control (2015)  Target Ranges:  Prepandial:   less than 140 mg/dL      Peak postprandial:   less than 180 mg/dL (1-2 hours)      Critically ill patients:  140 - 180 mg/dL   Lab Results  Component Value Date   GLUCAP 122 (H) 01/08/2020   HGBA1C 12.2 (H) 01/05/2020    Review of Glycemic Control Results for [name], [name] (MRN [id number]) as of 01/08/2020 11:30  Ref. Range 01/07/2020 12:03 01/07/2020 16:12 01/07/2020 20:59 01/08/2020 07:17 01/08/2020 07:53  Glucose-Capillary Latest Ref Range: 70 - 99 mg/dL 226 (H) 318 (H) 203 (H) 65 (L) 122 (H)  Diabetes history:DM 2 Outpatient Diabetes medications:Lantus 50 units q AM, Novolog 3-20 units tid with meals for blood sugars>150 mg/dL Current orders for Inpatient glycemic control: Lantus 40 units q AM, Novolog sensitive tid with meals and HS, Novolog 3 units tid with meals  Inpatient Diabetes Program Recommendations:    Note fasting CBG=65 mg/dL. Consider reducing Lantus to 35 units q HS.  Also consider increasing Novolog meal coverage to 4 units to prevent post-prandial increases in blood sugars (hold if patient eats less than 50%).   Thanks  Adah Perl, RN, BC-ADM Inpatient Diabetes Coordinator Pager [phone] (8a-5p)

## 2020-01-08 NOTE — Progress Notes (Signed)
PROGRESS NOTE    [name]  [id number] DOB: 01/23/1956 DOA: 01/04/2020 PCP: Javier Docker, MD    Brief Narrative:  64 year old female with past medical history of chronic low back pain, chronic diastolic CHF, DM type 2, gout, HLD, HTN, OSA and obesity who presents with complaints of left foot pain. Patient explains that approximately 1 month ago she began to develop left foot pain. Patient's pain continued to worsen to the point where she presented to a local urgent care clinic twice several weeks ago and was managed for gout with prednisone twice. Patient then presented to her PCP approximately 1 week ago where she was placed on a course of colchicine 0.6 mg twice daily and told to take it for 14 days. Despite taking this, her pain continued to worsen. Patient states that her pain got to the point where she has been unable to ambulate around her home even when using an assistive device such as a walker. Upon evaluation in the ED, patient was found to have severe pain in the left foot, as well as cool to the touch with diminished pulses concerning for subacute limb ischemia. Case was discussed with Dr. Gloriajean Dell vascular surgery who recommended obtaining vascular arterial duplex as well as an ABI.  Patient admitted for further management.   Assessment & Plan:   Active Problems:   Uncontrolled type 2 diabetes mellitus with stage 3 chronic kidney disease, with long-term current use of insulin (HCC)   Essential hypertension   Chronic diastolic heart failure (HCC)   CKD (chronic kidney disease) stage 3, GFR 30-59 ml/min   Thrombocytopenia (HCC)   Acute foot pain, left   Lower limb ischemia  Subacute limb ischemia of LLE Peripheral vascular disease History as above ABI on the right 0.91, on the left is 0.44 Lower extremity arterial duplex showed possible occlusion in the proximal popliteal artery Vascular surgery consulted, Dr. Horris Latino spoke to Dr. Oneida Alar on 01/05/2020 as  well as Dr. Trula Slade on 01/06/2020 Start statins, aspirin Was initially continued on heparin gtt Pt now s/p aortogram with stent placed, restoring blood flow Vascular surgery recommends continued asa and plavix. Have stopped heparin  Diabetes mellitus type 2 Uncontrolled (patient had been on back-to-back prednisone due to possible gout, stated CBGs were in the 500s for a while) Repeat A1c 12.2 Will continue SSI, Lantus, Accu-Cheks, hypoglycemic protocol  CKD stage III No baseline to compare as last creatinine was in 2019, although appears to be around baseline Renal ultrasound reviewed, no hydronephrosis Given gentle hydration per Nephrology recs Strict I's and O's Continue to hold diuretic for now Recheck bmet in AM  Anemia of chronic kidney disease Unknown baseline Recheck bmet in AM  UTI Currently afebrile, with no leukocytosis UA showed positive nitrites, large leukocytes, many bacteria, greater than 50 WBC Urine culture with >100,000 ecoli species, ampicillin resistant BC x2 NGTD Continued on rocephin  Hypomagnesemia Recently corrected  Thrombocytopenia Unknown etiology Slowly trending down Recheck CBC in AM  History of gout Uric acid 10.3 Continue colchicine as tolerated  Chronic diastolic HF Appears euvolemic Echo done in 2018 showed EF of 60 to 65%, no regional wall motion abnormality, grade 2 diastolic dysfunction Strict I's and O's, daily weights Home diuretics had been on hold for now  Hypertension Hold home lisinopril due to CKD  Morbid obesity Recommend diet/lifestyle modificatin  DVT prophylaxis: heparin gtt Code Status: Full Family Communication: Pt in room, family not at bedside  Status is: Inpatient  Remains inpatient appropriate because:Ongoing  diagnostic testing needed not appropriate for outpatient work up   Dispo: The patient is from: Home              Anticipated d/c is to: Home              Anticipated d/c date is: 1 day               Patient currently is not medically stable to d/c.  Consultants:   Vascular Surgery  nephrology  Procedures:     Antimicrobials: Anti-infectives (From admission, onward)   Start     Dose/Rate Route Frequency Ordered Stop   01/06/20 1700  cefTRIAXone (ROCEPHIN) 1 g in sodium chloride 0.9 % 100 mL IVPB     1 g 200 mL/hr over 30 Minutes Intravenous Every 24 hours 01/06/20 1521     01/05/20 2345  valACYclovir (VALTREX) tablet 1,000 mg     1,000 mg Oral 2 times daily 01/05/20 2344        Subjective: Seen post-procedure, semi-sedate. Does complain of feeling cold  Objective: Vitals:   01/08/20 1300 01/08/20 1600 01/08/20 1615 01/08/20 1638  BP: (!) 156/62 (!) 173/53 (!) 183/63 (!) 165/48  Pulse:   72 73  Resp:  18 18 19   Temp:   98 F (36.7 C)   TempSrc:   Oral   SpO2: 98%  99% 99%  Weight:      Height:        Intake/Output Summary (Last 24 hours) at 01/08/2020 1735 Last data filed at 01/08/2020 1531 Gross per 24 hour  Intake 2949.65 ml  Output -  Net 2949.65 ml   Filed Weights   01/04/20 2128 01/08/20 1255  Weight: 102.5 kg 115.6 kg    Examination: General exam: Awake, laying in bed, in nad Respiratory system: Normal respiratory effort, no wheezing Cardiovascular system: regular rate, s1, s2 Gastrointestinal system: Soft, nondistended, positive BS Central nervous system: CN2-12 grossly intact, strength intact Extremities: Perfused, no clubbing Skin: Normal skin turgor, no notable skin lesions seen Psychiatry: Mood normal // no visual hallucinations   Data Reviewed: I have personally reviewed following labs and imaging studies  CBC: Recent Labs  Lab 01/04/20 2227 01/05/20 1102 01/06/20 0345 01/07/20 0225 01/08/20 0303  WBC 5.8 5.9 5.9 3.8* 3.5*  NEUTROABS 3.0 3.5 3.3 2.6 1.9  HGB 12.3 11.3* 10.4* 10.1* 9.1*  HCT 38.3 36.4 32.9* 31.6* 28.8*  MCV 95.3 98.9 95.1 95.2 96.0  PLT 131* 120* 112* 102* 93*   Basic Metabolic Panel: Recent  Labs  Lab 01/04/20 2227 01/05/20 1102 01/06/20 0345 01/07/20 0225 01/08/20 0303  NA 137  --  135 135 136  K 4.4  --  4.7 5.0 4.6  CL 108  --  109 107 112*  CO2 21*  --  19* 18* 17*  GLUCOSE 189*  --  106* 271* 113*  BUN 47*  --  50* 50* 40*  CREATININE 2.02*  --  2.48* 2.18* 1.92*  CALCIUM 8.9  --  8.4* 8.3* 8.2*  MG  --  1.5* 2.0  --   --    GFR: Estimated Creatinine Clearance: 35.5 mL/min (A) (by C-G formula based on SCr of 1.92 mg/dL (H)). Liver Function Tests: Recent Labs  Lab 01/04/20 2227  AST 29  ALT 36  ALKPHOS 58  BILITOT 0.5  PROT 6.7  ALBUMIN 3.5   No results for input(s): LIPASE, AMYLASE in the last 168 hours. No results for input(s): AMMONIA in the last 168  hours. Coagulation Profile: No results for input(s): INR, PROTIME in the last 168 hours. Cardiac Enzymes: No results for input(s): CKTOTAL, CKMB, CKMBINDEX, TROPONINI in the last 168 hours. BNP (last 3 results) No results for input(s): PROBNP in the last 8760 hours. HbA1C: No results for input(s): HGBA1C in the last 72 hours. CBG: Recent Labs  Lab 01/07/20 2059 01/08/20 0717 01/08/20 0753 01/08/20 1306 01/08/20 1637  GLUCAP 203* 65* 122* 84 240*   Lipid Profile: Recent Labs    01/06/20 0345  LDLDIRECT 65.7   Thyroid Function Tests: No results for input(s): TSH, T4TOTAL, FREET4, T3FREE, THYROIDAB in the last 72 hours. Anemia Panel: No results for input(s): VITAMINB12, FOLATE, FERRITIN, TIBC, IRON, RETICCTPCT in the last 72 hours. Sepsis Labs: Recent Labs  Lab 01/04/20 2227  LATICACIDVEN 1.0    Recent Results (from the past 240 hour(s))  Blood culture (routine x 2)     Status: None (Preliminary result)   Collection Time: 01/04/20 10:30 PM   Specimen: BLOOD  Result Value Ref Range Status   Specimen Description BLOOD LEFT ANTECUBITAL  Final   Special Requests   Final    BOTTLES DRAWN AEROBIC AND ANAEROBIC Blood Culture adequate volume   Culture   Final    NO GROWTH 4 DAYS  Performed at [facility], [place] [address], [place], [place] [address]    Report Status PENDING  Incomplete  Blood culture (routine x 2)     Status: None (Preliminary result)   Collection Time: 01/04/20 10:30 PM   Specimen: BLOOD  Result Value Ref Range Status   Specimen Description BLOOD RIGHT ANTECUBITAL  Final   Special Requests   Final    BOTTLES DRAWN AEROBIC AND ANAEROBIC Blood Culture results may not be optimal due to an excessive volume of blood received in culture bottles   Culture   Final    NO GROWTH 4 DAYS Performed at [facility], [place] [address], [place], [place] [address]    Report Status PENDING  Incomplete  SARS CORONAVIRUS 2 (TAT 6-24 HRS) Nasopharyngeal Nasopharyngeal Swab     Status: None   Collection Time: 01/05/20 12:57 AM   Specimen: Nasopharyngeal Swab  Result Value Ref Range Status   SARS Coronavirus 2 NEGATIVE NEGATIVE Final    Comment: (NOTE) SARS-CoV-2 target nucleic acids are NOT DETECTED. The SARS-CoV-2 RNA is generally detectable in upper and lower respiratory specimens during the acute phase of infection. Negative results do not preclude SARS-CoV-2 infection, do not rule out co-infections with other pathogens, and should not be used as the sole basis for treatment or other patient management decisions. Negative results must be combined with clinical observations, patient history, and epidemiological information. The expected result is Negative. Fact Sheet for Patients: SugarRoll.be Fact Sheet for Healthcare Providers: https://www.woods-mathews.com/ This test is not yet approved or cleared by the [place] FDA and  has been authorized for detection and/or diagnosis of SARS-CoV-2 by FDA under an Emergency Use Authorization (EUA). This EUA will remain  in effect (meaning this test can be used) for the duration of the COVID-19 declaration under Section 56 4(b)(1) of the Act, 21 U.S.C. section  360bbb-3(b)(1), unless the authorization is terminated or revoked sooner. Performed at [facility], [place] [address] [place], [place] [address]   Culture, Urine     Status: Abnormal   Collection Time: 01/06/20  7:48 AM   Specimen: Urine, Random  Result Value Ref Range Status   Specimen Description URINE, RANDOM  Final   Special Requests   Final    NONE Performed at [facility], [place] [address], [place], [place] [address]    Culture >=100,000 COLONIES/mL ESCHERICHIA COLI (A)  Final   Report Status 01/08/2020 FINAL  Final   Organism ID, Bacteria ESCHERICHIA COLI (A)  Final      Susceptibility   Escherichia coli - MIC*    AMPICILLIN >=32 RESISTANT Resistant     CEFAZOLIN <=4 SENSITIVE Sensitive     CEFTRIAXONE <=0.25 SENSITIVE Sensitive     CIPROFLOXACIN <=0.25 SENSITIVE Sensitive     GENTAMICIN <=1 SENSITIVE Sensitive     IMIPENEM <=0.25 SENSITIVE Sensitive     NITROFURANTOIN <=16 SENSITIVE Sensitive     TRIMETH/SULFA <=20 SENSITIVE Sensitive     AMPICILLIN/SULBACTAM >=32 RESISTANT Resistant     PIP/TAZO 8 SENSITIVE Sensitive     * >=100,000 COLONIES/mL ESCHERICHIA COLI     Radiology Studies: PERIPHERAL VASCULAR CATHETERIZATION  Result Date: 01/08/2020 Patient name: [name]   MRN: [id number]        DOB: 12-17-1955            Sex: female  01/08/2020 Pre-operative Diagnosis: Critical limb ischemia of the left lower extremity with tissue loss Post-operative diagnosis:  Same Surgeon:  Marty Heck, MD Procedure Performed: 1.  Ultrasound guided access of the right common femoral artery 2.  Aortogram with CO2 including catheter selection of the aorta 3.  Left lower extremity arteriogram with selection of third order branches 4.  Left distal SFA above-knee popliteal artery angioplasty with stent placement (primarily stented with a 6 mm x 60 mm drug-coated Eluvia postdilated with a 5 mm Mustang) 5.  Left anterior tibial angioplasty for focal perforation with resolution  of the perforation (3 mm Sterling) 6.  Mynx closure of the right common femoral artery 7.  55 minutes of monitored moderate conscious sedation time   Indications: Patient is a 64 year old female who was seen in consultation by Dr. Trula Slade for left lower extremity rest pain with severely depressed ABIs and duplex showing likely popliteal disease.  She presents today for left lower extremity arteriogram after risk benefits discussed.  Findings:  Aortogram was performed with CO2 to limit contrast and there was no flow-limiting aortoiliac stenosis.  Left lower extremity arteriogram showed a patent common femoral profunda and widely patent SFA in the proximal to mid segment.  At [facility] canal in the distal SFA and above-knee popliteal artery showed a very short focal occlusion with large collateral around this.  She had a patent below-knee popliteal artery with three-vessel runoff.  Anterior tibial appeared to be dominant runoff.   Ultimately this lesion was crossed in the distal SFA/above knee popliteal artery and primarily stented with a 6 mm x 60 mm Eluvia.  She now has excellent inline flow down the left lower extremity with no residual stenosis and three-vessel runoff.  There was a focal perforation in the anterior tibial where our wire likely made a perforation during exchange of balloons and stents.  Ultimately this was treated with a very low inflation of a 3 mm Sterling for 3 minutes with resolution.  Patient has preserved three vessel runoff.              Procedure:  The patient was identified in the holding area and taken to room 8.  The patient was then placed supine on the table and prepped and draped in the usual sterile fashion.  A time out was called.  Ultrasound  was used to evaluate the right common femoral artery.  It was patent .  A digital ultrasound image was acquired.  A micropuncture needle was used to access the right common femoral artery under ultrasound guidance.  An 018 wire was  advanced without resistance and a micropuncture sheath was placed.  The 018 wire was removed and a benson wire was placed.  The micropuncture sheath was exchanged for a 5 french sheath.  An omniflush catheter was advanced over the wire to the level of L-1.  An abdominal angiogram was obtained.  Next, using glidewire and straight 4 French flush catheter the aortic bifurcation was cross because the Omni would not track.  Placed the catheter into theleft external iliac artery and left runoff was obtained.  Ultimately after evaluating the pictures elected to intervene on the distal left SFA above-knee popliteal occlusion.  Used a long Glidewire advantage down the SFA and exchanged for a 6 [place] Ansell sheath in the right groin over the aortic bifurcation.  Patient was given 100 units per kilogram heparin.  I then primarily stented this lesion with a 6 mm x 60 mm Eluvia that was postdilated with a 5 mm x 60 mm Mustang.  Final pictures showed no residual stenosis with excellent inline flow down the left lower extremity and three-vessel runoff.  There was a focal perforation in the proximal anterior tibial that I think was from our wire.  I then exchanged for a V 18 wire down the anterior tibial and then did a very slow inflation with a 3 mm Sterling in the left anterior tibial artery for 3 minutes.  There appeared to be resolution of the perforation after taking multiple additional pictures.  Wires and catheters removed and a short 6 French sheath was placed in the right groin.  We placed a mynx closure device.   Plan: Patient will be loaded on Plavix and will need to continue aspirin and Plavix.  Should have inline flow down left leg now.   Marty Heck, MD [facility] Office: [phone]    Scheduled Meds: . aspirin EC  81 mg Oral Q breakfast  . atorvastatin  40 mg Oral Daily  . brimonidine  1 drop Both Eyes BID  . [START ON 01/09/2020] clopidogrel  75 mg Oral Daily   . colchicine  0.6 mg Oral Daily  . dorzolamide-timolol  1 drop Both Eyes BID  . doxazosin  4 mg Oral QHS  . insulin aspart  0-5 Units Subcutaneous QHS  . insulin aspart  0-9 Units Subcutaneous TID WC  . insulin aspart  3 Units Subcutaneous TID WC  . insulin glargine  40 Units Subcutaneous Q breakfast  . latanoprost  1 drop Both Eyes QHS  . prednisoLONE acetate  1 drop Right Eye BID  . sodium chloride flush  3 mL Intravenous Q12H  . valACYclovir  1,000 mg Oral BID   Continuous Infusions: . sodium chloride Stopped (01/08/20 1031)  . sodium chloride    . cefTRIAXone (ROCEPHIN)  IV 1 g (01/08/20 1714)  . lactated ringers 50 mL/hr at 01/08/20 1300     LOS: 3 days   Marylu Lund, MD [facility] Pager On Amion  If 7PM-7AM, please contact night-coverage 01/08/2020, 5:35 PM

## 2020-01-08 NOTE — Progress Notes (Signed)
  Day of Surgery Note    Subjective:  Says her left toes are no longer cold or painful   Vitals:   01/08/20 1247 01/08/20 1255  BP:  (!) 156/62  Pulse: (!) 0 70  Resp: (!) 0 18  Temp:  98.4 F (36.9 C)  SpO2: (!) 0% 100%    Extremities:  AROM both feet. Both feet warm with 2+ palp DP pulses Cardiac:  RRR Resp: nonlabored Right groin: soft. No bleeding or hematoma   Assessment/Plan:  This is a 64 y.o. female who is s/p right SFA stent and AT angioplasty via right femoral artery approach. Restoration of left pedal pulses. Maintain asa and Plavix   Risa Grill, PA-C 01/08/2020 3:26 PM [phone]

## 2020-01-08 NOTE — Plan of Care (Signed)
  Problem: Education: Goal: Knowledge of General Education information will improve Description Including pain rating scale, medication(s)/side effects and non-pharmacologic comfort measures Outcome: Progressing   Problem: Health Behavior/Discharge Planning: Goal: Ability to manage health-related needs will improve Outcome: Progressing   

## 2020-01-08 NOTE — Consult Note (Signed)
[facility] Progress Note    Assessment/ Plan:   Assessment 1. CKD III Patient's kidney function continues to improve with creatinine of 1.98 and BUN 40 this morning. Received gentle fluids overnight.  Patient scheduled for angio procedure at 10:30 AM today.  Plan  . Continue gentle LR after procedure  . AM Cr . Avoid nephrotoxic medications  Subjective:   Doing well this morning.  Only complaint is pain on her lower leg.  Looking forward to procedure today   Objective:   BP (!) 134/56   Pulse 67   Temp 99.2 F (37.3 C)   Resp 19   Ht 5\' 1"  (1.549 m)   Wt 102.5 kg   LMP 02/16/2017 (Within Weeks) Comment: tubal ligation  SpO2 100%   BMI 42.70 kg/m  Intake/Output      04/21 0701 - 04/22 0700 04/22 0701 - 04/23 0700   P.O. 500    I.V. (mL/kg)     IV Piggyback     Total Intake(mL/kg) 500 (4.9)    Urine (mL/kg/hr) 400 (0.2)    Total Output 400    Net +100         Urine Occurrence 2 x     Filed Weights   01/04/20 2128  Weight: 102.5 kg   Weight change:   Physical Exam: Gen: Well-appearing, no acute distress.  Lying comfortably in bed listening to music CVS: RRR Resp: CTA B Abd: Soft, nontender Ext: Unchanged from yesterday's exam  Imaging: US RENAL  Result Date: 01/06/2020 CLINICAL DATA:  Chronic kidney disease EXAM: RENAL / URINARY TRACT ULTRASOUND COMPLETE COMPARISON:  None. FINDINGS: Right Kidney: Renal measurements: 9.3 x 4.3 x 4.6 cm = volume: 96 mL. Lobulated contour. No hydronephrosis. Normal echogenicity. Left Kidney: Renal measurements: 10.7 x 5.0 x 5.2 cm = volume: 145 mL. Lobulated contour without hydronephrosis. Normal echogenicity. Bladder: Decompressed. Other: Visualization is generally limited by body habitus. IMPRESSION: No hydronephrosis. Lobulated renal contours without focal parenchymal abnormality. Electronically Signed   By: Ulyses Jarred M.D.   On: 01/06/2020 16:16   Labs: BMET Recent Labs  Lab 01/04/20 2227 01/06/20 0345  01/07/20 0225 01/08/20 0303  NA 137 135 135 136  K 4.4 4.7 5.0 4.6  CL 108 109 107 112*  CO2 21* 19* 18* 17*  GLUCOSE 189* 106* 271* 113*  BUN 47* 50* 50* 40*  CREATININE 2.02* 2.48* 2.18* 1.92*  CALCIUM 8.9 8.4* 8.3* 8.2*   CBC Recent Labs  Lab 01/05/20 1102 01/06/20 0345 01/07/20 0225 01/08/20 0303  WBC 5.9 5.9 3.8* 3.5*  NEUTROABS 3.5 3.3 2.6 1.9  HGB 11.3* 10.4* 10.1* 9.1*  HCT 36.4 32.9* 31.6* 28.8*  MCV 98.9 95.1 95.2 96.0  PLT 120* 112* 102* 93*    Medications:    . aspirin EC  81 mg Oral Q breakfast  . atorvastatin  40 mg Oral Daily  . brimonidine  1 drop Both Eyes BID  . colchicine  0.6 mg Oral Daily  . dorzolamide-timolol  1 drop Both Eyes BID  . doxazosin  4 mg Oral QHS  . insulin aspart  0-5 Units Subcutaneous QHS  . insulin aspart  0-9 Units Subcutaneous TID WC  . insulin aspart  3 Units Subcutaneous TID WC  . insulin glargine  40 Units Subcutaneous Q breakfast  . latanoprost  1 drop Both Eyes QHS  . prednisoLONE acetate  1 drop Right Eye BID  . valACYclovir  1,000 mg Oral BID    Zettie Cooley, MD  [facility] Resident, PGY2 01/08/2020, 8:17 AM

## 2020-01-08 NOTE — Progress Notes (Signed)
Hypoglycemic Event  CBG: 65  Treatment:D50 8ml  Symptoms: low CBG 65  Follow-up CBG: Time:0753 CBG Result:122  Possible Reasons for Event: HYPOGYCEMIA Comments/MD notified:Chiu Annie Main MD    Franchot Mimes Eswin Worrell

## 2020-01-08 NOTE — Progress Notes (Deleted)
ANTICOAGULATION CONSULT NOTE - Follow Up Consult  Pharmacy Consult for Heparin Indication: R/o ischemic left foot   Patient Measurements: Height: 5\' 1"  (154.9 cm) Weight: 102.5 kg (226 lb) IBW/kg (Calculated) : 47.8 Heparin Dosing Weight: 72.6 kg   Vital Signs: Temp: 99.2 F (37.3 C) (04/22 0734) Temp Source: Oral (04/22 0733) BP: 134/56 (04/22 0734) Pulse Rate: 67 (04/22 0734)  Labs: Recent Labs    01/06/20 0345 01/06/20 0345 01/06/20 1230 01/07/20 0225 01/08/20 0303  HGB 10.4*   < >  --  10.1* 9.1*  HCT 32.9*  --   --  31.6* 28.8*  PLT 112*  --   --  102* 93*  HEPARINUNFRC 0.67   < > 0.35 0.44 0.44  CREATININE 2.48*  --   --  2.18* 1.92*   < > = values in this interval not displayed.    Estimated Creatinine Clearance: 33 mL/min (A) (by C-G formula based on SCr of 1.92 mg/dL (H)).  [name] with severe left foot pain concerning for subacute limb ischemia. Pharmacy consulted to start patient on IV heparin. Pt was on no anticoagulants PTA, per med rec.  Of note, patient has a documented allergy (N/V) to pork-derived product, but she seems to have tolerated heparin and Lovenox injections recently.  Heparin level is therapeutic at 0.44 on 1000 units/hr. No bleeding reported.   Goal of Therapy:  Heparin level 0.3-0.7 units/ml Monitor platelets by anticoagulation protocol: Yes   Plan:  Continue heparin gtt 1000 units/hr Follow up post procedure today  Thank you Anette Guarneri, PharmD Please see AMION for all pharmacy numbers 01/08/2020 12:23 PM

## 2020-01-09 LAB — CULTURE, BLOOD (ROUTINE X 2)
Culture: NO GROWTH
Culture: NO GROWTH
Special Requests: ADEQUATE

## 2020-01-09 LAB — BASIC METABOLIC PANEL
Anion gap: 9 (ref 5–15)
BUN: 37 mg/dL — ABNORMAL HIGH (ref 8–23)
CO2: 16 mmol/L — ABNORMAL LOW (ref 22–32)
Calcium: 8.2 mg/dL — ABNORMAL LOW (ref 8.9–10.3)
Chloride: 110 mmol/L (ref 98–111)
Creatinine, Ser: 1.87 mg/dL — ABNORMAL HIGH (ref 0.44–1.00)
GFR calc Af Amer: 33 mL/min — ABNORMAL LOW (ref >60)
GFR calc non Af Amer: 28 mL/min — ABNORMAL LOW (ref >60)
Glucose, Bld: 267 mg/dL — ABNORMAL HIGH (ref 70–99)
Potassium: 4.8 mmol/L (ref 3.5–5.1)
Sodium: 135 mmol/L (ref 135–145)

## 2020-01-09 LAB — CBC
HCT: 30.8 % — ABNORMAL LOW (ref 36.0–46.0)
Hemoglobin: 9.7 g/dL — ABNORMAL LOW (ref 12.0–15.0)
MCH: 30.2 pg (ref 26.0–34.0)
MCHC: 31.5 g/dL (ref 30.0–36.0)
MCV: 96 fL (ref 80.0–100.0)
Platelets: 83 10*3/uL — ABNORMAL LOW (ref 150–400)
RBC: 3.21 MIL/uL — ABNORMAL LOW (ref 3.87–5.11)
RDW: 17.3 % — ABNORMAL HIGH (ref 11.5–15.5)
WBC: 4.2 10*3/uL (ref 4.0–10.5)
nRBC: 0 % (ref 0.0–0.2)

## 2020-01-09 LAB — GLUCOSE, CAPILLARY
Glucose-Capillary: 223 mg/dL — ABNORMAL HIGH (ref 70–99)
Glucose-Capillary: 193 mg/dL — ABNORMAL HIGH (ref 70–99)
Glucose-Capillary: 235 mg/dL — ABNORMAL HIGH (ref 70–99)

## 2020-01-09 MED ORDER — OXYCODONE-ACETAMINOPHEN 5-325 MG PO TABS: 1.0000 | ORAL_TABLET | Freq: Four times a day (QID) | ORAL | 0 refills | Status: AC | PRN

## 2020-01-09 MED ORDER — CLOPIDOGREL BISULFATE 75 MG PO TABS: 75.0000 mg | ORAL_TABLET | Freq: Every day | ORAL | 0 refills | Status: AC

## 2020-01-09 MED ORDER — ATORVASTATIN CALCIUM 40 MG PO TABS: 40.0000 mg | ORAL_TABLET | Freq: Every day | ORAL | 0 refills | Status: AC

## 2020-01-09 MED ORDER — INSULIN GLARGINE 100 UNIT/ML ~~LOC~~ SOLN
35.0000 [IU] | Freq: Every day | SUBCUTANEOUS | Status: DC
  Filled 2020-01-09: qty 0.35

## 2020-01-09 MED FILL — ATORVASTATIN CALCIUM 40 MG: 40 | 30 days supply | Qty: 30 | Fill #0

## 2020-01-09 MED FILL — OXYCODONE-APAP 5-325MG: 5-325 | 2 days supply | Qty: 5 | Fill #0

## 2020-01-09 MED FILL — CLOPIDOGREL 75 MG TABLET: 75 | 30 days supply | Qty: 30 | Fill #0

## 2020-01-09 NOTE — Consult Note (Addendum)
[facility] Progress Note    Assessment/ Plan:   Assessment 1. CKD III Patient doing well post-procedure. Creatinine continues to improve since admission, which is reassuring post-procedure. Patient does have noteable weight gain since admission, 102.5 kg to 115.6 kg yesterday (unlikely to be true). Patient received nearly 4L yesterday, trace pitting edema in lower extremities. Urine output noted, but not measured.  We will continue to monitor.  Patient overall doing well.  2. UTI >100k e coli resistance to ampicillin.   3. Limb ischemia, resolved Encouraged patient to walk around as much as possible.  It would be helpful for patient to get to PT when she is discharged, which Dr. Arita Miss recommends.   Plan  . D/c fluids  . PTOT . Resume home torsemide tomorrow 4/24 and trend cr.  . Follow up o/p with nephrologist regularly  . Avoid nephrotoxic medications  Subjective:   Doing well this morning.  Patient still reports that she is in pain.   Objective:   BP (!) 122/50 (BP Location: Left Arm)   Pulse 62   Temp 99.7 F (37.6 C) (Oral)   Resp 19   Ht 5\' 1"  (1.549 m)   Wt 115.6 kg   LMP 02/16/2017 (Within Weeks) Comment: tubal ligation  SpO2 100%   BMI 48.15 kg/m  Intake/Output      04/22 0701 - 04/23 0700 04/23 0701 - 04/24 0700   P.O. 240    I.V. (mL/kg) 3449.6 (29.8)    IV Piggyback 100.1    Total Intake(mL/kg) 3789.7 (32.8)    Urine (mL/kg/hr)     Total Output     Net +3789.7         Urine Occurrence 2 x    Stool Occurrence 1 x     Filed Weights   01/04/20 2128 01/08/20 1255  Weight: 102.5 kg 115.6 kg   Weight change:   Physical Exam: General: Well-appearing female, no acute distress Chest: Regular rate and rhythm, lungs clear to auscultation bilaterally Abdomen: Soft, nontender Lower extremities: Warm and well perfused, moving spontaneously.  Patient continues to have pain with palpation in the left distal extremity.  Also noted trace pitting  edema in lower extremities.  Imaging: PERIPHERAL VASCULAR CATHETERIZATION  Result Date: 01/08/2020 Patient name: [name]   MRN: [id number]        DOB: May 08, 1956            Sex: female  01/08/2020 Pre-operative Diagnosis: Critical limb ischemia of the left lower extremity with tissue loss Post-operative diagnosis:  Same Surgeon:  Marty Heck, MD Procedure Performed: 1.  Ultrasound guided access of the right common femoral artery 2.  Aortogram with CO2 including catheter selection of the aorta 3.  Left lower extremity arteriogram with selection of third order branches 4.  Left distal SFA above-knee popliteal artery angioplasty with stent placement (primarily stented with a 6 mm x 60 mm drug-coated Eluvia postdilated with a 5 mm Mustang) 5.  Left anterior tibial angioplasty for focal perforation with resolution of the perforation (3 mm Sterling) 6.  Mynx closure of the right common femoral artery 7.  55 minutes of monitored moderate conscious sedation time   Indications: Patient is a 64 year old female who was seen in consultation by Dr. Trula Slade for left lower extremity rest pain with severely depressed ABIs and duplex showing likely popliteal disease.  She presents today for left lower extremity arteriogram after risk benefits discussed.  Findings:  Aortogram was performed with CO2 to limit contrast  and there was no flow-limiting aortoiliac stenosis.  Left lower extremity arteriogram showed a patent common femoral profunda and widely patent SFA in the proximal to mid segment.  At [facility] canal in the distal SFA and above-knee popliteal artery showed a very short focal occlusion with large collateral around this.  She had a patent below-knee popliteal artery with three-vessel runoff.  Anterior tibial appeared to be dominant runoff.   Ultimately this lesion was crossed in the distal SFA/above knee popliteal artery and primarily stented with a 6 mm x 60 mm Eluvia.  She now has excellent inline  flow down the left lower extremity with no residual stenosis and three-vessel runoff.  There was a focal perforation in the anterior tibial where our wire likely made a perforation during exchange of balloons and stents.  Ultimately this was treated with a very low inflation of a 3 mm Sterling for 3 minutes with resolution.  Patient has preserved three vessel runoff.              Procedure:  The patient was identified in the holding area and taken to room 8.  The patient was then placed supine on the table and prepped and draped in the usual sterile fashion.  A time out was called.  Ultrasound was used to evaluate the right common femoral artery.  It was patent .  A digital ultrasound image was acquired.  A micropuncture needle was used to access the right common femoral artery under ultrasound guidance.  An 018 wire was advanced without resistance and a micropuncture sheath was placed.  The 018 wire was removed and a benson wire was placed.  The micropuncture sheath was exchanged for a 5 french sheath.  An omniflush catheter was advanced over the wire to the level of L-1.  An abdominal angiogram was obtained.  Next, using glidewire and straight 4 French flush catheter the aortic bifurcation was cross because the Omni would not track.  Placed the catheter into theleft external iliac artery and left runoff was obtained.  Ultimately after evaluating the pictures elected to intervene on the distal left SFA above-knee popliteal occlusion.  Used a long Glidewire advantage down the SFA and exchanged for a 6 [place] Ansell sheath in the right groin over the aortic bifurcation.  Patient was given 100 units per kilogram heparin.  I then primarily stented this lesion with a 6 mm x 60 mm Eluvia that was postdilated with a 5 mm x 60 mm Mustang.  Final pictures showed no residual stenosis with excellent inline flow down the left lower extremity and three-vessel runoff.  There was a focal perforation in the proximal anterior  tibial that I think was from our wire.  I then exchanged for a V 18 wire down the anterior tibial and then did a very slow inflation with a 3 mm Sterling in the left anterior tibial artery for 3 minutes.  There appeared to be resolution of the perforation after taking multiple additional pictures.  Wires and catheters removed and a short 6 French sheath was placed in the right groin.  We placed a mynx closure device.   Plan: Patient will be loaded on Plavix and will need to continue aspirin and Plavix.  Should have inline flow down left leg now.   Marty Heck, MD [facility] Office: [phone]   Labs: BMET Recent Labs  Lab 01/04/20 2227 01/06/20 0345 01/07/20 0225 01/08/20 0303 01/09/20 0308  NA 137 135 135 136  135  K 4.4 4.7 5.0 4.6 4.8  CL 108 109 107 112* 110  CO2 21* 19* 18* 17* 16*  GLUCOSE 189* 106* 271* 113* 267*  BUN 47* 50* 50* 40* 37*  CREATININE 2.02* 2.48* 2.18* 1.92* 1.87*  CALCIUM 8.9 8.4* 8.3* 8.2* 8.2*   CBC Recent Labs  Lab 01/05/20 1102 01/05/20 1102 01/06/20 0345 01/07/20 0225 01/08/20 0303 01/09/20 0308  WBC 5.9   < > 5.9 3.8* 3.5* 4.2  NEUTROABS 3.5  --  3.3 2.6 1.9  --   HGB 11.3*   < > 10.4* 10.1* 9.1* 9.7*  HCT 36.4   < > 32.9* 31.6* 28.8* 30.8*  MCV 98.9   < > 95.1 95.2 96.0 96.0  PLT 120*   < > 112* 102* 93* 83*   < > = values in this interval not displayed.    Medications:    . aspirin EC  81 mg Oral Q breakfast  . atorvastatin  40 mg Oral Daily  . brimonidine  1 drop Both Eyes BID  . clopidogrel  75 mg Oral Daily  . colchicine  0.6 mg Oral Daily  . dorzolamide-timolol  1 drop Both Eyes BID  . doxazosin  4 mg Oral QHS  . insulin aspart  0-5 Units Subcutaneous QHS  . insulin aspart  0-9 Units Subcutaneous TID WC  . insulin aspart  3 Units Subcutaneous TID WC  . insulin glargine  40 Units Subcutaneous Q breakfast  . latanoprost  1 drop Both Eyes QHS  . prednisoLONE acetate  1 drop Right Eye BID   . sodium chloride flush  3 mL Intravenous Q12H  . valACYclovir  1,000 mg Oral BID    Zettie Cooley, MD [facility] Family Medicine Resident, PGY2 01/09/2020, 7:56 AM

## 2020-01-09 NOTE — TOC Initial Note (Addendum)
Transition of Care (TOC) - Initial/Assessment Note  Marvetta Gibbons RN, BSN Transitions of Care Unit 4E- RN Case Manager [phone]   Patient Details  Name: [name] MRN: [id number] Date of Birth: 05/08/1956  Transition of Care [facility]) CM/SW Contact:    [name], RN Phone Number: 01/09/2020, 2:51 PM  Clinical Narrative:                 Pt admitted s/p aortogram with stenting, from home alone. Order placed for HHPT. CM spoke with pt at bedside regarding [place] needs, list provided to pt for choice Per CMS guidelines from medicare.gov website with star ratings (copy placed in shadow chart)- per pt she does not have a preference and gives permission for CM to find agency on her behalf. Discussed DME- pt reports she has walker and other DME at home, however she is requesting a wheelchair for home- PT is to see her again this afternoon and will assess her for wheelchair need.  Pt also stating she would like to find a new PCP- discussed how she can request a list from her insurance for in network providers, pt also has medicaid and would prefer to find a doctor that takes medicaid- CM to provide list of Medicaid providers in [employer].  Address, phone # and PCP all confirmed in epic.  Call made to [facility] with Alvis Lemmings for [place] referral- Alvis Lemmings has accepted the referral.  Will f/u with PT regarding wheelchair after they see pt.    Expected Discharge Plan: [place] Barriers to Discharge: Continued Medical Work up   Patient Goals and CMS Choice Patient states their goals for this hospitalization and ongoing recovery are:: To be able to get around [facility] Medicare.gov Compare Post Acute Care list provided to:: Patient Choice offered to / list presented to : Patient  Expected Discharge Plan and Services Expected Discharge Plan: [place]   Discharge Planning Services: CM Consult Post Acute Care Choice: Home Health, Durable Medical  Equipment Living arrangements for the past 2 months: Apartment                           HH Arranged: PT HH Agency: [place] Date [place]: 01/09/20 Time [place]: 1350 Representative spoke with at [place]: Tommi Rumps  Prior Living Arrangements/Services Living arrangements for the past 2 months: Apartment Lives with:: Self Patient language and need for interpreter reviewed:: Yes Do you feel safe going back to the place where you live?: Yes      Need for Family Participation in Patient Care: Yes (Comment) Care giver support system in place?: Yes (comment) Current home services: DME Criminal Activity/Legal Involvement Pertinent to Current Situation/Hospitalization: No - Comment as needed  Activities of Daily Living Home Assistive Devices/Equipment: Shower chair with back ADL Screening (condition at time of admission) Patient's cognitive ability adequate to safely complete daily activities?: Yes Is the patient deaf or have difficulty hearing?: No Does the patient have difficulty seeing, even when wearing glasses/contacts?: No Does the patient have difficulty concentrating, remembering, or making decisions?: No Patient able to express need for assistance with ADLs?: Yes Does the patient have difficulty dressing or bathing?: Yes Independently performs ADLs?: No Communication: Independent Dressing (OT): Needs assistance Is this a change from baseline?: Pre-admission baseline Feeding: Independent Bathing: Needs assistance Is this a change from baseline?: Pre-admission baseline In/Out Bed: Needs assistance Is this a change from  baseline?: Pre-admission baseline Walks in Home: Needs assistance Is this a change from baseline?: Pre-admission baseline Does the patient have difficulty walking or climbing stairs?: Yes Weakness of Legs: Both Weakness of Arms/Hands: Both  Permission Sought/Granted Permission sought to share information with : Ship broker granted to share information with : Yes, Verbal Permission Granted     Permission granted to share info w AGENCY: [place] agency        Emotional Assessment Appearance:: Appears stated age Attitude/Demeanor/Rapport: Engaged Affect (typically observed): Appropriate Orientation: : Oriented to Self, Oriented to Place, Oriented to  Time, Oriented to Situation Alcohol / Substance Use: Not Applicable Psych Involvement: No (comment)  Admission diagnosis:  Foot pain [M79.673] Lower limb ischemia [I99.8] Toe pain, left [M79.675] Acute foot pain, left [M79.672] Patient Active Problem List   Diagnosis Date Noted  . Spinal stenosis of lumbar region 01/05/2020  . Acute foot pain, left 01/05/2020  . Lower limb ischemia 01/05/2020  . Visit for routine gyn exam 07/15/2019  . STD exposure 07/15/2019  . Uterine fibroid 07/15/2019  . Hypertensive heart and renal disease   . Chronic diastolic heart failure ([place]) 11/30/2016  . CKD (chronic kidney disease) stage 3, GFR 30-59 ml/min 11/30/2016  . Thrombocytopenia ([place]) 11/30/2016  . Mixed incontinence 04/14/2015  . Asthma in adult without complication [id number]  . Primary open angle glaucoma of both eyes, severe stage 10/10/2014  . Arthritis of knee 01/05/2012  . PMB (postmenopausal bleeding) 05/09/2010  . ANEMIA 05/05/2010  . DEPRESSION 04/26/2010  . TMJ SYNDROME 04/26/2010  . DEGENERATIVE DISC DISEASE, CERVICAL SPINE 04/26/2010  . CHRONIC OBSTRUCTIVE PULMONARY DISEASE, MODERATE 03/26/2010  . Chronic obstructive pulmonary disease, unspecified ([place]) 03/26/2010  . MASTALGIA 10/08/2009  . Iron deficiency anemia 07/12/2009  . Uncontrolled type 2 diabetes mellitus with stage 3 chronic kidney disease, with long-term current use of insulin ([place]) 07/02/2009  . Hyperlipidemia 07/02/2009  . BMI 45.0-49.9, adult ([place]) 07/02/2009  . Obstructive sleep apnea on CPAP 07/02/2009  . Glaucoma 07/02/2009  . Essential  hypertension 07/02/2009  . ASTHMA 07/02/2009  . LOW BACK PAIN SYNDROME 07/02/2009  . BACK PAIN 07/02/2009  . PERIPHERAL EDEMA 07/02/2009  . TOBACCO USE, QUIT 07/02/2009  . HEMORRHOIDS, INTERNAL 07/16/2008  . DIVERTICULOSIS OF COLON 07/16/2008   PCP:  Javier Docker, MD Pharmacy:   [facility] [place], [place] - [address] [place] [address] Phone: [phone] Fax: [phone]     Social Determinants of Health (SDOH) Interventions    Readmission Risk Interventions No flowsheet data found.

## 2020-01-09 NOTE — TOC Transition Note (Signed)
Transition of Care [facility]) - CM/SW Discharge Note Marvetta Gibbons RN, BSN Transitions of Care Unit 4E- RN Case Manager [phone]   Patient Details  Name: [name] MRN: [id number] Date of Birth: Feb 14, 1956  Transition of Care [facility]) CM/SW Contact:  [name], RN Phone Number: 01/09/2020, 4:13 PM   Clinical Narrative:    Pt for transition home today- DME orders placed for w/c and tub bench- call made to [facility] with [employer] for DME needs- per [facility] w/c may have to be delivered to the home tomorrow- [employer] to call pt regarding this.  HH has been arranged and no further TOC needs noted.    Final next level of care: [place] Barriers to Discharge: Barriers Resolved   Patient Goals and CMS Choice Patient states their goals for this hospitalization and ongoing recovery are:: To be able to get around [facility] Medicare.gov Compare Post Acute Care list provided to:: Patient Choice offered to / list presented to : Patient  Discharge Placement               Home with [facility]        Discharge Plan and Services   Discharge Planning Services: CM Consult Post Acute Care Choice: Home Health, Durable Medical Equipment          DME Arranged: Wheelchair manual, Tub bench DME Agency: [employer] Date DME Agency Contacted: 01/09/20 Time DME Agency Contacted: [phone] Representative spoke with at DME Agency: [place]: PT [place]: [place] Date [place]: 01/09/20 Time [place]: 1350 Representative spoke with at [place]: [place] ([place]) Interventions     Readmission Risk Interventions Readmission Risk Prevention Plan 01/09/2020  Transportation Screening Complete  PCP or Specialist Appt within 3-5 Days Complete  HRI or [place] Complete  Social Work Consult for [place] Planning/Counseling [place] Not Applicable  Medication Review Press photographer)  Complete  Some recent data might be hidden

## 2020-01-09 NOTE — Progress Notes (Addendum)
  Progress Note    01/09/2020 7:20 AM 1 Day Post-Op  Subjective:  Still having some shooting pain into left toes but improving. Sensation of left toes improving   Vitals:   01/08/20 2316 01/09/20 0501  BP: (!) 152/54 (!) 122/50  Pulse: 87 62  Resp: 19 19  Temp: 99.8 F (37.7 C) 99.7 F (37.6 C)  SpO2: 96% 100%   Physical Exam: General: well appearing, well nourished, not in any distress Lungs:  Non labored Extremities:  Right femoral access site clean and dry, no swelling or hematoma. 2+ femoral pulses bilaterally, 2+ DP pulses bilaterally. Both feet warm. Motor and sensation intact Abdomen:  Obese, non tender Neurologic: Alert and oriented  CBC    Component Value Date/Time   WBC 4.2 01/09/2020 0308   RBC 3.21 (L) 01/09/2020 0308   HGB 9.7 (L) 01/09/2020 0308   HCT 30.8 (L) 01/09/2020 0308   PLT 83 (L) 01/09/2020 0308   MCV 96.0 01/09/2020 0308   MCH 30.2 01/09/2020 0308   MCHC 31.5 01/09/2020 0308   RDW 17.3 (H) 01/09/2020 0308   LYMPHSABS 0.7 01/08/2020 0303   MONOABS 0.8 01/08/2020 0303   EOSABS 0.1 01/08/2020 0303   BASOSABS 0.0 01/08/2020 0303    BMET    Component Value Date/Time   NA 135 01/09/2020 0308   NA 141 01/18/2017 1209   K 4.8 01/09/2020 0308   CL 110 01/09/2020 0308   CO2 16 (L) 01/09/2020 0308   GLUCOSE 267 (H) 01/09/2020 0308   BUN 37 (H) 01/09/2020 0308   BUN 32 (H) 01/18/2017 1209   CREATININE 1.87 (H) 01/09/2020 0308   CREATININE 1.19 (H) 01/22/2013 1949   CALCIUM 8.2 (L) 01/09/2020 0308   GFRNONAA 28 (L) 01/09/2020 0308   GFRAA 33 (L) 01/09/2020 0308    INR    Component Value Date/Time   INR 0.96 11/30/2016 1826     Intake/Output Summary (Last 24 hours) at 01/09/2020 0720 Last data filed at 01/09/2020 0600 Gross per 24 hour  Intake 3789.65 ml  Output --  Net 3789.65 ml     Assessment/Plan:  64 y.o. female is s/p  1. Ultrasound guided access of the right common femoral artery 2.  Aortogram with CO2 including catheter  selection of the aorta 3.  Left lower extremity arteriogram with selection of third order branches 4.  Left distal SFA above-knee popliteal artery angioplasty with stent placement (primarily stented with a 6 mm x 60 mm drug-coated Eluvia postdilated with a 5 mm Mustang) 5.  Left anterior tibial angioplasty for focal perforation with resolution of the perforation (3 mm Sterling) 6.  Mynx closure of the right common femoral artery 1 Day Post-Op   Bilateral lower extremities well perfused. Pain and sensation improving. Increase mobilization today     Marval Regal [facility] [phone] 01/09/2020 7:20 AM   I agree with the above.  I have seen and examined the patient.  She has a palpable left DP pulse.  Motor function is improved but not back to baseline.  Creatinine stable.  When she clears PT, she can be discharged from a vascular perspective.  I will not follow actively Discharge with Plavix, ASA 81mg  and statin I will schedule follow up in 1 month at my office  [place] Audris Speaker

## 2020-01-09 NOTE — Progress Notes (Signed)
   Durable Medical Equipment (From admission, onward)       Start     Ordered  01/09/20 1535   For home use only DME wheelchair cushion (seat and back)  Once    01/09/20 1534  01/09/20 1535   For home use only DME Tub bench  Once    01/09/20 1534  01/09/20 1534   For home use only DME standard manual wheelchair with seat cushion  Once   Comments: Patient suffers from diabetes and poor circulation to limb which impairs their ability to perform daily activities like bathing in the home.  A cane will not resolve issue with performing activities of daily living. A wheelchair will allow patient to safely perform daily activities. Patient can safely propel the wheelchair in the home or has a caregiver who can provide assistance. Length of need 12 months . Accessories: elevating leg rests (ELRs), wheel locks, extensions and anti-tippers.  01/09/20 1534

## 2020-01-09 NOTE — Discharge Instructions (Signed)
Restart Torsemide on 01/10/20 Recommend multivitamin and vitamin C

## 2020-01-09 NOTE — Discharge Summary (Addendum)
Physician Discharge Summary  [name] [id number] DOB: September 11, 1956 DOA: 01/04/2020  PCP: Javier Docker, MD  Admit date: 01/04/2020 Discharge date: 01/09/2020  Admitted From: Home Disposition:  Home  Recommendations for Outpatient Follow-up:  1. Follow up with PCP in 1-2 weeks 2. Follow up with Nephrology as scheduled 3. Follow up with Vascular Surgery as scheduled 4. Recommend repeat CBC within one week  Home Health:PT  Equipment/Devices:Wheelchair, tub bench    Discharge Condition:Improved CODE STATUS:Full Diet recommendation: Diabetic   Brief/Interim Summary: 64 year old female with past medical history of chronic low back pain, chronic diastolic CHF, DM type 2, gout, HLD, HTN, OSA and obesity who presents with complaints of left foot pain. Patient explains that approximately 1 month ago she began to develop left foot pain. Patient's pain continued to worsen to the point where she presented to a local urgent care clinic twice several weeks ago and was managed for gout with prednisone twice. Patient then presented to her PCP approximately 1 week ago where she was placed on a course of colchicine 0.6 mg twice daily and told to take it for 14 days. Despite taking this, her pain continued to worsen. Patient states that her pain got to the point where she has been unable to ambulate around her home even when using an assistive device such as a walker. Upon evaluation in the ED, patient was found to have severe pain in the left foot, as well as cool to the touch with diminished pulses concerning for subacute limb ischemia. Case was discussed with Dr. Gloriajean Dell vascular surgery who recommended obtaining vascular arterial duplex as well as an ABI. Patient admitted for further management.  Discharge Diagnoses:  Active Problems:   Uncontrolled type 2 diabetes mellitus with stage 3 chronic kidney disease, with long-term current use of insulin (HCC)   Essential hypertension    Chronic diastolic heart failure (HCC)   CKD (chronic kidney disease) stage 3, GFR 30-59 ml/min   Thrombocytopenia (HCC)   Acute foot pain, left   Lower limb ischemia  Subacute limb ischemia of LLE Peripheral vascular disease History as above ABI noted to be on the right 0.91, on the left is 0.44 Lower extremity arterial duplex showed possible occlusion in the proximal popliteal artery Vascular surgery consulted,Dr. Horris Latino spoke to Dr. Oneida Alar on 01/05/2020 as well as Dr. Trula Slade on 01/06/2020 Started statins, aspirin Was initially continued on heparin gtt Pt now s/p aortogram with stent placed, restoring blood flow Vascular surgery recommends continued asa and plavix. Have stopped heparin  Diabetes mellitus type 2 Uncontrolled(patient had been on back-to-back prednisone due to possible gout,stated CBGs were in the 500s for a while) RepeatA1c 12.2 Continued SSI, Lantus, Accu-Cheks, hypoglycemic protocol while in hospital  CKD stage III No baseline to compare as last creatinine was in 2019, although appears to be around baseline Renal ultrasound reviewed, no hydronephrosis Given gentle hydration per Nephrology recs, now stopped Strict I's and O's Continue to hold diuretic for now. Per Nephrology, OK to resume diuretic on 4/24 Pt to follow up with Nephrology closely  Anemia of chronic kidney disease Unknown baseline Remained hemodynamically stable  UTI Currently afebrile, with no leukocytosis UA showed positive nitrites, large leukocytes, many bacteria, greater than 50 WBC Urine culture with >100,000 ecoli species, ampicillin resistant BC x2 NGTD Completed 3 day course of rocephin  Hypomagnesemia Recently corrected  Thrombocytopenia Unknown etiology Slowly trending down Recheck CBC in AM  History of gout Uric acid 10.3 Continue colchicine as tolerated  Chronic diastolic  HF Appears euvolemic Echo done in 2018 showed EF of 60 to 65%, no regional wall motion  abnormality, grade 2 diastolic dysfunction Strict I's and O's, daily weights Home diuretics had been on hold, per Nephrology, to resume diuretic on 4/24  Hypertension Held home lisinoprildue to CKD  Morbid obesity Recommend diet/lifestyle modificatin  Thrombocytopenia Plts trending down to 85 No active bleeding Had been on heparin gtt. Discussed with pharmacy. Given timing, unlikely HIT Have discussed with Nephrology. Would follow as outpatient Recommend repeat CBC within one week   Discharge Instructions   Allergies as of 01/09/2020      Reactions   Pork Allergy    Pork-derived Products Nausea And Vomiting      Medication List    STOP taking these medications   HYDROcodone-acetaminophen 5-325 MG tablet Commonly known as: NORCO/VICODIN   leuprolide 11.25 MG injection Commonly known as: Lupron Depot (50-Month)   metolazone 2.5 MG tablet Commonly known as: ZAROXOLYN   predniSONE 10 MG (21) Tbpk tablet Commonly known as: STERAPRED UNI-PAK 21 TAB     TAKE these medications   acetaminophen 500 MG tablet Commonly known as: TYLENOL Take 500-1,000 mg by mouth every 6 (six) hours as needed for mild pain or headache.   albuterol 108 (90 Base) MCG/ACT inhaler Commonly known as: VENTOLIN HFA Inhale 2 puffs into the lungs every 6 (six) hours as needed for wheezing or shortness of breath.   Alphagan P 0.15 % ophthalmic solution Generic drug: brimonidine Place 1 drop into both eyes 2 (two) times daily.   aspirin EC 81 MG tablet Take 81 mg by mouth daily with breakfast.   atorvastatin 40 MG tablet Commonly known as: LIPITOR Take 1 tablet (40 mg total) by mouth daily. What changed:   medication strength  how much to take   clopidogrel 75 MG tablet Commonly known as: PLAVIX Take 1 tablet (75 mg total) by mouth daily. Start taking on: January 10, 2020   colchicine 0.6 MG tablet Take 0.6 mg by mouth daily.   dorzolamide-timolol 22.3-6.8 MG/ML ophthalmic  solution Commonly known as: COSOPT Place 1 drop into both eyes 2 (two) times daily.   doxazosin 4 MG tablet Commonly known as: CARDURA Take 4 mg by mouth at bedtime.   insulin glargine 100 unit/mL Sopn Commonly known as: LANTUS Inject 50 Units into the skin daily before breakfast.   liraglutide 18 MG/3ML Sopn Commonly known as: VICTOZA Inject 1.8 mg into the skin daily.   lisinopril 5 MG tablet Commonly known as: ZESTRIL Take 5 mg by mouth daily with breakfast.   NovoLOG FlexPen 100 UNIT/ML FlexPen Generic drug: insulin aspart Inject 3-20 Units into the skin 3 (three) times daily as needed for high blood sugar (CBG 150).   oxyCODONE-acetaminophen 5-325 MG tablet Commonly known as: PERCOCET/ROXICET Take 1-2 tablets by mouth every 6 (six) hours as needed for moderate pain.   prednisoLONE acetate 1 % ophthalmic suspension Commonly known as: PRED FORTE Place 1 drop into the right eye in the morning and at bedtime.   PRESCRIPTION MEDICATION Inhale into the lungs at bedtime. CPAP   torsemide 20 MG tablet Commonly known as: DEMADEX Take 3 tablets (60 mg total) by mouth as directed. Take 60 mg in the AM. You may take 60 mg in the PM as needed for swelling What changed:   how much to take  when to take this  additional instructions  Another medication with the same name was removed. Continue taking this medication, and follow  the directions you see here.   Travoprost (BAK Free) 0.004 % Soln ophthalmic solution Commonly known as: TRAVATAN Place 1 drop into both eyes at bedtime.   valACYclovir 1000 MG tablet Commonly known as: VALTREX Take 1,000 mg by mouth 3 (three) times daily.   Vitamin D (Ergocalciferol) 1.25 MG (50000 UNIT) Caps capsule Commonly known as: DRISDOL Take 1 capsule (50,000 Units total) by mouth every 7 (seven) days.            Durable Medical Equipment  (From admission, onward)         Start     Ordered   01/09/20 1535  For home use only  DME wheelchair cushion (seat and back)  Once     01/09/20 1534   01/09/20 1535  For home use only DME Tub bench  Once     01/09/20 1534   01/09/20 1534  For home use only DME standard manual wheelchair with seat cushion  Once    Comments: Patient suffers from diabetes and poor circulation to limb which impairs their ability to perform daily activities like bathing in the home.  A cane will not resolve issue with performing activities of daily living. A wheelchair will allow patient to safely perform daily activities. Patient can safely propel the wheelchair in the home or has a caregiver who can provide assistance. Length of need 12 months . Accessories: elevating leg rests (ELRs), wheel locks, extensions and anti-tippers.   01/09/20 1534         Follow-up Information    Donato Heinz, MD Follow up on 01/14/2020.   Specialty: Nephrology Why: As previously scheduled Contact information: [place] [address] [phone]        [facility] Follow up.   Specialty: Home Health Services Why: HHPT arranged- they will f/u with you post discharge within 48hr. to set up home visits Contact information: [place] [address] [phone]        [facility] Follow up.   Why: wheelchair and tub bench arranged- to be delivered to room prior to discharge Contact information: [place] [address] [phone]          Allergies  Allergen Reactions  . Pork Allergy   . Pork-Derived Products Nausea And Vomiting    Consultations:  Vascular Surgery  Nephrology  Procedures/Studies: US RENAL  Result Date: 01/06/2020 CLINICAL DATA:  Chronic kidney disease EXAM: RENAL / URINARY TRACT ULTRASOUND COMPLETE COMPARISON:  None. FINDINGS: Right Kidney: Renal measurements: 9.3 x 4.3 x 4.6 cm = volume: 96 mL. Lobulated contour. No hydronephrosis. Normal echogenicity. Left Kidney: Renal measurements: 10.7 x  5.0 x 5.2 cm = volume: 145 mL. Lobulated contour without hydronephrosis. Normal echogenicity. Bladder: Decompressed. Other: Visualization is generally limited by body habitus. IMPRESSION: No hydronephrosis. Lobulated renal contours without focal parenchymal abnormality. Electronically Signed   By: Ulyses Jarred M.D.   On: 01/06/2020 16:16   PERIPHERAL VASCULAR CATHETERIZATION  Result Date: 01/08/2020 Patient name: [name]   MRN: [id number]        DOB: 06/25/1956            Sex: female  01/08/2020 Pre-operative Diagnosis: Critical limb ischemia of the left lower extremity with tissue loss Post-operative diagnosis:  Same Surgeon:  Marty Heck, MD Procedure Performed: 1.  Ultrasound guided access of the right common femoral artery 2.  Aortogram with CO2 including catheter selection of the aorta 3.  Left  lower extremity arteriogram with selection of third order branches 4.  Left distal SFA above-knee popliteal artery angioplasty with stent placement (primarily stented with a 6 mm x 60 mm drug-coated Eluvia postdilated with a 5 mm Mustang) 5.  Left anterior tibial angioplasty for focal perforation with resolution of the perforation (3 mm Sterling) 6.  Mynx closure of the right common femoral artery 7.  55 minutes of monitored moderate conscious sedation time   Indications: Patient is a 64 year old female who was seen in consultation by Dr. Trula Slade for left lower extremity rest pain with severely depressed ABIs and duplex showing likely popliteal disease.  She presents today for left lower extremity arteriogram after risk benefits discussed.  Findings:  Aortogram was performed with CO2 to limit contrast and there was no flow-limiting aortoiliac stenosis.  Left lower extremity arteriogram showed a patent common femoral profunda and widely patent SFA in the proximal to mid segment.  At [place] canal in the distal SFA and above-knee popliteal artery showed a very short focal occlusion with large  collateral around this.  She had a patent below-knee popliteal artery with three-vessel runoff.  Anterior tibial appeared to be dominant runoff.   Ultimately this lesion was crossed in the distal SFA/above knee popliteal artery and primarily stented with a 6 mm x 60 mm Eluvia.  She now has excellent inline flow down the left lower extremity with no residual stenosis and three-vessel runoff.  There was a focal perforation in the anterior tibial where our wire likely made a perforation during exchange of balloons and stents.  Ultimately this was treated with a very low inflation of a 3 mm Sterling for 3 minutes with resolution.  Patient has preserved three vessel runoff.              Procedure:  The patient was identified in the holding area and taken to room 8.  The patient was then placed supine on the table and prepped and draped in the usual sterile fashion.  A time out was called.  Ultrasound was used to evaluate the right common femoral artery.  It was patent .  A digital ultrasound image was acquired.  A micropuncture needle was used to access the right common femoral artery under ultrasound guidance.  An 018 wire was advanced without resistance and a micropuncture sheath was placed.  The 018 wire was removed and a benson wire was placed.  The micropuncture sheath was exchanged for a 5 french sheath.  An omniflush catheter was advanced over the wire to the level of L-1.  An abdominal angiogram was obtained.  Next, using glidewire and straight 4 French flush catheter the aortic bifurcation was cross because the Omni would not track.  Placed the catheter into theleft external iliac artery and left runoff was obtained.  Ultimately after evaluating the pictures elected to intervene on the distal left SFA above-knee popliteal occlusion.  Used a long Glidewire advantage down the SFA and exchanged for a 6 [place] Ansell sheath in the right groin over the aortic bifurcation.  Patient was given 100 units per kilogram  heparin.  I then primarily stented this lesion with a 6 mm x 60 mm Eluvia that was postdilated with a 5 mm x 60 mm Mustang.  Final pictures showed no residual stenosis with excellent inline flow down the left lower extremity and three-vessel runoff.  There was a focal perforation in the proximal anterior tibial that I think was from our wire.  I then exchanged for  a V 18 wire down the anterior tibial and then did a very slow inflation with a 3 mm Sterling in the left anterior tibial artery for 3 minutes.  There appeared to be resolution of the perforation after taking multiple additional pictures.  Wires and catheters removed and a short 6 French sheath was placed in the right groin.  We placed a mynx closure device.   Plan: Patient will be loaded on Plavix and will need to continue aspirin and Plavix.  Should have inline flow down left leg now.   Marty Heck, MD [facility] Office: [phone]   DG Foot Complete Left  Result Date: 01/05/2020 CLINICAL DATA:  Swelling EXAM: LEFT FOOT - COMPLETE 3+ VIEW COMPARISON:  None. FINDINGS: Degenerative changes in the 1st MTP joint. No acute bony abnormality. Specifically, no fracture, subluxation, or dislocation. Vascular calcifications noted. No bone destruction seen to suggest osteomyelitis. IMPRESSION: No acute bony abnormality. Electronically Signed   By: Rolm Baptise M.D.   On: 01/05/2020 01:25   VAS [place] ABI WITH/WO TBI  Result Date: 01/05/2020 LOWER EXTREMITY DOPPLER STUDY Indications: Blue, cold toes. High Risk Factors: Hypertension, Diabetes.  Limitations: Today's exam was limited due to Patient body habitus. Comparison Study: No prior studies. Performing Technologist: Carlos Levering Rvt  Examination Guidelines: A complete evaluation includes at minimum, Doppler waveform signals and systolic blood pressure reading at the level of bilateral brachial, anterior tibial, and posterior tibial arteries, when vessel segments  are accessible. Bilateral testing is considered an integral part of a complete examination. Photoelectric Plethysmograph (PPG) waveforms and toe systolic pressure readings are included as required and additional duplex testing as needed. Limited examinations for reoccurring indications may be performed as noted.  ABI Findings: +---------+------------------+-----+---------+--------+ Right    Rt Pressure (mmHg)IndexWaveform Comment  +---------+------------------+-----+---------+--------+ Brachial 150                    triphasic         +---------+------------------+-----+---------+--------+ PTA      108               0.72 biphasic          +---------+------------------+-----+---------+--------+ DP       137               0.91 triphasic         +---------+------------------+-----+---------+--------+ Great Toe76                0.51                   +---------+------------------+-----+---------+--------+ +---------+------------------+-----+----------+-------+ Left     Lt Pressure (mmHg)IndexWaveform  Comment +---------+------------------+-----+----------+-------+ Brachial 136                    triphasic         +---------+------------------+-----+----------+-------+ PTA      66                0.44 monophasic        +---------+------------------+-----+----------+-------+ DP       53                0.35 monophasic        +---------+------------------+-----+----------+-------+ Great Toe0                 0.00                   +---------+------------------+-----+----------+-------+ +-------+-----------+-----------+------------+------------+ ABI/TBIToday's ABIToday's TBIPrevious ABIPrevious TBI +-------+-----------+-----------+------------+------------+ Right  0.91       0.51                                +-------+-----------+-----------+------------+------------+ Left   0.44       0                                    +-------+-----------+-----------+------------+------------+  Summary: Right: Resting right ankle-brachial index indicates mild right lower extremity arterial disease. The right toe-brachial index is abnormal. Left: Resting left ankle-brachial index indicates severe left lower extremity arterial disease. The left toe-brachial index is abnormal.  *See table(s) above for measurements and observations.  Electronically signed by [name] MD on 01/05/2020 at 7:35:40 PM.    Final    VAS [place] LOWER EXTREMITY ARTERIAL DUPLEX  Result Date: 01/05/2020 LOWER EXTREMITY ARTERIAL DUPLEX STUDY Indications: Blue, cold toes. High Risk Factors: Hypertension, Diabetes.  Current ABI: R 0.91 L 0.44 Limitations: Patient body habitus, poor ultrasound/tissue interface. Comparison Study: No prior studies. Performing Technologist: Oliver Hum RVT  Examination Guidelines: A complete evaluation includes B-mode imaging, spectral Doppler, color Doppler, and power Doppler as needed of all accessible portions of each vessel. Bilateral testing is considered an integral part of a complete examination. Limited examinations for reoccurring indications may be performed as noted.  +-----------+--------+-----+--------+----------+------------------+ LEFT       PSV cm/sRatioStenosisWaveform  Comments           +-----------+--------+-----+--------+----------+------------------+ EIA Distal 116                  biphasic                     +-----------+--------+-----+--------+----------+------------------+ CFA Mid    132                  biphasic                     +-----------+--------+-----+--------+----------+------------------+ DFA        111                  biphasic                     +-----------+--------+-----+--------+----------+------------------+ SFA Prox   104                  biphasic                     +-----------+--------+-----+--------+----------+------------------+ SFA Mid    85                    biphasic                     +-----------+--------+-----+--------+----------+------------------+ SFA Distal 91                   biphasic                     +-----------+--------+-----+--------+----------+------------------+ POP Prox   26                   monophasic                   +-----------+--------+-----+--------+----------+------------------+ POP Distal 0  Unable to insonate +-----------+--------+-----+--------+----------+------------------+ ATA Prox   73                   biphasic                     +-----------+--------+-----+--------+----------+------------------+ ATA Mid    33                   monophasic                   +-----------+--------+-----+--------+----------+------------------+ ATA Distal 32                   monophasic                   +-----------+--------+-----+--------+----------+------------------+ PTA Prox                                  Unable to insonate +-----------+--------+-----+--------+----------+------------------+ PTA Mid    27                   monophasic                   +-----------+--------+-----+--------+----------+------------------+ PTA Distal 9                    monophasic                   +-----------+--------+-----+--------+----------+------------------+ PERO Prox                                 Unable to insonate +-----------+--------+-----+--------+----------+------------------+ PERO Mid                                  Unable to insonate +-----------+--------+-----+--------+----------+------------------+ PERO Distal15                   monophasic                   +-----------+--------+-----+--------+----------+------------------+ DP         29                   monophasic                   +-----------+--------+-----+--------+----------+------------------+  Summary: Left: The external iliac, common femoral, and superficial femoral  arteries appear patent with no significant stenosis. Monophasic waveforms are noted in the proximal popliteal artery with poor visualization of the distal segment. Which suggests a possible occlusion. Anterior tibial artery appears patent throughout with monophasic flow. The posterior tibial, and peroneal arteries demonstrated monophasic flow with likely areas of occlusive disease. The proximal calf arteries were difficult to visualize.  See table(s) above for measurements and observations. Electronically signed by [name] MD on 01/05/2020 at 7:34:41 PM.    Final      Subjective: Eager to go home  Discharge Exam: Vitals:   01/09/20 0805 01/09/20 0846  BP: (!) 143/57 (!) 137/56  Pulse: 68 66  Resp: 18 18  Temp: 98.5 F (36.9 C)   SpO2: 100% 100%   Vitals:   01/08/20 2316 01/09/20 0501 01/09/20 0805 01/09/20 0846  BP: (!) 152/54 (!) 122/50 (!) 143/57 (!) 137/56  Pulse: 87 62 68 66  Resp: 19 19 18  18  Temp: 99.8 F (37.7 C) 99.7 F (37.6 C) 98.5 F (36.9 C)   TempSrc: Oral Oral Axillary   SpO2: 96% 100% 100% 100%  Weight:      Height:        General: Pt is alert, awake, not in acute distress Cardiovascular: RRR, S1/S2 +, no rubs, no gallops Respiratory: CTA bilaterally, no wheezing, no rhonchi Abdominal: Soft, NT, ND, bowel sounds + Extremities: no edema, no cyanosis   The results of significant diagnostics from this hospitalization (including imaging, microbiology, ancillary and laboratory) are listed below for reference.     Microbiology: Recent Results (from the past 240 hour(s))  Blood culture (routine x 2)     Status: None   Collection Time: 01/04/20 10:30 PM   Specimen: BLOOD  Result Value Ref Range Status   Specimen Description BLOOD LEFT ANTECUBITAL  Final   Special Requests   Final    BOTTLES DRAWN AEROBIC AND ANAEROBIC Blood Culture adequate volume   Culture   Final    NO GROWTH 5 DAYS Performed at [facility], [address] [place],  [place] [address]    Report Status 01/09/2020 FINAL  Final  Blood culture (routine x 2)     Status: None   Collection Time: 01/04/20 10:30 PM   Specimen: BLOOD  Result Value Ref Range Status   Specimen Description BLOOD RIGHT ANTECUBITAL  Final   Special Requests   Final    BOTTLES DRAWN AEROBIC AND ANAEROBIC Blood Culture results may not be optimal due to an excessive volume of blood received in culture bottles   Culture   Final    NO GROWTH 5 DAYS Performed at [facility], [place] [address] [place], [place] [address]    Report Status 01/09/2020 FINAL  Final  SARS CORONAVIRUS 2 (TAT 6-24 HRS) Nasopharyngeal Nasopharyngeal Swab     Status: None   Collection Time: 01/05/20 12:57 AM   Specimen: Nasopharyngeal Swab  Result Value Ref Range Status   SARS Coronavirus 2 NEGATIVE NEGATIVE Final    Comment: (NOTE) SARS-CoV-2 target nucleic acids are NOT DETECTED. The SARS-CoV-2 RNA is generally detectable in upper and lower respiratory specimens during the acute phase of infection. Negative results do not preclude SARS-CoV-2 infection, do not rule out co-infections with other pathogens, and should not be used as the sole basis for treatment or other patient management decisions. Negative results must be combined with clinical observations, patient history, and epidemiological information. The expected result is Negative. Fact Sheet for Patients: SugarRoll.be Fact Sheet for Healthcare Providers: https://www.woods-mathews.com/ This test is not yet approved or cleared by the [place] FDA and  has been authorized for detection and/or diagnosis of SARS-CoV-2 by FDA under an Emergency Use Authorization (EUA). This EUA will remain  in effect (meaning this test can be used) for the duration of the COVID-19 declaration under Section 56 4(b)(1) of the Act, 21 U.S.C. section 360bbb-3(b)(1), unless the authorization is terminated or revoked  sooner. Performed at [facility], [place] [address], [place], [place] [address]   Culture, Urine     Status: Abnormal   Collection Time: 01/06/20  7:48 AM   Specimen: Urine, Random  Result Value Ref Range Status   Specimen Description URINE, RANDOM  Final   Special Requests   Final    NONE Performed at [facility], [place] [address] [place], [place] [address]    Culture >=100,000 COLONIES/mL ESCHERICHIA COLI (A)  Final  Report Status 01/08/2020 FINAL  Final   Organism ID, Bacteria ESCHERICHIA COLI (A)  Final      Susceptibility   Escherichia coli - MIC*    AMPICILLIN >=32 RESISTANT Resistant     CEFAZOLIN <=4 SENSITIVE Sensitive     CEFTRIAXONE <=0.25 SENSITIVE Sensitive     CIPROFLOXACIN <=0.25 SENSITIVE Sensitive     GENTAMICIN <=1 SENSITIVE Sensitive     IMIPENEM <=0.25 SENSITIVE Sensitive     NITROFURANTOIN <=16 SENSITIVE Sensitive     TRIMETH/SULFA <=20 SENSITIVE Sensitive     AMPICILLIN/SULBACTAM >=32 RESISTANT Resistant     PIP/TAZO 8 SENSITIVE Sensitive     * >=100,000 COLONIES/mL ESCHERICHIA COLI     Labs: BNP (last 3 results) No results for input(s): BNP in the last 8760 hours. Basic Metabolic Panel: Recent Labs  Lab 01/04/20 2227 01/05/20 1102 01/06/20 0345 01/07/20 0225 01/08/20 0303 01/09/20 0308  NA 137  --  135 135 136 135  K 4.4  --  4.7 5.0 4.6 4.8  CL 108  --  109 107 112* 110  CO2 21*  --  19* 18* 17* 16*  GLUCOSE 189*  --  106* 271* 113* 267*  BUN 47*  --  50* 50* 40* 37*  CREATININE 2.02*  --  2.48* 2.18* 1.92* 1.87*  CALCIUM 8.9  --  8.4* 8.3* 8.2* 8.2*  MG  --  1.5* 2.0  --   --   --    Liver Function Tests: Recent Labs  Lab 01/04/20 2227  AST 29  ALT 36  ALKPHOS 58  BILITOT 0.5  PROT 6.7  ALBUMIN 3.5   No results for input(s): LIPASE, AMYLASE in the last 168 hours. No results for input(s): AMMONIA in the last 168 hours. CBC: Recent Labs  Lab 01/04/20 2227 01/04/20 2227 01/05/20 1102 01/06/20 0345 01/07/20 0225  01/08/20 0303 01/09/20 0308  WBC 5.8   < > 5.9 5.9 3.8* 3.5* 4.2  NEUTROABS 3.0  --  3.5 3.3 2.6 1.9  --   HGB 12.3   < > 11.3* 10.4* 10.1* 9.1* 9.7*  HCT 38.3   < > 36.4 32.9* 31.6* 28.8* 30.8*  MCV 95.3   < > 98.9 95.1 95.2 96.0 96.0  PLT 131*   < > 120* 112* 102* 93* 83*   < > = values in this interval not displayed.   Cardiac Enzymes: No results for input(s): CKTOTAL, CKMB, CKMBINDEX, TROPONINI in the last 168 hours. BNP: Invalid input(s): POCBNP CBG: Recent Labs  Lab 01/08/20 1306 01/08/20 1637 01/08/20 2113 01/09/20 0605 01/09/20 1158  GLUCAP 84 240* 323* 223* 193*   D-Dimer No results for input(s): DDIMER in the last 72 hours. Hgb A1c No results for input(s): HGBA1C in the last 72 hours. Lipid Profile No results for input(s): CHOL, HDL, LDLCALC, TRIG, CHOLHDL, LDLDIRECT in the last 72 hours. Thyroid function studies No results for input(s): TSH, T4TOTAL, T3FREE, THYROIDAB in the last 72 hours.  Invalid input(s): FREET3 Anemia work up No results for input(s): VITAMINB12, FOLATE, FERRITIN, TIBC, IRON, RETICCTPCT in the last 72 hours. Urinalysis    Component Value Date/Time   COLORURINE YELLOW 01/06/2020 1400   APPEARANCEUR CLOUDY (A) 01/06/2020 1400   LABSPEC 1.014 01/06/2020 1400   PHURINE 5.0 01/06/2020 1400   GLUCOSEU NEGATIVE 01/06/2020 1400   HGBUR NEGATIVE 01/06/2020 1400   HGBUR negative 08/20/2009 0807   BILIRUBINUR NEGATIVE 01/06/2020 1400   BILIRUBINUR neg 05/14/2013 [place] 01/06/2020 1400   PROTEINUR  NEGATIVE 01/06/2020 1400   UROBILINOGEN 1.0 11/11/2014 1215   NITRITE POSITIVE (A) 01/06/2020 1400   LEUKOCYTESUR LARGE (A) 01/06/2020 1400   Sepsis Labs Invalid input(s): PROCALCITONIN,  WBC,  LACTICIDVEN Microbiology Recent Results (from the past 240 hour(s))  Blood culture (routine x 2)     Status: None   Collection Time: 01/04/20 10:30 PM   Specimen: BLOOD  Result Value Ref Range Status   Specimen Description BLOOD LEFT  ANTECUBITAL  Final   Special Requests   Final    BOTTLES DRAWN AEROBIC AND ANAEROBIC Blood Culture adequate volume   Culture   Final    NO GROWTH 5 DAYS Performed at [facility], [place] [address] [place], [place] [address]    Report Status 01/09/2020 FINAL  Final  Blood culture (routine x 2)     Status: None   Collection Time: 01/04/20 10:30 PM   Specimen: BLOOD  Result Value Ref Range Status   Specimen Description BLOOD RIGHT ANTECUBITAL  Final   Special Requests   Final    BOTTLES DRAWN AEROBIC AND ANAEROBIC Blood Culture results may not be optimal due to an excessive volume of blood received in culture bottles   Culture   Final    NO GROWTH 5 DAYS Performed at [facility], [place] [address] [place], [place] [address]    Report Status 01/09/2020 FINAL  Final  SARS CORONAVIRUS 2 (TAT 6-24 HRS) Nasopharyngeal Nasopharyngeal Swab     Status: None   Collection Time: 01/05/20 12:57 AM   Specimen: Nasopharyngeal Swab  Result Value Ref Range Status   SARS Coronavirus 2 NEGATIVE NEGATIVE Final    Comment: (NOTE) SARS-CoV-2 target nucleic acids are NOT DETECTED. The SARS-CoV-2 RNA is generally detectable in upper and lower respiratory specimens during the acute phase of infection. Negative results do not preclude SARS-CoV-2 infection, do not rule out co-infections with other pathogens, and should not be used as the sole basis for treatment or other patient management decisions. Negative results must be combined with clinical observations, patient history, and epidemiological information. The expected result is Negative. Fact Sheet for Patients: SugarRoll.be Fact Sheet for Healthcare Providers: https://www.woods-mathews.com/ This test is not yet approved or cleared by the [place] FDA and  has been authorized for detection and/or diagnosis of SARS-CoV-2 by FDA under an Emergency Use Authorization (EUA). This EUA will remain  in  effect (meaning this test can be used) for the duration of the COVID-19 declaration under Section 56 4(b)(1) of the Act, 21 U.S.C. section 360bbb-3(b)(1), unless the authorization is terminated or revoked sooner. Performed at [facility], [place] [address] [place], [place] [address]   Culture, Urine     Status: Abnormal   Collection Time: 01/06/20  7:48 AM   Specimen: Urine, Random  Result Value Ref Range Status   Specimen Description URINE, RANDOM  Final   Special Requests   Final    NONE Performed at [facility], [place] [address], [place], [place] [address]    Culture >=100,000 COLONIES/mL ESCHERICHIA COLI (A)  Final   Report Status 01/08/2020 FINAL  Final   Organism ID, Bacteria ESCHERICHIA COLI (A)  Final      Susceptibility   Escherichia coli - MIC*    AMPICILLIN >=32 RESISTANT Resistant     CEFAZOLIN <=4 SENSITIVE Sensitive     CEFTRIAXONE <=0.25 SENSITIVE Sensitive     CIPROFLOXACIN <=0.25 SENSITIVE Sensitive     GENTAMICIN <=1 SENSITIVE Sensitive  IMIPENEM <=0.25 SENSITIVE Sensitive     NITROFURANTOIN <=16 SENSITIVE Sensitive     TRIMETH/SULFA <=20 SENSITIVE Sensitive     AMPICILLIN/SULBACTAM >=32 RESISTANT Resistant     PIP/TAZO 8 SENSITIVE Sensitive     * >=100,000 COLONIES/mL ESCHERICHIA COLI   Time spent: 30 min  SIGNED:   Marylu Lund, MD  [facility] 01/09/2020, 3:44 PM  If 7PM-7AM, please contact night-coverage

## 2020-01-09 NOTE — Progress Notes (Signed)
Order received to discharge patient.  Telemetry monitor removed and CCMD notified.  PIV access removed.  Pt's scripts delivered by [facility] pharmacy to pt's room.  Pt received phone call to inform her that her home equipment would be delivered to her house over the weekend rather than to the hospital room before DC.  Discharge instructions, follow up, medications and instructions for their use discussed with patient.

## 2020-01-09 NOTE — Progress Notes (Signed)
Inpatient Diabetes Program Recommendations  AACE/ADA: New Consensus Statement on Inpatient Glycemic Control (2015)  Target Ranges:  Prepandial:   less than 140 mg/dL      Peak postprandial:   less than 180 mg/dL (1-2 hours)      Critically ill patients:  140 - 180 mg/dL   Lab Results  Component Value Date   GLUCAP 193 (H) 01/09/2020   HGBA1C 12.2 (H) 01/05/2020    Review of Glycemic Control Results for [name], [name] (MRN [id number]) as of 01/09/2020 13:08  Ref. Range 01/08/2020 07:17 01/08/2020 07:53 01/08/2020 13:06 01/08/2020 16:37 01/08/2020 21:13 01/09/2020 06:05 01/09/2020 11:58  Glucose-Capillary Latest Ref Range: 70 - 99 mg/dL 65 (L) 122 (H) 84 240 (H) 323 (H) 223 (H) 193 (H)  Diabetes history:DM 2 Outpatient Diabetes medications:Lantus 50 units q AM, Novolog 3-20 units tid with meals for blood sugars>150 mg/dL Current orders for Inpatient glycemic control: Lantus40 units q AM, Novolog sensitive tid with meals and HS, Novolog 3 units tid with meals Inpatient Diabetes Program Recommendations:    Per chart review, patient did not receive Lantus on 4/22.  May consider reducing Lantus to 35 units daily (note she did get today's Lantus dose of 40 units daily).    Thanks,  Adah Perl, RN, BC-ADM Inpatient Diabetes Coordinator Pager [phone] (8a-5p)

## 2020-01-09 NOTE — Progress Notes (Signed)
Physical Therapy Treatment Patient Details Name: [name] MRN: [id number] DOB: 06-25-1956 Today's Date: 01/09/2020    History of Present Illness 64 year old female with past medical history of chronic low back pain, chronic diastolic congestive heart failure, diabetes mellitus type 2, gout, hyperlipidemia, hypertension, obstructive sleep apnea and obesity who presents to [facility] emergency department on 4/18 with complaints of left foot pain. LLE vascular ultrasound reveals occlusive disease of popliteal, posterior tibial, and peroneal arteries.    PT Comments    Patient progressing slowly towards PT goals. Gait distance limited today due to pain, weakness and dizziness. Required seated rest breaks x2 due to above. Pt with poor endurance and activity tolerance with mobility. Pt lives alone and is a high fall risk. Would benefit from manual w/c to improve mobility and independence in her home and to help decrease fall risk. Attempted to use rollator however pt is not safe with this device. Encouraged increasing mobility/activity while in the hospital. Will follow.   Follow Up Recommendations  Home health PT;Supervision for mobility/OOB     Equipment Recommendations  Wheelchair cushion (measurements PT);Wheelchair (measurements PT);Other (comment)(tub bench)    Recommendations for Other Services       Precautions / Restrictions Precautions Precautions: Fall Restrictions Weight Bearing Restrictions: No    Mobility  Bed Mobility               General bed mobility comments: Up in chair upon PT arrival.  Transfers Overall transfer level: Needs assistance Equipment used: Rolling walker (2 wheeled) Transfers: Sit to/from Stand Sit to Stand: Min guard         General transfer comment: min guard for safety, verbal cuing for hand placement when rising. Stood from [employer], from [employer], from rollator seat x1.  Ambulation/Gait Ambulation/Gait assistance: Min  guard Gait Distance (Feet): 12 Feet(+ 3' + 12') Assistive device: Rolling walker (2 wheeled);4-wheeled walker Gait Pattern/deviations: Step-through pattern;Decreased stride length;Trunk flexed;Antalgic Gait velocity: decr   General Gait Details: SLow, unsteady gait with worsening pain/dizziness with mobility. Attempted using rollator however pt not safe with it. 2 seated rest breaks after 12' and 3'.   Stairs             Wheelchair Mobility    Modified Rankin (Stroke Patients Only)       Balance Overall balance assessment: Needs assistance;History of Falls Sitting-balance support: Feet supported;No upper extremity supported Sitting balance-Leahy Scale: Fair     Standing balance support: During functional activity Standing balance-Leahy Scale: Poor Standing balance comment: reliant on external support in standing                            Cognition Arousal/Alertness: Awake/alert Behavior During Therapy: WFL for tasks assessed/performed Overall Cognitive Status: Within Functional Limits for tasks assessed                                        Exercises      General Comments General comments (skin integrity, edema, etc.): VSS      Pertinent Vitals/Pain Pain Assessment: Faces Faces Pain Scale: Hurts whole lot Pain Location: Lft foot Pain Descriptors / Indicators: Discomfort;Sore;Sharp;Cramping Pain Intervention(s): Repositioned;Monitored during session;Limited activity within patient's tolerance    Home Living  Prior Function            PT Goals (current goals can now be found in the care plan section) Progress towards PT goals: Progressing toward goals    Frequency    Min 3X/week      PT Plan Current plan remains appropriate    Co-evaluation              AM-PAC PT "6 Clicks" Mobility   Outcome Measure  Help needed turning from your back to your side while in a flat bed without  using bedrails?: A Little Help needed moving from lying on your back to sitting on the side of a flat bed without using bedrails?: A Little Help needed moving to and from a bed to a chair (including a wheelchair)?: A Little Help needed standing up from a chair using your arms (e.g., wheelchair or bedside chair)?: A Little Help needed to walk in hospital room?: A Little Help needed climbing 3-5 steps with a railing? : A Lot 6 Click Score: 17    End of Session Equipment Utilized During Treatment: Gait belt Activity Tolerance: Patient limited by pain Patient left: in chair;with call bell/phone within reach Nurse Communication: Mobility status PT Visit Diagnosis: Other abnormalities of gait and mobility (R26.89);Muscle weakness (generalized) (M62.81);Pain Pain - Right/Left: Left Pain - part of body: Leg     Time: 1327-1401 PT Time Calculation (min) (ACUTE ONLY): 34 min  Charges:  $Gait Training: 8-22 mins $Therapeutic Activity: 8-22 mins                     Marisa Severin, PT, DPT Acute Rehabilitation Services Pager [phone] Office [place] 01/09/2020, 3:20 PM

## 2020-01-12 ENCOUNTER — Inpatient Hospital Stay (HOSPITAL_COMMUNITY)
Admission: EM | Admit: 2020-01-12 | Discharge: 2020-02-17 | DRG: 207 | Disposition: E | Payer: BC Managed Care – PPO | Attending: Pulmonary Disease | Admitting: Pulmonary Disease

## 2020-01-12 ENCOUNTER — Other Ambulatory Visit: Payer: Self-pay

## 2020-01-12 ENCOUNTER — Encounter (HOSPITAL_COMMUNITY): Payer: Self-pay | Admitting: *Deleted

## 2020-01-12 DIAGNOSIS — R0602 Shortness of breath: Secondary | ICD-10-CM

## 2020-01-12 DIAGNOSIS — D631 Anemia in chronic kidney disease: Secondary | ICD-10-CM | POA: Diagnosis present

## 2020-01-12 DIAGNOSIS — F419 Anxiety disorder, unspecified: Secondary | ICD-10-CM | POA: Diagnosis present

## 2020-01-12 DIAGNOSIS — G9341 Metabolic encephalopathy: Secondary | ICD-10-CM | POA: Diagnosis present

## 2020-01-12 DIAGNOSIS — E872 Acidosis: Secondary | ICD-10-CM | POA: Diagnosis not present

## 2020-01-12 DIAGNOSIS — Z9049 Acquired absence of other specified parts of digestive tract: Secondary | ICD-10-CM

## 2020-01-12 DIAGNOSIS — Z794 Long term (current) use of insulin: Secondary | ICD-10-CM

## 2020-01-12 DIAGNOSIS — G4733 Obstructive sleep apnea (adult) (pediatric): Secondary | ICD-10-CM | POA: Diagnosis present

## 2020-01-12 DIAGNOSIS — L8990 Pressure ulcer of unspecified site, unspecified stage: Secondary | ICD-10-CM | POA: Insufficient documentation

## 2020-01-12 DIAGNOSIS — R001 Bradycardia, unspecified: Secondary | ICD-10-CM | POA: Diagnosis not present

## 2020-01-12 DIAGNOSIS — J441 Chronic obstructive pulmonary disease with (acute) exacerbation: Secondary | ICD-10-CM | POA: Diagnosis present

## 2020-01-12 DIAGNOSIS — D65 Disseminated intravascular coagulation [defibrination syndrome]: Secondary | ICD-10-CM | POA: Diagnosis not present

## 2020-01-12 DIAGNOSIS — L89312 Pressure ulcer of right buttock, stage 2: Secondary | ICD-10-CM | POA: Diagnosis present

## 2020-01-12 DIAGNOSIS — J440 Chronic obstructive pulmonary disease with acute lower respiratory infection: Secondary | ICD-10-CM | POA: Diagnosis present

## 2020-01-12 DIAGNOSIS — R519 Headache, unspecified: Secondary | ICD-10-CM | POA: Diagnosis present

## 2020-01-12 DIAGNOSIS — J95811 Postprocedural pneumothorax: Secondary | ICD-10-CM | POA: Diagnosis not present

## 2020-01-12 DIAGNOSIS — I130 Hypertensive heart and chronic kidney disease with heart failure and stage 1 through stage 4 chronic kidney disease, or unspecified chronic kidney disease: Secondary | ICD-10-CM | POA: Diagnosis present

## 2020-01-12 DIAGNOSIS — E46 Unspecified protein-calorie malnutrition: Secondary | ICD-10-CM | POA: Diagnosis not present

## 2020-01-12 DIAGNOSIS — Z4682 Encounter for fitting and adjustment of non-vascular catheter: Secondary | ICD-10-CM

## 2020-01-12 DIAGNOSIS — J80 Acute respiratory distress syndrome: Secondary | ICD-10-CM

## 2020-01-12 DIAGNOSIS — T41295A Adverse effect of other general anesthetics, initial encounter: Secondary | ICD-10-CM | POA: Diagnosis not present

## 2020-01-12 DIAGNOSIS — M109 Gout, unspecified: Secondary | ICD-10-CM | POA: Diagnosis present

## 2020-01-12 DIAGNOSIS — N179 Acute kidney failure, unspecified: Secondary | ICD-10-CM

## 2020-01-12 DIAGNOSIS — F329 Major depressive disorder, single episode, unspecified: Secondary | ICD-10-CM | POA: Diagnosis present

## 2020-01-12 DIAGNOSIS — Z978 Presence of other specified devices: Secondary | ICD-10-CM

## 2020-01-12 DIAGNOSIS — R0689 Other abnormalities of breathing: Secondary | ICD-10-CM

## 2020-01-12 DIAGNOSIS — Z841 Family history of disorders of kidney and ureter: Secondary | ICD-10-CM

## 2020-01-12 DIAGNOSIS — M1990 Unspecified osteoarthritis, unspecified site: Secondary | ICD-10-CM | POA: Diagnosis present

## 2020-01-12 DIAGNOSIS — Z66 Do not resuscitate: Secondary | ICD-10-CM | POA: Diagnosis not present

## 2020-01-12 DIAGNOSIS — A4189 Other specified sepsis: Secondary | ICD-10-CM | POA: Diagnosis not present

## 2020-01-12 DIAGNOSIS — I5033 Acute on chronic diastolic (congestive) heart failure: Secondary | ICD-10-CM | POA: Diagnosis present

## 2020-01-12 DIAGNOSIS — Z91018 Allergy to other foods: Secondary | ICD-10-CM

## 2020-01-12 DIAGNOSIS — R0902 Hypoxemia: Secondary | ICD-10-CM

## 2020-01-12 DIAGNOSIS — Z9119 Patient's noncompliance with other medical treatment and regimen: Secondary | ICD-10-CM

## 2020-01-12 DIAGNOSIS — N170 Acute kidney failure with tubular necrosis: Secondary | ICD-10-CM | POA: Diagnosis present

## 2020-01-12 DIAGNOSIS — E875 Hyperkalemia: Secondary | ICD-10-CM | POA: Diagnosis not present

## 2020-01-12 DIAGNOSIS — G8929 Other chronic pain: Secondary | ICD-10-CM | POA: Diagnosis present

## 2020-01-12 DIAGNOSIS — U071 COVID-19: Principal | ICD-10-CM | POA: Diagnosis present

## 2020-01-12 DIAGNOSIS — Y838 Other surgical procedures as the cause of abnormal reaction of the patient, or of later complication, without mention of misadventure at the time of the procedure: Secondary | ICD-10-CM | POA: Diagnosis not present

## 2020-01-12 DIAGNOSIS — I214 Non-ST elevation (NSTEMI) myocardial infarction: Secondary | ICD-10-CM | POA: Diagnosis not present

## 2020-01-12 DIAGNOSIS — J939 Pneumothorax, unspecified: Secondary | ICD-10-CM

## 2020-01-12 DIAGNOSIS — J9600 Acute respiratory failure, unspecified whether with hypoxia or hypercapnia: Secondary | ICD-10-CM

## 2020-01-12 DIAGNOSIS — Z7982 Long term (current) use of aspirin: Secondary | ICD-10-CM

## 2020-01-12 DIAGNOSIS — Z8249 Family history of ischemic heart disease and other diseases of the circulatory system: Secondary | ICD-10-CM

## 2020-01-12 DIAGNOSIS — E878 Other disorders of electrolyte and fluid balance, not elsewhere classified: Secondary | ICD-10-CM | POA: Diagnosis present

## 2020-01-12 DIAGNOSIS — J982 Interstitial emphysema: Secondary | ICD-10-CM | POA: Diagnosis present

## 2020-01-12 DIAGNOSIS — R6521 Severe sepsis with septic shock: Secondary | ICD-10-CM | POA: Diagnosis not present

## 2020-01-12 DIAGNOSIS — T82838A Hemorrhage of vascular prosthetic devices, implants and grafts, initial encounter: Secondary | ICD-10-CM | POA: Diagnosis not present

## 2020-01-12 DIAGNOSIS — H409 Unspecified glaucoma: Secondary | ICD-10-CM | POA: Diagnosis present

## 2020-01-12 DIAGNOSIS — L89102 Pressure ulcer of unspecified part of back, stage 2: Secondary | ICD-10-CM | POA: Diagnosis present

## 2020-01-12 DIAGNOSIS — E1122 Type 2 diabetes mellitus with diabetic chronic kidney disease: Secondary | ICD-10-CM | POA: Diagnosis present

## 2020-01-12 DIAGNOSIS — E1151 Type 2 diabetes mellitus with diabetic peripheral angiopathy without gangrene: Secondary | ICD-10-CM | POA: Diagnosis present

## 2020-01-12 DIAGNOSIS — Z6841 Body Mass Index (BMI) 40.0 and over, adult: Secondary | ICD-10-CM

## 2020-01-12 DIAGNOSIS — E11649 Type 2 diabetes mellitus with hypoglycemia without coma: Secondary | ICD-10-CM | POA: Diagnosis not present

## 2020-01-12 DIAGNOSIS — J189 Pneumonia, unspecified organism: Secondary | ICD-10-CM

## 2020-01-12 DIAGNOSIS — J9690 Respiratory failure, unspecified, unspecified whether with hypoxia or hypercapnia: Secondary | ICD-10-CM

## 2020-01-12 DIAGNOSIS — L89322 Pressure ulcer of left buttock, stage 2: Secondary | ICD-10-CM | POA: Diagnosis present

## 2020-01-12 DIAGNOSIS — Z87891 Personal history of nicotine dependence: Secondary | ICD-10-CM

## 2020-01-12 DIAGNOSIS — E785 Hyperlipidemia, unspecified: Secondary | ICD-10-CM | POA: Diagnosis present

## 2020-01-12 DIAGNOSIS — Z452 Encounter for adjustment and management of vascular access device: Secondary | ICD-10-CM

## 2020-01-12 DIAGNOSIS — J1282 Pneumonia due to coronavirus disease 2019: Secondary | ICD-10-CM | POA: Diagnosis present

## 2020-01-12 DIAGNOSIS — R34 Anuria and oliguria: Secondary | ICD-10-CM | POA: Diagnosis present

## 2020-01-12 DIAGNOSIS — Z79899 Other long term (current) drug therapy: Secondary | ICD-10-CM

## 2020-01-12 DIAGNOSIS — I739 Peripheral vascular disease, unspecified: Secondary | ICD-10-CM | POA: Diagnosis present

## 2020-01-12 DIAGNOSIS — E1165 Type 2 diabetes mellitus with hyperglycemia: Secondary | ICD-10-CM | POA: Diagnosis present

## 2020-01-12 DIAGNOSIS — T380X5A Adverse effect of glucocorticoids and synthetic analogues, initial encounter: Secondary | ICD-10-CM | POA: Diagnosis not present

## 2020-01-12 DIAGNOSIS — R778 Other specified abnormalities of plasma proteins: Secondary | ICD-10-CM | POA: Diagnosis present

## 2020-01-12 DIAGNOSIS — N1832 Chronic kidney disease, stage 3b: Secondary | ICD-10-CM | POA: Diagnosis present

## 2020-01-12 DIAGNOSIS — Z7902 Long term (current) use of antithrombotics/antiplatelets: Secondary | ICD-10-CM

## 2020-01-12 MED ORDER — ACETAMINOPHEN 325 MG PO TABS
650.0000 mg | ORAL_TABLET | Freq: Once | ORAL | Status: AC | PRN
  Administered 2020-01-13: 650 mg via ORAL
  Filled 2020-01-12: qty 2

## 2020-01-12 MED ORDER — SODIUM CHLORIDE 0.9% FLUSH: 3.0000 mL | Freq: Once | INTRAVENOUS | Status: DC

## 2020-01-12 NOTE — ED Triage Notes (Signed)
Pt arrived by [facility] from home. Had stent placed left leg on Friday. Has swelling to bilateral legs and increase in sob. Hx of chf. Has chest pain on inspiration. Febrile on arrival. spo2 90% on room air pta. spo2 99% on 2L [place].

## 2020-01-13 ENCOUNTER — Emergency Department (HOSPITAL_COMMUNITY): Payer: BC Managed Care – PPO

## 2020-01-13 ENCOUNTER — Other Ambulatory Visit: Payer: Self-pay

## 2020-01-13 ENCOUNTER — Inpatient Hospital Stay (HOSPITAL_COMMUNITY): Payer: BC Managed Care – PPO

## 2020-01-13 DIAGNOSIS — J9600 Acute respiratory failure, unspecified whether with hypoxia or hypercapnia: Secondary | ICD-10-CM | POA: Diagnosis present

## 2020-01-13 DIAGNOSIS — J441 Chronic obstructive pulmonary disease with (acute) exacerbation: Secondary | ICD-10-CM | POA: Diagnosis present

## 2020-01-13 DIAGNOSIS — A4189 Other specified sepsis: Secondary | ICD-10-CM | POA: Diagnosis not present

## 2020-01-13 DIAGNOSIS — J80 Acute respiratory distress syndrome: Secondary | ICD-10-CM

## 2020-01-13 DIAGNOSIS — R778 Other specified abnormalities of plasma proteins: Secondary | ICD-10-CM | POA: Diagnosis not present

## 2020-01-13 DIAGNOSIS — Z66 Do not resuscitate: Secondary | ICD-10-CM | POA: Diagnosis not present

## 2020-01-13 DIAGNOSIS — I5033 Acute on chronic diastolic (congestive) heart failure: Secondary | ICD-10-CM | POA: Diagnosis not present

## 2020-01-13 DIAGNOSIS — T82838A Hemorrhage of vascular prosthetic devices, implants and grafts, initial encounter: Secondary | ICD-10-CM | POA: Diagnosis not present

## 2020-01-13 DIAGNOSIS — N170 Acute kidney failure with tubular necrosis: Secondary | ICD-10-CM | POA: Diagnosis present

## 2020-01-13 DIAGNOSIS — J982 Interstitial emphysema: Secondary | ICD-10-CM | POA: Diagnosis present

## 2020-01-13 DIAGNOSIS — U071 COVID-19: Secondary | ICD-10-CM | POA: Diagnosis not present

## 2020-01-13 DIAGNOSIS — J9601 Acute respiratory failure with hypoxia: Secondary | ICD-10-CM | POA: Diagnosis not present

## 2020-01-13 DIAGNOSIS — I130 Hypertensive heart and chronic kidney disease with heart failure and stage 1 through stage 4 chronic kidney disease, or unspecified chronic kidney disease: Secondary | ICD-10-CM | POA: Diagnosis present

## 2020-01-13 DIAGNOSIS — R0602 Shortness of breath: Secondary | ICD-10-CM | POA: Diagnosis present

## 2020-01-13 DIAGNOSIS — J939 Pneumothorax, unspecified: Secondary | ICD-10-CM | POA: Diagnosis not present

## 2020-01-13 DIAGNOSIS — Z6841 Body Mass Index (BMI) 40.0 and over, adult: Secondary | ICD-10-CM

## 2020-01-13 DIAGNOSIS — R9431 Abnormal electrocardiogram [ECG] [EKG]: Secondary | ICD-10-CM | POA: Diagnosis not present

## 2020-01-13 DIAGNOSIS — M7989 Other specified soft tissue disorders: Secondary | ICD-10-CM | POA: Diagnosis not present

## 2020-01-13 DIAGNOSIS — R6521 Severe sepsis with septic shock: Secondary | ICD-10-CM | POA: Diagnosis not present

## 2020-01-13 DIAGNOSIS — I361 Nonrheumatic tricuspid (valve) insufficiency: Secondary | ICD-10-CM | POA: Diagnosis not present

## 2020-01-13 DIAGNOSIS — N179 Acute kidney failure, unspecified: Secondary | ICD-10-CM | POA: Diagnosis not present

## 2020-01-13 DIAGNOSIS — E785 Hyperlipidemia, unspecified: Secondary | ICD-10-CM

## 2020-01-13 DIAGNOSIS — J440 Chronic obstructive pulmonary disease with acute lower respiratory infection: Secondary | ICD-10-CM | POA: Diagnosis present

## 2020-01-13 DIAGNOSIS — I739 Peripheral vascular disease, unspecified: Secondary | ICD-10-CM | POA: Diagnosis present

## 2020-01-13 DIAGNOSIS — J1282 Pneumonia due to coronavirus disease 2019: Secondary | ICD-10-CM | POA: Diagnosis not present

## 2020-01-13 DIAGNOSIS — Y838 Other surgical procedures as the cause of abnormal reaction of the patient, or of later complication, without mention of misadventure at the time of the procedure: Secondary | ICD-10-CM | POA: Diagnosis not present

## 2020-01-13 DIAGNOSIS — E1151 Type 2 diabetes mellitus with diabetic peripheral angiopathy without gangrene: Secondary | ICD-10-CM | POA: Diagnosis present

## 2020-01-13 DIAGNOSIS — I340 Nonrheumatic mitral (valve) insufficiency: Secondary | ICD-10-CM | POA: Diagnosis not present

## 2020-01-13 DIAGNOSIS — I214 Non-ST elevation (NSTEMI) myocardial infarction: Secondary | ICD-10-CM | POA: Diagnosis not present

## 2020-01-13 DIAGNOSIS — J95811 Postprocedural pneumothorax: Secondary | ICD-10-CM | POA: Diagnosis not present

## 2020-01-13 DIAGNOSIS — G9341 Metabolic encephalopathy: Secondary | ICD-10-CM | POA: Diagnosis present

## 2020-01-13 DIAGNOSIS — N1832 Chronic kidney disease, stage 3b: Secondary | ICD-10-CM | POA: Diagnosis present

## 2020-01-13 DIAGNOSIS — E872 Acidosis: Secondary | ICD-10-CM | POA: Diagnosis not present

## 2020-01-13 DIAGNOSIS — E46 Unspecified protein-calorie malnutrition: Secondary | ICD-10-CM | POA: Diagnosis not present

## 2020-01-13 DIAGNOSIS — D65 Disseminated intravascular coagulation [defibrination syndrome]: Secondary | ICD-10-CM | POA: Diagnosis not present

## 2020-01-13 DIAGNOSIS — R34 Anuria and oliguria: Secondary | ICD-10-CM | POA: Diagnosis present

## 2020-01-13 LAB — FERRITIN: Ferritin: 1334 ng/mL — ABNORMAL HIGH (ref 11–307)

## 2020-01-13 LAB — CBC
HCT: 30.7 % — ABNORMAL LOW (ref 36.0–46.0)
Hemoglobin: 9.8 g/dL — ABNORMAL LOW (ref 12.0–15.0)
MCH: 30.2 pg (ref 26.0–34.0)
MCHC: 31.9 g/dL (ref 30.0–36.0)
MCV: 94.8 fL (ref 80.0–100.0)
Platelets: 110 10*3/uL — ABNORMAL LOW (ref 150–400)
RBC: 3.24 MIL/uL — ABNORMAL LOW (ref 3.87–5.11)
RDW: 17.1 % — ABNORMAL HIGH (ref 11.5–15.5)
WBC: 5.3 10*3/uL (ref 4.0–10.5)
nRBC: 0 % (ref 0.0–0.2)

## 2020-01-13 LAB — LACTATE DEHYDROGENASE: LDH: 627 U/L — ABNORMAL HIGH (ref 98–192)

## 2020-01-13 LAB — POC SARS CORONAVIRUS 2 AG -  ED: SARS Coronavirus 2 Ag: POSITIVE — AB

## 2020-01-13 LAB — CBG MONITORING, ED
Glucose-Capillary: 117 mg/dL — ABNORMAL HIGH (ref 70–99)
Glucose-Capillary: 206 mg/dL — ABNORMAL HIGH (ref 70–99)
Glucose-Capillary: 220 mg/dL — ABNORMAL HIGH (ref 70–99)

## 2020-01-13 LAB — BRAIN NATRIURETIC PEPTIDE: B Natriuretic Peptide: 193.8 pg/mL — ABNORMAL HIGH (ref 0.0–100.0)

## 2020-01-13 LAB — BASIC METABOLIC PANEL
Anion gap: 10 (ref 5–15)
BUN: 40 mg/dL — ABNORMAL HIGH (ref 8–23)
CO2: 19 mmol/L — ABNORMAL LOW (ref 22–32)
Calcium: 7.9 mg/dL — ABNORMAL LOW (ref 8.9–10.3)
Chloride: 106 mmol/L (ref 98–111)
Creatinine, Ser: 2.04 mg/dL — ABNORMAL HIGH (ref 0.44–1.00)
GFR calc Af Amer: 29 mL/min — ABNORMAL LOW (ref >60)
GFR calc non Af Amer: 25 mL/min — ABNORMAL LOW (ref >60)
Glucose, Bld: 238 mg/dL — ABNORMAL HIGH (ref 70–99)
Potassium: 4.2 mmol/L (ref 3.5–5.1)
Sodium: 135 mmol/L (ref 135–145)

## 2020-01-13 LAB — PROCALCITONIN: Procalcitonin: 0.21 ng/mL

## 2020-01-13 LAB — HEPATITIS B SURFACE ANTIGEN: Hepatitis B Surface Ag: NONREACTIVE

## 2020-01-13 LAB — C-REACTIVE PROTEIN: CRP: 21.7 mg/dL — ABNORMAL HIGH (ref <1.0)

## 2020-01-13 LAB — RESPIRATORY PANEL BY RT PCR (FLU A&B, COVID)
Influenza A by PCR: NEGATIVE
Influenza B by PCR: NEGATIVE
SARS Coronavirus 2 by RT PCR: POSITIVE — AB

## 2020-01-13 LAB — FIBRINOGEN: Fibrinogen: 664 mg/dL — ABNORMAL HIGH (ref 210–475)

## 2020-01-13 LAB — TROPONIN I (HIGH SENSITIVITY)
Troponin I (High Sensitivity): 49 ng/L — ABNORMAL HIGH (ref <18)
Troponin I (High Sensitivity): 50 ng/L — ABNORMAL HIGH (ref <18)

## 2020-01-13 LAB — D-DIMER, QUANTITATIVE: D-Dimer, Quant: 3.7 ug/mL-FEU — ABNORMAL HIGH (ref 0.00–0.50)

## 2020-01-13 MED ORDER — CLOPIDOGREL BISULFATE 75 MG PO TABS
75.0000 mg | ORAL_TABLET | Freq: Every day | ORAL | Status: DC
  Administered 2020-01-13 – 2020-01-29 (×17): 75 mg via ORAL
  Filled 2020-01-13 (×17): qty 1

## 2020-01-13 MED ORDER — VALACYCLOVIR HCL 500 MG PO TABS
1000.0000 mg | ORAL_TABLET | Freq: Three times a day (TID) | ORAL | Status: DC
  Administered 2020-01-13 – 2020-01-17 (×11): 1000 mg via ORAL
  Filled 2020-01-13 (×12): qty 2

## 2020-01-13 MED ORDER — LATANOPROST 0.005 % OP SOLN
1.0000 [drp] | Freq: Every day | OPHTHALMIC | Status: DC
  Administered 2020-01-14 – 2020-02-05 (×23): 1 [drp] via OPHTHALMIC
  Filled 2020-01-13: qty 2.5

## 2020-01-13 MED ORDER — ZINC SULFATE 220 (50 ZN) MG PO CAPS
220.0000 mg | ORAL_CAPSULE | Freq: Every day | ORAL | Status: DC
  Administered 2020-01-13 – 2020-01-29 (×17): 220 mg via ORAL
  Filled 2020-01-13 (×17): qty 1

## 2020-01-13 MED ORDER — PREDNISOLONE ACETATE 1 % OP SUSP
1.0000 [drp] | Freq: Two times a day (BID) | OPHTHALMIC | Status: DC
  Administered 2020-01-14 – 2020-02-05 (×46): 1 [drp] via OPHTHALMIC
  Filled 2020-01-13: qty 5

## 2020-01-13 MED ORDER — FUROSEMIDE 10 MG/ML IJ SOLN
80.0000 mg | Freq: Two times a day (BID) | INTRAMUSCULAR | Status: DC
  Administered 2020-01-13 – 2020-01-14 (×3): 80 mg via INTRAVENOUS
  Filled 2020-01-13 (×3): qty 8

## 2020-01-13 MED ORDER — DEXAMETHASONE 6 MG PO TABS
6.0000 mg | ORAL_TABLET | ORAL | Status: DC
  Administered 2020-01-13: 6 mg via ORAL
  Filled 2020-01-13: qty 2

## 2020-01-13 MED ORDER — ASPIRIN EC 81 MG PO TBEC
81.0000 mg | DELAYED_RELEASE_TABLET | Freq: Every day | ORAL | Status: DC
  Administered 2020-01-14 – 2020-01-29 (×16): 81 mg via ORAL
  Filled 2020-01-13 (×16): qty 1

## 2020-01-13 MED ORDER — DORZOLAMIDE HCL-TIMOLOL MAL 2-0.5 % OP SOLN
1.0000 [drp] | Freq: Two times a day (BID) | OPHTHALMIC | Status: DC
  Administered 2020-01-14 – 2020-02-05 (×46): 1 [drp] via OPHTHALMIC
  Filled 2020-01-13: qty 10

## 2020-01-13 MED ORDER — BRIMONIDINE TARTRATE 0.2 % OP SOLN
1.0000 [drp] | Freq: Two times a day (BID) | OPHTHALMIC | Status: DC
  Administered 2020-01-14 – 2020-02-05 (×46): 1 [drp] via OPHTHALMIC
  Filled 2020-01-13: qty 5

## 2020-01-13 MED ORDER — INSULIN DETEMIR 100 UNIT/ML ~~LOC~~ SOLN
0.1500 [IU]/kg | Freq: Two times a day (BID) | SUBCUTANEOUS | Status: DC
  Filled 2020-01-13 (×4): qty 0.17

## 2020-01-13 MED ORDER — ONDANSETRON HCL 4 MG PO TABS: 4.0000 mg | ORAL_TABLET | Freq: Four times a day (QID) | ORAL | Status: DC | PRN

## 2020-01-13 MED ORDER — VANCOMYCIN HCL 2000 MG/400ML IV SOLN
2000.0000 mg | Freq: Once | INTRAVENOUS | Status: AC
  Administered 2020-01-13: 2000 mg via INTRAVENOUS
  Filled 2020-01-13: qty 400

## 2020-01-13 MED ORDER — IPRATROPIUM-ALBUTEROL 20-100 MCG/ACT IN AERS
1.0000 | INHALATION_SPRAY | Freq: Four times a day (QID) | RESPIRATORY_TRACT | Status: DC
  Administered 2020-01-14 – 2020-01-27 (×44): 1 via RESPIRATORY_TRACT
  Filled 2020-01-13: qty 4

## 2020-01-13 MED ORDER — LINAGLIPTIN 5 MG PO TABS
5.0000 mg | ORAL_TABLET | Freq: Every day | ORAL | Status: DC
  Administered 2020-01-15 – 2020-01-27 (×13): 5 mg via ORAL
  Filled 2020-01-13 (×15): qty 1

## 2020-01-13 MED ORDER — HYDROCOD POLST-CPM POLST ER 10-8 MG/5ML PO SUER
5.0000 mL | Freq: Two times a day (BID) | ORAL | Status: DC | PRN
  Administered 2020-01-21: 5 mL via ORAL
  Filled 2020-01-13: qty 5

## 2020-01-13 MED ORDER — DOXAZOSIN MESYLATE 4 MG PO TABS
4.0000 mg | ORAL_TABLET | Freq: Every day | ORAL | Status: DC
  Administered 2020-01-14 – 2020-01-25 (×12): 4 mg via ORAL
  Filled 2020-01-13 (×14): qty 1

## 2020-01-13 MED ORDER — COLCHICINE 0.6 MG PO TABS
0.6000 mg | ORAL_TABLET | Freq: Every day | ORAL | Status: DC
  Administered 2020-01-13 – 2020-01-15 (×3): 0.6 mg via ORAL
  Filled 2020-01-13 (×3): qty 1

## 2020-01-13 MED ORDER — FAMOTIDINE 20 MG PO TABS
20.0000 mg | ORAL_TABLET | Freq: Every day | ORAL | Status: DC
  Administered 2020-01-13 – 2020-01-16 (×4): 20 mg via ORAL
  Filled 2020-01-13 (×4): qty 1

## 2020-01-13 MED ORDER — ASCORBIC ACID 500 MG PO TABS
500.0000 mg | ORAL_TABLET | Freq: Every day | ORAL | Status: DC
  Administered 2020-01-14 – 2020-01-30 (×17): 500 mg via ORAL
  Filled 2020-01-13 (×18): qty 1

## 2020-01-13 MED ORDER — ONDANSETRON HCL 4 MG/2ML IJ SOLN
4.0000 mg | Freq: Four times a day (QID) | INTRAMUSCULAR | Status: DC | PRN
  Administered 2020-01-13: 4 mg via INTRAVENOUS
  Filled 2020-01-13: qty 2

## 2020-01-13 MED ORDER — ATORVASTATIN CALCIUM 40 MG PO TABS
40.0000 mg | ORAL_TABLET | Freq: Every day | ORAL | Status: DC
  Administered 2020-01-13 – 2020-01-30 (×18): 40 mg via ORAL
  Filled 2020-01-13 (×19): qty 1

## 2020-01-13 MED ORDER — GUAIFENESIN-DM 100-10 MG/5ML PO SYRP
10.0000 mL | ORAL_SOLUTION | ORAL | Status: DC | PRN
  Filled 2020-01-13: qty 10

## 2020-01-13 MED ORDER — INSULIN ASPART 100 UNIT/ML ~~LOC~~ SOLN
0.0000 [IU] | SUBCUTANEOUS | Status: DC
  Administered 2020-01-13: 7 [IU] via SUBCUTANEOUS
  Administered 2020-01-14: 4 [IU] via SUBCUTANEOUS
  Administered 2020-01-14: 3 [IU] via SUBCUTANEOUS
  Administered 2020-01-14: 4 [IU] via SUBCUTANEOUS

## 2020-01-13 MED ORDER — SODIUM CHLORIDE 0.9 % IV SOLN
100.0000 mg | Freq: Every day | INTRAVENOUS | Status: AC
  Administered 2020-01-14 – 2020-01-17 (×4): 100 mg via INTRAVENOUS
  Filled 2020-01-13 (×4): qty 20

## 2020-01-13 MED ORDER — OXYCODONE-ACETAMINOPHEN 5-325 MG PO TABS
1.0000 | ORAL_TABLET | Freq: Four times a day (QID) | ORAL | Status: DC | PRN
  Administered 2020-01-13: 2 via ORAL
  Administered 2020-01-15 (×2): 1 via ORAL
  Administered 2020-01-16 – 2020-01-21 (×6): 2 via ORAL
  Filled 2020-01-13: qty 2
  Filled 2020-01-13: qty 1
  Filled 2020-01-13 (×3): qty 2
  Filled 2020-01-13: qty 1
  Filled 2020-01-13 (×4): qty 2

## 2020-01-13 MED ORDER — ACETAMINOPHEN 325 MG PO TABS
650.0000 mg | ORAL_TABLET | Freq: Four times a day (QID) | ORAL | Status: DC | PRN
  Administered 2020-01-18: 650 mg via ORAL
  Filled 2020-01-13 (×2): qty 2

## 2020-01-13 MED ORDER — VANCOMYCIN HCL 1250 MG/250ML IV SOLN: 1250.0000 mg | INTRAVENOUS | Status: DC

## 2020-01-13 MED ORDER — SODIUM CHLORIDE 0.9 % IV SOLN
2.0000 g | Freq: Once | INTRAVENOUS | Status: AC
  Administered 2020-01-13: 2 g via INTRAVENOUS
  Filled 2020-01-13: qty 2

## 2020-01-13 MED ORDER — SODIUM CHLORIDE 0.9 % IV SOLN
200.0000 mg | Freq: Once | INTRAVENOUS | Status: AC
  Administered 2020-01-13: 200 mg via INTRAVENOUS
  Filled 2020-01-13: qty 40

## 2020-01-13 MED ORDER — LISINOPRIL 10 MG PO TABS: 5.0000 mg | ORAL_TABLET | Freq: Every day | ORAL | Status: DC

## 2020-01-13 NOTE — Progress Notes (Signed)
Pharmacy Antibiotic Note  [name] is a 64 y.o. female admitted on 01/05/2020 with pneumonia.  Pharmacy has been consulted for vancomycin dosing. Pt is afebrile with Tmax 101.5 and WBC is WNL. Scr is elevated at 2.04.   Plan: Vancomycin 2gm IV x 1 then 1250mg  IV Q48H F/u renal fxn, C&S, clinical status and peak/trough at [facility] F/u continuation of gram negative coverage    Temp (24hrs), Avg:99.8 F (37.7 C), Min:98.1 F (36.7 C), Max:101.5 F (38.6 C)  Recent Labs  Lab 01/07/20 0225 01/08/20 0303 01/09/20 0308 01/13/20 0002  WBC 3.8* 3.5* 4.2 5.3  CREATININE 2.18* 1.92* 1.87* 2.04*    Estimated Creatinine Clearance: 33.4 mL/min (A) (by C-G formula based on SCr of 2.04 mg/dL (H)).    Allergies  Allergen Reactions  . Pork Allergy   . Pork-Derived Products Nausea And Vomiting    Antimicrobials this admission: Vanc 4/27>> Cefepime x 1 4/27  Dose adjustments this admission: N/A  Microbiology results: Pending  Thank you for allowing pharmacy to be a part of this patient's care.  Alnisa Hasley, Rande Lawman 01/13/2020 10:18 AM

## 2020-01-13 NOTE — ED Notes (Signed)
Lunch Tray Ordered @1144. 

## 2020-01-13 NOTE — H&P (Signed)
History and Physical    [name] [id number] DOB: 23-Apr-1956 DOA: 01/03/2020  Referring MD/NP/PA: Franchot Heidelberg, PA-C PCP: Javier Docker, MD  Patient coming from: Home  Chief Complaint: Shortness of breath  I have personally briefly reviewed patient's old medical records in [place]   HPI: [name] is a 64 y.o. female with medical history significant of HTN, HLD DM type II, CKD, chronic diastolic CHF, COPD, PVD, and gout presented with complaints of shortness of breath.  Patient had just recently been hospitalized from 4/18-4/23, with subacute limb ischemia of the left leg which patient underwent aortogram with stent placement.  She is placed on aspirin and Plavix and discharged home.  Since being home she reports having a productive cough and progressively worsening swelling of the lower extremities right worse leg despite taking torsemide as advised.  She has been unable to get up or move around at home because of all of the fluid that she feels this on her.  Notes associated symptoms of at times she is coughed up a small amount of blood with her sputum, but was told that this could be normal after being intubated.  Patient does not require oxygen and denies any recent sick contacts to her knowledge. Patient had not received the Covid vaccine. The patient's daughter had recently test neg prior to her mother coming in the hospital.  ED Course: Upon admission into the emergency department patient was seen to be febrile up to 101.5 F, respirations 20-23, and O2 saturations noted to be as low as 90% on room air placed on  2 L of nasal cannula oxygen.  Labs significant for hemoglobin 9.8, platelets 110, BUN 40, creatinine 2.04, glucose 238, calcium 7.9.  Chest x-ray noted moderate severity of bilateral infiltrates.  Point-of-care COVID-19 screening positive.  Patient having given Tylenol, vancomycin, and cefepime.  TRH called to admit.  Review of Systems  Constitutional:  Positive for fever and malaise/fatigue.  HENT: Positive for sore throat. Negative for congestion.   Eyes: Negative for photophobia and pain.  Respiratory: Positive for cough, sputum production and shortness of breath.   Cardiovascular: Positive for chest pain (With coughing only) and leg swelling.  Gastrointestinal: Negative for abdominal pain and vomiting.  Genitourinary: Negative for dysuria and hematuria.  Musculoskeletal: Positive for myalgias.  Skin: Negative for rash.  Neurological: Negative for loss of consciousness and headaches.  Psychiatric/Behavioral: Negative for memory loss and substance abuse.    Past Medical History:  Diagnosis Date  . Achilles rupture   . Anxiety   . Arthritis   . Asthma   . Chronic back pain   . Chronic diastolic heart failure ([place]) 11/30/2016  . Chronic headaches   . Chronic leg pain    bilaterally  . CHRONIC OBSTRUCTIVE PULMONARY DISEASE, MODERATE 03/26/2010   Annotation: with moderate restrictive lung disease as well per PFTs  04/02/07--actual numbers not noted in old records Qualifier: Diagnosis of  By: Amil Amen MD, Benjamine Mola    . Depression   . Diabetes mellitus 1999   T2DM  . Glaucoma   . Gout   . H/O gestational diabetes mellitus, not currently pregnant   . Hyperlipidemia   . Hypertension   . Morbid obesity ([place])   . Obstructive sleep apnea on CPAP 07/02/2009   Qualifier: Diagnosis of  By: Amil Amen MD, Benjamine Mola    . OSA (obstructive sleep apnea)   . Sciatica     Past Surgical History:  Procedure Laterality Date  . ABDOMINAL AORTOGRAM  W/LOWER EXTREMITY Bilateral 01/08/2020   Procedure: ABDOMINAL AORTOGRAM W/LOWER EXTREMITY;  Surgeon: Marty Heck, MD;  Location: [facility];  Service: Cardiovascular;  Laterality: Bilateral;  . BACK SURGERY    . BREAST EXCISIONAL BIOPSY Right   . CERVICAL SPINE SURGERY    . CESAREAN SECTION  12/10/89  . CHOLECYSTECTOMY  1991  . LUMBAR SPINE SURGERY    . PERIPHERAL VASCULAR  INTERVENTION Left 01/08/2020   Procedure: PERIPHERAL VASCULAR INTERVENTION;  Surgeon: Marty Heck, MD;  Location: [facility];  Service: Cardiovascular;  Laterality: Left;  . RIGHT HEART CATH N/A 12/06/2016   Procedure: Right Heart Cath;  Surgeon: Larey Dresser, MD;  Location: [facility];  Service: Cardiovascular;  Laterality: N/A;  . TUBAL LIGATION       reports that she quit smoking about 21 years ago. Her smoking use included cigarettes. She has never used smokeless tobacco. She reports current alcohol use. She reports that she does not use drugs.  Allergies  Allergen Reactions  . Pork Allergy   . Pork-Derived Products Nausea And Vomiting    Family History  Problem Relation Age of Onset  . Heart failure Mother   . Renal Disease Mother     Prior to Admission medications   Medication Sig Start Date End Date Taking? Authorizing Provider  acetaminophen (TYLENOL) 500 MG tablet Take 500-1,000 mg by mouth every 6 (six) hours as needed for mild pain or headache.     [provider]  albuterol (PROVENTIL HFA;VENTOLIN HFA) 108 (90 BASE) MCG/ACT inhaler Inhale 2 puffs into the lungs every 6 (six) hours as needed for wheezing or shortness of breath.     Robyn Haber, MD  [facility] P 0.15 % ophthalmic solution Place 1 drop into both eyes 2 (two) times daily.  07/03/19   [provider]  aspirin EC 81 MG tablet Take 81 mg by mouth daily with breakfast.    [provider]  atorvastatin (LIPITOR) 40 MG tablet Take 1 tablet (40 mg total) by mouth daily. 01/09/20 02/08/20  Donne Hazel, MD  clopidogrel (PLAVIX) 75 MG tablet Take 1 tablet (75 mg total) by mouth daily. 01/10/20 02/09/20  Donne Hazel, MD  colchicine 0.6 MG tablet Take 0.6 mg by mouth daily.    [provider]  dorzolamide-timolol (COSOPT) 22.3-6.8 MG/ML ophthalmic solution Place 1 drop into both eyes 2 (two) times daily.    [provider]  doxazosin (CARDURA) 4  MG tablet Take 4 mg by mouth at bedtime.    [provider]  insulin aspart (NOVOLOG FLEXPEN) 100 UNIT/ML FlexPen Inject 3-20 Units into the skin 3 (three) times daily as needed for high blood sugar (CBG 150).    [provider]  insulin glargine (LANTUS) 100 unit/mL SOPN Inject 50 Units into the skin daily before breakfast.    [provider]  liraglutide (VICTOZA) 18 MG/3ML SOPN Inject 1.8 mg into the skin daily.     [provider]  lisinopril (PRINIVIL,ZESTRIL) 5 MG tablet Take 5 mg by mouth daily with breakfast.  06/18/17   [provider]  oxyCODONE-acetaminophen (PERCOCET/ROXICET) 5-325 MG tablet Take 1-2 tablets by mouth every 6 (six) hours as needed for moderate pain. 01/09/20   Donne Hazel, MD  prednisoLONE acetate (PRED FORTE) 1 % ophthalmic suspension Place 1 drop into the right eye in the morning and at bedtime. 01/01/20   [provider]  PRESCRIPTION MEDICATION Inhale into the lungs  at bedtime. CPAP    [provider]  torsemide (DEMADEX) 20 MG tablet Take 3 tablets (60 mg total) by mouth as directed. Take 60 mg in the AM. You may take 60 mg in the PM as needed for swelling Patient taking differently: Take 40 mg by mouth daily.  12/04/17   Josue Hector, MD  Travoprost, BAK Free, (TRAVATAN) 0.004 % SOLN ophthalmic solution Place 1 drop into both eyes at bedtime.    [provider]  valACYclovir (VALTREX) 1000 MG tablet Take 1,000 mg by mouth 3 (three) times daily. 09/29/19   [provider]  Vitamin D, Ergocalciferol, (DRISDOL) 50000 units CAPS capsule Take 1 capsule (50,000 Units total) by mouth every 7 (seven) days. Patient not taking: Reported on 01/05/2020 09/13/17   Adora Fridge, RPH-CPP    Physical Exam:  Constitutional: Obese female who appears acutely ill in some respiratory distress Vitals:   01/13/20 0604 01/13/20 0814 01/13/20 1006 01/13/20 1007  BP: (!) 159/110  136/75   Pulse: 84    61  Resp: 17  20 (!) 23  Temp:  98.1 F (36.7 C)    TempSrc:  Oral    SpO2: 100%   100%   Eyes: PERRL, lids and conjunctivae normal ENMT: Mucous membranes are moist. Posterior pharynx clear of any exudate or lesions.  Neck: normal, supple, no masses, no thyromegaly Respiratory: Tachypneic with rhonchi appreciated bilaterally.  Patient on 2 L of nasal cannula oxygen with O2 saturations currently maintained. Cardiovascular: Regular rate and rhythm, no murmurs / rubs / gallops.  Least 2+ pitting bilateral lower extremity edema. Abdomen: no tenderness, no masses palpated. No hepatosplenomegaly. Bowel sounds positive.  Musculoskeletal: no clubbing / cyanosis. No joint deformity upper and lower extremities. Good ROM, no contractures. Normal muscle tone.  Skin: Surgical site appears to be healing well. Neurologic: CN 2-12 grossly intact. Sensation intact, DTR normal. Strength 5/5 in all 4.  Psychiatric: Normal judgment and insight. Alert and oriented x 3. Normal mood.     Labs on Admission: I have personally reviewed following labs and imaging studies  CBC: Recent Labs  Lab 01/07/20 0225 01/08/20 0303 01/09/20 0308 01/13/20 0002  WBC 3.8* 3.5* 4.2 5.3  NEUTROABS 2.6 1.9  --   --   HGB 10.1* 9.1* 9.7* 9.8*  HCT 31.6* 28.8* 30.8* 30.7*  MCV 95.2 96.0 96.0 94.8  PLT 102* 93* 83* [id number]*   Basic Metabolic Panel: Recent Labs  Lab 01/07/20 0225 01/08/20 0303 01/09/20 0308 01/13/20 0002  NA 135 136 135 135  K 5.0 4.6 4.8 4.2  CL 107 112* 110 106  CO2 18* 17* 16* 19*  GLUCOSE 271* 113* 267* 238*  BUN 50* 40* 37* 40*  CREATININE 2.18* 1.92* 1.87* 2.04*  CALCIUM 8.3* 8.2* 8.2* 7.9*   GFR: Estimated Creatinine Clearance: 33.4 mL/min (A) (by C-G formula based on SCr of 2.04 mg/dL (H)). Liver Function Tests: No results for input(s): AST, ALT, ALKPHOS, BILITOT, PROT, ALBUMIN in the last 168 hours. No results for input(s): LIPASE, AMYLASE in the last 168 hours. No results for  input(s): AMMONIA in the last 168 hours. Coagulation Profile: No results for input(s): INR, PROTIME in the last 168 hours. Cardiac Enzymes: No results for input(s): CKTOTAL, CKMB, CKMBINDEX, TROPONINI in the last 168 hours. BNP (last 3 results) No results for input(s): PROBNP in the last 8760 hours. HbA1C: No results for input(s): HGBA1C in the last 72 hours. CBG: Recent Labs  Lab 01/08/20 1637 01/08/20 2113  01/09/20 0605 01/09/20 1158 01/09/20 1601  GLUCAP 240* 323* 223* 193* 235*   Lipid Profile: No results for input(s): CHOL, HDL, LDLCALC, TRIG, CHOLHDL, LDLDIRECT in the last 72 hours. Thyroid Function Tests: No results for input(s): TSH, T4TOTAL, FREET4, T3FREE, THYROIDAB in the last 72 hours. Anemia Panel: No results for input(s): VITAMINB12, FOLATE, FERRITIN, TIBC, IRON, RETICCTPCT in the last 72 hours. Urine analysis:    Component Value Date/Time   COLORURINE YELLOW 01/06/2020 1400   APPEARANCEUR CLOUDY (A) 01/06/2020 1400   LABSPEC 1.014 01/06/2020 1400   PHURINE 5.0 01/06/2020 1400   GLUCOSEU NEGATIVE 01/06/2020 1400   HGBUR NEGATIVE 01/06/2020 1400   HGBUR negative 08/20/2009 0807   BILIRUBINUR NEGATIVE 01/06/2020 1400   BILIRUBINUR neg 05/14/2013 1111   KETONESUR NEGATIVE 01/06/2020 1400   PROTEINUR NEGATIVE 01/06/2020 1400   UROBILINOGEN 1.0 11/11/2014 1215   NITRITE POSITIVE (A) 01/06/2020 1400   LEUKOCYTESUR LARGE (A) 01/06/2020 1400   Sepsis Labs: Recent Results (from the past 240 hour(s))  Blood culture (routine x 2)     Status: None   Collection Time: 01/04/20 10:30 PM   Specimen: BLOOD  Result Value Ref Range Status   Specimen Description BLOOD LEFT ANTECUBITAL  Final   Special Requests   Final    BOTTLES DRAWN AEROBIC AND ANAEROBIC Blood Culture adequate volume   Culture   Final    NO GROWTH 5 DAYS Performed at [facility], [place] [address], [place], [place] [address]    Report Status 01/09/2020 FINAL  Final  Blood culture (routine x 2)      Status: None   Collection Time: 01/04/20 10:30 PM   Specimen: BLOOD  Result Value Ref Range Status   Specimen Description BLOOD RIGHT ANTECUBITAL  Final   Special Requests   Final    BOTTLES DRAWN AEROBIC AND ANAEROBIC Blood Culture results may not be optimal due to an excessive volume of blood received in culture bottles   Culture   Final    NO GROWTH 5 DAYS Performed at [facility], [place] [address], [place], [place] [address]    Report Status 01/09/2020 FINAL  Final  SARS CORONAVIRUS 2 (TAT 6-24 HRS) Nasopharyngeal Nasopharyngeal Swab     Status: None   Collection Time: 01/05/20 12:57 AM   Specimen: Nasopharyngeal Swab  Result Value Ref Range Status   SARS Coronavirus 2 NEGATIVE NEGATIVE Final    Comment: (NOTE) SARS-CoV-2 target nucleic acids are NOT DETECTED. The SARS-CoV-2 RNA is generally detectable in upper and lower respiratory specimens during the acute phase of infection. Negative results do not preclude SARS-CoV-2 infection, do not rule out co-infections with other pathogens, and should not be used as the sole basis for treatment or other patient management decisions. Negative results must be combined with clinical observations, patient history, and epidemiological information. The expected result is Negative. Fact Sheet for Patients: SugarRoll.be Fact Sheet for Healthcare Providers: https://www.woods-mathews.com/ This test is not yet approved or cleared by the [place] FDA and  has been authorized for detection and/or diagnosis of SARS-CoV-2 by FDA under an Emergency Use Authorization (EUA). This EUA will remain  in effect (meaning this test can be used) for the duration of the COVID-19 declaration under Section 56 4(b)(1) of the Act, 21 U.S.C. section 360bbb-3(b)(1), unless the authorization is terminated or revoked sooner. Performed at [facility], [place] [address], [place], [place] [address]   Culture,  Urine     Status: Abnormal  Collection Time: 01/06/20  7:48 AM   Specimen: Urine, Random  Result Value Ref Range Status   Specimen Description URINE, RANDOM  Final   Special Requests   Final    NONE Performed at [facility], [place] [address] [place], [place] [address]    Culture >=100,000 COLONIES/mL ESCHERICHIA COLI (A)  Final   Report Status 01/08/2020 FINAL  Final   Organism ID, Bacteria ESCHERICHIA COLI (A)  Final      Susceptibility   Escherichia coli - MIC*    AMPICILLIN >=32 RESISTANT Resistant     CEFAZOLIN <=4 SENSITIVE Sensitive     CEFTRIAXONE <=0.25 SENSITIVE Sensitive     CIPROFLOXACIN <=0.25 SENSITIVE Sensitive     GENTAMICIN <=1 SENSITIVE Sensitive     IMIPENEM <=0.25 SENSITIVE Sensitive     NITROFURANTOIN <=16 SENSITIVE Sensitive     TRIMETH/SULFA <=20 SENSITIVE Sensitive     AMPICILLIN/SULBACTAM >=32 RESISTANT Resistant     PIP/TAZO 8 SENSITIVE Sensitive     * >=100,000 COLONIES/mL ESCHERICHIA COLI     Radiological Exams on Admission: DG Chest 2 View  Result Date: 01/13/2020 CLINICAL DATA:  Shortness of breath and bilateral leg swelling. EXAM: CHEST - 2 VIEW COMPARISON:  November 30, 2016 FINDINGS: Moderate severity diffuse multifocal infiltrates are seen, bilaterally. There is no evidence of a pleural effusion or pneumothorax. The cardiac silhouette is mildly enlarged. A radiopaque fusion plate and screws are seen overlying the lower cervical spine. The visualized skeletal structures are otherwise unremarkable. Radiopaque surgical clips are seen within the right upper quadrant. IMPRESSION: Moderate severity bilateral multifocal infiltrates. Electronically Signed   By: Virgina Norfolk M.D.   On: 01/13/2020 00:27    EKG: Independently reviewed.  Normal sinus rhythm at 82 bpm   Assessment/Plan  Acute respiratory failure due to pneumonia secondary to COVID-19: Acute.  Patient found to be acute respiratory distress.  Chest x-ray showing moderate bilateral  infiltrates.  COVID-19 screening positive.  Unclear recent sick contact of patient just recently hospitalized.  Patient had initially been started on [place] antibiotics vancomycin and cefepime for presumed healthcare associated pneumonia.  Suspecting symptoms likely secondary to virus.  Procalcitonin other inflammatory markers pending. -Admit to a medical telemetry bed -COVID-19 order set utilized -Continuous pulse oximetry with nasal cannula oxygen to maintain O2 saturation greater than 90% -Follow-up full code -Combivent inhaler -Remdesivir per pharmacy -Decadron -Antitussives as needed -Vitamin C and zinc -Monitor inflammatory markers   -Tylenol as needed for fever -Continue antibiotics vancomycin and cefepime and consider de-escalating when medically appropriate  Elevated troponin: Troponin appear flat 49->50->52.  She reports some chest discomfort, but only with coughing.  EKG without significant ischemic changes.  Suspect secondary to demand in the setting of respiratory distress. -Continue to Monitor  Diastolic CHF: Acute on chronic. Last EF noted to be 60-65% by echocardiogram in 2018.  BNP elevated at 193.  Patient had been on torsemide po at home without improvement in symptoms. -Strict intake and output -Daily weights -Lasix 80 mg IV BID -Adjust diuresis as needed -Elevate lower extremity  Diabetes mellitus type 2, with hyperglycemia: On admission initial blood glucose elevated up to 238.  Home insulin regimen includes Lantus 40 units daily, Victoza 1.8 mg daily, and NovoLog with meals -Hypoglycemic protocols -Tradjenta -Levemir 17 units twice daily  -CBGs every 4 hours with resistant SSI resistant -Adjust insulin regimen as needed  Peripheral vascular disease:During recent admission with left leg subacute ischemia.  Patient underwent stent placement on the left  leg during last hospitalization with vascular surgery. -Continue Plavix, asa, and statin  Anemia of chronic  disease: Hemoglobin 9.8 which appears near previous baseline at discharge.  Patient only notes mild hemoptysis coughing. -Continue to monitor.  Chronic kidney disease stage III/IV: On admission patient presents with creatinine 2.04 with BUN to 40.  Creatinine appears near patient's baseline -Continue monitoring of kidney function with diuresis  Thrombocytopenia: Platelet count 110 near patient's baseline. -Continue to monitor  History of gout -Continue colchicine  Hyperlipidemia -Continue Crestor  OSA -Continue CPAP at night  Morbid obesity: BMI 48.15 kg/m   DVT prophylaxis: SCDs Code Status: Full Family Communication: Daughter updated over the phone Disposition Plan: To be determined Consults called: None Admission status: Inpatient  Norval Morton MD [facility] Pager [phone]   If 7PM-7AM, please contact night-coverage [email or web address] Password [facility]  01/13/2020, 10:34 AM

## 2020-01-13 NOTE — ED Provider Notes (Signed)
[facility] EMERGENCY DEPARTMENT Provider Note   CSN: [id number] Arrival date & time: 01/02/2020  2329     History Chief Complaint  Patient presents with  . Shortness of Breath  . Chest Pain    [name] is a 64 y.o. female presenting for evaluation of shortness of breath.  Patient states for the past 4 days, she has had gradually worsening shortness of breath.  Is to the point where she is unable to get out of bed to go to the bathroom due to his shortness of breath.  She reports associated cough.  She denies known fever, although upon arrival to the ER she was febrile at one 101.5.  She has a history of heart failure, states her symptoms feel similar to when she is fluid overloaded.  She has been taking her torsemide as prescribed.  She does not wear oxygen at home.  She was recently mated to the hospital and had a stent placed in her left leg for subacute ischemic limb.  She denies sick contacts.  She denies contact with known COVID-19 positive person.  She reports chest pain only when coughing, no chest pain at rest.  Additional history obtained from chart review.  Patient with a history of chronic back pain, CHF, COPD, diabetes, depression, hypertension, hyperlipidemia, obesity.     HPI     Past Medical History:  Diagnosis Date  . Achilles rupture   . Anxiety   . Arthritis   . Asthma   . Chronic back pain   . Chronic diastolic heart failure ([place]) 11/30/2016  . Chronic headaches   . Chronic leg pain    bilaterally  . CHRONIC OBSTRUCTIVE PULMONARY DISEASE, MODERATE 03/26/2010   Annotation: with moderate restrictive lung disease as well per PFTs  04/02/07--actual numbers not noted in old records Qualifier: Diagnosis of  By: Amil Amen MD, Benjamine Mola    . Depression   . Diabetes mellitus 1999   T2DM  . Glaucoma   . Gout   . H/O gestational diabetes mellitus, not currently pregnant   . Hyperlipidemia   . Hypertension   . Morbid obesity ([place])   .  Obstructive sleep apnea on CPAP 07/02/2009   Qualifier: Diagnosis of  By: Amil Amen MD, Benjamine Mola    . OSA (obstructive sleep apnea)   . Sciatica     Patient Active Problem List   Diagnosis Date Noted  . Spinal stenosis of lumbar region 01/05/2020  . Acute foot pain, left 01/05/2020  . Lower limb ischemia 01/05/2020  . Visit for routine gyn exam 07/15/2019  . STD exposure 07/15/2019  . Uterine fibroid 07/15/2019  . Hypertensive heart and renal disease   . Chronic diastolic heart failure ([place]) 11/30/2016  . CKD (chronic kidney disease) stage 3, GFR 30-59 ml/min 11/30/2016  . Thrombocytopenia ([place]) 11/30/2016  . Mixed incontinence 04/14/2015  . Asthma in adult without complication [id number]  . Primary open angle glaucoma of both eyes, severe stage 10/10/2014  . Arthritis of knee 01/05/2012  . PMB (postmenopausal bleeding) 05/09/2010  . ANEMIA 05/05/2010  . DEPRESSION 04/26/2010  . TMJ SYNDROME 04/26/2010  . DEGENERATIVE DISC DISEASE, CERVICAL SPINE 04/26/2010  . CHRONIC OBSTRUCTIVE PULMONARY DISEASE, MODERATE 03/26/2010  . Chronic obstructive pulmonary disease, unspecified ([place]) 03/26/2010  . MASTALGIA 10/08/2009  . Iron deficiency anemia 07/12/2009  . Uncontrolled type 2 diabetes mellitus with stage 3 chronic kidney disease, with long-term current use of insulin ([place]) 07/02/2009  . Hyperlipidemia 07/02/2009  . BMI 45.0-49.9, adult ([place])  07/02/2009  . Obstructive sleep apnea on CPAP 07/02/2009  . Glaucoma 07/02/2009  . Essential hypertension 07/02/2009  . ASTHMA 07/02/2009  . LOW BACK PAIN SYNDROME 07/02/2009  . BACK PAIN 07/02/2009  . PERIPHERAL EDEMA 07/02/2009  . TOBACCO USE, QUIT 07/02/2009  . HEMORRHOIDS, INTERNAL 07/16/2008  . DIVERTICULOSIS OF COLON 07/16/2008    Past Surgical History:  Procedure Laterality Date  . ABDOMINAL AORTOGRAM W/LOWER EXTREMITY Bilateral 01/08/2020   Procedure: ABDOMINAL AORTOGRAM W/LOWER EXTREMITY;  Surgeon: Marty Heck, MD;   Location: [facility];  Service: Cardiovascular;  Laterality: Bilateral;  . BACK SURGERY    . BREAST EXCISIONAL BIOPSY Right   . CERVICAL SPINE SURGERY    . CESAREAN SECTION  12/10/89  . CHOLECYSTECTOMY  1991  . LUMBAR SPINE SURGERY    . PERIPHERAL VASCULAR INTERVENTION Left 01/08/2020   Procedure: PERIPHERAL VASCULAR INTERVENTION;  Surgeon: Marty Heck, MD;  Location: [facility];  Service: Cardiovascular;  Laterality: Left;  . RIGHT HEART CATH N/A 12/06/2016   Procedure: Right Heart Cath;  Surgeon: Larey Dresser, MD;  Location: [facility];  Service: Cardiovascular;  Laterality: N/A;  . TUBAL LIGATION       OB History    Gravida  1   Para  1   Term      Preterm      AB      Living  1     SAB      TAB      Ectopic      Multiple      Live Births              Family History  Problem Relation Age of Onset  . Heart failure Mother   . Renal Disease Mother     Social History   Tobacco Use  . Smoking status: Former Smoker    Types: Cigarettes    Quit date: 09/04/1998    Years since quitting: 21.3  . Smokeless tobacco: Never Used  Substance Use Topics  . Alcohol use: Yes    Comment: occasional  . Drug use: No    Home Medications Prior to Admission medications   Medication Sig Start Date End Date Taking? Authorizing Provider  acetaminophen (TYLENOL) 500 MG tablet Take 500-1,000 mg by mouth every 6 (six) hours as needed for mild pain or headache.     [provider]  albuterol (PROVENTIL HFA;VENTOLIN HFA) 108 (90 BASE) MCG/ACT inhaler Inhale 2 puffs into the lungs every 6 (six) hours as needed for wheezing or shortness of breath.     Robyn Haber, MD  [facility] P 0.15 % ophthalmic solution Place 1 drop into both eyes 2 (two) times daily.  07/03/19   [provider]  aspirin EC 81 MG tablet Take 81 mg by mouth daily with breakfast.    [provider]  atorvastatin (LIPITOR) 40 MG tablet Take 1  tablet (40 mg total) by mouth daily. 01/09/20 02/08/20  Donne Hazel, MD  clopidogrel (PLAVIX) 75 MG tablet Take 1 tablet (75 mg total) by mouth daily. 01/10/20 02/09/20  Donne Hazel, MD  colchicine 0.6 MG tablet Take 0.6 mg by mouth daily.    [provider]  dorzolamide-timolol (COSOPT) 22.3-6.8 MG/ML ophthalmic solution Place 1 drop into both eyes 2 (two) times daily.    [provider]  doxazosin (CARDURA) 4 MG tablet Take 4 mg by mouth at bedtime.    [provider]  insulin aspart (NOVOLOG FLEXPEN) 100 UNIT/ML FlexPen Inject 3-20 Units into the skin 3 (three) times daily as needed for high blood sugar (CBG 150).    [provider]  insulin glargine (LANTUS) 100 unit/mL SOPN Inject 50 Units into the skin daily before breakfast.    [provider]  liraglutide (VICTOZA) 18 MG/3ML SOPN Inject 1.8 mg into the skin daily.     [provider]  lisinopril (PRINIVIL,ZESTRIL) 5 MG tablet Take 5 mg by mouth daily with breakfast.  06/18/17   [provider]  oxyCODONE-acetaminophen (PERCOCET/ROXICET) 5-325 MG tablet Take 1-2 tablets by mouth every 6 (six) hours as needed for moderate pain. 01/09/20   Donne Hazel, MD  prednisoLONE acetate (PRED FORTE) 1 % ophthalmic suspension Place 1 drop into the right eye in the morning and at bedtime. 01/01/20   [provider]  PRESCRIPTION MEDICATION Inhale into the lungs at bedtime. CPAP    [provider]  torsemide (DEMADEX) 20 MG tablet Take 3 tablets (60 mg total) by mouth as directed. Take 60 mg in the AM. You may take 60 mg in the PM as needed for swelling Patient taking differently: Take 40 mg by mouth daily.  12/04/17   Josue Hector, MD  Travoprost, BAK Free, (TRAVATAN) 0.004 % SOLN ophthalmic solution Place 1 drop into both eyes at bedtime.    [provider]  valACYclovir (VALTREX) 1000 MG tablet Take 1,000 mg by mouth 3 (three) times daily. 09/29/19   [provider]  Vitamin D, Ergocalciferol, (DRISDOL) 50000 units CAPS capsule Take 1 capsule (50,000 Units total) by mouth every 7 (seven) days. Patient not taking: Reported on 01/05/2020 09/13/17   Adora Fridge, RPH-CPP    Allergies    Pork allergy and Pork-derived products  Review of Systems   Review of Systems  Respiratory: Positive for cough and shortness of breath.   Cardiovascular: Positive for leg swelling.  Neurological: Positive for weakness.  All other systems reviewed and are negative.   Physical Exam Updated Vital Signs BP (!) 145/66   Pulse 77   Temp 98.1 F (36.7 C) (Oral)   Resp 14   LMP 02/16/2017 (Within Weeks) Comment: tubal ligation  SpO2 98%   Physical Exam Vitals and nursing note reviewed.  Constitutional:      General: She is not in acute distress.    Appearance: She is well-developed. She is obese. She is ill-appearing.     Comments: Appears ill and tired.   HENT:     Head: Normocephalic and atraumatic.  Eyes:     Conjunctiva/sclera: Conjunctivae normal.     Pupils: Pupils are equal, round, and reactive to light.  Cardiovascular:     Rate and Rhythm: Normal rate and regular rhythm.     Pulses: Normal pulses.  Pulmonary:     Effort: Pulmonary effort is normal. Tachypnea present. No respiratory distress.     Breath sounds: Rhonchi present. No wheezing.     Comments: Tachypneic in the mid to upper 20s.  Sats mid 90s on 2 L of oxygen.  Rhonchi in bilateral mid to lower lobes Abdominal:     General: There is no distension.     Palpations: Abdomen is soft. There is no mass.     Tenderness: There is no abdominal tenderness. There is no guarding or rebound.     Comments: Stent surgical site of the right inguinal groin without tenderness, erythema, warmth, or signs of infection  Musculoskeletal:  General: Normal range of motion.     Cervical back: Normal range of motion and neck supple.     Right lower leg: Edema present.     Left lower  leg: Edema present.     Comments: Bilateral pitting edema, right greater than left (baseline per pt)  Skin:    General: Skin is warm and dry.     Capillary Refill: Capillary refill takes less than 2 seconds.  Neurological:     Mental Status: She is alert and oriented to person, place, and time.     ED Results / Procedures / Treatments   Labs (all labs ordered are listed, but only abnormal results are displayed) Labs Reviewed  BASIC METABOLIC PANEL - Abnormal; Notable for the following components:      Result Value   CO2 19 (*)    Glucose, Bld 238 (*)    BUN 40 (*)    Creatinine, Ser 2.04 (*)    Calcium 7.9 (*)    GFR calc non Af Amer 25 (*)    GFR calc Af Amer 29 (*)    All other components within normal limits  CBC - Abnormal; Notable for the following components:   RBC 3.24 (*)    Hemoglobin 9.8 (*)    HCT 30.7 (*)    RDW 17.1 (*)    Platelets 110 (*)    All other components within normal limits  POC SARS CORONAVIRUS 2 AG -  ED - Abnormal; Notable for the following components:   SARS Coronavirus 2 Ag POSITIVE (*)    All other components within normal limits  TROPONIN I (HIGH SENSITIVITY) - Abnormal; Notable for the following components:   Troponin I (High Sensitivity) 49 (*)    All other components within normal limits  TROPONIN I (HIGH SENSITIVITY) - Abnormal; Notable for the following components:   Troponin I (High Sensitivity) 50 (*)    All other components within normal limits  RESPIRATORY PANEL BY RT PCR (FLU A&B, COVID)  CULTURE, BLOOD (ROUTINE X 2)  CULTURE, BLOOD (ROUTINE X 2)  BRAIN NATRIURETIC PEPTIDE    EKG EKG Interpretation  Date/Time:  Monday January 12 2020 23:42:20 EDT Ventricular Rate:  82 PR Interval:  116 QRS Duration: 76 QT Interval:  350 QTC Calculation: 408 R Axis:   -51 Text Interpretation: Normal sinus rhythm Left anterior fascicular block Confirmed by Randal Buba, April ([id number]) on 01/13/2020 9:55:17 AM   Radiology DG Chest 2  View  Result Date: 01/13/2020 CLINICAL DATA:  Shortness of breath and bilateral leg swelling. EXAM: CHEST - 2 VIEW COMPARISON:  November 30, 2016 FINDINGS: Moderate severity diffuse multifocal infiltrates are seen, bilaterally. There is no evidence of a pleural effusion or pneumothorax. The cardiac silhouette is mildly enlarged. A radiopaque fusion plate and screws are seen overlying the lower cervical spine. The visualized skeletal structures are otherwise unremarkable. Radiopaque surgical clips are seen within the right upper quadrant. IMPRESSION: Moderate severity bilateral multifocal infiltrates. Electronically Signed   By: Virgina Norfolk M.D.   On: 01/13/2020 00:27    Procedures Procedures (including critical care time)  Medications Ordered in ED Medications  sodium chloride flush (NS) 0.9 % injection 3 mL (has no administration in time range)  ceFEPIme (MAXIPIME) 2 g in sodium chloride 0.9 % 100 mL IVPB (2 g Intravenous New Bag/Given 01/13/20 1117)  vancomycin (VANCOREADY) IVPB 2000 mg/400 mL (has no administration in time range)  vancomycin (VANCOREADY) IVPB 1250 mg/250 mL (has no administration in time range)  acetaminophen (TYLENOL) tablet 650 mg (650 mg Oral Given 01/13/20 0006)    ED Course  I have reviewed the triage vital signs and the nursing notes.  Pertinent labs & imaging results that were available during my care of the patient were reviewed by me and considered in my medical decision making (see chart for details).    MDM Rules/Calculators/A&P                      Patient presenting for evaluation of shortness of breath.  On exam, patient appears ill.  She is tachypneic, rhonchi bilateral fields, and is requiring oxygen.  I saw patient approximately 10 hours into her ER stay.  Labs obtained from triage interpreted by me, creatinine close to baseline. Trop mildly elevated x2, stable. History and exam not c/w with ACS, likely demand. Also consider trop elevated due to h/o CKF  and CKD. cxr viewed and interpreted by me, shows bilateral patchy infiltrates.  EKG without STEMI.  I am concerned for viral versus bacterial pneumonia with an oxygen demand.  Will obtain Covid test, cover with antibiotics, and call for admission.  Discussed with Dr. Tamala Julian from tried hospitalist service, patient be admitted.  Requesting blood cultures and BNP.  Informed by RN that patient's Covid test was positive. Will defer further abx management to hospitalist.   Final Clinical Impression(s) / ED Diagnoses Final diagnoses:  COVID-19  Pneumonia due to COVID-19 virus    Rx / DC Orders ED Discharge Orders    None       Franchot Heidelberg, PA-C 01/13/20 1120    Maudie Flakes, MD 01/14/20 [phone]

## 2020-01-13 NOTE — Progress Notes (Signed)
Bilateral lower extremity venous duplex complete.  Please see CV Proc tab for preliminary results. Lita Mains- RDMS, RVT 6:28 PM  01/13/2020

## 2020-01-13 NOTE — ED Notes (Signed)
Sister-- Sherrian Divers -- [phone]  Emergency Contact---

## 2020-01-13 NOTE — ED Notes (Signed)
Admitting paged to notify of pt elevated d-dimer.

## 2020-01-14 LAB — COMPREHENSIVE METABOLIC PANEL
ALT: 34 U/L (ref 0–44)
AST: 62 U/L — ABNORMAL HIGH (ref 15–41)
Albumin: 2.3 g/dL — ABNORMAL LOW (ref 3.5–5.0)
Alkaline Phosphatase: 38 U/L (ref 38–126)
Anion gap: 12 (ref 5–15)
BUN: 44 mg/dL — ABNORMAL HIGH (ref 8–23)
CO2: 19 mmol/L — ABNORMAL LOW (ref 22–32)
Calcium: 8.1 mg/dL — ABNORMAL LOW (ref 8.9–10.3)
Chloride: 109 mmol/L (ref 98–111)
Creatinine, Ser: 2.08 mg/dL — ABNORMAL HIGH (ref 0.44–1.00)
GFR calc Af Amer: 29 mL/min — ABNORMAL LOW (ref >60)
GFR calc non Af Amer: 25 mL/min — ABNORMAL LOW (ref >60)
Glucose, Bld: 196 mg/dL — ABNORMAL HIGH (ref 70–99)
Potassium: 4.5 mmol/L (ref 3.5–5.1)
Sodium: 140 mmol/L (ref 135–145)
Total Bilirubin: 0.8 mg/dL (ref 0.3–1.2)
Total Protein: 5.6 g/dL — ABNORMAL LOW (ref 6.5–8.1)

## 2020-01-14 LAB — MAGNESIUM: Magnesium: 1.6 mg/dL — ABNORMAL LOW (ref 1.7–2.4)

## 2020-01-14 LAB — FERRITIN: Ferritin: 1474 ng/mL — ABNORMAL HIGH (ref 11–307)

## 2020-01-14 LAB — CBC WITH DIFFERENTIAL/PLATELET
Abs Immature Granulocytes: 0.03 10*3/uL (ref 0.00–0.07)
Basophils Absolute: 0 10*3/uL (ref 0.0–0.1)
Basophils Relative: 0 %
Eosinophils Absolute: 0 10*3/uL (ref 0.0–0.5)
Eosinophils Relative: 0 %
HCT: 31.4 % — ABNORMAL LOW (ref 36.0–46.0)
Hemoglobin: 10 g/dL — ABNORMAL LOW (ref 12.0–15.0)
Immature Granulocytes: 1 %
Lymphocytes Relative: 11 %
Lymphs Abs: 0.5 10*3/uL — ABNORMAL LOW (ref 0.7–4.0)
MCH: 30.1 pg (ref 26.0–34.0)
MCHC: 31.8 g/dL (ref 30.0–36.0)
MCV: 94.6 fL (ref 80.0–100.0)
Monocytes Absolute: 0.2 10*3/uL (ref 0.1–1.0)
Monocytes Relative: 4 %
Neutro Abs: 4.3 10*3/uL (ref 1.7–7.7)
Neutrophils Relative %: 84 %
Platelets: 143 10*3/uL — ABNORMAL LOW (ref 150–400)
RBC: 3.32 MIL/uL — ABNORMAL LOW (ref 3.87–5.11)
RDW: 17.1 % — ABNORMAL HIGH (ref 11.5–15.5)
WBC: 5.1 10*3/uL (ref 4.0–10.5)
nRBC: 0 % (ref 0.0–0.2)

## 2020-01-14 LAB — GLUCOSE, CAPILLARY
Glucose-Capillary: 170 mg/dL — ABNORMAL HIGH (ref 70–99)
Glucose-Capillary: 177 mg/dL — ABNORMAL HIGH (ref 70–99)
Glucose-Capillary: 264 mg/dL — ABNORMAL HIGH (ref 70–99)

## 2020-01-14 LAB — CBG MONITORING, ED
Glucose-Capillary: 144 mg/dL — ABNORMAL HIGH (ref 70–99)
Glucose-Capillary: 160 mg/dL — ABNORMAL HIGH (ref 70–99)
Glucose-Capillary: 163 mg/dL — ABNORMAL HIGH (ref 70–99)
Glucose-Capillary: 175 mg/dL — ABNORMAL HIGH (ref 70–99)
Glucose-Capillary: 188 mg/dL — ABNORMAL HIGH (ref 70–99)

## 2020-01-14 LAB — PHOSPHORUS: Phosphorus: 4.6 mg/dL (ref 2.5–4.6)

## 2020-01-14 LAB — D-DIMER, QUANTITATIVE: D-Dimer, Quant: 3.34 ug/mL-FEU — ABNORMAL HIGH (ref 0.00–0.50)

## 2020-01-14 LAB — ABO/RH: ABO/RH(D): B POS

## 2020-01-14 LAB — C-REACTIVE PROTEIN: CRP: 21.2 mg/dL — ABNORMAL HIGH (ref <1.0)

## 2020-01-14 MED ORDER — MAGNESIUM SULFATE 2 GM/50ML IV SOLN
2.0000 g | Freq: Once | INTRAVENOUS | Status: AC
  Administered 2020-01-14: 2 g via INTRAVENOUS
  Filled 2020-01-14: qty 50

## 2020-01-14 MED ORDER — INSULIN GLARGINE 100 UNIT/ML ~~LOC~~ SOLN
22.0000 [IU] | Freq: Two times a day (BID) | SUBCUTANEOUS | Status: DC
  Administered 2020-01-14 – 2020-01-15 (×2): 22 [IU] via SUBCUTANEOUS
  Filled 2020-01-14 (×4): qty 0.22

## 2020-01-14 MED ORDER — METHYLPREDNISOLONE SODIUM SUCC 40 MG IJ SOLR
40.0000 mg | Freq: Two times a day (BID) | INTRAMUSCULAR | Status: DC
  Administered 2020-01-14 – 2020-01-21 (×15): 40 mg via INTRAVENOUS
  Filled 2020-01-14 (×16): qty 1

## 2020-01-14 MED ORDER — INSULIN ASPART 100 UNIT/ML ~~LOC~~ SOLN
0.0000 [IU] | Freq: Three times a day (TID) | SUBCUTANEOUS | Status: DC
  Administered 2020-01-14 (×2): 4 [IU] via SUBCUTANEOUS
  Administered 2020-01-15 (×2): 7 [IU] via SUBCUTANEOUS
  Administered 2020-01-15 – 2020-01-16 (×2): 4 [IU] via SUBCUTANEOUS
  Administered 2020-01-16 (×2): 7 [IU] via SUBCUTANEOUS
  Administered 2020-01-17: 3 [IU] via SUBCUTANEOUS
  Administered 2020-01-17: 4 [IU] via SUBCUTANEOUS
  Administered 2020-01-17: 3 [IU] via SUBCUTANEOUS
  Administered 2020-01-18 – 2020-01-19 (×4): 4 [IU] via SUBCUTANEOUS
  Administered 2020-01-19: 7 [IU] via SUBCUTANEOUS
  Administered 2020-01-19: 4 [IU] via SUBCUTANEOUS
  Administered 2020-01-20: 11 [IU] via SUBCUTANEOUS
  Administered 2020-01-20: 3 [IU] via SUBCUTANEOUS
  Administered 2020-01-20: 4 [IU] via SUBCUTANEOUS
  Administered 2020-01-21: 3 [IU] via SUBCUTANEOUS
  Administered 2020-01-21: 7 [IU] via SUBCUTANEOUS
  Administered 2020-01-21: 4 [IU] via SUBCUTANEOUS
  Administered 2020-01-22: 3 [IU] via SUBCUTANEOUS

## 2020-01-14 MED ORDER — INSULIN ASPART 100 UNIT/ML ~~LOC~~ SOLN
0.0000 [IU] | Freq: Every day | SUBCUTANEOUS | Status: DC
  Administered 2020-01-14 – 2020-01-20 (×2): 3 [IU] via SUBCUTANEOUS
  Administered 2020-01-21: 2 [IU] via SUBCUTANEOUS
  Administered 2020-01-23: 3 [IU] via SUBCUTANEOUS
  Administered 2020-01-25: 2 [IU] via SUBCUTANEOUS

## 2020-01-14 MED ORDER — ENOXAPARIN SODIUM 60 MG/0.6ML ~~LOC~~ SOLN
60.0000 mg | SUBCUTANEOUS | Status: DC
  Administered 2020-01-14 – 2020-01-15 (×2): 60 mg via SUBCUTANEOUS
  Filled 2020-01-14 (×2): qty 0.6

## 2020-01-14 MED ORDER — INSULIN GLARGINE 100 UNIT/ML ~~LOC~~ SOLN
17.0000 [IU] | Freq: Two times a day (BID) | SUBCUTANEOUS | Status: DC
  Administered 2020-01-14: 17 [IU] via SUBCUTANEOUS
  Filled 2020-01-14 (×2): qty 0.17

## 2020-01-14 NOTE — Progress Notes (Addendum)
PROGRESS NOTE                                                                                                                                                                                                             Patient Demographics:    [name], is a 64 y.o. female, DOB - [birth year], TD:[id number]  Outpatient Primary MD for the patient is Pavelock, Ralene Bathe, MD   Admit date - 01/10/2020   LOS - 1  Chief Complaint  Patient presents with  . Shortness of Breath  . Chest Pain       Brief Narrative: Patient is a 64 y.o. female  PMHx of hypertension, HLD, DM-2, CKD stage IV, PVD s/p stent to left distal SFA on [id number], chronic diastolic heart failure-presented with shortness of breath or cough-found to have acute hypoxic respiratory failure secondary to COVID-19 pneumonia and decompensated diastolic heart failure.  Significant Events: 4/18-4/23>> admit to [facility]-s/p stent to left distal SFA 4/27>> admit to [facility] for hypoxia from Covid/heart failure  COVID-19 medications: Steroids: 4/27>> Remdesivir: 4/27>>  Antibiotics: Vancomycin: 4/27 x 1 Cefepime: 4/27 x 1  Microbiology data: 4/27: Blood culture>> no growth 4/28: Blood culture>> pending  DVT prophylaxis: SQ Lovenox  Procedures: None  Consults: None    Subjective:    [name] today    Assessment  & Plan :   Acute Hypoxic Resp Failure due to Covid 19 Viral pneumonia and decompensated diastolic heart failure: On 2 L of oxygen-appears comfortable-continue steroids/remdesivir and and diuretics.  Monitor closely-follow clinical trajectory.  COVID-19 pneumonia: Remains stable-however CRP remains significantly elevated-only requiring around 2 L of oxygen.  Continue steroids but switch to Solu-Medrol-remains on remdesivir.  If worsens-we will need to discuss about Actemra.  Fever: afebrile  O2 requirements:  SpO2: 91 % O2 Flow Rate (L/min):  2 L/min   COVID-19 Labs: Recent Labs    01/13/20 2200 01/14/20 0307  DDIMER 3.70* 3.34*  FERRITIN 1,334* 1,474*  LDH 627*  --   CRP 21.7* 21.2*       Component Value Date/Time   BNP 193.8 (H) 01/13/2020 0002    Recent Labs  Lab 01/13/20 2200  PROCALCITON 0.21    Lab Results  Component Value Date   SARSCOV2NAA POSITIVE (A) 01/13/2020   [place] NEGATIVE 01/05/2020   [place] Not Detected 11/24/2019  Prone/Incentive Spirometry: encouraged  incentive spirometry use 3-4/hour.  Elevated troponin: Trend is flat-not consistent with ACS-likely secondary to underlying CKD-with some component from demand ischemia due to COVID-19/heart failure.  Decompensated diastolic heart failure: Remains volume overloaded-2+ pitting edema-continue IV Lasix-follow weights/electrolytes and urine output.  PAD-s/p stent to left SFA on 4/22: Both lower extremities appear warm-continue aspirin, Plavix and statin.  HTN: BP stable-continue Cardura and Lasix-lisinopril on hold  CKD stage IV: Creatinine close to usual baseline-follow closely while on diuretics.  DM-2 with hyperglycemia (A1c 12.2 on 4/19): CBGs relatively stable but on higher dosing of steroids-suspect will have episodes of hypoglycemia-we will go and increase Lantus to 22 units twice daily-continue SSI and monitor closely.  Recent Labs    01/14/20 0337 01/14/20 0535 01/14/20 0741  GLUCAP 175* 160* 144*    Thrombocytopenia: Appears to be mild-in a chronic issue-stable to be followed up closely.  Anemia of chronic disease: New baseline-likely related to CKD.  Gout: No flare-continue colchicine  OSA: CPAP nightly  Obesity: Estimated body mass index is 48.15 kg/m as calculated from the following:   Height as of 01/08/20: 5\' 1"  (1.549 m).   Weight as of 01/08/20: 115.6 kg.   ABG:    Component Value Date/Time   PHART 7.460 (H) 09/30/2008 1120   PCO2ART 41.0 09/30/2008 1120   PO2ART 83.0 09/30/2008 1120   HCO3  26.4 12/06/2016 0922   HCO3 26.1 12/06/2016 0922   TCO2 28 12/06/2016 0922   TCO2 28 12/06/2016 0922   O2SAT 69.0 12/06/2016 0922   O2SAT 67.0 12/06/2016 0922    Vent Settings: N/A  Condition - Extremely Guarded  Family Communication  : Daughter updated over the phone  Code Status :  Full Code  Diet :  Diet Order            Diet heart healthy/carb modified Room service appropriate? Yes; Fluid consistency: Thin; Fluid restriction: 2000 mL Fluid  Diet effective now               Disposition Plan  :   Status is: Inpatient  Remains inpatient appropriate because:Inpatient level of care appropriate due to severity of illness   Dispo: The patient is from: Home              Anticipated d/c is to: Home              Anticipated d/c date is: > 3 days              Patient currently is not medically stable to d/c.  Barriers to discharge: Hypoxia requiring O2 supplementation/complete 5 days of IV Remdesivir  Antimicorbials  :    Anti-infectives (From admission, onward)   Start     Dose/Rate Route Frequency Ordered Stop   01/15/20 1200  vancomycin (VANCOREADY) IVPB 1250 mg/250 mL  Status:  Discontinued     1,250 mg 166.7 mL/hr over 90 Minutes Intravenous Every 48 hours 01/13/20 1018 01/14/20 1038   01/14/20 1000  remdesivir 100 mg in sodium chloride 0.9 % 100 mL IVPB     100 mg 200 mL/hr over 30 Minutes Intravenous Daily 01/13/20 1129 01/18/20 0959   01/13/20 1630  valACYclovir (VALTREX) tablet 1,000 mg     1,000 mg Oral 3 times daily 01/13/20 1557     01/13/20 1230  remdesivir 200 mg in sodium chloride 0.9% 250 mL IVPB     200 mg 580 mL/hr over 30 Minutes Intravenous Once 01/13/20 1129 01/13/20 1800  01/13/20 1015  ceFEPIme (MAXIPIME) 2 g in sodium chloride 0.9 % 100 mL IVPB     2 g 200 mL/hr over 30 Minutes Intravenous  Once 01/13/20 1003 01/13/20 1434   01/13/20 1015  vancomycin (VANCOREADY) IVPB 2000 mg/400 mL     2,000 mg 200 mL/hr over 120 Minutes Intravenous  Once  01/13/20 1012 01/13/20 2258      Inpatient Medications  Scheduled Meds: . vitamin C  500 mg Oral Daily  . aspirin EC  81 mg Oral Q breakfast  . atorvastatin  40 mg Oral Daily  . brimonidine  1 drop Both Eyes BID  . clopidogrel  75 mg Oral Daily  . colchicine  0.6 mg Oral Daily  . dexamethasone  6 mg Oral Q24H  . dorzolamide-timolol  1 drop Both Eyes BID  . doxazosin  4 mg Oral QHS  . famotidine  20 mg Oral Daily  . furosemide  80 mg Intravenous BID  . insulin aspart  0-20 Units Subcutaneous Q4H  . insulin glargine  17 Units Subcutaneous BID  . Ipratropium-Albuterol  1 puff Inhalation Q6H  . latanoprost  1 drop Both Eyes QHS  . linagliptin  5 mg Oral Daily  . prednisoLONE acetate  1 drop Right Eye BID  . sodium chloride flush  3 mL Intravenous Once  . valACYclovir  1,000 mg Oral TID  . zinc sulfate  220 mg Oral Daily   Continuous Infusions: . remdesivir 100 mg in NS 100 mL Stopped (01/14/20 1003)   PRN Meds:.acetaminophen, chlorpheniramine-HYDROcodone, guaiFENesin-dextromethorphan, ondansetron **OR** ondansetron (ZOFRAN) IV, oxyCODONE-acetaminophen   Time Spent in minutes  35  See all Orders from today for further details   Oren Binet M.D on 01/14/2020 at 10:52 AM  To page go to [email or web address] - use universal password  [facility] -  Office  [phone]    Objective:   Vitals:   01/14/20 0845 01/14/20 0930 01/14/20 1000 01/14/20 1037  BP: (!) 117/58 (!) 157/67 140/60 (!) 155/60  Pulse: 65 64 (!) 58 62  Resp: 16 20 (!) 23 20  Temp:   98.4 F (36.9 C) 98.2 F (36.8 C)  TempSrc:   Oral Oral  SpO2: 99% 92% 99% 91%    Wt Readings from Last 3 Encounters:  01/08/20 115.6 kg  07/15/19 102.9 kg  12/12/18 103.4 kg     Intake/Output Summary (Last 24 hours) at 01/14/2020 1052 Last data filed at 01/14/2020 1001 Gross per 24 hour  Intake 840 ml  Output 2050 ml  Net -1210 ml    Physical Exam Gen Exam:Alert awake-not in any  distress HEENT:atraumatic, normocephalic Chest: Bibasilar rales CVS:S1S2 regular Abdomen:soft non tender, non distended Extremities:++ edema Neurology: Non focal Skin: no rash   Data Review:    CBC Recent Labs  Lab 01/08/20 0303 01/09/20 0308 01/13/20 0002 01/14/20 0307  WBC 3.5* 4.2 5.3 5.1  HGB 9.1* 9.7* 9.8* 10.0*  HCT 28.8* 30.8* 30.7* 31.4*  PLT 93* 83* 110* 143*  MCV 96.0 96.0 94.8 94.6  MCH 30.3 30.2 30.2 30.1  MCHC 31.6 31.5 31.9 31.8  RDW 17.5* 17.3* 17.1* 17.1*  LYMPHSABS 0.7  --   --  0.5*  MONOABS 0.8  --   --  0.2  EOSABS 0.1  --   --  0.0  BASOSABS 0.0  --   --  0.0    Chemistries  Recent Labs  Lab 01/08/20 0303 01/09/20 0308 01/13/20 0002 01/14/20 0307  NA 136 135 135 140  K 4.6 4.8 4.2 4.5  CL 112* 110 106 109  CO2 17* 16* 19* 19*  GLUCOSE 113* 267* 238* 196*  BUN 40* 37* 40* 44*  CREATININE 1.92* 1.87* 2.04* 2.08*  CALCIUM 8.2* 8.2* 7.9* 8.1*  MG  --   --   --  1.6*  AST  --   --   --  62*  ALT  --   --   --  34  ALKPHOS  --   --   --  38  BILITOT  --   --   --  0.8   ------------------------------------------------------------------------------------------------------------------ No results for input(s): CHOL, HDL, LDLCALC, TRIG, CHOLHDL, LDLDIRECT in the last 72 hours.  Lab Results  Component Value Date   HGBA1C 12.2 (H) 01/05/2020   ------------------------------------------------------------------------------------------------------------------ No results for input(s): TSH, T4TOTAL, T3FREE, THYROIDAB in the last 72 hours.  Invalid input(s): FREET3 ------------------------------------------------------------------------------------------------------------------ Recent Labs    01/13/20 2200 01/14/20 0307  FERRITIN 1,334* 1,474*    Coagulation profile No results for input(s): INR, PROTIME in the last 168 hours.  Recent Labs    01/13/20 2200 01/14/20 0307  DDIMER 3.70* 3.34*    Cardiac Enzymes No results for  input(s): CKMB, TROPONINI, MYOGLOBIN in the last 168 hours.  Invalid input(s): CK ------------------------------------------------------------------------------------------------------------------    Component Value Date/Time   BNP 193.8 (H) 01/13/2020 0002    Micro Results Recent Results (from the past 240 hour(s))  Blood culture (routine x 2)     Status: None   Collection Time: 01/04/20 10:30 PM   Specimen: BLOOD  Result Value Ref Range Status   Specimen Description BLOOD LEFT ANTECUBITAL  Final   Special Requests   Final    BOTTLES DRAWN AEROBIC AND ANAEROBIC Blood Culture adequate volume   Culture   Final    NO GROWTH 5 DAYS Performed at [facility], [address] [place], [place] [address]    Report Status 01/09/2020 FINAL  Final  Blood culture (routine x 2)     Status: None   Collection Time: 01/04/20 10:30 PM   Specimen: BLOOD  Result Value Ref Range Status   Specimen Description BLOOD RIGHT ANTECUBITAL  Final   Special Requests   Final    BOTTLES DRAWN AEROBIC AND ANAEROBIC Blood Culture results may not be optimal due to an excessive volume of blood received in culture bottles   Culture   Final    NO GROWTH 5 DAYS Performed at [facility], [place] [address] [place], [place] [address]    Report Status 01/09/2020 FINAL  Final  SARS CORONAVIRUS 2 (TAT 6-24 HRS) Nasopharyngeal Nasopharyngeal Swab     Status: None   Collection Time: 01/05/20 12:57 AM   Specimen: Nasopharyngeal Swab  Result Value Ref Range Status   SARS Coronavirus 2 NEGATIVE NEGATIVE Final    Comment: (NOTE) SARS-CoV-2 target nucleic acids are NOT DETECTED. The SARS-CoV-2 RNA is generally detectable in upper and lower respiratory specimens during the acute phase of infection. Negative results do not preclude SARS-CoV-2 infection, do not rule out co-infections with other pathogens, and should not be used as the sole basis for treatment or other patient management decisions. Negative  results must be combined with clinical observations, patient history, and epidemiological information. The expected result is Negative. Fact Sheet for Patients: SugarRoll.be Fact Sheet for Healthcare Providers: https://www.woods-mathews.com/ This test is not yet approved or cleared by the [place] FDA and  has been authorized for detection and/or diagnosis of SARS-CoV-2 by  FDA under an Emergency Use Authorization (EUA). This EUA will remain  in effect (meaning this test can be used) for the duration of the COVID-19 declaration under Section 56 4(b)(1) of the Act, 21 U.S.C. section 360bbb-3(b)(1), unless the authorization is terminated or revoked sooner. Performed at [facility], [place] [address] [place], [place] [address]   Culture, Urine     Status: Abnormal   Collection Time: 01/06/20  7:48 AM   Specimen: Urine, Random  Result Value Ref Range Status   Specimen Description URINE, RANDOM  Final   Special Requests   Final    NONE Performed at [facility], [place] [address] [place], [place] [address]    Culture >=100,000 COLONIES/mL ESCHERICHIA COLI (A)  Final   Report Status 01/08/2020 FINAL  Final   Organism ID, Bacteria ESCHERICHIA COLI (A)  Final      Susceptibility   Escherichia coli - MIC*    AMPICILLIN >=32 RESISTANT Resistant     CEFAZOLIN <=4 SENSITIVE Sensitive     CEFTRIAXONE <=0.25 SENSITIVE Sensitive     CIPROFLOXACIN <=0.25 SENSITIVE Sensitive     GENTAMICIN <=1 SENSITIVE Sensitive     IMIPENEM <=0.25 SENSITIVE Sensitive     NITROFURANTOIN <=16 SENSITIVE Sensitive     TRIMETH/SULFA <=20 SENSITIVE Sensitive     AMPICILLIN/SULBACTAM >=32 RESISTANT Resistant     PIP/TAZO 8 SENSITIVE Sensitive     * >=100,000 COLONIES/mL ESCHERICHIA COLI  Respiratory Panel by RT PCR (Flu A&B, Covid) - Nasopharyngeal Swab     Status: Abnormal   Collection Time: 01/13/20 10:25 AM   Specimen: Nasopharyngeal Swab  Result Value  Ref Range Status   SARS Coronavirus 2 by RT PCR POSITIVE (A) NEGATIVE Final    Comment: RESULT CALLED TO, READ BACK BY AND VERIFIED WITH: Sheran Lawless RN 12:25 01/13/20 ([email or web address]) (NOTE) SARS-CoV-2 target nucleic acids are DETECTED. SARS-CoV-2 RNA is generally detectable in upper respiratory specimens  during the acute phase of infection. Positive results are indicative of the presence of the identified virus, but do not rule out bacterial infection or co-infection with other pathogens not detected by the test. Clinical correlation with patient history and other diagnostic information is necessary to determine patient infection status. The expected result is Negative. Fact Sheet for Patients:  PinkCheek.be Fact Sheet for Healthcare Providers: GravelBags.it This test is not yet approved or cleared by the [place] FDA and  has been authorized for detection and/or diagnosis of SARS-CoV-2 by FDA under an Emergency Use Authorization (EUA).  This EUA will remain in effect (meaning this test can be used)  for the duration of  the COVID-19 declaration under Section 564(b)(1) of the Act, 21 U.S.C. section 360bbb-3(b)(1), unless the authorization is terminated or revoked sooner.    Influenza A by PCR NEGATIVE NEGATIVE Final   Influenza B by PCR NEGATIVE NEGATIVE Final    Comment: (NOTE) The Xpert Xpress SARS-CoV-2/FLU/RSV assay is intended as an aid in  the diagnosis of influenza from Nasopharyngeal swab specimens and  should not be used as a sole basis for treatment. Nasal washings and  aspirates are unacceptable for Xpert Xpress SARS-CoV-2/FLU/RSV  testing. Fact Sheet for Patients: PinkCheek.be Fact Sheet for Healthcare Providers: GravelBags.it This test is not yet approved or cleared by the [place] FDA and  has been authorized for detection and/or diagnosis of  SARS-CoV-2 by  FDA under an Emergency Use Authorization (EUA). This EUA will remain  in effect (meaning this test can be used)  for the duration of the  Covid-19 declaration under Section 564(b)(1) of the Act, 21  U.S.C. section 360bbb-3(b)(1), unless the authorization is  terminated or revoked. Performed at [facility], [place] [address], [place], [place] [address]   Blood culture (routine x 2)     Status: None (Preliminary result)   Collection Time: 01/13/20 11:10 AM   Specimen: BLOOD  Result Value Ref Range Status   Specimen Description BLOOD LEFT ANTECUBITAL  Final   Special Requests   Final    BOTTLES DRAWN AEROBIC AND ANAEROBIC Blood Culture results may not be optimal due to an inadequate volume of blood received in culture bottles   Culture   Final    NO GROWTH < 24 HOURS Performed at [facility], [place] [address], [place], [place] [address]    Report Status PENDING  Incomplete    Radiology Reports DG Chest 2 View  Result Date: 01/13/2020 CLINICAL DATA:  Shortness of breath and bilateral leg swelling. EXAM: CHEST - 2 VIEW COMPARISON:  November 30, 2016 FINDINGS: Moderate severity diffuse multifocal infiltrates are seen, bilaterally. There is no evidence of a pleural effusion or pneumothorax. The cardiac silhouette is mildly enlarged. A radiopaque fusion plate and screws are seen overlying the lower cervical spine. The visualized skeletal structures are otherwise unremarkable. Radiopaque surgical clips are seen within the right upper quadrant. IMPRESSION: Moderate severity bilateral multifocal infiltrates. Electronically Signed   By: Virgina Norfolk M.D.   On: 01/13/2020 00:27   US RENAL  Result Date: 01/06/2020 CLINICAL DATA:  Chronic kidney disease EXAM: RENAL / URINARY TRACT ULTRASOUND COMPLETE COMPARISON:  None. FINDINGS: Right Kidney: Renal measurements: 9.3 x 4.3 x 4.6 cm = volume: 96 mL. Lobulated contour. No hydronephrosis. Normal echogenicity. Left Kidney: Renal  measurements: 10.7 x 5.0 x 5.2 cm = volume: 145 mL. Lobulated contour without hydronephrosis. Normal echogenicity. Bladder: Decompressed. Other: Visualization is generally limited by body habitus. IMPRESSION: No hydronephrosis. Lobulated renal contours without focal parenchymal abnormality. Electronically Signed   By: Ulyses Jarred M.D.   On: 01/06/2020 16:16   PERIPHERAL VASCULAR CATHETERIZATION  Result Date: 01/08/2020 Patient name: Cordilia Rheault   MRN: [id number]        DOB: 08/08/1956            Sex: female  01/08/2020 Pre-operative Diagnosis: Critical limb ischemia of the left lower extremity with tissue loss Post-operative diagnosis:  Same Surgeon:  Marty Heck, MD Procedure Performed: 1.  Ultrasound guided access of the right common femoral artery 2.  Aortogram with CO2 including catheter selection of the aorta 3.  Left lower extremity arteriogram with selection of third order branches 4.  Left distal SFA above-knee popliteal artery angioplasty with stent placement (primarily stented with a 6 mm x 60 mm drug-coated Eluvia postdilated with a 5 mm Mustang) 5.  Left anterior tibial angioplasty for focal perforation with resolution of the perforation (3 mm Sterling) 6.  Mynx closure of the right common femoral artery 7.  55 minutes of monitored moderate conscious sedation time   Indications: Patient is a 64 year old female who was seen in consultation by Dr. Trula Slade for left lower extremity rest pain with severely depressed ABIs and duplex showing likely popliteal disease.  She presents today for left lower extremity arteriogram after risk benefits discussed.  Findings:  Aortogram was performed with CO2 to limit contrast and there was no flow-limiting aortoiliac stenosis.  Left lower extremity arteriogram showed a patent common femoral profunda and widely patent  SFA in the proximal to mid segment.  At [facility] canal in the distal SFA and above-knee popliteal artery showed a very short focal  occlusion with large collateral around this.  She had a patent below-knee popliteal artery with three-vessel runoff.  Anterior tibial appeared to be dominant runoff.   Ultimately this lesion was crossed in the distal SFA/above knee popliteal artery and primarily stented with a 6 mm x 60 mm Eluvia.  She now has excellent inline flow down the left lower extremity with no residual stenosis and three-vessel runoff.  There was a focal perforation in the anterior tibial where our wire likely made a perforation during exchange of balloons and stents.  Ultimately this was treated with a very low inflation of a 3 mm Sterling for 3 minutes with resolution.  Patient has preserved three vessel runoff.              Procedure:  The patient was identified in the holding area and taken to room 8.  The patient was then placed supine on the table and prepped and draped in the usual sterile fashion.  A time out was called.  Ultrasound was used to evaluate the right common femoral artery.  It was patent .  A digital ultrasound image was acquired.  A micropuncture needle was used to access the right common femoral artery under ultrasound guidance.  An 018 wire was advanced without resistance and a micropuncture sheath was placed.  The 018 wire was removed and a benson wire was placed.  The micropuncture sheath was exchanged for a 5 french sheath.  An omniflush catheter was advanced over the wire to the level of L-1.  An abdominal angiogram was obtained.  Next, using glidewire and straight 4 French flush catheter the aortic bifurcation was cross because the Omni would not track.  Placed the catheter into theleft external iliac artery and left runoff was obtained.  Ultimately after evaluating the pictures elected to intervene on the distal left SFA above-knee popliteal occlusion.  Used a long Glidewire advantage down the SFA and exchanged for a 6 [place] Ansell sheath in the right groin over the aortic bifurcation.  Patient was given 100  units per kilogram heparin.  I then primarily stented this lesion with a 6 mm x 60 mm Eluvia that was postdilated with a 5 mm x 60 mm Mustang.  Final pictures showed no residual stenosis with excellent inline flow down the left lower extremity and three-vessel runoff.  There was a focal perforation in the proximal anterior tibial that I think was from our wire.  I then exchanged for a V 18 wire down the anterior tibial and then did a very slow inflation with a 3 mm Sterling in the left anterior tibial artery for 3 minutes.  There appeared to be resolution of the perforation after taking multiple additional pictures.  Wires and catheters removed and a short 6 French sheath was placed in the right groin.  We placed a mynx closure device.   Plan: Patient will be loaded on Plavix and will need to continue aspirin and Plavix.  Should have inline flow down left leg now.   Marty Heck, MD [facility] Office: [phone]   DG Foot Complete Left  Result Date: 01/05/2020 CLINICAL DATA:  Swelling EXAM: LEFT FOOT - COMPLETE 3+ VIEW COMPARISON:  None. FINDINGS: Degenerative changes in the 1st MTP joint. No acute bony abnormality. Specifically, no fracture, subluxation, or dislocation. Vascular calcifications noted.  No bone destruction seen to suggest osteomyelitis. IMPRESSION: No acute bony abnormality. Electronically Signed   By: Rolm Baptise M.D.   On: 01/05/2020 01:25   VAS [place] ABI WITH/WO TBI  Result Date: 01/05/2020 LOWER EXTREMITY DOPPLER STUDY Indications: Blue, cold toes. High Risk Factors: Hypertension, Diabetes.  Limitations: Today's exam was limited due to Patient body habitus. Comparison Study: No prior studies. Performing Technologist: Carlos Levering Rvt  Examination Guidelines: A complete evaluation includes at minimum, Doppler waveform signals and systolic blood pressure reading at the level of bilateral brachial, anterior tibial, and posterior tibial arteries,  when vessel segments are accessible. Bilateral testing is considered an integral part of a complete examination. Photoelectric Plethysmograph (PPG) waveforms and toe systolic pressure readings are included as required and additional duplex testing as needed. Limited examinations for reoccurring indications may be performed as noted.  ABI Findings: +---------+------------------+-----+---------+--------+ Right    Rt Pressure (mmHg)IndexWaveform Comment  +---------+------------------+-----+---------+--------+ Brachial 150                    triphasic         +---------+------------------+-----+---------+--------+ PTA      108               0.72 biphasic          +---------+------------------+-----+---------+--------+ DP       137               0.91 triphasic         +---------+------------------+-----+---------+--------+ Great Toe76                0.51                   +---------+------------------+-----+---------+--------+ +---------+------------------+-----+----------+-------+ Left     Lt Pressure (mmHg)IndexWaveform  Comment +---------+------------------+-----+----------+-------+ Brachial 136                    triphasic         +---------+------------------+-----+----------+-------+ PTA      66                0.44 monophasic        +---------+------------------+-----+----------+-------+ DP       53                0.35 monophasic        +---------+------------------+-----+----------+-------+ Great Toe0                 0.00                   +---------+------------------+-----+----------+-------+ +-------+-----------+-----------+------------+------------+ ABI/TBIToday's ABIToday's TBIPrevious ABIPrevious TBI +-------+-----------+-----------+------------+------------+ Right  0.91       0.51                                +-------+-----------+-----------+------------+------------+ Left   0.44       0                                    +-------+-----------+-----------+------------+------------+  Summary: Right: Resting right ankle-brachial index indicates mild right lower extremity arterial disease. The right toe-brachial index is abnormal. Left: Resting left ankle-brachial index indicates severe left lower extremity arterial disease. The left toe-brachial index is abnormal.  *See table(s) above for measurements and observations.  Electronically signed by [name] MD on 01/05/2020 at 7:35:40 PM.    Final  VAS [place] LOWER EXTREMITY ARTERIAL DUPLEX  Result Date: 01/05/2020 LOWER EXTREMITY ARTERIAL DUPLEX STUDY Indications: Blue, cold toes. High Risk Factors: Hypertension, Diabetes.  Current ABI: R 0.91 L 0.44 Limitations: Patient body habitus, poor ultrasound/tissue interface. Comparison Study: No prior studies. Performing Technologist: Oliver Hum RVT  Examination Guidelines: A complete evaluation includes B-mode imaging, spectral Doppler, color Doppler, and power Doppler as needed of all accessible portions of each vessel. Bilateral testing is considered an integral part of a complete examination. Limited examinations for reoccurring indications may be performed as noted.  +-----------+--------+-----+--------+----------+------------------+ LEFT       PSV cm/sRatioStenosisWaveform  Comments           +-----------+--------+-----+--------+----------+------------------+ EIA Distal 116                  biphasic                     +-----------+--------+-----+--------+----------+------------------+ CFA Mid    132                  biphasic                     +-----------+--------+-----+--------+----------+------------------+ DFA        111                  biphasic                     +-----------+--------+-----+--------+----------+------------------+ SFA Prox   104                  biphasic                     +-----------+--------+-----+--------+----------+------------------+ SFA Mid    85                    biphasic                     +-----------+--------+-----+--------+----------+------------------+ SFA Distal 91                   biphasic                     +-----------+--------+-----+--------+----------+------------------+ POP Prox   26                   monophasic                   +-----------+--------+-----+--------+----------+------------------+ POP Distal 0                              Unable to insonate +-----------+--------+-----+--------+----------+------------------+ ATA Prox   73                   biphasic                     +-----------+--------+-----+--------+----------+------------------+ ATA Mid    33                   monophasic                   +-----------+--------+-----+--------+----------+------------------+ ATA Distal 32                   monophasic                   +-----------+--------+-----+--------+----------+------------------+ PTA Prox  Unable to insonate +-----------+--------+-----+--------+----------+------------------+ PTA Mid    27                   monophasic                   +-----------+--------+-----+--------+----------+------------------+ PTA Distal 9                    monophasic                   +-----------+--------+-----+--------+----------+------------------+ PERO Prox                                 Unable to insonate +-----------+--------+-----+--------+----------+------------------+ PERO Mid                                  Unable to insonate +-----------+--------+-----+--------+----------+------------------+ PERO Distal15                   monophasic                   +-----------+--------+-----+--------+----------+------------------+ DP         29                   monophasic                   +-----------+--------+-----+--------+----------+------------------+  Summary: Left: The external iliac, common femoral, and superficial femoral  arteries appear patent with no significant stenosis. Monophasic waveforms are noted in the proximal popliteal artery with poor visualization of the distal segment. Which suggests a possible occlusion. Anterior tibial artery appears patent throughout with monophasic flow. The posterior tibial, and peroneal arteries demonstrated monophasic flow with likely areas of occlusive disease. The proximal calf arteries were difficult to visualize.  See table(s) above for measurements and observations. Electronically signed by [name] MD on 01/05/2020 at 66:34:41 PM.    Final    VAS [place] LOWER EXTREMITY VENOUS (DVT)  Result Date: 01/13/2020  Lower Venous DVTStudy Indications: Swelling.  Limitations: Body habitus and poor ultrasound/tissue interface. Performing Technologist: Antonieta Pert RDMS, RVT  Examination Guidelines: A complete evaluation includes B-mode imaging, spectral Doppler, color Doppler, and power Doppler as needed of all accessible portions of each vessel. Bilateral testing is considered an integral part of a complete examination. Limited examinations for reoccurring indications may be performed as noted. The reflux portion of the exam is performed with the patient in reverse Trendelenburg.  +---------+---------------+---------+-----------+----------+-------------------+ RIGHT    CompressibilityPhasicitySpontaneityPropertiesThrombus Aging      +---------+---------------+---------+-----------+----------+-------------------+ CFV      Partial        Yes      Yes                  patient body                                                              habitus and  position limiting                                                         evaluation          +---------+---------------+---------+-----------+----------+-------------------+ SFJ      Full                                                              +---------+---------------+---------+-----------+----------+-------------------+ FV Prox                          Yes                                      +---------+---------------+---------+-----------+----------+-------------------+ FV Mid                           Yes                                      +---------+---------------+---------+-----------+----------+-------------------+ FV Distal                        Yes                                      +---------+---------------+---------+-----------+----------+-------------------+ PFV      Full                                                             +---------+---------------+---------+-----------+----------+-------------------+ POP      Full                                                             +---------+---------------+---------+-----------+----------+-------------------+ PTV                                                   not clearly                                                               visualized          +---------+---------------+---------+-----------+----------+-------------------+ PERO  not clearly                                                               visualized          +---------+---------------+---------+-----------+----------+-------------------+   +---------+---------------+---------+-----------+----------+------------------+ LEFT     CompressibilityPhasicitySpontaneityPropertiesThrombus Aging     +---------+---------------+---------+-----------+----------+------------------+ CFV      Full           Yes      Yes                                     +---------+---------------+---------+-----------+----------+------------------+ SFJ      Full                                                            +---------+---------------+---------+-----------+----------+------------------+ FV Prox   Full                                                            +---------+---------------+---------+-----------+----------+------------------+ FV Mid   Full                                                            +---------+---------------+---------+-----------+----------+------------------+ FV Distal                        Yes                                     +---------+---------------+---------+-----------+----------+------------------+ PFV      Full                                                            +---------+---------------+---------+-----------+----------+------------------+ POP      Full           Yes      Yes                                     +---------+---------------+---------+-----------+----------+------------------+ PTV                                                   not clearly  visualized         +---------+---------------+---------+-----------+----------+------------------+ PERO                                                  not clearly                                                              visualized         +---------+---------------+---------+-----------+----------+------------------+    Summary: RIGHT: - There is no evidence of deep vein thrombosis in the lower extremity. However, portions of this examination were limited- see technologist comments above.  LEFT: - There is no evidence of deep vein thrombosis in the lower extremity. However, portions of this examination were limited- see technologist comments above.  - Prominent lymph nodes noted bilateral groin areas.  *See table(s) above for measurements and observations. Electronically signed by Deitra Mayo MD on 01/13/2020 at 6:50:27 PM.    Final

## 2020-01-15 ENCOUNTER — Inpatient Hospital Stay (HOSPITAL_COMMUNITY): Payer: BC Managed Care – PPO

## 2020-01-15 DIAGNOSIS — J9601 Acute respiratory failure with hypoxia: Secondary | ICD-10-CM

## 2020-01-15 LAB — CBC WITH DIFFERENTIAL/PLATELET
Abs Immature Granulocytes: 0.07 10*3/uL (ref 0.00–0.07)
Basophils Absolute: 0 10*3/uL (ref 0.0–0.1)
Basophils Relative: 0 %
Eosinophils Absolute: 0 10*3/uL (ref 0.0–0.5)
Eosinophils Relative: 0 %
HCT: 31.2 % — ABNORMAL LOW (ref 36.0–46.0)
Hemoglobin: 10.1 g/dL — ABNORMAL LOW (ref 12.0–15.0)
Immature Granulocytes: 1 %
Lymphocytes Relative: 11 %
Lymphs Abs: 1 10*3/uL (ref 0.7–4.0)
MCH: 29.8 pg (ref 26.0–34.0)
MCHC: 32.4 g/dL (ref 30.0–36.0)
MCV: 92 fL (ref 80.0–100.0)
Monocytes Absolute: 0.4 10*3/uL (ref 0.1–1.0)
Monocytes Relative: 5 %
Neutro Abs: 7.6 10*3/uL (ref 1.7–7.7)
Neutrophils Relative %: 83 %
Platelets: 208 10*3/uL (ref 150–400)
RBC: 3.39 MIL/uL — ABNORMAL LOW (ref 3.87–5.11)
RDW: 16.9 % — ABNORMAL HIGH (ref 11.5–15.5)
WBC: 9.1 10*3/uL (ref 4.0–10.5)
nRBC: 0 % (ref 0.0–0.2)

## 2020-01-15 LAB — PHOSPHORUS: Phosphorus: 5.3 mg/dL — ABNORMAL HIGH (ref 2.5–4.6)

## 2020-01-15 LAB — COMPREHENSIVE METABOLIC PANEL
ALT: 31 U/L (ref 0–44)
AST: 57 U/L — ABNORMAL HIGH (ref 15–41)
Albumin: 2.2 g/dL — ABNORMAL LOW (ref 3.5–5.0)
Alkaline Phosphatase: 47 U/L (ref 38–126)
Anion gap: 12 (ref 5–15)
BUN: 63 mg/dL — ABNORMAL HIGH (ref 8–23)
CO2: 19 mmol/L — ABNORMAL LOW (ref 22–32)
Calcium: 8.2 mg/dL — ABNORMAL LOW (ref 8.9–10.3)
Chloride: 109 mmol/L (ref 98–111)
Creatinine, Ser: 2.48 mg/dL — ABNORMAL HIGH (ref 0.44–1.00)
GFR calc Af Amer: 23 mL/min — ABNORMAL LOW (ref >60)
GFR calc non Af Amer: 20 mL/min — ABNORMAL LOW (ref >60)
Glucose, Bld: 253 mg/dL — ABNORMAL HIGH (ref 70–99)
Potassium: 4.7 mmol/L (ref 3.5–5.1)
Sodium: 140 mmol/L (ref 135–145)
Total Bilirubin: 0.6 mg/dL (ref 0.3–1.2)
Total Protein: 5.6 g/dL — ABNORMAL LOW (ref 6.5–8.1)

## 2020-01-15 LAB — GLUCOSE, CAPILLARY
Glucose-Capillary: 230 mg/dL — ABNORMAL HIGH (ref 70–99)
Glucose-Capillary: 199 mg/dL — ABNORMAL HIGH (ref 70–99)
Glucose-Capillary: 214 mg/dL — ABNORMAL HIGH (ref 70–99)
Glucose-Capillary: 198 mg/dL — ABNORMAL HIGH (ref 70–99)

## 2020-01-15 LAB — MAGNESIUM: Magnesium: 2 mg/dL (ref 1.7–2.4)

## 2020-01-15 LAB — ECHOCARDIOGRAM LIMITED
Height: 61 in
Weight: 3989.44 oz

## 2020-01-15 LAB — FERRITIN: Ferritin: 1624 ng/mL — ABNORMAL HIGH (ref 11–307)

## 2020-01-15 LAB — C-REACTIVE PROTEIN: CRP: 14.5 mg/dL — ABNORMAL HIGH (ref <1.0)

## 2020-01-15 LAB — D-DIMER, QUANTITATIVE: D-Dimer, Quant: 2.3 ug/mL-FEU — ABNORMAL HIGH (ref 0.00–0.50)

## 2020-01-15 MED ORDER — INSULIN GLARGINE 100 UNIT/ML ~~LOC~~ SOLN
26.0000 [IU] | Freq: Two times a day (BID) | SUBCUTANEOUS | Status: DC
  Administered 2020-01-15 – 2020-01-22 (×15): 26 [IU] via SUBCUTANEOUS
  Filled 2020-01-15 (×18): qty 0.26

## 2020-01-15 MED ORDER — COLCHICINE 0.3 MG HALF TABLET
0.3000 mg | ORAL_TABLET | Freq: Every day | ORAL | Status: DC
  Administered 2020-01-16: 0.3 mg via ORAL
  Filled 2020-01-15: qty 1

## 2020-01-15 NOTE — Progress Notes (Signed)
Physical Therapy Evaluation  Patient presents with decreased overall strength and mobility and impaired activity tolerance. She had a recent hospitalization and was discharged home but was not thriving at home. Patient reports she stayed in bed most of the day. Recommend continued skilled PT services and discharge to SNF for short term rehabilitation.    01/15/20 0848  PT Visit Information  Last PT Received On 01/15/20  Assistance Needed +2 (1-2 assist)  PT/OT/SLP Co-Evaluation/Treatment Yes  Reason for Co-Treatment Complexity of the patient's impairments (multi-system involvement);Necessary to address cognition/behavior during functional activity;For patient/therapist safety  PT goals addressed during session Mobility/safety with mobility;Balance  History of Present Illness 64 year old female admitted 12/23/2019 with SOB and cough. Patient with acute hypoxic respiratory failure due to COVID PNA and decompensatd diastolic heart failure. Of note, patient with recent hospitalization 4/18-4/23/21 for stent to L distal SFA. This admission, steroids and Remdesivir started 01/13/20. Patient with elevated troponin likely secondary to underlying CKD, not consistent with ACS. PMH: hypertension, HLD, DM-2, CKD stage IV, PVD s/p stent to left distal SFA on [id number], chronic diastolic heart failure  Precautions  Precautions Fall;Other (comment)  Precaution Comments 224mL fluid restriction, elevate LEs  Restrictions  Weight Bearing Restrictions No  Home Living  Family/patient expects to be discharged to: Private residence  [place];Other (Comment) (dtr was staying at night after work)  Available Help at Discharge Family;Available PRN/intermittently  Type of Home Apartment  Home Access Level entry  Home Layout One level  Bathroom Shower/Tub Tub/shower unit  English as a second language teacher;Wheelchair - [employer] - 2 wheels;Cane - single point;Walker - 4 wheels   Additional Comments Patient reports w/c provided upon discharge after last hospitalization but that it is too small for her. She reports difficulty at home and staying in bed most of the time.  Prior Function  Level of Independence Independent with assistive device(s)  Gait / Transfers Assistance Needed modI with whatever device she could get around with (w/c, walker, assist from people) after last hospitalization  ADL's / [place] for a few hours a day (18 hours/week), 7 days a week. Patient reports staying in bed, incontinent of urine, after discharge from last hospitalization.  Comments Patient unable to tolerate putting much weight through LEs after last hospitalization.  Communication  Communication No difficulties  Pain Assessment  Pain Assessment No/denies pain (No complaints of pain, no signs/symptoms of pain)  Cognition  Arousal/Alertness Awake/alert  Behavior During Therapy Flat affect  Overall Cognitive Status No family/caregiver present to determine baseline cognitive functioning  General Comments Requires increased time between tasks  Lower Extremity Assessment  Lower Extremity Assessment Generalized weakness;RLE deficits/detail;LLE deficits/detail  RLE Deficits / Details knee and ankle strength >/=/5, hip flexion strength 3-/5  LLE Deficits / Details knee and ankle strength >/=/5, hip flexion strength 3-/5  Bed Mobility  Overal bed mobility Needs Assistance  Bed Mobility Rolling;Sidelying to Sit  Rolling Mod assist;+2 for safety/equipment  Sidelying to sit +2 for physical assistance;+2 for safety/equipment;Mod assist;Max assist  General bed mobility comments Heavy use of bedrail, cues for technique required  Transfers  Overall transfer level Needs assistance  Equipment used Rolling walker (2 wheeled)  Transfers Stand Pivot Transfers  Stand pivot transfers Min assist;+2 physical assistance;+2 safety/equipment  General transfer comment stand-step  transfer EOB>chair   Ambulation/Gait  General Gait Details deferred due to patient complaint of dizziness, decrease in BP after transfer to chair, noramalized with seated recovery in reclined position  Balance  Overall balance assessment Needs assistance  Sitting-balance support Feet supported;No upper extremity supported  Sitting balance-Leahy Scale Fair  Standing balance support Bilateral upper extremity supported  Standing balance comment contact guard with RW  General Comments  General comments (skin integrity, edema, etc.) Patient on 3L [place]. O2 saturation 93% at rest down to 87% sitting EOB with HR in 50s-60s bpm. Patient reported dizziness sitting EOB, BP 128/62. Oxygen saturation down to 79-80% after transfer to chair with 2 minute recovery to reach high 80s%. BP after transfer to chair 101/45 with improvement to 129/53 after sitting in chair in reclined position x 2-3 minutes.  PT - End of Session  Equipment Utilized During Treatment Gait belt  Activity Tolerance Patient limited by fatigue  Patient left in chair;with call bell/phone within reach;with chair alarm set  Nurse Communication Mobility status;Other (comment) (BP response)  PT Assessment  PT Recommendation/Assessment Patient needs continued PT services  PT Visit Diagnosis Other abnormalities of gait and mobility (R26.89);Muscle weakness (generalized) (M62.81);Pain  Barriers to Discharge Decreased caregiver support  Barriers to Discharge Comments Patient with recent admission to the hospital with discharge home and not thriving at home.  PT Plan  PT Frequency (ACUTE ONLY) Min 2X/week  PT Treatment/Interventions (ACUTE ONLY) DME instruction;Gait training;Functional mobility training;Therapeutic activities;Therapeutic exercise;Balance training;Patient/family education  AM-PAC PT "6 Clicks" Mobility Outcome Measure (Version 2)  Help needed turning from your back to your side while in a flat bed without using bedrails? 3  Help  needed moving from lying on your back to sitting on the side of a flat bed without using bedrails? 2  Help needed moving to and from a bed to a chair (including a wheelchair)? 2  Help needed standing up from a chair using your arms (e.g., wheelchair or bedside chair)? 3  Help needed to walk in hospital room? 3  Help needed climbing 3-5 steps with a railing?  2  6 Click Score 15  Consider Recommendation of Discharge To: CIR/SNF/LTACH  PT Recommendation  Follow Up Recommendations SNF;Supervision/Assistance - 24 hour  PT equipment Other (comment) (TBD at next level of care)  Individuals Consulted  Consulted and Agree with Results and Recommendations Patient  Acute Rehab PT Goals  Time For Goal Achievement 01/28/20  Potential to Achieve Goals Good  PT Time Calculation  PT Start Time (ACUTE ONLY) 0848  PT Stop Time (ACUTE ONLY) 0923  PT Time Calculation (min) (ACUTE ONLY) 35 min  PT General Charges  $$ ACUTE PT VISIT 1 Visit  PT Evaluation  $PT Eval Moderate Complexity 1 Mod  Written Expression  Dominant Hand Right   [name], PT, DPT Acute Rehab [phone] office

## 2020-01-15 NOTE — Progress Notes (Signed)
Patient slept  Well, Oxygen saturating 94 94% vital signs are stable.  Prn Percocet 5/325mg  given for pain. No acute changes noted

## 2020-01-15 NOTE — Progress Notes (Addendum)
PROGRESS NOTE                                                                                                                                                                                                             Patient Demographics:    [name], is a 64 y.o. female, DOB - [birth year], [id number]  Outpatient Primary MD for the patient is Pavelock, Ralene Bathe, MD   Admit date - 01/16/2020   LOS - 2  Chief Complaint  Patient presents with   Shortness of Breath   Chest Pain       Brief Narrative: Patient is a 64 y.o. female  PMHx of hypertension, HLD, DM-2, CKD stage IV, PVD s/p stent to left distal SFA on [id number], chronic diastolic heart failure-presented with shortness of breath or cough-found to have acute hypoxic respiratory failure secondary to COVID-19 pneumonia and decompensated diastolic heart failure.  Significant Events: 4/18-4/23>> admit to [facility]-s/p stent to left distal SFA 4/27>> admit to [facility] for hypoxia from Covid/heart failure  COVID-19 medications: Steroids: 4/27>> Remdesivir: 4/27>>  Antibiotics: Vancomycin: 4/27 x 1 Cefepime: 4/27 x 1  Microbiology data: 4/27: Blood culture>> no growth 4/28: Blood culture>> no growth  DVT prophylaxis: SQ Lovenox  Procedures: None  Consults: None    Subjective:    [name] today is requiring around 2-3 L of oxygen.  She appears stable at rest-but claims that with minimal movement she gets short of breath.  She continues to have coughing spells.   Assessment  & Plan :   Acute Hypoxic Resp Failure due to Covid 19 Viral pneumonia and decompensated diastolic heart failure: Remains comfortable/stable at last-required around 2-3 L of oxygen.  But does get short of breath/coughing spells with minimal movement.  CRP is trending down-remains on steroids/remdesivir-diuretics on hold due to jump in creatinine.  See below.  Continue to monitor  closely and attempt to slowly titrate down FiO2.   COVID-19 pneumonia: Remains on around 2-3 L of oxygen-CRP trending down.  Continue to monitor closely-remains on steroids/remdesivir.  Has consented to the use of Actemra if hypoxemia worsens  This MD discussed rationale, risks, benefits of off label use of Actemra-she consents.  She has no history of tuberculosis, hepatitis B, chronic diverticulitis.    Fever: afebrile  O2 requirements:  SpO2: 94 % O2 Flow Rate (L/min):  3 L/min   COVID-19 Labs: Recent Labs    01/13/20 2200 01/14/20 0307 01/15/20 0528  DDIMER 3.70* 3.34* 2.30*  FERRITIN 1,334* 1,474* 1,624*  LDH 627*  --   --   CRP 21.7* 21.2* 14.5*       Component Value Date/Time   BNP 193.8 (H) 01/13/2020 0002    Recent Labs  Lab 01/13/20 2200  PROCALCITON 0.21    Lab Results  Component Value Date   SARSCOV2NAA POSITIVE (A) 01/13/2020   [place] NEGATIVE 01/05/2020   [place] Not Detected 11/24/2019     Prone/Incentive Spirometry: encouraged  incentive spirometry use 3-4/hour.  Elevated troponin: Trend is flat-not consistent with ACS-likely secondary to underlying CKD-with some component from demand ischemia due to COVID-19/heart failure.  Decompensated diastolic heart failure: Volume status is improved-marked decrease in her lower extremity edema today-her creatinine has jumped up today-hold Lasix-follow weights/electrolytes and urine output.  Await repeat echocardiogram.  PAD-s/p stent to left SFA on 4/22: Both lower extremities appear warm-continue aspirin, Plavix and statin.  HTN: BP stable-continue Cardura-Lasix and lisinopril on hold.    AKI on CKD stage IV: Creatinine bumped today-hold lasix-follow.  DM-2 with hyperglycemia (A1c 12.2 on 4/19): CBGs relatively stable but on higher dosing of steroids-suspect will have episodes of hypoglycemia-we will go and increase Lantus to 26 units twice daily-continue SSI and monitor closely.  Recent Labs     01/14/20 1712 01/14/20 2153 01/15/20 0752  GLUCAP 177* 264* 230*    Thrombocytopenia: Appears to be mild-in a chronic issue-stable to be followed up closely.  Anemia of chronic disease: New baseline-likely related to CKD.  Gout: No flare-continue colchicine  OSA: CPAP nightly  Obesity: Estimated body mass index is 47.11 kg/m as calculated from the following:   Height as of this encounter: 5\' 1"  (1.549 m).   Weight as of this encounter: 113.1 kg.   ABG:    Component Value Date/Time   PHART 7.460 (H) 09/30/2008 1120   PCO2ART 41.0 09/30/2008 1120   PO2ART 83.0 09/30/2008 1120   HCO3 26.4 12/06/2016 0922   HCO3 26.1 12/06/2016 0922   TCO2 28 12/06/2016 0922   TCO2 28 12/06/2016 0922   O2SAT 69.0 12/06/2016 0922   O2SAT 67.0 12/06/2016 0922    Vent Settings: N/A  Condition - Extremely Guarded  Family Communication  : daughter over the phone 4/29  Code Status :  Full Code  Diet :  Diet Order            Diet heart healthy/carb modified Room service appropriate? Yes; Fluid consistency: Thin; Fluid restriction: 2000 mL Fluid  Diet effective now               Disposition Plan  :   Status is: Inpatient  Remains inpatient appropriate because:Inpatient level of care appropriate due to severity of illness  Dispo: The patient is from: Home              Anticipated d/c is to: Home              Anticipated d/c date is: > 3 days              Patient currently is not medically stable to d/c.  Barriers to discharge: Hypoxia requiring O2 supplementation/complete 5 days of IV Remdesivir  Antimicorbials  :    Anti-infectives (From admission, onward)   Start     Dose/Rate Route Frequency Ordered Stop   01/15/20 1200  vancomycin (VANCOREADY) IVPB 1250 mg/250 mL  Status:  Discontinued     1,250 mg 166.7 mL/hr over 90 Minutes Intravenous Every 48 hours 01/13/20 1018 01/14/20 1038   01/14/20 1000  remdesivir 100 mg in sodium chloride 0.9 % 100 mL IVPB     100 mg 200  mL/hr over 30 Minutes Intravenous Daily 01/13/20 1129 01/18/20 0959   01/13/20 1630  valACYclovir (VALTREX) tablet 1,000 mg     1,000 mg Oral 3 times daily 01/13/20 1557     01/13/20 1230  remdesivir 200 mg in sodium chloride 0.9% 250 mL IVPB     200 mg 580 mL/hr over 30 Minutes Intravenous Once 01/13/20 1129 01/13/20 1800   01/13/20 1015  ceFEPIme (MAXIPIME) 2 g in sodium chloride 0.9 % 100 mL IVPB     2 g 200 mL/hr over 30 Minutes Intravenous  Once 01/13/20 1003 01/13/20 1434   01/13/20 1015  vancomycin (VANCOREADY) IVPB 2000 mg/400 mL     2,000 mg 200 mL/hr over 120 Minutes Intravenous  Once 01/13/20 1012 01/13/20 2258      Inpatient Medications  Scheduled Meds:  vitamin C  500 mg Oral Daily   aspirin EC  81 mg Oral Q breakfast   atorvastatin  40 mg Oral Daily   brimonidine  1 drop Both Eyes BID   clopidogrel  75 mg Oral Daily   colchicine  0.6 mg Oral Daily   dorzolamide-timolol  1 drop Both Eyes BID   doxazosin  4 mg Oral QHS   enoxaparin (LOVENOX) injection  60 mg Subcutaneous Q24H   famotidine  20 mg Oral Daily   insulin aspart  0-20 Units Subcutaneous TID WC   insulin aspart  0-5 Units Subcutaneous QHS   insulin glargine  22 Units Subcutaneous BID   Ipratropium-Albuterol  1 puff Inhalation Q6H   latanoprost  1 drop Both Eyes QHS   linagliptin  5 mg Oral Daily   methylPREDNISolone (SOLU-MEDROL) injection  40 mg Intravenous Q12H   prednisoLONE acetate  1 drop Right Eye BID   sodium chloride flush  3 mL Intravenous Once   valACYclovir  1,000 mg Oral TID   zinc sulfate  220 mg Oral Daily   Continuous Infusions:  remdesivir 100 mg in NS 100 mL 100 mg (01/15/20 1054)   PRN Meds:.acetaminophen, chlorpheniramine-HYDROcodone, guaiFENesin-dextromethorphan, ondansetron **OR** ondansetron (ZOFRAN) IV, oxyCODONE-acetaminophen   Time Spent in minutes  35  See all Orders from today for further details   Oren Binet M.D on 01/15/2020 at 11:37  AM  To page go to [email or web address] - use universal password  [facility] -  Office  [phone]    Objective:   Vitals:   01/15/20 0400 01/15/20 0504 01/15/20 0505 01/15/20 0506  BP: 131/70     Pulse: (!) 58 (!) 53 (!) 54 (!) 55  Resp: (!) 26 (!) 26 (!) 23 (!) 24  Temp: 97.7 F (36.5 C)     TempSrc: Oral     SpO2: 96% 94% 94% 94%  Weight: 113.1 kg     Height:        Wt Readings from Last 3 Encounters:  01/15/20 113.1 kg  01/08/20 115.6 kg  07/15/19 102.9 kg     Intake/Output Summary (Last 24 hours) at 01/15/2020 1137 Last data filed at 01/14/2020 2038 Gross per 24 hour  Intake 338 ml  Output 800 ml  Net -462 ml    Physical Exam Gen Exam:Alert awake-not in any distress HEENT:atraumatic, normocephalic Chest: Few bibasilar rales CVS:S1S2 regular Abdomen:soft non tender,  non distended Extremities:+ edema Neurology: Non focal Skin: no rash   Data Review:    CBC Recent Labs  Lab 01/09/20 0308 01/13/20 0002 01/14/20 0307 01/15/20 0528  WBC 4.2 5.3 5.1 9.1  HGB 9.7* 9.8* 10.0* 10.1*  HCT 30.8* 30.7* 31.4* 31.2*  PLT 83* 110* 143* 208  MCV 96.0 94.8 94.6 92.0  MCH 30.2 30.2 30.1 29.8  MCHC 31.5 31.9 31.8 32.4  RDW 17.3* 17.1* 17.1* 16.9*  LYMPHSABS  --   --  0.5* 1.0  MONOABS  --   --  0.2 0.4  EOSABS  --   --  0.0 0.0  BASOSABS  --   --  0.0 0.0    Chemistries  Recent Labs  Lab 01/09/20 0308 01/13/20 0002 01/14/20 0307 01/15/20 0528  NA 135 135 140 140  K 4.8 4.2 4.5 4.7  CL 110 106 109 109  CO2 16* 19* 19* 19*  GLUCOSE 267* 238* 196* 253*  BUN 37* 40* 44* 63*  CREATININE 1.87* 2.04* 2.08* 2.48*  CALCIUM 8.2* 7.9* 8.1* 8.2*  MG  --   --  1.6* 2.0  AST  --   --  62* 57*  ALT  --   --  34 31  ALKPHOS  --   --  38 47  BILITOT  --   --  0.8 0.6   ------------------------------------------------------------------------------------------------------------------ No results for input(s): CHOL, HDL, LDLCALC, TRIG, CHOLHDL, LDLDIRECT in  the last 72 hours.  Lab Results  Component Value Date   HGBA1C 12.2 (H) 01/05/2020   ------------------------------------------------------------------------------------------------------------------ No results for input(s): TSH, T4TOTAL, T3FREE, THYROIDAB in the last 72 hours.  Invalid input(s): FREET3 ------------------------------------------------------------------------------------------------------------------ Recent Labs    01/14/20 0307 01/15/20 0528  FERRITIN 1,474* 1,624*    Coagulation profile No results for input(s): INR, PROTIME in the last 168 hours.  Recent Labs    01/14/20 0307 01/15/20 0528  DDIMER 3.34* 2.30*    Cardiac Enzymes No results for input(s): CKMB, TROPONINI, MYOGLOBIN in the last 168 hours.  Invalid input(s): CK ------------------------------------------------------------------------------------------------------------------    Component Value Date/Time   BNP 193.8 (H) 01/13/2020 0002    Micro Results Recent Results (from the past 240 hour(s))  Culture, Urine     Status: Abnormal   Collection Time: 01/06/20  7:48 AM   Specimen: Urine, Random  Result Value Ref Range Status   Specimen Description URINE, RANDOM  Final   Special Requests   Final    NONE Performed at [facility], [address], [place], [place] [address]    Culture >=100,000 COLONIES/mL ESCHERICHIA COLI (A)  Final   Report Status 01/08/2020 FINAL  Final   Organism ID, Bacteria ESCHERICHIA COLI (A)  Final      Susceptibility   Escherichia coli - MIC*    AMPICILLIN >=32 RESISTANT Resistant     CEFAZOLIN <=4 SENSITIVE Sensitive     CEFTRIAXONE <=0.25 SENSITIVE Sensitive     CIPROFLOXACIN <=0.25 SENSITIVE Sensitive     GENTAMICIN <=1 SENSITIVE Sensitive     IMIPENEM <=0.25 SENSITIVE Sensitive     NITROFURANTOIN <=16 SENSITIVE Sensitive     TRIMETH/SULFA <=20 SENSITIVE Sensitive     AMPICILLIN/SULBACTAM >=32 RESISTANT Resistant     PIP/TAZO 8 SENSITIVE  Sensitive     * >=100,000 COLONIES/mL ESCHERICHIA COLI  Respiratory Panel by RT PCR (Flu A&B, Covid) - Nasopharyngeal Swab     Status: Abnormal   Collection Time: 01/13/20 10:25 AM   Specimen: Nasopharyngeal Swab  Result Value Ref  Range Status   SARS Coronavirus 2 by RT PCR POSITIVE (A) NEGATIVE Final    Comment: RESULT CALLED TO, READ BACK BY AND VERIFIED WITH: Sheran Lawless RN 12:25 01/13/20 ([email or web address]) (NOTE) SARS-CoV-2 target nucleic acids are DETECTED. SARS-CoV-2 RNA is generally detectable in upper respiratory specimens  during the acute phase of infection. Positive results are indicative of the presence of the identified virus, but do not rule out bacterial infection or co-infection with other pathogens not detected by the test. Clinical correlation with patient history and other diagnostic information is necessary to determine patient infection status. The expected result is Negative. Fact Sheet for Patients:  PinkCheek.be Fact Sheet for Healthcare Providers: GravelBags.it This test is not yet approved or cleared by the [place] FDA and  has been authorized for detection and/or diagnosis of SARS-CoV-2 by FDA under an Emergency Use Authorization (EUA).  This EUA will remain in effect (meaning this test can be used)  for the duration of  the COVID-19 declaration under Section 564(b)(1) of the Act, 21 U.S.C. section 360bbb-3(b)(1), unless the authorization is terminated or revoked sooner.    Influenza A by PCR NEGATIVE NEGATIVE Final   Influenza B by PCR NEGATIVE NEGATIVE Final    Comment: (NOTE) The Xpert Xpress SARS-CoV-2/FLU/RSV assay is intended as an aid in  the diagnosis of influenza from Nasopharyngeal swab specimens and  should not be used as a sole basis for treatment. Nasal washings and  aspirates are unacceptable for Xpert Xpress SARS-CoV-2/FLU/RSV  testing. Fact Sheet for  Patients: PinkCheek.be Fact Sheet for Healthcare Providers: GravelBags.it This test is not yet approved or cleared by the [place] FDA and  has been authorized for detection and/or diagnosis of SARS-CoV-2 by  FDA under an Emergency Use Authorization (EUA). This EUA will remain  in effect (meaning this test can be used) for the duration of the  Covid-19 declaration under Section 564(b)(1) of the Act, 21  U.S.C. section 360bbb-3(b)(1), unless the authorization is  terminated or revoked. Performed at [facility], [place] [address], [place], [place] [address]   Blood culture (routine x 2)     Status: None (Preliminary result)   Collection Time: 01/13/20 11:10 AM   Specimen: BLOOD  Result Value Ref Range Status   Specimen Description BLOOD LEFT ANTECUBITAL  Final   Special Requests   Final    BOTTLES DRAWN AEROBIC AND ANAEROBIC Blood Culture results may not be optimal due to an inadequate volume of blood received in culture bottles   Culture   Final    NO GROWTH 2 DAYS Performed at [facility], [place] [address] [place], [place] [address]    Report Status PENDING  Incomplete  Blood culture (routine x 2)     Status: None (Preliminary result)   Collection Time: 01/14/20  8:51 AM   Specimen: BLOOD LEFT HAND  Result Value Ref Range Status   Specimen Description BLOOD LEFT HAND  Final   Special Requests   Final    BOTTLES DRAWN AEROBIC AND ANAEROBIC Blood Culture results may not be optimal due to an inadequate volume of blood received in culture bottles   Culture   Final    NO GROWTH < 24 HOURS Performed at [facility], [place] [address], [place], [place] [address]    Report Status PENDING  Incomplete    Radiology Reports DG Chest 2 View  Result Date: 01/13/2020 CLINICAL DATA:  Shortness of breath and bilateral leg swelling. EXAM: CHEST -  2 VIEW COMPARISON:  November 30, 2016 FINDINGS: Moderate severity diffuse  multifocal infiltrates are seen, bilaterally. There is no evidence of a pleural effusion or pneumothorax. The cardiac silhouette is mildly enlarged. A radiopaque fusion plate and screws are seen overlying the lower cervical spine. The visualized skeletal structures are otherwise unremarkable. Radiopaque surgical clips are seen within the right upper quadrant. IMPRESSION: Moderate severity bilateral multifocal infiltrates. Electronically Signed   By: Virgina Norfolk M.D.   On: 01/13/2020 00:27   US RENAL  Result Date: 01/06/2020 CLINICAL DATA:  Chronic kidney disease EXAM: RENAL / URINARY TRACT ULTRASOUND COMPLETE COMPARISON:  None. FINDINGS: Right Kidney: Renal measurements: 9.3 x 4.3 x 4.6 cm = volume: 96 mL. Lobulated contour. No hydronephrosis. Normal echogenicity. Left Kidney: Renal measurements: 10.7 x 5.0 x 5.2 cm = volume: 145 mL. Lobulated contour without hydronephrosis. Normal echogenicity. Bladder: Decompressed. Other: Visualization is generally limited by body habitus. IMPRESSION: No hydronephrosis. Lobulated renal contours without focal parenchymal abnormality. Electronically Signed   By: Ulyses Jarred M.D.   On: 01/06/2020 16:16   PERIPHERAL VASCULAR CATHETERIZATION  Result Date: 01/08/2020 Patient name: [name]   MRN: [id number]        DOB: 02/15/1956            Sex: female  01/08/2020 Pre-operative Diagnosis: Critical limb ischemia of the left lower extremity with tissue loss Post-operative diagnosis:  Same Surgeon:  Marty Heck, MD Procedure Performed: 1.  Ultrasound guided access of the right common femoral artery 2.  Aortogram with CO2 including catheter selection of the aorta 3.  Left lower extremity arteriogram with selection of third order branches 4.  Left distal SFA above-knee popliteal artery angioplasty with stent placement (primarily stented with a 6 mm x 60 mm drug-coated Eluvia postdilated with a 5 mm Mustang) 5.  Left anterior tibial angioplasty for focal  perforation with resolution of the perforation (3 mm Sterling) 6.  Mynx closure of the right common femoral artery 7.  55 minutes of monitored moderate conscious sedation time   Indications: Patient is a 64 year old female who was seen in consultation by Dr. Trula Slade for left lower extremity rest pain with severely depressed ABIs and duplex showing likely popliteal disease.  She presents today for left lower extremity arteriogram after risk benefits discussed.  Findings:  Aortogram was performed with CO2 to limit contrast and there was no flow-limiting aortoiliac stenosis.  Left lower extremity arteriogram showed a patent common femoral profunda and widely patent SFA in the proximal to mid segment.  At [facility] canal in the distal SFA and above-knee popliteal artery showed a very short focal occlusion with large collateral around this.  She had a patent below-knee popliteal artery with three-vessel runoff.  Anterior tibial appeared to be dominant runoff.   Ultimately this lesion was crossed in the distal SFA/above knee popliteal artery and primarily stented with a 6 mm x 60 mm Eluvia.  She now has excellent inline flow down the left lower extremity with no residual stenosis and three-vessel runoff.  There was a focal perforation in the anterior tibial where our wire likely made a perforation during exchange of balloons and stents.  Ultimately this was treated with a very low inflation of a 3 mm Sterling for 3 minutes with resolution.  Patient has preserved three vessel runoff.              Procedure:  The patient was identified in the holding area and taken to room 8.  The patient  was then placed supine on the table and prepped and draped in the usual sterile fashion.  A time out was called.  Ultrasound was used to evaluate the right common femoral artery.  It was patent .  A digital ultrasound image was acquired.  A micropuncture needle was used to access the right common femoral artery under ultrasound  guidance.  An 018 wire was advanced without resistance and a micropuncture sheath was placed.  The 018 wire was removed and a benson wire was placed.  The micropuncture sheath was exchanged for a 5 french sheath.  An omniflush catheter was advanced over the wire to the level of L-1.  An abdominal angiogram was obtained.  Next, using glidewire and straight 4 French flush catheter the aortic bifurcation was cross because the Omni would not track.  Placed the catheter into theleft external iliac artery and left runoff was obtained.  Ultimately after evaluating the pictures elected to intervene on the distal left SFA above-knee popliteal occlusion.  Used a long Glidewire advantage down the SFA and exchanged for a 6 [place] Ansell sheath in the right groin over the aortic bifurcation.  Patient was given 100 units per kilogram heparin.  I then primarily stented this lesion with a 6 mm x 60 mm Eluvia that was postdilated with a 5 mm x 60 mm Mustang.  Final pictures showed no residual stenosis with excellent inline flow down the left lower extremity and three-vessel runoff.  There was a focal perforation in the proximal anterior tibial that I think was from our wire.  I then exchanged for a V 18 wire down the anterior tibial and then did a very slow inflation with a 3 mm Sterling in the left anterior tibial artery for 3 minutes.  There appeared to be resolution of the perforation after taking multiple additional pictures.  Wires and catheters removed and a short 6 French sheath was placed in the right groin.  We placed a mynx closure device.   Plan: Patient will be loaded on Plavix and will need to continue aspirin and Plavix.  Should have inline flow down left leg now.   Marty Heck, MD [facility] Office: [phone]   DG Foot Complete Left  Result Date: 01/05/2020 CLINICAL DATA:  Swelling EXAM: LEFT FOOT - COMPLETE 3+ VIEW COMPARISON:  None. FINDINGS: Degenerative changes  in the 1st MTP joint. No acute bony abnormality. Specifically, no fracture, subluxation, or dislocation. Vascular calcifications noted. No bone destruction seen to suggest osteomyelitis. IMPRESSION: No acute bony abnormality. Electronically Signed   By: Rolm Baptise M.D.   On: 01/05/2020 01:25   VAS [place] ABI WITH/WO TBI  Result Date: 01/05/2020 LOWER EXTREMITY DOPPLER STUDY Indications: Blue, cold toes. High Risk Factors: Hypertension, Diabetes.  Limitations: Today's exam was limited due to Patient body habitus. Comparison Study: No prior studies. Performing Technologist: Carlos Levering Rvt  Examination Guidelines: A complete evaluation includes at minimum, Doppler waveform signals and systolic blood pressure reading at the level of bilateral brachial, anterior tibial, and posterior tibial arteries, when vessel segments are accessible. Bilateral testing is considered an integral part of a complete examination. Photoelectric Plethysmograph (PPG) waveforms and toe systolic pressure readings are included as required and additional duplex testing as needed. Limited examinations for reoccurring indications may be performed as noted.  ABI Findings: +---------+------------------+-----+---------+--------+  Right     Rt Pressure (mmHg) Index Waveform  Comment   +---------+------------------+-----+---------+--------+  Brachial  150  triphasic           +---------+------------------+-----+---------+--------+  PTA       108                0.72  biphasic            +---------+------------------+-----+---------+--------+  DP        137                0.91  triphasic           +---------+------------------+-----+---------+--------+  Great Toe 76                 0.51                      +---------+------------------+-----+---------+--------+ +---------+------------------+-----+----------+-------+  Left      Lt Pressure (mmHg) Index Waveform   Comment  +---------+------------------+-----+----------+-------+   Brachial  136                      triphasic           +---------+------------------+-----+----------+-------+  PTA       66                 0.44  monophasic          +---------+------------------+-----+----------+-------+  DP        53                 0.35  monophasic          +---------+------------------+-----+----------+-------+  Great Toe 0                  0.00                      +---------+------------------+-----+----------+-------+ +-------+-----------+-----------+------------+------------+  ABI/TBI Today's ABI Today's TBI Previous ABI Previous TBI  +-------+-----------+-----------+------------+------------+  Right   0.91        0.51                                   +-------+-----------+-----------+------------+------------+  Left    0.44        0                                      +-------+-----------+-----------+------------+------------+  Summary: Right: Resting right ankle-brachial index indicates mild right lower extremity arterial disease. The right toe-brachial index is abnormal. Left: Resting left ankle-brachial index indicates severe left lower extremity arterial disease. The left toe-brachial index is abnormal.  *See table(s) above for measurements and observations.  Electronically signed by [name] MD on 01/05/2020 at 7:35:40 PM.    Final    VAS [place] LOWER EXTREMITY ARTERIAL DUPLEX  Result Date: 01/05/2020 LOWER EXTREMITY ARTERIAL DUPLEX STUDY Indications: Blue, cold toes. High Risk Factors: Hypertension, Diabetes.  Current ABI: R 0.91 L 0.44 Limitations: Patient body habitus, poor ultrasound/tissue interface. Comparison Study: No prior studies. Performing Technologist: Oliver Hum RVT  Examination Guidelines: A complete evaluation includes B-mode imaging, spectral Doppler, color Doppler, and power Doppler as needed of all accessible portions of each vessel. Bilateral testing is considered an integral part of a complete examination. Limited examinations for reoccurring  indications may be performed as noted.  +-----------+--------+-----+--------+----------+------------------+  LEFT        PSV cm/s Ratio Stenosis Waveform  Comments            +-----------+--------+-----+--------+----------+------------------+  EIA Distal  116                     biphasic                       +-----------+--------+-----+--------+----------+------------------+  CFA Mid     132                     biphasic                       +-----------+--------+-----+--------+----------+------------------+  DFA         111                     biphasic                       +-----------+--------+-----+--------+----------+------------------+  SFA Prox    104                     biphasic                       +-----------+--------+-----+--------+----------+------------------+  SFA Mid     85                      biphasic                       +-----------+--------+-----+--------+----------+------------------+  SFA Distal  91                      biphasic                       +-----------+--------+-----+--------+----------+------------------+  POP Prox    26                      monophasic                     +-----------+--------+-----+--------+----------+------------------+  POP Distal  0                                  Unable to insonate  +-----------+--------+-----+--------+----------+------------------+  ATA Prox    73                      biphasic                       +-----------+--------+-----+--------+----------+------------------+  ATA Mid     33                      monophasic                     +-----------+--------+-----+--------+----------+------------------+  ATA Distal  32                      monophasic                     +-----------+--------+-----+--------+----------+------------------+  PTA Prox  Unable to insonate  +-----------+--------+-----+--------+----------+------------------+  PTA Mid     27                      monophasic                      +-----------+--------+-----+--------+----------+------------------+  PTA Distal  9                       monophasic                     +-----------+--------+-----+--------+----------+------------------+  PERO Prox                                      Unable to insonate  +-----------+--------+-----+--------+----------+------------------+  PERO Mid                                       Unable to insonate  +-----------+--------+-----+--------+----------+------------------+  PERO Distal 15                      monophasic                     +-----------+--------+-----+--------+----------+------------------+  DP          29                      monophasic                     +-----------+--------+-----+--------+----------+------------------+  Summary: Left: The external iliac, common femoral, and superficial femoral arteries appear patent with no significant stenosis. Monophasic waveforms are noted in the proximal popliteal artery with poor visualization of the distal segment. Which suggests a possible occlusion. Anterior tibial artery appears patent throughout with monophasic flow. The posterior tibial, and peroneal arteries demonstrated monophasic flow with likely areas of occlusive disease. The proximal calf arteries were difficult to visualize.  See table(s) above for measurements and observations. Electronically signed by [name] MD on 01/05/2020 at 69:34:41 PM.    Final    VAS [place] LOWER EXTREMITY VENOUS (DVT)  Result Date: 01/13/2020  Lower Venous DVTStudy Indications: Swelling.  Limitations: Body habitus and poor ultrasound/tissue interface. Performing Technologist: Antonieta Pert RDMS, RVT  Examination Guidelines: A complete evaluation includes B-mode imaging, spectral Doppler, color Doppler, and power Doppler as needed of all accessible portions of each vessel. Bilateral testing is considered an integral part of a complete examination. Limited examinations for reoccurring indications may be performed  as noted. The reflux portion of the exam is performed with the patient in reverse Trendelenburg.  +---------+---------------+---------+-----------+----------+-------------------+  RIGHT     Compressibility Phasicity Spontaneity Properties Thrombus Aging       +---------+---------------+---------+-----------+----------+-------------------+  CFV       Partial         Yes       Yes                    patient body  habitus and                                                                      position limiting                                                                evaluation           +---------+---------------+---------+-----------+----------+-------------------+  SFJ       Full                                                                  +---------+---------------+---------+-----------+----------+-------------------+  FV Prox                             Yes                                         +---------+---------------+---------+-----------+----------+-------------------+  FV Mid                              Yes                                         +---------+---------------+---------+-----------+----------+-------------------+  FV Distal                           Yes                                         +---------+---------------+---------+-----------+----------+-------------------+  PFV       Full                                                                  +---------+---------------+---------+-----------+----------+-------------------+  POP       Full                                                                  +---------+---------------+---------+-----------+----------+-------------------+  PTV  not clearly                                                                      visualized            +---------+---------------+---------+-----------+----------+-------------------+  PERO                                                       not clearly                                                                      visualized           +---------+---------------+---------+-----------+----------+-------------------+   +---------+---------------+---------+-----------+----------+------------------+  LEFT      Compressibility Phasicity Spontaneity Properties Thrombus Aging      +---------+---------------+---------+-----------+----------+------------------+  CFV       Full            Yes       Yes                                        +---------+---------------+---------+-----------+----------+------------------+  SFJ       Full                                                                 +---------+---------------+---------+-----------+----------+------------------+  FV Prox   Full                                                                 +---------+---------------+---------+-----------+----------+------------------+  FV Mid    Full                                                                 +---------+---------------+---------+-----------+----------+------------------+  FV Distal                           Yes                                        +---------+---------------+---------+-----------+----------+------------------+  PFV       Full                                                                 +---------+---------------+---------+-----------+----------+------------------+  POP       Full            Yes       Yes                                        +---------+---------------+---------+-----------+----------+------------------+  PTV                                                        not clearly                                                                     visualized          +---------+---------------+---------+-----------+----------+------------------+  PERO                                                        not clearly                                                                     visualized          +---------+---------------+---------+-----------+----------+------------------+    Summary: RIGHT: - There is no evidence of deep vein thrombosis in the lower extremity. However, portions of this examination were limited- see technologist comments above.  LEFT: - There is no evidence of deep vein thrombosis in the lower extremity. However, portions of this examination were limited- see technologist comments above.  - Prominent lymph nodes noted bilateral groin areas.  *See table(s) above for measurements and observations. Electronically signed by Deitra Mayo MD on 01/13/2020 at 6:50:27 PM.    Final

## 2020-01-15 NOTE — Progress Notes (Signed)
Patient's O2 saturating and sustaining btween 88 and 89 on 2L [place].  O2 was increased to 3Lnc and RT notified.

## 2020-01-15 NOTE — Progress Notes (Signed)
  Echocardiogram 2D Echocardiogram has been performed.  Jennette Dubin 01/15/2020, 1:48 PM

## 2020-01-15 NOTE — Progress Notes (Signed)
OCCUPATIONAL THERAPY EVALUATION:  Clinical Impressions: PTA, pt lives at home with intermittent support. Pt's daughter stays with pt at night with [facility] aide assist for a few hours during the day. Pt with recent discharge home from hospital and reports having increasing difficulty with ADLs and mobility. Pt received now on 3 L O2 (93% at rest, 91% talking). Pt did require encouragement and education to participate. Pt overall Mod A/Max + 2 for bed mobility with difficulty advancing trunk to sit EOB. Once sitting EOB, pt demonstrated fair static sitting balance with no physical assist to maintain. While sitting EOB, O2 ranged from 87-92% on 3 L O2. Guided pt in sit to stand from bedside with min guard and cues for sequencing, Min A for stand pivot to recliner chair with RW. After transfer, pt desats to 80%, requiring 2 min to improve to high 80s. Pt with soft BP after transfer (see below), but did normalize after sitting upright in chair for 2-3 min. Pt unable to reach feet at this time, Max A to adjust socks. Pt also with increasing difficulty with urine incontinence. Recommend SNF for short term rehab based on current functional abilities and decreased caregiver support at home. Recommend larger manual wheelchair, as pt reports recently received wc may be too small (will need to verify w/ family). Will continue to follow acutely.      01/15/20 0800  OT Visit Information  Last OT Received On 01/15/20  Assistance Needed +1 (+2 for progression of mobility)  PT/OT/SLP Co-Evaluation/Treatment Yes  Reason for Co-Treatment Complexity of the patient's impairments (multi-system involvement);For patient/therapist safety  OT goals addressed during session ADL's and self-care  History of Present Illness 64 year old female with past medical history of chronic low back pain, chronic diastolic congestive heart failure, diabetes mellitus type 2, gout, hyperlipidemia, hypertension, obstructive sleep apnea and obesity who  presents to [facility] emergency department on 4/18 with complaints of left foot pain. LLE vascular ultrasound reveals occlusive disease of popliteal, posterior tibial, and peroneal arteries.  Precautions  Precautions Fall;Other (comment)  Precaution Comments 255mL fluid restriction, elevate LEs  Restrictions  Weight Bearing Restrictions No  Home Living  Family/patient expects to be discharged to: Private residence  Living Arrangements Children  Available Help at Discharge Family;Available PRN/intermittently (daughter works days, at home with pt at night)  Type of Home Apartment  Home Access Level entry  [place] One level  Bathroom Shower/Tub Tub/shower unit  English as a second language teacher;Wheelchair - [employer] - 2 wheels;Cane - single point;Walker - 4 wheels  Additional Comments Pt reports getting wc prior to DC home last time - reports this wc is too small   Prior Function  Level of Independence Needs assistance  Gait / Transfers Assistance Needed Pt reports using "whatever she could to get around", wheelchair, walker, holding on to people  ADL's / [place] has an aide for a couple hours a day (18 hours a week), 7 days a week. Assists with dressing, bathing in tub, cooking, and cleaning  Comments After most recent dc from hospital, pt reports staying in bed, soaked with urine. Pt also reports not putting much weight through leg due to pain   Communication  Communication No difficulties;HOH (pt reports HOH)  Pain Assessment  Pain Assessment Faces  Faces Pain Scale 0  Cognition  Arousal/Alertness Awake/alert  Behavior During Therapy Flat affect  Overall Cognitive Status No family/caregiver present to determine baseline cognitive functioning  General Comments Pt requires  increased time to respond to cues, time between tasks  Upper Extremity Assessment  Upper Extremity Assessment Generalized weakness  Lower Extremity  Assessment  Lower Extremity Assessment Defer to PT evaluation  Cervical / Trunk Assessment  Cervical / Trunk Assessment Normal  ADL  Overall ADL's  Needs assistance/impaired  Eating/Feeding Sitting;Set up  Eating/Feeding Details (indicate cue type and reason) Pt reporting difficulty reaching items on tray table, so items placed closer to pt once in chair. However, pt observed reaching B UE out to walker and tray table during session  Grooming Minimal assistance;Standing  Upper Body Bathing Sitting;Maximal assistance  Lower Body Bathing Sit to/from stand;Moderate assistance  Upper Body Dressing  Sitting;Minimal assistance  Lower Body Dressing Maximal assistance;Sit to/from Retail buyer Minimal assistance;RW;Stand-pivot  Toileting- Clothing Manipulation and Hygiene Moderate assistance;Sitting/lateral lean;Sit to/from stand  General ADL Comments Pt with decreased overall endurance, strength, and cardiopulmonary tolerace impacting ability to complete ADLs/functional transfers without physical assist. Pt also with self limiting behaviors at times, reporting "I cant" during tasks, but then demonstrates ability to complete.    Vision- History  Baseline Vision/History Wears glasses  Bed Mobility  Overal bed mobility Needs Assistance  Bed Mobility Rolling;Sidelying to Sit  Rolling Mod assist;+2 for physical assistance  Sidelying to sit Mod assist;Max assist;+2 for physical assistance  General bed mobility comments Heavy use of bedrail, able to advance B LE to EOB, but requiring increased assistance to advance trunk upright. Provided cues to pt to assist with pushing self up.   Transfers  Overall transfer level Needs assistance  Equipment used Rolling walker (2 wheeled)  Transfers Sit to/from [facility]  Sit to Stand Min guard  Stand pivot transfers Min assist;+2 physical assistance;+2 safety/equipment  General transfer comment Min A for manuevering of RW, cues for safe  sequencing   Balance  Overall balance assessment Needs assistance;History of Falls  Sitting-balance support Feet supported;No upper extremity supported  Sitting balance-Leahy Scale Fair  Standing balance support During functional activity;Bilateral upper extremity supported  Standing balance-Leahy Scale Fair  Standing balance comment min guard for safety with static standing, physical assist provided for maintaining balance with transfer  General Comments  General comments (skin integrity, edema, etc.) Pt on 3 L O2, desats to 87% sitting EOB increasing to 92% at times with cues for pursed lip breathing. Pt desats to 80% after transfer on 3 L, requiring around 2 min for recovery to high 80s. Pt reporting mild dizziness with postural changes, assessed BP sitting EOB at 128/62, 101/45 after transfer to chair, and 129/53 after sititng in recliner for 2-3 minutes.    OT - End of Session  Equipment Utilized During Treatment Gait belt;Rolling walker;Oxygen  Activity Tolerance Patient limited by fatigue;Patient tolerated treatment well  Patient left in chair;with call bell/phone within reach;with chair alarm set  Nurse Communication Mobility status  OT Assessment  OT Recommendation/Assessment Patient needs continued OT Services  OT Visit Diagnosis Unsteadiness on feet (R26.81);History of falling (Z91.81);Muscle weakness (generalized) (M62.81)  OT Problem List Decreased strength;Decreased activity tolerance;Impaired balance (sitting and/or standing);Decreased safety awareness;Decreased knowledge of use of DME or AE;Cardiopulmonary status limiting activity  OT Plan  OT Frequency (ACUTE ONLY) Min 2X/week  OT Treatment/Interventions (ACUTE ONLY) Self-care/ADL training;Therapeutic exercise;Energy conservation;DME and/or AE instruction;Therapeutic activities;Patient/family education  AM-PAC OT "6 Clicks" Daily Activity Outcome Measure (Version 2)  Help from another person eating meals? 3  Help from another  person taking care of personal grooming? 3  Help from another person toileting,  which includes using toliet, bedpan, or urinal? 3  Help from another person bathing (including washing, rinsing, drying)? 2  Help from another person to put on and taking off regular upper body clothing? 3  Help from another person to put on and taking off regular lower body clothing? 2  6 Click Score 16  OT Recommendation  Follow Up Recommendations SNF;Supervision/Assistance - 24 hour  OT Equipment 3 in 1 bedside commode  Individuals Consulted  Consulted and Agree with Results and Recommendations Patient  Acute Rehab OT Goals  Patient Stated Goal get better  OT Goal Formulation With patient  Time For Goal Achievement 01/29/20  Potential to Achieve Goals Good  OT Time Calculation  OT Start Time (ACUTE ONLY) 0836  OT Stop Time (ACUTE ONLY) 0921  OT Time Calculation (min) 45 min  OT General Charges  $OT Visit 1 Visit  OT Evaluation  $OT Eval Moderate Complexity 1 Mod  OT Treatments  $Therapeutic Activity 8-22 mins  Written Expression  Dominant Hand Right

## 2020-01-15 NOTE — Plan of Care (Signed)
  Problem: Clinical Measurements: Goal: Will remain free from infection Outcome: Progressing   Problem: Activity: Goal: Risk for activity intolerance will decrease Outcome: Progressing   Problem: Pain Managment: Goal: General experience of comfort will improve Outcome: Progressing   Problem: Safety: Goal: Ability to remain free from injury will improve Outcome: Progressing   

## 2020-01-15 NOTE — Progress Notes (Signed)
Inpatient Diabetes Program Recommendations  AACE/ADA: New Consensus Statement on Inpatient Glycemic Control (2015)  Target Ranges:  Prepandial:   less than 140 mg/dL      Peak postprandial:   less than 180 mg/dL (1-2 hours)      Critically ill patients:  140 - 180 mg/dL   Lab Results  Component Value Date   GLUCAP 230 (H) 01/15/2020   HGBA1C 12.2 (H) 01/05/2020    Review of Glycemic Control Results for ARMA, SCHMUCKER (MRN [id number]) as of 01/15/2020 10:51  Ref. Range 01/14/2020 12:23 01/14/2020 17:12 01/14/2020 21:53 01/15/2020 07:52  Glucose-Capillary Latest Ref Range: 70 - 99 mg/dL 170 (H) 177 (H) 264 (H) 230 (H)   Diabetes history: Type 2 DM Outpatient Diabetes medications: Novolog 3-20 units TID, Lantus 40 units QAM, Victoza 1.9 mg QD Current orders for Inpatient glycemic control: Tradjenta 5 mg QD, Lantus 22 units QD, Novolog 0-20 units TID, Novolog 0-5 units QHS Solumedrol 40 mg BID  Inpatient Diabetes Program Recommendations:    Consider increasing Levemir to 26 units BID.   Thanks, Bronson Curb, MSN, RNC-OB Diabetes Coordinator [phone] (8a-5p)

## 2020-01-16 ENCOUNTER — Inpatient Hospital Stay (HOSPITAL_COMMUNITY): Payer: BC Managed Care – PPO

## 2020-01-16 LAB — COMPREHENSIVE METABOLIC PANEL
ALT: 32 U/L (ref 0–44)
AST: 60 U/L — ABNORMAL HIGH (ref 15–41)
Albumin: 2.2 g/dL — ABNORMAL LOW (ref 3.5–5.0)
Alkaline Phosphatase: 59 U/L (ref 38–126)
Anion gap: 11 (ref 5–15)
BUN: 81 mg/dL — ABNORMAL HIGH (ref 8–23)
CO2: 20 mmol/L — ABNORMAL LOW (ref 22–32)
Calcium: 8.3 mg/dL — ABNORMAL LOW (ref 8.9–10.3)
Chloride: 109 mmol/L (ref 98–111)
Creatinine, Ser: 2.96 mg/dL — ABNORMAL HIGH (ref 0.44–1.00)
GFR calc Af Amer: 19 mL/min — ABNORMAL LOW (ref >60)
GFR calc non Af Amer: 16 mL/min — ABNORMAL LOW (ref >60)
Glucose, Bld: 217 mg/dL — ABNORMAL HIGH (ref 70–99)
Potassium: 4.7 mmol/L (ref 3.5–5.1)
Sodium: 140 mmol/L (ref 135–145)
Total Bilirubin: 0.9 mg/dL (ref 0.3–1.2)
Total Protein: 5.7 g/dL — ABNORMAL LOW (ref 6.5–8.1)

## 2020-01-16 LAB — D-DIMER, QUANTITATIVE: D-Dimer, Quant: 3.05 ug/mL-FEU — ABNORMAL HIGH (ref 0.00–0.50)

## 2020-01-16 LAB — GLUCOSE, CAPILLARY
Glucose-Capillary: 187 mg/dL — ABNORMAL HIGH (ref 70–99)
Glucose-Capillary: 212 mg/dL — ABNORMAL HIGH (ref 70–99)
Glucose-Capillary: 203 mg/dL — ABNORMAL HIGH (ref 70–99)
Glucose-Capillary: 184 mg/dL — ABNORMAL HIGH (ref 70–99)

## 2020-01-16 LAB — C-REACTIVE PROTEIN: CRP: 9.3 mg/dL — ABNORMAL HIGH (ref <1.0)

## 2020-01-16 LAB — CBC WITH DIFFERENTIAL/PLATELET
Abs Immature Granulocytes: 0.18 10*3/uL — ABNORMAL HIGH (ref 0.00–0.07)
Basophils Absolute: 0 10*3/uL (ref 0.0–0.1)
Basophils Relative: 0 %
Eosinophils Absolute: 0 10*3/uL (ref 0.0–0.5)
Eosinophils Relative: 0 %
HCT: 30.9 % — ABNORMAL LOW (ref 36.0–46.0)
Hemoglobin: 10 g/dL — ABNORMAL LOW (ref 12.0–15.0)
Immature Granulocytes: 1 %
Lymphocytes Relative: 9 %
Lymphs Abs: 1.3 10*3/uL (ref 0.7–4.0)
MCH: 29.9 pg (ref 26.0–34.0)
MCHC: 32.4 g/dL (ref 30.0–36.0)
MCV: 92.2 fL (ref 80.0–100.0)
Monocytes Absolute: 0.8 10*3/uL (ref 0.1–1.0)
Monocytes Relative: 5 %
Neutro Abs: 12.4 10*3/uL — ABNORMAL HIGH (ref 1.7–7.7)
Neutrophils Relative %: 85 %
Platelets: 273 10*3/uL (ref 150–400)
RBC: 3.35 MIL/uL — ABNORMAL LOW (ref 3.87–5.11)
RDW: 16.9 % — ABNORMAL HIGH (ref 11.5–15.5)
WBC: 14.7 10*3/uL — ABNORMAL HIGH (ref 4.0–10.5)
nRBC: 0 % (ref 0.0–0.2)

## 2020-01-16 LAB — MAGNESIUM: Magnesium: 2.1 mg/dL (ref 1.7–2.4)

## 2020-01-16 LAB — BRAIN NATRIURETIC PEPTIDE: B Natriuretic Peptide: 116.5 pg/mL — ABNORMAL HIGH (ref 0.0–100.0)

## 2020-01-16 LAB — FERRITIN: Ferritin: 1544 ng/mL — ABNORMAL HIGH (ref 11–307)

## 2020-01-16 MED ORDER — HEPARIN SODIUM (PORCINE) 5000 UNIT/ML IJ SOLN
5000.0000 [IU] | Freq: Three times a day (TID) | INTRAMUSCULAR | Status: DC
  Administered 2020-01-16 – 2020-01-26 (×29): 5000 [IU] via SUBCUTANEOUS
  Filled 2020-01-16 (×30): qty 1

## 2020-01-16 MED ORDER — FUROSEMIDE 10 MG/ML IJ SOLN: 80.0000 mg | Freq: Once | INTRAMUSCULAR | Status: DC

## 2020-01-16 MED ORDER — TOCILIZUMAB 400 MG/20ML IV SOLN
800.0000 mg | Freq: Once | INTRAVENOUS | Status: AC
  Administered 2020-01-16: 800 mg via INTRAVENOUS
  Filled 2020-01-16: qty 40

## 2020-01-16 MED ORDER — PANTOPRAZOLE SODIUM 40 MG PO TBEC
40.0000 mg | DELAYED_RELEASE_TABLET | Freq: Every day | ORAL | Status: DC
  Administered 2020-01-16 – 2020-01-27 (×12): 40 mg via ORAL
  Filled 2020-01-16 (×13): qty 1

## 2020-01-16 MED ORDER — HEPARIN BOLUS VIA INFUSION
2500.0000 [IU] | Freq: Once | INTRAVENOUS | Status: AC
  Administered 2020-01-16: 2500 [IU] via INTRAVENOUS
  Filled 2020-01-16: qty 2500

## 2020-01-16 MED ORDER — HEPARIN (PORCINE) 25000 UT/250ML-% IV SOLN
1200.0000 [IU]/h | INTRAVENOUS | Status: DC
  Administered 2020-01-16: 1200 [IU]/h via INTRAVENOUS
  Filled 2020-01-16: qty 250

## 2020-01-16 MED ORDER — TECHNETIUM TO 99M ALBUMIN AGGREGATED
1.6000 | Freq: Once | INTRAVENOUS | Status: AC | PRN
  Administered 2020-01-16: 1.6 via INTRAVENOUS

## 2020-01-16 NOTE — Consult Note (Signed)
[facility] Renal Consultation Note  Requesting MD: Carmell Austria, MD  Indication for Consultation:  AKI   Chief complaint: shortness of breath   HPI:  [name] is a 64 y.o. female with a history of CKD, hypertension and chronic diastolic CHF who presented to the hospital with shortness of breath and fluid overload.  She was found to have Covid pneumonia.  Note that she did have a recent admission with critical limb ischemia of the left lower extremity and received arteriogram and angioplasty with stent placement at that time.  She follows with Dr. Marval Regal for her CKD.  Note Cr 1.55 in 11/2019 at [facility] per last admission's consult note.  Her Cr was 2.48 on 4/20 and trended down after that prior to discharge.  She was on lasix 80 mg IV BID - none on 4/29 or today thus far.   She has received colchicine daily.  Got vanc last on 4/27.  She has not been on lisinopril at 5 mg daily at home - she was afraid to start back and it has been held here.  Not altered but she is able to supplement history easier than she can provide it due to resp status.  Her nurse has had her today and yesterday and breathing seems about the same per nurse.   Creat  Date/Time Value Ref Range Status  01/22/2013 07:49 PM 1.19 (H) 0.50 - 1.10 mg/dL Final   Creatinine, Ser  Date/Time Value Ref Range Status  01/16/2020 06:32 AM 2.96 (H) 0.44 - 1.00 mg/dL Final  01/15/2020 05:28 AM 2.48 (H) 0.44 - 1.00 mg/dL Final  01/14/2020 03:07 AM 2.08 (H) 0.44 - 1.00 mg/dL Final  01/13/2020 12:02 AM 2.04 (H) 0.44 - 1.00 mg/dL Final  01/09/2020 03:08 AM 1.87 (H) 0.44 - 1.00 mg/dL Final  01/08/2020 03:03 AM 1.92 (H) 0.44 - 1.00 mg/dL Final  01/07/2020 02:25 AM 2.18 (H) 0.44 - 1.00 mg/dL Final  01/06/2020 03:45 AM 2.48 (H) 0.44 - 1.00 mg/dL Final  01/04/2020 10:27 PM 2.02 (H) 0.44 - 1.00 mg/dL Final  04/09/2018 06:04 PM 1.54 (H) 0.44 - 1.00 mg/dL Final  10/17/2017 09:56 AM 2.13 (H) 0.44 - 1.00 mg/dL Final   10/09/2017 10:32 AM 2.35 (H) 0.44 - 1.00 mg/dL Final  09/24/2017 11:44 AM 2.59 (H) 0.44 - 1.00 mg/dL Final  07/06/2017 10:26 AM 1.65 (H) 0.44 - 1.00 mg/dL Final  07/04/2017 09:26 AM 1.65 (H) 0.44 - 1.00 mg/dL Final  07/02/2017 02:58 AM 1.79 (H) 0.44 - 1.00 mg/dL Final  07/01/2017 07:35 PM 1.86 (H) 0.44 - 1.00 mg/dL Final  06/25/2017 09:49 AM 2.18 (H) 0.44 - 1.00 mg/dL Final  06/15/2017 11:16 AM 1.64 (H) 0.44 - 1.00 mg/dL Final  06/07/2017 02:44 PM 1.80 (H) 0.44 - 1.00 mg/dL Final  06/01/2017 11:06 AM 1.93 (H) 0.44 - 1.00 mg/dL Final  05/24/2017 09:57 AM 1.53 (H) 0.44 - 1.00 mg/dL Final  03/22/2017 02:49 PM 1.58 (H) 0.44 - 1.00 mg/dL Final  02/15/2017 02:23 PM 1.62 (H) 0.44 - 1.00 mg/dL Final  02/01/2017 11:37 AM 1.82 (H) 0.44 - 1.00 mg/dL Final  01/18/2017 12:09 PM 1.39 (H) 0.57 - 1.00 mg/dL Final  12/15/2016 12:19 PM 1.38 (H) 0.57 - 1.00 mg/dL Final  12/07/2016 04:33 AM 1.60 (H) 0.44 - 1.00 mg/dL Final  12/06/2016 11:27 AM 1.61 (H) 0.44 - 1.00 mg/dL Final  12/06/2016 05:17 AM 1.86 (H) 0.44 - 1.00 mg/dL Final  12/05/2016 04:16 AM 1.89 (H) 0.44 - 1.00 mg/dL Final  12/04/2016  05:23 AM 1.63 (H) 0.44 - 1.00 mg/dL Final  12/03/2016 04:56 AM 1.51 (H) 0.44 - 1.00 mg/dL Final  12/02/2016 04:36 AM 1.57 (H) 0.44 - 1.00 mg/dL Final  12/01/2016 05:40 AM 1.41 (H) 0.44 - 1.00 mg/dL Final  11/30/2016 06:26 PM 1.44 (H) 0.44 - 1.00 mg/dL Final  09/06/2016 05:44 AM 1.47 (H) 0.44 - 1.00 mg/dL Final  09/05/2016 03:25 AM 1.44 (H) 0.44 - 1.00 mg/dL Final  09/04/2016 12:58 PM 1.38 (H) 0.44 - 1.00 mg/dL Final  11/09/2015 05:32 PM 1.26 (H) 0.44 - 1.00 mg/dL Final  10/08/2015 06:16 PM 1.01 (H) 0.44 - 1.00 mg/dL Final  11/11/2014 11:25 AM 1.02 0.50 - 1.10 mg/dL Final  06/17/2014 09:34 PM 1.03 0.50 - 1.10 mg/dL Final  01/13/2014 11:44 AM 1.10 0.50 - 1.10 mg/dL Final  12/19/2012 12:02 AM 1.10 0.50 - 1.10 mg/dL Final  07/09/2012 01:43 PM 1.10 0.50 - 1.10 mg/dL Final  12/14/2011 09:00 PM 0.98 0.50 - 1.10 mg/dL  Final  11/05/2010 11:00 PM 1.13 0.4 - 1.2 mg/dL Final  08/24/2010 11:47 AM 0.96 0.4 - 1.2 mg/dL Final  05/02/2010 09:27 PM 0.98 0.40 - 1.20 mg/dL Final  07/02/2009 09:38 PM 1.04 0.40 - 1.20 mg/dL Final  04/20/2009 09:35 AM 0.9 0.4 - 1.2 mg/dL Final     PMHx:   Past Medical History:  Diagnosis Date  . Achilles rupture   . Anxiety   . Arthritis   . Asthma   . Chronic back pain   . Chronic diastolic heart failure ([place]) 11/30/2016  . Chronic headaches   . Chronic leg pain    bilaterally  . CHRONIC OBSTRUCTIVE PULMONARY DISEASE, MODERATE 03/26/2010   Annotation: with moderate restrictive lung disease as well per PFTs  04/02/07--actual numbers not noted in old records Qualifier: Diagnosis of  By: Amil Amen MD, Benjamine Mola    . Depression   . Diabetes mellitus 1999   T2DM  . Glaucoma   . Gout   . H/O gestational diabetes mellitus, not currently pregnant   . Hyperlipidemia   . Hypertension   . Morbid obesity ([place])   . Obstructive sleep apnea on CPAP 07/02/2009   Qualifier: Diagnosis of  By: Amil Amen MD, Benjamine Mola    . OSA (obstructive sleep apnea)   . Sciatica     Past Surgical History:  Procedure Laterality Date  . ABDOMINAL AORTOGRAM W/LOWER EXTREMITY Bilateral 01/08/2020   Procedure: ABDOMINAL AORTOGRAM W/LOWER EXTREMITY;  Surgeon: Marty Heck, MD;  Location: [facility];  Service: Cardiovascular;  Laterality: Bilateral;  . BACK SURGERY    . BREAST EXCISIONAL BIOPSY Right   . CERVICAL SPINE SURGERY    . CESAREAN SECTION  12/10/89  . CHOLECYSTECTOMY  1991  . LUMBAR SPINE SURGERY    . PERIPHERAL VASCULAR INTERVENTION Left 01/08/2020   Procedure: PERIPHERAL VASCULAR INTERVENTION;  Surgeon: Marty Heck, MD;  Location: [facility];  Service: Cardiovascular;  Laterality: Left;  . RIGHT HEART CATH N/A 12/06/2016   Procedure: Right Heart Cath;  Surgeon: Larey Dresser, MD;  Location: [facility];  Service: Cardiovascular;  Laterality: N/A;  . TUBAL  LIGATION      Family Hx:  Family History  Problem Relation Age of Onset  . Heart failure Mother   . Renal Disease Mother     Social History:  reports that she quit smoking about 21 years ago. Her smoking use included cigarettes. She has never used smokeless tobacco. She reports current alcohol use. She reports that  she does not use drugs.  Allergies:  Allergies  Allergen Reactions  . Pork Allergy   . Pork-Derived Products Nausea And Vomiting    Medications: Prior to Admission medications   Medication Sig Start Date End Date Taking? Authorizing Provider  acetaminophen (TYLENOL) 500 MG tablet Take 500-1,000 mg by mouth every 6 (six) hours as needed for mild pain or headache.    Yes [provider]  ALPHAGAN P 0.15 % ophthalmic solution Place 1 drop into both eyes 2 (two) times daily.  07/03/19  Yes [provider]  aspirin EC 81 MG tablet Take 81 mg by mouth daily with breakfast.   Yes [provider]  atorvastatin (LIPITOR) 40 MG tablet Take 1 tablet (40 mg total) by mouth daily. 01/09/20 02/08/20 Yes Donne Hazel, MD  Cholecalciferol (VITAMIN D) 50 MCG (2000 UT) tablet Take 2,000 Units by mouth daily.   Yes [provider]  clopidogrel (PLAVIX) 75 MG tablet Take 1 tablet (75 mg total) by mouth daily. 01/10/20 02/09/20 Yes Donne Hazel, MD  colchicine 0.6 MG tablet Take 0.6 mg by mouth daily.   Yes [provider]  dorzolamide-timolol (COSOPT) 22.3-6.8 MG/ML ophthalmic solution Place 1 drop into both eyes 2 (two) times daily.   Yes [provider]  doxazosin (CARDURA) 4 MG tablet Take 4 mg by mouth at bedtime.   Yes [provider]  insulin aspart (NOVOLOG FLEXPEN) 100 UNIT/ML FlexPen Inject 3-20 Units into the skin 3 (three) times daily as needed for high blood sugar (CBG 150).   Yes [provider]  insulin glargine (LANTUS) 100 unit/mL SOPN Inject 40 Units into the skin daily before breakfast.    Yes [provider]  liraglutide (VICTOZA) 18 MG/3ML SOPN Inject 1.8 mg into the skin daily.    Yes [provider]  lisinopril (PRINIVIL,ZESTRIL) 5 MG tablet Take 5 mg by mouth daily with breakfast.  06/18/17  Yes [provider]  oxyCODONE-acetaminophen (PERCOCET/ROXICET) 5-325 MG tablet Take 1-2 tablets by mouth every 6 (six) hours as needed for moderate pain. 01/09/20  Yes Donne Hazel, MD  prednisoLONE acetate (PRED FORTE) 1 % ophthalmic suspension Place 1 drop into the right eye in the morning and at bedtime. 01/01/20  Yes [provider]  [place] into the lungs at bedtime. CPAP   Yes [provider]  torsemide (DEMADEX) 20 MG tablet Take 3 tablets (60 mg total) by mouth as directed. Take 60 mg in the AM. You may take 60 mg in the PM as needed for swelling Patient taking differently: Take 40 mg by mouth daily.  12/04/17  Yes Josue Hector, MD  Travoprost, BAK Free, (TRAVATAN) 0.004 % SOLN ophthalmic solution Place 1 drop into both eyes at bedtime.   Yes [provider]  valACYclovir (VALTREX) 1000 MG tablet Take 1,000 mg by mouth 3 (three) times daily. 09/29/19  Yes [provider]  albuterol (PROVENTIL HFA;VENTOLIN HFA) 108 (90 BASE) MCG/ACT inhaler Inhale 2 puffs into the lungs every 6 (six) hours as needed for wheezing or shortness of breath.     Robyn Haber, MD  Vitamin D, Ergocalciferol, (DRISDOL) 50000 units CAPS capsule Take 1 capsule (50,000 Units total) by mouth every 7 (seven) days. Patient not taking: Reported on 01/05/2020 09/13/17   Adora Fridge, RPH-CPP    I have reviewed the patient's current and reported prior to admission medications.  Labs:  BMP Latest Ref Rng & Units 01/16/2020 01/15/2020 01/14/2020  Glucose 70 - 99 mg/dL 217(H) 253(H) 196(H)  BUN 8 - 23 mg/dL 81(H) 63(H) 44(H)  Creatinine 0.44 - 1.00 mg/dL 2.96(H) 2.48(H) 2.08(H)  BUN/Creat Ratio 12 - 28 - - -  Sodium 135 - 145 mmol/L 140 140  140  Potassium 3.5 - 5.1 mmol/L 4.7 4.7 4.5  Chloride 98 - 111 mmol/L 109 109 109  CO2 22 - 32 mmol/L 20(L) 19(L) 19(L)  Calcium 8.9 - 10.3 mg/dL 8.3(L) 8.2(L) 8.1(L)    Urinalysis    Component Value Date/Time   COLORURINE YELLOW 01/06/2020 1400   APPEARANCEUR CLOUDY (A) 01/06/2020 1400   LABSPEC 1.014 01/06/2020 1400   PHURINE 5.0 01/06/2020 1400   GLUCOSEU NEGATIVE 01/06/2020 1400   HGBUR NEGATIVE 01/06/2020 1400   HGBUR negative 08/20/2009 0807   BILIRUBINUR NEGATIVE 01/06/2020 1400   BILIRUBINUR neg 05/14/2013 1111   KETONESUR NEGATIVE 01/06/2020 1400   PROTEINUR NEGATIVE 01/06/2020 1400   UROBILINOGEN 1.0 11/11/2014 1215   NITRITE POSITIVE (A) 01/06/2020 1400   LEUKOCYTESUR LARGE (A) 01/06/2020 1400     ROS:  Reviewed but limited history provided by patient secondary to respiratory status.   Physical Exam: Vitals:   01/16/20 0321 01/16/20 0825  BP: 128/62 (!) 135/59  Pulse: (!) 57 (!) 57  Resp: (!) 25 (!) 21  Temp: 97.8 F (36.6 C) 97.7 F (36.5 C)  SpO2: 96% 95%     General: adult female in bed   HEENT: NCAT  Eyes: EOMI sclera anicteric Neck: supple trachea midline Heart: S1S2 no rub Lungs: reduced breath sounds on auscultation; no overt crackles; increased work of breathing with speech on 4 liters oxygen  Abdomen: softly distended/obese nt Extremities:  Trace edema bilaterally  Skin: no rash on extremities exposed Neuro: alert and oriented x 3; provides history easier by answering yes/no questions to confirm information.    Assessment/Plan:  # AKI - Multifactorial prerenal insults.  Hx diuretic administration to optimize resp status.  Recent history of AKI with critical limb ischemia and contrast last admission.  - lasix conservatively as needed for respiratory status - lasix once now  - noted CMP is ordered for AM - Would avoid colchicine and judicious use of contrast - UA and up/cr ratio  # CKD stage III - Noted that her cr was 1.55 in 11/2019  at [facility] per consult note last admission  -She follows with Dr. Marval Regal at [facility]  # Covid PNA -Therapies per primary team  # Acute hypoxic resp failure - covid and CHF contributing  - volume management as above  # Chronic diastolic CHF - lasix once now and as needed for resp distress   - weight trends for this admission and last: 102.5kg on 4/18, 115.6kg on 4/22 then 113.1 kg on 4/28 and 109.8 kg on 4/30.  Improving this admission still above her presentation last admission  # Normocytic anemia  -No acute indication for packed red blood cells or ESA   Claudia Desanctis 01/16/2020, 6:17 PM

## 2020-01-16 NOTE — Progress Notes (Addendum)
PROGRESS NOTE                                                                                                                                                                                                             Patient Demographics:    [name], is a 64 y.o. female, DOB - 03-17-1956, ML:[id number]  Outpatient Primary MD for the patient is Pavelock, Ralene Bathe, MD   Admit date - 01/04/2020   LOS - 3  Chief Complaint  Patient presents with  . Shortness of Breath  . Chest Pain       Brief Narrative: Patient is a 64 y.o. female  PMHx of hypertension, HLD, DM-2, CKD stage IV, PVD s/p stent to left distal SFA on [id number], chronic diastolic heart failure-presented with shortness of breath or cough-found to have acute hypoxic respiratory failure secondary to COVID-19 pneumonia and decompensated diastolic heart failure.  Significant Events: 4/18-4/23>> admit to [facility]-s/p stent to left distal SFA 4/27>> admit to [facility] for hypoxia from Covid/heart failure  COVID-19 medications: Steroids: 4/27>> Remdesivir: 4/27>>  Antibiotics: Vancomycin: 4/27 x 1 Cefepime: 4/27 x 1  Microbiology data: 4/27: Blood culture>> no growth 4/28: Blood culture>> no growth  DVT prophylaxis: SQ Lovenox  Procedures: None  Consults: None    Subjective:   Short of breath with minimal activity-she is slow to respond today but does respond appropriately.  Oxygen requirements increased-anywhere from 5 to-8 L overnight.  She does acknowledge that she has OSA but has not been very compliant with CPAP.   Assessment  & Plan :   Acute Hypoxic Resp Failure due to Covid 19 Viral pneumonia and decompensated diastolic heart failure: Worsening hypoxemia overnight-her volume status has improved compared to admission-although she still has 1+ pitting edema.  Suspect most of this hypoxia is driven by [id number] pneumonia-she does have OSA at  baseline-and has been noncompliant to CPAP-and hence this may be playing a role as well.  Echocardiogram done on 4/29 shows significant RV dysfunction (new)-lower extremity Dopplers are negative-although I suspect this is probably chronic (likely from OSA)-but will go ahead and get a VQ scan to make sure she does not have a PE.  In the meantime-we will start IV heparin-and give 1 dose of Actemra.  Remains on steroids and remdesivir.  COVID-19 pneumonia: See above-worsening hypoxemia-continue steroids/remdesivir-did consent to the  off label use of Actemra yesterday-discussed again this morning-rationale, risks, benefits discussed in detail-she consents to the use of Actemra.  Continue to watch closely.  Fever: afebrile  O2 requirements:  SpO2: 95 % O2 Flow Rate (L/min): 5 L/min   COVID-19 Labs: Recent Labs    01/13/20 2200 01/13/20 2200 01/14/20 0307 01/15/20 0528 01/16/20 0632  DDIMER 3.70*   < > 3.34* 2.30* 3.05*  FERRITIN 1,334*   < > 1,474* 1,624* 1,544*  LDH 627*  --   --   --   --   CRP 21.7*   < > 21.2* 14.5* 9.3*   < > = values in this interval not displayed.       Component Value Date/Time   BNP 193.8 (H) 01/13/2020 0002    Recent Labs  Lab 01/13/20 2200  PROCALCITON 0.21    Lab Results  Component Value Date   SARSCOV2NAA POSITIVE (A) 01/13/2020   [place] NEGATIVE 01/05/2020   [place] Not Detected 11/24/2019     Prone/Incentive Spirometry: encouraged  incentive spirometry use 3-4/hour.  Decompensated diastolic heart failure: Volume status improved compared to admission-less edema in her lower extremities-due to worsening renal function-diuretics on hold.  Suspect worsening hypoxemia is likely secondary to COVID-19-see above.  Continue to follow weights, electrolytes and volume status closely.  Pulmonary hypertension/RV dysfunction: Not seen in prior echo's-I suspect this is probably from OSA-she has been noncompliant to CPAP, but given her worsening  hypoxemia-we will rule out VQ scan to rule out PE.  Lower extremity Dopplers negative.  We will go ahead and place her on empiric IV heparin until then.  AKI on CKD stage IV: Creatinine continues to worsen-check bladder scan-obtain renal ultrasound.  Suspect AKI is related to hemodynamically mediated kidney injury in the setting of severe COVID-19 pneumonia-heart failure.  Have consulted nephrology-we will await further input.  Acute metabolic encephalopathy: Slightly lethargic today compared to the past few days-slow to respond-but is still awake and alert.  Suspect this is related to worsening renal failure and hypoxemia.  Supportive care.  Note has a nonfocal exam.  Elevated troponin: Trend is flat-not consistent with ACS-likely secondary to underlying CKD-with some component from demand ischemia due to COVID-19/heart failure.  PAD-s/p stent to left SFA on 4/22: Both lower extremities appear warm-continue aspirin, Plavix and statin.  HTN: BP stable-continue Cardura-Lasix and lisinopril on hold.    DM-2 with hyperglycemia (A1c 12.2 on 4/19): CBGs relatively stable-continue Lantus 26 units twice daily-SSI.  Follow and optimize.  Recent Labs    01/15/20 1707 01/15/20 2246 01/16/20 0745  GLUCAP 214* 198* 187*    Thrombocytopenia: Resolved.  Anemia of chronic disease: New baseline-likely related to CKD.  Gout: No flare-continue colchicine  OSA: Noncompliant to CPAP-continue CPAP nightly if possible in the hospital.  Obesity: Estimated body mass index is 45.74 kg/m as calculated from the following:   Height as of this encounter: 5\' 1"  (1.549 m).   Weight as of this encounter: 109.8 kg.   ABG:    Component Value Date/Time   PHART 7.460 (H) 09/30/2008 1120   PCO2ART 41.0 09/30/2008 1120   PO2ART 83.0 09/30/2008 1120   HCO3 26.4 12/06/2016 0922   HCO3 26.1 12/06/2016 0922   TCO2 28 12/06/2016 0922   TCO2 28 12/06/2016 0922   O2SAT 69.0 12/06/2016 0922   O2SAT 67.0 12/06/2016  0922    Vent Settings: N/A  Condition - Extremely Guarded  Family Communication  : Left voicemail for daughter on 4/30  Code  Status :  Full Code  Diet :  Diet Order            Diet heart healthy/carb modified Room service appropriate? Yes; Fluid consistency: Thin; Fluid restriction: 2000 mL Fluid  Diet effective now               Disposition Plan  :   Status is: Inpatient  Remains inpatient appropriate because:Inpatient level of care appropriate due to severity of illness  Dispo: The patient is from: Home              Anticipated d/c is to: Home              Anticipated d/c date is: > 3 days              Patient currently is not medically stable to d/c.  Barriers to discharge: Hypoxia requiring O2 supplementation/complete 5 days of IV Remdesivir, worsening AKI  Antimicorbials  :    Anti-infectives (From admission, onward)   Start     Dose/Rate Route Frequency Ordered Stop   01/15/20 1200  vancomycin (VANCOREADY) IVPB 1250 mg/250 mL  Status:  Discontinued     1,250 mg 166.7 mL/hr over 90 Minutes Intravenous Every 48 hours 01/13/20 1018 01/14/20 1038   01/14/20 1000  remdesivir 100 mg in sodium chloride 0.9 % 100 mL IVPB     100 mg 200 mL/hr over 30 Minutes Intravenous Daily 01/13/20 1129 01/18/20 0959   01/13/20 1630  valACYclovir (VALTREX) tablet 1,000 mg     1,000 mg Oral 3 times daily 01/13/20 1557     01/13/20 1230  remdesivir 200 mg in sodium chloride 0.9% 250 mL IVPB     200 mg 580 mL/hr over 30 Minutes Intravenous Once 01/13/20 1129 01/13/20 1800   01/13/20 1015  ceFEPIme (MAXIPIME) 2 g in sodium chloride 0.9 % 100 mL IVPB     2 g 200 mL/hr over 30 Minutes Intravenous  Once 01/13/20 1003 01/13/20 1434   01/13/20 1015  vancomycin (VANCOREADY) IVPB 2000 mg/400 mL     2,000 mg 200 mL/hr over 120 Minutes Intravenous  Once 01/13/20 1012 01/13/20 2258      Inpatient Medications  Scheduled Meds: . vitamin C  500 mg Oral Daily  . aspirin EC  81 mg Oral Q  breakfast  . atorvastatin  40 mg Oral Daily  . brimonidine  1 drop Both Eyes BID  . clopidogrel  75 mg Oral Daily  . colchicine  0.3 mg Oral Daily  . dorzolamide-timolol  1 drop Both Eyes BID  . doxazosin  4 mg Oral QHS  . enoxaparin (LOVENOX) injection  60 mg Subcutaneous Q24H  . famotidine  20 mg Oral Daily  . insulin aspart  0-20 Units Subcutaneous TID WC  . insulin aspart  0-5 Units Subcutaneous QHS  . insulin glargine  26 Units Subcutaneous BID  . Ipratropium-Albuterol  1 puff Inhalation Q6H  . latanoprost  1 drop Both Eyes QHS  . linagliptin  5 mg Oral Daily  . methylPREDNISolone (SOLU-MEDROL) injection  40 mg Intravenous Q12H  . prednisoLONE acetate  1 drop Right Eye BID  . sodium chloride flush  3 mL Intravenous Once  . valACYclovir  1,000 mg Oral TID  . zinc sulfate  220 mg Oral Daily   Continuous Infusions: . remdesivir 100 mg in NS 100 mL 100 mg (01/16/20 0851)  . tocilizumab (ACTEMRA) - non-COVID treatment     PRN Meds:.acetaminophen, chlorpheniramine-HYDROcodone, guaiFENesin-dextromethorphan, ondansetron **OR**  ondansetron (ZOFRAN) IV, oxyCODONE-acetaminophen   Time Spent in minutes  35  See all Orders from today for further details   Oren Binet M.D on 01/16/2020 at 10:30 AM  To page go to [email or web address] - use universal password  [facility] -  Office  [phone]    Objective:   Vitals:   01/16/20 0030 01/16/20 0320 01/16/20 0321 01/16/20 0825  BP:   128/62 (!) 135/59  Pulse:   (!) 57 (!) 57  Resp:   (!) 25 (!) 21  Temp: 98.6 F (37 C) 97.8 F (36.6 C) 97.8 F (36.6 C) 97.7 F (36.5 C)  TempSrc:  Axillary Oral Oral  SpO2:   96% 95%  Weight:    109.8 kg  Height:        Wt Readings from Last 3 Encounters:  01/16/20 109.8 kg  01/08/20 115.6 kg  07/15/19 102.9 kg     Intake/Output Summary (Last 24 hours) at 01/16/2020 1030 Last data filed at 01/16/2020 0900 Gross per 24 hour  Intake 240 ml  Output 200 ml  Net 40 ml    Physical  Exam Gen Exam:Alert awake-not in any distress HEENT:atraumatic, normocephalic Chest: B/L clear to auscultation anteriorly CVS:S1S2 regular Abdomen:soft non tender, non distended Extremities:+ edema Neurology: Non focal Skin: no rash   Data Review:    CBC Recent Labs  Lab 01/13/20 0002 01/14/20 0307 01/15/20 0528 01/16/20 0632  WBC 5.3 5.1 9.1 14.7*  HGB 9.8* 10.0* 10.1* 10.0*  HCT 30.7* 31.4* 31.2* 30.9*  PLT 110* 143* 208 273  MCV 94.8 94.6 92.0 92.2  MCH 30.2 30.1 29.8 29.9  MCHC 31.9 31.8 32.4 32.4  RDW 17.1* 17.1* 16.9* 16.9*  LYMPHSABS  --  0.5* 1.0 PENDING  MONOABS  --  0.2 0.4 PENDING  EOSABS  --  0.0 0.0 PENDING  BASOSABS  --  0.0 0.0 PENDING    Chemistries  Recent Labs  Lab 01/13/20 0002 01/14/20 0307 01/15/20 0528 01/16/20 0632  NA 135 140 140 140  K 4.2 4.5 4.7 4.7  CL 106 109 109 109  CO2 19* 19* 19* 20*  GLUCOSE 238* 196* 253* 217*  BUN 40* 44* 63* 81*  CREATININE 2.04* 2.08* 2.48* 2.96*  CALCIUM 7.9* 8.1* 8.2* 8.3*  MG  --  1.6* 2.0 2.1  AST  --  62* 57* 60*  ALT  --  34 31 32  ALKPHOS  --  38 47 59  BILITOT  --  0.8 0.6 0.9   ------------------------------------------------------------------------------------------------------------------ No results for input(s): CHOL, HDL, LDLCALC, TRIG, CHOLHDL, LDLDIRECT in the last 72 hours.  Lab Results  Component Value Date   HGBA1C 12.2 (H) 01/05/2020   ------------------------------------------------------------------------------------------------------------------ No results for input(s): TSH, T4TOTAL, T3FREE, THYROIDAB in the last 72 hours.  Invalid input(s): FREET3 ------------------------------------------------------------------------------------------------------------------ Recent Labs    01/15/20 0528 01/16/20 0632  FERRITIN 1,624* 1,544*    Coagulation profile No results for input(s): INR, PROTIME in the last 168 hours.  Recent Labs    01/15/20 0528 01/16/20 0632  DDIMER  2.30* 3.05*    Cardiac Enzymes No results for input(s): CKMB, TROPONINI, MYOGLOBIN in the last 168 hours.  Invalid input(s): CK ------------------------------------------------------------------------------------------------------------------    Component Value Date/Time   BNP 193.8 (H) 01/13/2020 0002    Micro Results Recent Results (from the past 240 hour(s))  Respiratory Panel by RT PCR (Flu A&B, Covid) - Nasopharyngeal Swab     Status: Abnormal   Collection Time: 01/13/20 10:25 AM   Specimen: Nasopharyngeal Swab  Result Value Ref Range Status   SARS Coronavirus 2 by RT PCR POSITIVE (A) NEGATIVE Final    Comment: RESULT CALLED TO, READ BACK BY AND VERIFIED WITH: Sheran Lawless RN 12:25 01/13/20 ([email or web address]) (NOTE) SARS-CoV-2 target nucleic acids are DETECTED. SARS-CoV-2 RNA is generally detectable in upper respiratory specimens  during the acute phase of infection. Positive results are indicative of the presence of the identified virus, but do not rule out bacterial infection or co-infection with other pathogens not detected by the test. Clinical correlation with patient history and other diagnostic information is necessary to determine patient infection status. The expected result is Negative. Fact Sheet for Patients:  PinkCheek.be Fact Sheet for Healthcare Providers: GravelBags.it This test is not yet approved or cleared by the [place] FDA and  has been authorized for detection and/or diagnosis of SARS-CoV-2 by FDA under an Emergency Use Authorization (EUA).  This EUA will remain in effect (meaning this test can be used)  for the duration of  the COVID-19 declaration under Section 564(b)(1) of the Act, 21 U.S.C. section 360bbb-3(b)(1), unless the authorization is terminated or revoked sooner.    Influenza A by PCR NEGATIVE NEGATIVE Final   Influenza B by PCR NEGATIVE NEGATIVE Final    Comment: (NOTE) The Xpert  Xpress SARS-CoV-2/FLU/RSV assay is intended as an aid in  the diagnosis of influenza from Nasopharyngeal swab specimens and  should not be used as a sole basis for treatment. Nasal washings and  aspirates are unacceptable for Xpert Xpress SARS-CoV-2/FLU/RSV  testing. Fact Sheet for Patients: PinkCheek.be Fact Sheet for Healthcare Providers: GravelBags.it This test is not yet approved or cleared by the [place] FDA and  has been authorized for detection and/or diagnosis of SARS-CoV-2 by  FDA under an Emergency Use Authorization (EUA). This EUA will remain  in effect (meaning this test can be used) for the duration of the  Covid-19 declaration under Section 564(b)(1) of the Act, 21  U.S.C. section 360bbb-3(b)(1), unless the authorization is  terminated or revoked. Performed at [facility], [place] [address], [place], [place] [address]   Blood culture (routine x 2)     Status: None (Preliminary result)   Collection Time: 01/13/20 11:10 AM   Specimen: BLOOD  Result Value Ref Range Status   Specimen Description BLOOD LEFT ANTECUBITAL  Final   Special Requests   Final    BOTTLES DRAWN AEROBIC AND ANAEROBIC Blood Culture results may not be optimal due to an inadequate volume of blood received in culture bottles   Culture   Final    NO GROWTH 2 DAYS Performed at [facility], [place] [address], [place], [place] [address]    Report Status PENDING  Incomplete  Blood culture (routine x 2)     Status: None (Preliminary result)   Collection Time: 01/14/20  8:51 AM   Specimen: BLOOD LEFT HAND  Result Value Ref Range Status   Specimen Description BLOOD LEFT HAND  Final   Special Requests   Final    BOTTLES DRAWN AEROBIC AND ANAEROBIC Blood Culture results may not be optimal due to an inadequate volume of blood received in culture bottles   Culture   Final    NO GROWTH < 24 HOURS Performed at [facility], [place]  [address], [place], [place] [address]    Report Status PENDING  Incomplete    Radiology Reports DG Chest 2 View  Result Date: 01/13/2020 CLINICAL DATA:  Shortness of breath and bilateral  leg swelling. EXAM: CHEST - 2 VIEW COMPARISON:  November 30, 2016 FINDINGS: Moderate severity diffuse multifocal infiltrates are seen, bilaterally. There is no evidence of a pleural effusion or pneumothorax. The cardiac silhouette is mildly enlarged. A radiopaque fusion plate and screws are seen overlying the lower cervical spine. The visualized skeletal structures are otherwise unremarkable. Radiopaque surgical clips are seen within the right upper quadrant. IMPRESSION: Moderate severity bilateral multifocal infiltrates. Electronically Signed   By: Virgina Norfolk M.D.   On: 01/13/2020 00:27   US RENAL  Result Date: 01/06/2020 CLINICAL DATA:  Chronic kidney disease EXAM: RENAL / URINARY TRACT ULTRASOUND COMPLETE COMPARISON:  None. FINDINGS: Right Kidney: Renal measurements: 9.3 x 4.3 x 4.6 cm = volume: 96 mL. Lobulated contour. No hydronephrosis. Normal echogenicity. Left Kidney: Renal measurements: 10.7 x 5.0 x 5.2 cm = volume: 145 mL. Lobulated contour without hydronephrosis. Normal echogenicity. Bladder: Decompressed. Other: Visualization is generally limited by body habitus. IMPRESSION: No hydronephrosis. Lobulated renal contours without focal parenchymal abnormality. Electronically Signed   By: Ulyses Jarred M.D.   On: 01/06/2020 16:16   PERIPHERAL VASCULAR CATHETERIZATION  Result Date: 01/08/2020 Patient name: [name]   MRN: [id number]        DOB: 1956/06/20            Sex: female  01/08/2020 Pre-operative Diagnosis: Critical limb ischemia of the left lower extremity with tissue loss Post-operative diagnosis:  Same Surgeon:  Marty Heck, MD Procedure Performed: 1.  Ultrasound guided access of the right common femoral artery 2.  Aortogram with CO2 including catheter selection of the aorta 3.  Left lower  extremity arteriogram with selection of third order branches 4.  Left distal SFA above-knee popliteal artery angioplasty with stent placement (primarily stented with a 6 mm x 60 mm drug-coated Eluvia postdilated with a 5 mm Mustang) 5.  Left anterior tibial angioplasty for focal perforation with resolution of the perforation (3 mm Sterling) 6.  Mynx closure of the right common femoral artery 7.  55 minutes of monitored moderate conscious sedation time   Indications: Patient is a 64 year old female who was seen in consultation by Dr. Trula Slade for left lower extremity rest pain with severely depressed ABIs and duplex showing likely popliteal disease.  She presents today for left lower extremity arteriogram after risk benefits discussed.  Findings:  Aortogram was performed with CO2 to limit contrast and there was no flow-limiting aortoiliac stenosis.  Left lower extremity arteriogram showed a patent common femoral profunda and widely patent SFA in the proximal to mid segment.  At [facility] canal in the distal SFA and above-knee popliteal artery showed a very short focal occlusion with large collateral around this.  She had a patent below-knee popliteal artery with three-vessel runoff.  Anterior tibial appeared to be dominant runoff.   Ultimately this lesion was crossed in the distal SFA/above knee popliteal artery and primarily stented with a 6 mm x 60 mm Eluvia.  She now has excellent inline flow down the left lower extremity with no residual stenosis and three-vessel runoff.  There was a focal perforation in the anterior tibial where our wire likely made a perforation during exchange of balloons and stents.  Ultimately this was treated with a very low inflation of a 3 mm Sterling for 3 minutes with resolution.  Patient has preserved three vessel runoff.              Procedure:  The patient was identified in the holding area and taken to room  8.  The patient was then placed supine on the table and prepped and  draped in the usual sterile fashion.  A time out was called.  Ultrasound was used to evaluate the right common femoral artery.  It was patent .  A digital ultrasound image was acquired.  A micropuncture needle was used to access the right common femoral artery under ultrasound guidance.  An 018 wire was advanced without resistance and a micropuncture sheath was placed.  The 018 wire was removed and a benson wire was placed.  The micropuncture sheath was exchanged for a 5 french sheath.  An omniflush catheter was advanced over the wire to the level of L-1.  An abdominal angiogram was obtained.  Next, using glidewire and straight 4 French flush catheter the aortic bifurcation was cross because the Omni would not track.  Placed the catheter into theleft external iliac artery and left runoff was obtained.  Ultimately after evaluating the pictures elected to intervene on the distal left SFA above-knee popliteal occlusion.  Used a long Glidewire advantage down the SFA and exchanged for a 6 [place] Ansell sheath in the right groin over the aortic bifurcation.  Patient was given 100 units per kilogram heparin.  I then primarily stented this lesion with a 6 mm x 60 mm Eluvia that was postdilated with a 5 mm x 60 mm Mustang.  Final pictures showed no residual stenosis with excellent inline flow down the left lower extremity and three-vessel runoff.  There was a focal perforation in the proximal anterior tibial that I think was from our wire.  I then exchanged for a V 18 wire down the anterior tibial and then did a very slow inflation with a 3 mm Sterling in the left anterior tibial artery for 3 minutes.  There appeared to be resolution of the perforation after taking multiple additional pictures.  Wires and catheters removed and a short 6 French sheath was placed in the right groin.  We placed a mynx closure device.   Plan: Patient will be loaded on Plavix and will need to continue aspirin and Plavix.  Should have inline  flow down left leg now.   Marty Heck, MD [facility] Office: [phone]   DG Chest Port 1V same Day  Result Date: 01/16/2020 CLINICAL DATA:  Shortness of breath.  COVID. EXAM: PORTABLE CHEST 1 VIEW COMPARISON:  Three days ago FINDINGS: More confluent bilateral interstitial and airspace opacity. Cardiomegaly. No visible effusion or pneumothorax. IMPRESSION: Worsening bilateral pneumonia. Electronically Signed   By: Monte Fantasia M.D.   On: 01/16/2020 08:34   DG Foot Complete Left  Result Date: 01/05/2020 CLINICAL DATA:  Swelling EXAM: LEFT FOOT - COMPLETE 3+ VIEW COMPARISON:  None. FINDINGS: Degenerative changes in the 1st MTP joint. No acute bony abnormality. Specifically, no fracture, subluxation, or dislocation. Vascular calcifications noted. No bone destruction seen to suggest osteomyelitis. IMPRESSION: No acute bony abnormality. Electronically Signed   By: Rolm Baptise M.D.   On: 01/05/2020 01:25   VAS [place] ABI WITH/WO TBI  Result Date: 01/05/2020 LOWER EXTREMITY DOPPLER STUDY Indications: Blue, cold toes. High Risk Factors: Hypertension, Diabetes.  Limitations: Today's exam was limited due to Patient body habitus. Comparison Study: No prior studies. Performing Technologist: Carlos Levering Rvt  Examination Guidelines: A complete evaluation includes at minimum, Doppler waveform signals and systolic blood pressure reading at the level of bilateral brachial, anterior tibial, and posterior tibial arteries, when vessel segments are accessible. Bilateral testing is  considered an integral part of a complete examination. Photoelectric Plethysmograph (PPG) waveforms and toe systolic pressure readings are included as required and additional duplex testing as needed. Limited examinations for reoccurring indications may be performed as noted.  ABI Findings: +---------+------------------+-----+---------+--------+ Right    Rt Pressure (mmHg)IndexWaveform Comment   +---------+------------------+-----+---------+--------+ Brachial 150                    triphasic         +---------+------------------+-----+---------+--------+ PTA      108               0.72 biphasic          +---------+------------------+-----+---------+--------+ DP       137               0.91 triphasic         +---------+------------------+-----+---------+--------+ Great Toe76                0.51                   +---------+------------------+-----+---------+--------+ +---------+------------------+-----+----------+-------+ Left     Lt Pressure (mmHg)IndexWaveform  Comment +---------+------------------+-----+----------+-------+ Brachial 136                    triphasic         +---------+------------------+-----+----------+-------+ PTA      66                0.44 monophasic        +---------+------------------+-----+----------+-------+ DP       53                0.35 monophasic        +---------+------------------+-----+----------+-------+ Great Toe0                 0.00                   +---------+------------------+-----+----------+-------+ +-------+-----------+-----------+------------+------------+ ABI/TBIToday's ABIToday's TBIPrevious ABIPrevious TBI +-------+-----------+-----------+------------+------------+ Right  0.91       0.51                                +-------+-----------+-----------+------------+------------+ Left   0.44       0                                   +-------+-----------+-----------+------------+------------+  Summary: Right: Resting right ankle-brachial index indicates mild right lower extremity arterial disease. The right toe-brachial index is abnormal. Left: Resting left ankle-brachial index indicates severe left lower extremity arterial disease. The left toe-brachial index is abnormal.  *See table(s) above for measurements and observations.  Electronically signed by [name] MD on 01/05/2020 at 7:35:40  PM.    Final    VAS [place] LOWER EXTREMITY ARTERIAL DUPLEX  Result Date: 01/05/2020 LOWER EXTREMITY ARTERIAL DUPLEX STUDY Indications: Blue, cold toes. High Risk Factors: Hypertension, Diabetes.  Current ABI: R 0.91 L 0.44 Limitations: Patient body habitus, poor ultrasound/tissue interface. Comparison Study: No prior studies. Performing Technologist: Oliver Hum RVT  Examination Guidelines: A complete evaluation includes B-mode imaging, spectral Doppler, color Doppler, and power Doppler as needed of all accessible portions of each vessel. Bilateral testing is considered an integral part of a complete examination. Limited examinations for reoccurring indications may be performed as noted.  +-----------+--------+-----+--------+----------+------------------+ LEFT       PSV cm/sRatioStenosisWaveform  Comments           +-----------+--------+-----+--------+----------+------------------+  EIA Distal 116                  biphasic                     +-----------+--------+-----+--------+----------+------------------+ CFA Mid    132                  biphasic                     +-----------+--------+-----+--------+----------+------------------+ DFA        111                  biphasic                     +-----------+--------+-----+--------+----------+------------------+ SFA Prox   104                  biphasic                     +-----------+--------+-----+--------+----------+------------------+ SFA Mid    85                   biphasic                     +-----------+--------+-----+--------+----------+------------------+ SFA Distal 91                   biphasic                     +-----------+--------+-----+--------+----------+------------------+ POP Prox   26                   monophasic                   +-----------+--------+-----+--------+----------+------------------+ POP Distal 0                              Unable to insonate  +-----------+--------+-----+--------+----------+------------------+ ATA Prox   73                   biphasic                     +-----------+--------+-----+--------+----------+------------------+ ATA Mid    33                   monophasic                   +-----------+--------+-----+--------+----------+------------------+ ATA Distal 32                   monophasic                   +-----------+--------+-----+--------+----------+------------------+ PTA Prox                                  Unable to insonate +-----------+--------+-----+--------+----------+------------------+ PTA Mid    27                   monophasic                   +-----------+--------+-----+--------+----------+------------------+ PTA Distal 9                    monophasic                   +-----------+--------+-----+--------+----------+------------------+  PERO Prox                                 Unable to insonate +-----------+--------+-----+--------+----------+------------------+ PERO Mid                                  Unable to insonate +-----------+--------+-----+--------+----------+------------------+ PERO Distal15                   monophasic                   +-----------+--------+-----+--------+----------+------------------+ DP         29                   monophasic                   +-----------+--------+-----+--------+----------+------------------+  Summary: Left: The external iliac, common femoral, and superficial femoral arteries appear patent with no significant stenosis. Monophasic waveforms are noted in the proximal popliteal artery with poor visualization of the distal segment. Which suggests a possible occlusion. Anterior tibial artery appears patent throughout with monophasic flow. The posterior tibial, and peroneal arteries demonstrated monophasic flow with likely areas of occlusive disease. The proximal calf arteries were difficult to visualize.  See  table(s) above for measurements and observations. Electronically signed by [name] MD on 01/05/2020 at 37:34:41 PM.    Final    VAS [place] LOWER EXTREMITY VENOUS (DVT)  Result Date: 01/13/2020  Lower Venous DVTStudy Indications: Swelling.  Limitations: Body habitus and poor ultrasound/tissue interface. Performing Technologist: Antonieta Pert RDMS, RVT  Examination Guidelines: A complete evaluation includes B-mode imaging, spectral Doppler, color Doppler, and power Doppler as needed of all accessible portions of each vessel. Bilateral testing is considered an integral part of a complete examination. Limited examinations for reoccurring indications may be performed as noted. The reflux portion of the exam is performed with the patient in reverse Trendelenburg.  +---------+---------------+---------+-----------+----------+-------------------+ RIGHT    CompressibilityPhasicitySpontaneityPropertiesThrombus Aging      +---------+---------------+---------+-----------+----------+-------------------+ CFV      Partial        Yes      Yes                  patient body                                                              habitus and                                                               position limiting                                                         evaluation          +---------+---------------+---------+-----------+----------+-------------------+  SFJ      Full                                                             +---------+---------------+---------+-----------+----------+-------------------+ FV Prox                          Yes                                      +---------+---------------+---------+-----------+----------+-------------------+ FV Mid                           Yes                                      +---------+---------------+---------+-----------+----------+-------------------+ FV Distal                         Yes                                      +---------+---------------+---------+-----------+----------+-------------------+ PFV      Full                                                             +---------+---------------+---------+-----------+----------+-------------------+ POP      Full                                                             +---------+---------------+---------+-----------+----------+-------------------+ PTV                                                   not clearly                                                               visualized          +---------+---------------+---------+-----------+----------+-------------------+ PERO                                                  not clearly  visualized          +---------+---------------+---------+-----------+----------+-------------------+   +---------+---------------+---------+-----------+----------+------------------+ LEFT     CompressibilityPhasicitySpontaneityPropertiesThrombus Aging     +---------+---------------+---------+-----------+----------+------------------+ CFV      Full           Yes      Yes                                     +---------+---------------+---------+-----------+----------+------------------+ SFJ      Full                                                            +---------+---------------+---------+-----------+----------+------------------+ FV Prox  Full                                                            +---------+---------------+---------+-----------+----------+------------------+ FV Mid   Full                                                            +---------+---------------+---------+-----------+----------+------------------+ FV Distal                        Yes                                      +---------+---------------+---------+-----------+----------+------------------+ PFV      Full                                                            +---------+---------------+---------+-----------+----------+------------------+ POP      Full           Yes      Yes                                     +---------+---------------+---------+-----------+----------+------------------+ PTV                                                   not clearly                                                              visualized         +---------+---------------+---------+-----------+----------+------------------+ PERO  not clearly                                                              visualized         +---------+---------------+---------+-----------+----------+------------------+    Summary: RIGHT: - There is no evidence of deep vein thrombosis in the lower extremity. However, portions of this examination were limited- see technologist comments above.  LEFT: - There is no evidence of deep vein thrombosis in the lower extremity. However, portions of this examination were limited- see technologist comments above.  - Prominent lymph nodes noted bilateral groin areas.  *See table(s) above for measurements and observations. Electronically signed by Deitra Mayo MD on 01/13/2020 at 6:50:27 PM.    Final    ECHOCARDIOGRAM LIMITED  Result Date: 01/15/2020    ECHOCARDIOGRAM LIMITED REPORT   Patient Name:   [name] Date of Exam: 01/15/2020 Medical Rec #:  [id number]     Height:       61.0 in Accession #:    [id number]    Weight:       249.3 lb Date of Birth:  April 19, 1956      BSA:          2.074 m Patient Age:    4 years      BP:           131/70 mmHg Patient Gender: F             HR:           53 bpm. Exam Location:  Inpatient Procedure: Limited Echo, Limited Color Doppler and Cardiac Doppler Indications:    Acute Respiratory  Insufficiency R06.89  History:        Patient has prior history of Echocardiogram examinations, most                 recent 12/02/2016. Risk Factors:Hypertension and Dyslipidemia.  Sonographer:    Mikki Santee RDCS (AE) Referring Phys: [place]  1. Left ventricular diastolic parameters are consistent with Grade II diastolic dysfunction (pseudonormalization).  2. Right ventricular systolic function is moderately reduced. There is severely elevated pulmonary artery systolic pressure.  3. The inferior vena cava is normal in size with greater than 50% respiratory variability, suggesting right atrial pressure of 3 mmHg. FINDINGS  Left Ventricle: Indeterminate filling pressures. Right Ventricle: Right ventricular systolic function is moderately reduced. There is severely elevated pulmonary artery systolic pressure. The tricuspid regurgitant velocity is 3.86 m/s, and with an assumed right atrial pressure of 3 mmHg, the estimated right ventricular systolic pressure is [id number] mmHg. Tricuspid Valve: Tricuspid valve regurgitation is mild. Venous: The inferior vena cava is normal in size with greater than 50% respiratory variability, suggesting right atrial pressure of 3 mmHg.  LEFT VENTRICLE PLAX 2D LVOT diam:     2.10 cm  Diastology LV SV:         81       LV e' lateral:   5.61 cm/s LV SV Index:   39       LV E/e' lateral: 12.1 LVOT Area:     3.46 cm LV e' medial:    6.40 cm/s  LV E/e' medial:  10.6  RIGHT VENTRICLE TAPSE (M-mode): 1.5 cm LEFT ATRIUM           Index       RIGHT ATRIUM           Index LA Vol (A4C): 37.4 ml 18.03 ml/m RA Area:     16.80 cm                                   RA Volume:   44.50 ml  21.45 ml/m  AORTIC VALVE LVOT Vmax:   98.60 cm/s LVOT Vmean:  69.800 cm/s LVOT VTI:    0.235 m MITRAL VALVE               TRICUSPID VALVE MV Area (PHT): 3.53 cm    TR Peak grad:   59.6 mmHg MV Decel Time: 215 msec    TR Vmax:        386.00 cm/s MV E velocity: 67.70  cm/s MV A velocity: 65.10 cm/s  SHUNTS MV E/A ratio:  1.04        Systemic VTI:  0.24 m                            Systemic Diam: 2.10 cm Skeet Latch MD Electronically signed by Skeet Latch MD Signature Date/Time: 01/15/2020/3:50:03 PM    Final

## 2020-01-16 NOTE — Progress Notes (Signed)
ANTICOAGULATION CONSULT NOTE - Initial Consult  Pharmacy Consult for IV hepairn  Indication: empiric VTE  Allergies  Allergen Reactions  . Pork Allergy   . Pork-Derived Products Nausea And Vomiting    Patient Measurements: Height: 5\' 1"  (154.9 cm) Weight: 109.8 kg (242 lb 1 oz) IBW/kg (Calculated) : 47.8 Heparin Dosing Weight:  75 kg  Vital Signs: Temp: 97.7 F (36.5 C) (04/30 0825) Temp Source: Oral (04/30 0825) BP: 135/59 (04/30 0825) Pulse Rate: 57 (04/30 0825)  Labs: Recent Labs    01/14/20 0307 01/14/20 0307 01/15/20 0528 01/16/20 0632  HGB 10.0*   < > 10.1* 10.0*  HCT 31.4*  --  31.2* 30.9*  PLT 143*  --  208 273  CREATININE 2.08*  --  2.48* 2.96*   < > = values in this interval not displayed.    Estimated Creatinine Clearance: 22.3 mL/min (A) (by C-G formula based on SCr of 2.96 mg/dL (H)).   Medical History: Past Medical History:  Diagnosis Date  . Achilles rupture   . Anxiety   . Arthritis   . Asthma   . Chronic back pain   . Chronic diastolic heart failure ([place]) 11/30/2016  . Chronic headaches   . Chronic leg pain    bilaterally  . CHRONIC OBSTRUCTIVE PULMONARY DISEASE, MODERATE 03/26/2010   Annotation: with moderate restrictive lung disease as well per PFTs  04/02/07--actual numbers not noted in old records Qualifier: Diagnosis of  By: Amil Amen MD, Benjamine Mola    . Depression   . Diabetes mellitus 1999   T2DM  . Glaucoma   . Gout   . H/O gestational diabetes mellitus, not currently pregnant   . Hyperlipidemia   . Hypertension   . Morbid obesity ([place])   . Obstructive sleep apnea on CPAP 07/02/2009   Qualifier: Diagnosis of  By: Amil Amen MD, Benjamine Mola    . OSA (obstructive sleep apnea)   . Sciatica      Assessment: 64 y.o female admitted to [facility] on 4/27 for hypoxia from Covid/heart failure. She has been receiving VTE prophylaxis Lovenox 0.5 mg/kg sq q24h , dosing for BMI of 45 , as CrCl had been > 30 ml/min.  Scr has trended upward and  estimated CrCl is 22.3 ml/min.  Today pharmacy consulted  to change anticoagulation to IV heparin infusion for empiric VTE due to worsening hypoxia and new RV dysfuntion on echo. MD has order VQ scan.  Last received lovenox 60mg  sq on 4/29 @ 13:09.  PLTC improved to 208k , Hgb 10.1 low stable on 4/29.  No bleeding reported.     Goal of Therapy:  Heparin level 0.3-0.5 units/hr  (lower end of therapeutic goal per Dr. Sloan Leiter Monitor platelets by anticoagulation protocol: Yes   Plan:  Discontinue Lovenox Give Heparin 2500 units IV bolus x1 (1/2 usual bolus) Start Heparin drip at 1200 units/hr Check heparin level in ~ 6 hours Daily HL, CBC Follow up VQ scan result  Thank you for allowing pharmacy to be part of this patients care team. Nicole Cella, [place] Pharmacist [phone] Please check AMION for all [place] phone numbers After 10:00 PM, call [place] [phone] 01/16/2020,10:59 AM

## 2020-01-16 NOTE — Progress Notes (Signed)
Patient was on 8L O2 nasal canula at beginning of shift vital signs stable. Patient's O2 saturating at 98%.  O2 was turned down to 6L [place] patient was able to sleep and being monitored.   While this nurse was on break at 0400  It was reported that patient desaturated to 45%, Patient was placed on HFNC at 10L to keep O2 up.  At 0430 when this nurse came back from break patient was on 10L HFNC saturating at 98%, patient is alert and oriented with no sign of distress,.  CN was notified and this nurse called the RT on phone to notify the current respiratory status of the patient.

## 2020-01-16 NOTE — Plan of Care (Signed)
  Problem: Clinical Measurements: Goal: Diagnostic test results will improve Outcome: Progressing Goal: Respiratory complications will improve Outcome: Progressing   Problem: Activity: Goal: Risk for activity intolerance will decrease Outcome: Progressing   

## 2020-01-17 LAB — CBC WITH DIFFERENTIAL/PLATELET
Abs Immature Granulocytes: 0.31 10*3/uL — ABNORMAL HIGH (ref 0.00–0.07)
Basophils Absolute: 0 10*3/uL (ref 0.0–0.1)
Basophils Relative: 0 %
Eosinophils Absolute: 0 10*3/uL (ref 0.0–0.5)
Eosinophils Relative: 0 %
HCT: 32.7 % — ABNORMAL LOW (ref 36.0–46.0)
Hemoglobin: 10.8 g/dL — ABNORMAL LOW (ref 12.0–15.0)
Immature Granulocytes: 2 %
Lymphocytes Relative: 9 %
Lymphs Abs: 1.4 10*3/uL (ref 0.7–4.0)
MCH: 29.8 pg (ref 26.0–34.0)
MCHC: 33 g/dL (ref 30.0–36.0)
MCV: 90.1 fL (ref 80.0–100.0)
Monocytes Absolute: 1.1 10*3/uL — ABNORMAL HIGH (ref 0.1–1.0)
Monocytes Relative: 7 %
Neutro Abs: 13.9 10*3/uL — ABNORMAL HIGH (ref 1.7–7.7)
Neutrophils Relative %: 82 %
Platelets: 323 10*3/uL (ref 150–400)
RBC: 3.63 MIL/uL — ABNORMAL LOW (ref 3.87–5.11)
RDW: 16.7 % — ABNORMAL HIGH (ref 11.5–15.5)
WBC: 16.8 10*3/uL — ABNORMAL HIGH (ref 4.0–10.5)
nRBC: 0.4 % — ABNORMAL HIGH (ref 0.0–0.2)

## 2020-01-17 LAB — COMPREHENSIVE METABOLIC PANEL
ALT: 43 U/L (ref 0–44)
AST: 69 U/L — ABNORMAL HIGH (ref 15–41)
Albumin: 2.5 g/dL — ABNORMAL LOW (ref 3.5–5.0)
Alkaline Phosphatase: 71 U/L (ref 38–126)
Anion gap: 12 (ref 5–15)
BUN: 94 mg/dL — ABNORMAL HIGH (ref 8–23)
CO2: 19 mmol/L — ABNORMAL LOW (ref 22–32)
Calcium: 8.6 mg/dL — ABNORMAL LOW (ref 8.9–10.3)
Chloride: 109 mmol/L (ref 98–111)
Creatinine, Ser: 3.11 mg/dL — ABNORMAL HIGH (ref 0.44–1.00)
GFR calc Af Amer: 18 mL/min — ABNORMAL LOW (ref >60)
GFR calc non Af Amer: 15 mL/min — ABNORMAL LOW (ref >60)
Glucose, Bld: 159 mg/dL — ABNORMAL HIGH (ref 70–99)
Potassium: 4.5 mmol/L (ref 3.5–5.1)
Sodium: 140 mmol/L (ref 135–145)
Total Bilirubin: 0.9 mg/dL (ref 0.3–1.2)
Total Protein: 6 g/dL — ABNORMAL LOW (ref 6.5–8.1)

## 2020-01-17 LAB — GLUCOSE, CAPILLARY
Glucose-Capillary: 142 mg/dL — ABNORMAL HIGH (ref 70–99)
Glucose-Capillary: 157 mg/dL — ABNORMAL HIGH (ref 70–99)
Glucose-Capillary: 134 mg/dL — ABNORMAL HIGH (ref 70–99)
Glucose-Capillary: 134 mg/dL — ABNORMAL HIGH (ref 70–99)
Glucose-Capillary: 157 mg/dL — ABNORMAL HIGH (ref 70–99)

## 2020-01-17 LAB — C-REACTIVE PROTEIN: CRP: 7 mg/dL — ABNORMAL HIGH (ref <1.0)

## 2020-01-17 LAB — URINALYSIS, COMPLETE (UACMP) WITH MICROSCOPIC
Bacteria, UA: NONE SEEN
Bilirubin Urine: NEGATIVE
Glucose, UA: NEGATIVE mg/dL
Hgb urine dipstick: NEGATIVE
Ketones, ur: NEGATIVE mg/dL
Leukocytes,Ua: NEGATIVE
Nitrite: NEGATIVE
Protein, ur: NEGATIVE mg/dL
Specific Gravity, Urine: 1.023 (ref 1.005–1.030)
pH: 5 (ref 5.0–8.0)

## 2020-01-17 LAB — PROTEIN / CREATININE RATIO, URINE
Creatinine, Urine: 173.01 mg/dL
Protein Creatinine Ratio: 0.16 mg/mg{Cre} — ABNORMAL HIGH (ref 0.00–0.15)
Total Protein, Urine: 27 mg/dL

## 2020-01-17 LAB — BRAIN NATRIURETIC PEPTIDE: B Natriuretic Peptide: 134.9 pg/mL — ABNORMAL HIGH (ref 0.0–100.0)

## 2020-01-17 LAB — FERRITIN: Ferritin: 1538 ng/mL — ABNORMAL HIGH (ref 11–307)

## 2020-01-17 LAB — MAGNESIUM: Magnesium: 2.4 mg/dL (ref 1.7–2.4)

## 2020-01-17 LAB — D-DIMER, QUANTITATIVE: D-Dimer, Quant: 3.71 ug/mL-FEU — ABNORMAL HIGH (ref 0.00–0.50)

## 2020-01-17 MED ORDER — SODIUM BICARBONATE 650 MG PO TABS
650.0000 mg | ORAL_TABLET | Freq: Two times a day (BID) | ORAL | Status: DC
  Administered 2020-01-17 – 2020-01-18 (×3): 650 mg via ORAL
  Filled 2020-01-17 (×3): qty 1

## 2020-01-17 NOTE — Progress Notes (Signed)
MEWS score changed to yellow. RN reviewed data. No acute change noted. Will continue to monitor.

## 2020-01-17 NOTE — Progress Notes (Signed)
[facility] Progress Note  Name: [name] MRN: [id number] DOB: 1956-03-07  Chief Complaint:  Shortness of breath   Subjective:  Feels a little better today.   had lasix 80 mg IV once ordered on 4/30 but not charted as given.  Has 200 mL UOP over 4/30 charted.  She states she isn't having trouble urinating.  She is worried about her kidneys.  Review of systems:  Reports shortness of breath but is a little better today - on 8 liters  No n/v Denies any subjective confusion  Denies cp -------------- Background on consult:  [name] is a 64 y.o. female with a history of CKD, hypertension and chronic diastolic CHF who presented to the hospital with shortness of breath and fluid overload.  She was found to have Covid pneumonia.  Note that she did have a recent admission with critical limb ischemia of the left lower extremity and received arteriogram and angioplasty with stent placement at that time.  She follows with Dr. Marval Regal for her CKD.  Note Cr 1.55 in 11/2019 at [facility] per last admission's consult note.  Her Cr was 2.48 on 4/20 and trended down after that prior to discharge.  She was on lasix 80 mg IV BID - none on 4/29 or today thus far.   She has received colchicine daily.  Got vanc last on 4/27.  She has not been on lisinopril at 5 mg daily at home - she was afraid to start back and it has been held here.  Not altered but she is able to supplement history easier than she can provide it due to resp status.  Her nurse has had her today and yesterday and breathing seems about the same per nurse.   Intake/Output Summary (Last 24 hours) at 01/17/2020 1105 Last data filed at 01/17/2020 0900 Gross per 24 hour  Intake 1254.45 ml  Output --  Net 1254.45 ml    Vitals:  Vitals:   01/16/20 1943 01/16/20 2340 01/17/20 0347 01/17/20 0738  BP: (!) 142/60 (!) 135/51 (!) 146/66 (!) 130/54  Pulse: 61 75 77 65  Resp: 17 17 (!) 21 (!) 22  Temp: 97.7 F (36.5 C) 97.8  F (36.6 C) 98 F (36.7 C) 97.6 F (36.4 C)  TempSrc: Oral Oral Oral Oral  SpO2: 97% 92% 92% (!) 77%  Weight:      Height:         Physical Exam:  General adult female in bed  HEENT normocephalic atraumatic extraocular movements intact sclera anicteric Neck supple trachea midline Lungs decreased breath sounds; on 8 liters oxygen; easier to speak from a resp standpoint today Heart S1S2 no rub Abdomen soft nontender nondistended Extremities trace edema  Psych normal mood and affect Neuro alert and oriented x 3; more easily conversant today  Medications reviewed   Labs:  BMP Latest Ref Rng & Units 01/17/2020 01/16/2020 01/15/2020  Glucose 70 - 99 mg/dL 159(H) 217(H) 253(H)  BUN 8 - 23 mg/dL 94(H) 81(H) 63(H)  Creatinine 0.44 - 1.00 mg/dL 3.11(H) 2.96(H) 2.48(H)  BUN/Creat Ratio 12 - 28 - - -  Sodium 135 - 145 mmol/L 140 140 140  Potassium 3.5 - 5.1 mmol/L 4.5 4.7 4.7  Chloride 98 - 111 mmol/L 109 109 109  CO2 22 - 32 mmol/L 19(L) 20(L) 19(L)  Calcium 8.9 - 10.3 mg/dL 8.6(L) 8.3(L) 8.2(L)     Assessment/Plan:   # AKI - Multifactorial prerenal insults.  Hx diuretic administration to optimize resp status.  Recent history of AKI with critical limb ischemia and contrast last admission.  - hopeful for plateau of creatinine  - lasix conservatively as needed for respiratory status - hold lasix today  - Would avoid colchicine and judicious use of contrast - UA and up/cr ratio - ordered  - Check post void residual bladder scan and place foley if retains over 250 mL urine  - what is indication for valtrex to guide dose adjustments?  Will check with primary team  # CKD stage III - Noted that her cr was 1.55 in 11/2019 at [facility] per consult note last admission  -She follows with Dr. Marval Regal at [facility]  # Covid PNA -Therapies per primary team.  A/sp actemra and on remdesivir and steroids  # Acute hypoxic resp failure - covid and CHF contributing  - volume  management as above  # Chronic diastolic CHF - diuretics as above - weight trends for this admission and last: 102.5kg on 4/18, 115.6kg on 4/22 then 113.1 kg on 4/28 and 109.8 kg on 4/30.  Improving this admission still above her presentation last admission  # Normocytic anemia  - No acute indication for packed red blood cells or ESA   Claudia Desanctis, MD 01/17/2020 11:39 AM

## 2020-01-17 NOTE — NC FL2 (Signed)
[place] LEVEL OF CARE SCREENING TOOL     IDENTIFICATION  Patient Name: [name] Birthdate: 05-25-1956 Sex: female Admission Date (Current Location): 12/19/2019  [facility] and [place] Number:  Herbalist and Address:  [facility], [place] [address], [place],  [address]      Provider Number: [id number]  Attending Physician Name and Address:  Jonetta Osgood, MD  Relative Name and Phone Number:  Juluis Rainier (daughter) [phone]    Current Level of Care: Hospital Recommended Level of Care: [place] Prior Approval Number:    Date Approved/Denied:   PASRR Number: [id number]  Discharge Plan: SNF    Current Diagnoses: Patient Active Problem List   Diagnosis Date Noted  . Pneumonia due to COVID-19 virus 01/13/2020  . Acute respiratory failure ([place]) 01/13/2020  . Elevated troponin 01/13/2020  . PVD (peripheral vascular disease) ([place]) 01/13/2020  . Spinal stenosis of lumbar region 01/05/2020  . Acute foot pain, left 01/05/2020  . Lower limb ischemia 01/05/2020  . Visit for routine gyn exam 07/15/2019  . STD exposure 07/15/2019  . Uterine fibroid 07/15/2019  . Hypertensive heart and renal disease   . Acute on chronic diastolic CHF (congestive heart failure) ([place]) 11/30/2016  . CKD (chronic kidney disease) stage 3, GFR 30-59 ml/min 11/30/2016  . Thrombocytopenia ([place]) 11/30/2016  . Mixed incontinence 04/14/2015  . Asthma in adult without complication [id number]  . Primary open angle glaucoma of both eyes, severe stage 10/10/2014  . Arthritis of knee 01/05/2012  . PMB (postmenopausal bleeding) 05/09/2010  . ANEMIA 05/05/2010  . DEPRESSION 04/26/2010  . TMJ SYNDROME 04/26/2010  . DEGENERATIVE DISC DISEASE, CERVICAL SPINE 04/26/2010  . CHRONIC OBSTRUCTIVE PULMONARY DISEASE, MODERATE 03/26/2010  . Chronic obstructive pulmonary disease, unspecified ([place]) 03/26/2010  . MASTALGIA 10/08/2009  . Iron  deficiency anemia 07/12/2009  . Uncontrolled type 2 diabetes mellitus with stage 3 chronic kidney disease, with long-term current use of insulin ([place]) 07/02/2009  . Hyperlipidemia 07/02/2009  . BMI 45.0-49.9, adult ([place]) 07/02/2009  . Obstructive sleep apnea on CPAP 07/02/2009  . Glaucoma 07/02/2009  . Essential hypertension 07/02/2009  . ASTHMA 07/02/2009  . LOW BACK PAIN SYNDROME 07/02/2009  . BACK PAIN 07/02/2009  . PERIPHERAL EDEMA 07/02/2009  . TOBACCO USE, QUIT 07/02/2009  . HEMORRHOIDS, INTERNAL 07/16/2008  . DIVERTICULOSIS OF COLON 07/16/2008    Orientation RESPIRATION BLADDER Height & Weight     Self, Time, Situation, Place  O2(6L nasal cannula) Incontinent, External catheter Weight: 242 lb 1 oz (109.8 kg) Height:  5\' 1"  (154.9 cm)  BEHAVIORAL SYMPTOMS/MOOD NEUROLOGICAL BOWEL NUTRITION STATUS      Continent Diet(see discharge summary)  AMBULATORY STATUS COMMUNICATION OF NEEDS Skin   Extensive Assist Verbally Normal                       Personal Care Assistance Level of Assistance  Bathing, Feeding, Dressing, Total care Bathing Assistance: Maximum assistance Feeding assistance: Limited assistance Dressing Assistance: Maximum assistance Total Care Assistance: Maximum assistance   Functional Limitations Info  Sight, Hearing, Speech Sight Info: Adequate Hearing Info: Adequate Speech Info: Adequate    SPECIAL CARE FACTORS FREQUENCY  PT (By licensed PT), OT (By licensed OT)     PT Frequency: min 5x weekly OT Frequency: min 5x weekly            Contractures Contractures Info: Not present    Additional Factors Info  Code Status, Allergies, Isolation Precautions Code  Status Info: full Allergies Info: Pork Allergy, pork-derived products     Isolation Precautions Info: COVID 19     Current Medications (01/17/2020):  This is the current hospital active medication list Current Facility-Administered Medications  Medication Dose Route Frequency Provider  Last Rate Last Admin  . acetaminophen (TYLENOL) tablet 650 mg  650 mg Oral Q6H PRN Fuller Plan A, MD      . ascorbic acid (VITAMIN C) tablet 500 mg  500 mg Oral Daily Tamala Julian, Rondell A, MD   500 mg at 01/17/20 1032  . aspirin EC tablet 81 mg  81 mg Oral Q breakfast Tamala Julian, Rondell A, MD   81 mg at 01/17/20 1030  . atorvastatin (LIPITOR) tablet 40 mg  40 mg Oral Daily Smith, Rondell A, MD   40 mg at 01/17/20 1032  . brimonidine (ALPHAGAN) 0.2 % ophthalmic solution 1 drop  1 drop Both Eyes BID Fuller Plan A, MD   1 drop at 01/17/20 1044  . chlorpheniramine-HYDROcodone (TUSSIONEX) 10-8 MG/5ML suspension 5 mL  5 mL Oral Q12H PRN Smith, Rondell A, MD      . clopidogrel (PLAVIX) tablet 75 mg  75 mg Oral Daily Smith, Rondell A, MD   75 mg at 01/17/20 1030  . dorzolamide-timolol (COSOPT) 22.3-6.8 MG/ML ophthalmic solution 1 drop  1 drop Both Eyes BID Fuller Plan A, MD   1 drop at 01/17/20 1044  . doxazosin (CARDURA) tablet 4 mg  4 mg Oral QHS Opyd, Ilene Qua, MD   4 mg at 01/16/20 2131  . guaiFENesin-dextromethorphan (ROBITUSSIN DM) 100-10 MG/5ML syrup 10 mL  10 mL Oral Q4H PRN Fuller Plan A, MD      . heparin injection 5,000 Units  5,000 Units Subcutaneous Q8H Jonetta Osgood, MD   5,000 Units at 01/17/20 0615  . insulin aspart (novoLOG) injection 0-20 Units  0-20 Units Subcutaneous TID WC Jonetta Osgood, MD   3 Units at 01/17/20 1231  . insulin aspart (novoLOG) injection 0-5 Units  0-5 Units Subcutaneous QHS Jonetta Osgood, MD   3 Units at 01/14/20 2208  . insulin glargine (LANTUS) injection 26 Units  26 Units Subcutaneous BID Jonetta Osgood, MD   26 Units at 01/17/20 1033  . Ipratropium-Albuterol (COMBIVENT) respimat 1 puff  1 puff Inhalation Q6H Fuller Plan A, MD   1 puff at 01/16/20 2131  . latanoprost (XALATAN) 0.005 % ophthalmic solution 1 drop  1 drop Both Eyes QHS Fuller Plan A, MD   1 drop at 01/16/20 2133  . linagliptin (TRADJENTA) tablet 5 mg  5 mg Oral Daily  Tamala Julian, Rondell A, MD   5 mg at 01/17/20 1033  . methylPREDNISolone sodium succinate (SOLU-MEDROL) 40 mg/mL injection 40 mg  40 mg Intravenous Q12H Jonetta Osgood, MD   40 mg at 01/17/20 1033  . ondansetron (ZOFRAN) tablet 4 mg  4 mg Oral Q6H PRN Fuller Plan A, MD       Or  . ondansetron (ZOFRAN) injection 4 mg  4 mg Intravenous Q6H PRN Fuller Plan A, MD   4 mg at 01/13/20 1504  . oxyCODONE-acetaminophen (PERCOCET/ROXICET) 5-325 MG per tablet 1-2 tablet  1-2 tablet Oral Q6H PRN Norval Morton, MD   2 tablet at 01/17/20 1049  . pantoprazole (PROTONIX) EC tablet 40 mg  40 mg Oral Q1200 Jonetta Osgood, MD   40 mg at 01/16/20 1249  . prednisoLONE acetate (PRED FORTE) 1 % ophthalmic suspension 1 drop  1 drop Right Eye  BID Fuller Plan A, MD   1 drop at 01/17/20 1045  . sodium bicarbonate tablet 650 mg  650 mg Oral BID Harrie Jeans C, MD      . sodium chloride flush (NS) 0.9 % injection 3 mL  3 mL Intravenous Once Maudie Flakes, MD      . zinc sulfate capsule 220 mg  220 mg Oral Daily Fuller Plan A, MD   220 mg at 01/17/20 1030     Discharge Medications: Please see discharge summary for a list of discharge medications.  Relevant Imaging Results:  Relevant Lab Results:   Additional Information SSN: [id number]  Alberteen Sam, LCSW

## 2020-01-17 NOTE — Progress Notes (Signed)
PROGRESS NOTE                                                                                                                                                                                                             Patient Demographics:    [name], is a 64 y.o. female, DOB - [birth year], [id number]  Outpatient Primary MD for the patient is Pavelock, Ralene Bathe, MD   Admit date - 12/29/2019   LOS - 4  Chief Complaint  Patient presents with  . Shortness of Breath  . Chest Pain       Brief Narrative: Patient is a 64 y.o. female  PMHx of hypertension, HLD, DM-2, CKD stage IV, PVD s/p stent to left distal SFA on [id number], chronic diastolic heart failure-presented with shortness of breath or cough-found to have acute hypoxic respiratory failure secondary to COVID-19 pneumonia and decompensated diastolic heart failure.  Significant Events: 4/18-4/23>> admit to [facility]-s/p stent to left distal SFA 4/27>> admit to [facility] for hypoxia from Covid/heart failure 4/27>> lower extremity Dopplers negative for DVT. [id number] grade 2 diastolic dysfunction, moderate RV systolic dysfunction.  RVSP 62.6 mmHg. 4/30>> VQ scan-low probability PE  COVID-19 medications: Steroids: 4/27>> Remdesivir: 4/27>>  Antibiotics: Vancomycin: 4/27 x 1 Cefepime: 4/27 x 1  Microbiology data: 4/27: Blood culture>> no growth 4/28: Blood culture>> no growth  DVT prophylaxis: SQ heparin  Procedures: None  Consults: None    Subjective:   Still short of breath with minimal activity-slightly more awake but still with very slow/short answers.  Requiring anywhere from 6-8 L of HFNC.   Assessment  & Plan :   Acute Hypoxic Resp Failure due to Covid 19 Viral pneumonia and decompensated diastolic heart failure: Remains tenuous but stable on 6-8 L of oxygen.  Continue steroids/remdesivir-she still appears slightly volume overloaded but better than how  she presented with.  VQ scan without PE.  Continue close monitoring and supportive care.  COVID-19 pneumonia: Hypoxia essentially unchanged compared to yesterday-anywhere from 6-8 L.  Continue steroids/remdesivir-s/p Actemra on 4/30.  Follow clinical trajectory and inflammatory markers.  Fever: afebrile  O2 requirements:  SpO2: (!) 77 % O2 Flow Rate (L/min): 6 L/min   COVID-19 Labs: Recent Labs    01/15/20 0528 01/16/20 0632 01/17/20 0831  DDIMER 2.30* 3.05* 3.71*  FERRITIN 1,624* 1,544* 1,538*  CRP 14.5* 9.3*  7.0*       Component Value Date/Time   BNP 134.9 (H) 01/17/2020 0831    Recent Labs  Lab 01/13/20 2200  PROCALCITON 0.21    Lab Results  Component Value Date   SARSCOV2NAA POSITIVE (A) 01/13/2020   [place] NEGATIVE 01/05/2020   [place]vo Not Detected 11/24/2019     Prone/Incentive Spirometry: encouraged  incentive spirometry use 3-4/hour.  Decompensated diastolic heart failure: Volume status improved compared to admission-less edema in her lower extremities-but still very hypoxic-due to worsening renal function-diuretics on hold.  Suspect worsening hypoxemia is likely secondary to COVID-19-see above.  Continue to follow weights, electrolytes and volume status closely.  Pulmonary hypertension/RV dysfunction: Not seen in prior echo's-I suspect this is probably from OSA-she has been noncompliant to CPAP, but given her worsening hypoxemia-she underwent a VQ scan-which was low probability for PE.  Lower extremity Dopplers negative.  Briefly on IV heparin-but have switched back to prophylactic dosing.   AKI on CKD stage IV: Creatinine continues to worsen-AKI likely hemodynamically mediated.  Renal ultrasound without any hydronephrosis.  Nephrology following.    Acute metabolic encephalopathy: Remains slightly lethargic-slow to respond at times-but is awake and alert.  Probably secondary to hypoxia, renal failure.  Follow for now.    Elevated troponin: Trend is  flat-not consistent with ACS-likely secondary to underlying CKD-with some component from demand ischemia due to COVID-19/heart failure.  PAD-s/p stent to left SFA on 4/22: Both lower extremities appear warm-continue aspirin, Plavix and statin.  HTN: BP stable-continue Cardura-Lasix and lisinopril on hold.    DM-2 with hyperglycemia (A1c 12.2 on 4/19): CBGs relatively stable-continue Lantus 26 units twice daily-SSI.  Follow and optimize.  Recent Labs    01/16/20 2101 01/17/20 0740 01/17/20 0921  GLUCAP 184* 142* 157*    Thrombocytopenia: Resolved.  Anemia of chronic disease: New baseline-likely related to CKD.  Gout: No flare-continue colchicine  OSA: Noncompliant to CPAP-continue CPAP nightly if possible in the hospital.  Obesity: Estimated body mass index is 45.74 kg/m as calculated from the following:   Height as of this encounter: 5\' 1"  (1.549 m).   Weight as of this encounter: 109.8 kg.   ABG:    Component Value Date/Time   PHART 7.460 (H) 09/30/2008 1120   PCO2ART 41.0 09/30/2008 1120   PO2ART 83.0 09/30/2008 1120   HCO3 26.4 12/06/2016 0922   HCO3 26.1 12/06/2016 0922   TCO2 28 12/06/2016 0922   TCO2 28 12/06/2016 0922   O2SAT 69.0 12/06/2016 0922   O2SAT 67.0 12/06/2016 0922    Vent Settings: N/A  Condition - Extremely Guarded  Family Communication  : Left voicemail for daughter on 5/1  Code Status :  Full Code  Diet :  Diet Order            Diet heart healthy/carb modified Room service appropriate? Yes; Fluid consistency: Thin; Fluid restriction: 2000 mL Fluid  Diet effective now               Disposition Plan  :   Status is: Inpatient  Remains inpatient appropriate because:Inpatient level of care appropriate due to severity of illness  Dispo: The patient is from: Home              Anticipated d/c is to: Home              Anticipated d/c date is: > 3 days              Patient currently is not medically stable  to d/c.  Barriers to  discharge: Hypoxia requiring O2 supplementation/complete 5 days of IV Remdesivir, worsening AKI  Antimicorbials  :    Anti-infectives (From admission, onward)   Start     Dose/Rate Route Frequency Ordered Stop   01/15/20 1200  vancomycin (VANCOREADY) IVPB 1250 mg/250 mL  Status:  Discontinued     1,250 mg 166.7 mL/hr over 90 Minutes Intravenous Every 48 hours 01/13/20 1018 01/14/20 1038   01/14/20 1000  remdesivir 100 mg in sodium chloride 0.9 % 100 mL IVPB     100 mg 200 mL/hr over 30 Minutes Intravenous Daily 01/13/20 1129 01/17/20 1113   01/13/20 1630  valACYclovir (VALTREX) tablet 1,000 mg  Status:  Discontinued     1,000 mg Oral 3 times daily 01/13/20 1557 01/17/20 1147   01/13/20 1230  remdesivir 200 mg in sodium chloride 0.9% 250 mL IVPB     200 mg 580 mL/hr over 30 Minutes Intravenous Once 01/13/20 1129 01/13/20 1800   01/13/20 1015  ceFEPIme (MAXIPIME) 2 g in sodium chloride 0.9 % 100 mL IVPB     2 g 200 mL/hr over 30 Minutes Intravenous  Once 01/13/20 1003 01/13/20 1434   01/13/20 1015  vancomycin (VANCOREADY) IVPB 2000 mg/400 mL     2,000 mg 200 mL/hr over 120 Minutes Intravenous  Once 01/13/20 1012 01/13/20 2258      Inpatient Medications  Scheduled Meds: . vitamin C  500 mg Oral Daily  . aspirin EC  81 mg Oral Q breakfast  . atorvastatin  40 mg Oral Daily  . brimonidine  1 drop Both Eyes BID  . clopidogrel  75 mg Oral Daily  . dorzolamide-timolol  1 drop Both Eyes BID  . doxazosin  4 mg Oral QHS  . heparin injection (subcutaneous)  5,000 Units Subcutaneous Q8H  . insulin aspart  0-20 Units Subcutaneous TID WC  . insulin aspart  0-5 Units Subcutaneous QHS  . insulin glargine  26 Units Subcutaneous BID  . Ipratropium-Albuterol  1 puff Inhalation Q6H  . latanoprost  1 drop Both Eyes QHS  . linagliptin  5 mg Oral Daily  . methylPREDNISolone (SOLU-MEDROL) injection  40 mg Intravenous Q12H  . pantoprazole  40 mg Oral Q1200  . prednisoLONE acetate  1 drop Right Eye  BID  . sodium chloride flush  3 mL Intravenous Once  . zinc sulfate  220 mg Oral Daily   Continuous Infusions:  PRN Meds:.acetaminophen, chlorpheniramine-HYDROcodone, guaiFENesin-dextromethorphan, ondansetron **OR** ondansetron (ZOFRAN) IV, oxyCODONE-acetaminophen   Time Spent in minutes  35  See all Orders from today for further details   Oren Binet M.D on 01/17/2020 at 11:48 AM  To page go to [email or web address] - use universal password  [facility] -  Office  [phone]    Objective:   Vitals:   01/16/20 1943 01/16/20 2340 01/17/20 0347 01/17/20 0738  BP: (!) 142/60 (!) 135/51 (!) 146/66 (!) 130/54  Pulse: 61 75 77 65  Resp: 17 17 (!) 21 (!) 22  Temp: 97.7 F (36.5 C) 97.8 F (36.6 C) 98 F (36.7 C) 97.6 F (36.4 C)  TempSrc: Oral Oral Oral Oral  SpO2: 97% 92% 92% (!) 77%  Weight:      Height:        Wt Readings from Last 3 Encounters:  01/16/20 109.8 kg  01/08/20 115.6 kg  07/15/19 102.9 kg     Intake/Output Summary (Last 24 hours) at 01/17/2020 1148 Last data filed at 01/17/2020 1100 Gross per 24 hour  Intake 1254.45 ml  Output 300 ml  Net 954.45 ml    Physical Exam Gen Exam: Slightly lethargic-but alert awake HEENT:atraumatic, normocephalic Chest: B/L clear to auscultation anteriorly CVS:S1S2 regular Abdomen:soft non tender, non distended Extremities:+ edema Neurology: Non focal Skin: no rash   Data Review:    CBC Recent Labs  Lab 01/13/20 0002 01/14/20 0307 01/15/20 0528 01/16/20 0632 01/17/20 0831  WBC 5.3 5.1 9.1 14.7* 16.8*  HGB 9.8* 10.0* 10.1* 10.0* 10.8*  HCT 30.7* 31.4* 31.2* 30.9* 32.7*  PLT 110* 143* 208 273 323  MCV 94.8 94.6 92.0 92.2 90.1  MCH 30.2 30.1 29.8 29.9 29.8  MCHC 31.9 31.8 32.4 32.4 33.0  RDW 17.1* 17.1* 16.9* 16.9* 16.7*  LYMPHSABS  --  0.5* 1.0 1.3 1.4  MONOABS  --  0.2 0.4 0.8 1.1*  EOSABS  --  0.0 0.0 0.0 0.0  BASOSABS  --  0.0 0.0 0.0 0.0    Chemistries  Recent Labs  Lab 01/13/20 0002  01/14/20 0307 01/15/20 0528 01/16/20 0632 01/17/20 0831  NA 135 140 140 140 140  K 4.2 4.5 4.7 4.7 4.5  CL 106 109 109 109 109  CO2 19* 19* 19* 20* 19*  GLUCOSE 238* 196* 253* 217* 159*  BUN 40* 44* 63* 81* 94*  CREATININE 2.04* 2.08* 2.48* 2.96* 3.11*  CALCIUM 7.9* 8.1* 8.2* 8.3* 8.6*  MG  --  1.6* 2.0 2.1 2.4  AST  --  62* 57* 60* 69*  ALT  --  34 31 32 43  ALKPHOS  --  38 47 59 71  BILITOT  --  0.8 0.6 0.9 0.9   ------------------------------------------------------------------------------------------------------------------ No results for input(s): CHOL, HDL, LDLCALC, TRIG, CHOLHDL, LDLDIRECT in the last 72 hours.  Lab Results  Component Value Date   HGBA1C 12.2 (H) 01/05/2020   ------------------------------------------------------------------------------------------------------------------ No results for input(s): TSH, T4TOTAL, T3FREE, THYROIDAB in the last 72 hours.  Invalid input(s): FREET3 ------------------------------------------------------------------------------------------------------------------ Recent Labs    01/16/20 0632 01/17/20 0831  FERRITIN 1,544* 1,538*    Coagulation profile No results for input(s): INR, PROTIME in the last 168 hours.  Recent Labs    01/16/20 0632 01/17/20 0831  DDIMER 3.05* 3.71*    Cardiac Enzymes No results for input(s): CKMB, TROPONINI, MYOGLOBIN in the last 168 hours.  Invalid input(s): CK ------------------------------------------------------------------------------------------------------------------    Component Value Date/Time   BNP 134.9 (H) 01/17/2020 0831    Micro Results Recent Results (from the past 240 hour(s))  Respiratory Panel by RT PCR (Flu A&B, Covid) - Nasopharyngeal Swab     Status: Abnormal   Collection Time: 01/13/20 10:25 AM   Specimen: Nasopharyngeal Swab  Result Value Ref Range Status   SARS Coronavirus 2 by RT PCR POSITIVE (A) NEGATIVE Final    Comment: RESULT CALLED TO, READ BACK BY  AND VERIFIED WITH: Sheran Lawless RN 12:25 01/13/20 ([email or web address]) (NOTE) SARS-CoV-2 target nucleic acids are DETECTED. SARS-CoV-2 RNA is generally detectable in upper respiratory specimens  during the acute phase of infection. Positive results are indicative of the presence of the identified virus, but do not rule out bacterial infection or co-infection with other pathogens not detected by the test. Clinical correlation with patient history and other diagnostic information is necessary to determine patient infection status. The expected result is Negative. Fact Sheet for Patients:  PinkCheek.be Fact Sheet for Healthcare Providers: GravelBags.it This test is not yet approved or cleared by the [place] FDA and  has been authorized for detection and/or diagnosis of SARS-CoV-2 by FDA  under an Emergency Use Authorization (EUA).  This EUA will remain in effect (meaning this test can be used)  for the duration of  the COVID-19 declaration under Section 564(b)(1) of the Act, 21 U.S.C. section 360bbb-3(b)(1), unless the authorization is terminated or revoked sooner.    Influenza A by PCR NEGATIVE NEGATIVE Final   Influenza B by PCR NEGATIVE NEGATIVE Final    Comment: (NOTE) The Xpert Xpress SARS-CoV-2/FLU/RSV assay is intended as an aid in  the diagnosis of influenza from Nasopharyngeal swab specimens and  should not be used as a sole basis for treatment. Nasal washings and  aspirates are unacceptable for Xpert Xpress SARS-CoV-2/FLU/RSV  testing. Fact Sheet for Patients: PinkCheek.be Fact Sheet for Healthcare Providers: GravelBags.it This test is not yet approved or cleared by the [place] FDA and  has been authorized for detection and/or diagnosis of SARS-CoV-2 by  FDA under an Emergency Use Authorization (EUA). This EUA will remain  in effect (meaning this test can be used)  for the duration of the  Covid-19 declaration under Section 564(b)(1) of the Act, 21  U.S.C. section 360bbb-3(b)(1), unless the authorization is  terminated or revoked. Performed at [facility], [place] [address], [place], [place] [address]   Blood culture (routine x 2)     Status: None (Preliminary result)   Collection Time: 01/13/20 11:10 AM   Specimen: BLOOD  Result Value Ref Range Status   Specimen Description BLOOD LEFT ANTECUBITAL  Final   Special Requests   Final    BOTTLES DRAWN AEROBIC AND ANAEROBIC Blood Culture results may not be optimal due to an inadequate volume of blood received in culture bottles   Culture   Final    NO GROWTH 4 DAYS Performed at [facility], [place] [address] [place], [place] [address]    Report Status PENDING  Incomplete  Blood culture (routine x 2)     Status: None (Preliminary result)   Collection Time: 01/14/20  8:51 AM   Specimen: BLOOD LEFT HAND  Result Value Ref Range Status   Specimen Description BLOOD LEFT HAND  Final   Special Requests   Final    BOTTLES DRAWN AEROBIC AND ANAEROBIC Blood Culture results may not be optimal due to an inadequate volume of blood received in culture bottles   Culture   Final    NO GROWTH 3 DAYS Performed at [facility], [place] [address], [place], [place] [address]    Report Status PENDING  Incomplete    Radiology Reports DG Chest 2 View  Result Date: 01/13/2020 CLINICAL DATA:  Shortness of breath and bilateral leg swelling. EXAM: CHEST - 2 VIEW COMPARISON:  November 30, 2016 FINDINGS: Moderate severity diffuse multifocal infiltrates are seen, bilaterally. There is no evidence of a pleural effusion or pneumothorax. The cardiac silhouette is mildly enlarged. A radiopaque fusion plate and screws are seen overlying the lower cervical spine. The visualized skeletal structures are otherwise unremarkable. Radiopaque surgical clips are seen within the right upper quadrant. IMPRESSION: Moderate severity  bilateral multifocal infiltrates. Electronically Signed   By: Virgina Norfolk M.D.   On: 01/13/2020 00:27   NM Pulmonary Perfusion  Result Date: 01/16/2020 CLINICAL DATA:  Shortness of breath, history of COVID-19 positivity EXAM: NUCLEAR MEDICINE PERFUSION LUNG SCAN TECHNIQUE: Perfusion images were obtained in multiple projections after intravenous injection of radiopharmaceutical. Ventilation scans intentionally deferred if perfusion scan and chest x-ray adequate for interpretation during COVID 19 epidemic. RADIOPHARMACEUTICALS:  1.6 mCi Tc-35m  MAA IV COMPARISON:  Chest x-ray from earlier in the same day. FINDINGS: Perfusion images demonstrate adequate uptake throughout both lungs despite the changes seen on recent chest x-ray. No focal defect to suggest pulmonary embolism is noted. IMPRESSION: No evidence of pulmonary embolism. Electronically Signed   By: Inez Catalina M.D.   On: 01/16/2020 16:23   US RENAL  Result Date: 01/16/2020 CLINICAL DATA:  Acute renal insufficiency. EXAM: RENAL / URINARY TRACT ULTRASOUND COMPLETE COMPARISON:  None. FINDINGS: Exam difficult due to patient body habitus. Right Kidney: Renal measurements: 9.4 x 4.2 x 4.5 cm = volume: 94 mL . Echogenicity within normal limits. Mild cortical thinning. No mass or hydronephrosis visualized. Left Kidney: Renal measurements: 10.1 x 5.2 x 5.5 cm = volume: 150 mL. Echogenicity within normal limits. Mild cortical thinning. No mass or hydronephrosis visualized. Bladder: Not visualized. Other: None. IMPRESSION: Normal size kidneys without hydronephrosis. Electronically Signed   By: Marin Olp M.D.   On: 01/16/2020 16:38   US RENAL  Result Date: 01/06/2020 CLINICAL DATA:  Chronic kidney disease EXAM: RENAL / URINARY TRACT ULTRASOUND COMPLETE COMPARISON:  None. FINDINGS: Right Kidney: Renal measurements: 9.3 x 4.3 x 4.6 cm = volume: 96 mL. Lobulated contour. No hydronephrosis. Normal echogenicity. Left Kidney: Renal measurements: 10.7 x 5.0  x 5.2 cm = volume: 145 mL. Lobulated contour without hydronephrosis. Normal echogenicity. Bladder: Decompressed. Other: Visualization is generally limited by body habitus. IMPRESSION: No hydronephrosis. Lobulated renal contours without focal parenchymal abnormality. Electronically Signed   By: Ulyses Jarred M.D.   On: 01/06/2020 16:16   PERIPHERAL VASCULAR CATHETERIZATION  Result Date: 01/08/2020 Patient name: Ammie Menzel   MRN: [id number]        DOB: 1956/06/04            Sex: female  01/08/2020 Pre-operative Diagnosis: Critical limb ischemia of the left lower extremity with tissue loss Post-operative diagnosis:  Same Surgeon:  Marty Heck, MD Procedure Performed: 1.  Ultrasound guided access of the right common femoral artery 2.  Aortogram with CO2 including catheter selection of the aorta 3.  Left lower extremity arteriogram with selection of third order branches 4.  Left distal SFA above-knee popliteal artery angioplasty with stent placement (primarily stented with a 6 mm x 60 mm drug-coated Eluvia postdilated with a 5 mm Mustang) 5.  Left anterior tibial angioplasty for focal perforation with resolution of the perforation (3 mm Sterling) 6.  Mynx closure of the right common femoral artery 7.  55 minutes of monitored moderate conscious sedation time   Indications: Patient is a 64 year old female who was seen in consultation by Dr. Trula Slade for left lower extremity rest pain with severely depressed ABIs and duplex showing likely popliteal disease.  She presents today for left lower extremity arteriogram after risk benefits discussed.  Findings:  Aortogram was performed with CO2 to limit contrast and there was no flow-limiting aortoiliac stenosis.  Left lower extremity arteriogram showed a patent common femoral profunda and widely patent SFA in the proximal to mid segment.  At [facility] canal in the distal SFA and above-knee popliteal artery showed a very short focal occlusion with large collateral  around this.  She had a patent below-knee popliteal artery with three-vessel runoff.  Anterior tibial appeared to be dominant runoff.   Ultimately this lesion was crossed in the distal SFA/above knee popliteal artery and primarily stented with a 6 mm x 60 mm Eluvia.  She now has excellent inline flow down the left lower extremity with no  residual stenosis and three-vessel runoff.  There was a focal perforation in the anterior tibial where our wire likely made a perforation during exchange of balloons and stents.  Ultimately this was treated with a very low inflation of a 3 mm Sterling for 3 minutes with resolution.  Patient has preserved three vessel runoff.              Procedure:  The patient was identified in the holding area and taken to room 8.  The patient was then placed supine on the table and prepped and draped in the usual sterile fashion.  A time out was called.  Ultrasound was used to evaluate the right common femoral artery.  It was patent .  A digital ultrasound image was acquired.  A micropuncture needle was used to access the right common femoral artery under ultrasound guidance.  An 018 wire was advanced without resistance and a micropuncture sheath was placed.  The 018 wire was removed and a benson wire was placed.  The micropuncture sheath was exchanged for a 5 french sheath.  An omniflush catheter was advanced over the wire to the level of L-1.  An abdominal angiogram was obtained.  Next, using glidewire and straight 4 French flush catheter the aortic bifurcation was cross because the Omni would not track.  Placed the catheter into theleft external iliac artery and left runoff was obtained.  Ultimately after evaluating the pictures elected to intervene on the distal left SFA above-knee popliteal occlusion.  Used a long Glidewire advantage down the SFA and exchanged for a 6 [place] Ansell sheath in the right groin over the aortic bifurcation.  Patient was given 100 units per kilogram heparin.  I  then primarily stented this lesion with a 6 mm x 60 mm Eluvia that was postdilated with a 5 mm x 60 mm Mustang.  Final pictures showed no residual stenosis with excellent inline flow down the left lower extremity and three-vessel runoff.  There was a focal perforation in the proximal anterior tibial that I think was from our wire.  I then exchanged for a V 18 wire down the anterior tibial and then did a very slow inflation with a 3 mm Sterling in the left anterior tibial artery for 3 minutes.  There appeared to be resolution of the perforation after taking multiple additional pictures.  Wires and catheters removed and a short 6 French sheath was placed in the right groin.  We placed a mynx closure device.   Plan: Patient will be loaded on Plavix and will need to continue aspirin and Plavix.  Should have inline flow down left leg now.   Marty Heck, MD [facility] Office: [phone]   DG Chest Port 1V same Day  Result Date: 01/16/2020 CLINICAL DATA:  Shortness of breath.  COVID. EXAM: PORTABLE CHEST 1 VIEW COMPARISON:  Three days ago FINDINGS: More confluent bilateral interstitial and airspace opacity. Cardiomegaly. No visible effusion or pneumothorax. IMPRESSION: Worsening bilateral pneumonia. Electronically Signed   By: Monte Fantasia M.D.   On: 01/16/2020 08:34   DG Foot Complete Left  Result Date: 01/05/2020 CLINICAL DATA:  Swelling EXAM: LEFT FOOT - COMPLETE 3+ VIEW COMPARISON:  None. FINDINGS: Degenerative changes in the 1st MTP joint. No acute bony abnormality. Specifically, no fracture, subluxation, or dislocation. Vascular calcifications noted. No bone destruction seen to suggest osteomyelitis. IMPRESSION: No acute bony abnormality. Electronically Signed   By: Rolm Baptise M.D.   On: 01/05/2020 01:25  VAS [place] ABI WITH/WO TBI  Result Date: 01/05/2020 LOWER EXTREMITY DOPPLER STUDY Indications: Blue, cold toes. High Risk Factors: Hypertension,  Diabetes.  Limitations: Today's exam was limited due to Patient body habitus. Comparison Study: No prior studies. Performing Technologist: Carlos Levering Rvt  Examination Guidelines: A complete evaluation includes at minimum, Doppler waveform signals and systolic blood pressure reading at the level of bilateral brachial, anterior tibial, and posterior tibial arteries, when vessel segments are accessible. Bilateral testing is considered an integral part of a complete examination. Photoelectric Plethysmograph (PPG) waveforms and toe systolic pressure readings are included as required and additional duplex testing as needed. Limited examinations for reoccurring indications may be performed as noted.  ABI Findings: +---------+------------------+-----+---------+--------+ Right    Rt Pressure (mmHg)IndexWaveform Comment  +---------+------------------+-----+---------+--------+ Brachial 150                    triphasic         +---------+------------------+-----+---------+--------+ PTA      108               0.72 biphasic          +---------+------------------+-----+---------+--------+ DP       137               0.91 triphasic         +---------+------------------+-----+---------+--------+ Great Toe76                0.51                   +---------+------------------+-----+---------+--------+ +---------+------------------+-----+----------+-------+ Left     Lt Pressure (mmHg)IndexWaveform  Comment +---------+------------------+-----+----------+-------+ Brachial 136                    triphasic         +---------+------------------+-----+----------+-------+ PTA      66                0.44 monophasic        +---------+------------------+-----+----------+-------+ DP       53                0.35 monophasic        +---------+------------------+-----+----------+-------+ Great Toe0                 0.00                    +---------+------------------+-----+----------+-------+ +-------+-----------+-----------+------------+------------+ ABI/TBIToday's ABIToday's TBIPrevious ABIPrevious TBI +-------+-----------+-----------+------------+------------+ Right  0.91       0.51                                +-------+-----------+-----------+------------+------------+ Left   0.44       0                                   +-------+-----------+-----------+------------+------------+  Summary: Right: Resting right ankle-brachial index indicates mild right lower extremity arterial disease. The right toe-brachial index is abnormal. Left: Resting left ankle-brachial index indicates severe left lower extremity arterial disease. The left toe-brachial index is abnormal.  *See table(s) above for measurements and observations.  Electronically signed by [name] MD on 01/05/2020 at 7:35:40 PM.    Final    VAS [place] LOWER EXTREMITY ARTERIAL DUPLEX  Result Date: 01/05/2020 LOWER EXTREMITY ARTERIAL DUPLEX STUDY Indications: Blue, cold toes. High Risk Factors: Hypertension, Diabetes.  Current ABI:  R 0.91 L 0.44 Limitations: Patient body habitus, poor ultrasound/tissue interface. Comparison Study: No prior studies. Performing Technologist: Oliver Hum RVT  Examination Guidelines: A complete evaluation includes B-mode imaging, spectral Doppler, color Doppler, and power Doppler as needed of all accessible portions of each vessel. Bilateral testing is considered an integral part of a complete examination. Limited examinations for reoccurring indications may be performed as noted.  +-----------+--------+-----+--------+----------+------------------+ LEFT       PSV cm/sRatioStenosisWaveform  Comments           +-----------+--------+-----+--------+----------+------------------+ EIA Distal 116                  biphasic                     +-----------+--------+-----+--------+----------+------------------+ CFA Mid    132                   biphasic                     +-----------+--------+-----+--------+----------+------------------+ DFA        111                  biphasic                     +-----------+--------+-----+--------+----------+------------------+ SFA Prox   104                  biphasic                     +-----------+--------+-----+--------+----------+------------------+ SFA Mid    85                   biphasic                     +-----------+--------+-----+--------+----------+------------------+ SFA Distal 91                   biphasic                     +-----------+--------+-----+--------+----------+------------------+ POP Prox   26                   monophasic                   +-----------+--------+-----+--------+----------+------------------+ POP Distal 0                              Unable to insonate +-----------+--------+-----+--------+----------+------------------+ ATA Prox   73                   biphasic                     +-----------+--------+-----+--------+----------+------------------+ ATA Mid    33                   monophasic                   +-----------+--------+-----+--------+----------+------------------+ ATA Distal 32                   monophasic                   +-----------+--------+-----+--------+----------+------------------+ PTA Prox  Unable to insonate +-----------+--------+-----+--------+----------+------------------+ PTA Mid    27                   monophasic                   +-----------+--------+-----+--------+----------+------------------+ PTA Distal 9                    monophasic                   +-----------+--------+-----+--------+----------+------------------+ PERO Prox                                 Unable to insonate +-----------+--------+-----+--------+----------+------------------+ PERO Mid                                  Unable to  insonate +-----------+--------+-----+--------+----------+------------------+ PERO Distal15                   monophasic                   +-----------+--------+-----+--------+----------+------------------+ DP         29                   monophasic                   +-----------+--------+-----+--------+----------+------------------+  Summary: Left: The external iliac, common femoral, and superficial femoral arteries appear patent with no significant stenosis. Monophasic waveforms are noted in the proximal popliteal artery with poor visualization of the distal segment. Which suggests a possible occlusion. Anterior tibial artery appears patent throughout with monophasic flow. The posterior tibial, and peroneal arteries demonstrated monophasic flow with likely areas of occlusive disease. The proximal calf arteries were difficult to visualize.  See table(s) above for measurements and observations. Electronically signed by [name] MD on 01/05/2020 at 35:34:41 PM.    Final    VAS [place] LOWER EXTREMITY VENOUS (DVT)  Result Date: 01/13/2020  Lower Venous DVTStudy Indications: Swelling.  Limitations: Body habitus and poor ultrasound/tissue interface. Performing Technologist: Antonieta Pert RDMS, RVT  Examination Guidelines: A complete evaluation includes B-mode imaging, spectral Doppler, color Doppler, and power Doppler as needed of all accessible portions of each vessel. Bilateral testing is considered an integral part of a complete examination. Limited examinations for reoccurring indications may be performed as noted. The reflux portion of the exam is performed with the patient in reverse Trendelenburg.  +---------+---------------+---------+-----------+----------+-------------------+ RIGHT    CompressibilityPhasicitySpontaneityPropertiesThrombus Aging      +---------+---------------+---------+-----------+----------+-------------------+ CFV      Partial        Yes      Yes                   patient body                                                              habitus and  position limiting                                                         evaluation          +---------+---------------+---------+-----------+----------+-------------------+ SFJ      Full                                                             +---------+---------------+---------+-----------+----------+-------------------+ FV Prox                          Yes                                      +---------+---------------+---------+-----------+----------+-------------------+ FV Mid                           Yes                                      +---------+---------------+---------+-----------+----------+-------------------+ FV Distal                        Yes                                      +---------+---------------+---------+-----------+----------+-------------------+ PFV      Full                                                             +---------+---------------+---------+-----------+----------+-------------------+ POP      Full                                                             +---------+---------------+---------+-----------+----------+-------------------+ PTV                                                   not clearly                                                               visualized          +---------+---------------+---------+-----------+----------+-------------------+ PERO  not clearly                                                               visualized          +---------+---------------+---------+-----------+----------+-------------------+   +---------+---------------+---------+-----------+----------+------------------+ LEFT      CompressibilityPhasicitySpontaneityPropertiesThrombus Aging     +---------+---------------+---------+-----------+----------+------------------+ CFV      Full           Yes      Yes                                     +---------+---------------+---------+-----------+----------+------------------+ SFJ      Full                                                            +---------+---------------+---------+-----------+----------+------------------+ FV Prox  Full                                                            +---------+---------------+---------+-----------+----------+------------------+ FV Mid   Full                                                            +---------+---------------+---------+-----------+----------+------------------+ FV Distal                        Yes                                     +---------+---------------+---------+-----------+----------+------------------+ PFV      Full                                                            +---------+---------------+---------+-----------+----------+------------------+ POP      Full           Yes      Yes                                     +---------+---------------+---------+-----------+----------+------------------+ PTV                                                   not clearly  visualized         +---------+---------------+---------+-----------+----------+------------------+ PERO                                                  not clearly                                                              visualized         +---------+---------------+---------+-----------+----------+------------------+    Summary: RIGHT: - There is no evidence of deep vein thrombosis in the lower extremity. However, portions of this examination were limited- see technologist comments above.  LEFT: - There is no  evidence of deep vein thrombosis in the lower extremity. However, portions of this examination were limited- see technologist comments above.  - Prominent lymph nodes noted bilateral groin areas.  *See table(s) above for measurements and observations. Electronically signed by Deitra Mayo MD on 01/13/2020 at 6:50:27 PM.    Final    ECHOCARDIOGRAM LIMITED  Result Date: 01/15/2020    ECHOCARDIOGRAM LIMITED REPORT   Patient Name:   DEEYA REDIFER Date of Exam: 01/15/2020 Medical Rec #:  [id number]     Height:       61.0 in Accession #:    [id number]    Weight:       249.3 lb Date of Birth:  24-Jun-1956      BSA:          2.074 m Patient Age:    7 years      BP:           131/70 mmHg Patient Gender: F             HR:           53 bpm. Exam Location:  Inpatient Procedure: Limited Echo, Limited Color Doppler and Cardiac Doppler Indications:    Acute Respiratory Insufficiency R06.89  History:        Patient has prior history of Echocardiogram examinations, most                 recent 12/02/2016. Risk Factors:Hypertension and Dyslipidemia.  Sonographer:    Mikki Santee RDCS (AE) Referring Phys: [place]  1. Left ventricular diastolic parameters are consistent with Grade II diastolic dysfunction (pseudonormalization).  2. Right ventricular systolic function is moderately reduced. There is severely elevated pulmonary artery systolic pressure.  3. The inferior vena cava is normal in size with greater than 50% respiratory variability, suggesting right atrial pressure of 3 mmHg. FINDINGS  Left Ventricle: Indeterminate filling pressures. Right Ventricle: Right ventricular systolic function is moderately reduced. There is severely elevated pulmonary artery systolic pressure. The tricuspid regurgitant velocity is 3.86 m/s, and with an assumed right atrial pressure of 3 mmHg, the estimated right ventricular systolic pressure is [id number] mmHg. Tricuspid Valve: Tricuspid valve regurgitation is mild.  Venous: The inferior vena cava is normal in size with greater than 50% respiratory variability, suggesting right atrial pressure of 3 mmHg.  LEFT VENTRICLE PLAX 2D LVOT diam:     2.10 cm  Diastology LV SV:         81  LV e' lateral:   5.61 cm/s LV SV Index:   39       LV E/e' lateral: 12.1 LVOT Area:     3.46 cm LV e' medial:    6.40 cm/s                         LV E/e' medial:  10.6  RIGHT VENTRICLE TAPSE (M-mode): 1.5 cm LEFT ATRIUM           Index       RIGHT ATRIUM           Index LA Vol (A4C): 37.4 ml 18.03 ml/m RA Area:     16.80 cm                                   RA Volume:   44.50 ml  21.45 ml/m  AORTIC VALVE LVOT Vmax:   98.60 cm/s LVOT Vmean:  69.800 cm/s LVOT VTI:    0.235 m MITRAL VALVE               TRICUSPID VALVE MV Area (PHT): 3.53 cm    TR Peak grad:   59.6 mmHg MV Decel Time: 215 msec    TR Vmax:        386.00 cm/s MV E velocity: 67.70 cm/s MV A velocity: 65.10 cm/s  SHUNTS MV E/A ratio:  1.04        Systemic VTI:  0.24 m                            Systemic Diam: 2.10 cm Skeet Latch MD Electronically signed by Skeet Latch MD Signature Date/Time: 01/15/2020/3:50:03 PM    Final

## 2020-01-17 NOTE — TOC Initial Note (Signed)
Transition of Care [facility]) - Initial/Assessment Note    Patient Details  Name: [name] MRN: [id number] Date of Birth: 01-25-1956  [facility]) CM/SW Contact:    Alberteen Sam, LCSW Phone Number: 01/17/2020, 12:37 PM  Clinical Narrative:                  CSW spoke with patient over the phone with assistance from RN. Patient was informed of SNF recommendation and is in agreement. CSW informed of 3 facilities to choose from [facility], [facility], or [facility]. Patient agreeable to [facility] as she would prefer to stay in [place].   CSW has faxed out referral, pending bed offer at this time.  Expected Discharge Plan: Skilled Nursing Facility Barriers to Discharge: Continued Medical Work up   Patient Goals and CMS Choice Patient states their goals for this hospitalization and ongoing recovery are:: to go to rehab CMS Medicare.gov Compare Post Acute Care list provided to:: Patient Choice offered to / list presented to : Patient  Expected Discharge Plan and Services Expected Discharge Plan: [place] Choice: [place] arrangements for the past 2 months: Single Family Home                                      Prior Living Arrangements/Services Living arrangements for the past 2 months: Single Family Home Lives with:: Self Patient language and need for interpreter reviewed:: Yes Do you feel safe going back to the place where you live?: No   needs short term rehab  Need for Family Participation in Patient Care: Yes (Comment) Care giver support system in place?: Yes (comment)   Criminal Activity/Legal Involvement Pertinent to Current Situation/Hospitalization: No - Comment as needed  Activities of Daily Living Home Assistive Devices/Equipment: Shower chair with back ADL Screening (condition at time of admission) Patient's cognitive ability adequate to safely complete daily activities?: Yes Is the  patient deaf or have difficulty hearing?: No Does the patient have difficulty seeing, even when wearing glasses/contacts?: No Does the patient have difficulty concentrating, remembering, or making decisions?: No Patient able to express need for assistance with ADLs?: Yes Does the patient have difficulty dressing or bathing?: Yes Independently performs ADLs?: No Does the patient have difficulty walking or climbing stairs?: Yes Weakness of Legs: Both Weakness of Arms/Hands: Both  Permission Sought/Granted Permission sought to share information with : Case Manager, Customer service manager, Family Supports Permission granted to share information with : Yes, Verbal Permission Granted  Share Information with NAME: Juluis Rainier  Permission granted to share info w AGENCY: SNFs  Permission granted to share info w Relationship: daughter  Permission granted to share info w Contact Information: [phone]  Emotional Assessment Appearance:: Other (Comment Required(unable to assess) Attitude/Demeanor/Rapport: Gracious Affect (typically observed): Calm Orientation: : Oriented to Self, Oriented to Place, Oriented to  Time, Oriented to Situation Alcohol / Substance Use: Not Applicable Psych Involvement: No (comment)  Admission diagnosis:  Pneumonia due to COVID-19 virus [U07.1, J12.82] COVID-19 [U07.1] Patient Active Problem List   Diagnosis Date Noted  . Pneumonia due to COVID-19 virus 01/13/2020  . Acute respiratory failure ([place]) 01/13/2020  . Elevated troponin 01/13/2020  . PVD (peripheral vascular disease) ([place]) 01/13/2020  . Spinal stenosis of lumbar region 01/05/2020  . Acute foot pain, left 01/05/2020  . Lower limb ischemia 01/05/2020  . Visit for  routine gyn exam 07/15/2019  . STD exposure 07/15/2019  . Uterine fibroid 07/15/2019  . Hypertensive heart and renal disease   . Acute on chronic diastolic CHF (congestive heart failure) ([place]) 11/30/2016  . CKD (chronic kidney disease)  stage 3, GFR 30-59 ml/min 11/30/2016  . Thrombocytopenia ([place]) 11/30/2016  . Mixed incontinence 04/14/2015  . Asthma in adult without complication [id number]  . Primary open angle glaucoma of both eyes, severe stage 10/10/2014  . Arthritis of knee 01/05/2012  . PMB (postmenopausal bleeding) 05/09/2010  . ANEMIA 05/05/2010  . DEPRESSION 04/26/2010  . TMJ SYNDROME 04/26/2010  . DEGENERATIVE DISC DISEASE, CERVICAL SPINE 04/26/2010  . CHRONIC OBSTRUCTIVE PULMONARY DISEASE, MODERATE 03/26/2010  . Chronic obstructive pulmonary disease, unspecified ([place]) 03/26/2010  . MASTALGIA 10/08/2009  . Iron deficiency anemia 07/12/2009  . Uncontrolled type 2 diabetes mellitus with stage 3 chronic kidney disease, with long-term current use of insulin ([place]) 07/02/2009  . Hyperlipidemia 07/02/2009  . BMI 45.0-49.9, adult ([place]) 07/02/2009  . Obstructive sleep apnea on CPAP 07/02/2009  . Glaucoma 07/02/2009  . Essential hypertension 07/02/2009  . ASTHMA 07/02/2009  . LOW BACK PAIN SYNDROME 07/02/2009  . BACK PAIN 07/02/2009  . PERIPHERAL EDEMA 07/02/2009  . TOBACCO USE, QUIT 07/02/2009  . HEMORRHOIDS, INTERNAL 07/16/2008  . DIVERTICULOSIS OF COLON 07/16/2008   PCP:  Javier Docker, MD Pharmacy:   [facility] [place], [place] - [address] [place] [address] Phone: [phone] Fax: [phone]  [place], [place] [address] [place] [address] Phone: [phone] Fax: [phone]     Social Determinants of Health (SDOH) Interventions    Readmission Risk Interventions Readmission Risk Prevention Plan 01/09/2020  Transportation Screening Complete  PCP or Specialist Appt within 3-5 Days Complete  HRI or Home Care Consult Complete  Social Work Consult for [place] Planning/Counseling Complete  Palliative Care Screening Not Applicable  Medication Review Press photographer) Complete   Some recent data might be hidden

## 2020-01-17 DEATH — deceased

## 2020-01-18 LAB — GLUCOSE, CAPILLARY
Glucose-Capillary: 162 mg/dL — ABNORMAL HIGH (ref 70–99)
Glucose-Capillary: 166 mg/dL — ABNORMAL HIGH (ref 70–99)
Glucose-Capillary: 163 mg/dL — ABNORMAL HIGH (ref 70–99)
Glucose-Capillary: 134 mg/dL — ABNORMAL HIGH (ref 70–99)

## 2020-01-18 LAB — COMPREHENSIVE METABOLIC PANEL
ALT: 38 U/L (ref 0–44)
AST: 61 U/L — ABNORMAL HIGH (ref 15–41)
Albumin: 2.6 g/dL — ABNORMAL LOW (ref 3.5–5.0)
Alkaline Phosphatase: 73 U/L (ref 38–126)
Anion gap: 13 (ref 5–15)
BUN: 101 mg/dL — ABNORMAL HIGH (ref 8–23)
CO2: 18 mmol/L — ABNORMAL LOW (ref 22–32)
Calcium: 8.7 mg/dL — ABNORMAL LOW (ref 8.9–10.3)
Chloride: 107 mmol/L (ref 98–111)
Creatinine, Ser: 2.98 mg/dL — ABNORMAL HIGH (ref 0.44–1.00)
GFR calc Af Amer: 19 mL/min — ABNORMAL LOW (ref >60)
GFR calc non Af Amer: 16 mL/min — ABNORMAL LOW (ref >60)
Glucose, Bld: 178 mg/dL — ABNORMAL HIGH (ref 70–99)
Potassium: 4.8 mmol/L (ref 3.5–5.1)
Sodium: 138 mmol/L (ref 135–145)
Total Bilirubin: 0.8 mg/dL (ref 0.3–1.2)
Total Protein: 6.2 g/dL — ABNORMAL LOW (ref 6.5–8.1)

## 2020-01-18 LAB — CBC WITH DIFFERENTIAL/PLATELET
Abs Immature Granulocytes: 0.62 10*3/uL — ABNORMAL HIGH (ref 0.00–0.07)
Basophils Absolute: 0 10*3/uL (ref 0.0–0.1)
Basophils Relative: 0 %
Eosinophils Absolute: 0 10*3/uL (ref 0.0–0.5)
Eosinophils Relative: 0 %
HCT: 34.2 % — ABNORMAL LOW (ref 36.0–46.0)
Hemoglobin: 11.3 g/dL — ABNORMAL LOW (ref 12.0–15.0)
Immature Granulocytes: 3 %
Lymphocytes Relative: 7 %
Lymphs Abs: 1.5 10*3/uL (ref 0.7–4.0)
MCH: 30.1 pg (ref 26.0–34.0)
MCHC: 33 g/dL (ref 30.0–36.0)
MCV: 91.2 fL (ref 80.0–100.0)
Monocytes Absolute: 1.3 10*3/uL — ABNORMAL HIGH (ref 0.1–1.0)
Monocytes Relative: 6 %
Neutro Abs: 17 10*3/uL — ABNORMAL HIGH (ref 1.7–7.7)
Neutrophils Relative %: 84 %
Platelets: 336 10*3/uL (ref 150–400)
RBC: 3.75 MIL/uL — ABNORMAL LOW (ref 3.87–5.11)
RDW: 16.9 % — ABNORMAL HIGH (ref 11.5–15.5)
WBC: 20.4 10*3/uL — ABNORMAL HIGH (ref 4.0–10.5)
nRBC: 0.6 % — ABNORMAL HIGH (ref 0.0–0.2)

## 2020-01-18 LAB — FERRITIN: Ferritin: 1823 ng/mL — ABNORMAL HIGH (ref 11–307)

## 2020-01-18 LAB — CULTURE, BLOOD (ROUTINE X 2): Culture: NO GROWTH

## 2020-01-18 LAB — D-DIMER, QUANTITATIVE: D-Dimer, Quant: 4.13 ug/mL-FEU — ABNORMAL HIGH (ref 0.00–0.50)

## 2020-01-18 LAB — C-REACTIVE PROTEIN: CRP: 5.1 mg/dL — ABNORMAL HIGH (ref <1.0)

## 2020-01-18 LAB — MAGNESIUM: Magnesium: 2.4 mg/dL (ref 1.7–2.4)

## 2020-01-18 MED ORDER — SODIUM BICARBONATE 650 MG PO TABS
1300.0000 mg | ORAL_TABLET | Freq: Two times a day (BID) | ORAL | Status: DC
  Administered 2020-01-18 – 2020-01-27 (×18): 1300 mg via ORAL
  Filled 2020-01-18 (×18): qty 2

## 2020-01-18 NOTE — Progress Notes (Signed)
PROGRESS NOTE                                                                                                                                                                                                             Patient Demographics:    [name], is a 64 y.o. female, DOB - 21-Dec-1955, [id number]  Outpatient Primary MD for the patient is Pavelock, Ralene Bathe, MD   Admit date - 01/06/2020   LOS - 5  Chief Complaint  Patient presents with  . Shortness of Breath  . Chest Pain       Brief Narrative: Patient is a 64 y.o. female  PMHx of hypertension, HLD, DM-2, CKD stage IV, PVD s/p stent to left distal SFA on [id number], chronic diastolic heart failure-presented with shortness of breath or cough-found to have acute hypoxic respiratory failure secondary to COVID-19 pneumonia and decompensated diastolic heart failure.  Significant Events: 4/18-4/23>> admit to [facility]-s/p stent to left distal SFA 4/27>> admit to [facility] for hypoxia from Covid/heart failure 4/27>> lower extremity Dopplers negative for DVT. [id number] grade 2 diastolic dysfunction, moderate RV systolic dysfunction.  RVSP 62.6 mmHg. 4/30>> VQ scan-low probability PE  COVID-19 medications: Steroids: 4/27>> Remdesivir: 4/27>> 5/1  Antibiotics: Vancomycin: 4/27 x 1 Cefepime: 4/27 x 1  Microbiology data: 4/27: Blood culture>> no growth 4/28: Blood culture>> no growth  DVT prophylaxis: SQ heparin  Procedures: None  Consults: None    Subjective:   Looks better-feels somewhat better-less lethargic compared to yesterday. Sitting at bedside chair.   Assessment  & Plan :   Acute Hypoxic Resp Failure due to Covid 19 Viral pneumonia and decompensated diastolic heart failure: Continues to remain tenuous but stable on 7 L of oxygen this morning. Some mild volume overload present but better than the past few days. VQ scan without PE. Lower extremity Dopplers  negative. Continued attempts to titrate down FiO2-continue supportive care.  COVID-19 pneumonia: Unchanged compared to yesterday-on 7 L-completed a course of remdesivir, s/p Actemra on 4/30-remains on steroids. Continued attempts to slowly titrate down FiO2. Follow clinical trajectory and inflammatory markers. .   Fever: afebrile  O2 requirements:  SpO2: 97 % O2 Flow Rate (L/min): 7 L/min   COVID-19 Labs: Recent Labs    01/16/20 0632 01/17/20 0831 01/18/20 0702  DDIMER 3.05* 3.71* 4.13*  FERRITIN 1,544* 1,538* 1,823*  CRP 9.3* 7.0* 5.1*       Component Value Date/Time   BNP 134.9 (H) 01/17/2020 0831    Recent Labs  Lab 01/13/20 2200  PROCALCITON 0.21    Lab Results  Component Value Date   SARSCOV2NAA POSITIVE (A) 01/13/2020   [place] NEGATIVE 01/05/2020   [place] Not Detected 11/24/2019     Prone/Incentive Spirometry: encouraged  incentive spirometry use 3-4/hour.  Decompensated diastolic heart failure: Volume status improved compared to admission-less edema in her lower extremities-but still very hypoxic-due to worsening renal function-diuretics on hold.  Suspect worsening hypoxemia is likely secondary to COVID-19-see above.  Continue to follow weights, electrolytes and volume status closely.  Pulmonary hypertension/RV dysfunction: Not seen in prior echo's-I suspect this is probably from OSA-she has been noncompliant to CPAP, but given her worsening hypoxemia-she underwent a VQ scan-which was low probability for PE.  Lower extremity Dopplers negative.  Briefly on IV heparin-but have switched back to prophylactic dosing.   AKI on CKD stage IV: Urine has improved somewhat today-AKI likely hemodynamically mediated.  Renal ultrasound without any hydronephrosis.  Nephrology following.    Acute metabolic encephalopathy: Less lethargic this morning-a bit more fluent and answers. Improving-etiology felt to be secondary to hypoxemia and AKI.  Elevated troponin: Trend  is flat-not consistent with ACS-likely secondary to underlying CKD-with some component from demand ischemia due to COVID-19/heart failure.  PAD-s/p stent to left SFA on 4/22: Both lower extremities appear warm-continue aspirin, Plavix and statin.  HTN: BP stable-continue Cardura-Lasix and lisinopril on hold.    DM-2 with hyperglycemia (A1c 12.2 on 4/19): CBGs relatively stable-continue Lantus 26 units twice daily-SSI.  Follow and optimize.  Recent Labs    01/17/20 2109 01/18/20 0804 01/18/20 1206  GLUCAP 157* 162* 166*    Thrombocytopenia: Resolved.  Anemia of chronic disease: New baseline-likely related to CKD.  Gout: No flare-hold colchicine for now given AKI.  OSA: Noncompliant to CPAP-continue CPAP nightly if possible in the hospital.  Obesity: Estimated body mass index is 45.74 kg/m as calculated from the following:   Height as of this encounter: 5\' 1"  (1.549 m).   Weight as of this encounter: 109.8 kg.   ABG:    Component Value Date/Time   PHART 7.460 (H) 09/30/2008 1120   PCO2ART 41.0 09/30/2008 1120   PO2ART 83.0 09/30/2008 1120   HCO3 26.4 12/06/2016 0922   HCO3 26.1 12/06/2016 0922   TCO2 28 12/06/2016 0922   TCO2 28 12/06/2016 0922   O2SAT 69.0 12/06/2016 0922   O2SAT 67.0 12/06/2016 0922    Vent Settings: N/A  Condition - Extremely Guarded  Family Communication  : Left voicemail for daughter on 5/2  Code Status :  Full Code  Diet :  Diet Order            Diet heart healthy/carb modified Room service appropriate? Yes; Fluid consistency: Thin; Fluid restriction: 2000 mL Fluid  Diet effective now               Disposition Plan  :   Status is: Inpatient  Remains inpatient appropriate because:Inpatient level of care appropriate due to severity of illness  Dispo: The patient is from: Home              Anticipated d/c is to: Home              Anticipated d/c date is: > 3 days              Patient currently is not  medically stable to  d/c.  Barriers to discharge: Hypoxia requiring O2 supplementation/ AKI  Antimicorbials  :    Anti-infectives (From admission, onward)   Start     Dose/Rate Route Frequency Ordered Stop   01/15/20 1200  vancomycin (VANCOREADY) IVPB 1250 mg/250 mL  Status:  Discontinued     1,250 mg 166.7 mL/hr over 90 Minutes Intravenous Every 48 hours 01/13/20 1018 01/14/20 1038   01/14/20 1000  remdesivir 100 mg in sodium chloride 0.9 % 100 mL IVPB     100 mg 200 mL/hr over 30 Minutes Intravenous Daily 01/13/20 1129 01/17/20 1113   01/13/20 1630  valACYclovir (VALTREX) tablet 1,000 mg  Status:  Discontinued     1,000 mg Oral 3 times daily 01/13/20 1557 01/17/20 1147   01/13/20 1230  remdesivir 200 mg in sodium chloride 0.9% 250 mL IVPB     200 mg 580 mL/hr over 30 Minutes Intravenous Once 01/13/20 1129 01/13/20 1800   01/13/20 1015  ceFEPIme (MAXIPIME) 2 g in sodium chloride 0.9 % 100 mL IVPB     2 g 200 mL/hr over 30 Minutes Intravenous  Once 01/13/20 1003 01/13/20 1434   01/13/20 1015  vancomycin (VANCOREADY) IVPB 2000 mg/400 mL     2,000 mg 200 mL/hr over 120 Minutes Intravenous  Once 01/13/20 1012 01/13/20 2258      Inpatient Medications  Scheduled Meds: . vitamin C  500 mg Oral Daily  . aspirin EC  81 mg Oral Q breakfast  . atorvastatin  40 mg Oral Daily  . brimonidine  1 drop Both Eyes BID  . clopidogrel  75 mg Oral Daily  . dorzolamide-timolol  1 drop Both Eyes BID  . doxazosin  4 mg Oral QHS  . heparin injection (subcutaneous)  5,000 Units Subcutaneous Q8H  . insulin aspart  0-20 Units Subcutaneous TID WC  . insulin aspart  0-5 Units Subcutaneous QHS  . insulin glargine  26 Units Subcutaneous BID  . Ipratropium-Albuterol  1 puff Inhalation Q6H  . latanoprost  1 drop Both Eyes QHS  . linagliptin  5 mg Oral Daily  . methylPREDNISolone (SOLU-MEDROL) injection  40 mg Intravenous Q12H  . pantoprazole  40 mg Oral Q1200  . prednisoLONE acetate  1 drop Right Eye BID  . sodium  bicarbonate  1,300 mg Oral BID  . sodium chloride flush  3 mL Intravenous Once  . zinc sulfate  220 mg Oral Daily   Continuous Infusions:  PRN Meds:.acetaminophen, chlorpheniramine-HYDROcodone, guaiFENesin-dextromethorphan, ondansetron **OR** ondansetron (ZOFRAN) IV, oxyCODONE-acetaminophen   Time Spent in minutes  25  See all Orders from today for further details   Oren Binet M.D on 01/18/2020 at 2:24 PM  To page go to [email or web address] - use universal password  [facility] -  Office  [phone]    Objective:   Vitals:   01/17/20 2000 01/17/20 2158 01/18/20 0000 01/18/20 0400  BP: (!) 127/55 (!) 137/58 (!) 159/81 (!) 131/58  Pulse: 72 73 62 62  Resp: (!) 22  19 19   Temp: 97.7 F (36.5 C)  97.8 F (36.6 C) 98.2 F (36.8 C)  TempSrc: Axillary  Axillary Axillary  SpO2: 92%  96% 97%  Weight:      Height:        Wt Readings from Last 3 Encounters:  01/16/20 109.8 kg  01/08/20 115.6 kg  07/15/19 102.9 kg     Intake/Output Summary (Last 24 hours) at 01/18/2020 1424 Last data filed at 01/18/2020 0541 Gross per 24 hour  Intake 240 ml  Output 400 ml  Net -160 ml    Physical Exam Gen Exam:Alert awake-not in any distress HEENT:atraumatic, normocephalic Chest: B/L clear to auscultation anteriorly CVS:S1S2 regular Abdomen:soft non tender, non distended Extremities:+ edema Neurology: Non focal Skin: no rash   Data Review:    CBC Recent Labs  Lab 01/14/20 0307 01/15/20 0528 01/16/20 0632 01/17/20 0831 01/18/20 0702  WBC 5.1 9.1 14.7* 16.8* 20.4*  HGB 10.0* 10.1* 10.0* 10.8* 11.3*  HCT 31.4* 31.2* 30.9* 32.7* 34.2*  PLT 143* 208 273 323 336  MCV 94.6 92.0 92.2 90.1 91.2  MCH 30.1 29.8 29.9 29.8 30.1  MCHC 31.8 32.4 32.4 33.0 33.0  RDW 17.1* 16.9* 16.9* 16.7* 16.9*  LYMPHSABS 0.5* 1.0 1.3 1.4 1.5  MONOABS 0.2 0.4 0.8 1.1* 1.3*  EOSABS 0.0 0.0 0.0 0.0 0.0  BASOSABS 0.0 0.0 0.0 0.0 0.0    Chemistries  Recent Labs  Lab 01/14/20 0307  01/15/20 0528 01/16/20 0632 01/17/20 0831 01/18/20 0702  NA 140 140 140 140 138  K 4.5 4.7 4.7 4.5 4.8  CL 109 109 109 109 107  CO2 19* 19* 20* 19* 18*  GLUCOSE 196* 253* 217* 159* 178*  BUN 44* 63* 81* 94* 101*  CREATININE 2.08* 2.48* 2.96* 3.11* 2.98*  CALCIUM 8.1* 8.2* 8.3* 8.6* 8.7*  MG 1.6* 2.0 2.1 2.4 2.4  AST 62* 57* 60* 69* 61*  ALT 34 31 32 43 38  ALKPHOS 38 47 59 71 73  BILITOT 0.8 0.6 0.9 0.9 0.8   ------------------------------------------------------------------------------------------------------------------ No results for input(s): CHOL, HDL, LDLCALC, TRIG, CHOLHDL, LDLDIRECT in the last 72 hours.  Lab Results  Component Value Date   HGBA1C 12.2 (H) 01/05/2020   ------------------------------------------------------------------------------------------------------------------ No results for input(s): TSH, T4TOTAL, T3FREE, THYROIDAB in the last 72 hours.  Invalid input(s): FREET3 ------------------------------------------------------------------------------------------------------------------ Recent Labs    01/17/20 0831 01/18/20 0702  FERRITIN 1,538* 1,823*    Coagulation profile No results for input(s): INR, PROTIME in the last 168 hours.  Recent Labs    01/17/20 0831 01/18/20 0702  DDIMER 3.71* 4.13*    Cardiac Enzymes No results for input(s): CKMB, TROPONINI, MYOGLOBIN in the last 168 hours.  Invalid input(s): CK ------------------------------------------------------------------------------------------------------------------    Component Value Date/Time   BNP 134.9 (H) 01/17/2020 0831    Micro Results Recent Results (from the past 240 hour(s))  Respiratory Panel by RT PCR (Flu A&B, Covid) - Nasopharyngeal Swab     Status: Abnormal   Collection Time: 01/13/20 10:25 AM   Specimen: Nasopharyngeal Swab  Result Value Ref Range Status   SARS Coronavirus 2 by RT PCR POSITIVE (A) NEGATIVE Final    Comment: RESULT CALLED TO, READ BACK BY AND  VERIFIED WITH: Sheran Lawless RN 12:25 01/13/20 ([email or web address]) (NOTE) SARS-CoV-2 target nucleic acids are DETECTED. SARS-CoV-2 RNA is generally detectable in upper respiratory specimens  during the acute phase of infection. Positive results are indicative of the presence of the identified virus, but do not rule out bacterial infection or co-infection with other pathogens not detected by the test. Clinical correlation with patient history and other diagnostic information is necessary to determine patient infection status. The expected result is Negative. Fact Sheet for Patients:  PinkCheek.be Fact Sheet for Healthcare Providers: GravelBags.it This test is not yet approved or cleared by the [place] FDA and  has been authorized for detection and/or diagnosis of SARS-CoV-2 by FDA under an Emergency Use Authorization (EUA).  This EUA will remain in effect (meaning this test can be  used)  for the duration of  the COVID-19 declaration under Section 564(b)(1) of the Act, 21 U.S.C. section 360bbb-3(b)(1), unless the authorization is terminated or revoked sooner.    Influenza A by PCR NEGATIVE NEGATIVE Final   Influenza B by PCR NEGATIVE NEGATIVE Final    Comment: (NOTE) The Xpert Xpress SARS-CoV-2/FLU/RSV assay is intended as an aid in  the diagnosis of influenza from Nasopharyngeal swab specimens and  should not be used as a sole basis for treatment. Nasal washings and  aspirates are unacceptable for Xpert Xpress SARS-CoV-2/FLU/RSV  testing. Fact Sheet for Patients: PinkCheek.be Fact Sheet for Healthcare Providers: GravelBags.it This test is not yet approved or cleared by the [place] FDA and  has been authorized for detection and/or diagnosis of SARS-CoV-2 by  FDA under an Emergency Use Authorization (EUA). This EUA will remain  in effect (meaning this test can be used) for  the duration of the  Covid-19 declaration under Section 564(b)(1) of the Act, 21  U.S.C. section 360bbb-3(b)(1), unless the authorization is  terminated or revoked. Performed at [facility], [place] [address] [place], [place] [address]   Blood culture (routine x 2)     Status: None   Collection Time: 01/13/20 11:10 AM   Specimen: BLOOD  Result Value Ref Range Status   Specimen Description BLOOD LEFT ANTECUBITAL  Final   Special Requests   Final    BOTTLES DRAWN AEROBIC AND ANAEROBIC Blood Culture results may not be optimal due to an inadequate volume of blood received in culture bottles   Culture   Final    NO GROWTH 5 DAYS Performed at [facility], [place] [address] [place], [place] [address]    Report Status 01/18/2020 FINAL  Final  Blood culture (routine x 2)     Status: None (Preliminary result)   Collection Time: 01/14/20  8:51 AM   Specimen: BLOOD LEFT HAND  Result Value Ref Range Status   Specimen Description BLOOD LEFT HAND  Final   Special Requests   Final    BOTTLES DRAWN AEROBIC AND ANAEROBIC Blood Culture results may not be optimal due to an inadequate volume of blood received in culture bottles   Culture   Final    NO GROWTH 4 DAYS Performed at [facility], [place] [address], [place], [place] [address]    Report Status PENDING  Incomplete    Radiology Reports DG Chest 2 View  Result Date: 01/13/2020 CLINICAL DATA:  Shortness of breath and bilateral leg swelling. EXAM: CHEST - 2 VIEW COMPARISON:  November 30, 2016 FINDINGS: Moderate severity diffuse multifocal infiltrates are seen, bilaterally. There is no evidence of a pleural effusion or pneumothorax. The cardiac silhouette is mildly enlarged. A radiopaque fusion plate and screws are seen overlying the lower cervical spine. The visualized skeletal structures are otherwise unremarkable. Radiopaque surgical clips are seen within the right upper quadrant. IMPRESSION: Moderate severity bilateral multifocal  infiltrates. Electronically Signed   By: Virgina Norfolk M.D.   On: 01/13/2020 00:27   NM Pulmonary Perfusion  Result Date: 01/16/2020 CLINICAL DATA:  Shortness of breath, history of COVID-19 positivity EXAM: NUCLEAR MEDICINE PERFUSION LUNG SCAN TECHNIQUE: Perfusion images were obtained in multiple projections after intravenous injection of radiopharmaceutical. Ventilation scans intentionally deferred if perfusion scan and chest x-ray adequate for interpretation during COVID 19 epidemic. RADIOPHARMACEUTICALS:  1.6 mCi Tc-1m MAA IV COMPARISON:  Chest x-ray from earlier in the same day. FINDINGS: Perfusion images demonstrate adequate uptake throughout  both lungs despite the changes seen on recent chest x-ray. No focal defect to suggest pulmonary embolism is noted. IMPRESSION: No evidence of pulmonary embolism. Electronically Signed   By: Inez Catalina M.D.   On: 01/16/2020 16:23   US RENAL  Result Date: 01/16/2020 CLINICAL DATA:  Acute renal insufficiency. EXAM: RENAL / URINARY TRACT ULTRASOUND COMPLETE COMPARISON:  None. FINDINGS: Exam difficult due to patient body habitus. Right Kidney: Renal measurements: 9.4 x 4.2 x 4.5 cm = volume: 94 mL . Echogenicity within normal limits. Mild cortical thinning. No mass or hydronephrosis visualized. Left Kidney: Renal measurements: 10.1 x 5.2 x 5.5 cm = volume: 150 mL. Echogenicity within normal limits. Mild cortical thinning. No mass or hydronephrosis visualized. Bladder: Not visualized. Other: None. IMPRESSION: Normal size kidneys without hydronephrosis. Electronically Signed   By: Marin Olp M.D.   On: 01/16/2020 16:38   US RENAL  Result Date: 01/06/2020 CLINICAL DATA:  Chronic kidney disease EXAM: RENAL / URINARY TRACT ULTRASOUND COMPLETE COMPARISON:  None. FINDINGS: Right Kidney: Renal measurements: 9.3 x 4.3 x 4.6 cm = volume: 96 mL. Lobulated contour. No hydronephrosis. Normal echogenicity. Left Kidney: Renal measurements: 10.7 x 5.0 x 5.2 cm = volume:  145 mL. Lobulated contour without hydronephrosis. Normal echogenicity. Bladder: Decompressed. Other: Visualization is generally limited by body habitus. IMPRESSION: No hydronephrosis. Lobulated renal contours without focal parenchymal abnormality. Electronically Signed   By: Ulyses Jarred M.D.   On: 01/06/2020 16:16   PERIPHERAL VASCULAR CATHETERIZATION  Result Date: 01/08/2020 Patient name: Estephany Pant   MRN: [id number]        DOB: [birth year]            Sex: female  01/08/2020 Pre-operative Diagnosis: Critical limb ischemia of the left lower extremity with tissue loss Post-operative diagnosis:  Same Surgeon:  Marty Heck, MD Procedure Performed: 1.  Ultrasound guided access of the right common femoral artery 2.  Aortogram with CO2 including catheter selection of the aorta 3.  Left lower extremity arteriogram with selection of third order branches 4.  Left distal SFA above-knee popliteal artery angioplasty with stent placement (primarily stented with a 6 mm x 60 mm drug-coated Eluvia postdilated with a 5 mm Mustang) 5.  Left anterior tibial angioplasty for focal perforation with resolution of the perforation (3 mm Sterling) 6.  Mynx closure of the right common femoral artery 7.  55 minutes of monitored moderate conscious sedation time   Indications: Patient is a 64 year old female who was seen in consultation by Dr. Trula Slade for left lower extremity rest pain with severely depressed ABIs and duplex showing likely popliteal disease.  She presents today for left lower extremity arteriogram after risk benefits discussed.  Findings:  Aortogram was performed with CO2 to limit contrast and there was no flow-limiting aortoiliac stenosis.  Left lower extremity arteriogram showed a patent common femoral profunda and widely patent SFA in the proximal to mid segment.  At [facility] canal in the distal SFA and above-knee popliteal artery showed a very short focal occlusion with large collateral around this.  She  had a patent below-knee popliteal artery with three-vessel runoff.  Anterior tibial appeared to be dominant runoff.   Ultimately this lesion was crossed in the distal SFA/above knee popliteal artery and primarily stented with a 6 mm x 60 mm Eluvia.  She now has excellent inline flow down the left lower extremity with no residual stenosis and three-vessel runoff.  There was a focal perforation in the anterior tibial where our wire likely  made a perforation during exchange of balloons and stents.  Ultimately this was treated with a very low inflation of a 3 mm Sterling for 3 minutes with resolution.  Patient has preserved three vessel runoff.              Procedure:  The patient was identified in the holding area and taken to room 8.  The patient was then placed supine on the table and prepped and draped in the usual sterile fashion.  A time out was called.  Ultrasound was used to evaluate the right common femoral artery.  It was patent .  A digital ultrasound image was acquired.  A micropuncture needle was used to access the right common femoral artery under ultrasound guidance.  An 018 wire was advanced without resistance and a micropuncture sheath was placed.  The 018 wire was removed and a benson wire was placed.  The micropuncture sheath was exchanged for a 5 french sheath.  An omniflush catheter was advanced over the wire to the level of L-1.  An abdominal angiogram was obtained.  Next, using glidewire and straight 4 French flush catheter the aortic bifurcation was cross because the Omni would not track.  Placed the catheter into theleft external iliac artery and left runoff was obtained.  Ultimately after evaluating the pictures elected to intervene on the distal left SFA above-knee popliteal occlusion.  Used a long Glidewire advantage down the SFA and exchanged for a 6 [place] Ansell sheath in the right groin over the aortic bifurcation.  Patient was given 100 units per kilogram heparin.  I then primarily  stented this lesion with a 6 mm x 60 mm Eluvia that was postdilated with a 5 mm x 60 mm Mustang.  Final pictures showed no residual stenosis with excellent inline flow down the left lower extremity and three-vessel runoff.  There was a focal perforation in the proximal anterior tibial that I think was from our wire.  I then exchanged for a V 18 wire down the anterior tibial and then did a very slow inflation with a 3 mm Sterling in the left anterior tibial artery for 3 minutes.  There appeared to be resolution of the perforation after taking multiple additional pictures.  Wires and catheters removed and a short 6 French sheath was placed in the right groin.  We placed a mynx closure device.   Plan: Patient will be loaded on Plavix and will need to continue aspirin and Plavix.  Should have inline flow down left leg now.   Marty Heck, MD [facility] Office: [phone]   DG Chest Port 1V same Day  Result Date: 01/16/2020 CLINICAL DATA:  Shortness of breath.  COVID. EXAM: PORTABLE CHEST 1 VIEW COMPARISON:  Three days ago FINDINGS: More confluent bilateral interstitial and airspace opacity. Cardiomegaly. No visible effusion or pneumothorax. IMPRESSION: Worsening bilateral pneumonia. Electronically Signed   By: Monte Fantasia M.D.   On: 01/16/2020 08:34   DG Foot Complete Left  Result Date: 01/05/2020 CLINICAL DATA:  Swelling EXAM: LEFT FOOT - COMPLETE 3+ VIEW COMPARISON:  None. FINDINGS: Degenerative changes in the 1st MTP joint. No acute bony abnormality. Specifically, no fracture, subluxation, or dislocation. Vascular calcifications noted. No bone destruction seen to suggest osteomyelitis. IMPRESSION: No acute bony abnormality. Electronically Signed   By: Rolm Baptise M.D.   On: 01/05/2020 01:25   VAS [place] ABI WITH/WO TBI  Result Date: 01/05/2020 LOWER EXTREMITY DOPPLER STUDY Indications: Blue, cold toes. High  Risk Factors: Hypertension, Diabetes.  Limitations:  Today's exam was limited due to Patient body habitus. Comparison Study: No prior studies. Performing Technologist: Carlos Levering Rvt  Examination Guidelines: A complete evaluation includes at minimum, Doppler waveform signals and systolic blood pressure reading at the level of bilateral brachial, anterior tibial, and posterior tibial arteries, when vessel segments are accessible. Bilateral testing is considered an integral part of a complete examination. Photoelectric Plethysmograph (PPG) waveforms and toe systolic pressure readings are included as required and additional duplex testing as needed. Limited examinations for reoccurring indications may be performed as noted.  ABI Findings: +---------+------------------+-----+---------+--------+ Right    Rt Pressure (mmHg)IndexWaveform Comment  +---------+------------------+-----+---------+--------+ Brachial 150                    triphasic         +---------+------------------+-----+---------+--------+ PTA      108               0.72 biphasic          +---------+------------------+-----+---------+--------+ DP       137               0.91 triphasic         +---------+------------------+-----+---------+--------+ [place]76                0.51                   +---------+------------------+-----+---------+--------+ +---------+------------------+-----+----------+-------+ Left     Lt Pressure (mmHg)IndexWaveform  Comment +---------+------------------+-----+----------+-------+ Brachial 136                    triphasic         +---------+------------------+-----+----------+-------+ PTA      66                0.44 monophasic        +---------+------------------+-----+----------+-------+ DP       53                0.35 monophasic        +---------+------------------+-----+----------+-------+ Great Toe0                 0.00                   +---------+------------------+-----+----------+-------+  +-------+-----------+-----------+------------+------------+ ABI/TBIToday's ABIToday's TBIPrevious ABIPrevious TBI +-------+-----------+-----------+------------+------------+ Right  0.91       0.51                                +-------+-----------+-----------+------------+------------+ Left   0.44       0                                   +-------+-----------+-----------+------------+------------+  Summary: Right: Resting right ankle-brachial index indicates mild right lower extremity arterial disease. The right toe-brachial index is abnormal. Left: Resting left ankle-brachial index indicates severe left lower extremity arterial disease. The left toe-brachial index is abnormal.  *See table(s) above for measurements and observations.  Electronically signed by [name] MD on 01/05/2020 at 7:35:40 PM.    Final    VAS [place] LOWER EXTREMITY ARTERIAL DUPLEX  Result Date: 01/05/2020 LOWER EXTREMITY ARTERIAL DUPLEX STUDY Indications: Blue, cold toes. High Risk Factors: Hypertension, Diabetes.  Current ABI: R 0.91 L 0.44 Limitations: Patient body habitus, poor ultrasound/tissue interface. Comparison Study: No prior studies. Performing Technologist:  Oliver Hum RVT  Examination Guidelines: A complete evaluation includes B-mode imaging, spectral Doppler, color Doppler, and power Doppler as needed of all accessible portions of each vessel. Bilateral testing is considered an integral part of a complete examination. Limited examinations for reoccurring indications may be performed as noted.  +-----------+--------+-----+--------+----------+------------------+ LEFT       PSV cm/sRatioStenosisWaveform  Comments           +-----------+--------+-----+--------+----------+------------------+ EIA Distal 116                  biphasic                     +-----------+--------+-----+--------+----------+------------------+ CFA Mid    132                  biphasic                      +-----------+--------+-----+--------+----------+------------------+ DFA        111                  biphasic                     +-----------+--------+-----+--------+----------+------------------+ SFA Prox   104                  biphasic                     +-----------+--------+-----+--------+----------+------------------+ SFA Mid    85                   biphasic                     +-----------+--------+-----+--------+----------+------------------+ SFA Distal 91                   biphasic                     +-----------+--------+-----+--------+----------+------------------+ POP Prox   26                   monophasic                   +-----------+--------+-----+--------+----------+------------------+ POP Distal 0                              Unable to insonate +-----------+--------+-----+--------+----------+------------------+ ATA Prox   73                   biphasic                     +-----------+--------+-----+--------+----------+------------------+ ATA Mid    33                   monophasic                   +-----------+--------+-----+--------+----------+------------------+ ATA Distal 32                   monophasic                   +-----------+--------+-----+--------+----------+------------------+ PTA Prox                                  Unable to insonate +-----------+--------+-----+--------+----------+------------------+ PTA Mid    27  monophasic                   +-----------+--------+-----+--------+----------+------------------+ PTA Distal 9                    monophasic                   +-----------+--------+-----+--------+----------+------------------+ PERO Prox                                 Unable to insonate +-----------+--------+-----+--------+----------+------------------+ PERO Mid                                  Unable to insonate  +-----------+--------+-----+--------+----------+------------------+ PERO Distal15                   monophasic                   +-----------+--------+-----+--------+----------+------------------+ DP         29                   monophasic                   +-----------+--------+-----+--------+----------+------------------+  Summary: Left: The external iliac, common femoral, and superficial femoral arteries appear patent with no significant stenosis. Monophasic waveforms are noted in the proximal popliteal artery with poor visualization of the distal segment. Which suggests a possible occlusion. Anterior tibial artery appears patent throughout with monophasic flow. The posterior tibial, and peroneal arteries demonstrated monophasic flow with likely areas of occlusive disease. The proximal calf arteries were difficult to visualize.  See table(s) above for measurements and observations. Electronically signed by [name] MD on 01/05/2020 at 27:34:41 PM.    Final    VAS [place] LOWER EXTREMITY VENOUS (DVT)  Result Date: 01/13/2020  Lower Venous DVTStudy Indications: Swelling.  Limitations: Body habitus and poor ultrasound/tissue interface. Performing Technologist: Antonieta Pert RDMS, RVT  Examination Guidelines: A complete evaluation includes B-mode imaging, spectral Doppler, color Doppler, and power Doppler as needed of all accessible portions of each vessel. Bilateral testing is considered an integral part of a complete examination. Limited examinations for reoccurring indications may be performed as noted. The reflux portion of the exam is performed with the patient in reverse Trendelenburg.  +---------+---------------+---------+-----------+----------+-------------------+ RIGHT    CompressibilityPhasicitySpontaneityPropertiesThrombus Aging      +---------+---------------+---------+-----------+----------+-------------------+ CFV      Partial        Yes      Yes                  patient  body                                                              habitus and                                                               position limiting  evaluation          +---------+---------------+---------+-----------+----------+-------------------+ SFJ      Full                                                             +---------+---------------+---------+-----------+----------+-------------------+ FV Prox                          Yes                                      +---------+---------------+---------+-----------+----------+-------------------+ FV Mid                           Yes                                      +---------+---------------+---------+-----------+----------+-------------------+ FV Distal                        Yes                                      +---------+---------------+---------+-----------+----------+-------------------+ PFV      Full                                                             +---------+---------------+---------+-----------+----------+-------------------+ POP      Full                                                             +---------+---------------+---------+-----------+----------+-------------------+ PTV                                                   not clearly                                                               visualized          +---------+---------------+---------+-----------+----------+-------------------+ PERO                                                  not clearly  visualized          +---------+---------------+---------+-----------+----------+-------------------+   +---------+---------------+---------+-----------+----------+------------------+ LEFT     CompressibilityPhasicitySpontaneityPropertiesThrombus  Aging     +---------+---------------+---------+-----------+----------+------------------+ CFV      Full           Yes      Yes                                     +---------+---------------+---------+-----------+----------+------------------+ SFJ      Full                                                            +---------+---------------+---------+-----------+----------+------------------+ FV Prox  Full                                                            +---------+---------------+---------+-----------+----------+------------------+ FV Mid   Full                                                            +---------+---------------+---------+-----------+----------+------------------+ FV Distal                        Yes                                     +---------+---------------+---------+-----------+----------+------------------+ PFV      Full                                                            +---------+---------------+---------+-----------+----------+------------------+ POP      Full           Yes      Yes                                     +---------+---------------+---------+-----------+----------+------------------+ PTV                                                   not clearly                                                              visualized         +---------+---------------+---------+-----------+----------+------------------+ PERO  not clearly                                                              visualized         +---------+---------------+---------+-----------+----------+------------------+    Summary: RIGHT: - There is no evidence of deep vein thrombosis in the lower extremity. However, portions of this examination were limited- see technologist comments above.  LEFT: - There is no evidence of deep vein thrombosis in the lower extremity.  However, portions of this examination were limited- see technologist comments above.  - Prominent lymph nodes noted bilateral groin areas.  *See table(s) above for measurements and observations. Electronically signed by Deitra Mayo MD on 01/13/2020 at 6:50:27 PM.    Final    ECHOCARDIOGRAM LIMITED  Result Date: 01/15/2020    ECHOCARDIOGRAM LIMITED REPORT   Patient Name:   MICHALA ORLOSKY Date of Exam: 01/15/2020 Medical Rec #:  [id number]     Height:       61.0 in Accession #:    [id number]    Weight:       249.3 lb Date of Birth:  February 27, 1956      BSA:          2.074 m Patient Age:    34 years      BP:           131/70 mmHg Patient Gender: F             HR:           53 bpm. Exam Location:  Inpatient Procedure: Limited Echo, Limited Color Doppler and Cardiac Doppler Indications:    Acute Respiratory Insufficiency R06.89  History:        Patient has prior history of Echocardiogram examinations, most                 recent 12/02/2016. Risk Factors:Hypertension and Dyslipidemia.  Sonographer:    Mikki Santee RDCS (AE) Referring Phys: [place]  1. Left ventricular diastolic parameters are consistent with Grade II diastolic dysfunction (pseudonormalization).  2. Right ventricular systolic function is moderately reduced. There is severely elevated pulmonary artery systolic pressure.  3. The inferior vena cava is normal in size with greater than 50% respiratory variability, suggesting right atrial pressure of 3 mmHg. FINDINGS  Left Ventricle: Indeterminate filling pressures. Right Ventricle: Right ventricular systolic function is moderately reduced. There is severely elevated pulmonary artery systolic pressure. The tricuspid regurgitant velocity is 3.86 m/s, and with an assumed right atrial pressure of 3 mmHg, the estimated right ventricular systolic pressure is [id number] mmHg. Tricuspid Valve: Tricuspid valve regurgitation is mild. Venous: The inferior vena cava is normal in size with  greater than 50% respiratory variability, suggesting right atrial pressure of 3 mmHg.  LEFT VENTRICLE PLAX 2D LVOT diam:     2.10 cm  Diastology LV SV:         81       LV e' lateral:   5.61 cm/s LV SV Index:   39       LV E/e' lateral: 12.1 LVOT Area:     3.46 cm LV e' medial:    6.40 cm/s  LV E/e' medial:  10.6  RIGHT VENTRICLE TAPSE (M-mode): 1.5 cm LEFT ATRIUM           Index       RIGHT ATRIUM           Index LA Vol (A4C): 37.4 ml 18.03 ml/m RA Area:     16.80 cm                                   RA Volume:   44.50 ml  21.45 ml/m  AORTIC VALVE LVOT Vmax:   98.60 cm/s LVOT Vmean:  69.800 cm/s LVOT VTI:    0.235 m MITRAL VALVE               TRICUSPID VALVE MV Area (PHT): 3.53 cm    TR Peak grad:   59.6 mmHg MV Decel Time: 215 msec    TR Vmax:        386.00 cm/s MV E velocity: 67.70 cm/s MV A velocity: 65.10 cm/s  SHUNTS MV E/A ratio:  1.04        Systemic VTI:  0.24 m                            Systemic Diam: 2.10 cm Skeet Latch MD Electronically signed by Skeet Latch MD Signature Date/Time: 01/15/2020/3:50:03 PM    Final

## 2020-01-18 NOTE — Progress Notes (Signed)
[facility] Progress Note  Name: [name] MRN: [id number] DOB: August 06, 1956  Chief Complaint:  Shortness of breath   Subjective:  Feels a little better today.   Had 700 mL UOP over 5/1.  Still very worried about her kidneys.  Note bladder scan checked post void was negligible  Review of systems:    Reports shortness of breath but states is better  No n/v Denies any subjective confusion  Denies cp -------------- Background on consult:  [name] is a 64 y.o. female with a history of CKD, hypertension and chronic diastolic CHF who presented to the hospital with shortness of breath and fluid overload.  She was found to have Covid pneumonia.  Note that she did have a recent admission with critical limb ischemia of the left lower extremity and received arteriogram and angioplasty with stent placement at that time.  She follows with Dr. Marval Regal for her CKD.  Note Cr 1.55 in 11/2019 at [facility] per last admission's consult note.  Her Cr was 2.48 on 4/20 and trended down after that prior to discharge.  She was on lasix 80 mg IV BID - none on 4/29 or today thus far.   She has received colchicine daily.  Got vanc last on 4/27.  She has not been on lisinopril at 5 mg daily at home - she was afraid to start back and it has been held here.  Not altered but she is able to supplement history easier than she can provide it due to resp status.  Her nurse has had her today and yesterday and breathing seems about the same per nurse.   Intake/Output Summary (Last 24 hours) at 01/18/2020 1240 Last data filed at 01/18/2020 0541 Gross per 24 hour  Intake 240 ml  Output 400 ml  Net -160 ml    Vitals:  Vitals:   01/17/20 2000 01/17/20 2158 01/18/20 0000 01/18/20 0400  BP: (!) 127/55 (!) 137/58 (!) 159/81 (!) 131/58  Pulse: 72 73 62 62  Resp: (!) 22  19 19   Temp: 97.7 F (36.5 C)  97.8 F (36.6 C) 98.2 F (36.8 C)  TempSrc: Axillary  Axillary Axillary  SpO2: 92%  96% 97%   Weight:      Height:         Physical Exam:   General adult female in bed  HEENT normocephalic atraumatic extraocular movements intact sclera anicteric Neck supple trachea midline Lungs decreased breath sounds; on 5 liters on my exam  Heart S1S2 no rub Abdomen soft nontender nondistended Extremities no pitting edema  Psych normal mood and affect Neuro alert and oriented x 3; more easily conversant today  Medications reviewed   Labs:  BMP Latest Ref Rng & Units 01/18/2020 01/17/2020 01/16/2020  Glucose 70 - 99 mg/dL 178(H) 159(H) 217(H)  BUN 8 - 23 mg/dL 101(H) 94(H) 81(H)  Creatinine 0.44 - 1.00 mg/dL 2.98(H) 3.11(H) 2.96(H)  BUN/Creat Ratio 12 - 28 - - -  Sodium 135 - 145 mmol/L 138 140 140  Potassium 3.5 - 5.1 mmol/L 4.8 4.5 4.7  Chloride 98 - 111 mmol/L 107 109 109  CO2 22 - 32 mmol/L 18(L) 19(L) 20(L)  Calcium 8.9 - 10.3 mg/dL 8.7(L) 8.6(L) 8.3(L)     Assessment/Plan:   # AKI - Multifactorial prerenal insults.  Hx diuretic administration to optimize resp status.  Recent history of AKI with critical limb ischemia and contrast last admission.  Up/cr ratio 160 mg/g and UA neg protein and 0-5 RBC.  Post void bladder scan negligible  - hopeful for plateau of creatinine and continued improvement with supportive care; hopeful BUN plateaus and trends down - lasix conservatively as needed for respiratory status - hold lasix today  - Would avoid colchicine and judicious use of contrast  # CKD stage III - vascular disease with hx critical limb ischemia as above - Noted that her cr was 1.55 in 11/2019 at [facility] per consult note last admission  -She follows with Dr. Marval Regal at [facility]  # Covid PNA -Therapies per primary team.  A/sp actemra and on remdesivir and steroids  # Acute hypoxic resp failure - covid and CHF contributing  - volume management as above  # Chronic diastolic CHF - diuretics as above - weight trends for this admission and last:  102.5kg on 4/18, 115.6kg on 4/22 then 113.1 kg on 4/28 and 109.8 kg on 4/30.  Improving this admission still above her presentation last admission - needs daily weights - reordered  # Metabolic acidosis - setting of AKI on CKD  - Increase oral bicarb   # Normocytic anemia  - No acute indication for packed red blood cells or ESA   Claudia Desanctis, MD 01/18/2020 1:19 PM

## 2020-01-19 LAB — COMPREHENSIVE METABOLIC PANEL
ALT: 41 U/L (ref 0–44)
AST: 63 U/L — ABNORMAL HIGH (ref 15–41)
Albumin: 2.7 g/dL — ABNORMAL LOW (ref 3.5–5.0)
Alkaline Phosphatase: 70 U/L (ref 38–126)
Anion gap: 12 (ref 5–15)
BUN: 100 mg/dL — ABNORMAL HIGH (ref 8–23)
CO2: 18 mmol/L — ABNORMAL LOW (ref 22–32)
Calcium: 8.6 mg/dL — ABNORMAL LOW (ref 8.9–10.3)
Chloride: 109 mmol/L (ref 98–111)
Creatinine, Ser: 2.74 mg/dL — ABNORMAL HIGH (ref 0.44–1.00)
GFR calc Af Amer: 21 mL/min — ABNORMAL LOW (ref >60)
GFR calc non Af Amer: 18 mL/min — ABNORMAL LOW (ref >60)
Glucose, Bld: 227 mg/dL — ABNORMAL HIGH (ref 70–99)
Potassium: 5.3 mmol/L — ABNORMAL HIGH (ref 3.5–5.1)
Sodium: 139 mmol/L (ref 135–145)
Total Bilirubin: 1 mg/dL (ref 0.3–1.2)
Total Protein: 6.1 g/dL — ABNORMAL LOW (ref 6.5–8.1)

## 2020-01-19 LAB — D-DIMER, QUANTITATIVE: D-Dimer, Quant: 4.5 ug/mL-FEU — ABNORMAL HIGH (ref 0.00–0.50)

## 2020-01-19 LAB — CBC
HCT: 36.9 % (ref 36.0–46.0)
Hemoglobin: 11.6 g/dL — ABNORMAL LOW (ref 12.0–15.0)
MCH: 30 pg (ref 26.0–34.0)
MCHC: 31.4 g/dL (ref 30.0–36.0)
MCV: 95.3 fL (ref 80.0–100.0)
Platelets: 361 10*3/uL (ref 150–400)
RBC: 3.87 MIL/uL (ref 3.87–5.11)
RDW: 17.2 % — ABNORMAL HIGH (ref 11.5–15.5)
WBC: 19.9 10*3/uL — ABNORMAL HIGH (ref 4.0–10.5)
nRBC: 0.6 % — ABNORMAL HIGH (ref 0.0–0.2)

## 2020-01-19 LAB — CULTURE, BLOOD (ROUTINE X 2): Culture: NO GROWTH

## 2020-01-19 LAB — GLUCOSE, CAPILLARY
Glucose-Capillary: 211 mg/dL — ABNORMAL HIGH (ref 70–99)
Glucose-Capillary: 155 mg/dL — ABNORMAL HIGH (ref 70–99)
Glucose-Capillary: 162 mg/dL — ABNORMAL HIGH (ref 70–99)
Glucose-Capillary: 166 mg/dL — ABNORMAL HIGH (ref 70–99)

## 2020-01-19 LAB — C-REACTIVE PROTEIN: CRP: 3.6 mg/dL — ABNORMAL HIGH (ref <1.0)

## 2020-01-19 MED ORDER — FUROSEMIDE 10 MG/ML IJ SOLN
80.0000 mg | Freq: Once | INTRAMUSCULAR | Status: AC
  Administered 2020-01-19: 80 mg via INTRAVENOUS
  Filled 2020-01-19: qty 8

## 2020-01-19 NOTE — TOC Progression Note (Addendum)
Transition of Care [facility]) - Progression Note    Patient Details  Name: Brielee Ghosh MRN: [id number] Date of Birth: 1956-01-10  Transition of Care [facility]) CM/SW [place], LCSW Phone Number: 01/19/2020, 10:20 AM  Clinical Narrative:    10:20am-[facility] unable to accept patient's insurance. CSW awaiting response from [facility].   4pm-[facility] declined patient. CSW requesting they reconsider as patient has not other COVID SNF options available.    Expected Discharge Plan: Skilled Nursing Facility Barriers to Discharge: Continued Medical Work up  Expected Discharge Plan and Services Expected Discharge Plan: [place] Choice: [place] arrangements for the past 2 months: Single Family Home                                       Social Determinants of Health (SDOH) Interventions    Readmission Risk Interventions Readmission Risk Prevention Plan 01/09/2020  Transportation Screening Complete  PCP or Specialist Appt within 3-5 Days Complete  HRI or [place] Complete  Social Work Consult for [place] Planning/Counseling Complete  Palliative Care Screening Not Applicable  Medication Review [employer] photographer) Complete  Some recent data might be hidden

## 2020-01-19 NOTE — Progress Notes (Signed)
PROGRESS NOTE                                                                                                                                                                                                             Patient Demographics:    [name], is a 64 y.o. female, DOB - [birth year], [id number]  Outpatient Primary MD for the patient is Pavelock, Ralene Bathe, MD   Admit date - 01/01/2020   LOS - 6  Chief Complaint  Patient presents with   Shortness of Breath   Chest Pain       Brief Narrative: Patient is a 64 y.o. female  PMHx of hypertension, HLD, DM-2, CKD stage IV, PVD s/p stent to left distal SFA on [id number], chronic diastolic heart failure-presented with shortness of breath or cough-found to have acute hypoxic respiratory failure secondary to COVID-19 pneumonia and decompensated diastolic heart failure.  Significant Events: 4/18-4/23>> admit to [facility]-s/p stent to left distal SFA 4/27>> admit to [facility] for hypoxia from Covid/heart failure 4/27>> lower extremity Dopplers negative for DVT. [id number] grade 2 diastolic dysfunction, moderate RV systolic dysfunction.  RVSP 62.6 mmHg. 4/30>> VQ scan-low probability PE  COVID-19 medications: Steroids: 4/27>> Remdesivir: 4/27>> 5/1  Antibiotics: Vancomycin: 4/27 x 1 Cefepime: 4/27 x 1  Microbiology data: 4/27: Blood culture>> no growth 4/28: Blood culture>> no growth  DVT prophylaxis: SQ heparin  Procedures: None  Consults: None    Subjective:   Somewhat better-oxygen requirements coming down-down to 5 L this morning.  Does get short of breath with minimal activity.   Assessment  & Plan :   Acute Hypoxic Resp Failure due to Covid 19 Viral pneumonia and decompensated diastolic heart failure: Somewhat better but still remains tenuous-on 5 L of oxygen this morning-short of breath with minimal activity.  Continue supportive care-VQ scan without PE,  lower extremity Dopplers negative.  Continued attempts to slowly titrate down FiO2.    COVID-19 pneumonia: Hypoxia slowly improving-has completed a course of remdesivir, s/p Actemra on 4/30-remains on steroids.  CRP downtrending.  Continue to follow-continue supportive care.   Fever: afebrile  O2 requirements:  SpO2: 91 % O2 Flow Rate (L/min): 5 L/min   COVID-19 Labs: Recent Labs    01/17/20 0831 01/18/20 0702 01/19/20 0534  DDIMER 3.71* 4.13* 4.50*  FERRITIN 1,538* 1,823*  --   CRP  7.0* 5.1* 3.6*       Component Value Date/Time   BNP 134.9 (H) 01/17/2020 0831    Recent Labs  Lab 01/13/20 2200  PROCALCITON 0.21    Lab Results  Component Value Date   SARSCOV2NAA POSITIVE (A) 01/13/2020   [place] NEGATIVE 01/05/2020   [place] Not Detected 11/24/2019     Prone/Incentive Spirometry: encouraged  incentive spirometry use 3-4/hour.  Decompensated diastolic heart failure: Volume status improved compared to admission-less edema in her lower extremities-she still remains volume overload-Lasix was held for the past few days-due to improving renal function-persistent hypoxemia-after discussion with nephrology-we will give Lasix 80 mg IV x1. Continue to follow weights, electrolytes and volume status closely.  Pulmonary hypertension/RV dysfunction: Not seen in prior echo's-I suspect this is probably from OSA-she has been noncompliant to CPAP, but given her worsening hypoxemia-she underwent a VQ scan-which was low probability for PE.  Lower extremity Dopplers negative.  Briefly on IV heparin-but have switched back to prophylactic dosing.   AKI on CKD stage IV: Urine has improved somewhat today-AKI likely hemodynamically mediated.  Renal ultrasound without any hydronephrosis.  Nephrology following.    Acute metabolic encephalopathy: More awake and alert-much more fluid and answers.  Likely secondary to hypoxemia and AKI.  Continue supportive care.   Elevated troponin: Trend  is flat-not consistent with ACS-likely secondary to underlying CKD-with some component from demand ischemia due to COVID-19/heart failure.  PAD-s/p stent to left SFA on 4/22: Both lower extremities appear warm-continue aspirin, Plavix and statin.  HTN: BP stable-continue Cardura-Lasix and lisinopril on hold.    DM-2 with hyperglycemia (A1c 12.2 on 4/19): CBGs relatively stable-continue Lantus 26 units twice daily-SSI.  Follow and optimize.  Recent Labs    01/18/20 2241 01/19/20 0750 01/19/20 1210  GLUCAP 134* 211* 155*    Thrombocytopenia: Resolved.  Anemia of chronic disease: New baseline-likely related to CKD.  Gout: No flare-hold colchicine for now given AKI.  OSA: Noncompliant to CPAP-continue CPAP nightly if possible in the hospital.  Obesity: Estimated body mass index is 45.74 kg/m as calculated from the following:   Height as of this encounter: 5\' 1"  (1.549 m).   Weight as of this encounter: 109.8 kg.   ABG:    Component Value Date/Time   PHART 7.460 (H) 09/30/2008 1120   PCO2ART 41.0 09/30/2008 1120   PO2ART 83.0 09/30/2008 1120   HCO3 26.4 12/06/2016 0922   HCO3 26.1 12/06/2016 0922   TCO2 28 12/06/2016 0922   TCO2 28 12/06/2016 0922   O2SAT 69.0 12/06/2016 0922   O2SAT 67.0 12/06/2016 0922    Vent Settings: N/A  Condition - Extremely Guarded  Family Communication  : Spoke with daughter-5/3  Code Status :  Full Code  Diet :  Diet Order            Diet heart healthy/carb modified Room service appropriate? Yes; Fluid consistency: Thin; Fluid restriction: 2000 mL Fluid  Diet effective now               Disposition Plan  :   Status is: Inpatient  Remains inpatient appropriate because:Inpatient level of care appropriate due to severity of illness  Dispo: The patient is from: Home              Anticipated d/c is to: Home              Anticipated d/c date is: > 3 days  Patient currently is not medically stable to d/c.  Barriers to  discharge: Hypoxia requiring O2 supplementation/ AKI  Antimicorbials  :    Anti-infectives (From admission, onward)   Start     Dose/Rate Route Frequency Ordered Stop   01/15/20 1200  vancomycin (VANCOREADY) IVPB 1250 mg/250 mL  Status:  Discontinued     1,250 mg 166.7 mL/hr over 90 Minutes Intravenous Every 48 hours 01/13/20 1018 01/14/20 1038   01/14/20 1000  remdesivir 100 mg in sodium chloride 0.9 % 100 mL IVPB     100 mg 200 mL/hr over 30 Minutes Intravenous Daily 01/13/20 1129 01/17/20 1113   01/13/20 1630  valACYclovir (VALTREX) tablet 1,000 mg  Status:  Discontinued     1,000 mg Oral 3 times daily 01/13/20 1557 01/17/20 1147   01/13/20 1230  remdesivir 200 mg in sodium chloride 0.9% 250 mL IVPB     200 mg 580 mL/hr over 30 Minutes Intravenous Once 01/13/20 1129 01/13/20 1800   01/13/20 1015  ceFEPIme (MAXIPIME) 2 g in sodium chloride 0.9 % 100 mL IVPB     2 g 200 mL/hr over 30 Minutes Intravenous  Once 01/13/20 1003 01/13/20 1434   01/13/20 1015  vancomycin (VANCOREADY) IVPB 2000 mg/400 mL     2,000 mg 200 mL/hr over 120 Minutes Intravenous  Once 01/13/20 1012 01/13/20 2258      Inpatient Medications  Scheduled Meds:  vitamin C  500 mg Oral Daily   aspirin EC  81 mg Oral Q breakfast   atorvastatin  40 mg Oral Daily   brimonidine  1 drop Both Eyes BID   clopidogrel  75 mg Oral Daily   dorzolamide-timolol  1 drop Both Eyes BID   doxazosin  4 mg Oral QHS   heparin injection (subcutaneous)  5,000 Units Subcutaneous Q8H   insulin aspart  0-20 Units Subcutaneous TID WC   insulin aspart  0-5 Units Subcutaneous QHS   insulin glargine  26 Units Subcutaneous BID   Ipratropium-Albuterol  1 puff Inhalation Q6H   latanoprost  1 drop Both Eyes QHS   linagliptin  5 mg Oral Daily   methylPREDNISolone (SOLU-MEDROL) injection  40 mg Intravenous Q12H   pantoprazole  40 mg Oral Q1200   prednisoLONE acetate  1 drop Right Eye BID   sodium bicarbonate  1,300 mg Oral  BID   sodium chloride flush  3 mL Intravenous Once   zinc sulfate  220 mg Oral Daily   Continuous Infusions:  PRN Meds:.acetaminophen, chlorpheniramine-HYDROcodone, guaiFENesin-dextromethorphan, ondansetron **OR** ondansetron (ZOFRAN) IV, oxyCODONE-acetaminophen   Time Spent in minutes  25  See all Orders from today for further details   Oren Binet M.D on 01/19/2020 at 2:41 PM  To page go to [email or web address] - use universal password  [facility] -  Office  [phone]    Objective:   Vitals:   01/18/20 2348 01/19/20 0505 01/19/20 0644 01/19/20 1300  BP:  (!) 172/75  (!) 149/59  Pulse: 61 (!) 57 (!) 51   Resp: 17 (!) 26 16   Temp:  (!) 97.4 F (36.3 C)  98.2 F (36.8 C)  TempSrc:  Oral  Oral  SpO2: 92% 94% 91%   Weight:      Height:        Wt Readings from Last 3 Encounters:  01/16/20 109.8 kg  01/08/20 115.6 kg  07/15/19 102.9 kg     Intake/Output Summary (Last 24 hours) at 01/19/2020 1441 Last data filed at 01/19/2020 0509 Gross per  24 hour  Intake --  Output 300 ml  Net -300 ml    Physical Exam Gen Exam:Alert awake-not in any distress HEENT:atraumatic, normocephalic Chest: B/L clear to auscultation anteriorly CVS:S1S2 regular Abdomen:soft non tender, non distended Extremities:+ edema Neurology: Non focal Skin: no rash   Data Review:    CBC Recent Labs  Lab 01/14/20 0307 01/14/20 0307 01/15/20 0528 01/16/20 [id number] 01/17/20 0831 01/18/20 0702 01/19/20 0534  WBC 5.1   < > 9.1 14.7* 16.8* 20.4* 19.9*  HGB 10.0*   < > 10.1* 10.0* 10.8* 11.3* 11.6*  HCT 31.4*   < > 31.2* 30.9* 32.7* 34.2* 36.9  PLT 143*   < > 208 273 323 336 361  MCV 94.6   < > 92.0 92.2 90.1 91.2 95.3  MCH 30.1   < > 29.8 29.9 29.8 30.1 30.0  MCHC 31.8   < > 32.4 32.4 33.0 33.0 31.4  RDW 17.1*   < > 16.9* 16.9* 16.7* 16.9* 17.2*  LYMPHSABS 0.5*  --  1.0 1.3 1.4 1.5  --   MONOABS 0.2  --  0.4 0.8 1.1* 1.3*  --   EOSABS 0.0  --  0.0 0.0 0.0 0.0  --   BASOSABS 0.0  --   0.0 0.0 0.0 0.0  --    < > = values in this interval not displayed.    Chemistries  Recent Labs  Lab 01/14/20 0307 01/14/20 0307 01/15/20 0528 01/16/20 0632 01/17/20 0831 01/18/20 0702 01/19/20 0534  NA 140   < > 140 140 140 138 139  K 4.5   < > 4.7 4.7 4.5 4.8 5.3*  CL 109   < > 109 109 109 107 109  CO2 19*   < > 19* 20* 19* 18* 18*  GLUCOSE 196*   < > 253* 217* 159* 178* 227*  BUN 44*   < > 63* 81* 94* 101* 100*  CREATININE 2.08*   < > 2.48* 2.96* 3.11* 2.98* 2.74*  CALCIUM 8.1*   < > 8.2* 8.3* 8.6* 8.7* 8.6*  MG 1.6*  --  2.0 2.1 2.4 2.4  --   AST 62*   < > 57* 60* 69* 61* 63*  ALT 34   < > 31 32 43 38 41  ALKPHOS 38   < > 47 59 71 73 70  BILITOT 0.8   < > 0.6 0.9 0.9 0.8 1.0   < > = values in this interval not displayed.   ------------------------------------------------------------------------------------------------------------------ No results for input(s): CHOL, HDL, LDLCALC, TRIG, CHOLHDL, LDLDIRECT in the last 72 hours.  Lab Results  Component Value Date   HGBA1C 12.2 (H) 01/05/2020   ------------------------------------------------------------------------------------------------------------------ No results for input(s): TSH, T4TOTAL, T3FREE, THYROIDAB in the last 72 hours.  Invalid input(s): FREET3 ------------------------------------------------------------------------------------------------------------------ Recent Labs    01/17/20 0831 01/18/20 0702  FERRITIN 1,538* 1,823*    Coagulation profile No results for input(s): INR, PROTIME in the last 168 hours.  Recent Labs    01/18/20 0702 01/19/20 0534  DDIMER 4.13* 4.50*    Cardiac Enzymes No results for input(s): CKMB, TROPONINI, MYOGLOBIN in the last 168 hours.  Invalid input(s): CK ------------------------------------------------------------------------------------------------------------------    Component Value Date/Time   BNP 134.9 (H) 01/17/2020 0831    Micro Results Recent  Results (from the past 240 hour(s))  Respiratory Panel by RT PCR (Flu A&B, Covid) - Nasopharyngeal Swab     Status: Abnormal   Collection Time: 01/13/20 10:25 AM   Specimen: Nasopharyngeal Swab  Result Value  Ref Range Status   SARS Coronavirus 2 by RT PCR POSITIVE (A) NEGATIVE Final    Comment: RESULT CALLED TO, READ BACK BY AND VERIFIED WITH: Sheran Lawless RN 12:25 01/13/20 ([email or web address]) (NOTE) SARS-CoV-2 target nucleic acids are DETECTED. SARS-CoV-2 RNA is generally detectable in upper respiratory specimens  during the acute phase of infection. Positive results are indicative of the presence of the identified virus, but do not rule out bacterial infection or co-infection with other pathogens not detected by the test. Clinical correlation with patient history and other diagnostic information is necessary to determine patient infection status. The expected result is Negative. Fact Sheet for Patients:  PinkCheek.be Fact Sheet for Healthcare Providers: GravelBags.it This test is not yet approved or cleared by the [place] FDA and  has been authorized for detection and/or diagnosis of SARS-CoV-2 by FDA under an Emergency Use Authorization (EUA).  This EUA will remain in effect (meaning this test can be used)  for the duration of  the COVID-19 declaration under Section 564(b)(1) of the Act, 21 U.S.C. section 360bbb-3(b)(1), unless the authorization is terminated or revoked sooner.    Influenza A by PCR NEGATIVE NEGATIVE Final   Influenza B by PCR NEGATIVE NEGATIVE Final    Comment: (NOTE) The Xpert Xpress SARS-CoV-2/FLU/RSV assay is intended as an aid in  the diagnosis of influenza from Nasopharyngeal swab specimens and  should not be used as a sole basis for treatment. Nasal washings and  aspirates are unacceptable for Xpert Xpress SARS-CoV-2/FLU/RSV  testing. Fact Sheet for  Patients: PinkCheek.be Fact Sheet for Healthcare Providers: GravelBags.it This test is not yet approved or cleared by the [place] FDA and  has been authorized for detection and/or diagnosis of SARS-CoV-2 by  FDA under an Emergency Use Authorization (EUA). This EUA will remain  in effect (meaning this test can be used) for the duration of the  Covid-19 declaration under Section 564(b)(1) of the Act, 21  U.S.C. section 360bbb-3(b)(1), unless the authorization is  terminated or revoked. Performed at [facility], [place] [address], [place], [place] [address]   Blood culture (routine x 2)     Status: None   Collection Time: 01/13/20 11:10 AM   Specimen: BLOOD  Result Value Ref Range Status   Specimen Description BLOOD LEFT ANTECUBITAL  Final   Special Requests   Final    BOTTLES DRAWN AEROBIC AND ANAEROBIC Blood Culture results may not be optimal due to an inadequate volume of blood received in culture bottles   Culture   Final    NO GROWTH 5 DAYS Performed at [facility], [place] [address] [place],  [place] [address]    Report Status 01/18/2020 FINAL  Final  Blood culture (routine x 2)     Status: None   Collection Time: 01/14/20  8:51 AM   Specimen: BLOOD LEFT HAND  Result Value Ref Range Status   Specimen Description BLOOD LEFT HAND  Final   Special Requests   Final    BOTTLES DRAWN AEROBIC AND ANAEROBIC Blood Culture results may not be optimal due to an inadequate volume of blood received in culture bottles   Culture   Final    NO GROWTH 5 DAYS Performed at [facility], [place] [address], [place], [place] [address]    Report Status 01/19/2020 FINAL  Final    Radiology Reports DG Chest 2 View  Result Date: 01/13/2020 CLINICAL DATA:  Shortness of breath and bilateral leg swelling. EXAM: CHEST - 2  VIEW COMPARISON:  November 30, 2016 FINDINGS: Moderate severity diffuse multifocal infiltrates are seen,  bilaterally. There is no evidence of a pleural effusion or pneumothorax. The cardiac silhouette is mildly enlarged. A radiopaque fusion plate and screws are seen overlying the lower cervical spine. The visualized skeletal structures are otherwise unremarkable. Radiopaque surgical clips are seen within the right upper quadrant. IMPRESSION: Moderate severity bilateral multifocal infiltrates. Electronically Signed   By: Virgina Norfolk M.D.   On: 01/13/2020 00:27   NM Pulmonary Perfusion  Result Date: 01/16/2020 CLINICAL DATA:  Shortness of breath, history of COVID-19 positivity EXAM: NUCLEAR MEDICINE PERFUSION LUNG SCAN TECHNIQUE: Perfusion images were obtained in multiple projections after intravenous injection of radiopharmaceutical. Ventilation scans intentionally deferred if perfusion scan and chest x-ray adequate for interpretation during COVID 19 epidemic. RADIOPHARMACEUTICALS:  1.6 mCi Tc-77m MAA IV COMPARISON:  Chest x-ray from earlier in the same day. FINDINGS: Perfusion images demonstrate adequate uptake throughout both lungs despite the changes seen on recent chest x-ray. No focal defect to suggest pulmonary embolism is noted. IMPRESSION: No evidence of pulmonary embolism. Electronically Signed   By: Inez Catalina M.D.   On: 01/16/2020 16:23   US RENAL  Result Date: 01/16/2020 CLINICAL DATA:  Acute renal insufficiency. EXAM: RENAL / URINARY TRACT ULTRASOUND COMPLETE COMPARISON:  None. FINDINGS: Exam difficult due to patient body habitus. Right Kidney: Renal measurements: 9.4 x 4.2 x 4.5 cm = volume: 94 mL . Echogenicity within normal limits. Mild cortical thinning. No mass or hydronephrosis visualized. Left Kidney: Renal measurements: 10.1 x 5.2 x 5.5 cm = volume: 150 mL. Echogenicity within normal limits. Mild cortical thinning. No mass or hydronephrosis visualized. Bladder: Not visualized. Other: None. IMPRESSION: Normal size kidneys without hydronephrosis. Electronically Signed   By: Marin Olp M.D.   On: 01/16/2020 16:38   US RENAL  Result Date: 01/06/2020 CLINICAL DATA:  Chronic kidney disease EXAM: RENAL / URINARY TRACT ULTRASOUND COMPLETE COMPARISON:  None. FINDINGS: Right Kidney: Renal measurements: 9.3 x 4.3 x 4.6 cm = volume: 96 mL. Lobulated contour. No hydronephrosis. Normal echogenicity. Left Kidney: Renal measurements: 10.7 x 5.0 x 5.2 cm = volume: 145 mL. Lobulated contour without hydronephrosis. Normal echogenicity. Bladder: Decompressed. Other: Visualization is generally limited by body habitus. IMPRESSION: No hydronephrosis. Lobulated renal contours without focal parenchymal abnormality. Electronically Signed   By: Ulyses Jarred M.D.   On: 01/06/2020 16:16   PERIPHERAL VASCULAR CATHETERIZATION  Result Date: 01/08/2020 Patient name: Moselle Younghans   MRN: [id number]        DOB: Jun 26, 1956            Sex: female  01/08/2020 Pre-operative Diagnosis: Critical limb ischemia of the left lower extremity with tissue loss Post-operative diagnosis:  Same Surgeon:  Marty Heck, MD Procedure Performed: 1.  Ultrasound guided access of the right common femoral artery 2.  Aortogram with CO2 including catheter selection of the aorta 3.  Left lower extremity arteriogram with selection of third order branches 4.  Left distal SFA above-knee popliteal artery angioplasty with stent placement (primarily stented with a 6 mm x 60 mm drug-coated Eluvia postdilated with a 5 mm Mustang) 5.  Left anterior tibial angioplasty for focal perforation with resolution of the perforation (3 mm Sterling) 6.  Mynx closure of the right common femoral artery 7.  55 minutes of monitored moderate conscious sedation time   Indications: Patient is a 64 year old female who was seen in consultation by Dr. Trula Slade for left lower extremity rest pain with severely  depressed ABIs and duplex showing likely popliteal disease.  She presents today for left lower extremity arteriogram after risk benefits discussed.   Findings:  Aortogram was performed with CO2 to limit contrast and there was no flow-limiting aortoiliac stenosis.  Left lower extremity arteriogram showed a patent common femoral profunda and widely patent SFA in the proximal to mid segment.  At [facility] canal in the distal SFA and above-knee popliteal artery showed a very short focal occlusion with large collateral around this.  She had a patent below-knee popliteal artery with three-vessel runoff.  Anterior tibial appeared to be dominant runoff.   Ultimately this lesion was crossed in the distal SFA/above knee popliteal artery and primarily stented with a 6 mm x 60 mm Eluvia.  She now has excellent inline flow down the left lower extremity with no residual stenosis and three-vessel runoff.  There was a focal perforation in the anterior tibial where our wire likely made a perforation during exchange of balloons and stents.  Ultimately this was treated with a very low inflation of a 3 mm Sterling for 3 minutes with resolution.  Patient has preserved three vessel runoff.              Procedure:  The patient was identified in the holding area and taken to room 8.  The patient was then placed supine on the table and prepped and draped in the usual sterile fashion.  A time out was called.  Ultrasound was used to evaluate the right common femoral artery.  It was patent .  A digital ultrasound image was acquired.  A micropuncture needle was used to access the right common femoral artery under ultrasound guidance.  An 018 wire was advanced without resistance and a micropuncture sheath was placed.  The 018 wire was removed and a benson wire was placed.  The micropuncture sheath was exchanged for a 5 french sheath.  An omniflush catheter was advanced over the wire to the level of L-1.  An abdominal angiogram was obtained.  Next, using glidewire and straight 4 French flush catheter the aortic bifurcation was cross because the Omni would not track.  Placed the catheter into  theleft external iliac artery and left runoff was obtained.  Ultimately after evaluating the pictures elected to intervene on the distal left SFA above-knee popliteal occlusion.  Used a long Glidewire advantage down the SFA and exchanged for a 6 [place] Ansell sheath in the right groin over the aortic bifurcation.  Patient was given 100 units per kilogram heparin.  I then primarily stented this lesion with a 6 mm x 60 mm Eluvia that was postdilated with a 5 mm x 60 mm Mustang.  Final pictures showed no residual stenosis with excellent inline flow down the left lower extremity and three-vessel runoff.  There was a focal perforation in the proximal anterior tibial that I think was from our wire.  I then exchanged for a V 18 wire down the anterior tibial and then did a very slow inflation with a 3 mm Sterling in the left anterior tibial artery for 3 minutes.  There appeared to be resolution of the perforation after taking multiple additional pictures.  Wires and catheters removed and a short 6 French sheath was placed in the right groin.  We placed a mynx closure device.   Plan: Patient will be loaded on Plavix and will need to continue aspirin and Plavix.  Should have inline flow down left leg now.   Marty Heck, MD  [facility] Office: [phone]   DG Chest [place] 1V same Day  Result Date: 01/16/2020 CLINICAL DATA:  Shortness of breath.  COVID. EXAM: PORTABLE CHEST 1 VIEW COMPARISON:  Three days ago FINDINGS: More confluent bilateral interstitial and airspace opacity. Cardiomegaly. No visible effusion or pneumothorax. IMPRESSION: Worsening bilateral pneumonia. Electronically Signed   By: Monte Fantasia M.D.   On: 01/16/2020 08:34   DG Foot Complete Left  Result Date: 01/05/2020 CLINICAL DATA:  Swelling EXAM: LEFT FOOT - COMPLETE 3+ VIEW COMPARISON:  None. FINDINGS: Degenerative changes in the 1st MTP joint. No acute bony abnormality. Specifically, no fracture,  subluxation, or dislocation. Vascular calcifications noted. No bone destruction seen to suggest osteomyelitis. IMPRESSION: No acute bony abnormality. Electronically Signed   By: Rolm Baptise M.D.   On: 01/05/2020 01:25   VAS [place] ABI WITH/WO TBI  Result Date: 01/05/2020 LOWER EXTREMITY DOPPLER STUDY Indications: Blue, cold toes. High Risk Factors: Hypertension, Diabetes.  Limitations: Today's exam was limited due to Patient body habitus. Comparison Study: No prior studies. Performing Technologist: Carlos Levering Rvt  Examination Guidelines: A complete evaluation includes at minimum, Doppler waveform signals and systolic blood pressure reading at the level of bilateral brachial, anterior tibial, and posterior tibial arteries, when vessel segments are accessible. Bilateral testing is considered an integral part of a complete examination. Photoelectric Plethysmograph (PPG) waveforms and toe systolic pressure readings are included as required and additional duplex testing as needed. Limited examinations for reoccurring indications may be performed as noted.  ABI Findings: +---------+------------------+-----+---------+--------+  Right     Rt Pressure (mmHg) Index Waveform  Comment   +---------+------------------+-----+---------+--------+  Brachial  150                      triphasic           +---------+------------------+-----+---------+--------+  PTA       108                0.72  biphasic            +---------+------------------+-----+---------+--------+  DP        137                0.91  triphasic           +---------+------------------+-----+---------+--------+  Great Toe 76                 0.51                      +---------+------------------+-----+---------+--------+ +---------+------------------+-----+----------+-------+  Left      Lt Pressure (mmHg) Index Waveform   Comment  +---------+------------------+-----+----------+-------+  Brachial  136                      triphasic            +---------+------------------+-----+----------+-------+  PTA       66                 0.44  monophasic          +---------+------------------+-----+----------+-------+  DP        53                 0.35  monophasic          +---------+------------------+-----+----------+-------+  Great Toe 0                  0.00                      +---------+------------------+-----+----------+-------+ +-------+-----------+-----------+------------+------------+  ABI/TBI Today's ABI Today's TBI Previous ABI Previous TBI  +-------+-----------+-----------+------------+------------+  Right   0.91        0.51                                   +-------+-----------+-----------+------------+------------+  Left    0.44        0                                      +-------+-----------+-----------+------------+------------+  Summary: Right: Resting right ankle-brachial index indicates mild right lower extremity arterial disease. The right toe-brachial index is abnormal. Left: Resting left ankle-brachial index indicates severe left lower extremity arterial disease. The left toe-brachial index is abnormal.  *See table(s) above for measurements and observations.  Electronically signed by [name] MD on 01/05/2020 at 7:35:40 PM.    Final    VAS [place] LOWER EXTREMITY ARTERIAL DUPLEX  Result Date: 01/05/2020 LOWER EXTREMITY ARTERIAL DUPLEX STUDY Indications: Blue, cold toes. High Risk Factors: Hypertension, Diabetes.  Current ABI: R 0.91 L 0.44 Limitations: Patient body habitus, poor ultrasound/tissue interface. Comparison Study: No prior studies. Performing Technologist: Oliver Hum RVT  Examination Guidelines: A complete evaluation includes B-mode imaging, spectral Doppler, color Doppler, and power Doppler as needed of all accessible portions of each vessel. Bilateral testing is considered an integral part of a complete examination. Limited examinations for reoccurring indications may be performed as noted.   +-----------+--------+-----+--------+----------+------------------+  LEFT        PSV cm/s Ratio Stenosis Waveform   Comments            +-----------+--------+-----+--------+----------+------------------+  EIA Distal  116                     biphasic                       +-----------+--------+-----+--------+----------+------------------+  CFA Mid     132                     biphasic                       +-----------+--------+-----+--------+----------+------------------+  DFA         111                     biphasic                       +-----------+--------+-----+--------+----------+------------------+  SFA Prox    104                     biphasic                       +-----------+--------+-----+--------+----------+------------------+  SFA Mid     85                      biphasic                       +-----------+--------+-----+--------+----------+------------------+  SFA Distal  91                      biphasic                       +-----------+--------+-----+--------+----------+------------------+  POP Prox    26                      monophasic                     +-----------+--------+-----+--------+----------+------------------+  POP Distal  0                                  Unable to insonate  +-----------+--------+-----+--------+----------+------------------+  ATA Prox    73                      biphasic                       +-----------+--------+-----+--------+----------+------------------+  ATA Mid     33                      monophasic                     +-----------+--------+-----+--------+----------+------------------+  ATA Distal  32                      monophasic                     +-----------+--------+-----+--------+----------+------------------+  PTA Prox                                       Unable to insonate  +-----------+--------+-----+--------+----------+------------------+  PTA Mid     27                      monophasic                      +-----------+--------+-----+--------+----------+------------------+  PTA Distal  9                       monophasic                     +-----------+--------+-----+--------+----------+------------------+  PERO Prox                                      Unable to insonate  +-----------+--------+-----+--------+----------+------------------+  PERO Mid                                       Unable to insonate  +-----------+--------+-----+--------+----------+------------------+  PERO Distal 15                      monophasic                     +-----------+--------+-----+--------+----------+------------------+  DP          29                      monophasic                     +-----------+--------+-----+--------+----------+------------------+  Summary: Left: The external iliac, common femoral, and superficial femoral arteries appear patent with no significant  stenosis. Monophasic waveforms are noted in the proximal popliteal artery with poor visualization of the distal segment. Which suggests a possible occlusion. Anterior tibial artery appears patent throughout with monophasic flow. The posterior tibial, and peroneal arteries demonstrated monophasic flow with likely areas of occlusive disease. The proximal calf arteries were difficult to visualize.  See table(s) above for measurements and observations. Electronically signed by [name] MD on 01/05/2020 at 49:34:41 PM.    Final    VAS [place] LOWER EXTREMITY VENOUS (DVT)  Result Date: 01/13/2020  Lower Venous DVTStudy Indications: Swelling.  Limitations: Body habitus and poor ultrasound/tissue interface. Performing Technologist: Antonieta Pert RDMS, RVT  Examination Guidelines: A complete evaluation includes B-mode imaging, spectral Doppler, color Doppler, and power Doppler as needed of all accessible portions of each vessel. Bilateral testing is considered an integral part of a complete examination. Limited examinations for reoccurring indications may be performed  as noted. The reflux portion of the exam is performed with the patient in reverse Trendelenburg.  +---------+---------------+---------+-----------+----------+-------------------+  RIGHT     Compressibility Phasicity Spontaneity Properties Thrombus Aging       +---------+---------------+---------+-----------+----------+-------------------+  CFV       Partial         Yes       Yes                    patient body                                                                     habitus and                                                                      position limiting                                                                evaluation           +---------+---------------+---------+-----------+----------+-------------------+  SFJ       Full                                                                  +---------+---------------+---------+-----------+----------+-------------------+  FV Prox                             Yes                                         +---------+---------------+---------+-----------+----------+-------------------+  FV Mid                              Yes                                         +---------+---------------+---------+-----------+----------+-------------------+  FV Distal                           Yes                                         +---------+---------------+---------+-----------+----------+-------------------+  PFV       Full                                                                  +---------+---------------+---------+-----------+----------+-------------------+  POP       Full                                                                  +---------+---------------+---------+-----------+----------+-------------------+  PTV                                                        not clearly                                                                      visualized            +---------+---------------+---------+-----------+----------+-------------------+  PERO                                                       not clearly                                                                      visualized           +---------+---------------+---------+-----------+----------+-------------------+   +---------+---------------+---------+-----------+----------+------------------+  LEFT      Compressibility Phasicity Spontaneity Properties Thrombus Aging      +---------+---------------+---------+-----------+----------+------------------+  CFV  Full            Yes       Yes                                        +---------+---------------+---------+-----------+----------+------------------+  SFJ       Full                                                                 +---------+---------------+---------+-----------+----------+------------------+  FV Prox   Full                                                                 +---------+---------------+---------+-----------+----------+------------------+  FV Mid    Full                                                                 +---------+---------------+---------+-----------+----------+------------------+  FV Distal                           Yes                                        +---------+---------------+---------+-----------+----------+------------------+  PFV       Full                                                                 +---------+---------------+---------+-----------+----------+------------------+  POP       Full            Yes       Yes                                        +---------+---------------+---------+-----------+----------+------------------+  PTV                                                        not clearly  visualized          +---------+---------------+---------+-----------+----------+------------------+  PERO                                                        not clearly                                                                     visualized          +---------+---------------+---------+-----------+----------+------------------+    Summary: RIGHT: - There is no evidence of deep vein thrombosis in the lower extremity. However, portions of this examination were limited- see technologist comments above.  LEFT: - There is no evidence of deep vein thrombosis in the lower extremity. However, portions of this examination were limited- see technologist comments above.  - Prominent lymph nodes noted bilateral groin areas.  *See table(s) above for measurements and observations. Electronically signed by Deitra Mayo MD on 01/13/2020 at 6:50:27 PM.    Final    ECHOCARDIOGRAM LIMITED  Result Date: 01/15/2020    ECHOCARDIOGRAM LIMITED REPORT   Patient Name:   KARNESHA SOBOLEWSKI Date of Exam: 01/15/2020 Medical Rec #:  [id number]     Height:       61.0 in Accession #:    [id number]    Weight:       249.3 lb Date of Birth:  06-15-1956      BSA:          2.074 m Patient Age:    56 years      BP:           131/70 mmHg Patient Gender: F             HR:           53 bpm. Exam Location:  Inpatient Procedure: Limited Echo, Limited Color Doppler and Cardiac Doppler Indications:    Acute Respiratory Insufficiency R06.89  History:        Patient has prior history of Echocardiogram examinations, most                 recent 12/02/2016. Risk Factors:Hypertension and Dyslipidemia.  Sonographer:    Mikki Santee RDCS (AE) Referring Phys: [employer]  1. Left ventricular diastolic parameters are consistent with Grade II diastolic dysfunction (pseudonormalization).  2. Right ventricular systolic function is moderately reduced. There is severely elevated pulmonary artery systolic pressure.  3. The inferior vena cava is normal in size with greater than 50% respiratory variability, suggesting right atrial pressure of 3 mmHg. FINDINGS   Left Ventricle: Indeterminate filling pressures. Right Ventricle: Right ventricular systolic function is moderately reduced. There is severely elevated pulmonary artery systolic pressure. The tricuspid regurgitant velocity is 3.86 m/s, and with an assumed right atrial pressure of 3 mmHg, the estimated right ventricular systolic pressure is [id number]. Tricuspid Valve: Tricuspid valve regurgitation is mild. Venous: The inferior vena cava is normal in size with greater than 50% respiratory variability, suggesting right atrial pressure of 3 mmHg.  LEFT VENTRICLE PLAX 2D LVOT diam:     2.10 cm  Diastology LV  SV:         67       LV e' lateral:   5.61 cm/s LV SV Index:   39       LV E/e' lateral: 12.1 LVOT Area:     3.46 cm LV e' medial:    6.40 cm/s                         LV E/e' medial:  10.6  RIGHT VENTRICLE TAPSE (M-mode): 1.5 cm LEFT ATRIUM           Index       RIGHT ATRIUM           Index LA Vol (A4C): 37.4 ml 18.03 ml/m RA Area:     16.80 cm                                   RA Volume:   44.50 ml  21.45 ml/m  AORTIC VALVE LVOT Vmax:   98.60 cm/s LVOT Vmean:  69.800 cm/s LVOT VTI:    0.235 m MITRAL VALVE               TRICUSPID VALVE MV Area (PHT): 3.53 cm    TR Peak grad:   59.6 mmHg MV Decel Time: 215 msec    TR Vmax:        386.00 cm/s MV E velocity: 67.70 cm/s MV A velocity: 65.10 cm/s  SHUNTS MV E/A ratio:  1.04        Systemic VTI:  0.24 m                            Systemic Diam: 2.10 cm Skeet Latch MD Electronically signed by Skeet Latch MD Signature Date/Time: 01/15/2020/3:50:03 PM    Final

## 2020-01-19 NOTE — Progress Notes (Signed)
Physical Therapy Treatment Patient Details Name: [name] MRN: [id number] DOB: 02/21/1956 Today's Date: 01/19/2020    History of Present Illness 64 year old female admitted 01/02/2020 with SOB and cough. Patient with acute hypoxic respiratory failure due to COVID PNA and decompensatd diastolic heart failure. Of note, patient with recent hospitalization 4/18-4/23/21 for stent to L distal SFA. This admission, steroids and Remdesivir started 01/13/20. Patient with elevated troponin likely secondary to underlying CKD, not consistent with ACS. PMH: hypertension, HLD, DM-2, CKD stage IV, PVD s/p stent to left distal SFA on [id number], chronic diastolic heart failure    PT Comments    Pt limited due to fatigue, dyspnea, and decreasing O2 sats.  BP were stable with sit to stand today, but O2 sats down to 77% with minimal activity and requiring at least 2 mins recovery.  Pt required rest breaks, cues for breathing and transfer techniques.  Pt unable to ambulate due to DOE of 4/4 and decreased o2 sats.  Cont to progress as able.    Follow Up Recommendations  SNF;Supervision/Assistance - 24 hour     Equipment Recommendations  Rolling walker with 5" wheels;Wheelchair cushion (measurements PT);3in1 (PT);Wheelchair (measurements PT) - TO BE FURTHER Assessed next venue   Recommendations for Other Services       Precautions / Restrictions Precautions Precautions: Fall    Mobility  Bed Mobility               General bed mobility comments: in chair at arrival  Transfers Overall transfer level: Needs assistance Equipment used: Rolling walker (2 wheeled) Transfers: Sit to/from Stand Sit to Stand: Min assist         General transfer comment: min A to power up; cues for hand placement; performed x 2 with rest breaks  Ambulation/Gait             General Gait Details: unable due to fatigue, dizzy and [facility]   Stairs             Wheelchair Mobility    Modified Rankin (Stroke  Patients Only)       Balance Overall balance assessment: Needs assistance Sitting-balance support: Feet supported;Single extremity supported Sitting balance-Leahy Scale: Fair Sitting balance - Comments: Edge of chair for 15 mins throughout treat with SBA     Standing balance-Leahy Scale: Fair Standing balance comment: contact guard with RW -only tolerated standing x 2 for ~15-20 sec each                            Cognition Arousal/Alertness: Awake/alert Behavior During Therapy: Flat affect Overall Cognitive Status: No family/caregiver present to determine baseline cognitive functioning                                 General Comments: Requires increased time between tasks, often "lost train of thought"; increased time to initiate and respond      Exercises      General Comments General comments (skin integrity, edema, etc.): Pt on 8 L HFNC with O2 93% rest. Dropped to 77% with standing with good pleth. Took 2 minutes rest and cues for pursed lip breathing to recover to 90%.   BP was stable 149/59 and 152/61.  HR was bradycardic at 48-57 bpm throughout therapy      Pertinent Vitals/Pain Pain Assessment: No/denies pain    Home Living  Prior Function            PT Goals (current goals can now be found in the care plan section) Progress towards PT goals: Progressing toward goals    Frequency    Min 2X/week      PT Plan Current plan remains appropriate    Co-evaluation              AM-PAC PT "6 Clicks" Mobility   Outcome Measure  Help needed turning from your back to your side while in a flat bed without using bedrails?: A Little Help needed moving from lying on your back to sitting on the side of a flat bed without using bedrails?: A Lot Help needed moving to and from a bed to a chair (including a wheelchair)?: A Lot Help needed standing up from a chair using your arms (e.g., wheelchair or bedside  chair)?: A Little Help needed to walk in hospital room?: A Lot Help needed climbing 3-5 steps with a railing? : A Lot 6 Click Score: 14    End of Session Equipment Utilized During Treatment: Gait belt Activity Tolerance: Patient limited by fatigue Patient left: in chair;with call bell/phone within reach;with chair alarm set   PT Visit Diagnosis: Other abnormalities of gait and mobility (R26.89);Muscle weakness (generalized) (M62.81);Pain     Time: [id number] PT Time Calculation (min) (ACUTE ONLY): 26 min  Charges:  $Therapeutic Activity: 23-37 mins                     Maggie Font, PT Acute Rehab Services Pager [phone] [facility] [phone]    Karlton Lemon 01/19/2020, 5:07 PM

## 2020-01-19 NOTE — Progress Notes (Signed)
Patient ID: [name], female   DOB: 1956/03/17, 64 y.o.   MRN: [id number] S: Still feels short of breath O:BP (!) 149/59 (BP Location: Right Arm)   Pulse (!) 51   Temp 98.2 F (36.8 C) (Oral)   Resp 16   Ht 5\' 1"  (1.549 m)   Wt 109.8 kg   LMP 02/16/2017 (Within Weeks) Comment: tubal ligation  SpO2 91%   BMI 45.74 kg/m   Intake/Output Summary (Last 24 hours) at 01/19/2020 1450 Last data filed at 01/19/2020 0509 Gross per 24 hour  Intake --  Output 300 ml  Net -300 ml   Intake/Output: I/O last 3 completed shifts: In: 240 [P.O.:240] Out: 700 [Urine:700]  Intake/Output this shift:  No intake/output data recorded. Weight change:  Gen: Obese AAF in mild respiratory distress CVS: bradycardic at 51, no rub Resp: decreased BS at bases Abd: obese, +BS, soft Ext: 1+ pitting edema bilateral lower extremities  Recent Labs  Lab 01/13/20 0002 01/14/20 0307 01/15/20 0528 01/16/20 0632 01/17/20 0831 01/18/20 0702 01/19/20 0534  NA 135 140 140 140 140 138 139  K 4.2 4.5 4.7 4.7 4.5 4.8 5.3*  CL 106 109 109 109 109 107 109  CO2 19* 19* 19* 20* 19* 18* 18*  GLUCOSE 238* 196* 253* 217* 159* 178* 227*  BUN 40* 44* 63* 81* 94* 101* 100*  CREATININE 2.04* 2.08* 2.48* 2.96* 3.11* 2.98* 2.74*  ALBUMIN  --  2.3* 2.2* 2.2* 2.5* 2.6* 2.7*  CALCIUM 7.9* 8.1* 8.2* 8.3* 8.6* 8.7* 8.6*  PHOS  --  4.6 5.3*  --   --   --   --   AST  --  62* 57* 60* 69* 61* 63*  ALT  --  34 31 32 43 38 41   Liver Function Tests: Recent Labs  Lab 01/17/20 0831 01/18/20 0702 01/19/20 0534  AST 69* 61* 63*  ALT 43 38 41  ALKPHOS 71 73 70  BILITOT 0.9 0.8 1.0  PROT 6.0* 6.2* 6.1*  ALBUMIN 2.5* 2.6* 2.7*   No results for input(s): LIPASE, AMYLASE in the last 168 hours. No results for input(s): AMMONIA in the last 168 hours. CBC: Recent Labs  Lab 01/15/20 0528 01/15/20 0528 01/16/20 [id number] 01/16/20 [id number] 01/17/20 0831 01/18/20 0702 01/19/20 0534  WBC 9.1   < > 14.7*   < > 16.8* 20.4* 19.9*   NEUTROABS 7.6   < > 12.4*  --  13.9* 17.0*  --   HGB 10.1*   < > 10.0*   < > 10.8* 11.3* 11.6*  HCT 31.2*   < > 30.9*   < > 32.7* 34.2* 36.9  MCV 92.0  --  92.2  --  90.1 91.2 95.3  PLT 208   < > 273   < > 323 336 361   < > = values in this interval not displayed.   Cardiac Enzymes: No results for input(s): CKTOTAL, CKMB, CKMBINDEX, TROPONINI in the last 168 hours. CBG: Recent Labs  Lab 01/18/20 1206 01/18/20 1642 01/18/20 2241 01/19/20 0750 01/19/20 1210  GLUCAP 166* 163* 134* 211* 155*    Iron Studies:  Recent Labs    01/18/20 0702  FERRITIN 1,823*   Studies/Results: No results found. . vitamin C  500 mg Oral Daily  . aspirin EC  81 mg Oral Q breakfast  . atorvastatin  40 mg Oral Daily  . brimonidine  1 drop Both Eyes BID  . clopidogrel  75 mg Oral Daily  . dorzolamide-timolol  1  drop Both Eyes BID  . doxazosin  4 mg Oral QHS  . heparin injection (subcutaneous)  5,000 Units Subcutaneous Q8H  . insulin aspart  0-20 Units Subcutaneous TID WC  . insulin aspart  0-5 Units Subcutaneous QHS  . insulin glargine  26 Units Subcutaneous BID  . Ipratropium-Albuterol  1 puff Inhalation Q6H  . latanoprost  1 drop Both Eyes QHS  . linagliptin  5 mg Oral Daily  . methylPREDNISolone (SOLU-MEDROL) injection  40 mg Intravenous Q12H  . pantoprazole  40 mg Oral Q1200  . prednisoLONE acetate  1 drop Right Eye BID  . sodium bicarbonate  1,300 mg Oral BID  . sodium chloride flush  3 mL Intravenous Once  . zinc sulfate  220 mg Oral Daily    BMET    Component Value Date/Time   NA 139 01/19/2020 0534   NA 141 01/18/2017 1209   K 5.3 (H) 01/19/2020 0534   CL 109 01/19/2020 0534   CO2 18 (L) 01/19/2020 0534   GLUCOSE 227 (H) 01/19/2020 0534   BUN 100 (H) 01/19/2020 0534   BUN 32 (H) 01/18/2017 1209   CREATININE 2.74 (H) 01/19/2020 0534   CREATININE 1.19 (H) 01/22/2013 1949   CALCIUM 8.6 (L) 01/19/2020 0534   GFRNONAA 18 (L) 01/19/2020 0534   GFRAA 21 (L) 01/19/2020 0534    CBC    Component Value Date/Time   WBC 19.9 (H) 01/19/2020 0534   RBC 3.87 01/19/2020 0534   HGB 11.6 (L) 01/19/2020 0534   HCT 36.9 01/19/2020 0534   PLT 361 01/19/2020 0534   MCV 95.3 01/19/2020 0534   MCH 30.0 01/19/2020 0534   MCHC 31.4 01/19/2020 0534   RDW 17.2 (H) 01/19/2020 0534   LYMPHSABS 1.5 01/18/2020 0702   MONOABS 1.3 (H) 01/18/2020 0702   EOSABS 0.0 01/18/2020 0702   BASOSABS 0.0 01/18/2020 0702     Assessment/Plan:  1. AKI/CKD stage IIIa (baseline Scr 1.5)- multi-factorial in setting of covid pneumonia, IV contrast, acute on chronic diastolic disfunction.  Scr has improved but BUN rising, likely due to IV steroids. 2. Acute on chronic diastolic CHF- ok to dose with lasix 80 mg again today given rising K, edema, and SOB.  Discussed with Dr. Sloan Leiter. ECHO with pulmonary HTN and RV dysfunction.  Likely OSA  3. Covid PNA- s/p actemra and on remdesivir and steroids. 4. Hyperkalemia- due to #1.  Ok to give lasix and follow 5. Metabolic acidosis- due to #1.  Follow after IV lasix. 6. PAD s/p stent to left SFA on 01/08/20 with bump in Scr related to CIN.  7. HTN- BP stable. Lisinopril on hold.  Lasix today.   [name], MD [employer] [phone]

## 2020-01-19 NOTE — Progress Notes (Signed)
Pt has order for CPAP. Due to COVID pt cannot wear CPAP.

## 2020-01-20 LAB — CBC
HCT: 33.5 % — ABNORMAL LOW (ref 36.0–46.0)
Hemoglobin: 11.1 g/dL — ABNORMAL LOW (ref 12.0–15.0)
MCH: 30.3 pg (ref 26.0–34.0)
MCHC: 33.1 g/dL (ref 30.0–36.0)
MCV: 91.5 fL (ref 80.0–100.0)
Platelets: 355 10*3/uL (ref 150–400)
RBC: 3.66 MIL/uL — ABNORMAL LOW (ref 3.87–5.11)
RDW: 16.7 % — ABNORMAL HIGH (ref 11.5–15.5)
WBC: 21.4 10*3/uL — ABNORMAL HIGH (ref 4.0–10.5)
nRBC: 0.9 % — ABNORMAL HIGH (ref 0.0–0.2)

## 2020-01-20 LAB — GLUCOSE, CAPILLARY
Glucose-Capillary: 143 mg/dL — ABNORMAL HIGH (ref 70–99)
Glucose-Capillary: 185 mg/dL — ABNORMAL HIGH (ref 70–99)
Glucose-Capillary: 279 mg/dL — ABNORMAL HIGH (ref 70–99)
Glucose-Capillary: 270 mg/dL — ABNORMAL HIGH (ref 70–99)

## 2020-01-20 LAB — COMPREHENSIVE METABOLIC PANEL
ALT: 36 U/L (ref 0–44)
AST: 59 U/L — ABNORMAL HIGH (ref 15–41)
Albumin: 2.7 g/dL — ABNORMAL LOW (ref 3.5–5.0)
Alkaline Phosphatase: 67 U/L (ref 38–126)
Anion gap: 14 (ref 5–15)
BUN: 96 mg/dL — ABNORMAL HIGH (ref 8–23)
CO2: 20 mmol/L — ABNORMAL LOW (ref 22–32)
Calcium: 8.7 mg/dL — ABNORMAL LOW (ref 8.9–10.3)
Chloride: 107 mmol/L (ref 98–111)
Creatinine, Ser: 2.34 mg/dL — ABNORMAL HIGH (ref 0.44–1.00)
GFR calc Af Amer: 25 mL/min — ABNORMAL LOW (ref >60)
GFR calc non Af Amer: 21 mL/min — ABNORMAL LOW (ref >60)
Glucose, Bld: 155 mg/dL — ABNORMAL HIGH (ref 70–99)
Potassium: 5.3 mmol/L — ABNORMAL HIGH (ref 3.5–5.1)
Sodium: 141 mmol/L (ref 135–145)
Total Bilirubin: 1 mg/dL (ref 0.3–1.2)
Total Protein: 5.7 g/dL — ABNORMAL LOW (ref 6.5–8.1)

## 2020-01-20 LAB — D-DIMER, QUANTITATIVE: D-Dimer, Quant: 4.79 ug/mL-FEU — ABNORMAL HIGH (ref 0.00–0.50)

## 2020-01-20 LAB — C-REACTIVE PROTEIN: CRP: 2.5 mg/dL — ABNORMAL HIGH (ref <1.0)

## 2020-01-20 MED ORDER — SODIUM ZIRCONIUM CYCLOSILICATE 10 G PO PACK
10.0000 g | PACK | Freq: Every day | ORAL | Status: DC
  Administered 2020-01-20 – 2020-01-22 (×3): 10 g via ORAL
  Filled 2020-01-20 (×4): qty 1

## 2020-01-20 MED ORDER — FUROSEMIDE 10 MG/ML IJ SOLN
80.0000 mg | Freq: Once | INTRAMUSCULAR | Status: AC
  Administered 2020-01-20: 80 mg via INTRAVENOUS
  Filled 2020-01-20: qty 8

## 2020-01-20 NOTE — Progress Notes (Signed)
Unable to get strict I&Os as ordered due to Arbuckle not functioning properly and patient not notifying nursing staff when she needs to void. Pt has had 3 very large episodes of urine incontinence which has saturated 2 bed pads each time.

## 2020-01-20 NOTE — Progress Notes (Signed)
Pt states she cannot tolerate the hospital CPAP.  Pt stated she will wear 02 if needed throughout the night.  Pt currently on RA SpO2 of 100%, no distress noted.

## 2020-01-20 NOTE — Progress Notes (Addendum)
PROGRESS NOTE                                                                                                                                                                                                             Patient Demographics:    [name], is a 64 y.o. female, DOB - [birth year], [id number]  Outpatient Primary MD for the patient is Pavelock, Ralene Bathe, MD   Admit date - 12/24/2019   LOS - 7  Chief Complaint  Patient presents with  . Shortness of Breath  . Chest Pain       Brief Narrative: Patient is a 64 y.o. female  PMHx of hypertension, HLD, DM-2, CKD stage IV, PVD s/p stent to left distal SFA on [id number], chronic diastolic heart failure-presented with shortness of breath or cough-found to have acute hypoxic respiratory failure secondary to COVID-19 pneumonia and decompensated diastolic heart failure.  Significant Events: 4/18-4/23>> admit to [facility]-s/p stent to left distal SFA 4/27>> admit to [facility] for hypoxia from Covid/heart failure 4/27>> lower extremity Dopplers negative for DVT. [id number] grade 2 diastolic dysfunction, moderate RV systolic dysfunction.  RVSP 62.6 mmHg. 4/30>> VQ scan-low probability PE  COVID-19 medications: Steroids: 4/27>> Remdesivir: 4/27>> 5/1  Antibiotics: Vancomycin: 4/27 x 1 Cefepime: 4/27 x 1  Microbiology data: 4/27: Blood culture>> no growth 4/28: Blood culture>> no growth  DVT prophylaxis: SQ heparin  Procedures: None  Consults: None    Subjective:   Feels a lot better today-Down to 3 L of oxygen this morning.   Assessment  & Plan :   Acute Hypoxic Resp Failure due to Covid 19 Viral pneumonia and decompensated diastolic heart failure: Much better this morning-down to 3 L of oxygen-appears much more comfortable.  Continue supportive care-VQ scan without PE, lower extremity Dopplers negative.  Continued attempts to slowly titrate down FiO2.    COVID-19  pneumonia: Hypoxia has improved-has completed a course of remdesivir, s/p Actemra on 4/30-remains on steroids.  CRP downtrending.  Continue to follow-continue supportive care.   Fever: afebrile  O2 requirements:  SpO2: 96 % O2 Flow Rate (L/min): 3 L/min   COVID-19 Labs: Recent Labs    01/18/20 0702 01/19/20 0534 01/20/20 0547  DDIMER 4.13* 4.50* 4.79*  FERRITIN 1,823*  --   --   CRP 5.1* 3.6* 2.5*       Component  Value Date/Time   BNP 134.9 (H) 01/17/2020 0831    Recent Labs  Lab 01/13/20 2200  PROCALCITON 0.21    Lab Results  Component Value Date   SARSCOV2NAA POSITIVE (A) 01/13/2020   [place] NEGATIVE 01/05/2020   [place] Not Detected 11/24/2019     Prone/Incentive Spirometry: encouraged  incentive spirometry use 3-4/hour.  Decompensated diastolic heart failure: Volume status improved compared to admission-although she has less edema-continues to be slightly volume overloaded.  Will repeat Lasix 80 mg IV x1 today.  Continue to follow weights, electrolytes and volume status closely.   Pulmonary hypertension/RV dysfunction: Not seen in prior echo's-I suspect this is probably from OSA-she has been noncompliant to CPAP, but given her worsening hypoxemia-she underwent a VQ scan-which was low probability for PE.  Lower extremity Dopplers negative.  Briefly on IV heparin-but have switched back to prophylactic dosing.   AKI on CKD stage IV: Improving-AKI likely hemodynamically mediated.  Renal ultrasound without any hydronephrosis.  Remains slightly volume overloaded-repeat Lasix 80 mg IV x1.  Nephrology following.    Acute metabolic encephalopathy: More awake and alert-much more fluid and answers.  Likely secondary to hypoxemia and AKI.  Continue supportive care.   Elevated troponin: Trend is flat-not consistent with ACS-likely secondary to underlying CKD-with some component from demand ischemia due to COVID-19/heart failure.  PAD-s/p stent to left SFA on 4/22:  Both lower extremities appear warm-continue aspirin, Plavix and statin.  HTN: BP stable-continue Cardura-Lasix and lisinopril on hold.    DM-2 with hyperglycemia (A1c 12.2 on 4/19): CBGs relatively stable-continue Lantus 26 units twice daily-SSI.  Follow and optimize.  Recent Labs    01/19/20 1744 01/19/20 2046 01/20/20 0830  GLUCAP 162* 166* 143*    Thrombocytopenia: Resolved.  Leukocytosis: Secondary to steroids-no evidence of any superimposed bacterial infection.  Anemia of chronic disease: Close to baseline-likely related to CKD.  Gout: No flare-hold colchicine for now given AKI.  OSA: Noncompliant to CPAP-continue CPAP nightly if possible in the hospital.  Obesity: Estimated body mass index is 44.65 kg/m as calculated from the following:   Height as of this encounter: 5\' 1"  (1.549 m).   Weight as of this encounter: 107.2 kg.   ABG:    Component Value Date/Time   PHART 7.460 (H) 09/30/2008 1120   PCO2ART 41.0 09/30/2008 1120   PO2ART 83.0 09/30/2008 1120   HCO3 26.4 12/06/2016 0922   HCO3 26.1 12/06/2016 0922   TCO2 28 12/06/2016 0922   TCO2 28 12/06/2016 0922   O2SAT 69.0 12/06/2016 0922   O2SAT 67.0 12/06/2016 0922    Vent Settings: N/A  Condition - Extremely Guarded  Family Communication  : Spoke with daughter-5/4  Code Status :  Full Code  Diet :  Diet Order            Diet heart healthy/carb modified Room service appropriate? Yes; Fluid consistency: Thin; Fluid restriction: 2000 mL Fluid  Diet effective now               Disposition Plan  :   Status is: Inpatient  Remains inpatient appropriate because:Inpatient level of care appropriate due to severity of illness  Dispo: The patient is from: Home              Anticipated d/c is to: Home              Anticipated d/c date is: > 3 days              Patient currently is  not medically stable to d/c.  Barriers to discharge: Hypoxia requiring O2 supplementation/ AKI  Antimicorbials  :     Anti-infectives (From admission, onward)   Start     Dose/Rate Route Frequency Ordered Stop   01/15/20 1200  vancomycin (VANCOREADY) IVPB 1250 mg/250 mL  Status:  Discontinued     1,250 mg 166.7 mL/hr over 90 Minutes Intravenous Every 48 hours 01/13/20 1018 01/14/20 1038   01/14/20 1000  remdesivir 100 mg in sodium chloride 0.9 % 100 mL IVPB     100 mg 200 mL/hr over 30 Minutes Intravenous Daily 01/13/20 1129 01/17/20 1113   01/13/20 1630  valACYclovir (VALTREX) tablet 1,000 mg  Status:  Discontinued     1,000 mg Oral 3 times daily 01/13/20 1557 01/17/20 1147   01/13/20 1230  remdesivir 200 mg in sodium chloride 0.9% 250 mL IVPB     200 mg 580 mL/hr over 30 Minutes Intravenous Once 01/13/20 1129 01/13/20 1800   01/13/20 1015  ceFEPIme (MAXIPIME) 2 g in sodium chloride 0.9 % 100 mL IVPB     2 g 200 mL/hr over 30 Minutes Intravenous  Once 01/13/20 1003 01/13/20 1434   01/13/20 1015  vancomycin (VANCOREADY) IVPB 2000 mg/400 mL     2,000 mg 200 mL/hr over 120 Minutes Intravenous  Once 01/13/20 1012 01/13/20 2258      Inpatient Medications  Scheduled Meds: . vitamin C  500 mg Oral Daily  . aspirin EC  81 mg Oral Q breakfast  . atorvastatin  40 mg Oral Daily  . brimonidine  1 drop Both Eyes BID  . clopidogrel  75 mg Oral Daily  . dorzolamide-timolol  1 drop Both Eyes BID  . doxazosin  4 mg Oral QHS  . heparin injection (subcutaneous)  5,000 Units Subcutaneous Q8H  . insulin aspart  0-20 Units Subcutaneous TID WC  . insulin aspart  0-5 Units Subcutaneous QHS  . insulin glargine  26 Units Subcutaneous BID  . Ipratropium-Albuterol  1 puff Inhalation Q6H  . latanoprost  1 drop Both Eyes QHS  . linagliptin  5 mg Oral Daily  . methylPREDNISolone (SOLU-MEDROL) injection  40 mg Intravenous Q12H  . pantoprazole  40 mg Oral Q1200  . prednisoLONE acetate  1 drop Right Eye BID  . sodium bicarbonate  1,300 mg Oral BID  . sodium chloride flush  3 mL Intravenous Once  . zinc sulfate  220  mg Oral Daily   Continuous Infusions:  PRN Meds:.acetaminophen, chlorpheniramine-HYDROcodone, guaiFENesin-dextromethorphan, ondansetron **OR** ondansetron (ZOFRAN) IV, oxyCODONE-acetaminophen   Time Spent in minutes  25  See all Orders from today for further details   Oren Binet M.D on 01/20/2020 at 11:37 AM  To page go to [email or web address] - use universal password  [facility] -  Office  [phone]    Objective:   Vitals:   01/19/20 2000 01/20/20 0000 01/20/20 0437 01/20/20 0908  BP: (!) 146/55 129/65 (!) 155/56 (!) 153/69  Pulse: 67 (!) 51 (!) 52 64  Resp: 18 20 18  (!) 21  Temp: 98.1 F (36.7 C) 98 F (36.7 C) 97.8 F (36.6 C) 97.8 F (36.6 C)  TempSrc: Oral Axillary Axillary Axillary  SpO2: 93% 99% 99% 96%  Weight:   107.2 kg   Height:        Wt Readings from Last 3 Encounters:  01/20/20 107.2 kg  01/08/20 115.6 kg  07/15/19 102.9 kg     Intake/Output Summary (Last 24 hours) at 01/20/2020 1137 Last data filed at  01/20/2020 1124 Gross per 24 hour  Intake 600 ml  Output 800 ml  Net -200 ml    Physical Exam Gen Exam:Alert awake-not in any distress HEENT:atraumatic, normocephalic Chest: B/L clear to auscultation anteriorly CVS:S1S2 regular Abdomen:soft non tender, non distended Extremities:+ edema Neurology: Non focal Skin: no rash   Data Review:    CBC Recent Labs  Lab 01/14/20 0307 01/14/20 [id number] 01/15/20 0528 01/15/20 0528 01/16/20 [id number] 01/17/20 0831 01/18/20 0702 01/19/20 0534 01/20/20 0547  WBC 5.1   < > 9.1   < > 14.7* 16.8* 20.4* 19.9* 21.4*  HGB 10.0*   < > 10.1*   < > 10.0* 10.8* 11.3* 11.6* 11.1*  HCT 31.4*   < > 31.2*   < > 30.9* 32.7* 34.2* 36.9 33.5*  PLT 143*   < > 208   < > 273 323 336 361 355  MCV 94.6   < > 92.0   < > 92.2 90.1 91.2 95.3 91.5  MCH 30.1   < > 29.8   < > 29.9 29.8 30.1 30.0 30.3  MCHC 31.8   < > 32.4   < > 32.4 33.0 33.0 31.4 33.1  RDW 17.1*   < > 16.9*   < > 16.9* 16.7* 16.9* 17.2* 16.7*  LYMPHSABS  0.5*  --  1.0  --  1.3 1.4 1.5  --   --   MONOABS 0.2  --  0.4  --  0.8 1.1* 1.3*  --   --   EOSABS 0.0  --  0.0  --  0.0 0.0 0.0  --   --   BASOSABS 0.0  --  0.0  --  0.0 0.0 0.0  --   --    < > = values in this interval not displayed.    Chemistries  Recent Labs  Lab 01/14/20 0307 01/14/20 0307 01/15/20 0528 01/15/20 0528 01/16/20 [id number] 01/17/20 0831 01/18/20 0702 01/19/20 0534 01/20/20 0547  NA 140   < > 140   < > 140 140 138 139 141  K 4.5   < > 4.7   < > 4.7 4.5 4.8 5.3* 5.3*  CL 109   < > 109   < > 109 109 107 109 107  CO2 19*   < > 19*   < > 20* 19* 18* 18* 20*  GLUCOSE 196*   < > 253*   < > 217* 159* 178* 227* 155*  BUN 44*   < > 63*   < > 81* 94* 101* 100* 96*  CREATININE 2.08*   < > 2.48*   < > 2.96* 3.11* 2.98* 2.74* 2.34*  CALCIUM 8.1*   < > 8.2*   < > 8.3* 8.6* 8.7* 8.6* 8.7*  MG 1.6*  --  2.0  --  2.1 2.4 2.4  --   --   AST 62*   < > 57*   < > 60* 69* 61* 63* 59*  ALT 34   < > 31   < > 32 43 38 41 36  ALKPHOS 38   < > 47   < > 59 71 73 70 67  BILITOT 0.8   < > 0.6   < > 0.9 0.9 0.8 1.0 1.0   < > = values in this interval not displayed.   ------------------------------------------------------------------------------------------------------------------ No results for input(s): CHOL, HDL, LDLCALC, TRIG, CHOLHDL, LDLDIRECT in the last 72 hours.  Lab Results  Component Value Date   HGBA1C 12.2 (H) 01/05/2020   ------------------------------------------------------------------------------------------------------------------  No results for input(s): TSH, T4TOTAL, T3FREE, THYROIDAB in the last 72 hours.  Invalid input(s): FREET3 ------------------------------------------------------------------------------------------------------------------ Recent Labs    01/18/20 0702  FERRITIN 1,823*    Coagulation profile No results for input(s): INR, PROTIME in the last 168 hours.  Recent Labs    01/19/20 0534 01/20/20 0547  DDIMER 4.50* 4.79*    Cardiac  Enzymes No results for input(s): CKMB, TROPONINI, MYOGLOBIN in the last 168 hours.  Invalid input(s): CK ------------------------------------------------------------------------------------------------------------------    Component Value Date/Time   BNP 134.9 (H) 01/17/2020 0831    Micro Results Recent Results (from the past 240 hour(s))  Respiratory Panel by RT PCR (Flu A&B, Covid) - Nasopharyngeal Swab     Status: Abnormal   Collection Time: 01/13/20 10:25 AM   Specimen: Nasopharyngeal Swab  Result Value Ref Range Status   SARS Coronavirus 2 by RT PCR POSITIVE (A) NEGATIVE Final    Comment: RESULT CALLED TO, READ BACK BY AND VERIFIED WITH: Sheran Lawless RN 12:25 01/13/20 ([email or web address]) (NOTE) SARS-CoV-2 target nucleic acids are DETECTED. SARS-CoV-2 RNA is generally detectable in upper respiratory specimens  during the acute phase of infection. Positive results are indicative of the presence of the identified virus, but do not rule out bacterial infection or co-infection with other pathogens not detected by the test. Clinical correlation with patient history and other diagnostic information is necessary to determine patient infection status. The expected result is Negative. Fact Sheet for Patients:  PinkCheek.be Fact Sheet for Healthcare Providers: GravelBags.it This test is not yet approved or cleared by the [place] FDA and  has been authorized for detection and/or diagnosis of SARS-CoV-2 by FDA under an Emergency Use Authorization (EUA).  This EUA will remain in effect (meaning this test can be used)  for the duration of  the COVID-19 declaration under Section 564(b)(1) of the Act, 21 U.S.C. section 360bbb-3(b)(1), unless the authorization is terminated or revoked sooner.    Influenza A by PCR NEGATIVE NEGATIVE Final   Influenza B by PCR NEGATIVE NEGATIVE Final    Comment: (NOTE) The Xpert Xpress SARS-CoV-2/FLU/RSV  assay is intended as an aid in  the diagnosis of influenza from Nasopharyngeal swab specimens and  should not be used as a sole basis for treatment. Nasal washings and  aspirates are unacceptable for Xpert Xpress SARS-CoV-2/FLU/RSV  testing. Fact Sheet for Patients: PinkCheek.be Fact Sheet for Healthcare Providers: GravelBags.it This test is not yet approved or cleared by the [place] FDA and  has been authorized for detection and/or diagnosis of SARS-CoV-2 by  FDA under an Emergency Use Authorization (EUA). This EUA will remain  in effect (meaning this test can be used) for the duration of the  Covid-19 declaration under Section 564(b)(1) of the Act, 21  U.S.C. section 360bbb-3(b)(1), unless the authorization is  terminated or revoked. Performed at [facility], [place] [address] [place], [place] [address]   Blood culture (routine x 2)     Status: None   Collection Time: 01/13/20 11:10 AM   Specimen: BLOOD  Result Value Ref Range Status   Specimen Description BLOOD LEFT ANTECUBITAL  Final   Special Requests   Final    BOTTLES DRAWN AEROBIC AND ANAEROBIC Blood Culture results may not be optimal due to an inadequate volume of blood received in culture bottles   Culture   Final    NO GROWTH 5 DAYS Performed at [facility], [place] [address], [place], [place] [address]    Report Status 01/18/2020  FINAL  Final  Blood culture (routine x 2)     Status: None   Collection Time: 01/14/20  8:51 AM   Specimen: BLOOD LEFT HAND  Result Value Ref Range Status   Specimen Description BLOOD LEFT HAND  Final   Special Requests   Final    BOTTLES DRAWN AEROBIC AND ANAEROBIC Blood Culture results may not be optimal due to an inadequate volume of blood received in culture bottles   Culture   Final    NO GROWTH 5 DAYS Performed at [facility], [place] [address], [place], [place] [address]    Report Status 01/19/2020 FINAL   Final    Radiology Reports DG Chest 2 View  Result Date: 01/13/2020 CLINICAL DATA:  Shortness of breath and bilateral leg swelling. EXAM: CHEST - 2 VIEW COMPARISON:  November 30, 2016 FINDINGS: Moderate severity diffuse multifocal infiltrates are seen, bilaterally. There is no evidence of a pleural effusion or pneumothorax. The cardiac silhouette is mildly enlarged. A radiopaque fusion plate and screws are seen overlying the lower cervical spine. The visualized skeletal structures are otherwise unremarkable. Radiopaque surgical clips are seen within the right upper quadrant. IMPRESSION: Moderate severity bilateral multifocal infiltrates. Electronically Signed   By: Virgina Norfolk M.D.   On: 01/13/2020 00:27   NM Pulmonary Perfusion  Result Date: 01/16/2020 CLINICAL DATA:  Shortness of breath, history of COVID-19 positivity EXAM: NUCLEAR MEDICINE PERFUSION LUNG SCAN TECHNIQUE: Perfusion images were obtained in multiple projections after intravenous injection of radiopharmaceutical. Ventilation scans intentionally deferred if perfusion scan and chest x-ray adequate for interpretation during COVID 19 epidemic. RADIOPHARMACEUTICALS:  1.6 mCi Tc-87m MAA IV COMPARISON:  Chest x-ray from earlier in the same day. FINDINGS: Perfusion images demonstrate adequate uptake throughout both lungs despite the changes seen on recent chest x-ray. No focal defect to suggest pulmonary embolism is noted. IMPRESSION: No evidence of pulmonary embolism. Electronically Signed   By: Inez Catalina M.D.   On: 01/16/2020 16:23   US RENAL  Result Date: 01/16/2020 CLINICAL DATA:  Acute renal insufficiency. EXAM: RENAL / URINARY TRACT ULTRASOUND COMPLETE COMPARISON:  None. FINDINGS: Exam difficult due to patient body habitus. Right Kidney: Renal measurements: 9.4 x 4.2 x 4.5 cm = volume: 94 mL . Echogenicity within normal limits. Mild cortical thinning. No mass or hydronephrosis visualized. Left Kidney: Renal measurements: 10.1 x 5.2 x  5.5 cm = volume: 150 mL. Echogenicity within normal limits. Mild cortical thinning. No mass or hydronephrosis visualized. Bladder: Not visualized. Other: None. IMPRESSION: Normal size kidneys without hydronephrosis. Electronically Signed   By: Marin Olp M.D.   On: 01/16/2020 16:38   US RENAL  Result Date: 01/06/2020 CLINICAL DATA:  Chronic kidney disease EXAM: RENAL / URINARY TRACT ULTRASOUND COMPLETE COMPARISON:  None. FINDINGS: Right Kidney: Renal measurements: 9.3 x 4.3 x 4.6 cm = volume: 96 mL. Lobulated contour. No hydronephrosis. Normal echogenicity. Left Kidney: Renal measurements: 10.7 x 5.0 x 5.2 cm = volume: 145 mL. Lobulated contour without hydronephrosis. Normal echogenicity. Bladder: Decompressed. Other: Visualization is generally limited by body habitus. IMPRESSION: No hydronephrosis. Lobulated renal contours without focal parenchymal abnormality. Electronically Signed   By: Ulyses Jarred M.D.   On: 01/06/2020 16:16   PERIPHERAL VASCULAR CATHETERIZATION  Result Date: 01/08/2020 Patient name: Felisia Uhlmann   MRN: [id number]        DOB: 10-Nov-1955            Sex: female  01/08/2020 Pre-operative Diagnosis: Critical limb ischemia of the left lower extremity  with tissue loss Post-operative diagnosis:  Same Surgeon:  Marty Heck, MD Procedure Performed: 1.  Ultrasound guided access of the right common femoral artery 2.  Aortogram with CO2 including catheter selection of the aorta 3.  Left lower extremity arteriogram with selection of third order branches 4.  Left distal SFA above-knee popliteal artery angioplasty with stent placement (primarily stented with a 6 mm x 60 mm drug-coated Eluvia postdilated with a 5 mm Mustang) 5.  Left anterior tibial angioplasty for focal perforation with resolution of the perforation (3 mm Sterling) 6.  Mynx closure of the right common femoral artery 7.  55 minutes of monitored moderate conscious sedation time   Indications: Patient is a 64 year old female  who was seen in consultation by Dr. Trula Slade for left lower extremity rest pain with severely depressed ABIs and duplex showing likely popliteal disease.  She presents today for left lower extremity arteriogram after risk benefits discussed.  Findings:  Aortogram was performed with CO2 to limit contrast and there was no flow-limiting aortoiliac stenosis.  Left lower extremity arteriogram showed a patent common femoral profunda and widely patent SFA in the proximal to mid segment.  At [facility] canal in the distal SFA and above-knee popliteal artery showed a very short focal occlusion with large collateral around this.  She had a patent below-knee popliteal artery with three-vessel runoff.  Anterior tibial appeared to be dominant runoff.   Ultimately this lesion was crossed in the distal SFA/above knee popliteal artery and primarily stented with a 6 mm x 60 mm Eluvia.  She now has excellent inline flow down the left lower extremity with no residual stenosis and three-vessel runoff.  There was a focal perforation in the anterior tibial where our wire likely made a perforation during exchange of balloons and stents.  Ultimately this was treated with a very low inflation of a 3 mm Sterling for 3 minutes with resolution.  Patient has preserved three vessel runoff.              Procedure:  The patient was identified in the holding area and taken to room 8.  The patient was then placed supine on the table and prepped and draped in the usual sterile fashion.  A time out was called.  Ultrasound was used to evaluate the right common femoral artery.  It was patent .  A digital ultrasound image was acquired.  A micropuncture needle was used to access the right common femoral artery under ultrasound guidance.  An 018 wire was advanced without resistance and a micropuncture sheath was placed.  The 018 wire was removed and a benson wire was placed.  The micropuncture sheath was exchanged for a 5 french sheath.  An omniflush  catheter was advanced over the wire to the level of L-1.  An abdominal angiogram was obtained.  Next, using glidewire and straight 4 French flush catheter the aortic bifurcation was cross because the Omni would not track.  Placed the catheter into theleft external iliac artery and left runoff was obtained.  Ultimately after evaluating the pictures elected to intervene on the distal left SFA above-knee popliteal occlusion.  Used a long Glidewire advantage down the SFA and exchanged for a 6 [place] Ansell sheath in the right groin over the aortic bifurcation.  Patient was given 100 units per kilogram heparin.  I then primarily stented this lesion with a 6 mm x 60 mm Eluvia that was postdilated with a 5 mm x 60 mm Mustang.  Final  pictures showed no residual stenosis with excellent inline flow down the left lower extremity and three-vessel runoff.  There was a focal perforation in the proximal anterior tibial that I think was from our wire.  I then exchanged for a V 18 wire down the anterior tibial and then did a very slow inflation with a 3 mm Sterling in the left anterior tibial artery for 3 minutes.  There appeared to be resolution of the perforation after taking multiple additional pictures.  Wires and catheters removed and a short 6 French sheath was placed in the right groin.  We placed a mynx closure device.   Plan: Patient will be loaded on Plavix and will need to continue aspirin and Plavix.  Should have inline flow down left leg now.   Marty Heck, MD [facility] Office: [phone]   DG Chest Port 1V same Day  Result Date: 01/16/2020 CLINICAL DATA:  Shortness of breath.  COVID. EXAM: PORTABLE CHEST 1 VIEW COMPARISON:  Three days ago FINDINGS: More confluent bilateral interstitial and airspace opacity. Cardiomegaly. No visible effusion or pneumothorax. IMPRESSION: Worsening bilateral pneumonia. Electronically Signed   By: Monte Fantasia M.D.   On: 01/16/2020  08:34   DG Foot Complete Left  Result Date: 01/05/2020 CLINICAL DATA:  Swelling EXAM: LEFT FOOT - COMPLETE 3+ VIEW COMPARISON:  None. FINDINGS: Degenerative changes in the 1st MTP joint. No acute bony abnormality. Specifically, no fracture, subluxation, or dislocation. Vascular calcifications noted. No bone destruction seen to suggest osteomyelitis. IMPRESSION: No acute bony abnormality. Electronically Signed   By: Rolm Baptise M.D.   On: 01/05/2020 01:25   VAS [place] ABI WITH/WO TBI  Result Date: 01/05/2020 LOWER EXTREMITY DOPPLER STUDY Indications: Blue, cold toes. High Risk Factors: Hypertension, Diabetes.  Limitations: Today's exam was limited due to Patient body habitus. Comparison Study: No prior studies. Performing Technologist: Carlos Levering Rvt  Examination Guidelines: A complete evaluation includes at minimum, Doppler waveform signals and systolic blood pressure reading at the level of bilateral brachial, anterior tibial, and posterior tibial arteries, when vessel segments are accessible. Bilateral testing is considered an integral part of a complete examination. Photoelectric Plethysmograph (PPG) waveforms and toe systolic pressure readings are included as required and additional duplex testing as needed. Limited examinations for reoccurring indications may be performed as noted.  ABI Findings: +---------+------------------+-----+---------+--------+ Right    Rt Pressure (mmHg)IndexWaveform Comment  +---------+------------------+-----+---------+--------+ Brachial 150                    triphasic         +---------+------------------+-----+---------+--------+ PTA      108               0.72 biphasic          +---------+------------------+-----+---------+--------+ DP       137               0.91 triphasic         +---------+------------------+-----+---------+--------+ Great Toe76                0.51                   +---------+------------------+-----+---------+--------+  +---------+------------------+-----+----------+-------+ Left     Lt Pressure (mmHg)IndexWaveform  Comment +---------+------------------+-----+----------+-------+ Brachial 136                    triphasic         +---------+------------------+-----+----------+-------+ PTA      66  0.44 monophasic        +---------+------------------+-----+----------+-------+ DP       53                0.35 monophasic        +---------+------------------+-----+----------+-------+ Great Toe0                 0.00                   +---------+------------------+-----+----------+-------+ +-------+-----------+-----------+------------+------------+ ABI/TBIToday's ABIToday's TBIPrevious ABIPrevious TBI +-------+-----------+-----------+------------+------------+ Right  0.91       0.51                                +-------+-----------+-----------+------------+------------+ Left   0.44       0                                   +-------+-----------+-----------+------------+------------+  Summary: Right: Resting right ankle-brachial index indicates mild right lower extremity arterial disease. The right toe-brachial index is abnormal. Left: Resting left ankle-brachial index indicates severe left lower extremity arterial disease. The left toe-brachial index is abnormal.  *See table(s) above for measurements and observations.  Electronically signed by [name] MD on 01/05/2020 at 7:35:40 PM.    Final    VAS [place] LOWER EXTREMITY ARTERIAL DUPLEX  Result Date: 01/05/2020 LOWER EXTREMITY ARTERIAL DUPLEX STUDY Indications: Blue, cold toes. High Risk Factors: Hypertension, Diabetes.  Current ABI: R 0.91 L 0.44 Limitations: Patient body habitus, poor ultrasound/tissue interface. Comparison Study: No prior studies. Performing Technologist: Oliver Hum RVT  Examination Guidelines: A complete evaluation includes B-mode imaging, spectral Doppler, color Doppler, and power Doppler as  needed of all accessible portions of each vessel. Bilateral testing is considered an integral part of a complete examination. Limited examinations for reoccurring indications may be performed as noted.  +-----------+--------+-----+--------+----------+------------------+ LEFT       PSV cm/sRatioStenosisWaveform  Comments           +-----------+--------+-----+--------+----------+------------------+ EIA Distal 116                  biphasic                     +-----------+--------+-----+--------+----------+------------------+ CFA Mid    132                  biphasic                     +-----------+--------+-----+--------+----------+------------------+ DFA        111                  biphasic                     +-----------+--------+-----+--------+----------+------------------+ SFA Prox   104                  biphasic                     +-----------+--------+-----+--------+----------+------------------+ SFA Mid    85                   biphasic                     +-----------+--------+-----+--------+----------+------------------+ SFA Distal 91  biphasic                     +-----------+--------+-----+--------+----------+------------------+ POP Prox   26                   monophasic                   +-----------+--------+-----+--------+----------+------------------+ POP Distal 0                              Unable to insonate +-----------+--------+-----+--------+----------+------------------+ ATA Prox   73                   biphasic                     +-----------+--------+-----+--------+----------+------------------+ ATA Mid    33                   monophasic                   +-----------+--------+-----+--------+----------+------------------+ ATA Distal 32                   monophasic                   +-----------+--------+-----+--------+----------+------------------+ PTA Prox                                   Unable to insonate +-----------+--------+-----+--------+----------+------------------+ PTA Mid    27                   monophasic                   +-----------+--------+-----+--------+----------+------------------+ PTA Distal 9                    monophasic                   +-----------+--------+-----+--------+----------+------------------+ PERO Prox                                 Unable to insonate +-----------+--------+-----+--------+----------+------------------+ PERO Mid                                  Unable to insonate +-----------+--------+-----+--------+----------+------------------+ PERO Distal15                   monophasic                   +-----------+--------+-----+--------+----------+------------------+ DP         29                   monophasic                   +-----------+--------+-----+--------+----------+------------------+  Summary: Left: The external iliac, common femoral, and superficial femoral arteries appear patent with no significant stenosis. Monophasic waveforms are noted in the proximal popliteal artery with poor visualization of the distal segment. Which suggests a possible occlusion. Anterior tibial artery appears patent throughout with monophasic flow. The posterior tibial, and peroneal arteries demonstrated monophasic flow with likely areas of occlusive disease. The proximal calf arteries were difficult to visualize.  See table(s) above for measurements and  observations. Electronically signed by [name] MD on 01/05/2020 at 31:34:41 PM.    Final    VAS [place] LOWER EXTREMITY VENOUS (DVT)  Result Date: 01/13/2020  Lower Venous DVTStudy Indications: Swelling.  Limitations: Body habitus and poor ultrasound/tissue interface. Performing Technologist: Antonieta Pert RDMS, RVT  Examination Guidelines: A complete evaluation includes B-mode imaging, spectral Doppler, color Doppler, and power Doppler as needed of all accessible portions of  each vessel. Bilateral testing is considered an integral part of a complete examination. Limited examinations for reoccurring indications may be performed as noted. The reflux portion of the exam is performed with the patient in reverse Trendelenburg.  +---------+---------------+---------+-----------+----------+-------------------+ RIGHT    CompressibilityPhasicitySpontaneityPropertiesThrombus Aging      +---------+---------------+---------+-----------+----------+-------------------+ CFV      Partial        Yes      Yes                  patient body                                                              habitus and                                                               position limiting                                                         evaluation          +---------+---------------+---------+-----------+----------+-------------------+ SFJ      Full                                                             +---------+---------------+---------+-----------+----------+-------------------+ FV Prox                          Yes                                      +---------+---------------+---------+-----------+----------+-------------------+ FV Mid                           Yes                                      +---------+---------------+---------+-----------+----------+-------------------+ FV Distal                        Yes                                      +---------+---------------+---------+-----------+----------+-------------------+  PFV      Full                                                             +---------+---------------+---------+-----------+----------+-------------------+ POP      Full                                                             +---------+---------------+---------+-----------+----------+-------------------+ PTV                                                   not clearly                                                                visualized          +---------+---------------+---------+-----------+----------+-------------------+ PERO                                                  not clearly                                                               visualized          +---------+---------------+---------+-----------+----------+-------------------+   +---------+---------------+---------+-----------+----------+------------------+ LEFT     CompressibilityPhasicitySpontaneityPropertiesThrombus Aging     +---------+---------------+---------+-----------+----------+------------------+ CFV      Full           Yes      Yes                                     +---------+---------------+---------+-----------+----------+------------------+ SFJ      Full                                                            +---------+---------------+---------+-----------+----------+------------------+ FV Prox  Full                                                            +---------+---------------+---------+-----------+----------+------------------+ FV Mid   Full                                                            +---------+---------------+---------+-----------+----------+------------------+  FV Distal                        Yes                                     +---------+---------------+---------+-----------+----------+------------------+ PFV      Full                                                            +---------+---------------+---------+-----------+----------+------------------+ POP      Full           Yes      Yes                                     +---------+---------------+---------+-----------+----------+------------------+ PTV                                                   not clearly                                                              visualized          +---------+---------------+---------+-----------+----------+------------------+ PERO                                                  not clearly                                                              visualized         +---------+---------------+---------+-----------+----------+------------------+    Summary: RIGHT: - There is no evidence of deep vein thrombosis in the lower extremity. However, portions of this examination were limited- see technologist comments above.  LEFT: - There is no evidence of deep vein thrombosis in the lower extremity. However, portions of this examination were limited- see technologist comments above.  - Prominent lymph nodes noted bilateral groin areas.  *See table(s) above for measurements and observations. Electronically signed by Deitra Mayo MD on 01/13/2020 at 6:50:27 PM.    Final    ECHOCARDIOGRAM LIMITED  Result Date: 01/15/2020    ECHOCARDIOGRAM LIMITED REPORT   Patient Name:   CLIO FALANA Date of Exam: 01/15/2020 Medical Rec #:  [id number]     Height:       61.0 in Accession #:    [id number]    Weight:       249.3 lb Date of Birth:  [birth year]      BSA:  2.074 m Patient Age:    61 years      BP:           131/70 mmHg Patient Gender: F             HR:           53 bpm. Exam Location:  Inpatient Procedure: Limited Echo, Limited Color Doppler and Cardiac Doppler Indications:    Acute Respiratory Insufficiency R06.89  History:        Patient has prior history of Echocardiogram examinations, most                 recent 12/02/2016. Risk Factors:Hypertension and Dyslipidemia.  Sonographer:    Mikki Santee RDCS (AE) Referring Phys: [place]  1. Left ventricular diastolic parameters are consistent with Grade II diastolic dysfunction (pseudonormalization).  2. Right ventricular systolic function is moderately reduced. There is severely elevated pulmonary artery systolic pressure.  3. The inferior vena cava is normal in  size with greater than 50% respiratory variability, suggesting right atrial pressure of 3 mmHg. FINDINGS  Left Ventricle: Indeterminate filling pressures. Right Ventricle: Right ventricular systolic function is moderately reduced. There is severely elevated pulmonary artery systolic pressure. The tricuspid regurgitant velocity is 3.86 m/s, and with an assumed right atrial pressure of 3 mmHg, the estimated right ventricular systolic pressure is [id number] mmHg. Tricuspid Valve: Tricuspid valve regurgitation is mild. Venous: The inferior vena cava is normal in size with greater than 50% respiratory variability, suggesting right atrial pressure of 3 mmHg.  LEFT VENTRICLE PLAX 2D LVOT diam:     2.10 cm  Diastology LV SV:         81       LV e' lateral:   5.61 cm/s LV SV Index:   39       LV E/e' lateral: 12.1 LVOT Area:     3.46 cm LV e' medial:    6.40 cm/s                         LV E/e' medial:  10.6  RIGHT VENTRICLE TAPSE (M-mode): 1.5 cm LEFT ATRIUM           Index       RIGHT ATRIUM           Index LA Vol (A4C): 37.4 ml 18.03 ml/m RA Area:     16.80 cm                                   RA Volume:   44.50 ml  21.45 ml/m  AORTIC VALVE LVOT Vmax:   98.60 cm/s LVOT Vmean:  69.800 cm/s LVOT VTI:    0.235 m MITRAL VALVE               TRICUSPID VALVE MV Area (PHT): 3.53 cm    TR Peak grad:   59.6 mmHg MV Decel Time: 215 msec    TR Vmax:        386.00 cm/s MV E velocity: 67.70 cm/s MV A velocity: 65.10 cm/s  SHUNTS MV E/A ratio:  1.04        Systemic VTI:  0.24 m                            Systemic Diam: 2.10 cm Skeet Latch MD Electronically signed  by Skeet Latch MD Signature Date/Time: 01/15/2020/3:50:03 PM    Final

## 2020-01-20 NOTE — Progress Notes (Signed)
Patient ID: [name], female   DOB: [birth year], 64 y.o.   MRN: [id number] S: Did well overnight and responded to po lasix O:BP (!) 116/98 (BP Location: Left Arm)   Pulse 63   Temp 97.7 F (36.5 C) (Axillary)   Resp (!) 24   Ht 5\' 1"  (1.549 m)   Wt 107.2 kg   LMP 02/16/2017 (Within Weeks) Comment: tubal ligation  SpO2 94%   BMI 44.65 kg/m   Intake/Output Summary (Last 24 hours) at 01/20/2020 1157 Last data filed at 01/20/2020 1124 Gross per 24 hour  Intake 600 ml  Output 800 ml  Net -200 ml   Intake/Output: I/O last 3 completed shifts: In: 600 [P.O.:600] Out: 1100 [Urine:1100]  Intake/Output this shift:  Total I/O In: 240 [P.O.:240] Out: -  Weight change:  Physical exam: unable to complete due to COVID + status.  In order to preserve PPE equipment and to minimize exposure to providers.  Notes from other caregivers reviewed   Recent Labs  Lab 01/14/20 0307 01/15/20 0528 01/16/20 [id number] 01/17/20 0831 01/18/20 0702 01/19/20 0534 01/20/20 0547  NA 140 140 140 140 138 139 141  K 4.5 4.7 4.7 4.5 4.8 5.3* 5.3*  CL 109 109 109 109 107 109 107  CO2 19* 19* 20* 19* 18* 18* 20*  GLUCOSE 196* 253* 217* 159* 178* 227* 155*  BUN 44* 63* 81* 94* 101* 100* 96*  CREATININE 2.08* 2.48* 2.96* 3.11* 2.98* 2.74* 2.34*  ALBUMIN 2.3* 2.2* 2.2* 2.5* 2.6* 2.7* 2.7*  CALCIUM 8.1* 8.2* 8.3* 8.6* 8.7* 8.6* 8.7*  PHOS 4.6 5.3*  --   --   --   --   --   AST 62* 57* 60* 69* 61* 63* 59*  ALT 34 31 32 43 38 41 36   Liver Function Tests: Recent Labs  Lab 01/18/20 0702 01/19/20 0534 01/20/20 0547  AST 61* 63* 59*  ALT 38 41 36  ALKPHOS 73 70 67  BILITOT 0.8 1.0 1.0  PROT 6.2* 6.1* 5.7*  ALBUMIN 2.6* 2.7* 2.7*   No results for input(s): LIPASE, AMYLASE in the last 168 hours. No results for input(s): AMMONIA in the last 168 hours. CBC: Recent Labs  Lab 01/16/20 0632 01/16/20 [id number] 01/17/20 0831 01/17/20 0831 01/18/20 0702 01/19/20 0534 01/20/20 0547  WBC 14.7*   < > 16.8*   < >  20.4* 19.9* 21.4*  NEUTROABS 12.4*  --  13.9*  --  17.0*  --   --   HGB 10.0*   < > 10.8*   < > 11.3* 11.6* 11.1*  HCT 30.9*   < > 32.7*   < > 34.2* 36.9 33.5*  MCV 92.2  --  90.1  --  91.2 95.3 91.5  PLT 273   < > 323   < > 336 361 355   < > = values in this interval not displayed.   Cardiac Enzymes: No results for input(s): CKTOTAL, CKMB, CKMBINDEX, TROPONINI in the last 168 hours. CBG: Recent Labs  Lab 01/19/20 1210 01/19/20 1744 01/19/20 2046 01/20/20 0830 01/20/20 1151  GLUCAP 155* 162* 166* 143* 185*    Iron Studies:  Recent Labs    01/18/20 0702  FERRITIN 1,823*   Studies/Results: No results found. . vitamin C  500 mg Oral Daily  . aspirin EC  81 mg Oral Q breakfast  . atorvastatin  40 mg Oral Daily  . brimonidine  1 drop Both Eyes BID  . clopidogrel  75 mg Oral Daily  .  dorzolamide-timolol  1 drop Both Eyes BID  . doxazosin  4 mg Oral QHS  . furosemide  80 mg Intravenous Once  . heparin injection (subcutaneous)  5,000 Units Subcutaneous Q8H  . insulin aspart  0-20 Units Subcutaneous TID WC  . insulin aspart  0-5 Units Subcutaneous QHS  . insulin glargine  26 Units Subcutaneous BID  . Ipratropium-Albuterol  1 puff Inhalation Q6H  . latanoprost  1 drop Both Eyes QHS  . linagliptin  5 mg Oral Daily  . methylPREDNISolone (SOLU-MEDROL) injection  40 mg Intravenous Q12H  . pantoprazole  40 mg Oral Q1200  . prednisoLONE acetate  1 drop Right Eye BID  . sodium bicarbonate  1,300 mg Oral BID  . sodium chloride flush  3 mL Intravenous Once  . zinc sulfate  220 mg Oral Daily    BMET    Component Value Date/Time   NA 141 01/20/2020 0547   NA 141 01/18/2017 1209   K 5.3 (H) 01/20/2020 0547   CL 107 01/20/2020 0547   CO2 20 (L) 01/20/2020 0547   GLUCOSE 155 (H) 01/20/2020 0547   BUN 96 (H) 01/20/2020 0547   BUN 32 (H) 01/18/2017 1209   CREATININE 2.34 (H) 01/20/2020 0547   CREATININE 1.19 (H) 01/22/2013 1949   CALCIUM 8.7 (L) 01/20/2020 0547   GFRNONAA 21  (L) 01/20/2020 0547   GFRAA 25 (L) 01/20/2020 0547   CBC    Component Value Date/Time   WBC 21.4 (H) 01/20/2020 0547   RBC 3.66 (L) 01/20/2020 0547   HGB 11.1 (L) 01/20/2020 0547   HCT 33.5 (L) 01/20/2020 0547   PLT 355 01/20/2020 0547   MCV 91.5 01/20/2020 0547   MCH 30.3 01/20/2020 0547   MCHC 33.1 01/20/2020 0547   RDW 16.7 (H) 01/20/2020 0547   LYMPHSABS 1.5 01/18/2020 0702   MONOABS 1.3 (H) 01/18/2020 0702   EOSABS 0.0 01/18/2020 0702   BASOSABS 0.0 01/18/2020 0702    Assessment/Plan:  1. AKI/CKD stage IIIa (baseline Scr 1.5)- multi-factorial in setting of covid pneumonia, IV contrast, acute on chronic diastolic disfunction.  Scr has improved but BUN rising, likely due to IV steroids.  BUN/Cr improved over last 24 hours. 2. Acute on chronic diastolic CHF- ok to dose with lasix 80 mg again today given rising K, edema, and SOB.  Discussed with Dr. Sloan Leiter. ECHO with pulmonary HTN and RV dysfunction.  Likely OSA.  Should be ok to keep daily lasix. 3. Covid PNA- s/p actemra and on remdesivir and steroids. 4. Hyperkalemia- due to #1.  Ok to give lasix and will also add lokelma.  Continue to follow 5. Metabolic acidosis- due to #1.  improved after IV lasix. 6. PAD s/p stent to left SFA on 01/08/20 with bump in Scr related to CIN.  7. HTN- BP stable. Lisinopril on hold.  Lasix today. [name], MD [employer] [phone]

## 2020-01-20 NOTE — TOC Progression Note (Addendum)
Transition of Care [facility]) - Progression Note    Patient Details  Name: [name] MRN: [id number] Date of Birth: March 21, 1956  Transition of Care [facility]) CM/SW [place], LCSW Phone Number: 01/20/2020, 10:25 AM  Clinical Narrative:    10:25am-CSW requesting Miquel Dunn review patient. [facility] reviewing again.   11:30am-[facility] able to accept patient and will being insurance authorization process. CSW left voicemail for patient's daughter per MD request.   12:19pm-Daughter updated on [place] and insurance process.   Expected Discharge Plan: Skilled Nursing Facility Barriers to Discharge: SNF Pending bed offer, Insurance Authorization  Expected Discharge Plan and Services Expected Discharge Plan: [place] Choice: [place] arrangements for the past 2 months: Single Family Home                                       Social Determinants of Health (SDOH) Interventions    Readmission Risk Interventions Readmission Risk Prevention Plan 01/09/2020  Transportation Screening Complete  PCP or Specialist Appt within 3-5 Days Complete  HRI or [place] Complete  Social Work Consult for [place] Planning/Counseling Complete  Palliative Care Screening Not Applicable  Medication Review [employer] photographer) Complete  Some recent data might be hidden

## 2020-01-21 LAB — COMPREHENSIVE METABOLIC PANEL
ALT: 57 U/L — ABNORMAL HIGH (ref 0–44)
AST: 105 U/L — ABNORMAL HIGH (ref 15–41)
Albumin: 2.6 g/dL — ABNORMAL LOW (ref 3.5–5.0)
Alkaline Phosphatase: 64 U/L (ref 38–126)
Anion gap: 12 (ref 5–15)
BUN: 27 mg/dL — ABNORMAL HIGH (ref 8–23)
CO2: 24 mmol/L (ref 22–32)
Calcium: 8 mg/dL — ABNORMAL LOW (ref 8.9–10.3)
Chloride: 103 mmol/L (ref 98–111)
Creatinine, Ser: 0.95 mg/dL (ref 0.44–1.00)
GFR calc Af Amer: >60 mL/min (ref >60)
GFR calc non Af Amer: >60 mL/min (ref >60)
Glucose, Bld: 132 mg/dL — ABNORMAL HIGH (ref 70–99)
Potassium: 4.1 mmol/L (ref 3.5–5.1)
Sodium: 139 mmol/L (ref 135–145)
Total Bilirubin: 0.8 mg/dL (ref 0.3–1.2)
Total Protein: 5.2 g/dL — ABNORMAL LOW (ref 6.5–8.1)

## 2020-01-21 LAB — CBC
HCT: 37.3 % (ref 36.0–46.0)
Hemoglobin: 11.6 g/dL — ABNORMAL LOW (ref 12.0–15.0)
MCH: 30.3 pg (ref 26.0–34.0)
MCHC: 31.1 g/dL (ref 30.0–36.0)
MCV: 97.4 fL (ref 80.0–100.0)
Platelets: 334 10*3/uL (ref 150–400)
RBC: 3.83 MIL/uL — ABNORMAL LOW (ref 3.87–5.11)
RDW: 17.8 % — ABNORMAL HIGH (ref 11.5–15.5)
WBC: 25.9 10*3/uL — ABNORMAL HIGH (ref 4.0–10.5)
nRBC: 1.3 % — ABNORMAL HIGH (ref 0.0–0.2)

## 2020-01-21 LAB — GLUCOSE, CAPILLARY
Glucose-Capillary: 148 mg/dL — ABNORMAL HIGH (ref 70–99)
Glucose-Capillary: 174 mg/dL — ABNORMAL HIGH (ref 70–99)
Glucose-Capillary: 209 mg/dL — ABNORMAL HIGH (ref 70–99)
Glucose-Capillary: 240 mg/dL — ABNORMAL HIGH (ref 70–99)

## 2020-01-21 LAB — C-REACTIVE PROTEIN: CRP: 5.7 mg/dL — ABNORMAL HIGH (ref <1.0)

## 2020-01-21 MED ORDER — TORSEMIDE 20 MG PO TABS
40.0000 mg | ORAL_TABLET | Freq: Every day | ORAL | Status: DC
  Administered 2020-01-21 – 2020-01-23 (×2): 40 mg via ORAL
  Filled 2020-01-21 (×3): qty 2

## 2020-01-21 MED ORDER — PREDNISONE 20 MG PO TABS
40.0000 mg | ORAL_TABLET | Freq: Every day | ORAL | Status: DC
  Administered 2020-01-23 – 2020-01-24 (×2): 40 mg via ORAL
  Filled 2020-01-21 (×4): qty 2

## 2020-01-21 NOTE — Progress Notes (Signed)
PROGRESS NOTE                                                                                                                                                                                                             Patient Demographics:    [name], is a 64 y.o. female, DOB - Sep 25, 1955, [id number]  Outpatient Primary MD for the patient is Pavelock, Ralene Bathe, MD   Admit date - 01/05/2020   LOS - 8  Chief Complaint  Patient presents with  . Shortness of Breath  . Chest Pain       Brief Narrative: Patient is a 64 y.o. female  PMHx of hypertension, HLD, DM-2, CKD stage IV, PVD s/p stent to left distal SFA on [id number], chronic diastolic heart failure-presented with shortness of breath or cough-found to have acute hypoxic respiratory failure secondary to COVID-19 pneumonia and decompensated diastolic heart failure.  Significant Events: 4/18-4/23>> admit to [facility]-s/p stent to left distal SFA 4/27>> admit to [facility] for hypoxia from Covid/heart failure 4/27>> lower extremity Dopplers negative for DVT. [id number] grade 2 diastolic dysfunction, moderate RV systolic dysfunction.  RVSP 62.6 mmHg. 4/30>> VQ scan-low probability PE  COVID-19 medications: Steroids: 4/27>> Remdesivir: 4/27>> 5/1  Antibiotics: Vancomycin: 4/27 x 1 Cefepime: 4/27 x 1  Microbiology data: 4/27: Blood culture>> no growth 4/28: Blood culture>> no growth  DVT prophylaxis: SQ heparin  Procedures: None  Consults: None    Subjective:   On room air this morning-much more awake and alert.    Very hard stick-labs done this morning probably a lab error-repeat labs not possible as phlebotomy not able to obtain blood.   Assessment  & Plan :   Acute Hypoxic Resp Failure due to Covid 19 Viral pneumonia and decompensated diastolic heart failure: Significantly improved-now on room air.  Continue supportive care-VQ scan without PE, lower  extremity Dopplers negative.  Continued attempts to slowly titrate down FiO2.    COVID-19 pneumonia: Hypoxia has improved-has completed a course of remdesivir, s/p Actemra on 4/30-Solu-Medrol-switch to prednisone-with plans for a quick taper.  CRP downtrending.  Continue to follow-continue supportive care.   Fever: afebrile  O2 requirements:  SpO2: 99 % O2 Flow Rate (L/min): 3 L/min   COVID-19 Labs: Recent Labs    01/19/20 0534 01/20/20 0547 01/21/20 1115  DDIMER 4.50* 4.79*  --  CRP 3.6* 2.5* 5.7*       Component Value Date/Time   BNP 134.9 (H) 01/17/2020 0831    No results for input(s): PROCALCITON in the last 168 hours.  Lab Results  Component Value Date   [place] (A) 01/13/2020   [place] NEGATIVE 01/05/2020   [facility] Not Detected 11/24/2019     Prone/Incentive Spirometry: encouraged  incentive spirometry use 3-4/hour.  Decompensated diastolic heart failure: Volume status continues to improve-did require IV Lasix for the past few days-we will switch to Demadex 40 mg p.o. daily.  Follow renal function closely.  Pulmonary hypertension/RV dysfunction: Not seen in prior echo's-I suspect this is probably from OSA-she has been noncompliant to CPAP, but given her worsening hypoxemia-she underwent a VQ scan-which was low probability for PE.  Lower extremity Dopplers negative.  Briefly on IV heparin-but have switched back to prophylactic dosing.   AKI on CKD stage IV: Improving-AKI likely hemodynamically mediated.  Renal ultrasound without any hydronephrosis.  Repeat labs tomorrow-labs today likely a lab error (has not had a normal creatinine for years)  Acute metabolic encephalopathy: Resolved-back to baseline.  Likely secondary to hypoxemia and AKI.  Continue supportive care.   Elevated troponin: Trend is flat-not consistent with ACS-likely secondary to underlying CKD-with some component from demand ischemia due to COVID-19/heart failure.  PAD-s/p stent  to left SFA on 4/22: Both lower extremities appear warm-continue aspirin, Plavix and statin.  HTN: BP stable-continue Cardura-Lasix and lisinopril on hold.    DM-2 with hyperglycemia (A1c 12.2 on 4/19): CBGs relatively stable-continue Lantus 26 units twice daily-SSI.  Follow and optimize.  Recent Labs    01/20/20 2212 01/21/20 0810 01/21/20 1206  GLUCAP 270* 148* 174*    Thrombocytopenia: Resolved.  Leukocytosis: Secondary to steroids-no evidence of any superimposed bacterial infection.  Anemia of chronic disease: Close to baseline-likely related to CKD.  Gout: No flare-hold colchicine for now given AKI.  OSA: Noncompliant to CPAP-continue CPAP nightly if possible in the hospital.  Obesity: Estimated body mass index is 43.95 kg/m as calculated from the following:   Height as of this encounter: 5\' 1"  (1.549 m).   Weight as of this encounter: 105.5 kg.   ABG:    Component Value Date/Time   PHART 7.460 (H) 09/30/2008 1120   PCO2ART 41.0 09/30/2008 1120   PO2ART 83.0 09/30/2008 1120   HCO3 26.4 12/06/2016 0922   HCO3 26.1 12/06/2016 0922   TCO2 28 12/06/2016 0922   TCO2 28 12/06/2016 0922   O2SAT 69.0 12/06/2016 0922   O2SAT 67.0 12/06/2016 0922    Vent Settings: N/A  Condition - Guarded  Family Communication  : Spoke with daughter-5/5  Code Status :  Full Code  Diet :  Diet Order            Diet heart healthy/carb modified Room service appropriate? Yes; Fluid consistency: Thin; Fluid restriction: 2000 mL Fluid  Diet effective now               Disposition Plan  :   Status is: Inpatient  Remains inpatient appropriate because:Inpatient level of care appropriate due to severity of illness  Dispo: The patient is from: Home              Anticipated d/c is to: Home              Anticipated d/c date is: > 3 days              Patient currently is medically stable to  d/c.  Barriers to discharge: awaiting SNF bed  Antimicorbials  :    Anti-infectives  (From admission, onward)   Start     Dose/Rate Route Frequency Ordered Stop   01/15/20 1200  vancomycin (VANCOREADY) IVPB 1250 mg/250 mL  Status:  Discontinued     1,250 mg 166.7 mL/hr over 90 Minutes Intravenous Every 48 hours 01/13/20 1018 01/14/20 1038   01/14/20 1000  remdesivir 100 mg in sodium chloride 0.9 % 100 mL IVPB     100 mg 200 mL/hr over 30 Minutes Intravenous Daily 01/13/20 1129 01/17/20 1113   01/13/20 1630  valACYclovir (VALTREX) tablet 1,000 mg  Status:  Discontinued     1,000 mg Oral 3 times daily 01/13/20 1557 01/17/20 1147   01/13/20 1230  remdesivir 200 mg in sodium chloride 0.9% 250 mL IVPB     200 mg 580 mL/hr over 30 Minutes Intravenous Once 01/13/20 1129 01/13/20 1800   01/13/20 1015  ceFEPIme (MAXIPIME) 2 g in sodium chloride 0.9 % 100 mL IVPB     2 g 200 mL/hr over 30 Minutes Intravenous  Once 01/13/20 1003 01/13/20 1434   01/13/20 1015  vancomycin (VANCOREADY) IVPB 2000 mg/400 mL     2,000 mg 200 mL/hr over 120 Minutes Intravenous  Once 01/13/20 1012 01/13/20 2258      Inpatient Medications  Scheduled Meds: . vitamin C  500 mg Oral Daily  . aspirin EC  81 mg Oral Q breakfast  . atorvastatin  40 mg Oral Daily  . brimonidine  1 drop Both Eyes BID  . clopidogrel  75 mg Oral Daily  . dorzolamide-timolol  1 drop Both Eyes BID  . doxazosin  4 mg Oral QHS  . heparin injection (subcutaneous)  5,000 Units Subcutaneous Q8H  . insulin aspart  0-20 Units Subcutaneous TID WC  . insulin aspart  0-5 Units Subcutaneous QHS  . insulin glargine  26 Units Subcutaneous BID  . Ipratropium-Albuterol  1 puff Inhalation Q6H  . latanoprost  1 drop Both Eyes QHS  . linagliptin  5 mg Oral Daily  . pantoprazole  40 mg Oral Q1200  . prednisoLONE acetate  1 drop Right Eye BID  . [START ON 01/22/2020] predniSONE  40 mg Oral Q breakfast  . sodium bicarbonate  1,300 mg Oral BID  . sodium chloride flush  3 mL Intravenous Once  . sodium zirconium cyclosilicate  10 g Oral Daily  .  zinc sulfate  220 mg Oral Daily   Continuous Infusions:  PRN Meds:.acetaminophen, chlorpheniramine-HYDROcodone, guaiFENesin-dextromethorphan, ondansetron **OR** ondansetron (ZOFRAN) IV, oxyCODONE-acetaminophen   Time Spent in minutes  25  See all Orders from today for further details   Oren Binet M.D on 01/21/2020 at 4:54 PM  To page go to [email or web address] - use universal password  [facility] -  Office  [phone]    Objective:   Vitals:   01/20/20 2358 01/21/20 0613 01/21/20 1100 01/21/20 1424  BP: (!) 145/62  132/62 (!) 133/55  Pulse:   67 72  Resp:    (!) 22  Temp: 97.9 F (36.6 C)  97.8 F (36.6 C) 97.8 F (36.6 C)  TempSrc: Oral  Oral Oral  SpO2:   100% 99%  Weight:  105.5 kg    Height:        Wt Readings from Last 3 Encounters:  01/21/20 105.5 kg  01/08/20 115.6 kg  07/15/19 102.9 kg     Intake/Output Summary (Last 24 hours) at 01/21/2020 1654 Last data filed at  01/21/2020 0600 Gross per 24 hour  Intake --  Output 500 ml  Net -500 ml    Physical Exam Gen Exam:Alert awake-not in any distress HEENT:atraumatic, normocephalic Chest: B/L clear to auscultation anteriorly CVS:S1S2 regular Abdomen:soft non tender, non distended Extremities:+ edema Neurology: Non focal Skin: no rash   Data Review:    CBC Recent Labs  Lab 01/15/20 0528 01/15/20 0528 01/16/20 [id number] 01/16/20 [id number] 01/17/20 0831 01/18/20 0702 01/19/20 0534 01/20/20 0547 01/21/20 1115  WBC 9.1   < > 14.7*   < > 16.8* 20.4* 19.9* 21.4* 25.9*  HGB 10.1*   < > 10.0*   < > 10.8* 11.3* 11.6* 11.1* 11.6*  HCT 31.2*   < > 30.9*   < > 32.7* 34.2* 36.9 33.5* 37.3  PLT 208   < > 273   < > 323 336 361 355 334  MCV 92.0   < > 92.2   < > 90.1 91.2 95.3 91.5 97.4  MCH 29.8   < > 29.9   < > 29.8 30.1 30.0 30.3 30.3  MCHC 32.4   < > 32.4   < > 33.0 33.0 31.4 33.1 31.1  RDW 16.9*   < > 16.9*   < > 16.7* 16.9* 17.2* 16.7* 17.8*  LYMPHSABS 1.0  --  1.3  --  1.4 1.5  --   --   --   MONOABS  0.4  --  0.8  --  1.1* 1.3*  --   --   --   EOSABS 0.0  --  0.0  --  0.0 0.0  --   --   --   BASOSABS 0.0  --  0.0  --  0.0 0.0  --   --   --    < > = values in this interval not displayed.    Chemistries  Recent Labs  Lab 01/15/20 0528 01/15/20 0528 01/16/20 [id number] 01/16/20 [id number] 01/17/20 0831 01/18/20 0702 01/19/20 0534 01/20/20 0547 01/21/20 1115  NA 140   < > 140   < > 140 138 139 141 139  K 4.7   < > 4.7   < > 4.5 4.8 5.3* 5.3* 4.1  CL 109   < > 109   < > 109 107 109 107 103  CO2 19*   < > 20*   < > 19* 18* 18* 20* 24  GLUCOSE 253*   < > 217*   < > 159* 178* 227* 155* 132*  BUN 63*   < > 81*   < > 94* 101* 100* 96* 27*  CREATININE 2.48*   < > 2.96*   < > 3.11* 2.98* 2.74* 2.34* 0.95  CALCIUM 8.2*   < > 8.3*   < > 8.6* 8.7* 8.6* 8.7* 8.0*  MG 2.0  --  2.1  --  2.4 2.4  --   --   --   AST 57*   < > 60*   < > 69* 61* 63* 59* 105*  ALT 31   < > 32   < > 43 38 41 36 57*  ALKPHOS 47   < > 59   < > 71 73 70 67 64  BILITOT 0.6   < > 0.9   < > 0.9 0.8 1.0 1.0 0.8   < > = values in this interval not displayed.   ------------------------------------------------------------------------------------------------------------------ No results for input(s): CHOL, HDL, LDLCALC, TRIG, CHOLHDL, LDLDIRECT in the last 72 hours.  Lab Results  Component Value  Date   HGBA1C 12.2 (H) 01/05/2020   ------------------------------------------------------------------------------------------------------------------ No results for input(s): TSH, T4TOTAL, T3FREE, THYROIDAB in the last 72 hours.  Invalid input(s): FREET3 ------------------------------------------------------------------------------------------------------------------ No results for input(s): VITAMINB12, FOLATE, FERRITIN, TIBC, IRON, RETICCTPCT in the last 72 hours.  Coagulation profile No results for input(s): INR, PROTIME in the last 168 hours.  Recent Labs    01/19/20 0534 01/20/20 0547  DDIMER 4.50* 4.79*    Cardiac  Enzymes No results for input(s): CKMB, TROPONINI, MYOGLOBIN in the last 168 hours.  Invalid input(s): CK ------------------------------------------------------------------------------------------------------------------    Component Value Date/Time   BNP 134.9 (H) 01/17/2020 0831    Micro Results Recent Results (from the past 240 hour(s))  Respiratory Panel by RT PCR (Flu A&B, Covid) - Nasopharyngeal Swab     Status: Abnormal   Collection Time: 01/13/20 10:25 AM   Specimen: Nasopharyngeal Swab  Result Value Ref Range Status   SARS Coronavirus 2 by RT PCR POSITIVE (A) NEGATIVE Final    Comment: RESULT CALLED TO, READ BACK BY AND VERIFIED WITH: Sheran Lawless RN 12:25 01/13/20 ([email or web address]) (NOTE) SARS-CoV-2 target nucleic acids are DETECTED. SARS-CoV-2 RNA is generally detectable in upper respiratory specimens  during the acute phase of infection. Positive results are indicative of the presence of the identified virus, but do not rule out bacterial infection or co-infection with other pathogens not detected by the test. Clinical correlation with patient history and other diagnostic information is necessary to determine patient infection status. The expected result is Negative. Fact Sheet for Patients:  PinkCheek.be Fact Sheet for Healthcare Providers: GravelBags.it This test is not yet approved or cleared by the [place] FDA and  has been authorized for detection and/or diagnosis of SARS-CoV-2 by FDA under an Emergency Use Authorization (EUA).  This EUA will remain in effect (meaning this test can be used)  for the duration of  the COVID-19 declaration under Section 564(b)(1) of the Act, 21 U.S.C. section 360bbb-3(b)(1), unless the authorization is terminated or revoked sooner.    Influenza A by PCR NEGATIVE NEGATIVE Final   Influenza B by PCR NEGATIVE NEGATIVE Final    Comment: (NOTE) The Xpert Xpress SARS-CoV-2/FLU/RSV  assay is intended as an aid in  the diagnosis of influenza from Nasopharyngeal swab specimens and  should not be used as a sole basis for treatment. Nasal washings and  aspirates are unacceptable for Xpert Xpress SARS-CoV-2/FLU/RSV  testing. Fact Sheet for Patients: PinkCheek.be Fact Sheet for Healthcare Providers: GravelBags.it This test is not yet approved or cleared by the [place] FDA and  has been authorized for detection and/or diagnosis of SARS-CoV-2 by  FDA under an Emergency Use Authorization (EUA). This EUA will remain  in effect (meaning this test can be used) for the duration of the  Covid-19 declaration under Section 564(b)(1) of the Act, 21  U.S.C. section 360bbb-3(b)(1), unless the authorization is  terminated or revoked. Performed at [facility], [place] [address] [place], [place] [address]   Blood culture (routine x 2)     Status: None   Collection Time: 01/13/20 11:10 AM   Specimen: BLOOD  Result Value Ref Range Status   Specimen Description BLOOD LEFT ANTECUBITAL  Final   Special Requests   Final    BOTTLES DRAWN AEROBIC AND ANAEROBIC Blood Culture results may not be optimal due to an inadequate volume of blood received in culture bottles   Culture   Final    NO GROWTH 5 DAYS Performed at [facility],  [address], [place], [place] [address]    Report Status 01/18/2020 FINAL  Final  Blood culture (routine x 2)     Status: None   Collection Time: 01/14/20  8:51 AM   Specimen: BLOOD LEFT HAND  Result Value Ref Range Status   Specimen Description BLOOD LEFT HAND  Final   Special Requests   Final    BOTTLES DRAWN AEROBIC AND ANAEROBIC Blood Culture results may not be optimal due to an inadequate volume of blood received in culture bottles   Culture   Final    NO GROWTH 5 DAYS Performed at [facility], [place] [address] [place], [place] [address]    Report Status 01/19/2020 FINAL   Final    Radiology Reports DG Chest 2 View  Result Date: 01/13/2020 CLINICAL DATA:  Shortness of breath and bilateral leg swelling. EXAM: CHEST - 2 VIEW COMPARISON:  November 30, 2016 FINDINGS: Moderate severity diffuse multifocal infiltrates are seen, bilaterally. There is no evidence of a pleural effusion or pneumothorax. The cardiac silhouette is mildly enlarged. A radiopaque fusion plate and screws are seen overlying the lower cervical spine. The visualized skeletal structures are otherwise unremarkable. Radiopaque surgical clips are seen within the right upper quadrant. IMPRESSION: Moderate severity bilateral multifocal infiltrates. Electronically Signed   By: Virgina Norfolk M.D.   On: 01/13/2020 00:27   NM Pulmonary Perfusion  Result Date: 01/16/2020 CLINICAL DATA:  Shortness of breath, history of COVID-19 positivity EXAM: NUCLEAR MEDICINE PERFUSION LUNG SCAN TECHNIQUE: Perfusion images were obtained in multiple projections after intravenous injection of radiopharmaceutical. Ventilation scans intentionally deferred if perfusion scan and chest x-ray adequate for interpretation during COVID 19 epidemic. RADIOPHARMACEUTICALS:  1.6 mCi Tc-6m MAA IV COMPARISON:  Chest x-ray from earlier in the same day. FINDINGS: Perfusion images demonstrate adequate uptake throughout both lungs despite the changes seen on recent chest x-ray. No focal defect to suggest pulmonary embolism is noted. IMPRESSION: No evidence of pulmonary embolism. Electronically Signed   By: Inez Catalina M.D.   On: 01/16/2020 16:23   US RENAL  Result Date: 01/16/2020 CLINICAL DATA:  Acute renal insufficiency. EXAM: RENAL / URINARY TRACT ULTRASOUND COMPLETE COMPARISON:  None. FINDINGS: Exam difficult due to patient body habitus. Right Kidney: Renal measurements: 9.4 x 4.2 x 4.5 cm = volume: 94 mL . Echogenicity within normal limits. Mild cortical thinning. No mass or hydronephrosis visualized. Left Kidney: Renal measurements: 10.1 x 5.2 x  5.5 cm = volume: 150 mL. Echogenicity within normal limits. Mild cortical thinning. No mass or hydronephrosis visualized. Bladder: Not visualized. Other: None. IMPRESSION: Normal size kidneys without hydronephrosis. Electronically Signed   By: Marin Olp M.D.   On: 01/16/2020 16:38   US RENAL  Result Date: 01/06/2020 CLINICAL DATA:  Chronic kidney disease EXAM: RENAL / URINARY TRACT ULTRASOUND COMPLETE COMPARISON:  None. FINDINGS: Right Kidney: Renal measurements: 9.3 x 4.3 x 4.6 cm = volume: 96 mL. Lobulated contour. No hydronephrosis. Normal echogenicity. Left Kidney: Renal measurements: 10.7 x 5.0 x 5.2 cm = volume: 145 mL. Lobulated contour without hydronephrosis. Normal echogenicity. Bladder: Decompressed. Other: Visualization is generally limited by body habitus. IMPRESSION: No hydronephrosis. Lobulated renal contours without focal parenchymal abnormality. Electronically Signed   By: Ulyses Jarred M.D.   On: 01/06/2020 16:16   PERIPHERAL VASCULAR CATHETERIZATION  Result Date: 01/08/2020 Patient name: Shavona Mazeika   MRN: [id number]        DOB: Nov 08, 1955            Sex:  female  01/08/2020 Pre-operative Diagnosis: Critical limb ischemia of the left lower extremity with tissue loss Post-operative diagnosis:  Same Surgeon:  Marty Heck, MD Procedure Performed: 1.  Ultrasound guided access of the right common femoral artery 2.  Aortogram with CO2 including catheter selection of the aorta 3.  Left lower extremity arteriogram with selection of third order branches 4.  Left distal SFA above-knee popliteal artery angioplasty with stent placement (primarily stented with a 6 mm x 60 mm drug-coated Eluvia postdilated with a 5 mm Mustang) 5.  Left anterior tibial angioplasty for focal perforation with resolution of the perforation (3 mm Sterling) 6.  Mynx closure of the right common femoral artery 7.  55 minutes of monitored moderate conscious sedation time   Indications: Patient is a 64 year old female  who was seen in consultation by Dr. Trula Slade for left lower extremity rest pain with severely depressed ABIs and duplex showing likely popliteal disease.  She presents today for left lower extremity arteriogram after risk benefits discussed.  Findings:  Aortogram was performed with CO2 to limit contrast and there was no flow-limiting aortoiliac stenosis.  Left lower extremity arteriogram showed a patent common femoral profunda and widely patent SFA in the proximal to mid segment.  At [facility] canal in the distal SFA and above-knee popliteal artery showed a very short focal occlusion with large collateral around this.  She had a patent below-knee popliteal artery with three-vessel runoff.  Anterior tibial appeared to be dominant runoff.   Ultimately this lesion was crossed in the distal SFA/above knee popliteal artery and primarily stented with a 6 mm x 60 mm Eluvia.  She now has excellent inline flow down the left lower extremity with no residual stenosis and three-vessel runoff.  There was a focal perforation in the anterior tibial where our wire likely made a perforation during exchange of balloons and stents.  Ultimately this was treated with a very low inflation of a 3 mm Sterling for 3 minutes with resolution.  Patient has preserved three vessel runoff.              Procedure:  The patient was identified in the holding area and taken to room 8.  The patient was then placed supine on the table and prepped and draped in the usual sterile fashion.  A time out was called.  Ultrasound was used to evaluate the right common femoral artery.  It was patent .  A digital ultrasound image was acquired.  A micropuncture needle was used to access the right common femoral artery under ultrasound guidance.  An 018 wire was advanced without resistance and a micropuncture sheath was placed.  The 018 wire was removed and a benson wire was placed.  The micropuncture sheath was exchanged for a 5 french sheath.  An omniflush  catheter was advanced over the wire to the level of L-1.  An abdominal angiogram was obtained.  Next, using glidewire and straight 4 French flush catheter the aortic bifurcation was cross because the Omni would not track.  Placed the catheter into theleft external iliac artery and left runoff was obtained.  Ultimately after evaluating the pictures elected to intervene on the distal left SFA above-knee popliteal occlusion.  Used a long Glidewire advantage down the SFA and exchanged for a 6 [place] Ansell sheath in the right groin over the aortic bifurcation.  Patient was given 100 units per kilogram heparin.  I then primarily stented this lesion with a 6 mm x 60 mm [place]  that was postdilated with a 5 mm x 60 mm Mustang.  Final pictures showed no residual stenosis with excellent inline flow down the left lower extremity and three-vessel runoff.  There was a focal perforation in the proximal anterior tibial that I think was from our wire.  I then exchanged for a V 18 wire down the anterior tibial and then did a very slow inflation with a 3 mm Sterling in the left anterior tibial artery for 3 minutes.  There appeared to be resolution of the perforation after taking multiple additional pictures.  Wires and catheters removed and a short 6 French sheath was placed in the right groin.  We placed a mynx closure device.   Plan: Patient will be loaded on Plavix and will need to continue aspirin and Plavix.  Should have inline flow down left leg now.   Marty Heck, MD [facility] Office: [phone]   DG Chest Port 1V same Day  Result Date: 01/16/2020 CLINICAL DATA:  Shortness of breath.  COVID. EXAM: PORTABLE CHEST 1 VIEW COMPARISON:  Three days ago FINDINGS: More confluent bilateral interstitial and airspace opacity. Cardiomegaly. No visible effusion or pneumothorax. IMPRESSION: Worsening bilateral pneumonia. Electronically Signed   By: Monte Fantasia M.D.   On: 01/16/2020  08:34   DG Foot Complete Left  Result Date: 01/05/2020 CLINICAL DATA:  Swelling EXAM: LEFT FOOT - COMPLETE 3+ VIEW COMPARISON:  None. FINDINGS: Degenerative changes in the 1st MTP joint. No acute bony abnormality. Specifically, no fracture, subluxation, or dislocation. Vascular calcifications noted. No bone destruction seen to suggest osteomyelitis. IMPRESSION: No acute bony abnormality. Electronically Signed   By: Rolm Baptise M.D.   On: 01/05/2020 01:25   VAS [place] ABI WITH/WO TBI  Result Date: 01/05/2020 LOWER EXTREMITY DOPPLER STUDY Indications: Blue, cold toes. High Risk Factors: Hypertension, Diabetes.  Limitations: Today's exam was limited due to Patient body habitus. Comparison Study: No prior studies. Performing Technologist: Carlos Levering Rvt  Examination Guidelines: A complete evaluation includes at minimum, Doppler waveform signals and systolic blood pressure reading at the level of bilateral brachial, anterior tibial, and posterior tibial arteries, when vessel segments are accessible. Bilateral testing is considered an integral part of a complete examination. Photoelectric Plethysmograph (PPG) waveforms and toe systolic pressure readings are included as required and additional duplex testing as needed. Limited examinations for reoccurring indications may be performed as noted.  ABI Findings: +---------+------------------+-----+---------+--------+ Right    Rt Pressure (mmHg)IndexWaveform Comment  +---------+------------------+-----+---------+--------+ Brachial 150                    triphasic         +---------+------------------+-----+---------+--------+ PTA      108               0.72 biphasic          +---------+------------------+-----+---------+--------+ DP       137               0.91 triphasic         +---------+------------------+-----+---------+--------+ Great Toe76                0.51                   +---------+------------------+-----+---------+--------+  +---------+------------------+-----+----------+-------+ Left     Lt Pressure (mmHg)IndexWaveform  Comment +---------+------------------+-----+----------+-------+ Brachial 136                    triphasic         +---------+------------------+-----+----------+-------+  PTA      66                0.44 monophasic        +---------+------------------+-----+----------+-------+ DP       53                0.35 monophasic        +---------+------------------+-----+----------+-------+ Great Toe0                 0.00                   +---------+------------------+-----+----------+-------+ +-------+-----------+-----------+------------+------------+ ABI/TBIToday's ABIToday's TBIPrevious ABIPrevious TBI +-------+-----------+-----------+------------+------------+ Right  0.91       0.51                                +-------+-----------+-----------+------------+------------+ Left   0.44       0                                   +-------+-----------+-----------+------------+------------+  Summary: Right: Resting right ankle-brachial index indicates mild right lower extremity arterial disease. The right toe-brachial index is abnormal. Left: Resting left ankle-brachial index indicates severe left lower extremity arterial disease. The left toe-brachial index is abnormal.  *See table(s) above for measurements and observations.  Electronically signed by [name] MD on 01/05/2020 at 7:35:40 PM.    Final    VAS [place] LOWER EXTREMITY ARTERIAL DUPLEX  Result Date: 01/05/2020 LOWER EXTREMITY ARTERIAL DUPLEX STUDY Indications: Blue, cold toes. High Risk Factors: Hypertension, Diabetes.  Current ABI: R 0.91 L 0.44 Limitations: Patient body habitus, poor ultrasound/tissue interface. Comparison Study: No prior studies. Performing Technologist: Oliver Hum RVT  Examination Guidelines: A complete evaluation includes B-mode imaging, spectral Doppler, color Doppler, and power Doppler as  needed of all accessible portions of each vessel. Bilateral testing is considered an integral part of a complete examination. Limited examinations for reoccurring indications may be performed as noted.  +-----------+--------+-----+--------+----------+------------------+ LEFT       PSV cm/sRatioStenosisWaveform  Comments           +-----------+--------+-----+--------+----------+------------------+ EIA Distal 116                  biphasic                     +-----------+--------+-----+--------+----------+------------------+ CFA Mid    132                  biphasic                     +-----------+--------+-----+--------+----------+------------------+ DFA        111                  biphasic                     +-----------+--------+-----+--------+----------+------------------+ SFA Prox   104                  biphasic                     +-----------+--------+-----+--------+----------+------------------+ SFA Mid    85                   biphasic                     +-----------+--------+-----+--------+----------+------------------+  SFA Distal 91                   biphasic                     +-----------+--------+-----+--------+----------+------------------+ POP Prox   26                   monophasic                   +-----------+--------+-----+--------+----------+------------------+ POP Distal 0                              Unable to insonate +-----------+--------+-----+--------+----------+------------------+ ATA Prox   73                   biphasic                     +-----------+--------+-----+--------+----------+------------------+ ATA Mid    33                   monophasic                   +-----------+--------+-----+--------+----------+------------------+ ATA Distal 32                   monophasic                   +-----------+--------+-----+--------+----------+------------------+ PTA Prox                                   Unable to insonate +-----------+--------+-----+--------+----------+------------------+ PTA Mid    27                   monophasic                   +-----------+--------+-----+--------+----------+------------------+ PTA Distal 9                    monophasic                   +-----------+--------+-----+--------+----------+------------------+ PERO Prox                                 Unable to insonate +-----------+--------+-----+--------+----------+------------------+ PERO Mid                                  Unable to insonate +-----------+--------+-----+--------+----------+------------------+ PERO Distal15                   monophasic                   +-----------+--------+-----+--------+----------+------------------+ DP         29                   monophasic                   +-----------+--------+-----+--------+----------+------------------+  Summary: Left: The external iliac, common femoral, and superficial femoral arteries appear patent with no significant stenosis. Monophasic waveforms are noted in the proximal popliteal artery with poor visualization of the distal segment. Which suggests a possible occlusion. Anterior tibial artery appears patent throughout with monophasic flow. The posterior tibial, and peroneal arteries demonstrated monophasic flow  with likely areas of occlusive disease. The proximal calf arteries were difficult to visualize.  See table(s) above for measurements and observations. Electronically signed by [name] MD on 01/05/2020 at 104:34:41 PM.    Final    VAS [place] LOWER EXTREMITY VENOUS (DVT)  Result Date: 01/13/2020  Lower Venous DVTStudy Indications: Swelling.  Limitations: Body habitus and poor ultrasound/tissue interface. Performing Technologist: Antonieta Pert RDMS, RVT  Examination Guidelines: A complete evaluation includes B-mode imaging, spectral Doppler, color Doppler, and power Doppler as needed of all accessible portions of  each vessel. Bilateral testing is considered an integral part of a complete examination. Limited examinations for reoccurring indications may be performed as noted. The reflux portion of the exam is performed with the patient in reverse Trendelenburg.  +---------+---------------+---------+-----------+----------+-------------------+ RIGHT    CompressibilityPhasicitySpontaneityPropertiesThrombus Aging      +---------+---------------+---------+-----------+----------+-------------------+ CFV      Partial        Yes      Yes                  patient body                                                              habitus and                                                               position limiting                                                         evaluation          +---------+---------------+---------+-----------+----------+-------------------+ SFJ      Full                                                             +---------+---------------+---------+-----------+----------+-------------------+ FV Prox                          Yes                                      +---------+---------------+---------+-----------+----------+-------------------+ FV Mid                           Yes                                      +---------+---------------+---------+-----------+----------+-------------------+ FV Distal  Yes                                      +---------+---------------+---------+-----------+----------+-------------------+ PFV      Full                                                             +---------+---------------+---------+-----------+----------+-------------------+ POP      Full                                                             +---------+---------------+---------+-----------+----------+-------------------+ PTV                                                   not clearly                                                                visualized          +---------+---------------+---------+-----------+----------+-------------------+ PERO                                                  not clearly                                                               visualized          +---------+---------------+---------+-----------+----------+-------------------+   +---------+---------------+---------+-----------+----------+------------------+ LEFT     CompressibilityPhasicitySpontaneityPropertiesThrombus Aging     +---------+---------------+---------+-----------+----------+------------------+ CFV      Full           Yes      Yes                                     +---------+---------------+---------+-----------+----------+------------------+ SFJ      Full                                                            +---------+---------------+---------+-----------+----------+------------------+ FV Prox  Full                                                            +---------+---------------+---------+-----------+----------+------------------+  FV Mid   Full                                                            +---------+---------------+---------+-----------+----------+------------------+ FV Distal                        Yes                                     +---------+---------------+---------+-----------+----------+------------------+ PFV      Full                                                            +---------+---------------+---------+-----------+----------+------------------+ POP      Full           Yes      Yes                                     +---------+---------------+---------+-----------+----------+------------------+ PTV                                                   not clearly                                                              visualized          +---------+---------------+---------+-----------+----------+------------------+ PERO                                                  not clearly                                                              visualized         +---------+---------------+---------+-----------+----------+------------------+    Summary: RIGHT: - There is no evidence of deep vein thrombosis in the lower extremity. However, portions of this examination were limited- see technologist comments above.  LEFT: - There is no evidence of deep vein thrombosis in the lower extremity. However, portions of this examination were limited- see technologist comments above.  - Prominent lymph nodes noted bilateral groin areas.  *See table(s) above for measurements and observations. Electronically signed by Deitra Mayo MD on 01/13/2020 at 6:50:27 PM.    Final    ECHOCARDIOGRAM LIMITED  Result Date: 01/15/2020  ECHOCARDIOGRAM LIMITED REPORT   Patient Name:   BERLA BOUEY Date of Exam: 01/15/2020 Medical Rec #:  [id number]     Height:       61.0 in Accession #:    [id number]    Weight:       249.3 lb Date of Birth:  [birth year]      BSA:          2.074 m Patient Age:    56 years      BP:           131/70 mmHg Patient Gender: F             HR:           53 bpm. Exam Location:  Inpatient Procedure: Limited Echo, Limited Color Doppler and Cardiac Doppler Indications:    Acute Respiratory Insufficiency R06.89  History:        Patient has prior history of Echocardiogram examinations, most                 recent 12/02/2016. Risk Factors:Hypertension and Dyslipidemia.  Sonographer:    Mikki Santee RDCS (AE) Referring Phys: [place]  1. Left ventricular diastolic parameters are consistent with Grade II diastolic dysfunction (pseudonormalization).  2. Right ventricular systolic function is moderately reduced. There is severely elevated pulmonary artery systolic pressure.  3. The inferior vena cava is normal in  size with greater than 50% respiratory variability, suggesting right atrial pressure of 3 mmHg. FINDINGS  Left Ventricle: Indeterminate filling pressures. Right Ventricle: Right ventricular systolic function is moderately reduced. There is severely elevated pulmonary artery systolic pressure. The tricuspid regurgitant velocity is 3.86 m/s, and with an assumed right atrial pressure of 3 mmHg, the estimated right ventricular systolic pressure is [id number]. Tricuspid Valve: Tricuspid valve regurgitation is mild. Venous: The inferior vena cava is normal in size with greater than 50% respiratory variability, suggesting right atrial pressure of 3 mmHg.  LEFT VENTRICLE PLAX 2D LVOT diam:     2.10 cm  Diastology LV SV:         81       LV e' lateral:   5.61 cm/s LV SV Index:   39       LV E/e' lateral: 12.1 LVOT Area:     3.46 cm LV e' medial:    6.40 cm/s                         LV E/e' medial:  10.6  RIGHT VENTRICLE TAPSE (M-mode): 1.5 cm LEFT ATRIUM           Index       RIGHT ATRIUM           Index LA Vol (A4C): 37.4 ml 18.03 ml/m RA Area:     16.80 cm                                   RA Volume:   44.50 ml  21.45 ml/m  AORTIC VALVE LVOT Vmax:   98.60 cm/s LVOT Vmean:  69.800 cm/s LVOT VTI:    0.235 m MITRAL VALVE               TRICUSPID VALVE MV Area (PHT): 3.53 cm    TR Peak grad:   59.6 mmHg MV Decel Time: 215 msec    TR Vmax:  386.00 cm/s MV E velocity: 67.70 cm/s MV A velocity: 65.10 cm/s  SHUNTS MV E/A ratio:  1.04        Systemic VTI:  0.24 m                            Systemic Diam: 2.10 cm Skeet Latch MD Electronically signed by Skeet Latch MD Signature Date/Time: 01/15/2020/3:50:03 PM    Final

## 2020-01-21 NOTE — TOC Progression Note (Signed)
Transition of Care [facility]) - Progression Note    Patient Details  Name: [name] MRN: [id number] Date of Birth: 04/15/1956  Transition of [facility]) CM/SW [place], LCSW Phone Number: 01/21/2020, 10:41 AM  Clinical Narrative:    CSW received call from patient's daughter stating concerns with the nursing staff having patient "do things that she is unable to do" such as bathing and walking. CSW discussed concerns with Agricultural consultant, [employer].    Expected Discharge Plan: Skilled Nursing Facility Barriers to Discharge: SNF Pending bed offer, Insurance Authorization  Expected Discharge Plan and Services Expected Discharge Plan: [place] Choice: [place] arrangements for the past 2 months: Single Family Home                                       Social Determinants of Health (SDOH) Interventions    Readmission Risk Interventions Readmission Risk Prevention Plan 01/09/2020  Transportation Screening Complete  PCP or Specialist Appt within 3-5 Days Complete  [place]RI or [place] Complete  Social Work Consult for [place] Planning/Counseling Complete  Palliative Care Screening Not Applicable  Medication Review Press photographer) Complete  Some recent data might be hidden

## 2020-01-21 NOTE — Progress Notes (Signed)
Occupational Therapy Treatment Patient Details Name: [name] MRN: [id number] DOB: 07/25/1956 Today's Date: 01/21/2020    History of present illness 64 year old female admitted 12/26/2019 with SOB and cough. Patient with acute hypoxic respiratory failure due to COVID PNA and decompensatd diastolic heart failure. Of note, patient with recent hospitalization 4/18-4/23/21 for stent to L distal SFA. This admission, steroids and Remdesivir started 01/13/20. Patient with elevated troponin likely secondary to underlying CKD, not consistent with ACS. PMH: hypertension, HLD, DM-2, CKD stage IV, PVD s/p stent to left distal SFA on [id number], chronic diastolic heart failure   OT comments  Pt with slow progress towards OT goals; she continues to remain with overall weakness and notably fatigued with minimal activity. Pt tearful/upset start of this session given overall status and situation; providing gentle encouragement and support PRN throughout session with improved affect noted by end of session. Pt assisting with bed mobility (minA) and with bed egressed to chair position during session to optimize upright. Pt requiring setup/supervision for self-feeding and grooming ADL. Pt requiring increased time for rest breaks in between bouts of activity, most notably with bed mobility. Pt on RA today with VSS and SpO2 >90% throughout. Feel SNF recommendation remains appropriate at this time. Will continue to follow acutely.   Follow Up Recommendations  SNF;Supervision/Assistance - 24 hour    Equipment Recommendations  3 in 1 bedside commode;Other (comment)(TBD in next venue)          Precautions / Restrictions Precautions Precautions: Fall Restrictions Weight Bearing Restrictions: No       Mobility Bed Mobility Overal bed mobility: Needs Assistance             General bed mobility comments: minA to boost towards [facility] via trendelenburg, pt self assisting with UEs/LEs. Bed egress to chair position to  promote upright and for comfort during session   Transfers                 General transfer comment: deferred given pt fatigue    Balance                                           ADL either performed or assessed with clinical judgement   ADL Overall ADL's : Needs assistance/impaired Eating/Feeding: Sitting;Set up   Grooming: Wash/dry face;Set up;Sitting;Bed level                                 General ADL Comments: pt very tearful/upset this session given overall status and feeling like she is "being pushed too much" sometimes. provided support/encouragement as appropriate during session                       Cognition Arousal/Alertness: Awake/alert Behavior During Therapy: WFL for tasks assessed/performed(tearful) Overall Cognitive Status: No family/caregiver present to determine baseline cognitive functioning                                 General Comments: overall WFL for basic tasks, requires redirection at times, tearful today given overall status        Exercises     Shoulder Instructions       General Comments VSS on RA today    Pertinent Vitals/ Pain  Pain Assessment: Faces Faces Pain Scale: Hurts little more Pain Location: generalized discomfort, back Pain Descriptors / Indicators: Discomfort;Grimacing Pain Intervention(s): Limited activity within patient's tolerance;Monitored during session;Repositioned  Home Living                                          Prior Functioning/Environment              Frequency  Min 2X/week        Progress Toward Goals  OT Goals(current goals can now be found in the care plan section)  Progress towards OT goals: Progressing toward goals  Acute Rehab OT Goals Patient Stated Goal: get better OT Goal Formulation: With patient Time For Goal Achievement: 01/29/20 Potential to Achieve Goals: Good ADL Goals Pt Will Perform  Upper Body Bathing: with set-up;sitting Pt Will Perform Lower Body Bathing: with min assist;sit to/from stand;sitting/lateral leans Pt Will Perform Lower Body Dressing: with min assist;sitting/lateral leans Pt Will Transfer to Toilet: with supervision;stand pivot transfer;bedside commode Pt Will Perform Toileting - Clothing Manipulation and hygiene: with min guard assist;sitting/lateral leans;sit to/from stand Additional ADL Goal #1: Pt to verbalize at least 3 energy conservation strategies to implement during daily tasks to maximize ADL/mobility independence.  Plan Discharge plan remains appropriate    Co-evaluation                 AM-PAC OT "6 Clicks" Daily Activity     Outcome Measure   Help from another person eating meals?: A Little Help from another person taking care of personal grooming?: A Little Help from another person toileting, which includes using toliet, bedpan, or urinal?: A Lot Help from another person bathing (including washing, rinsing, drying)?: A Lot Help from another person to put on and taking off regular upper body clothing?: A Little Help from another person to put on and taking off regular lower body clothing?: A Lot 6 Click Score: 15    End of Session    OT Visit Diagnosis: Unsteadiness on feet (R26.81);History of falling (Z91.81);Muscle weakness (generalized) (M62.81)   Activity Tolerance Patient tolerated treatment well;Patient limited by fatigue   Patient Left in bed;with call bell/phone within reach   Nurse Communication Mobility status        Time: [id number] OT Time Calculation (min): 28 min  Charges: OT General Charges $OT Visit: 1 Visit OT Treatments $Self Care/Home Management : 23-37 mins  Lou Cal, OT Acute Rehabilitation Services Pager [phone] Office [place] 01/21/2020, 2:10 PM

## 2020-01-21 NOTE — Progress Notes (Signed)
Patient ID: [name], female   DOB: 01/18/1956, 64 y.o.   MRN: [id number] S: She is tearful today because she remains so weak and needs help with doing certain things. O:BP 132/62 (BP Location: Left Arm)   Pulse 67   Temp 97.8 F (36.6 C) (Oral)   Resp 19   Ht 5\' 1"  (1.549 m)   Wt 105.5 kg   LMP 02/16/2017 (Within Weeks) Comment: tubal ligation  SpO2 100%   BMI 43.95 kg/m   Intake/Output Summary (Last 24 hours) at 01/21/2020 1359 Last data filed at 01/21/2020 0600 Gross per 24 hour  Intake --  Output 500 ml  Net -500 ml   Intake/Output: I/O last 3 completed shifts: In: 480 [P.O.:480] Out: 500 [Urine:500]  Intake/Output this shift:  No intake/output data recorded. Weight change: -1.7 kg Gen: Obese AAF sitting in bed in NAD CVS: no rub Resp: cta Abd: +BS, soft, NT/ND Ext: minimal edema  Recent Labs  Lab 01/15/20 0528 01/16/20 [id number] 01/17/20 0831 01/18/20 0702 01/19/20 0534 01/20/20 0547 01/21/20 1115  NA 140 140 140 138 139 141 139  K 4.7 4.7 4.5 4.8 5.3* 5.3* 4.1  CL 109 109 109 107 109 107 103  CO2 19* 20* 19* 18* 18* 20* 24  GLUCOSE 253* 217* 159* 178* 227* 155* 132*  BUN 63* 81* 94* 101* 100* 96* 27*  CREATININE 2.48* 2.96* 3.11* 2.98* 2.74* 2.34* 0.95  ALBUMIN 2.2* 2.2* 2.5* 2.6* 2.7* 2.7* 2.6*  CALCIUM 8.2* 8.3* 8.6* 8.7* 8.6* 8.7* 8.0*  PHOS 5.3*  --   --   --   --   --   --   AST 57* 60* 69* 61* 63* 59* 105*  ALT 31 32 43 38 41 36 57*   Liver Function Tests: Recent Labs  Lab 01/19/20 0534 01/20/20 0547 01/21/20 1115  AST 63* 59* 105*  ALT 41 36 57*  ALKPHOS 70 67 64  BILITOT 1.0 1.0 0.8  PROT 6.1* 5.7* 5.2*  ALBUMIN 2.7* 2.7* 2.6*   No results for input(s): LIPASE, AMYLASE in the last 168 hours. No results for input(s): AMMONIA in the last 168 hours. CBC: Recent Labs  Lab 01/16/20 [id number] 01/16/20 [id number] 01/17/20 0831 01/17/20 0831 01/18/20 0702 01/18/20 0702 01/19/20 0534 01/20/20 0547 01/21/20 1115  WBC 14.7*   < > 16.8*   < > 20.4*    < > 19.9* 21.4* 25.9*  NEUTROABS 12.4*  --  13.9*  --  17.0*  --   --   --   --   HGB 10.0*   < > 10.8*   < > 11.3*   < > 11.6* 11.1* 11.6*  HCT 30.9*   < > 32.7*   < > 34.2*   < > 36.9 33.5* 37.3  MCV 92.2   < > 90.1  --  91.2  --  95.3 91.5 97.4  PLT 273   < > 323   < > 336   < > 361 355 334   < > = values in this interval not displayed.   Cardiac Enzymes: No results for input(s): CKTOTAL, CKMB, CKMBINDEX, TROPONINI in the last 168 hours. CBG: Recent Labs  Lab 01/20/20 1151 01/20/20 1620 01/20/20 2212 01/21/20 0810 01/21/20 1206  GLUCAP 185* 279* 270* 148* 174*    Iron Studies: No results for input(s): IRON, TIBC, TRANSFERRIN, FERRITIN in the last 72 hours. Studies/Results: No results found. . vitamin C  500 mg Oral Daily  . aspirin EC  81 mg  Oral Q breakfast  . atorvastatin  40 mg Oral Daily  . brimonidine  1 drop Both Eyes BID  . clopidogrel  75 mg Oral Daily  . dorzolamide-timolol  1 drop Both Eyes BID  . doxazosin  4 mg Oral QHS  . heparin injection (subcutaneous)  5,000 Units Subcutaneous Q8H  . insulin aspart  0-20 Units Subcutaneous TID WC  . insulin aspart  0-5 Units Subcutaneous QHS  . insulin glargine  26 Units Subcutaneous BID  . Ipratropium-Albuterol  1 puff Inhalation Q6H  . latanoprost  1 drop Both Eyes QHS  . linagliptin  5 mg Oral Daily  . methylPREDNISolone (SOLU-MEDROL) injection  40 mg Intravenous Q12H  . pantoprazole  40 mg Oral Q1200  . prednisoLONE acetate  1 drop Right Eye BID  . sodium bicarbonate  1,300 mg Oral BID  . sodium chloride flush  3 mL Intravenous Once  . sodium zirconium cyclosilicate  10 g Oral Daily  . zinc sulfate  220 mg Oral Daily    BMET    Component Value Date/Time   NA 139 01/21/2020 1115   NA 141 01/18/2017 1209   K 4.1 01/21/2020 1115   CL 103 01/21/2020 1115   CO2 24 01/21/2020 1115   GLUCOSE 132 (H) 01/21/2020 1115   BUN 27 (H) 01/21/2020 1115   BUN 32 (H) 01/18/2017 1209   CREATININE 0.95 01/21/2020 1115    CREATININE 1.19 (H) 01/22/2013 1949   CALCIUM 8.0 (L) 01/21/2020 1115   GFRNONAA >60 01/21/2020 1115   GFRAA >60 01/21/2020 1115   CBC    Component Value Date/Time   WBC 25.9 (H) 01/21/2020 1115   RBC 3.83 (L) 01/21/2020 1115   HGB 11.6 (L) 01/21/2020 1115   HCT 37.3 01/21/2020 1115   PLT 334 01/21/2020 1115   MCV 97.4 01/21/2020 1115   MCH 30.3 01/21/2020 1115   MCHC 31.1 01/21/2020 1115   RDW 17.8 (H) 01/21/2020 1115   LYMPHSABS 1.5 01/18/2020 0702   MONOABS 1.3 (H) 01/18/2020 0702   EOSABS 0.0 01/18/2020 0702   BASOSABS 0.0 01/18/2020 0702    Assessment/Plan:  1. AKI/CKD stage IIIa (baseline Scr 1.5)- multi-factorial in setting of covid pneumonia, IV contrast, acute on chronic diastolic disfunction. Scr has improved but BUN rising, likely due to IV steroids.  BUN/Cr improved over last 3 days.  Scr of 0.95 is likely spurious. 1. Continue to hold ACE/ARB 2. Recheck renal panel 3. Overall is improving on her own.  Will sign off for now and will be available for questions.  Will have her follow up with me in our office several weeks after her discharge. 2. Acute on chronic diastolic CHF- ok to dose with lasix 80 mg again today given rising K, edema, and SOB. Discussed with Dr. Sloan Leiter. ECHO with pulmonary HTN and RV dysfunction. Likely OSA.  Should be ok to resume her outpatient torsemide 40 mg daily 3. Covid PNA- s/p actemra and on remdesivir and steroids.  Off oxygen and good O2 saturations. 4. Hyperkalemia- due to #1. Ok to give lasix and will also add lokelma.  Continue to follow 5. Metabolic acidosis- due to #1. improved after IV lasix. 6. PAD s/p stent to left SFA on 01/08/20 with bump in Scr related to CIN.  7. HTN- BP stable. Lisinopril on hold. Lasix today. 8. Deconditioning - and debility.  Will likely require SNF with PT/OT.  [name], MD [employer] [phone]

## 2020-01-22 LAB — COMPREHENSIVE METABOLIC PANEL
ALT: 38 U/L (ref 0–44)
AST: 51 U/L — ABNORMAL HIGH (ref 15–41)
Albumin: 2.7 g/dL — ABNORMAL LOW (ref 3.5–5.0)
Alkaline Phosphatase: 74 U/L (ref 38–126)
Anion gap: 14 (ref 5–15)
BUN: 84 mg/dL — ABNORMAL HIGH (ref 8–23)
CO2: 22 mmol/L (ref 22–32)
Calcium: 8.4 mg/dL — ABNORMAL LOW (ref 8.9–10.3)
Chloride: 103 mmol/L (ref 98–111)
Creatinine, Ser: 2.1 mg/dL — ABNORMAL HIGH (ref 0.44–1.00)
GFR calc Af Amer: 28 mL/min — ABNORMAL LOW (ref >60)
GFR calc non Af Amer: 24 mL/min — ABNORMAL LOW (ref >60)
Glucose, Bld: 121 mg/dL — ABNORMAL HIGH (ref 70–99)
Potassium: 4.1 mmol/L (ref 3.5–5.1)
Sodium: 139 mmol/L (ref 135–145)
Total Bilirubin: 1.4 mg/dL — ABNORMAL HIGH (ref 0.3–1.2)
Total Protein: 5.9 g/dL — ABNORMAL LOW (ref 6.5–8.1)

## 2020-01-22 LAB — C-REACTIVE PROTEIN: CRP: 1.3 mg/dL — ABNORMAL HIGH (ref <1.0)

## 2020-01-22 LAB — CBC
HCT: 36.2 % (ref 36.0–46.0)
Hemoglobin: 12.1 g/dL (ref 12.0–15.0)
MCH: 30.6 pg (ref 26.0–34.0)
MCHC: 33.4 g/dL (ref 30.0–36.0)
MCV: 91.6 fL (ref 80.0–100.0)
Platelets: 315 10*3/uL (ref 150–400)
RBC: 3.95 MIL/uL (ref 3.87–5.11)
RDW: 18.1 % — ABNORMAL HIGH (ref 11.5–15.5)
WBC: 34.2 10*3/uL — ABNORMAL HIGH (ref 4.0–10.5)
nRBC: 1.8 % — ABNORMAL HIGH (ref 0.0–0.2)

## 2020-01-22 LAB — GLUCOSE, CAPILLARY
Glucose-Capillary: 87 mg/dL (ref 70–99)
Glucose-Capillary: 108 mg/dL — ABNORMAL HIGH (ref 70–99)
Glucose-Capillary: 134 mg/dL — ABNORMAL HIGH (ref 70–99)
Glucose-Capillary: 82 mg/dL (ref 70–99)

## 2020-01-22 LAB — D-DIMER, QUANTITATIVE: D-Dimer, Quant: 5.04 ug/mL-FEU — ABNORMAL HIGH (ref 0.00–0.50)

## 2020-01-22 NOTE — TOC Progression Note (Signed)
Transition of Care [facility]) - Progression Note    Patient Details  Name: Soley Liefer MRN: [id number] Date of Birth: 04-18-1956  Transition of Care [facility]) CM/SW [place], LCSW Phone Number: 01/22/2020, 9:56 AM  Clinical Narrative:    Miquel Dunn still awaiting insurance approval.    Expected Discharge Plan: [place] Barriers to Discharge: SNF Pending bed offer, Insurance Authorization  Expected Discharge Plan and Services Expected Discharge Plan: [place] Choice: [place] arrangements for the past 2 months: Single Family Home                                       Social Determinants of Health (SDOH) Interventions    Readmission Risk Interventions Readmission Risk Prevention Plan 01/09/2020  Transportation Screening Complete  PCP or Specialist Appt within 3-5 Days Complete  HRI or [place] Complete  Social Work Consult for [place] Planning/Counseling Complete  Palliative Care Screening Not Applicable  Medication Review [employer]) Complete  Some recent data might be hidden

## 2020-01-22 NOTE — Progress Notes (Signed)
PROGRESS NOTE                                                                                                                                                                                                             Patient Demographics:    [name], is a 64 y.o. female, DOB - Nov 28, 1955, [id number]  Outpatient Primary MD for the patient is Pavelock, Ralene Bathe, MD   Admit date - 12/27/2019   LOS - 9  Chief Complaint  Patient presents with  . Shortness of Breath  . Chest Pain       Brief Narrative: Patient is a 64 y.o. female  PMHx of hypertension, HLD, DM-2, CKD stage IV, PVD s/p stent to left distal SFA on [id number], chronic diastolic heart failure-presented with shortness of breath or cough-found to have acute hypoxic respiratory failure secondary to COVID-19 pneumonia and decompensated diastolic heart failure.  Significant Events: 4/18-4/23>> admit to [facility]-s/p stent to left distal SFA 4/27>> admit to [facility] for hypoxia from Covid/heart failure 4/27>> lower extremity Dopplers negative for DVT. [id number] grade 2 diastolic dysfunction, moderate RV systolic dysfunction.  RVSP 62.6 mmHg. 4/30>> VQ scan-low probability PE  COVID-19 medications: Steroids: 4/27>> Remdesivir: 4/27>> 5/1  Antibiotics: Vancomycin: 4/27 x 1 Cefepime: 4/27 x 1  Microbiology data: 4/27: Blood culture>> no growth 4/28: Blood culture>> no growth  DVT prophylaxis: SQ heparin  Procedures: None  Consults: None    Subjective:   Continues to improve-on room air this morning..   Assessment  & Plan :   Acute Hypoxic Resp Failure due to Covid 19 Viral pneumonia and decompensated diastolic heart failure: Significantly improved-now on room air.  Continue supportive care-VQ scan without PE, lower extremity Dopplers negative.  Continued attempts to slowly titrate down FiO2.    COVID-19 pneumonia: Hypoxia has improved-has completed a  course of remdesivir, s/p Actemra on 4/30-Solu-Medrol-switch to prednisone-with plans for a quick taper.  CRP downtrending.  Continue to follow-continue supportive care.   Fever: afebrile  O2 requirements:  SpO2: 99 % O2 Flow Rate (L/min): 3 L/min   COVID-19 Labs: Recent Labs    01/20/20 0547 01/21/20 1115 01/22/20 0739  DDIMER 4.79*  --  5.04*  CRP 2.5* 5.7* 1.3*       Component Value Date/Time   BNP 134.9 (H) 01/17/2020 0831    No results  for input(s): PROCALCITON in the last 168 hours.  Lab Results  Component Value Date   [place] (A) 01/13/2020   [place] NEGATIVE 01/05/2020   [place] Not Detected 11/24/2019     Prone/Incentive Spirometry: encouraged  incentive spirometry use 3-4/hour.  Decompensated diastolic heart failure: Volume status better-was on IV diuretics for a few days-currently on Demadex.  Continue to monitor volume status/renal function.   Pulmonary hypertension/RV dysfunction: Not seen in prior echo's-I suspect this is probably from OSA-she has been noncompliant to CPAP, but given her worsening hypoxemia-she underwent a VQ scan-which was low probability for PE.  Lower extremity Dopplers negative.  Briefly on IV heparin-but have switched back to prophylactic dosing.   AKI on CKD stage IV: Improving and back to baseline-AKI likely hemodynamically mediated.  Renal ultrasound without any hydronephrosis.    Acute metabolic encephalopathy: Resolved-back to baseline.  Likely secondary to hypoxemia and AKI.  Continue supportive care.   Elevated troponin: Trend is flat-not consistent with ACS-likely secondary to underlying CKD-with some component from demand ischemia due to COVID-19/heart failure.  PAD-s/p stent to left SFA on 4/22: Both lower extremities appear warm-continue aspirin, Plavix and statin.  HTN: BP stable-continue Demadex-Cardura and Lasix remain on hold.   DM-2 with hyperglycemia (A1c 12.2 on 4/19): CBGs relatively  stable-continue Lantus 26 units twice daily-SSI.  Follow and optimize.  Recent Labs    01/21/20 1659 01/21/20 2137 01/22/20 0806  GLUCAP 209* 240* 87    Thrombocytopenia: Resolved.  Leukocytosis: Secondary to steroids-no evidence of any superimposed bacterial infection.  Anemia of chronic disease: Close to baseline-likely related to CKD.  Gout: No flare-hold colchicine for now given AKI.  OSA: Noncompliant to CPAP-continue CPAP nightly if possible in the hospital.  Obesity: Estimated body mass index is 42.86 kg/m as calculated from the following:   Height as of this encounter: 5\' 1"  (1.549 m).   Weight as of this encounter: 102.9 kg.   ABG:    Component Value Date/Time   PHART 7.460 (H) 09/30/2008 1120   PCO2ART 41.0 09/30/2008 1120   PO2ART 83.0 09/30/2008 1120   HCO3 26.4 12/06/2016 0922   HCO3 26.1 12/06/2016 0922   TCO2 28 12/06/2016 0922   TCO2 28 12/06/2016 0922   O2SAT 69.0 12/06/2016 0922   O2SAT 67.0 12/06/2016 0922    Vent Settings: N/A  Condition - Guarded  Family Communication  : Spoke with daughter-5/6  Code Status :  Full Code  Diet :  Diet Order            Diet heart healthy/carb modified Room service appropriate? Yes; Fluid consistency: Thin; Fluid restriction: 2000 mL Fluid  Diet effective now               Disposition Plan  :   Status is: Inpatient  Remains inpatient appropriate because:Inpatient level of care appropriate due to severity of illness  Dispo: The patient is from: Home              Anticipated d/c is to: Home              Anticipated d/c date is: > 3 days              Patient currently is medically stable to d/c.  Barriers to discharge: awaiting SNF bed  Antimicorbials  :    Anti-infectives (From admission, onward)   Start     Dose/Rate Route Frequency Ordered Stop   01/15/20 1200  vancomycin (VANCOREADY) IVPB 1250 mg/250 mL  Status:  Discontinued     1,250 mg 166.7 mL/hr over 90 Minutes Intravenous Every 48  hours 01/13/20 1018 01/14/20 1038   01/14/20 1000  remdesivir 100 mg in sodium chloride 0.9 % 100 mL IVPB     100 mg 200 mL/hr over 30 Minutes Intravenous Daily 01/13/20 1129 01/17/20 1113   01/13/20 1630  valACYclovir (VALTREX) tablet 1,000 mg  Status:  Discontinued     1,000 mg Oral 3 times daily 01/13/20 1557 01/17/20 1147   01/13/20 1230  remdesivir 200 mg in sodium chloride 0.9% 250 mL IVPB     200 mg 580 mL/hr over 30 Minutes Intravenous Once 01/13/20 1129 01/13/20 1800   01/13/20 1015  ceFEPIme (MAXIPIME) 2 g in sodium chloride 0.9 % 100 mL IVPB     2 g 200 mL/hr over 30 Minutes Intravenous  Once 01/13/20 1003 01/13/20 1434   01/13/20 1015  vancomycin (VANCOREADY) IVPB 2000 mg/400 mL     2,000 mg 200 mL/hr over 120 Minutes Intravenous  Once 01/13/20 1012 01/13/20 2258      Inpatient Medications  Scheduled Meds: . vitamin C  500 mg Oral Daily  . aspirin EC  81 mg Oral Q breakfast  . atorvastatin  40 mg Oral Daily  . brimonidine  1 drop Both Eyes BID  . clopidogrel  75 mg Oral Daily  . dorzolamide-timolol  1 drop Both Eyes BID  . doxazosin  4 mg Oral QHS  . heparin injection (subcutaneous)  5,000 Units Subcutaneous Q8H  . insulin aspart  0-20 Units Subcutaneous TID WC  . insulin aspart  0-5 Units Subcutaneous QHS  . insulin glargine  26 Units Subcutaneous BID  . Ipratropium-Albuterol  1 puff Inhalation Q6H  . latanoprost  1 drop Both Eyes QHS  . linagliptin  5 mg Oral Daily  . pantoprazole  40 mg Oral Q1200  . prednisoLONE acetate  1 drop Right Eye BID  . predniSONE  40 mg Oral Q breakfast  . sodium bicarbonate  1,300 mg Oral BID  . sodium chloride flush  3 mL Intravenous Once  . sodium zirconium cyclosilicate  10 g Oral Daily  . torsemide  40 mg Oral Daily  . zinc sulfate  220 mg Oral Daily   Continuous Infusions:  PRN Meds:.acetaminophen, chlorpheniramine-HYDROcodone, guaiFENesin-dextromethorphan, ondansetron **OR** ondansetron (ZOFRAN) IV,  oxyCODONE-acetaminophen   Time Spent in minutes  25  See all Orders from today for further details   Oren Binet M.D on 01/22/2020 at 11:38 AM  To page go to [email or web address] - use universal password  [facility] -  Office  [phone]    Objective:   Vitals:   01/21/20 1100 01/21/20 1424 01/21/20 2111 01/22/20 0447  BP: 132/62 (!) 133/55 (!) 148/76 (!) 153/50  Pulse: 67 72 86 76  Resp:  (!) 22 20 20   Temp: 97.8 F (36.6 C) 97.8 F (36.6 C) 98.2 F (36.8 C) 97.8 F (36.6 C)  TempSrc: Oral Oral Oral Oral  SpO2: 100% 99% 100% 99%  Weight:    102.9 kg  Height:        Wt Readings from Last 3 Encounters:  01/22/20 102.9 kg  01/08/20 115.6 kg  07/15/19 102.9 kg     Intake/Output Summary (Last 24 hours) at 01/22/2020 1138 Last data filed at 01/22/2020 0448 Gross per 24 hour  Intake 240 ml  Output 1400 ml  Net -1160 ml    Physical Exam Gen Exam:Alert awake-not in any distress HEENT:atraumatic, normocephalic Chest: B/L clear  to auscultation anteriorly CVS:S1S2 regular Abdomen:soft non tender, non distended Extremities:+ edema Neurology: Non focal Skin: no rash   Data Review:    CBC Recent Labs  Lab 01/16/20 0632 01/16/20 [id number] 01/17/20 0831 01/18/20 0702 01/19/20 0534 01/20/20 0547 01/21/20 1115  WBC 14.7*   < > 16.8* 20.4* 19.9* 21.4* 25.9*  HGB 10.0*   < > 10.8* 11.3* 11.6* 11.1* 11.6*  HCT 30.9*   < > 32.7* 34.2* 36.9 33.5* 37.3  PLT 273   < > 323 336 361 355 334  MCV 92.2   < > 90.1 91.2 95.3 91.5 97.4  MCH 29.9   < > 29.8 30.1 30.0 30.3 30.3  MCHC 32.4   < > 33.0 33.0 31.4 33.1 31.1  RDW 16.9*   < > 16.7* 16.9* 17.2* 16.7* 17.8*  LYMPHSABS 1.3  --  1.4 1.5  --   --   --   MONOABS 0.8  --  1.1* 1.3*  --   --   --   EOSABS 0.0  --  0.0 0.0  --   --   --   BASOSABS 0.0  --  0.0 0.0  --   --   --    < > = values in this interval not displayed.    Chemistries  Recent Labs  Lab 01/16/20 [id number] 01/16/20 [id number] 01/17/20 0831 01/17/20 0831  01/18/20 0702 01/19/20 0534 01/20/20 0547 01/21/20 1115 01/22/20 1042  NA 140   < > 140   < > 138 139 141 139 139  K 4.7   < > 4.5   < > 4.8 5.3* 5.3* 4.1 4.1  CL 109   < > 109   < > 107 109 107 103 103  CO2 20*   < > 19*   < > 18* 18* 20* 24 22  GLUCOSE 217*   < > 159*   < > 178* 227* 155* 132* 121*  BUN 81*   < > 94*   < > 101* 100* 96* 27* 84*  CREATININE 2.96*   < > 3.11*   < > 2.98* 2.74* 2.34* 0.95 2.10*  CALCIUM 8.3*   < > 8.6*   < > 8.7* 8.6* 8.7* 8.0* 8.4*  MG 2.1  --  2.4  --  2.4  --   --   --   --   AST 60*   < > 69*   < > 61* 63* 59* 105* 51*  ALT 32   < > 43   < > 38 41 36 57* 38  ALKPHOS 59   < > 71   < > 73 70 67 64 74  BILITOT 0.9   < > 0.9   < > 0.8 1.0 1.0 0.8 1.4*   < > = values in this interval not displayed.   ------------------------------------------------------------------------------------------------------------------ No results for input(s): CHOL, HDL, LDLCALC, TRIG, CHOLHDL, LDLDIRECT in the last 72 hours.  Lab Results  Component Value Date   HGBA1C 12.2 (H) 01/05/2020   ------------------------------------------------------------------------------------------------------------------ No results for input(s): TSH, T4TOTAL, T3FREE, THYROIDAB in the last 72 hours.  Invalid input(s): FREET3 ------------------------------------------------------------------------------------------------------------------ No results for input(s): VITAMINB12, FOLATE, FERRITIN, TIBC, IRON, RETICCTPCT in the last 72 hours.  Coagulation profile No results for input(s): INR, PROTIME in the last 168 hours.  Recent Labs    01/20/20 0547 01/22/20 0739  DDIMER 4.79* 5.04*    Cardiac Enzymes No results for input(s): CKMB, TROPONINI, MYOGLOBIN in the last 168 hours.  Invalid input(s):  CK ------------------------------------------------------------------------------------------------------------------    Component Value Date/Time   BNP 134.9 (H) 01/17/2020 0831     Micro Results Recent Results (from the past 240 hour(s))  Respiratory Panel by RT PCR (Flu A&B, Covid) - Nasopharyngeal Swab     Status: Abnormal   Collection Time: 01/13/20 10:25 AM   Specimen: Nasopharyngeal Swab  Result Value Ref Range Status   SARS Coronavirus 2 by RT PCR POSITIVE (A) NEGATIVE Final    Comment: RESULT CALLED TO, READ BACK BY AND VERIFIED WITH: Sheran Lawless RN 12:25 01/13/20 ([email or web address]) (NOTE) SARS-CoV-2 target nucleic acids are DETECTED. SARS-CoV-2 RNA is generally detectable in upper respiratory specimens  during the acute phase of infection. Positive results are indicative of the presence of the identified virus, but do not rule out bacterial infection or co-infection with other pathogens not detected by the test. Clinical correlation with patient history and other diagnostic information is necessary to determine patient infection status. The expected result is Negative. Fact Sheet for Patients:  PinkCheek.be Fact Sheet for Healthcare Providers: GravelBags.it This test is not yet approved or cleared by the [place] FDA and  has been authorized for detection and/or diagnosis of SARS-CoV-2 by FDA under an Emergency Use Authorization (EUA).  This EUA will remain in effect (meaning this test can be used)  for the duration of  the COVID-19 declaration under Section 564(b)(1) of the Act, 21 U.S.C. section 360bbb-3(b)(1), unless the authorization is terminated or revoked sooner.    Influenza A by PCR NEGATIVE NEGATIVE Final   Influenza B by PCR NEGATIVE NEGATIVE Final    Comment: (NOTE) The Xpert Xpress SARS-CoV-2/FLU/RSV assay is intended as an aid in  the diagnosis of influenza from Nasopharyngeal swab specimens and  should not be used as a sole basis for treatment. Nasal washings and  aspirates are unacceptable for Xpert Xpress SARS-CoV-2/FLU/RSV  testing. Fact Sheet for  Patients: PinkCheek.be Fact Sheet for Healthcare Providers: GravelBags.it This test is not yet approved or cleared by the [place] FDA and  has been authorized for detection and/or diagnosis of SARS-CoV-2 by  FDA under an Emergency Use Authorization (EUA). This EUA will remain  in effect (meaning this test can be used) for the duration of the  Covid-19 declaration under Section 564(b)(1) of the Act, 21  U.S.C. section 360bbb-3(b)(1), unless the authorization is  terminated or revoked. Performed at [facility], [place] [address], [place], [place] [address]   Blood culture (routine x 2)     Status: None   Collection Time: 01/13/20 11:10 AM   Specimen: BLOOD  Result Value Ref Range Status   Specimen Description BLOOD LEFT ANTECUBITAL  Final   Special Requests   Final    BOTTLES DRAWN AEROBIC AND ANAEROBIC Blood Culture results may not be optimal due to an inadequate volume of blood received in culture bottles   Culture   Final    NO GROWTH 5 DAYS Performed at [facility], [place] [address], [place], [place] [address]    Report Status 01/18/2020 FINAL  Final  Blood culture (routine x 2)     Status: None   Collection Time: 01/14/20  8:51 AM   Specimen: BLOOD LEFT HAND  Result Value Ref Range Status   Specimen Description BLOOD LEFT HAND  Final   Special Requests   Final    BOTTLES DRAWN AEROBIC AND ANAEROBIC Blood Culture results may not be optimal due to an inadequate volume of blood received in culture bottles  Culture   Final    NO GROWTH 5 DAYS Performed at [facility], [place] [address] [place], [place] [address]    Report Status 01/19/2020 FINAL  Final    Radiology Reports DG Chest 2 View  Result Date: 01/13/2020 CLINICAL DATA:  Shortness of breath and bilateral leg swelling. EXAM: CHEST - 2 VIEW COMPARISON:  November 30, 2016 FINDINGS: Moderate severity diffuse multifocal infiltrates are seen,  bilaterally. There is no evidence of a pleural effusion or pneumothorax. The cardiac silhouette is mildly enlarged. A radiopaque fusion plate and screws are seen overlying the lower cervical spine. The visualized skeletal structures are otherwise unremarkable. Radiopaque surgical clips are seen within the right upper quadrant. IMPRESSION: Moderate severity bilateral multifocal infiltrates. Electronically Signed   By: Virgina Norfolk M.D.   On: 01/13/2020 00:27   NM Pulmonary Perfusion  Result Date: 01/16/2020 CLINICAL DATA:  Shortness of breath, history of COVID-19 positivity EXAM: NUCLEAR MEDICINE PERFUSION LUNG SCAN TECHNIQUE: Perfusion images were obtained in multiple projections after intravenous injection of radiopharmaceutical. Ventilation scans intentionally deferred if perfusion scan and chest x-ray adequate for interpretation during COVID 19 epidemic. RADIOPHARMACEUTICALS:  1.6 mCi Tc-84m MAA IV COMPARISON:  Chest x-ray from earlier in the same day. FINDINGS: Perfusion images demonstrate adequate uptake throughout both lungs despite the changes seen on recent chest x-ray. No focal defect to suggest pulmonary embolism is noted. IMPRESSION: No evidence of pulmonary embolism. Electronically Signed   By: Inez Catalina M.D.   On: 01/16/2020 16:23   US RENAL  Result Date: 01/16/2020 CLINICAL DATA:  Acute renal insufficiency. EXAM: RENAL / URINARY TRACT ULTRASOUND COMPLETE COMPARISON:  None. FINDINGS: Exam difficult due to patient body habitus. Right Kidney: Renal measurements: 9.4 x 4.2 x 4.5 cm = volume: 94 mL . Echogenicity within normal limits. Mild cortical thinning. No mass or hydronephrosis visualized. Left Kidney: Renal measurements: 10.1 x 5.2 x 5.5 cm = volume: 150 mL. Echogenicity within normal limits. Mild cortical thinning. No mass or hydronephrosis visualized. Bladder: Not visualized. Other: None. IMPRESSION: Normal size kidneys without hydronephrosis. Electronically Signed   By: Marin Olp M.D.   On: 01/16/2020 16:38   US RENAL  Result Date: 01/06/2020 CLINICAL DATA:  Chronic kidney disease EXAM: RENAL / URINARY TRACT ULTRASOUND COMPLETE COMPARISON:  None. FINDINGS: Right Kidney: Renal measurements: 9.3 x 4.3 x 4.6 cm = volume: 96 mL. Lobulated contour. No hydronephrosis. Normal echogenicity. Left Kidney: Renal measurements: 10.7 x 5.0 x 5.2 cm = volume: 145 mL. Lobulated contour without hydronephrosis. Normal echogenicity. Bladder: Decompressed. Other: Visualization is generally limited by body habitus. IMPRESSION: No hydronephrosis. Lobulated renal contours without focal parenchymal abnormality. Electronically Signed   By: Ulyses Jarred M.D.   On: 01/06/2020 16:16   PERIPHERAL VASCULAR CATHETERIZATION  Result Date: 01/08/2020 Patient name: [name]   MRN: [id number]        DOB: July 09, 1956            Sex: female  01/08/2020 Pre-operative Diagnosis: Critical limb ischemia of the left lower extremity with tissue loss Post-operative diagnosis:  Same Surgeon:  Marty Heck, MD Procedure Performed: 1.  Ultrasound guided access of the right common femoral artery 2.  Aortogram with CO2 including catheter selection of the aorta 3.  Left lower extremity arteriogram with selection of third order branches 4.  Left distal SFA above-knee popliteal artery angioplasty with stent placement (primarily stented with a 6 mm x 60 mm drug-coated Eluvia postdilated with a 5 mm Mustang) 5.  Left anterior tibial angioplasty for focal perforation with resolution of the perforation (3 mm [place]) 6.  Mynx closure of the right common femoral artery 7.  55 minutes of monitored moderate conscious sedation time   Indications: Patient is a 64 year old female who was seen in consultation by Dr. Trula Slade for left lower extremity rest pain with severely depressed ABIs and duplex showing likely popliteal disease.  She presents today for left lower extremity arteriogram after risk benefits discussed.   Findings:  Aortogram was performed with CO2 to limit contrast and there was no flow-limiting aortoiliac stenosis.  Left lower extremity arteriogram showed a patent common femoral profunda and widely patent SFA in the proximal to mid segment.  At [facility] canal in the distal SFA and above-knee popliteal artery showed a very short focal occlusion with large collateral around this.  She had a patent below-knee popliteal artery with three-vessel runoff.  Anterior tibial appeared to be dominant runoff.   Ultimately this lesion was crossed in the distal SFA/above knee popliteal artery and primarily stented with a 6 mm x 60 mm Eluvia.  She now has excellent inline flow down the left lower extremity with no residual stenosis and three-vessel runoff.  There was a focal perforation in the anterior tibial where our wire likely made a perforation during exchange of balloons and stents.  Ultimately this was treated with a very low inflation of a 3 mm [place] for 3 minutes with resolution.  Patient has preserved three vessel runoff.              Procedure:  The patient was identified in the holding area and taken to room 8.  The patient was then placed supine on the table and prepped and draped in the usual sterile fashion.  A time out was called.  Ultrasound was used to evaluate the right common femoral artery.  It was patent .  A digital ultrasound image was acquired.  A micropuncture needle was used to access the right common femoral artery under ultrasound guidance.  An 018 wire was advanced without resistance and a micropuncture sheath was placed.  The 018 wire was removed and a benson wire was placed.  The micropuncture sheath was exchanged for a 5 french sheath.  An omniflush catheter was advanced over the wire to the level of L-1.  An abdominal angiogram was obtained.  Next, using glidewire and straight 4 French flush catheter the aortic bifurcation was cross because the Omni would not track.  Placed the catheter into  theleft external iliac artery and left runoff was obtained.  Ultimately after evaluating the pictures elected to intervene on the distal left SFA above-knee popliteal occlusion.  Used a long Glidewire advantage down the SFA and exchanged for a 6 [place] Ansell sheath in the right groin over the aortic bifurcation.  Patient was given 100 units per kilogram heparin.  I then primarily stented this lesion with a 6 mm x 60 mm Eluvia that was postdilated with a 5 mm x 60 mm Mustang.  Final pictures showed no residual stenosis with excellent inline flow down the left lower extremity and three-vessel runoff.  There was a focal perforation in the proximal anterior tibial that I think was from our wire.  I then exchanged for a V 18 wire down the anterior tibial and then did a very slow inflation with a 3 mm [place] in the left anterior tibial artery for 3 minutes.  There appeared to be resolution of the perforation after taking  multiple additional pictures.  Wires and catheters removed and a short 6 French sheath was placed in the right groin.  We placed a mynx closure device.   Plan: Patient will be loaded on Plavix and will need to continue aspirin and Plavix.  Should have inline flow down left leg now.   Marty Heck, MD [facility] Office: [phone]   DG Chest Port 1V same Day  Result Date: 01/16/2020 CLINICAL DATA:  Shortness of breath.  COVID. EXAM: PORTABLE CHEST 1 VIEW COMPARISON:  Three days ago FINDINGS: More confluent bilateral interstitial and airspace opacity. Cardiomegaly. No visible effusion or pneumothorax. IMPRESSION: Worsening bilateral pneumonia. Electronically Signed   By: Monte Fantasia M.D.   On: 01/16/2020 08:34   DG Foot Complete Left  Result Date: 01/05/2020 CLINICAL DATA:  Swelling EXAM: LEFT FOOT - COMPLETE 3+ VIEW COMPARISON:  None. FINDINGS: Degenerative changes in the 1st MTP joint. No acute bony abnormality. Specifically, no fracture,  subluxation, or dislocation. Vascular calcifications noted. No bone destruction seen to suggest osteomyelitis. IMPRESSION: No acute bony abnormality. Electronically Signed   By: Rolm Baptise M.D.   On: 01/05/2020 01:25   VAS [place] ABI WITH/WO TBI  Result Date: 01/05/2020 LOWER EXTREMITY DOPPLER STUDY Indications: Blue, cold toes. High Risk Factors: Hypertension, Diabetes.  Limitations: Today's exam was limited due to Patient body habitus. Comparison Study: No prior studies. Performing Technologist: Carlos Levering Rvt  Examination Guidelines: A complete evaluation includes at minimum, Doppler waveform signals and systolic blood pressure reading at the level of bilateral brachial, anterior tibial, and posterior tibial arteries, when vessel segments are accessible. Bilateral testing is considered an integral part of a complete examination. Photoelectric Plethysmograph (PPG) waveforms and toe systolic pressure readings are included as required and additional duplex testing as needed. Limited examinations for reoccurring indications may be performed as noted.  ABI Findings: +---------+------------------+-----+---------+--------+ Right    Rt Pressure (mmHg)IndexWaveform Comment  +---------+------------------+-----+---------+--------+ Brachial 150                    triphasic         +---------+------------------+-----+---------+--------+ PTA      108               0.72 biphasic          +---------+------------------+-----+---------+--------+ DP       137               0.91 triphasic         +---------+------------------+-----+---------+--------+ Great Toe76                0.51                   +---------+------------------+-----+---------+--------+ +---------+------------------+-----+----------+-------+ Left     Lt Pressure (mmHg)IndexWaveform  Comment +---------+------------------+-----+----------+-------+ Brachial 136                    triphasic          +---------+------------------+-----+----------+-------+ PTA      66                0.44 monophasic        +---------+------------------+-----+----------+-------+ DP       53                0.35 monophasic        +---------+------------------+-----+----------+-------+ Great Toe0                 0.00                   +---------+------------------+-----+----------+-------+ +-------+-----------+-----------+------------+------------+  ABI/TBIToday's ABIToday's TBIPrevious ABIPrevious TBI +-------+-----------+-----------+------------+------------+ Right  0.91       0.51                                +-------+-----------+-----------+------------+------------+ Left   0.44       0                                   +-------+-----------+-----------+------------+------------+  Summary: Right: Resting right ankle-brachial index indicates mild right lower extremity arterial disease. The right toe-brachial index is abnormal. Left: Resting left ankle-brachial index indicates severe left lower extremity arterial disease. The left toe-brachial index is abnormal.  *See table(s) above for measurements and observations.  Electronically signed by [name] MD on 01/05/2020 at 7:35:40 PM.    Final    VAS [place] LOWER EXTREMITY ARTERIAL DUPLEX  Result Date: 01/05/2020 LOWER EXTREMITY ARTERIAL DUPLEX STUDY Indications: Blue, cold toes. High Risk Factors: Hypertension, Diabetes.  Current ABI: R 0.91 L 0.44 Limitations: Patient body habitus, poor ultrasound/tissue interface. Comparison Study: No prior studies. Performing Technologist: Oliver Hum RVT  Examination Guidelines: A complete evaluation includes B-mode imaging, spectral Doppler, color Doppler, and power Doppler as needed of all accessible portions of each vessel. Bilateral testing is considered an integral part of a complete examination. Limited examinations for reoccurring indications may be performed as noted.   +-----------+--------+-----+--------+----------+------------------+ LEFT       PSV cm/sRatioStenosisWaveform  Comments           +-----------+--------+-----+--------+----------+------------------+ EIA Distal 116                  biphasic                     +-----------+--------+-----+--------+----------+------------------+ CFA Mid    132                  biphasic                     +-----------+--------+-----+--------+----------+------------------+ DFA        111                  biphasic                     +-----------+--------+-----+--------+----------+------------------+ SFA Prox   104                  biphasic                     +-----------+--------+-----+--------+----------+------------------+ SFA Mid    85                   biphasic                     +-----------+--------+-----+--------+----------+------------------+ SFA Distal 91                   biphasic                     +-----------+--------+-----+--------+----------+------------------+ POP Prox   26                   monophasic                   +-----------+--------+-----+--------+----------+------------------+ POP Distal 0  Unable to insonate +-----------+--------+-----+--------+----------+------------------+ ATA Prox   73                   biphasic                     +-----------+--------+-----+--------+----------+------------------+ ATA Mid    33                   monophasic                   +-----------+--------+-----+--------+----------+------------------+ ATA Distal 32                   monophasic                   +-----------+--------+-----+--------+----------+------------------+ PTA Prox                                  Unable to insonate +-----------+--------+-----+--------+----------+------------------+ PTA Mid    27                   monophasic                    +-----------+--------+-----+--------+----------+------------------+ PTA Distal 9                    monophasic                   +-----------+--------+-----+--------+----------+------------------+ PERO Prox                                 Unable to insonate +-----------+--------+-----+--------+----------+------------------+ PERO Mid                                  Unable to insonate +-----------+--------+-----+--------+----------+------------------+ PERO Distal15                   monophasic                   +-----------+--------+-----+--------+----------+------------------+ DP         29                   monophasic                   +-----------+--------+-----+--------+----------+------------------+  Summary: Left: The external iliac, common femoral, and superficial femoral arteries appear patent with no significant stenosis. Monophasic waveforms are noted in the proximal popliteal artery with poor visualization of the distal segment. Which suggests a possible occlusion. Anterior tibial artery appears patent throughout with monophasic flow. The posterior tibial, and peroneal arteries demonstrated monophasic flow with likely areas of occlusive disease. The proximal calf arteries were difficult to visualize.  See table(s) above for measurements and observations. Electronically signed by [name] MD on 01/05/2020 at 51:34:41 PM.    Final    VAS [place] LOWER EXTREMITY VENOUS (DVT)  Result Date: 01/13/2020  Lower Venous DVTStudy Indications: Swelling.  Limitations: Body habitus and poor ultrasound/tissue interface. Performing Technologist: Antonieta Pert RDMS, RVT  Examination Guidelines: A complete evaluation includes B-mode imaging, spectral Doppler, color Doppler, and power Doppler as needed of all accessible portions of each vessel. Bilateral testing is considered an integral part of a complete examination. Limited examinations for reoccurring indications may be performed  as noted. The  reflux portion of the exam is performed with the patient in reverse Trendelenburg.  +---------+---------------+---------+-----------+----------+-------------------+ RIGHT    CompressibilityPhasicitySpontaneityPropertiesThrombus Aging      +---------+---------------+---------+-----------+----------+-------------------+ CFV      Partial        Yes      Yes                  patient body                                                              habitus and                                                               position limiting                                                         evaluation          +---------+---------------+---------+-----------+----------+-------------------+ SFJ      Full                                                             +---------+---------------+---------+-----------+----------+-------------------+ FV Prox                          Yes                                      +---------+---------------+---------+-----------+----------+-------------------+ FV Mid                           Yes                                      +---------+---------------+---------+-----------+----------+-------------------+ FV Distal                        Yes                                      +---------+---------------+---------+-----------+----------+-------------------+ PFV      Full                                                             +---------+---------------+---------+-----------+----------+-------------------+ POP  Full                                                             +---------+---------------+---------+-----------+----------+-------------------+ PTV                                                   not clearly                                                               visualized           +---------+---------------+---------+-----------+----------+-------------------+ PERO                                                  not clearly                                                               visualized          +---------+---------------+---------+-----------+----------+-------------------+   +---------+---------------+---------+-----------+----------+------------------+ LEFT     CompressibilityPhasicitySpontaneityPropertiesThrombus Aging     +---------+---------------+---------+-----------+----------+------------------+ CFV      Full           Yes      Yes                                     +---------+---------------+---------+-----------+----------+------------------+ SFJ      Full                                                            +---------+---------------+---------+-----------+----------+------------------+ FV Prox  Full                                                            +---------+---------------+---------+-----------+----------+------------------+ FV Mid   Full                                                            +---------+---------------+---------+-----------+----------+------------------+ FV Distal  Yes                                     +---------+---------------+---------+-----------+----------+------------------+ PFV      Full                                                            +---------+---------------+---------+-----------+----------+------------------+ POP      Full           Yes      Yes                                     +---------+---------------+---------+-----------+----------+------------------+ PTV                                                   not clearly                                                              visualized         +---------+---------------+---------+-----------+----------+------------------+ PERO                                                   not clearly                                                              visualized         +---------+---------------+---------+-----------+----------+------------------+    Summary: RIGHT: - There is no evidence of deep vein thrombosis in the lower extremity. However, portions of this examination were limited- see technologist comments above.  LEFT: - There is no evidence of deep vein thrombosis in the lower extremity. However, portions of this examination were limited- see technologist comments above.  - Prominent lymph nodes noted bilateral groin areas.  *See table(s) above for measurements and observations. Electronically signed by Deitra Mayo MD on 01/13/2020 at 6:50:27 PM.    Final    ECHOCARDIOGRAM LIMITED  Result Date: 01/15/2020    ECHOCARDIOGRAM LIMITED REPORT   Patient Name:   [name] Date of Exam: 01/15/2020 Medical Rec #:  [id number]     Height:       61.0 in Accession #:    [id number]    Weight:       249.3 lb Date of Birth:  12-16-1955      BSA:          2.074 m Patient Age:    59 years      BP:  131/70 mmHg Patient Gender: F             HR:           53 bpm. Exam Location:  Inpatient Procedure: Limited Echo, Limited Color Doppler and Cardiac Doppler Indications:    Acute Respiratory Insufficiency R06.89  History:        Patient has prior history of Echocardiogram examinations, most                 recent 12/02/2016. Risk Factors:Hypertension and Dyslipidemia.  Sonographer:    Mikki Santee RDCS (AE) Referring Phys: [place]  1. Left ventricular diastolic parameters are consistent with Grade II diastolic dysfunction (pseudonormalization).  2. Right ventricular systolic function is moderately reduced. There is severely elevated pulmonary artery systolic pressure.  3. The inferior vena cava is normal in size with greater than 50% respiratory variability, suggesting right atrial pressure of 3 mmHg. FINDINGS   Left Ventricle: Indeterminate filling pressures. Right Ventricle: Right ventricular systolic function is moderately reduced. There is severely elevated pulmonary artery systolic pressure. The tricuspid regurgitant velocity is 3.86 m/s, and with an assumed right atrial pressure of 3 mmHg, the estimated right ventricular systolic pressure is [id number]. Tricuspid Valve: Tricuspid valve regurgitation is mild. Venous: The inferior vena cava is normal in size with greater than 50% respiratory variability, suggesting right atrial pressure of 3 mmHg.  LEFT VENTRICLE PLAX 2D LVOT diam:     2.10 cm  Diastology LV SV:         81       LV e' lateral:   5.61 cm/s LV SV Index:   39       LV E/e' lateral: 12.1 LVOT Area:     3.46 cm LV e' medial:    6.40 cm/s                         LV E/e' medial:  10.6  RIGHT VENTRICLE TAPSE (M-mode): 1.5 cm LEFT ATRIUM           Index       RIGHT ATRIUM           Index LA Vol (A4C): 37.4 ml 18.03 ml/m RA Area:     16.80 cm                                   RA Volume:   44.50 ml  21.45 ml/m  AORTIC VALVE LVOT Vmax:   98.60 cm/s LVOT Vmean:  69.800 cm/s LVOT VTI:    0.235 m MITRAL VALVE               TRICUSPID VALVE MV Area (PHT): 3.53 cm    TR Peak grad:   59.6 mmHg MV Decel Time: 215 msec    TR Vmax:        386.00 cm/s MV E velocity: 67.70 cm/s MV A velocity: 65.10 cm/s  SHUNTS MV E/A ratio:  1.04        Systemic VTI:  0.24 m                            Systemic Diam: 2.10 cm Skeet Latch MD Electronically signed by Skeet Latch MD Signature Date/Time: 01/15/2020/3:50:03 PM    Final

## 2020-01-22 NOTE — TOC Progression Note (Signed)
Transition of Care [facility]) - Progression Note    Patient Details  Name: Sumiko Lanzer MRN: [id number] Date of Birth: August 11, 1956  Transition of Care [facility]) CM/SW [place], LCSW Phone Number: 01/22/2020, 4:02 PM  Clinical Narrative:    Per Miquel Dunn, patient's copays are [phone] for 14 days, required upon admission. CSW relayed info to patient's daughter. She will contact CSW back after speaking with her family.   Expected Discharge Plan: Skilled Nursing Facility Barriers to Discharge: SNF Pending bed offer, Insurance Authorization  Expected Discharge Plan and Services Expected Discharge Plan: [place] Choice: [place] arrangements for the past 2 months: Single Family Home                                       Social Determinants of Health (SDOH) Interventions    Readmission Risk Interventions Readmission Risk Prevention Plan 01/09/2020  Transportation Screening Complete  PCP or Specialist Appt within 3-5 Days Complete  HRI or [place] Complete  Social Work Consult for [place] Planning/Counseling Complete  Palliative Care Screening Not Applicable  Medication Review Press photographer) Complete  Some recent data might be hidden

## 2020-01-23 LAB — CBC
HCT: 38.9 % (ref 36.0–46.0)
Hemoglobin: 12.7 g/dL (ref 12.0–15.0)
MCH: 31.1 pg (ref 26.0–34.0)
MCHC: 32.6 g/dL (ref 30.0–36.0)
MCV: 95.1 fL (ref 80.0–100.0)
Platelets: 297 10*3/uL (ref 150–400)
RBC: 4.09 MIL/uL (ref 3.87–5.11)
RDW: 19.1 % — ABNORMAL HIGH (ref 11.5–15.5)
WBC: 39.5 10*3/uL — ABNORMAL HIGH (ref 4.0–10.5)
nRBC: 2.2 % — ABNORMAL HIGH (ref 0.0–0.2)

## 2020-01-23 LAB — COMPREHENSIVE METABOLIC PANEL
ALT: 36 U/L (ref 0–44)
AST: 69 U/L — ABNORMAL HIGH (ref 15–41)
Albumin: 2.6 g/dL — ABNORMAL LOW (ref 3.5–5.0)
Alkaline Phosphatase: 75 U/L (ref 38–126)
Anion gap: 16 — ABNORMAL HIGH (ref 5–15)
BUN: 72 mg/dL — ABNORMAL HIGH (ref 8–23)
CO2: 19 mmol/L — ABNORMAL LOW (ref 22–32)
Calcium: 8.1 mg/dL — ABNORMAL LOW (ref 8.9–10.3)
Chloride: 104 mmol/L (ref 98–111)
Creatinine, Ser: 1.84 mg/dL — ABNORMAL HIGH (ref 0.44–1.00)
GFR calc Af Amer: 33 mL/min — ABNORMAL LOW (ref >60)
GFR calc non Af Amer: 29 mL/min — ABNORMAL LOW (ref >60)
Glucose, Bld: 136 mg/dL — ABNORMAL HIGH (ref 70–99)
Potassium: 5 mmol/L (ref 3.5–5.1)
Sodium: 139 mmol/L (ref 135–145)
Total Bilirubin: 1.2 mg/dL (ref 0.3–1.2)
Total Protein: 5.5 g/dL — ABNORMAL LOW (ref 6.5–8.1)

## 2020-01-23 LAB — GLUCOSE, CAPILLARY
Glucose-Capillary: 51 mg/dL — ABNORMAL LOW (ref 70–99)
Glucose-Capillary: 71 mg/dL (ref 70–99)
Glucose-Capillary: 193 mg/dL — ABNORMAL HIGH (ref 70–99)
Glucose-Capillary: 259 mg/dL — ABNORMAL HIGH (ref 70–99)
Glucose-Capillary: 274 mg/dL — ABNORMAL HIGH (ref 70–99)

## 2020-01-23 MED ORDER — INSULIN ASPART 100 UNIT/ML ~~LOC~~ SOLN
0.0000 [IU] | Freq: Three times a day (TID) | SUBCUTANEOUS | Status: DC
  Administered 2020-01-23: 2 [IU] via SUBCUTANEOUS
  Administered 2020-01-23: 5 [IU] via SUBCUTANEOUS
  Administered 2020-01-24: 2 [IU] via SUBCUTANEOUS
  Administered 2020-01-24: 1 [IU] via SUBCUTANEOUS
  Administered 2020-01-25 (×2): 2 [IU] via SUBCUTANEOUS

## 2020-01-23 MED ORDER — INSULIN GLARGINE 100 UNIT/ML ~~LOC~~ SOLN
18.0000 [IU] | Freq: Two times a day (BID) | SUBCUTANEOUS | Status: DC
  Administered 2020-01-23 – 2020-01-25 (×5): 18 [IU] via SUBCUTANEOUS
  Filled 2020-01-23 (×6): qty 0.18

## 2020-01-23 NOTE — Progress Notes (Signed)
Hypoglycemic Event  CBG: 51  Treatment: 120cc juice Symptoms: N/A Follow-up [id number]: [id number] CBG Result:71  Possible Reasons for Event: Poor Intake Comments/MD notified:N/A    [name]  Kathlen [place]

## 2020-01-23 NOTE — Progress Notes (Signed)
Physical Therapy Treatment Patient Details Name: Greer Augusta MRN: [id number] DOB: 1955/10/18 Today's Date: 01/23/2020    History of Present Illness 64 year old female admitted 01/09/2020 with SOB and cough. Patient with acute hypoxic respiratory failure due to COVID PNA and decompensatd diastolic heart failure. Of note, patient with recent hospitalization 4/18-4/23/21 for stent to L distal SFA. This admission, steroids and Remdesivir started 01/13/20. Patient with elevated troponin likely secondary to underlying CKD, not consistent with ACS. PMH: hypertension, HLD, DM-2, CKD stage IV, PVD s/p stent to left distal SFA on [id number], chronic diastolic heart failure    PT Comments    Patient continues to require one to two person assist for mobility (pending task) with extended rest breaks in between tasks. With encouragement, patient able to ambulate a few feet with close chair follow and one assist using RW. Continued recommendation for SNF for short term rehabilitation.   Follow Up Recommendations  SNF;Supervision/Assistance - 24 hour     Equipment Recommendations  3in1 (PT);Rolling walker with 5" wheels;Wheelchair (measurements PT);Wheelchair cushion (measurements PT)       Precautions / Restrictions Precautions Precautions: Fall Precaution Comments: 234mL fluid restriction, elevate LEs Restrictions Weight Bearing Restrictions: No    Mobility  Bed Mobility Overal bed mobility: Needs Assistance Bed Mobility: Supine to Sit  Supine to sit: Mod assist;+2 for physical assistance;+2 for safety/equipment;HOB elevated   Transfers Overall transfer level: Needs assistance Equipment used: Rolling walker (2 wheeled) Transfers: Sit to/from [facility] Sit to Stand: Min assist;Mod assist;From elevated surface(1-2 assist) Stand pivot transfers: Min assist;Mod assist;+2 physical assistance;From elevated surface  General transfer comment: stand-step transfer EOB>chair with  min/modA x 2 and RW (HR up to 115 bpm with transfer), sit<>stand from recliner chair with modA x 1 and RW, LOB posteriorly requiring assistance and patient sitting back down on chair on trial 1  Ambulation/Gait Ambulation/Gait assistance: Min assist Gait Distance (Feet): 4 Feet Assistive device: Rolling walker (2 wheeled) Gait Pattern/deviations: Decreased step length - right;Decreased step length - left;Leaning posteriorly    Balance Overall balance assessment: Needs assistance Sitting-balance support: Feet supported Sitting balance-Leahy Scale: Fair Sitting balance - Comments: sitting EOB approx 5 minutes   Standing balance support: Bilateral upper extremity supported Standing balance-Leahy Scale: Poor Standing balance comment: posterior lean initially upon standing    Cognition Arousal/Alertness: Awake/alert;Lethargic Behavior During Therapy: (behavior varied) Overall Cognitive Status: No family/caregiver present to determine baseline cognitive functioning      General Comments General comments (skin integrity, edema, etc.): No pulse oximeter on upon PT entrance. Ear probe applied at end, uanble to get accurate oxygen saturation reading, nurse in room      Pertinent Vitals/Pain Pain Assessment: No/denies pain((no complaints of pain, no signs/symptoms of pain))           PT Goals (current goals can now be found in the care plan section) Progress towards PT goals: Progressing toward goals    Frequency    Min 2X/week      PT Plan Current plan remains appropriate       AM-PAC PT "6 Clicks" Mobility   Outcome Measure  Help needed turning from your back to your side while in a flat bed without using bedrails?: A Little Help needed moving from lying on your back to sitting on the side of a flat bed without using bedrails?: A Lot Help needed moving to and from a bed to a chair (including a wheelchair)?: A Lot Help needed standing up from a chair  using your arms (e.g.,  wheelchair or bedside chair)?: A Little Help needed to walk in hospital room?: A Lot Help needed climbing 3-5 steps with a railing? : A Lot 6 Click Score: 14    End of Session   Activity Tolerance: Patient limited by fatigue Patient left: in chair;with call bell/phone within reach;with chair alarm set Nurse Communication: Mobility status PT Visit Diagnosis: Other abnormalities of gait and mobility (R26.89);Muscle weakness (generalized) (M62.81)     Time: [id number] PT Time Calculation (min) (ACUTE ONLY): 41 min  Charges:  $Therapeutic Activity: 38-52 mins                     Birdie Hopes, PT, DPT Acute Rehab [phone] office     Birdie Hopes 01/23/2020, 2:22 PM

## 2020-01-23 NOTE — Progress Notes (Signed)
PROGRESS NOTE                                                                                                                                                                                                             Patient Demographics:    [name], is a 64 y.o. female, DOB - December 27, 1955, [id number]  Outpatient Primary MD for the patient is Pavelock, Ralene Bathe, MD   Admit date - 01/01/2020   LOS - 10  Chief Complaint  Patient presents with  . Shortness of Breath  . Chest Pain       Brief Narrative: Patient is a 64 y.o. female  PMHx of hypertension, HLD, DM-2, CKD stage IV, PVD s/p stent to left distal SFA on [id number], chronic diastolic heart failure-presented with shortness of breath or cough-found to have acute hypoxic respiratory failure secondary to COVID-19 pneumonia and decompensated diastolic heart failure.  Significant Events: 4/18-4/23>> admit to [facility]-s/p stent to left distal SFA 4/27>> admit to [facility] for hypoxia from Covid/heart failure 4/27>> lower extremity Dopplers negative for DVT. [id number] grade 2 diastolic dysfunction, moderate RV systolic dysfunction.  RVSP 62.6 mmHg. 4/30>> VQ scan-low probability PE  COVID-19 medications: Steroids: 4/27>> Remdesivir: 4/27>> 5/1  Antibiotics: Vancomycin: 4/27 x 1 Cefepime: 4/27 x 1  Microbiology data: 4/27: Blood culture>> no growth 4/28: Blood culture>> no growth  DVT prophylaxis: SQ heparin  Procedures: None  Consults: None    Subjective:   Remains on room air-worried about hypoglycemic episode that occurred this morning.  She refused her steroids yesterday.   Assessment  & Plan :   Acute Hypoxic Resp Failure due to Covid 19 Viral pneumonia and decompensated diastolic heart failure: Significantly improved-now on room air.  Continue supportive care-VQ scan without PE, lower extremity Dopplers negative.    COVID-19 pneumonia: Hypoxia has  resolved-refused steroids yesterday-claims that her body does not agree with prednisone.  Is s/p Actemra-has completed a course of remdesivir.  CRP has normalized.  Stop steroids as patient not willing to take it.  Watch/follow closely.  Fever: afebrile  O2 requirements:  SpO2: 100 % O2 Flow Rate (L/min): 3 L/min   COVID-19 Labs: Recent Labs    01/21/20 1115 01/22/20 0739  DDIMER  --  5.04*  CRP 5.7* 1.3*       Component Value Date/Time   BNP 134.9 (  H) 01/17/2020 0831    No results for input(s): PROCALCITON in the last 168 hours.  Lab Results  Component Value Date   [place] (A) 01/13/2020   [place] NEGATIVE 01/05/2020   [place] Not Detected 11/24/2019     Prone/Incentive Spirometry: encouraged  incentive spirometry use 3-4/hour.  Decompensated diastolic heart failure: Volume status better-was on IV diuretics for a few days-currently on Demadex.  Continue to monitor volume status/renal function.   Pulmonary hypertension/RV dysfunction: Not seen in prior echo's-I suspect this is probably from OSA-she has been noncompliant to CPAP, but given her worsening hypoxemia-she underwent a VQ scan-which was low probability for PE.  Lower extremity Dopplers negative.  Briefly on IV heparin-but have switched back to prophylactic dosing.   AKI on CKD stage IV: Improving and back to baseline-AKI likely hemodynamically mediated.  Renal ultrasound without any hydronephrosis.    Acute metabolic encephalopathy: Resolved-back to baseline.  Likely secondary to hypoxemia and AKI.  Continue supportive care.   Elevated troponin: Trend is flat-not consistent with ACS-likely secondary to underlying CKD-with some component from demand ischemia due to COVID-19/heart failure.  PAD-s/p stent to left SFA on 4/22: Both lower extremities appear warm-continue aspirin, Plavix and statin.  HTN: BP stable-continue Demadex-Cardura and Lasix remain on hold.   DM-2 with hyperglycemia and  hyperglycemia (A1c 12.2 on 4/19): Hypoglycemic episode this morning-suspect due to poor oral intake-but also from the fact that patient refused steroids yesterday.  Since refusing to be on steroids-suspect will have decreased insulin requirement-decrease Lantus to 18 units twice daily-change SSI to sensitive scale.  Follow and adjust.    Recent Labs    01/23/20 0821 01/23/20 0848 01/23/20 1211  GLUCAP 51* 71 193*    Thrombocytopenia: Resolved.  Leukocytosis: Secondary to steroids-no evidence of any superimposed bacterial infection.  Continue to follow.  Anemia of chronic disease: Close to baseline-likely related to CKD.  Gout: No flare-hold colchicine for now given AKI.  OSA: Noncompliant to CPAP-continue CPAP qhs if patient cooperates.  Obesity: Estimated body mass index is 44.24 kg/m as calculated from the following:   Height as of this encounter: 5\' 1"  (1.549 m).   Weight as of this encounter: 106.2 kg.   ABG:    Component Value Date/Time   PHART 7.460 (H) 09/30/2008 1120   PCO2ART 41.0 09/30/2008 1120   PO2ART 83.0 09/30/2008 1120   HCO3 26.4 12/06/2016 0922   HCO3 26.1 12/06/2016 0922   TCO2 28 12/06/2016 0922   TCO2 28 12/06/2016 0922   O2SAT 69.0 12/06/2016 0922   O2SAT 67.0 12/06/2016 0922    Vent Settings: N/A  Condition - Guarded  Family Communication  : Left a voicemail for daughter on 5/7  Code Status :  Full Code  Diet :  Diet Order            Diet heart healthy/carb modified Room service appropriate? Yes; Fluid consistency: Thin; Fluid restriction: 2000 mL Fluid  Diet effective now               Disposition Plan  :   Status is: Inpatient  Remains inpatient appropriate because:Inpatient level of care appropriate due to severity of illness  Dispo: The patient is from: Home              Anticipated d/c is to: Home              Anticipated d/c date is: > 3 days  Patient currently is medically stable to d/c.  Barriers to  discharge: awaiting SNF bed  Antimicorbials  :    Anti-infectives (From admission, onward)   Start     Dose/Rate Route Frequency Ordered Stop   01/15/20 1200  vancomycin (VANCOREADY) IVPB 1250 mg/250 mL  Status:  Discontinued     1,250 mg 166.7 mL/hr over 90 Minutes Intravenous Every 48 hours 01/13/20 1018 01/14/20 1038   01/14/20 1000  remdesivir 100 mg in sodium chloride 0.9 % 100 mL IVPB     100 mg 200 mL/hr over 30 Minutes Intravenous Daily 01/13/20 1129 01/17/20 1113   01/13/20 1630  valACYclovir (VALTREX) tablet 1,000 mg  Status:  Discontinued     1,000 mg Oral 3 times daily 01/13/20 1557 01/17/20 1147   01/13/20 1230  remdesivir 200 mg in sodium chloride 0.9% 250 mL IVPB     200 mg 580 mL/hr over 30 Minutes Intravenous Once 01/13/20 1129 01/13/20 1800   01/13/20 1015  ceFEPIme (MAXIPIME) 2 g in sodium chloride 0.9 % 100 mL IVPB     2 g 200 mL/hr over 30 Minutes Intravenous  Once 01/13/20 1003 01/13/20 1434   01/13/20 1015  vancomycin (VANCOREADY) IVPB 2000 mg/400 mL     2,000 mg 200 mL/hr over 120 Minutes Intravenous  Once 01/13/20 1012 01/13/20 2258      Inpatient Medications  Scheduled Meds: . vitamin C  500 mg Oral Daily  . aspirin EC  81 mg Oral Q breakfast  . atorvastatin  40 mg Oral Daily  . brimonidine  1 drop Both Eyes BID  . clopidogrel  75 mg Oral Daily  . dorzolamide-timolol  1 drop Both Eyes BID  . doxazosin  4 mg Oral QHS  . heparin injection (subcutaneous)  5,000 Units Subcutaneous Q8H  . insulin aspart  0-5 Units Subcutaneous QHS  . insulin aspart  0-9 Units Subcutaneous TID WC  . insulin glargine  18 Units Subcutaneous BID  . Ipratropium-Albuterol  1 puff Inhalation Q6H  . latanoprost  1 drop Both Eyes QHS  . linagliptin  5 mg Oral Daily  . pantoprazole  40 mg Oral Q1200  . prednisoLONE acetate  1 drop Right Eye BID  . predniSONE  40 mg Oral Q breakfast  . sodium bicarbonate  1,300 mg Oral BID  . sodium chloride flush  3 mL Intravenous Once  .  torsemide  40 mg Oral Daily  . zinc sulfate  220 mg Oral Daily   Continuous Infusions:  PRN Meds:.acetaminophen, chlorpheniramine-HYDROcodone, guaiFENesin-dextromethorphan, ondansetron **OR** ondansetron (ZOFRAN) IV, oxyCODONE-acetaminophen   Time Spent in minutes  25  See all Orders from today for further details   Oren Binet M.D on 01/23/2020 at 3:39 PM  To page go to [email or web address] - use universal password  [facility] -  Office  [phone]    Objective:   Vitals:   01/23/20 0000 01/23/20 0400 01/23/20 0500 01/23/20 1200  BP: (!) 134/30 (!) 129/52  (!) 130/55  Pulse: 78 88  88  Resp: 20 (!) 21  (!) 24  Temp: 97.6 F (36.4 C) 97.8 F (36.6 C)  97.8 F (36.6 C)  TempSrc: Oral Axillary  Oral  SpO2: 93% 100%  100%  Weight:   106.2 kg   Height:        Wt Readings from Last 3 Encounters:  01/23/20 106.2 kg  01/08/20 115.6 kg  07/15/19 102.9 kg     Intake/Output Summary (Last 24 hours) at 01/23/2020 1539 Last  data filed at 01/23/2020 0640 Gross per 24 hour  Intake 680 ml  Output 1300 ml  Net -620 ml    Physical Exam Gen Exam:Alert awake-not in any distress HEENT:atraumatic, normocephalic Chest: B/L clear to auscultation anteriorly CVS:S1S2 regular Abdomen:soft non tender, non distended Extremities:+ edema Neurology: Non focal Skin: no rash   Data Review:    CBC Recent Labs  Lab 01/17/20 0831 01/17/20 0831 01/18/20 [id number] 01/18/20 [id number] 01/19/20 0534 01/20/20 0547 01/21/20 1115 01/22/20 1042 01/23/20 0721  WBC 16.8*   < > 20.4*   < > 19.9* 21.4* 25.9* 34.2* 39.5*  HGB 10.8*   < > 11.3*   < > 11.6* 11.1* 11.6* 12.1 12.7  HCT 32.7*   < > 34.2*   < > 36.9 33.5* 37.3 36.2 38.9  PLT 323   < > 336   < > 361 355 334 315 297  MCV 90.1   < > 91.2   < > 95.3 91.5 97.4 91.6 95.1  MCH 29.8   < > 30.1   < > 30.0 30.3 30.3 30.6 31.1  MCHC 33.0   < > 33.0   < > 31.4 33.1 31.1 33.4 32.6  RDW 16.7*   < > 16.9*   < > 17.2* 16.7* 17.8* 18.1* 19.1*    LYMPHSABS 1.4  --  1.5  --   --   --   --   --   --   MONOABS 1.1*  --  1.3*  --   --   --   --   --   --   EOSABS 0.0  --  0.0  --   --   --   --   --   --   BASOSABS 0.0  --  0.0  --   --   --   --   --   --    < > = values in this interval not displayed.    Chemistries  Recent Labs  Lab 01/17/20 0831 01/17/20 0831 01/18/20 [id number] 01/18/20 0702 01/19/20 0534 01/20/20 0547 01/21/20 1115 01/22/20 1042 01/23/20 1012  NA 140   < > 138   < > 139 141 139 139 139  K 4.5   < > 4.8   < > 5.3* 5.3* 4.1 4.1 5.0  CL 109   < > 107   < > 109 107 103 103 104  CO2 19*   < > 18*   < > 18* 20* 24 22 19*  GLUCOSE 159*   < > 178*   < > 227* 155* 132* 121* 136*  BUN 94*   < > 101*   < > 100* 96* 27* 84* 72*  CREATININE 3.11*   < > 2.98*   < > 2.74* 2.34* 0.95 2.10* 1.84*  CALCIUM 8.6*   < > 8.7*   < > 8.6* 8.7* 8.0* 8.4* 8.1*  MG 2.4  --  2.4  --   --   --   --   --   --   AST 69*   < > 61*   < > 63* 59* 105* 51* 69*  ALT 43   < > 38   < > 41 36 57* 38 36  ALKPHOS 71   < > 73   < > 70 67 64 74 75  BILITOT 0.9   < > 0.8   < > 1.0 1.0 0.8 1.4* 1.2   < > = values in this interval not  displayed.   ------------------------------------------------------------------------------------------------------------------ No results for input(s): CHOL, HDL, LDLCALC, TRIG, CHOLHDL, LDLDIRECT in the last 72 hours.  Lab Results  Component Value Date   HGBA1C 12.2 (H) 01/05/2020   ------------------------------------------------------------------------------------------------------------------ No results for input(s): TSH, T4TOTAL, T3FREE, THYROIDAB in the last 72 hours.  Invalid input(s): FREET3 ------------------------------------------------------------------------------------------------------------------ No results for input(s): VITAMINB12, FOLATE, FERRITIN, TIBC, IRON, RETICCTPCT in the last 72 hours.  Coagulation profile No results for input(s): INR, PROTIME in the last 168 hours.  Recent Labs     01/22/20 0739  DDIMER 5.04*    Cardiac Enzymes No results for input(s): CKMB, TROPONINI, MYOGLOBIN in the last 168 hours.  Invalid input(s): CK ------------------------------------------------------------------------------------------------------------------    Component Value Date/Time   BNP 134.9 (H) 01/17/2020 0831    Micro Results Recent Results (from the past 240 hour(s))  Blood culture (routine x 2)     Status: None   Collection Time: 01/14/20  8:51 AM   Specimen: BLOOD LEFT HAND  Result Value Ref Range Status   Specimen Description BLOOD LEFT HAND  Final   Special Requests   Final    BOTTLES DRAWN AEROBIC AND ANAEROBIC Blood Culture results may not be optimal due to an inadequate volume of blood received in culture bottles   Culture   Final    NO GROWTH 5 DAYS Performed at [facility], [place] [address], [place], [place] [address]    Report Status 01/19/2020 FINAL  Final    Radiology Reports DG Chest 2 View  Result Date: 01/13/2020 CLINICAL DATA:  Shortness of breath and bilateral leg swelling. EXAM: CHEST - 2 VIEW COMPARISON:  November 30, 2016 FINDINGS: Moderate severity diffuse multifocal infiltrates are seen, bilaterally. There is no evidence of a pleural effusion or pneumothorax. The cardiac silhouette is mildly enlarged. A radiopaque fusion plate and screws are seen overlying the lower cervical spine. The visualized skeletal structures are otherwise unremarkable. Radiopaque surgical clips are seen within the right upper quadrant. IMPRESSION: Moderate severity bilateral multifocal infiltrates. Electronically Signed   By: Virgina Norfolk M.D.   On: 01/13/2020 00:27   NM Pulmonary Perfusion  Result Date: 01/16/2020 CLINICAL DATA:  Shortness of breath, history of COVID-19 positivity EXAM: NUCLEAR MEDICINE PERFUSION LUNG SCAN TECHNIQUE: Perfusion images were obtained in multiple projections after intravenous injection of radiopharmaceutical. Ventilation scans  intentionally deferred if perfusion scan and chest x-ray adequate for interpretation during COVID 19 epidemic. RADIOPHARMACEUTICALS:  1.6 mCi Tc-17m MAA IV COMPARISON:  Chest x-ray from earlier in the same day. FINDINGS: Perfusion images demonstrate adequate uptake throughout both lungs despite the changes seen on recent chest x-ray. No focal defect to suggest pulmonary embolism is noted. IMPRESSION: No evidence of pulmonary embolism. Electronically Signed   By: Inez Catalina M.D.   On: 01/16/2020 16:23   US RENAL  Result Date: 01/16/2020 CLINICAL DATA:  Acute renal insufficiency. EXAM: RENAL / URINARY TRACT ULTRASOUND COMPLETE COMPARISON:  None. FINDINGS: Exam difficult due to patient body habitus. Right Kidney: Renal measurements: 9.4 x 4.2 x 4.5 cm = volume: 94 mL . Echogenicity within normal limits. Mild cortical thinning. No mass or hydronephrosis visualized. Left Kidney: Renal measurements: 10.1 x 5.2 x 5.5 cm = volume: 150 mL. Echogenicity within normal limits. Mild cortical thinning. No mass or hydronephrosis visualized. Bladder: Not visualized. Other: None. IMPRESSION: Normal size kidneys without hydronephrosis. Electronically Signed   By: Marin Olp M.D.   On: 01/16/2020 16:38   US RENAL  Result Date: 01/06/2020 CLINICAL DATA:  Chronic kidney  disease EXAM: RENAL / URINARY TRACT ULTRASOUND COMPLETE COMPARISON:  None. FINDINGS: Right Kidney: Renal measurements: 9.3 x 4.3 x 4.6 cm = volume: 96 mL. Lobulated contour. No hydronephrosis. Normal echogenicity. Left Kidney: Renal measurements: 10.7 x 5.0 x 5.2 cm = volume: 145 mL. Lobulated contour without hydronephrosis. Normal echogenicity. Bladder: Decompressed. Other: Visualization is generally limited by body habitus. IMPRESSION: No hydronephrosis. Lobulated renal contours without focal parenchymal abnormality. Electronically Signed   By: Ulyses Jarred M.D.   On: 01/06/2020 16:16   PERIPHERAL VASCULAR CATHETERIZATION  Result Date:  01/08/2020 Patient name: Winnefred Ramson   MRN: [id number]        DOB: [birth year]            Sex: female  01/08/2020 Pre-operative Diagnosis: Critical limb ischemia of the left lower extremity with tissue loss Post-operative diagnosis:  Same Surgeon:  Marty Heck, MD Procedure Performed: 1.  Ultrasound guided access of the right common femoral artery 2.  Aortogram with CO2 including catheter selection of the aorta 3.  Left lower extremity arteriogram with selection of third order branches 4.  Left distal SFA above-knee popliteal artery angioplasty with stent placement (primarily stented with a 6 mm x 60 mm drug-coated Eluvia postdilated with a 5 mm Mustang) 5.  Left anterior tibial angioplasty for focal perforation with resolution of the perforation (3 mm Sterling) 6.  Mynx closure of the right common femoral artery 7.  55 minutes of monitored moderate conscious sedation time   Indications: Patient is a 64 year old female who was seen in consultation by Dr. Trula Slade for left lower extremity rest pain with severely depressed ABIs and duplex showing likely popliteal disease.  She presents today for left lower extremity arteriogram after risk benefits discussed.  Findings:  Aortogram was performed with CO2 to limit contrast and there was no flow-limiting aortoiliac stenosis.  Left lower extremity arteriogram showed a patent common femoral profunda and widely patent SFA in the proximal to mid segment.  At [facility] canal in the distal SFA and above-knee popliteal artery showed a very short focal occlusion with large collateral around this.  She had a patent below-knee popliteal artery with three-vessel runoff.  Anterior tibial appeared to be dominant runoff.   Ultimately this lesion was crossed in the distal SFA/above knee popliteal artery and primarily stented with a 6 mm x 60 mm Eluvia.  She now has excellent inline flow down the left lower extremity with no residual stenosis and three-vessel runoff.  There  was a focal perforation in the anterior tibial where our wire likely made a perforation during exchange of balloons and stents.  Ultimately this was treated with a very low inflation of a 3 mm Sterling for 3 minutes with resolution.  Patient has preserved three vessel runoff.              Procedure:  The patient was identified in the holding area and taken to room 8.  The patient was then placed supine on the table and prepped and draped in the usual sterile fashion.  A time out was called.  Ultrasound was used to evaluate the right common femoral artery.  It was patent .  A digital ultrasound image was acquired.  A micropuncture needle was used to access the right common femoral artery under ultrasound guidance.  An 018 wire was advanced without resistance and a micropuncture sheath was placed.  The 018 wire was removed and a benson wire was placed.  The micropuncture sheath was exchanged for a  5 french sheath.  An omniflush catheter was advanced over the wire to the level of L-1.  An abdominal angiogram was obtained.  Next, using glidewire and straight 4 French flush catheter the aortic bifurcation was cross because the Omni would not track.  Placed the catheter into theleft external iliac artery and left runoff was obtained.  Ultimately after evaluating the pictures elected to intervene on the distal left SFA above-knee popliteal occlusion.  Used a long Glidewire advantage down the SFA and exchanged for a 6 [place] Ansell sheath in the right groin over the aortic bifurcation.  Patient was given 100 units per kilogram heparin.  I then primarily stented this lesion with a 6 mm x 60 mm Eluvia that was postdilated with a 5 mm x 60 mm Mustang.  Final pictures showed no residual stenosis with excellent inline flow down the left lower extremity and three-vessel runoff.  There was a focal perforation in the proximal anterior tibial that I think was from our wire.  I then exchanged for a V 18 wire down the anterior tibial  and then did a very slow inflation with a 3 mm Sterling in the left anterior tibial artery for 3 minutes.  There appeared to be resolution of the perforation after taking multiple additional pictures.  Wires and catheters removed and a short 6 French sheath was placed in the right groin.  We placed a mynx closure device.   Plan: Patient will be loaded on Plavix and will need to continue aspirin and Plavix.  Should have inline flow down left leg now.   Marty Heck, MD [facility] Office: [phone]   DG Chest Port 1V same Day  Result Date: 01/16/2020 CLINICAL DATA:  Shortness of breath.  COVID. EXAM: PORTABLE CHEST 1 VIEW COMPARISON:  Three days ago FINDINGS: More confluent bilateral interstitial and airspace opacity. Cardiomegaly. No visible effusion or pneumothorax. IMPRESSION: Worsening bilateral pneumonia. Electronically Signed   By: Monte Fantasia M.D.   On: 01/16/2020 08:34   DG Foot Complete Left  Result Date: 01/05/2020 CLINICAL DATA:  Swelling EXAM: LEFT FOOT - COMPLETE 3+ VIEW COMPARISON:  None. FINDINGS: Degenerative changes in the 1st MTP joint. No acute bony abnormality. Specifically, no fracture, subluxation, or dislocation. Vascular calcifications noted. No bone destruction seen to suggest osteomyelitis. IMPRESSION: No acute bony abnormality. Electronically Signed   By: Rolm Baptise M.D.   On: 01/05/2020 01:25   VAS [place] ABI WITH/WO TBI  Result Date: 01/05/2020 LOWER EXTREMITY DOPPLER STUDY Indications: Blue, cold toes. High Risk Factors: Hypertension, Diabetes.  Limitations: Today's exam was limited due to Patient body habitus. Comparison Study: No prior studies. Performing Technologist: Carlos Levering Rvt  Examination Guidelines: A complete evaluation includes at minimum, Doppler waveform signals and systolic blood pressure reading at the level of bilateral brachial, anterior tibial, and posterior tibial arteries, when vessel segments are  accessible. Bilateral testing is considered an integral part of a complete examination. Photoelectric Plethysmograph (PPG) waveforms and toe systolic pressure readings are included as required and additional duplex testing as needed. Limited examinations for reoccurring indications may be performed as noted.  ABI Findings: +---------+------------------+-----+---------+--------+ Right    Rt Pressure (mmHg)IndexWaveform Comment  +---------+------------------+-----+---------+--------+ Brachial 150                    triphasic         +---------+------------------+-----+---------+--------+ PTA      108  0.72 biphasic          +---------+------------------+-----+---------+--------+ DP       137               0.91 triphasic         +---------+------------------+-----+---------+--------+ Great Toe76                0.51                   +---------+------------------+-----+---------+--------+ +---------+------------------+-----+----------+-------+ Left     Lt Pressure (mmHg)IndexWaveform  Comment +---------+------------------+-----+----------+-------+ Brachial 136                    triphasic         +---------+------------------+-----+----------+-------+ PTA      66                0.44 monophasic        +---------+------------------+-----+----------+-------+ DP       53                0.35 monophasic        +---------+------------------+-----+----------+-------+ Great Toe0                 0.00                   +---------+------------------+-----+----------+-------+ +-------+-----------+-----------+------------+------------+ ABI/TBIToday's ABIToday's TBIPrevious ABIPrevious TBI +-------+-----------+-----------+------------+------------+ Right  0.91       0.51                                +-------+-----------+-----------+------------+------------+ Left   0.44       0                                    +-------+-----------+-----------+------------+------------+  Summary: Right: Resting right ankle-brachial index indicates mild right lower extremity arterial disease. The right toe-brachial index is abnormal. Left: Resting left ankle-brachial index indicates severe left lower extremity arterial disease. The left toe-brachial index is abnormal.  *See table(s) above for measurements and observations.  Electronically signed by [name] MD on 01/05/2020 at 7:35:40 PM.    Final    VAS [place] LOWER EXTREMITY ARTERIAL DUPLEX  Result Date: 01/05/2020 LOWER EXTREMITY ARTERIAL DUPLEX STUDY Indications: Blue, cold toes. High Risk Factors: Hypertension, Diabetes.  Current ABI: R 0.91 L 0.44 Limitations: Patient body habitus, poor ultrasound/tissue interface. Comparison Study: No prior studies. Performing Technologist: Oliver Hum RVT  Examination Guidelines: A complete evaluation includes B-mode imaging, spectral Doppler, color Doppler, and power Doppler as needed of all accessible portions of each vessel. Bilateral testing is considered an integral part of a complete examination. Limited examinations for reoccurring indications may be performed as noted.  +-----------+--------+-----+--------+----------+------------------+ LEFT       PSV cm/sRatioStenosisWaveform  Comments           +-----------+--------+-----+--------+----------+------------------+ EIA Distal 116                  biphasic                     +-----------+--------+-----+--------+----------+------------------+ CFA Mid    132                  biphasic                     +-----------+--------+-----+--------+----------+------------------+ DFA  111                  biphasic                     +-----------+--------+-----+--------+----------+------------------+ SFA Prox   104                  biphasic                     +-----------+--------+-----+--------+----------+------------------+ SFA Mid    85                    biphasic                     +-----------+--------+-----+--------+----------+------------------+ SFA Distal 91                   biphasic                     +-----------+--------+-----+--------+----------+------------------+ POP Prox   26                   monophasic                   +-----------+--------+-----+--------+----------+------------------+ POP Distal 0                              Unable to insonate +-----------+--------+-----+--------+----------+------------------+ ATA Prox   73                   biphasic                     +-----------+--------+-----+--------+----------+------------------+ ATA Mid    33                   monophasic                   +-----------+--------+-----+--------+----------+------------------+ ATA Distal 32                   monophasic                   +-----------+--------+-----+--------+----------+------------------+ PTA Prox                                  Unable to insonate +-----------+--------+-----+--------+----------+------------------+ PTA Mid    27                   monophasic                   +-----------+--------+-----+--------+----------+------------------+ PTA Distal 9                    monophasic                   +-----------+--------+-----+--------+----------+------------------+ PERO Prox                                 Unable to insonate +-----------+--------+-----+--------+----------+------------------+ PERO Mid                                  Unable to insonate +-----------+--------+-----+--------+----------+------------------+ PERO Distal15  monophasic                   +-----------+--------+-----+--------+----------+------------------+ DP         29                   monophasic                   +-----------+--------+-----+--------+----------+------------------+  Summary: Left: The external iliac, common femoral, and superficial femoral  arteries appear patent with no significant stenosis. Monophasic waveforms are noted in the proximal popliteal artery with poor visualization of the distal segment. Which suggests a possible occlusion. Anterior tibial artery appears patent throughout with monophasic flow. The posterior tibial, and peroneal arteries demonstrated monophasic flow with likely areas of occlusive disease. The proximal calf arteries were difficult to visualize.  See table(s) above for measurements and observations. Electronically signed by [name] MD on 01/05/2020 at 64:34:41 PM.    Final    VAS [place] LOWER EXTREMITY VENOUS (DVT)  Result Date: 01/13/2020  Lower Venous DVTStudy Indications: Swelling.  Limitations: Body habitus and poor ultrasound/tissue interface. Performing Technologist: Antonieta Pert RDMS, RVT  Examination Guidelines: A complete evaluation includes B-mode imaging, spectral Doppler, color Doppler, and power Doppler as needed of all accessible portions of each vessel. Bilateral testing is considered an integral part of a complete examination. Limited examinations for reoccurring indications may be performed as noted. The reflux portion of the exam is performed with the patient in reverse Trendelenburg.  +---------+---------------+---------+-----------+----------+-------------------+ RIGHT    CompressibilityPhasicitySpontaneityPropertiesThrombus Aging      +---------+---------------+---------+-----------+----------+-------------------+ CFV      Partial        Yes      Yes                  patient body                                                              habitus and                                                               position limiting                                                         evaluation          +---------+---------------+---------+-----------+----------+-------------------+ SFJ      Full                                                              +---------+---------------+---------+-----------+----------+-------------------+ FV Prox  Yes                                      +---------+---------------+---------+-----------+----------+-------------------+ FV Mid                           Yes                                      +---------+---------------+---------+-----------+----------+-------------------+ FV Distal                        Yes                                      +---------+---------------+---------+-----------+----------+-------------------+ PFV      Full                                                             +---------+---------------+---------+-----------+----------+-------------------+ POP      Full                                                             +---------+---------------+---------+-----------+----------+-------------------+ PTV                                                   not clearly                                                               visualized          +---------+---------------+---------+-----------+----------+-------------------+ PERO                                                  not clearly                                                               visualized          +---------+---------------+---------+-----------+----------+-------------------+   +---------+---------------+---------+-----------+----------+------------------+ LEFT     CompressibilityPhasicitySpontaneityPropertiesThrombus Aging     +---------+---------------+---------+-----------+----------+------------------+ CFV      Full           Yes      Yes                                     +---------+---------------+---------+-----------+----------+------------------+  SFJ      Full                                                            +---------+---------------+---------+-----------+----------+------------------+ FV Prox   Full                                                            +---------+---------------+---------+-----------+----------+------------------+ FV Mid   Full                                                            +---------+---------------+---------+-----------+----------+------------------+ FV Distal                        Yes                                     +---------+---------------+---------+-----------+----------+------------------+ PFV      Full                                                            +---------+---------------+---------+-----------+----------+------------------+ POP      Full           Yes      Yes                                     +---------+---------------+---------+-----------+----------+------------------+ PTV                                                   not clearly                                                              visualized         +---------+---------------+---------+-----------+----------+------------------+ PERO                                                  not clearly  visualized         +---------+---------------+---------+-----------+----------+------------------+    Summary: RIGHT: - There is no evidence of deep vein thrombosis in the lower extremity. However, portions of this examination were limited- see technologist comments above.  LEFT: - There is no evidence of deep vein thrombosis in the lower extremity. However, portions of this examination were limited- see technologist comments above.  - Prominent lymph nodes noted bilateral groin areas.  *See table(s) above for measurements and observations. Electronically signed by Deitra Mayo MD on 01/13/2020 at 6:50:27 PM.    Final    ECHOCARDIOGRAM LIMITED  Result Date: 01/15/2020    ECHOCARDIOGRAM LIMITED REPORT   Patient Name:   MADLENE HULSLANDER Date of Exam: 01/15/2020  Medical Rec #:  [id number]     Height:       61.0 in Accession #:    [id number]    Weight:       249.3 lb Date of Birth:  10-05-1955      BSA:          2.074 m Patient Age:    74 years      BP:           131/70 mmHg Patient Gender: F             HR:           53 bpm. Exam Location:  Inpatient Procedure: Limited Echo, Limited Color Doppler and Cardiac Doppler Indications:    Acute Respiratory Insufficiency R06.89  History:        Patient has prior history of Echocardiogram examinations, most                 recent 12/02/2016. Risk Factors:Hypertension and Dyslipidemia.  Sonographer:    Mikki Santee RDCS (AE) Referring Phys: [place]  1. Left ventricular diastolic parameters are consistent with Grade II diastolic dysfunction (pseudonormalization).  2. Right ventricular systolic function is moderately reduced. There is severely elevated pulmonary artery systolic pressure.  3. The inferior vena cava is normal in size with greater than 50% respiratory variability, suggesting right atrial pressure of 3 mmHg. FINDINGS  Left Ventricle: Indeterminate filling pressures. Right Ventricle: Right ventricular systolic function is moderately reduced. There is severely elevated pulmonary artery systolic pressure. The tricuspid regurgitant velocity is 3.86 m/s, and with an assumed right atrial pressure of 3 mmHg, the estimated right ventricular systolic pressure is [id number]. Tricuspid Valve: Tricuspid valve regurgitation is mild. Venous: The inferior vena cava is normal in size with greater than 50% respiratory variability, suggesting right atrial pressure of 3 mmHg.  LEFT VENTRICLE PLAX 2D LVOT diam:     2.10 cm  Diastology LV SV:         81       LV e' lateral:   5.61 cm/s LV SV Index:   39       LV E/e' lateral: 12.1 LVOT Area:     3.46 cm LV e' medial:    6.40 cm/s                         LV E/e' medial:  10.6  RIGHT VENTRICLE TAPSE (M-mode): 1.5 cm LEFT ATRIUM           Index       RIGHT ATRIUM            Index LA Vol (A4C): 37.4 ml 18.03 ml/m RA Area:     16.80 cm  RA Volume:   44.50 ml  21.45 ml/m  AORTIC VALVE LVOT Vmax:   98.60 cm/s LVOT Vmean:  69.800 cm/s LVOT VTI:    0.235 m MITRAL VALVE               TRICUSPID VALVE MV Area (PHT): 3.53 cm    TR Peak grad:   59.6 mmHg MV Decel Time: 215 msec    TR Vmax:        386.00 cm/s MV E velocity: 67.70 cm/s MV A velocity: 65.10 cm/s  SHUNTS MV E/A ratio:  1.04        Systemic VTI:  0.24 m                            Systemic Diam: 2.10 cm Skeet Latch MD Electronically signed by Skeet Latch MD Signature Date/Time: 01/15/2020/3:50:03 PM    Final

## 2020-01-23 NOTE — TOC Progression Note (Signed)
Transition of Care [facility]) - Progression Note    Patient Details  Name: [name] MRN: [id number] Date of Birth: 01/18/1956  [facility] CM/SW [place], LCSW Phone Number: 01/23/2020, 8:47 AM  Clinical Narrative:    CSW spoke with patient's daughter to check the status of the SNF payment ability. She reports she had trouble getting her mother to answer the phone last night and could not move forward with the financial piece of the SNF. She states she will contact the person who will be paying for the SNF on her lunch break at 12pm. CSW explained that we need to know as soon as possible so that the patient does not incur any extra hospital bills for remaining in the hospital after she is ready for discharge. She reported understanding and stated that patient also has Medicaid, which will pay the hospital stay. CSW requested [facility] liaison continue to proceed with insurance approval so everything will be ready once patient's daughter is able to arrange payment.    Expected Discharge Plan: Skilled Nursing Facility Barriers to Discharge: Insurance Authorization  Expected Discharge Plan and Services Expected Discharge Plan: [place] Choice: [place] arrangements for the past 2 months: Single Family Home                                       Social Determinants of Health (SDOH) Interventions    Readmission Risk Interventions Readmission Risk Prevention Plan 01/09/2020  Transportation Screening Complete  PCP or Specialist Appt within 3-5 Days Complete  HRI or [place] Complete  Social Work Consult for [place] Planning/Counseling Complete  Palliative Care Screening Not Applicable  Medication Review Press photographer) Complete  Some recent data might be hidden

## 2020-01-24 ENCOUNTER — Inpatient Hospital Stay (HOSPITAL_COMMUNITY): Payer: BC Managed Care – PPO

## 2020-01-24 LAB — CBC WITH DIFFERENTIAL/PLATELET
Abs Immature Granulocytes: 1.91 10*3/uL — ABNORMAL HIGH (ref 0.00–0.07)
Basophils Absolute: 0.1 10*3/uL (ref 0.0–0.1)
Basophils Relative: 0 %
Eosinophils Absolute: 0.2 10*3/uL (ref 0.0–0.5)
Eosinophils Relative: 1 %
HCT: 33.3 % — ABNORMAL LOW (ref 36.0–46.0)
Hemoglobin: 10.7 g/dL — ABNORMAL LOW (ref 12.0–15.0)
Immature Granulocytes: 5 %
Lymphocytes Relative: 4 %
Lymphs Abs: 1.7 10*3/uL (ref 0.7–4.0)
MCH: 30.7 pg (ref 26.0–34.0)
MCHC: 32.1 g/dL (ref 30.0–36.0)
MCV: 95.7 fL (ref 80.0–100.0)
Monocytes Absolute: 2 10*3/uL — ABNORMAL HIGH (ref 0.1–1.0)
Monocytes Relative: 5 %
Neutro Abs: 32.6 10*3/uL — ABNORMAL HIGH (ref 1.7–7.7)
Neutrophils Relative %: 85 %
Platelets: 244 10*3/uL (ref 150–400)
RBC: 3.48 MIL/uL — ABNORMAL LOW (ref 3.87–5.11)
RDW: 18.9 % — ABNORMAL HIGH (ref 11.5–15.5)
WBC: 38.5 10*3/uL — ABNORMAL HIGH (ref 4.0–10.5)
nRBC: 2.7 % — ABNORMAL HIGH (ref 0.0–0.2)

## 2020-01-24 LAB — BASIC METABOLIC PANEL
Anion gap: 14 (ref 5–15)
BUN: 70 mg/dL — ABNORMAL HIGH (ref 8–23)
CO2: 24 mmol/L (ref 22–32)
Calcium: 8.4 mg/dL — ABNORMAL LOW (ref 8.9–10.3)
Chloride: 102 mmol/L (ref 98–111)
Creatinine, Ser: 1.98 mg/dL — ABNORMAL HIGH (ref 0.44–1.00)
GFR calc Af Amer: 30 mL/min — ABNORMAL LOW (ref >60)
GFR calc non Af Amer: 26 mL/min — ABNORMAL LOW (ref >60)
Glucose, Bld: 71 mg/dL (ref 70–99)
Potassium: 4.2 mmol/L (ref 3.5–5.1)
Sodium: 140 mmol/L (ref 135–145)

## 2020-01-24 LAB — PROCALCITONIN: Procalcitonin: 0.21 ng/mL

## 2020-01-24 LAB — D-DIMER, QUANTITATIVE: D-Dimer, Quant: 5.22 ug/mL-FEU — ABNORMAL HIGH (ref 0.00–0.50)

## 2020-01-24 LAB — GLUCOSE, CAPILLARY
Glucose-Capillary: 103 mg/dL — ABNORMAL HIGH (ref 70–99)
Glucose-Capillary: 123 mg/dL — ABNORMAL HIGH (ref 70–99)
Glucose-Capillary: 191 mg/dL — ABNORMAL HIGH (ref 70–99)
Glucose-Capillary: 193 mg/dL — ABNORMAL HIGH (ref 70–99)

## 2020-01-24 LAB — BRAIN NATRIURETIC PEPTIDE: B Natriuretic Peptide: 130.9 pg/mL — ABNORMAL HIGH (ref 0.0–100.0)

## 2020-01-24 LAB — C-REACTIVE PROTEIN: CRP: 0.7 mg/dL (ref <1.0)

## 2020-01-24 MED ORDER — FUROSEMIDE 10 MG/ML IJ SOLN
80.0000 mg | Freq: Two times a day (BID) | INTRAMUSCULAR | Status: AC
  Administered 2020-01-24 (×2): 80 mg via INTRAVENOUS
  Filled 2020-01-24 (×2): qty 8

## 2020-01-24 NOTE — TOC Progression Note (Signed)
Transition of Care [facility]) - Progression Note    Patient Details  Name: [name] MRN: [id number] Date of Birth: [birth year]  [facility]) CM/SW [place], LCSW Phone Number: 01/24/2020, 8:33 AM  Clinical Narrative:    [facility] notified CSW that they have insurance approval, however, are waiting on patient's daughter to pay copayment prior to patient's arrival. CSW spoke with patient's daughter to confirm plan. She reports she has to make phone calls to secure the rest of the payment and will contact CSW back. CSW instructed her that cash/check would need to be dropped off at the facility today and a debit card would need to be called in to [place] at [place]. She reported understanding.    Expected Discharge Plan: Skilled Nursing Facility Barriers to Discharge: Insurance Authorization  Expected Discharge Plan and Services Expected Discharge Plan: [place] Choice: [place] arrangements for the past 2 months: Single Family Home                                       Social Determinants of Health (SDOH) Interventions    Readmission Risk Interventions Readmission Risk Prevention Plan 01/09/2020  Transportation Screening Complete  PCP or Specialist Appt within 3-5 Days Complete  HRI or [place] Complete  Social Work Consult for [place] Planning/Counseling Complete  Palliative Care Screening Not Applicable  Medication Review Press photographer) Complete  Some recent data might be hidden

## 2020-01-24 NOTE — Progress Notes (Addendum)
PROGRESS NOTE                                                                                                                                                                                                             Patient Demographics:    [name], is a 64 y.o. female, DOB - 10-05-1955, [id number]  Outpatient Primary MD for the patient is Pavelock, Ralene Bathe, MD   Admit date - 12/26/2019   LOS - 40  Chief Complaint  Patient presents with  . Shortness of Breath  . Chest Pain       Brief Narrative: Patient is a 64 y.o. female  PMHx of hypertension, HLD, DM-2, CKD stage IV, PVD s/p stent to left distal SFA on [id number], chronic diastolic heart failure-presented with shortness of breath or cough-found to have acute hypoxic respiratory failure secondary to COVID-19 pneumonia and decompensated diastolic heart failure.  Significant Events: 4/18-4/23>> admit to [facility]-s/p stent to left distal SFA 4/27>> admit to [facility] for hypoxia from Covid/heart failure 4/27>> lower extremity Dopplers negative for DVT. [id number] grade 2 diastolic dysfunction, moderate RV systolic dysfunction.  RVSP 62.6 mmHg. 4/30>> VQ scan-low probability PE 5/5>> improved-on room air 5/8>> worsening hypoxemia-on 15 L of oxygen-with dramatic response to Lasix-down to 4 L  COVID-19 medications: Steroids: 4/27>>5/8 (refused on 5/6) Remdesivir: 4/27>> 5/1  Antibiotics: Vancomycin: 4/27 x 1 Cefepime: 4/27 x 1  Microbiology data: 4/27: Blood culture>> no growth 4/28: Blood culture>> no growth  DVT prophylaxis: SQ heparin  Procedures: None  Consults: Renal    Subjective:   Was on room air yesterday-developed some shortness of breath and worsening hypoxemia overnight-was on 15 L of oxygen this morning.  More lower extremity edema-weight also increased-given IV Lasix with significant diuresis-down to 4 L of O2 this afternoon.   Assessment  &  Plan :   Acute Hypoxic Resp Failure due to Covid 19 Viral pneumonia and decompensated diastolic heart failure: Had improved and was on room air for the past several days-developed pulmonary edema overnight-started IV Lasix again this morning-significant diabetic-down to 4 L of oxygen this afternoon.   Continue supportive care-VQ scan without PE, lower extremity Dopplers negative.    COVID-19 pneumonia: Hypoxia has resolved-initially on IV steroids-transition to prednisone which she has refused intermittently as she claims that her body does not agree with prednisone.  Worsening hypoxemia overnight is likely from decompensated heart failure-CRP has now normalized.  Has completed a course of steroids/remdesivir-and is s/p Actemra.  Prone/Incentive Spirometry: encouraged  incentive spirometry use 3-4/hour. Fever: afebrile O2 requirements:  SpO2: (!) 86 % O2 Flow Rate (L/min): 5 L/min   COVID-19 Labs: Recent Labs    01/22/20 0739 01/24/20 0812  DDIMER 5.04* 5.22*  CRP 1.3* 0.7       Component Value Date/Time   BNP 130.9 (H) 01/24/2020 0812    Recent Labs  Lab 01/24/20 0812  PROCALCITON 0.21    Lab Results  Component Value Date   SARSCOV2NAA POSITIVE (A) 01/13/2020   [place] NEGATIVE 01/05/2020   [place] Not Detected 11/24/2019     Decompensated diastolic heart failure: Had improved with IV diuretics-with stable volume status-was on Demadex 40 mg daily for the past few days-overnight-has developed worsening hypoxemia (back on 15 L)-more lower extremity edema-her weight has also started to creep up.  Given 80 mg of IV Lasix this morning-by this afternoon significant diuresis-improvement in hypoxemia-she is down to 4 L.  Repeat 80 mg of IV Lasix this evening-suspect that she will probably need to be transition back to her usual regimen of 60 mg of Demadex maintenance dose.Continue to monitor volume status/renal function.  Reassess on 5/9-for repeat IV Lasix vs Demadex  Filed  Weights   01/22/20 0447 01/23/20 0500 01/24/20 0432  Weight: 102.9 kg 106.2 kg 109.8 kg    Pulmonary hypertension/RV dysfunction: Not seen in prior echo's-I suspect this is probably from OSA-she has been noncompliant to CPAP, but given her worsening hypoxemia-she underwent a VQ scan-which was low probability for PE.  Lower extremity Dopplers negative.  Briefly on IV heparin-but have switched back to prophylactic dosing.   AKI on CKD stage IV: Improving and back to baseline-AKI likely hemodynamically mediated.  Renal ultrasound without any hydronephrosis.    Acute metabolic encephalopathy: Resolved-back to baseline.  Likely secondary to hypoxemia and AKI.  Continue supportive care.   Elevated troponin: Trend is flat-not consistent with ACS-likely secondary to underlying CKD-with some component from demand ischemia due to COVID-19/heart failure.  PAD-s/p stent to left SFA on 4/22: Both lower extremities appear warm-continue aspirin, Plavix and statin.  HTN: BP stable-  DM-2 with hyperglycemia and hyperglycemia (A1c 12.2 on 4/19): No further hypoglycemic episodes-CBGs reasonable-continue Lantus 18 units twice daily, SSI sensitive scale.  Follow and adjust.    Recent Labs    01/23/20 2201 01/24/20 0726 01/24/20 1308  GLUCAP 274* 103* 123*    Thrombocytopenia: Resolved.  Leukocytosis: Secondary to steroids-no evidence of any superimposed bacterial infection.  Repeat procalcitonin this morning negative.  Continue to follow.  Anemia of chronic disease: Close to baseline-likely related to CKD.  Gout: No flare-hold colchicine for now given AKI.  OSA: Noncompliant to CPAP-continue CPAP qhs if patient cooperates.  Obesity: Estimated body mass index is 45.74 kg/m as calculated from the following:   Height as of this encounter: 5\' 1"  (1.549 m).   Weight as of this encounter: 109.8 kg.   ABG:    Component Value Date/Time   PHART 7.460 (H) 09/30/2008 1120   PCO2ART 41.0 09/30/2008  1120   PO2ART 83.0 09/30/2008 1120   HCO3 26.4 12/06/2016 0922   HCO3 26.1 12/06/2016 0922   TCO2 28 12/06/2016 0922   TCO2 28 12/06/2016 0922   O2SAT 69.0 12/06/2016 0922   O2SAT 67.0 12/06/2016 0922    Vent Settings: N/A  Condition - Guarded  Family Communication  :  Left a voicemail for daughter on 5/7,5/8  Code Status :  Full Code  Diet :  Diet Order            Diet heart healthy/carb modified Room service appropriate? Yes; Fluid consistency: Thin; Fluid restriction: 2000 mL Fluid  Diet effective now               Disposition Plan  :   Status is: Inpatient  Remains inpatient appropriate because:Inpatient level of care appropriate due to severity of illness  Dispo: The patient is from: Home              Anticipated d/c is to: Home              Anticipated d/c date is: >2 days              Patient currently is NOT medically stable to d/c.  Barriers to discharge: Worsening hypoxemia secondary to decompensated heart failure  Antimicorbials  :    Anti-infectives (From admission, onward)   Start     Dose/Rate Route Frequency Ordered Stop   01/15/20 1200  vancomycin (VANCOREADY) IVPB 1250 mg/250 mL  Status:  Discontinued     1,250 mg 166.7 mL/hr over 90 Minutes Intravenous Every 48 hours 01/13/20 1018 01/14/20 1038   01/14/20 1000  remdesivir 100 mg in sodium chloride 0.9 % 100 mL IVPB     100 mg 200 mL/hr over 30 Minutes Intravenous Daily 01/13/20 1129 01/17/20 1113   01/13/20 1630  valACYclovir (VALTREX) tablet 1,000 mg  Status:  Discontinued     1,000 mg Oral 3 times daily 01/13/20 1557 01/17/20 1147   01/13/20 1230  remdesivir 200 mg in sodium chloride 0.9% 250 mL IVPB     200 mg 580 mL/hr over 30 Minutes Intravenous Once 01/13/20 1129 01/13/20 1800   01/13/20 1015  ceFEPIme (MAXIPIME) 2 g in sodium chloride 0.9 % 100 mL IVPB     2 g 200 mL/hr over 30 Minutes Intravenous  Once 01/13/20 1003 01/13/20 1434   01/13/20 1015  vancomycin (VANCOREADY) IVPB 2000  mg/400 mL     2,000 mg 200 mL/hr over 120 Minutes Intravenous  Once 01/13/20 1012 01/13/20 2258      Inpatient Medications  Scheduled Meds: . vitamin C  500 mg Oral Daily  . aspirin EC  81 mg Oral Q breakfast  . atorvastatin  40 mg Oral Daily  . brimonidine  1 drop Both Eyes BID  . clopidogrel  75 mg Oral Daily  . dorzolamide-timolol  1 drop Both Eyes BID  . doxazosin  4 mg Oral QHS  . furosemide  80 mg Intravenous BID  . heparin injection (subcutaneous)  5,000 Units Subcutaneous Q8H  . insulin aspart  0-5 Units Subcutaneous QHS  . insulin aspart  0-9 Units Subcutaneous TID WC  . insulin glargine  18 Units Subcutaneous BID  . Ipratropium-Albuterol  1 puff Inhalation Q6H  . latanoprost  1 drop Both Eyes QHS  . linagliptin  5 mg Oral Daily  . pantoprazole  40 mg Oral Q1200  . prednisoLONE acetate  1 drop Right Eye BID  . predniSONE  40 mg Oral Q breakfast  . sodium bicarbonate  1,300 mg Oral BID  . sodium chloride flush  3 mL Intravenous Once  . zinc sulfate  220 mg Oral Daily   Continuous Infusions:  PRN Meds:.acetaminophen, chlorpheniramine-HYDROcodone, guaiFENesin-dextromethorphan, ondansetron **OR** ondansetron (ZOFRAN) IV, oxyCODONE-acetaminophen   Time Spent in minutes  25  See  all Orders from today for further details   Oren Binet M.D on 01/24/2020 at 3:34 PM  To page go to [email or web address] - use universal password  [facility] -  Office  [phone]    Objective:   Vitals:   01/24/20 0430 01/24/20 0432 01/24/20 0730 01/24/20 1138  BP: (!) 108/93  (!) 112/54 (!) 116/50  Pulse: 60  69 67  Resp: 14  20 18   Temp: 97.7 F (36.5 C)  97.7 F (36.5 C) 98.6 F (37 C)  TempSrc: Oral  Oral Axillary  SpO2: (!) 89%  (!) 82% (!) 86%  Weight:  109.8 kg    Height:        Wt Readings from Last 3 Encounters:  01/24/20 109.8 kg  01/08/20 115.6 kg  07/15/19 102.9 kg     Intake/Output Summary (Last 24 hours) at 01/24/2020 1534 Last data filed at 01/24/2020  1301 Gross per 24 hour  Intake 220 ml  Output 2400 ml  Net -2180 ml    Physical Exam Gen Exam:Alert awake-not in any distress HEENT:atraumatic, normocephalic Chest: Bibasilar rales CVS:S1S2 regular Abdomen:soft non tender, non distended Extremities:++ edema Neurology: Non focal Skin: no rash   Data Review:    CBC Recent Labs  Lab 01/18/20 0702 01/19/20 0534 01/20/20 0547 01/21/20 1115 01/22/20 1042 01/23/20 0721 01/24/20 0812  WBC 20.4*   < > 21.4* 25.9* 34.2* 39.5* 38.5*  HGB 11.3*   < > 11.1* 11.6* 12.1 12.7 10.7*  HCT 34.2*   < > 33.5* 37.3 36.2 38.9 33.3*  PLT 336   < > 355 334 315 297 244  MCV 91.2   < > 91.5 97.4 91.6 95.1 95.7  MCH 30.1   < > 30.3 30.3 30.6 31.1 30.7  MCHC 33.0   < > 33.1 31.1 33.4 32.6 32.1  RDW 16.9*   < > 16.7* 17.8* 18.1* 19.1* 18.9*  LYMPHSABS 1.5  --   --   --   --   --  1.7  MONOABS 1.3*  --   --   --   --   --  2.0*  EOSABS 0.0  --   --   --   --   --  0.2  BASOSABS 0.0  --   --   --   --   --  0.1   < > = values in this interval not displayed.    Chemistries  Recent Labs  Lab 01/18/20 0702 01/18/20 0702 01/19/20 0534 01/19/20 0534 01/20/20 0547 01/21/20 1115 01/22/20 1042 01/23/20 1012 01/24/20 0812  NA 138   < > 139   < > 141 139 139 139 140  K 4.8   < > 5.3*   < > 5.3* 4.1 4.1 5.0 4.2  CL 107   < > 109   < > 107 103 103 104 102  CO2 18*   < > 18*   < > 20* 24 22 19* 24  GLUCOSE 178*   < > 227*   < > 155* 132* 121* 136* 71  BUN 101*   < > 100*   < > 96* 27* 84* 72* 70*  CREATININE 2.98*   < > 2.74*   < > 2.34* 0.95 2.10* 1.84* 1.98*  CALCIUM 8.7*   < > 8.6*   < > 8.7* 8.0* 8.4* 8.1* 8.4*  MG 2.4  --   --   --   --   --   --   --   --  AST 61*   < > 63*  --  59* 105* 51* 69*  --   ALT 38   < > 41  --  36 57* 38 36  --   ALKPHOS 73   < > 70  --  67 64 74 75  --   BILITOT 0.8   < > 1.0  --  1.0 0.8 1.4* 1.2  --    < > = values in this interval not displayed.    ------------------------------------------------------------------------------------------------------------------ No results for input(s): CHOL, HDL, LDLCALC, TRIG, CHOLHDL, LDLDIRECT in the last 72 hours.  Lab Results  Component Value Date   HGBA1C 12.2 (H) 01/05/2020   ------------------------------------------------------------------------------------------------------------------ No results for input(s): TSH, T4TOTAL, T3FREE, THYROIDAB in the last 72 hours.  Invalid input(s): FREET3 ------------------------------------------------------------------------------------------------------------------ No results for input(s): VITAMINB12, FOLATE, FERRITIN, TIBC, IRON, RETICCTPCT in the last 72 hours.  Coagulation profile No results for input(s): INR, PROTIME in the last 168 hours.  Recent Labs    01/22/20 0739 01/24/20 0812  DDIMER 5.04* 5.22*    Cardiac Enzymes No results for input(s): CKMB, TROPONINI, MYOGLOBIN in the last 168 hours.  Invalid input(s): CK ------------------------------------------------------------------------------------------------------------------    Component Value Date/Time   BNP 130.9 (H) 01/24/2020 [id number]    Micro Results No results found for this or any previous visit (from the past 240 hour(s)).  Radiology Reports DG Chest 2 View  Result Date: 01/13/2020 CLINICAL DATA:  Shortness of breath and bilateral leg swelling. EXAM: CHEST - 2 VIEW COMPARISON:  November 30, 2016 FINDINGS: Moderate severity diffuse multifocal infiltrates are seen, bilaterally. There is no evidence of a pleural effusion or pneumothorax. The cardiac silhouette is mildly enlarged. A radiopaque fusion plate and screws are seen overlying the lower cervical spine. The visualized skeletal structures are otherwise unremarkable. Radiopaque surgical clips are seen within the right upper quadrant. IMPRESSION: Moderate severity bilateral multifocal infiltrates. Electronically Signed   By:  Virgina Norfolk M.D.   On: 01/13/2020 00:27   NM Pulmonary Perfusion  Result Date: 01/16/2020 CLINICAL DATA:  Shortness of breath, history of COVID-19 positivity EXAM: NUCLEAR MEDICINE PERFUSION LUNG SCAN TECHNIQUE: Perfusion images were obtained in multiple projections after intravenous injection of radiopharmaceutical. Ventilation scans intentionally deferred if perfusion scan and chest x-ray adequate for interpretation during COVID 19 epidemic. RADIOPHARMACEUTICALS:  1.6 mCi Tc-18m MAA IV COMPARISON:  Chest x-ray from earlier in the same day. FINDINGS: Perfusion images demonstrate adequate uptake throughout both lungs despite the changes seen on recent chest x-ray. No focal defect to suggest pulmonary embolism is noted. IMPRESSION: No evidence of pulmonary embolism. Electronically Signed   By: Inez Catalina M.D.   On: 01/16/2020 16:23   US RENAL  Result Date: 01/16/2020 CLINICAL DATA:  Acute renal insufficiency. EXAM: RENAL / URINARY TRACT ULTRASOUND COMPLETE COMPARISON:  None. FINDINGS: Exam difficult due to patient body habitus. Right Kidney: Renal measurements: 9.4 x 4.2 x 4.5 cm = volume: 94 mL . Echogenicity within normal limits. Mild cortical thinning. No mass or hydronephrosis visualized. Left Kidney: Renal measurements: 10.1 x 5.2 x 5.5 cm = volume: 150 mL. Echogenicity within normal limits. Mild cortical thinning. No mass or hydronephrosis visualized. Bladder: Not visualized. Other: None. IMPRESSION: Normal size kidneys without hydronephrosis. Electronically Signed   By: Marin Olp M.D.   On: 01/16/2020 16:38   US RENAL  Result Date: 01/06/2020 CLINICAL DATA:  Chronic kidney disease EXAM: RENAL / URINARY TRACT ULTRASOUND COMPLETE COMPARISON:  None. FINDINGS: Right Kidney: Renal measurements: 9.3 x 4.3 x  4.6 cm = volume: 96 mL. Lobulated contour. No hydronephrosis. Normal echogenicity. Left Kidney: Renal measurements: 10.7 x 5.0 x 5.2 cm = volume: 145 mL. Lobulated contour without  hydronephrosis. Normal echogenicity. Bladder: Decompressed. Other: Visualization is generally limited by body habitus. IMPRESSION: No hydronephrosis. Lobulated renal contours without focal parenchymal abnormality. Electronically Signed   By: Ulyses Jarred M.D.   On: 01/06/2020 16:16   PERIPHERAL VASCULAR CATHETERIZATION  Result Date: 01/08/2020 Patient name: [name]   MRN: [id number]        DOB: [birth year]            Sex: female  01/08/2020 Pre-operative Diagnosis: Critical limb ischemia of the left lower extremity with tissue loss Post-operative diagnosis:  Same Surgeon:  Marty Heck, MD Procedure Performed: 1.  Ultrasound guided access of the right common femoral artery 2.  Aortogram with CO2 including catheter selection of the aorta 3.  Left lower extremity arteriogram with selection of third order branches 4.  Left distal SFA above-knee popliteal artery angioplasty with stent placement (primarily stented with a 6 mm x 60 mm drug-coated Eluvia postdilated with a 5 mm Mustang) 5.  Left anterior tibial angioplasty for focal perforation with resolution of the perforation (3 mm Sterling) 6.  Mynx closure of the right common femoral artery 7.  55 minutes of monitored moderate conscious sedation time   Indications: Patient is a 64 year old female who was seen in consultation by Dr. Trula Slade for left lower extremity rest pain with severely depressed ABIs and duplex showing likely popliteal disease.  She presents today for left lower extremity arteriogram after risk benefits discussed.  Findings:  Aortogram was performed with CO2 to limit contrast and there was no flow-limiting aortoiliac stenosis.  Left lower extremity arteriogram showed a patent common femoral profunda and widely patent SFA in the proximal to mid segment.  At [facility] canal in the distal SFA and above-knee popliteal artery showed a very short focal occlusion with large collateral around this.  She had a patent below-knee popliteal  artery with three-vessel runoff.  Anterior tibial appeared to be dominant runoff.   Ultimately this lesion was crossed in the distal SFA/above knee popliteal artery and primarily stented with a 6 mm x 60 mm Eluvia.  She now has excellent inline flow down the left lower extremity with no residual stenosis and three-vessel runoff.  There was a focal perforation in the anterior tibial where our wire likely made a perforation during exchange of balloons and stents.  Ultimately this was treated with a very low inflation of a 3 mm Sterling for 3 minutes with resolution.  Patient has preserved three vessel runoff.              Procedure:  The patient was identified in the holding area and taken to room 8.  The patient was then placed supine on the table and prepped and draped in the usual sterile fashion.  A time out was called.  Ultrasound was used to evaluate the right common femoral artery.  It was patent .  A digital ultrasound image was acquired.  A micropuncture needle was used to access the right common femoral artery under ultrasound guidance.  An 018 wire was advanced without resistance and a micropuncture sheath was placed.  The 018 wire was removed and a benson wire was placed.  The micropuncture sheath was exchanged for a 5 french sheath.  An omniflush catheter was advanced over the wire to the level of L-1.  An abdominal  angiogram was obtained.  Next, using glidewire and straight 4 French flush catheter the aortic bifurcation was cross because the Omni would not track.  Placed the catheter into theleft external iliac artery and left runoff was obtained.  Ultimately after evaluating the pictures elected to intervene on the distal left SFA above-knee popliteal occlusion.  Used a long Glidewire advantage down the SFA and exchanged for a 6 [place] Ansell sheath in the right groin over the aortic bifurcation.  Patient was given 100 units per kilogram heparin.  I then primarily stented this lesion with a 6 mm x 60  mm Eluvia that was postdilated with a 5 mm x 60 mm Mustang.  Final pictures showed no residual stenosis with excellent inline flow down the left lower extremity and three-vessel runoff.  There was a focal perforation in the proximal anterior tibial that I think was from our wire.  I then exchanged for a V 18 wire down the anterior tibial and then did a very slow inflation with a 3 mm Sterling in the left anterior tibial artery for 3 minutes.  There appeared to be resolution of the perforation after taking multiple additional pictures.  Wires and catheters removed and a short 6 French sheath was placed in the right groin.  We placed a mynx closure device.   Plan: Patient will be loaded on Plavix and will need to continue aspirin and Plavix.  Should have inline flow down left leg now.   Marty Heck, MD [facility] Office: [phone]   DG Chest Port 1V same Day  Result Date: 01/24/2020 CLINICAL DATA:  COVID-19 positive.  Respiratory distress EXAM: PORTABLE CHEST 1 VIEW COMPARISON:  January 16, 2020 FINDINGS: There is extensive interstitial and airspace opacity throughout the lungs bilaterally with increase in consolidation in the bases compared to most recent study. There is cardiomegaly with pulmonary venous hypertension. No adenopathy. There is aortic atherosclerosis. Postoperative change noted in the lower cervical spine. IMPRESSION: Widespread interstitial and airspace opacity bilaterally. Cardiomegaly with pulmonary vascular congestion. Suspect a degree of congestive heart failure with multifocal pneumonia superimposed. Increased opacity in the lung bases compared to prior study. Aortic Atherosclerosis (ICD10-I70.0). Electronically Signed   By: Lowella Grip III M.D.   On: 01/24/2020 10:33   DG Chest Port 1V same Day  Result Date: 01/16/2020 CLINICAL DATA:  Shortness of breath.  COVID. EXAM: PORTABLE CHEST 1 VIEW COMPARISON:  Three days ago FINDINGS: More  confluent bilateral interstitial and airspace opacity. Cardiomegaly. No visible effusion or pneumothorax. IMPRESSION: Worsening bilateral pneumonia. Electronically Signed   By: Monte Fantasia M.D.   On: 01/16/2020 08:34   DG Foot Complete Left  Result Date: 01/05/2020 CLINICAL DATA:  Swelling EXAM: LEFT FOOT - COMPLETE 3+ VIEW COMPARISON:  None. FINDINGS: Degenerative changes in the 1st MTP joint. No acute bony abnormality. Specifically, no fracture, subluxation, or dislocation. Vascular calcifications noted. No bone destruction seen to suggest osteomyelitis. IMPRESSION: No acute bony abnormality. Electronically Signed   By: Rolm Baptise M.D.   On: 01/05/2020 01:25   VAS [place] ABI WITH/WO TBI  Result Date: 01/05/2020 LOWER EXTREMITY DOPPLER STUDY Indications: Blue, cold toes. High Risk Factors: Hypertension, Diabetes.  Limitations: Today's exam was limited due to Patient body habitus. Comparison Study: No prior studies. Performing Technologist: Carlos Levering Rvt  Examination Guidelines: A complete evaluation includes at minimum, Doppler waveform signals and systolic blood pressure reading at the level of bilateral brachial, anterior tibial, and posterior tibial arteries, when  vessel segments are accessible. Bilateral testing is considered an integral part of a complete examination. Photoelectric Plethysmograph (PPG) waveforms and toe systolic pressure readings are included as required and additional duplex testing as needed. Limited examinations for reoccurring indications may be performed as noted.  ABI Findings: +---------+------------------+-----+---------+--------+ Right    Rt Pressure (mmHg)IndexWaveform Comment  +---------+------------------+-----+---------+--------+ Brachial 150                    triphasic         +---------+------------------+-----+---------+--------+ PTA      108               0.72 biphasic          +---------+------------------+-----+---------+--------+ DP        137               0.91 triphasic         +---------+------------------+-----+---------+--------+ Great Toe76                0.51                   +---------+------------------+-----+---------+--------+ +---------+------------------+-----+----------+-------+ Left     Lt Pressure (mmHg)IndexWaveform  Comment +---------+------------------+-----+----------+-------+ Brachial 136                    triphasic         +---------+------------------+-----+----------+-------+ PTA      66                0.44 monophasic        +---------+------------------+-----+----------+-------+ DP       53                0.35 monophasic        +---------+------------------+-----+----------+-------+ Great Toe0                 0.00                   +---------+------------------+-----+----------+-------+ +-------+-----------+-----------+------------+------------+ ABI/TBIToday's ABIToday's TBIPrevious ABIPrevious TBI +-------+-----------+-----------+------------+------------+ Right  0.91       0.51                                +-------+-----------+-----------+------------+------------+ Left   0.44       0                                   +-------+-----------+-----------+------------+------------+  Summary: Right: Resting right ankle-brachial index indicates mild right lower extremity arterial disease. The right toe-brachial index is abnormal. Left: Resting left ankle-brachial index indicates severe left lower extremity arterial disease. The left toe-brachial index is abnormal.  *See table(s) above for measurements and observations.  Electronically signed by [name] MD on 01/05/2020 at 7:35:40 PM.    Final    VAS [place] LOWER EXTREMITY ARTERIAL DUPLEX  Result Date: 01/05/2020 LOWER EXTREMITY ARTERIAL DUPLEX STUDY Indications: Blue, cold toes. High Risk Factors: Hypertension, Diabetes.  Current ABI: R 0.91 L 0.44 Limitations: Patient body habitus, poor ultrasound/tissue  interface. Comparison Study: No prior studies. Performing Technologist: Oliver Hum RVT  Examination Guidelines: A complete evaluation includes B-mode imaging, spectral Doppler, color Doppler, and power Doppler as needed of all accessible portions of each vessel. Bilateral testing is considered an integral part of a complete examination. Limited examinations for reoccurring indications may be performed as noted.  +-----------+--------+-----+--------+----------+------------------+ LEFT  PSV cm/sRatioStenosisWaveform  Comments           +-----------+--------+-----+--------+----------+------------------+ EIA Distal 116                  biphasic                     +-----------+--------+-----+--------+----------+------------------+ CFA Mid    132                  biphasic                     +-----------+--------+-----+--------+----------+------------------+ DFA        111                  biphasic                     +-----------+--------+-----+--------+----------+------------------+ SFA Prox   104                  biphasic                     +-----------+--------+-----+--------+----------+------------------+ SFA Mid    85                   biphasic                     +-----------+--------+-----+--------+----------+------------------+ SFA Distal 91                   biphasic                     +-----------+--------+-----+--------+----------+------------------+ POP Prox   26                   monophasic                   +-----------+--------+-----+--------+----------+------------------+ POP Distal 0                              Unable to insonate +-----------+--------+-----+--------+----------+------------------+ ATA Prox   73                   biphasic                     +-----------+--------+-----+--------+----------+------------------+ ATA Mid    33                   monophasic                    +-----------+--------+-----+--------+----------+------------------+ ATA Distal 32                   monophasic                   +-----------+--------+-----+--------+----------+------------------+ PTA Prox                                  Unable to insonate +-----------+--------+-----+--------+----------+------------------+ PTA Mid    27                   monophasic                   +-----------+--------+-----+--------+----------+------------------+ PTA Distal 9                    monophasic                   +-----------+--------+-----+--------+----------+------------------+  PERO Prox                                 Unable to insonate +-----------+--------+-----+--------+----------+------------------+ PERO Mid                                  Unable to insonate +-----------+--------+-----+--------+----------+------------------+ PERO Distal15                   monophasic                   +-----------+--------+-----+--------+----------+------------------+ DP         29                   monophasic                   +-----------+--------+-----+--------+----------+------------------+  Summary: Left: The external iliac, common femoral, and superficial femoral arteries appear patent with no significant stenosis. Monophasic waveforms are noted in the proximal popliteal artery with poor visualization of the distal segment. Which suggests a possible occlusion. Anterior tibial artery appears patent throughout with monophasic flow. The posterior tibial, and peroneal arteries demonstrated monophasic flow with likely areas of occlusive disease. The proximal calf arteries were difficult to visualize.  See table(s) above for measurements and observations. Electronically signed by [name] MD on 01/05/2020 at 69:34:41 PM.    Final    VAS [place] LOWER EXTREMITY VENOUS (DVT)  Result Date: 01/13/2020  Lower Venous DVTStudy Indications: Swelling.  Limitations: Body habitus  and poor ultrasound/tissue interface. Performing Technologist: Antonieta Pert RDMS, RVT  Examination Guidelines: A complete evaluation includes B-mode imaging, spectral Doppler, color Doppler, and power Doppler as needed of all accessible portions of each vessel. Bilateral testing is considered an integral part of a complete examination. Limited examinations for reoccurring indications may be performed as noted. The reflux portion of the exam is performed with the patient in reverse Trendelenburg.  +---------+---------------+---------+-----------+----------+-------------------+ RIGHT    CompressibilityPhasicitySpontaneityPropertiesThrombus Aging      +---------+---------------+---------+-----------+----------+-------------------+ CFV      Partial        Yes      Yes                  patient body                                                              habitus and                                                               position limiting                                                         evaluation          +---------+---------------+---------+-----------+----------+-------------------+  SFJ      Full                                                             +---------+---------------+---------+-----------+----------+-------------------+ FV Prox                          Yes                                      +---------+---------------+---------+-----------+----------+-------------------+ FV Mid                           Yes                                      +---------+---------------+---------+-----------+----------+-------------------+ FV Distal                        Yes                                      +---------+---------------+---------+-----------+----------+-------------------+ PFV      Full                                                              +---------+---------------+---------+-----------+----------+-------------------+ POP      Full                                                             +---------+---------------+---------+-----------+----------+-------------------+ PTV                                                   not clearly                                                               visualized          +---------+---------------+---------+-----------+----------+-------------------+ PERO                                                  not clearly  visualized          +---------+---------------+---------+-----------+----------+-------------------+   +---------+---------------+---------+-----------+----------+------------------+ LEFT     CompressibilityPhasicitySpontaneityPropertiesThrombus Aging     +---------+---------------+---------+-----------+----------+------------------+ CFV      Full           Yes      Yes                                     +---------+---------------+---------+-----------+----------+------------------+ SFJ      Full                                                            +---------+---------------+---------+-----------+----------+------------------+ FV Prox  Full                                                            +---------+---------------+---------+-----------+----------+------------------+ FV Mid   Full                                                            +---------+---------------+---------+-----------+----------+------------------+ FV Distal                        Yes                                     +---------+---------------+---------+-----------+----------+------------------+ PFV      Full                                                            +---------+---------------+---------+-----------+----------+------------------+ POP      Full            Yes      Yes                                     +---------+---------------+---------+-----------+----------+------------------+ PTV                                                   not clearly                                                              visualized         +---------+---------------+---------+-----------+----------+------------------+ PERO  not clearly                                                              visualized         +---------+---------------+---------+-----------+----------+------------------+    Summary: RIGHT: - There is no evidence of deep vein thrombosis in the lower extremity. However, portions of this examination were limited- see technologist comments above.  LEFT: - There is no evidence of deep vein thrombosis in the lower extremity. However, portions of this examination were limited- see technologist comments above.  - Prominent lymph nodes noted bilateral groin areas.  *See table(s) above for measurements and observations. Electronically signed by Deitra Mayo MD on 01/13/2020 at 6:50:27 PM.    Final    ECHOCARDIOGRAM LIMITED  Result Date: 01/15/2020    ECHOCARDIOGRAM LIMITED REPORT   Patient Name:   [name] Date of Exam: 01/15/2020 Medical Rec #:  [id number]     Height:       61.0 in Accession #:    [id number]    Weight:       249.3 lb Date of Birth:  [birth year]      BSA:          2.074 m Patient Age:    4 years      BP:           131/70 mmHg Patient Gender: F             HR:           53 bpm. Exam Location:  Inpatient Procedure: Limited Echo, Limited Color Doppler and Cardiac Doppler Indications:    Acute Respiratory Insufficiency R06.89  History:        Patient has prior history of Echocardiogram examinations, most                 recent 12/02/2016. Risk Factors:Hypertension and Dyslipidemia.  Sonographer:    Mikki Santee RDCS (AE) Referring Phys: [place]  1. Left ventricular diastolic parameters are consistent with Grade II diastolic dysfunction (pseudonormalization).  2. Right ventricular systolic function is moderately reduced. There is severely elevated pulmonary artery systolic pressure.  3. The inferior vena cava is normal in size with greater than 50% respiratory variability, suggesting right atrial pressure of 3 mmHg. FINDINGS  Left Ventricle: Indeterminate filling pressures. Right Ventricle: Right ventricular systolic function is moderately reduced. There is severely elevated pulmonary artery systolic pressure. The tricuspid regurgitant velocity is 3.86 m/s, and with an assumed right atrial pressure of 3 mmHg, the estimated right ventricular systolic pressure is [id number] mmHg. Tricuspid Valve: Tricuspid valve regurgitation is mild. Venous: The inferior vena cava is normal in size with greater than 50% respiratory variability, suggesting right atrial pressure of 3 mmHg.  LEFT VENTRICLE PLAX 2D LVOT diam:     2.10 cm  Diastology LV SV:         81       LV e' lateral:   5.61 cm/s LV SV Index:   39       LV E/e' lateral: 12.1 LVOT Area:     3.46 cm LV e' medial:    6.40 cm/s  LV E/e' medial:  10.6  RIGHT VENTRICLE TAPSE (M-mode): 1.5 cm LEFT ATRIUM           Index       RIGHT ATRIUM           Index LA Vol (A4C): 37.4 ml 18.03 ml/m RA Area:     16.80 cm                                   RA Volume:   44.50 ml  21.45 ml/m  AORTIC VALVE LVOT Vmax:   98.60 cm/s LVOT Vmean:  69.800 cm/s LVOT VTI:    0.235 m MITRAL VALVE               TRICUSPID VALVE MV Area (PHT): 3.53 cm    TR Peak grad:   59.6 mmHg MV Decel Time: 215 msec    TR Vmax:        386.00 cm/s MV E velocity: 67.70 cm/s MV A velocity: 65.10 cm/s  SHUNTS MV E/A ratio:  1.04        Systemic VTI:  0.24 m                            Systemic Diam: 2.10 cm Skeet Latch MD Electronically signed by Skeet Latch MD Signature Date/Time: 01/15/2020/3:50:03 PM    Final

## 2020-01-24 NOTE — Plan of Care (Signed)
  Problem: Clinical Measurements: Goal: Respiratory complications will improve Outcome: Progressing   

## 2020-01-24 NOTE — Progress Notes (Signed)
Pt slept during the night.   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  Pt was able to put forth minimal effort during this transition.   No new complaints. No acute events overnight.

## 2020-01-25 LAB — CBC
HCT: 32.9 % — ABNORMAL LOW (ref 36.0–46.0)
Hemoglobin: 10.6 g/dL — ABNORMAL LOW (ref 12.0–15.0)
MCH: 31 pg (ref 26.0–34.0)
MCHC: 32.2 g/dL (ref 30.0–36.0)
MCV: 96.2 fL (ref 80.0–100.0)
Platelets: 202 10*3/uL (ref 150–400)
RBC: 3.42 MIL/uL — ABNORMAL LOW (ref 3.87–5.11)
RDW: 19.4 % — ABNORMAL HIGH (ref 11.5–15.5)
WBC: 31.6 10*3/uL — ABNORMAL HIGH (ref 4.0–10.5)
nRBC: 1.8 % — ABNORMAL HIGH (ref 0.0–0.2)

## 2020-01-25 LAB — GLUCOSE, CAPILLARY
Glucose-Capillary: 68 mg/dL — ABNORMAL LOW (ref 70–99)
Glucose-Capillary: 101 mg/dL — ABNORMAL HIGH (ref 70–99)
Glucose-Capillary: 228 mg/dL — ABNORMAL HIGH (ref 70–99)
Glucose-Capillary: 185 mg/dL — ABNORMAL HIGH (ref 70–99)
Glucose-Capillary: 203 mg/dL — ABNORMAL HIGH (ref 70–99)

## 2020-01-25 LAB — COMPREHENSIVE METABOLIC PANEL
ALT: 53 U/L — ABNORMAL HIGH (ref 0–44)
AST: 64 U/L — ABNORMAL HIGH (ref 15–41)
Albumin: 2.7 g/dL — ABNORMAL LOW (ref 3.5–5.0)
Alkaline Phosphatase: 71 U/L (ref 38–126)
Anion gap: 11 (ref 5–15)
BUN: 57 mg/dL — ABNORMAL HIGH (ref 8–23)
CO2: 25 mmol/L (ref 22–32)
Calcium: 8.4 mg/dL — ABNORMAL LOW (ref 8.9–10.3)
Chloride: 105 mmol/L (ref 98–111)
Creatinine, Ser: 1.74 mg/dL — ABNORMAL HIGH (ref 0.44–1.00)
GFR calc Af Amer: 36 mL/min — ABNORMAL LOW (ref >60)
GFR calc non Af Amer: 31 mL/min — ABNORMAL LOW (ref >60)
Glucose, Bld: 69 mg/dL — ABNORMAL LOW (ref 70–99)
Potassium: 3.7 mmol/L (ref 3.5–5.1)
Sodium: 141 mmol/L (ref 135–145)
Total Bilirubin: 1.1 mg/dL (ref 0.3–1.2)
Total Protein: 5.2 g/dL — ABNORMAL LOW (ref 6.5–8.1)

## 2020-01-25 LAB — BRAIN NATRIURETIC PEPTIDE: B Natriuretic Peptide: 215 pg/mL — ABNORMAL HIGH (ref 0.0–100.0)

## 2020-01-25 LAB — MAGNESIUM: Magnesium: 2.4 mg/dL (ref 1.7–2.4)

## 2020-01-25 LAB — D-DIMER, QUANTITATIVE: D-Dimer, Quant: 5.94 ug/mL-FEU — ABNORMAL HIGH (ref 0.00–0.50)

## 2020-01-25 LAB — C-REACTIVE PROTEIN: CRP: 0.7 mg/dL (ref <1.0)

## 2020-01-25 MED ORDER — DEXTROSE 50 % IV SOLN
INTRAVENOUS | Status: AC
  Filled 2020-01-25: qty 50

## 2020-01-25 MED ORDER — DEXTROSE 50 % IV SOLN
50.0000 mL | Freq: Once | INTRAVENOUS | Status: AC
  Administered 2020-01-25: 50 mL via INTRAVENOUS

## 2020-01-25 MED ORDER — LACTATED RINGERS IV BOLUS
250.0000 mL | Freq: Once | INTRAVENOUS | Status: AC
  Administered 2020-01-25: 250 mL via INTRAVENOUS

## 2020-01-25 MED ORDER — ALPRAZOLAM 0.5 MG PO TABS
0.5000 mg | ORAL_TABLET | Freq: Two times a day (BID) | ORAL | Status: DC | PRN
  Administered 2020-01-26 – 2020-01-27 (×3): 0.5 mg via ORAL
  Filled 2020-01-25 (×3): qty 1

## 2020-01-25 MED ORDER — INSULIN GLARGINE 100 UNIT/ML ~~LOC~~ SOLN
12.0000 [IU] | Freq: Two times a day (BID) | SUBCUTANEOUS | Status: DC
  Administered 2020-01-25: 12 [IU] via SUBCUTANEOUS
  Filled 2020-01-25 (×4): qty 0.12

## 2020-01-25 MED ORDER — POTASSIUM CHLORIDE 20 MEQ PO PACK
40.0000 meq | PACK | Freq: Once | ORAL | Status: AC
  Administered 2020-01-25: 40 meq via ORAL
  Filled 2020-01-25: qty 2

## 2020-01-25 MED ORDER — FUROSEMIDE 10 MG/ML IJ SOLN
80.0000 mg | Freq: Every day | INTRAMUSCULAR | Status: DC
  Administered 2020-01-25: 80 mg via INTRAVENOUS
  Filled 2020-01-25: qty 8

## 2020-01-25 NOTE — Progress Notes (Signed)
Hypoglycemic Event  CBG: 68  Treatment: 4 oz juice/soda  Symptoms: Nervous/irritable  Follow-up CBG: Time: 0838 CBG Result: 101  Possible Reasons for Event: Inadequate meal intake  Comments/MD notified: Dr. Barbarann Ehlers

## 2020-01-25 NOTE — TOC Progression Note (Signed)
Transition of Care [facility]) - Progression Note    Patient Details  Name: [name] MRN: [id number] Date of Birth: March 14, 1956  Transition of Care [facility] CM/SW [place], [place] Phone Number: 01/25/2020, 4:16 PM  Clinical Narrative:     Patient not ready for discharge per MD.   [facility] team will continue to follow.  Expected Discharge Plan: Skilled Nursing Facility Barriers to Discharge: Insurance Authorization  Expected Discharge Plan and Services Expected Discharge Plan: [place] Choice: [place] arrangements for the past 2 months: Single Family Home                                       Social Determinants of Health (SDOH) Interventions    Readmission Risk Interventions Readmission Risk Prevention Plan 01/09/2020  Transportation Screening Complete  PCP or Specialist Appt within 3-5 Days Complete  HRI or [place] Complete  Social Work Consult for [place] Planning/Counseling Complete  Palliative Care Screening Not Applicable  Medication Review Press photographer) Complete  Some recent data might be hidden

## 2020-01-25 NOTE — Progress Notes (Signed)
Pt said that she wanted to try CPAP last night.  Respiratory came to set up, however she did not have the CPAP on during rounds.  Pt slept sitting upright most of the night.  Pt still complaining of pain on her buttocks.  We are repositioning her as needed.  She is declining any pain medication.  Otherwise, no new complaints. No acute events overnight.

## 2020-01-25 NOTE — Progress Notes (Signed)
PROGRESS NOTE                                                                                                                                                                                                             Patient Demographics:    [name], is a 64 y.o. female, DOB - November 22, 1955, [id number]  Outpatient Primary MD for the patient is Pavelock, Ralene Bathe, MD   Admit date - 01/04/2020   LOS - 12  Chief Complaint  Patient presents with  . Shortness of Breath  . Chest Pain       Brief Narrative: Patient is a 64 y.o. female  PMHx of hypertension, HLD, DM-2, CKD stage IV, PVD s/p stent to left distal SFA on [id number], chronic diastolic heart failure-presented with shortness of breath or cough-found to have acute hypoxic respiratory failure secondary to COVID-19 pneumonia and decompensated diastolic heart failure.  Significant Events: 4/18-4/23>> admit to [facility]-s/p stent to left distal SFA 4/27>> admit to [facility] for hypoxia from Covid/heart failure 4/27>> lower extremity Dopplers negative for DVT. [id number] grade 2 diastolic dysfunction, moderate RV systolic dysfunction.  RVSP 62.6 mmHg. 4/30>> VQ scan-low probability PE 5/5>> improved-on room air 5/8>> worsening hypoxemia-on 15 L of oxygen-with dramatic response to Lasix-down to 4 L  COVID-19 medications: Steroids: 4/27>>5/8 (refused on 5/6) Remdesivir: 4/27>> 5/1  Antibiotics: Vancomycin: 4/27 x 1 Cefepime: 4/27 x 1  Microbiology data: 4/27: Blood culture>> no growth 4/28: Blood culture>> no growth  DVT prophylaxis: SQ heparin  Procedures: None  Consults: Renal    Subjective:   Patient in bed, appears comfortable, denies any headache, no fever, no chest pain or pressure, no shortness of breath , no abdominal pain. No focal weakness.  Feels slightly weak as her sugars were low this morning.   Assessment  & Plan :   Acute Hypoxic Resp Failure  due to Covid 19 Viral pneumonia and decompensated diastolic heart failure EF 50% : Had improved and was on room air for the past several days-developed pulmonary edema overnight on 01/24/2020, responding well to IV Lasix and now down to 1 L nasal cannula oxygen.  Continue to monitor and adjust..     TTE   1. Left ventricular diastolic parameters are consistent with Grade II diastolic dysfunction (pseudonormalization). EF 50%. 2. Right ventricular systolic function is moderately reduced. There  is severely elevated pulmonary artery systolic pressure.  3. The inferior vena cava is normal in size with greater than 50% respiratory variability, suggesting right atrial pressure of 3 mmHg.    COVID-19 pneumonia: He had severe disease and has completed her course of remdesivir, also received Actemra.  Steroids were not used regularly as patient tended to refuse it from time to time.    Recent Labs  Lab 01/19/20 0534 01/19/20 0534 01/20/20 0547 01/20/20 0547 01/21/20 1115 01/22/20 0739 01/22/20 1042 01/23/20 1012 01/24/20 0812 01/25/20 0800  NA 139   < > 141   < > 139  --  139 139 140 141  K 5.3*   < > 5.3*   < > 4.1  --  4.1 5.0 4.2 3.7  CL 109   < > 107   < > 103  --  103 104 102 105  CO2 18*   < > 20*   < > 24  --  22 19* 24 25  GLUCOSE 227*   < > 155*   < > 132*  --  121* 136* 71 69*  BUN 100*   < > 96*   < > 27*  --  84* 72* 70* 57*  CREATININE 2.74*   < > 2.34*   < > 0.95  --  2.10* 1.84* 1.98* 1.74*  CALCIUM 8.6*   < > 8.7*   < > 8.0*  --  8.4* 8.1* 8.4* 8.4*  AST 63*   < > 59*  --  105*  --  51* 69*  --  64*  ALT 41   < > 36  --  57*  --  38 36  --  53*  ALKPHOS 70   < > 67  --  64  --  74 75  --  71  BILITOT 1.0   < > 1.0  --  0.8  --  1.4* 1.2  --  1.1  ALBUMIN 2.7*   < > 2.7*  --  2.6*  --  2.7* 2.6*  --  2.7*  CRP 3.6*   < > 2.5*  --  5.7* 1.3*  --   --  0.7 0.7  DDIMER 4.50*  --  4.79*  --   --  5.04*  --   --  5.22* 5.94*  PROCALCITON  --   --   --   --   --   --   --    --  0.21  --   BNP  --   --   --   --   --   --   --   --  130.9* 215.0*   < > = values in this interval not displayed.    Recent Labs  Lab 01/19/20 0534 01/19/20 0534 01/20/20 0547 01/21/20 1115 01/22/20 0739 01/24/20 0812 01/25/20 0800  CRP 3.6*   < > 2.5* 5.7* 1.3* 0.7 0.7  DDIMER 4.50*  --  4.79*  --  5.04* 5.22* 5.94*  BNP  --   --   --   --   --  130.9* 215.0*  PROCALCITON  --   --   --   --   --  0.21  --    < > = values in this interval not displayed.       Decompensated diastolic heart failure: Had improved with IV diuretics-with stable volume status-was on Demadex 40 mg daily for the past few days-overnight-has developed worsening hypoxemia (back on  15 L)-more lower extremity edema-her weight has also started to creep up.  Given 80 mg of IV Lasix this morning-by this afternoon significant diuresis-improvement in hypoxemia-she is down to 4 L.  Repeat 80 mg of IV Lasix this evening-suspect that she will probably need to be transition back to her usual regimen of 60 mg of Demadex maintenance dose.Continue to monitor volume status/renal function.  Reassess on 5/9-for repeat IV Lasix vs Demadex  Filed Weights   01/23/20 0500 01/24/20 0432 01/25/20 0500  Weight: 106.2 kg 109.8 kg 106.4 kg    Pulmonary hypertension/RV dysfunction: Not seen in prior echo's-I suspect this is probably from OSA-she has been noncompliant to CPAP, she was counseled and continues to refuse it even here.  VQ scan low probability.  Will require outpatient pulmonary follow-up.  Oxygen here as needed.  AKI on CKD stage IV: Improving and back to baseline-AKI likely hemodynamically mediated.  Renal ultrasound without any hydronephrosis.    Acute metabolic encephalopathy: Was due to hypoxia now resolved.   Elevated troponin: Trend is flat-not consistent with ACS-likely secondary to underlying CKD-with some component from demand ischemia due to COVID-19/heart failure.  PAD-s/p stent to left SFA on 4/22: Both  lower extremities appear warm-continue aspirin, Plavix and statin.  HTN: BP stable-  Thrombocytopenia: Resolved.  Leukocytosis: Secondary to intermittent use of steroids, afebrile, procalcitonin stable.  Anemia of chronic disease: Close to baseline-likely related to CKD.  Gout: No flare-hold colchicine for now given AKI.  OSA: Noncompliant to CPAP-continue CPAP qhs if patient cooperates.  Obesity: BMI of 44 follow with PCP.  DM-2 with hyperglycemia and hyperglycemia (A1c 12.2 on 4/19): Poor outpatient control due to hyperglycemia, is having episodes of hypoglycemia in the morning hence Lantus dose reduced on 01/25/2020, continue sliding scale, will get insulin and diabetic education as well.  Recent Labs    01/24/20 2142 01/25/20 0810 01/25/20 0838  GLUCAP 193* 68* 101*   Lab Results  Component Value Date   HGBA1C 12.2 (H) 01/05/2020     Condition - Guarded  Family Communication  : Daughter Juluis Rainier [phone] on 01/25/2020 at 10:45 AM - voicemail  Code Status :  Full Code  Diet :  Diet Order            Diet heart healthy/carb modified Room service appropriate? Yes; Fluid consistency: Thin; Fluid restriction: 2000 mL Fluid  Diet effective now               Disposition Plan  :   Status is: Inpatient  Remains inpatient appropriate because:Inpatient level of care appropriate due to severity of illness  Dispo: The patient is from: Home              Anticipated d/c is to: Home              Anticipated d/c date is: >2 days              Patient currently is NOT medically stable to d/c.  Barriers to discharge: Worsening hypoxemia secondary to decompensated heart failure needing IV Lasix  Antimicorbials  :    Anti-infectives (From admission, onward)   Start     Dose/Rate Route Frequency Ordered Stop   01/15/20 1200  vancomycin (VANCOREADY) IVPB 1250 mg/250 mL  Status:  Discontinued     1,250 mg 166.7 mL/hr over 90 Minutes Intravenous Every 48 hours 01/13/20 1018  01/14/20 1038   01/14/20 1000  remdesivir 100 mg in sodium chloride 0.9 % 100 mL IVPB  100 mg 200 mL/hr over 30 Minutes Intravenous Daily 01/13/20 1129 01/17/20 1113   01/13/20 1630  valACYclovir (VALTREX) tablet 1,000 mg  Status:  Discontinued     1,000 mg Oral 3 times daily 01/13/20 1557 01/17/20 1147   01/13/20 1230  remdesivir 200 mg in sodium chloride 0.9% 250 mL IVPB     200 mg 580 mL/hr over 30 Minutes Intravenous Once 01/13/20 1129 01/13/20 1800   01/13/20 1015  ceFEPIme (MAXIPIME) 2 g in sodium chloride 0.9 % 100 mL IVPB     2 g 200 mL/hr over 30 Minutes Intravenous  Once 01/13/20 1003 01/13/20 1434   01/13/20 1015  vancomycin (VANCOREADY) IVPB 2000 mg/400 mL     2,000 mg 200 mL/hr over 120 Minutes Intravenous  Once 01/13/20 1012 01/13/20 2258      Inpatient Medications  Scheduled Meds: . dextrose      . vitamin C  500 mg Oral Daily  . aspirin EC  81 mg Oral Q breakfast  . atorvastatin  40 mg Oral Daily  . brimonidine  1 drop Both Eyes BID  . clopidogrel  75 mg Oral Daily  . dorzolamide-timolol  1 drop Both Eyes BID  . doxazosin  4 mg Oral QHS  . furosemide  80 mg Intravenous Daily  . heparin injection (subcutaneous)  5,000 Units Subcutaneous Q8H  . insulin aspart  0-5 Units Subcutaneous QHS  . insulin aspart  0-9 Units Subcutaneous TID WC  . insulin glargine  12 Units Subcutaneous BID  . Ipratropium-Albuterol  1 puff Inhalation Q6H  . latanoprost  1 drop Both Eyes QHS  . linagliptin  5 mg Oral Daily  . pantoprazole  40 mg Oral Q1200  . potassium chloride  40 mEq Oral Once  . prednisoLONE acetate  1 drop Right Eye BID  . sodium bicarbonate  1,300 mg Oral BID  . sodium chloride flush  3 mL Intravenous Once  . zinc sulfate  220 mg Oral Daily   Continuous Infusions:  PRN Meds:.acetaminophen, chlorpheniramine-HYDROcodone, guaiFENesin-dextromethorphan, [DISCONTINUED] ondansetron **OR** ondansetron (ZOFRAN) IV, oxyCODONE-acetaminophen   Time Spent in minutes   25  See all Orders from today for further details   Lala Lund M.D on 01/25/2020 at 10:38 AM  To page go to [email or web address] - use universal password  [facility] -  Office  [phone]    Objective:   Vitals:   01/24/20 1815 01/24/20 1824 01/25/20 0010 01/25/20 0500  BP:  (!) 127/49 (!) 123/40 (!) 125/56  Pulse:   65 67  Resp:  20 20 18   Temp:   98 F (36.7 C) 97.8 F (36.6 C)  TempSrc:   Oral Oral  SpO2: 100%  96% 100%  Weight:    106.4 kg  Height:        Wt Readings from Last 3 Encounters:  01/25/20 106.4 kg  01/08/20 115.6 kg  07/15/19 102.9 kg     Intake/Output Summary (Last 24 hours) at 01/25/2020 1038 Last data filed at 01/25/2020 0800 Gross per 24 hour  Intake 476 ml  Output 1200 ml  Net -724 ml    Physical Exam  Awake Alert, No new F.N deficits, Normal affect [place].AT,PERRAL Supple Neck,No JVD, No cervical lymphadenopathy appriciated.  Symmetrical Chest wall movement, Good air movement bilaterally, CTAB RRR,No Gallops, Rubs or new Murmurs, No Parasternal Heave +ve B.Sounds, Abd Soft, No tenderness, No organomegaly appriciated, No rebound - guarding or rigidity. No Cyanosis, Clubbing or edema, No new Rash or bruise  Data Review:    CBC Recent Labs  Lab 01/21/20 1115 01/22/20 1042 01/23/20 0721 01/24/20 0812 01/25/20 0800  WBC 25.9* 34.2* 39.5* 38.5* 31.6*  HGB 11.6* 12.1 12.7 10.7* 10.6*  HCT 37.3 36.2 38.9 33.3* 32.9*  PLT 334 315 297 244 202  MCV 97.4 91.6 95.1 95.7 96.2  MCH 30.3 30.6 31.1 30.7 31.0  MCHC 31.1 33.4 32.6 32.1 32.2  RDW 17.8* 18.1* 19.1* 18.9* 19.4*  LYMPHSABS  --   --   --  1.7  --   MONOABS  --   --   --  2.0*  --   EOSABS  --   --   --  0.2  --   BASOSABS  --   --   --  0.1  --     Chemistries  Recent Labs  Lab 01/20/20 0547 01/20/20 0547 01/21/20 1115 01/22/20 1042 01/23/20 1012 01/24/20 0812 01/25/20 0800  NA 141   < > 139 139 139 140 141  K 5.3*   < > 4.1 4.1 5.0 4.2 3.7  CL 107   < > 103 103  104 102 105  CO2 20*   < > 24 22 19* 24 25  GLUCOSE 155*   < > 132* 121* 136* 71 69*  BUN 96*   < > 27* 84* 72* 70* 57*  CREATININE 2.34*   < > 0.95 2.10* 1.84* 1.98* 1.74*  CALCIUM 8.7*   < > 8.0* 8.4* 8.1* 8.4* 8.4*  AST 59*  --  105* 51* 69*  --  64*  ALT 36  --  57* 38 36  --  53*  ALKPHOS 67  --  64 74 75  --  71  BILITOT 1.0  --  0.8 1.4* 1.2  --  1.1   < > = values in this interval not displayed.   ------------------------------------------------------------------------------------------------------------------ No results for input(s): CHOL, HDL, LDLCALC, TRIG, CHOLHDL, LDLDIRECT in the last 72 hours.  Lab Results  Component Value Date   HGBA1C 12.2 (H) 01/05/2020   ------------------------------------------------------------------------------------------------------------------ No results for input(s): TSH, T4TOTAL, T3FREE, THYROIDAB in the last 72 hours.  Invalid input(s): FREET3 ------------------------------------------------------------------------------------------------------------------ No results for input(s): VITAMINB12, FOLATE, FERRITIN, TIBC, IRON, RETICCTPCT in the last 72 hours.  Coagulation profile No results for input(s): INR, PROTIME in the last 168 hours.  Recent Labs    01/24/20 0812 01/25/20 0800  DDIMER 5.22* 5.94*    Cardiac Enzymes No results for input(s): CKMB, TROPONINI, MYOGLOBIN in the last 168 hours.  Invalid input(s): CK ------------------------------------------------------------------------------------------------------------------    Component Value Date/Time   BNP 215.0 (H) 01/25/2020 0800    Micro Results No results found for this or any previous visit (from the past 240 hour(s)).  Radiology Reports DG Chest 2 View  Result Date: 01/13/2020 CLINICAL DATA:  Shortness of breath and bilateral leg swelling. EXAM: CHEST - 2 VIEW COMPARISON:  November 30, 2016 FINDINGS: Moderate severity diffuse multifocal infiltrates are seen,  bilaterally. There is no evidence of a pleural effusion or pneumothorax. The cardiac silhouette is mildly enlarged. A radiopaque fusion plate and screws are seen overlying the lower cervical spine. The visualized skeletal structures are otherwise unremarkable. Radiopaque surgical clips are seen within the right upper quadrant. IMPRESSION: Moderate severity bilateral multifocal infiltrates. Electronically Signed   By: Virgina Norfolk M.D.   On: 01/13/2020 00:27   NM Pulmonary Perfusion  Result Date: 01/16/2020 CLINICAL DATA:  Shortness of breath, history of COVID-19 positivity EXAM: NUCLEAR MEDICINE PERFUSION LUNG SCAN  TECHNIQUE: Perfusion images were obtained in multiple projections after intravenous injection of radiopharmaceutical. Ventilation scans intentionally deferred if perfusion scan and chest x-ray adequate for interpretation during COVID 19 epidemic. RADIOPHARMACEUTICALS:  1.6 mCi Tc-86m MAA IV COMPARISON:  Chest x-ray from earlier in the same day. FINDINGS: Perfusion images demonstrate adequate uptake throughout both lungs despite the changes seen on recent chest x-ray. No focal defect to suggest pulmonary embolism is noted. IMPRESSION: No evidence of pulmonary embolism. Electronically Signed   By: Inez Catalina M.D.   On: 01/16/2020 16:23   US RENAL  Result Date: 01/16/2020 CLINICAL DATA:  Acute renal insufficiency. EXAM: RENAL / URINARY TRACT ULTRASOUND COMPLETE COMPARISON:  None. FINDINGS: Exam difficult due to patient body habitus. Right Kidney: Renal measurements: 9.4 x 4.2 x 4.5 cm = volume: 94 mL . Echogenicity within normal limits. Mild cortical thinning. No mass or hydronephrosis visualized. Left Kidney: Renal measurements: 10.1 x 5.2 x 5.5 cm = volume: 150 mL. Echogenicity within normal limits. Mild cortical thinning. No mass or hydronephrosis visualized. Bladder: Not visualized. Other: None. IMPRESSION: Normal size kidneys without hydronephrosis. Electronically Signed   By: Marin Olp M.D.   On: 01/16/2020 16:38   US RENAL  Result Date: 01/06/2020 CLINICAL DATA:  Chronic kidney disease EXAM: RENAL / URINARY TRACT ULTRASOUND COMPLETE COMPARISON:  None. FINDINGS: Right Kidney: Renal measurements: 9.3 x 4.3 x 4.6 cm = volume: 96 mL. Lobulated contour. No hydronephrosis. Normal echogenicity. Left Kidney: Renal measurements: 10.7 x 5.0 x 5.2 cm = volume: 145 mL. Lobulated contour without hydronephrosis. Normal echogenicity. Bladder: Decompressed. Other: Visualization is generally limited by body habitus. IMPRESSION: No hydronephrosis. Lobulated renal contours without focal parenchymal abnormality. Electronically Signed   By: Ulyses Jarred M.D.   On: 01/06/2020 16:16   PERIPHERAL VASCULAR CATHETERIZATION  Result Date: 01/08/2020 Patient name: [name]   MRN: [id number]        DOB: [birth year]            Sex: female  01/08/2020 Pre-operative Diagnosis: Critical limb ischemia of the left lower extremity with tissue loss Post-operative diagnosis:  Same Surgeon:  Marty Heck, MD Procedure Performed: 1.  Ultrasound guided access of the right common femoral artery 2.  Aortogram with CO2 including catheter selection of the aorta 3.  Left lower extremity arteriogram with selection of third order branches 4.  Left distal SFA above-knee popliteal artery angioplasty with stent placement (primarily stented with a 6 mm x 60 mm drug-coated Eluvia postdilated with a 5 mm Mustang) 5.  Left anterior tibial angioplasty for focal perforation with resolution of the perforation (3 mm Sterling) 6.  Mynx closure of the right common femoral artery 7.  55 minutes of monitored moderate conscious sedation time   Indications: Patient is a 64 year old female who was seen in consultation by Dr. Trula Slade for left lower extremity rest pain with severely depressed ABIs and duplex showing likely popliteal disease.  She presents today for left lower extremity arteriogram after risk benefits discussed.   Findings:  Aortogram was performed with CO2 to limit contrast and there was no flow-limiting aortoiliac stenosis.  Left lower extremity arteriogram showed a patent common femoral profunda and widely patent SFA in the proximal to mid segment.  At [facility] canal in the distal SFA and above-knee popliteal artery showed a very short focal occlusion with large collateral around this.  She had a patent below-knee popliteal artery with three-vessel runoff.  Anterior tibial appeared to be dominant runoff.   Ultimately this  lesion was crossed in the distal SFA/above knee popliteal artery and primarily stented with a 6 mm x 60 mm Eluvia.  She now has excellent inline flow down the left lower extremity with no residual stenosis and three-vessel runoff.  There was a focal perforation in the anterior tibial where our wire likely made a perforation during exchange of balloons and stents.  Ultimately this was treated with a very low inflation of a 3 mm Sterling for 3 minutes with resolution.  Patient has preserved three vessel runoff.              Procedure:  The patient was identified in the holding area and taken to room 8.  The patient was then placed supine on the table and prepped and draped in the usual sterile fashion.  A time out was called.  Ultrasound was used to evaluate the right common femoral artery.  It was patent .  A digital ultrasound image was acquired.  A micropuncture needle was used to access the right common femoral artery under ultrasound guidance.  An 018 wire was advanced without resistance and a micropuncture sheath was placed.  The 018 wire was removed and a benson wire was placed.  The micropuncture sheath was exchanged for a 5 french sheath.  An omniflush catheter was advanced over the wire to the level of L-1.  An abdominal angiogram was obtained.  Next, using glidewire and straight 4 French flush catheter the aortic bifurcation was cross because the Omni would not track.  Placed the catheter into  theleft external iliac artery and left runoff was obtained.  Ultimately after evaluating the pictures elected to intervene on the distal left SFA above-knee popliteal occlusion.  Used a long Glidewire advantage down the SFA and exchanged for a 6 [place] Ansell sheath in the right groin over the aortic bifurcation.  Patient was given 100 units per kilogram heparin.  I then primarily stented this lesion with a 6 mm x 60 mm Eluvia that was postdilated with a 5 mm x 60 mm Mustang.  Final pictures showed no residual stenosis with excellent inline flow down the left lower extremity and three-vessel runoff.  There was a focal perforation in the proximal anterior tibial that I think was from our wire.  I then exchanged for a V 18 wire down the anterior tibial and then did a very slow inflation with a 3 mm Sterling in the left anterior tibial artery for 3 minutes.  There appeared to be resolution of the perforation after taking multiple additional pictures.  Wires and catheters removed and a short 6 French sheath was placed in the right groin.  We placed a mynx closure device.   Plan: Patient will be loaded on Plavix and will need to continue aspirin and Plavix.  Should have inline flow down left leg now.   Marty Heck, MD [facility] Office: [phone]   DG Chest Port 1V same Day  Result Date: 01/24/2020 CLINICAL DATA:  COVID-19 positive.  Respiratory distress EXAM: PORTABLE CHEST 1 VIEW COMPARISON:  January 16, 2020 FINDINGS: There is extensive interstitial and airspace opacity throughout the lungs bilaterally with increase in consolidation in the bases compared to most recent study. There is cardiomegaly with pulmonary venous hypertension. No adenopathy. There is aortic atherosclerosis. Postoperative change noted in the lower cervical spine. IMPRESSION: Widespread interstitial and airspace opacity bilaterally. Cardiomegaly with pulmonary vascular congestion. Suspect a degree  of congestive heart failure with multifocal pneumonia superimposed.  Increased opacity in the lung bases compared to prior study. Aortic Atherosclerosis (ICD10-I70.0). Electronically Signed   By: Lowella Grip III M.D.   On: 01/24/2020 10:33   DG Chest Port 1V same Day  Result Date: 01/16/2020 CLINICAL DATA:  Shortness of breath.  COVID. EXAM: PORTABLE CHEST 1 VIEW COMPARISON:  Three days ago FINDINGS: More confluent bilateral interstitial and airspace opacity. Cardiomegaly. No visible effusion or pneumothorax. IMPRESSION: Worsening bilateral pneumonia. Electronically Signed   By: Monte Fantasia M.D.   On: 01/16/2020 08:34   DG Foot Complete Left  Result Date: 01/05/2020 CLINICAL DATA:  Swelling EXAM: LEFT FOOT - COMPLETE 3+ VIEW COMPARISON:  None. FINDINGS: Degenerative changes in the 1st MTP joint. No acute bony abnormality. Specifically, no fracture, subluxation, or dislocation. Vascular calcifications noted. No bone destruction seen to suggest osteomyelitis. IMPRESSION: No acute bony abnormality. Electronically Signed   By: Rolm Baptise M.D.   On: 01/05/2020 01:25   VAS [place] ABI WITH/WO TBI  Result Date: 01/05/2020 LOWER EXTREMITY DOPPLER STUDY Indications: Blue, cold toes. High Risk Factors: Hypertension, Diabetes.  Limitations: Today's exam was limited due to Patient body habitus. Comparison Study: No prior studies. Performing Technologist: Carlos Levering Rvt  Examination Guidelines: A complete evaluation includes at minimum, Doppler waveform signals and systolic blood pressure reading at the level of bilateral brachial, anterior tibial, and posterior tibial arteries, when vessel segments are accessible. Bilateral testing is considered an integral part of a complete examination. Photoelectric Plethysmograph (PPG) waveforms and toe systolic pressure readings are included as required and additional duplex testing as needed. Limited examinations for reoccurring indications may be performed as noted.   ABI Findings: +---------+------------------+-----+---------+--------+ Right    Rt Pressure (mmHg)IndexWaveform Comment  +---------+------------------+-----+---------+--------+ Brachial 150                    triphasic         +---------+------------------+-----+---------+--------+ PTA      108               0.72 biphasic          +---------+------------------+-----+---------+--------+ DP       137               0.91 triphasic         +---------+------------------+-----+---------+--------+ Great Toe76                0.51                   +---------+------------------+-----+---------+--------+ +---------+------------------+-----+----------+-------+ Left     Lt Pressure (mmHg)IndexWaveform  Comment +---------+------------------+-----+----------+-------+ Brachial 136                    triphasic         +---------+------------------+-----+----------+-------+ PTA      66                0.44 monophasic        +---------+------------------+-----+----------+-------+ DP       53                0.35 monophasic        +---------+------------------+-----+----------+-------+ Great Toe0                 0.00                   +---------+------------------+-----+----------+-------+ +-------+-----------+-----------+------------+------------+ ABI/TBIToday's ABIToday's TBIPrevious ABIPrevious TBI +-------+-----------+-----------+------------+------------+ Right  0.91       0.51                                +-------+-----------+-----------+------------+------------+  Left   0.44       0                                   +-------+-----------+-----------+------------+------------+  Summary: Right: Resting right ankle-brachial index indicates mild right lower extremity arterial disease. The right toe-brachial index is abnormal. Left: Resting left ankle-brachial index indicates severe left lower extremity arterial disease. The left toe-brachial index is  abnormal.  *See table(s) above for measurements and observations.  Electronically signed by [name] MD on 01/05/2020 at 7:35:40 PM.    Final    VAS [place] LOWER EXTREMITY ARTERIAL DUPLEX  Result Date: 01/05/2020 LOWER EXTREMITY ARTERIAL DUPLEX STUDY Indications: Blue, cold toes. High Risk Factors: Hypertension, Diabetes.  Current ABI: R 0.91 L 0.44 Limitations: Patient body habitus, poor ultrasound/tissue interface. Comparison Study: No prior studies. Performing Technologist: Oliver Hum RVT  Examination Guidelines: A complete evaluation includes B-mode imaging, spectral Doppler, color Doppler, and power Doppler as needed of all accessible portions of each vessel. Bilateral testing is considered an integral part of a complete examination. Limited examinations for reoccurring indications may be performed as noted.  +-----------+--------+-----+--------+----------+------------------+ LEFT       PSV cm/sRatioStenosisWaveform  Comments           +-----------+--------+-----+--------+----------+------------------+ EIA Distal 116                  biphasic                     +-----------+--------+-----+--------+----------+------------------+ CFA Mid    132                  biphasic                     +-----------+--------+-----+--------+----------+------------------+ DFA        111                  biphasic                     +-----------+--------+-----+--------+----------+------------------+ SFA Prox   104                  biphasic                     +-----------+--------+-----+--------+----------+------------------+ SFA Mid    85                   biphasic                     +-----------+--------+-----+--------+----------+------------------+ SFA Distal 91                   biphasic                     +-----------+--------+-----+--------+----------+------------------+ POP Prox   26                   monophasic                    +-----------+--------+-----+--------+----------+------------------+ POP Distal 0                              Unable to insonate +-----------+--------+-----+--------+----------+------------------+ ATA Prox   73  biphasic                     +-----------+--------+-----+--------+----------+------------------+ ATA Mid    33                   monophasic                   +-----------+--------+-----+--------+----------+------------------+ ATA Distal 32                   monophasic                   +-----------+--------+-----+--------+----------+------------------+ PTA Prox                                  Unable to insonate +-----------+--------+-----+--------+----------+------------------+ PTA Mid    27                   monophasic                   +-----------+--------+-----+--------+----------+------------------+ PTA Distal 9                    monophasic                   +-----------+--------+-----+--------+----------+------------------+ PERO Prox                                 Unable to insonate +-----------+--------+-----+--------+----------+------------------+ PERO Mid                                  Unable to insonate +-----------+--------+-----+--------+----------+------------------+ PERO Distal15                   monophasic                   +-----------+--------+-----+--------+----------+------------------+ DP         29                   monophasic                   +-----------+--------+-----+--------+----------+------------------+  Summary: Left: The external iliac, common femoral, and superficial femoral arteries appear patent with no significant stenosis. Monophasic waveforms are noted in the proximal popliteal artery with poor visualization of the distal segment. Which suggests a possible occlusion. Anterior tibial artery appears patent throughout with monophasic flow. The posterior tibial, and peroneal  arteries demonstrated monophasic flow with likely areas of occlusive disease. The proximal calf arteries were difficult to visualize.  See table(s) above for measurements and observations. Electronically signed by [name] MD on 01/05/2020 at 64:34:41 PM.    Final    VAS [place] LOWER EXTREMITY VENOUS (DVT)  Result Date: 01/13/2020  Lower Venous DVTStudy Indications: Swelling.  Limitations: Body habitus and poor ultrasound/tissue interface. Performing Technologist: Antonieta Pert RDMS, RVT  Examination Guidelines: A complete evaluation includes B-mode imaging, spectral Doppler, color Doppler, and power Doppler as needed of all accessible portions of each vessel. Bilateral testing is considered an integral part of a complete examination. Limited examinations for reoccurring indications may be performed as noted. The reflux portion of the exam is performed with the patient in reverse Trendelenburg.  +---------+---------------+---------+-----------+----------+-------------------+ RIGHT    CompressibilityPhasicitySpontaneityPropertiesThrombus Aging      +---------+---------------+---------+-----------+----------+-------------------+ CFV  Partial        Yes      Yes                  patient body                                                              habitus and                                                               position limiting                                                         evaluation          +---------+---------------+---------+-----------+----------+-------------------+ SFJ      Full                                                             +---------+---------------+---------+-----------+----------+-------------------+ FV Prox                          Yes                                      +---------+---------------+---------+-----------+----------+-------------------+ FV Mid                           Yes                                       +---------+---------------+---------+-----------+----------+-------------------+ FV Distal                        Yes                                      +---------+---------------+---------+-----------+----------+-------------------+ PFV      Full                                                             +---------+---------------+---------+-----------+----------+-------------------+ POP      Full                                                             +---------+---------------+---------+-----------+----------+-------------------+  PTV                                                   not clearly                                                               visualized          +---------+---------------+---------+-----------+----------+-------------------+ PERO                                                  not clearly                                                               visualized          +---------+---------------+---------+-----------+----------+-------------------+   +---------+---------------+---------+-----------+----------+------------------+ LEFT     CompressibilityPhasicitySpontaneityPropertiesThrombus Aging     +---------+---------------+---------+-----------+----------+------------------+ CFV      Full           Yes      Yes                                     +---------+---------------+---------+-----------+----------+------------------+ SFJ      Full                                                            +---------+---------------+---------+-----------+----------+------------------+ FV Prox  Full                                                            +---------+---------------+---------+-----------+----------+------------------+ FV Mid   Full                                                             +---------+---------------+---------+-----------+----------+------------------+ FV Distal                        Yes                                     +---------+---------------+---------+-----------+----------+------------------+ PFV      Full                                                            +---------+---------------+---------+-----------+----------+------------------+  POP      Full           Yes      Yes                                     +---------+---------------+---------+-----------+----------+------------------+ PTV                                                   not clearly                                                              visualized         +---------+---------------+---------+-----------+----------+------------------+ PERO                                                  not clearly                                                              visualized         +---------+---------------+---------+-----------+----------+------------------+    Summary: RIGHT: - There is no evidence of deep vein thrombosis in the lower extremity. However, portions of this examination were limited- see technologist comments above.  LEFT: - There is no evidence of deep vein thrombosis in the lower extremity. However, portions of this examination were limited- see technologist comments above.  - Prominent lymph nodes noted bilateral groin areas.  *See table(s) above for measurements and observations. Electronically signed by Deitra Mayo MD on 01/13/2020 at 6:50:27 PM.    Final    ECHOCARDIOGRAM LIMITED  Result Date: 01/15/2020    ECHOCARDIOGRAM LIMITED REPORT   Patient Name:   [name] Date of Exam: 01/15/2020 Medical Rec #:  [id number]     Height:       61.0 in Accession #:    [id number]    Weight:       249.3 lb Date of Birth:  Apr 06, 1956      BSA:          2.074 m Patient Age:    16 years      BP:           131/70 mmHg Patient Gender: F              HR:           53 bpm. Exam Location:  Inpatient Procedure: Limited Echo, Limited Color Doppler and Cardiac Doppler Indications:    Acute Respiratory Insufficiency R06.89  History:        Patient has prior history of Echocardiogram examinations, most                 recent 12/02/2016. Risk Factors:Hypertension and Dyslipidemia.  Sonographer:    Myriam Jacobson  Leonel Ramsay RDCS (AE) Referring Phys: Coy  1. Left ventricular diastolic parameters are consistent with Grade II diastolic dysfunction (pseudonormalization).  2. Right ventricular systolic function is moderately reduced. There is severely elevated pulmonary artery systolic pressure.  3. The inferior vena cava is normal in size with greater than 50% respiratory variability, suggesting right atrial pressure of 3 mmHg. FINDINGS  Left Ventricle: Indeterminate filling pressures. Right Ventricle: Right ventricular systolic function is moderately reduced. There is severely elevated pulmonary artery systolic pressure. The tricuspid regurgitant velocity is 3.86 m/s, and with an assumed right atrial pressure of 3 mmHg, the estimated right ventricular systolic pressure is [id number] mmHg. Tricuspid Valve: Tricuspid valve regurgitation is mild. Venous: The inferior vena cava is normal in size with greater than 50% respiratory variability, suggesting right atrial pressure of 3 mmHg.  LEFT VENTRICLE PLAX 2D LVOT diam:     2.10 cm  Diastology LV SV:         81       LV e' lateral:   5.61 cm/s LV SV Index:   39       LV E/e' lateral: 12.1 LVOT Area:     3.46 cm LV e' medial:    6.40 cm/s                         LV E/e' medial:  10.6  RIGHT VENTRICLE TAPSE (M-mode): 1.5 cm LEFT ATRIUM           Index       RIGHT ATRIUM           Index LA Vol (A4C): 37.4 ml 18.03 ml/m RA Area:     16.80 cm                                   RA Volume:   44.50 ml  21.45 ml/m  AORTIC VALVE LVOT Vmax:   98.60 cm/s LVOT Vmean:  69.800 cm/s LVOT VTI:    0.235 m MITRAL  VALVE               TRICUSPID VALVE MV Area (PHT): 3.53 cm    TR Peak grad:   59.6 mmHg MV Decel Time: 215 msec    TR Vmax:        386.00 cm/s MV E velocity: 67.70 cm/s MV A velocity: 65.10 cm/s  SHUNTS MV E/A ratio:  1.04        Systemic VTI:  0.24 m                            Systemic Diam: 2.10 cm Skeet Latch MD Electronically signed by Skeet Latch MD Signature Date/Time: 01/15/2020/3:50:03 PM    Final

## 2020-01-26 ENCOUNTER — Inpatient Hospital Stay (HOSPITAL_COMMUNITY): Payer: BC Managed Care – PPO

## 2020-01-26 LAB — BLOOD GAS, ARTERIAL
Acid-Base Excess: 4.6 mmol/L — ABNORMAL HIGH (ref 0.0–2.0)
Bicarbonate: 28.3 mmol/L — ABNORMAL HIGH (ref 20.0–28.0)
Drawn by: 227661
FIO2: 80
O2 Saturation: 92.3 %
Patient temperature: 37
pCO2 arterial: 39.9 mmHg (ref 32.0–48.0)
pH, Arterial: 7.464 — ABNORMAL HIGH (ref 7.350–7.450)
pO2, Arterial: 65.6 mmHg — ABNORMAL LOW (ref 83.0–108.0)

## 2020-01-26 LAB — COMPREHENSIVE METABOLIC PANEL
ALT: 52 U/L — ABNORMAL HIGH (ref 0–44)
AST: 58 U/L — ABNORMAL HIGH (ref 15–41)
Albumin: 2.5 g/dL — ABNORMAL LOW (ref 3.5–5.0)
Alkaline Phosphatase: 79 U/L (ref 38–126)
Anion gap: 10 (ref 5–15)
BUN: 52 mg/dL — ABNORMAL HIGH (ref 8–23)
CO2: 27 mmol/L (ref 22–32)
Calcium: 8.3 mg/dL — ABNORMAL LOW (ref 8.9–10.3)
Chloride: 105 mmol/L (ref 98–111)
Creatinine, Ser: 1.73 mg/dL — ABNORMAL HIGH (ref 0.44–1.00)
GFR calc Af Amer: 36 mL/min — ABNORMAL LOW (ref >60)
GFR calc non Af Amer: 31 mL/min — ABNORMAL LOW (ref >60)
Glucose, Bld: 63 mg/dL — ABNORMAL LOW (ref 70–99)
Potassium: 4 mmol/L (ref 3.5–5.1)
Sodium: 142 mmol/L (ref 135–145)
Total Bilirubin: 1.1 mg/dL (ref 0.3–1.2)
Total Protein: 5.1 g/dL — ABNORMAL LOW (ref 6.5–8.1)

## 2020-01-26 LAB — CBC WITH DIFFERENTIAL/PLATELET
Abs Immature Granulocytes: 0.72 10*3/uL — ABNORMAL HIGH (ref 0.00–0.07)
Basophils Absolute: 0.1 10*3/uL (ref 0.0–0.1)
Basophils Relative: 0 %
Eosinophils Absolute: 0.4 10*3/uL (ref 0.0–0.5)
Eosinophils Relative: 2 %
HCT: 33 % — ABNORMAL LOW (ref 36.0–46.0)
Hemoglobin: 10.6 g/dL — ABNORMAL LOW (ref 12.0–15.0)
Immature Granulocytes: 3 %
Lymphocytes Relative: 6 %
Lymphs Abs: 1.7 10*3/uL (ref 0.7–4.0)
MCH: 31.3 pg (ref 26.0–34.0)
MCHC: 32.1 g/dL (ref 30.0–36.0)
MCV: 97.3 fL (ref 80.0–100.0)
Monocytes Absolute: 1.6 10*3/uL — ABNORMAL HIGH (ref 0.1–1.0)
Monocytes Relative: 6 %
Neutro Abs: 22.6 10*3/uL — ABNORMAL HIGH (ref 1.7–7.7)
Neutrophils Relative %: 83 %
Platelets: 186 10*3/uL (ref 150–400)
RBC: 3.39 MIL/uL — ABNORMAL LOW (ref 3.87–5.11)
RDW: 20.5 % — ABNORMAL HIGH (ref 11.5–15.5)
WBC: 27.1 10*3/uL — ABNORMAL HIGH (ref 4.0–10.5)
nRBC: 2.7 % — ABNORMAL HIGH (ref 0.0–0.2)

## 2020-01-26 LAB — GLUCOSE, CAPILLARY
Glucose-Capillary: 47 mg/dL — ABNORMAL LOW (ref 70–99)
Glucose-Capillary: 201 mg/dL — ABNORMAL HIGH (ref 70–99)
Glucose-Capillary: 130 mg/dL — ABNORMAL HIGH (ref 70–99)
Glucose-Capillary: 147 mg/dL — ABNORMAL HIGH (ref 70–99)
Glucose-Capillary: 175 mg/dL — ABNORMAL HIGH (ref 70–99)

## 2020-01-26 LAB — MAGNESIUM: Magnesium: 2.3 mg/dL (ref 1.7–2.4)

## 2020-01-26 LAB — C-REACTIVE PROTEIN: CRP: 0.6 mg/dL (ref <1.0)

## 2020-01-26 LAB — D-DIMER, QUANTITATIVE: D-Dimer, Quant: 6.92 ug/mL-FEU — ABNORMAL HIGH (ref 0.00–0.50)

## 2020-01-26 LAB — BRAIN NATRIURETIC PEPTIDE: B Natriuretic Peptide: 222.5 pg/mL — ABNORMAL HIGH (ref 0.0–100.0)

## 2020-01-26 MED ORDER — METOLAZONE 5 MG PO TABS
5.0000 mg | ORAL_TABLET | Freq: Once | ORAL | Status: AC
  Administered 2020-01-26: 5 mg via ORAL
  Filled 2020-01-26: qty 1

## 2020-01-26 MED ORDER — COLCHICINE 0.6 MG PO TABS
0.6000 mg | ORAL_TABLET | Freq: Every day | ORAL | Status: DC
  Administered 2020-01-26 – 2020-01-29 (×4): 0.6 mg via ORAL
  Filled 2020-01-26 (×5): qty 1

## 2020-01-26 MED ORDER — DEXTROSE 50 % IV SOLN
INTRAVENOUS | Status: AC
  Administered 2020-01-26: 50 mL via INTRAVENOUS
  Filled 2020-01-26: qty 50

## 2020-01-26 MED ORDER — FUROSEMIDE 10 MG/ML IJ SOLN
60.0000 mg | Freq: Once | INTRAMUSCULAR | Status: AC
  Administered 2020-01-26: 60 mg via INTRAVENOUS
  Filled 2020-01-26: qty 6

## 2020-01-26 MED ORDER — ENOXAPARIN SODIUM 40 MG/0.4ML ~~LOC~~ SOLN
40.0000 mg | Freq: Two times a day (BID) | SUBCUTANEOUS | Status: DC
  Administered 2020-01-26 – 2020-01-27 (×2): 40 mg via SUBCUTANEOUS
  Filled 2020-01-26 (×2): qty 0.4

## 2020-01-26 MED ORDER — METOLAZONE 2.5 MG PO TABS
2.5000 mg | ORAL_TABLET | Freq: Once | ORAL | Status: DC
  Filled 2020-01-26: qty 1

## 2020-01-26 MED ORDER — DOXAZOSIN MESYLATE 2 MG PO TABS
2.0000 mg | ORAL_TABLET | Freq: Every day | ORAL | Status: DC
  Filled 2020-01-26: qty 1

## 2020-01-26 MED ORDER — INSULIN GLARGINE 100 UNIT/ML ~~LOC~~ SOLN
10.0000 [IU] | Freq: Every day | SUBCUTANEOUS | Status: DC
  Filled 2020-01-26 (×2): qty 0.1

## 2020-01-26 MED ORDER — FUROSEMIDE 10 MG/ML IJ SOLN
40.0000 mg | Freq: Once | INTRAMUSCULAR | Status: AC
  Administered 2020-01-26: 40 mg via INTRAVENOUS
  Filled 2020-01-26: qty 4

## 2020-01-26 NOTE — Progress Notes (Signed)
Physical Therapy Treatment Patient Details Name: [name] MRN: [id number] DOB: [birth year] Today's Date: 01/26/2020    History of Present Illness 64 year old female admitted 12/25/2019 with SOB and cough. Patient with acute hypoxic respiratory failure due to COVID PNA and decompensatd diastolic heart failure. Of note, patient with recent hospitalization 4/18-4/23/21 for stent to L distal SFA. This admission, steroids and Remdesivir started 01/13/20. Patient with elevated troponin likely secondary to underlying CKD, not consistent with ACS. PMH: hypertension, HLD, DM-2, CKD stage IV, PVD s/p stent to left distal SFA on [id number], chronic diastolic heart failure    PT Comments    Pt with impaired respiratory status vs last visit this day, pt on 15LO2 and NRB upon PT arrival to room. Pt tolerated limited EOB activity and standing with lateral stepping, pt requiring significant rest breaks due to SpO2 desaturation and significant tachypnea. Pt with sores along buttocks which are providing pt with significant discomfort, RN aware. PT to continue to follow acutely, will progress mobility as tolerated.   SpO2 on 15L NRB: 83-96% RRmax: 50    Follow Up Recommendations  SNF;Supervision/Assistance - 24 hour     Equipment Recommendations  Rolling walker with 5" wheels;Wheelchair cushion (measurements PT);3in1 (PT);Wheelchair (measurements PT)    Recommendations for Other Services       Precautions / Restrictions Precautions Precautions: Fall Precaution Comments: on 15LO2, NRB Restrictions Weight Bearing Restrictions: No    Mobility  Bed Mobility Overal bed mobility: Needs Assistance Bed Mobility: Rolling;Sidelying to Sit;Sit to Sidelying Rolling: Mod assist;+2 for safety/equipment Sidelying to sit: Mod assist;HOB elevated;+2 for safety/equipment     Sit to sidelying: Max assist;+2 for physical assistance;+2 for safety/equipment;HOB elevated General bed mobility comments: Mod-max +2 for  rolling for truncal and LE translation, and sidelying<>sit for trunk/LE maangement, scooting to and from EOB. SpO2 83-88% with transitioning from supine<>sit requiring verbal cuing for breathing technique and reast break x2 minutes to recover sats 90% and greater.  Transfers Overall transfer level: Needs assistance Equipment used: 2 person hand held assist Transfers: Sit to/from Stand Sit to Stand: Mod assist;+2 physical assistance;+2 safety/equipment;From elevated surface         General transfer comment: Mod assist +2 for power up, steadying, maintaining upright as pt with preference for return to sit (required mod verbal encouragement). Pt able to take 2 steps towards [facility] with mod assist for steadying, before fatiguing and returning to sit.  Ambulation/Gait             General Gait Details: unable this day due to respiratory status, fatigue   Stairs             Wheelchair Mobility    Modified Rankin (Stroke Patients Only)       Balance Overall balance assessment: Needs assistance Sitting-balance support: Feet supported Sitting balance-Leahy Scale: Fair Sitting balance - Comments: sitting EOB approx 5 minutes   Standing balance support: Bilateral upper extremity supported Standing balance-Leahy Scale: Poor Standing balance comment: posterior lean initially upon standing                            Cognition Arousal/Alertness: Awake/alert Behavior During Therapy: Flat affect Overall Cognitive Status: No family/caregiver present to determine baseline cognitive functioning Area of Impairment: Attention;Safety/judgement;Problem solving                   Current Attention Level: Sustained     Safety/Judgement: Decreased awareness of safety;Decreased awareness of deficits  Problem Solving: Decreased initiation;Slow processing;Difficulty sequencing;Requires tactile cues;Requires verbal cues General Comments: Pt with flat affect and at times  appears to "zone out" during therapy, states multiple times throughout session "what do you want me to do again?" with PT verbal cuing for mobility.      Exercises General Exercises - Lower Extremity Long Arc Quad: AROM;Both;10 reps;Seated    General Comments General comments (skin integrity, edema, etc.): SpO2 83-96% on 15LO2 with NRB, RRmax 50      Pertinent Vitals/Pain Pain Assessment: No/denies pain Faces Pain Scale: Hurts even more Pain Location: buttocks (sores) Pain Intervention(s): Limited activity within patient's tolerance;Monitored during session    Home Living                      Prior Function            PT Goals (current goals can now be found in the care plan section) Progress towards PT goals: Not progressing toward goals - comment(respiratory status decline)    Frequency    Min 2X/week      PT Plan Current plan remains appropriate    Co-evaluation              AM-PAC PT "6 Clicks" Mobility   Outcome Measure  Help needed turning from your back to your side while in a flat bed without using bedrails?: A Lot Help needed moving from lying on your back to sitting on the side of a flat bed without using bedrails?: A Lot Help needed moving to and from a bed to a chair (including a wheelchair)?: A Lot Help needed standing up from a chair using your arms (e.g., wheelchair or bedside chair)?: A Lot Help needed to walk in hospital room?: Total Help needed climbing 3-5 steps with a railing? : Total 6 Click Score: 10    End of Session Equipment Utilized During Treatment: Gait belt Activity Tolerance: Patient limited by fatigue;Other (comment)(respiratory status) Patient left: in chair;with call bell/phone within reach;with chair alarm set Nurse Communication: Mobility status PT Visit Diagnosis: Other abnormalities of gait and mobility (R26.89);Muscle weakness (generalized) (M62.81);Pain     Time: 1055-1110 PT Time Calculation (min) (ACUTE  ONLY): 15 min  Charges:  $Therapeutic Activity: 8-22 mins                     Janard Culp E, PT Acute Rehabilitation Services Pager [phone]  Office [phone]   Keerat Denicola D Elonda Husky 01/26/2020, 12:38 PM

## 2020-01-26 NOTE — Progress Notes (Signed)
Called to room by patient. O2 sat on monitor was 75% and patient c/o difficulty breathing. 02 turned up from 5L/min to 15L/min and RT was called. RT to room and encouraged patient to wear Cpap which is at bedside. Patient refused. Placed on non-rebreather mask at 15L. O2 sat 100% and patient resting comfortably.

## 2020-01-26 NOTE — Progress Notes (Signed)
RT called to see pt d/t desat on salter at 5-6LPM.  RN had increased flow to 15LPM on  RT arrival and sats were 96-98% when I walked in but slowly returned to mid to upper 80's.  RT placed NRB on pt and sat went to 100%.  RT then turned salter off.  Pt remained 100% on NRB only.  RT spoke with RN about titration as pt tolerates d/t hx of COPD.  RT saw CPAP in room but pt states she can't wear it because it 'doesn't feel good' and because she believes she 'needs another sleep study.'  RT spoke to pt about need for CPAP d/t OSA and now with covid and the complications of low saturations and how it worsens with her sleeping d/t OSA.  Pt declines use of CPAP.  RT left pt on NRB and asked RN to remove in 15-30 mins as pt tolerates and place pt back on the salter at 15LPM or less (NRB to remain at 15lpm at all times/DO NOT TITRATE NRB).  RT asked RN to contact again if more problems occur.

## 2020-01-26 NOTE — Progress Notes (Signed)
PROGRESS NOTE                                                                                                                                                                                                             Patient Demographics:    [name], is a 64 y.o. female, DOB - Nov 06, 1955, [id number]  Outpatient Primary MD for the patient is Pavelock, Ralene Bathe, MD   Admit date - 01/01/2020   LOS - 54  Chief Complaint  Patient presents with  . Shortness of Breath  . Chest Pain       Brief Narrative: Patient is a 64 y.o. female  PMHx of hypertension, HLD, DM-2, CKD stage IV, PVD s/p stent to left distal SFA on [id number], chronic diastolic heart failure-presented with shortness of breath or cough-found to have acute hypoxic respiratory failure secondary to COVID-19 pneumonia and decompensated diastolic heart failure.  Significant Events: 4/18-4/23>> admit to [facility]-s/p stent to left distal SFA 4/27>> admit to [facility] for hypoxia from Covid/heart failure 4/27>> lower extremity Dopplers negative for DVT. [id number] grade 2 diastolic dysfunction, moderate RV systolic dysfunction.  RVSP 62.6 mmHg. 4/30>> VQ scan-low probability PE 5/5>> improved-on room air 5/8>> worsening hypoxemia-on 15 L of oxygen-with dramatic response to Lasix-down to 4 L  COVID-19 medications: Steroids: 4/27>>5/8 (refused on 5/6) Remdesivir: 4/27>> 5/1  Antibiotics: Vancomycin: 4/27 x 1 Cefepime: 4/27 x 1  Microbiology data: 4/27: Blood culture>> no growth 4/28: Blood culture>> no growth  DVT prophylaxis: SQ heparin  Procedures: None  Consults: Renal    Subjective:   Patient in bed, appears comfortable, denies any headache, no fever, no chest pain or pressure, ++ shortness of breath , no abdominal pain. No focal weakness.   Assessment  & Plan :   Acute Hypoxic Resp Failure due to Covid 19 Viral pneumonia and decompensated diastolic  heart failure EF 50% : Had improved and was on room air for the past several days-developed pulmonary edema overnight on 01/24/2020,and severely Hypoxic again, continue IV lasix, encourage to sit in chair and use IS and FV for pulmonary toiletry.  Currently on 15 L oxygen plus nonrebreather mask.   TTE   1. Left ventricular diastolic parameters are consistent with Grade II diastolic dysfunction (pseudonormalization). EF 50%. 2. Right ventricular systolic function is moderately reduced. There is severely elevated pulmonary artery  systolic pressure.  3. The inferior vena cava is normal in size with greater than 50% respiratory variability, suggesting right atrial pressure of 3 mmHg.    COVID-19 pneumonia: He had severe disease and has completed her course of remdesivir, also received Actemra.  Steroids were not used regularly as patient tended to refuse it from time to time.    Recent Labs  Lab 01/20/20 0547 01/20/20 0547 01/21/20 1115 01/21/20 1115 01/22/20 0739 01/22/20 1042 01/23/20 1012 01/24/20 0812 01/25/20 0800 01/26/20 0353  NA 141   < > 139   < >  --  139 139 140 141 142  K 5.3*   < > 4.1   < >  --  4.1 5.0 4.2 3.7 4.0  CL 107   < > 103   < >  --  103 104 102 105 105  CO2 20*   < > 24   < >  --  22 19* 24 25 27   GLUCOSE 155*   < > 132*   < >  --  121* 136* 71 69* 63*  BUN 96*   < > 27*   < >  --  84* 72* 70* 57* 52*  CREATININE 2.34*   < > 0.95   < >  --  2.10* 1.84* 1.98* 1.74* 1.73*  CALCIUM 8.7*   < > 8.0*   < >  --  8.4* 8.1* 8.4* 8.4* 8.3*  AST 59*   < > 105*  --   --  51* 69*  --  64* 58*  ALT 36   < > 57*  --   --  38 36  --  53* 52*  ALKPHOS 67   < > 64  --   --  74 75  --  71 79  BILITOT 1.0   < > 0.8  --   --  1.4* 1.2  --  1.1 1.1  ALBUMIN 2.7*   < > 2.6*  --   --  2.7* 2.6*  --  2.7* 2.5*  MG  --   --   --   --   --   --   --   --  2.4 2.3  CRP 2.5*   < > 5.7*  --  1.3*  --   --  0.7 0.7 0.6  DDIMER 4.79*  --   --   --  5.04*  --   --  5.22* 5.94* 6.92*    PROCALCITON  --   --   --   --   --   --   --  0.21  --   --   BNP  --   --   --   --   --   --   --  130.9* 215.0* 222.5*   < > = values in this interval not displayed.    Recent Labs  Lab 01/20/20 0547 01/20/20 0547 01/21/20 1115 01/22/20 0739 01/24/20 0812 01/25/20 0800 01/26/20 0353  CRP 2.5*   < > 5.7* 1.3* 0.7 0.7 0.6  DDIMER 4.79*  --   --  5.04* 5.22* 5.94* 6.92*  BNP  --   --   --   --  130.9* 215.0* 222.5*  PROCALCITON  --   --   --   --  0.21  --   --    < > = values in this interval not displayed.       Acute on Chronic Diastolic heart failure EF  50% : Had improved with IV diuretics + Zaroxolyn on 01/26/20 - on high dose diuretics at home, weight is up 2 EKGs in a day, is also more short of breath with chest x-ray showing pulmonary edema, will diurese aggressively on 01/26/2020 and monitor.  Continue oxygen supplementation.  Filed Weights   01/24/20 0432 01/25/20 0500 01/26/20 0448  Weight: 109.8 kg 106.4 kg 109.7 kg    Pulmonary hypertension/RV dysfunction: Not seen in prior echo's-I suspect this is probably from OSA-she has been noncompliant to CPAP, she was counseled and continues to refuse it even here.  VQ scan low probability.  Will require outpatient pulmonary follow-up.  Oxygen here as needed.  AKI on CKD stage IV: Improving and back to baseline-AKI likely hemodynamically mediated.  Renal ultrasound without any hydronephrosis.    Acute metabolic encephalopathy: Was due to hypoxia now resolved.   Elevated troponin: Trend is flat-not consistent with ACS-likely secondary to underlying CKD-with some component from demand ischemia due to COVID-19/heart failure.  PAD-s/p stent to left SFA on 4/22: Both lower extremities appear warm-continue aspirin, Plavix and statin.  HTN: BP stable, hold off Cardura as she is being diuresed and monitor.  Thrombocytopenia: Resolved.  Leukocytosis: Secondary to intermittent use of steroids, afebrile, procalcitonin  stable.  Anemia of chronic disease: Close to baseline-likely related to CKD.  Gout: No flare-hold colchicine for now given AKI.  OSA: Noncompliant to CPAP-continue CPAP qhs if patient cooperates.  Counseled again on 01/26/2020 including her daughter.  Obesity: BMI of 44 follow with PCP.  DM-2 with hyperglycemia and hyperglycemia (A1c 12.2 on 4/19): Poor outpatient control due to hyperglycemia, is having episodes of hypoglycemia in the morning hence Lantus dose reduced on 01/25/2020, continue sliding scale, will get insulin and diabetic education as well.  Recent Labs    01/24/20 2142 01/25/20 0810 01/25/20 0838  GLUCAP 193* 68* 101*   Lab Results  Component Value Date   HGBA1C 12.2 (H) 01/05/2020     Condition - Guarded  Family Communication  : Daughter Juluis Rainier [phone] on 01/25/2020 at 10:45 AM - voicemail, 01/26/20  Code Status :  Full Code  Diet :  Diet Order            Diet heart healthy/carb modified Room service appropriate? Yes; Fluid consistency: Thin; Fluid restriction: 2000 mL Fluid  Diet effective now               Disposition Plan  :   Status is: Inpatient  Remains inpatient appropriate because:Inpatient level of care appropriate due to severity of illness  Dispo: The patient is from: Home              Anticipated d/c is to: Home              Anticipated d/c date is: >2 days              Patient currently is NOT medically stable to d/c.  Barriers to discharge: Worsening hypoxemia secondary to decompensated heart failure needing IV Lasix  Antimicorbials  :    Anti-infectives (From admission, onward)   Start     Dose/Rate Route Frequency Ordered Stop   01/15/20 1200  vancomycin (VANCOREADY) IVPB 1250 mg/250 mL  Status:  Discontinued     1,250 mg 166.7 mL/hr over 90 Minutes Intravenous Every 48 hours 01/13/20 1018 01/14/20 1038   01/14/20 1000  remdesivir 100 mg in sodium chloride 0.9 % 100 mL IVPB     100 mg 200 mL/hr over  30 Minutes Intravenous  Daily 01/13/20 1129 01/17/20 1113   01/13/20 1630  valACYclovir (VALTREX) tablet 1,000 mg  Status:  Discontinued     1,000 mg Oral 3 times daily 01/13/20 1557 01/17/20 1147   01/13/20 1230  remdesivir 200 mg in sodium chloride 0.9% 250 mL IVPB     200 mg 580 mL/hr over 30 Minutes Intravenous Once 01/13/20 1129 01/13/20 1800   01/13/20 1015  ceFEPIme (MAXIPIME) 2 g in sodium chloride 0.9 % 100 mL IVPB     2 g 200 mL/hr over 30 Minutes Intravenous  Once 01/13/20 1003 01/13/20 1434   01/13/20 1015  vancomycin (VANCOREADY) IVPB 2000 mg/400 mL     2,000 mg 200 mL/hr over 120 Minutes Intravenous  Once 01/13/20 1012 01/13/20 2258      Inpatient Medications  Scheduled Meds: . vitamin C  500 mg Oral Daily  . aspirin EC  81 mg Oral Q breakfast  . atorvastatin  40 mg Oral Daily  . brimonidine  1 drop Both Eyes BID  . clopidogrel  75 mg Oral Daily  . dorzolamide-timolol  1 drop Both Eyes BID  . enoxaparin (LOVENOX) injection  40 mg Subcutaneous Q12H  . furosemide  60 mg Intravenous Once  . insulin aspart  0-5 Units Subcutaneous QHS  . insulin aspart  0-9 Units Subcutaneous TID WC  . insulin glargine  10 Units Subcutaneous QHS  . Ipratropium-Albuterol  1 puff Inhalation Q6H  . latanoprost  1 drop Both Eyes QHS  . linagliptin  5 mg Oral Daily  . pantoprazole  40 mg Oral Q1200  . prednisoLONE acetate  1 drop Right Eye BID  . sodium bicarbonate  1,300 mg Oral BID  . sodium chloride flush  3 mL Intravenous Once  . zinc sulfate  220 mg Oral Daily   Continuous Infusions:  PRN Meds:.acetaminophen, ALPRAZolam, chlorpheniramine-HYDROcodone, guaiFENesin-dextromethorphan, [DISCONTINUED] ondansetron **OR** ondansetron (ZOFRAN) IV, oxyCODONE-acetaminophen   Time Spent in minutes  25  See all Orders from today for further details   Lala Lund M.D on 01/26/2020 at 2:37 PM  To page go to [email or web address] - use universal password  [facility] -  Office  [phone]    Objective:    Vitals:   01/26/20 0450 01/26/20 0844 01/26/20 1100 01/26/20 1300  BP: (!) 129/48 (!) 129/52  (!) 127/97  Pulse: 67 70    Resp: 20 (!) 30    Temp: (!) 97.5 F (36.4 C) 98.6 F (37 C)  97.8 F (36.6 C)  TempSrc: Axillary Axillary  Oral  SpO2: 100% 95% 95% (!) 84%  Weight:      Height:        Wt Readings from Last 3 Encounters:  01/26/20 109.7 kg  01/08/20 115.6 kg  07/15/19 102.9 kg     Intake/Output Summary (Last 24 hours) at 01/26/2020 1437 Last data filed at 01/26/2020 0900 Gross per 24 hour  Intake 354 ml  Output --  Net 354 ml    Physical Exam  Awake Alert, No new F.N deficits, Normal affect [place].AT,PERRAL Supple Neck,No JVD, No cervical lymphadenopathy appriciated.  Symmetrical Chest wall movement, Good air movement bilaterally, few rales RRR,No Gallops, Rubs or new Murmurs, No Parasternal Heave +ve B.Sounds, Abd Soft, No tenderness, No organomegaly appriciated, No rebound - guarding or rigidity. No Cyanosis, Clubbing or edema, No new Rash or bruise    Data Review:    CBC Recent Labs  Lab 01/22/20 1042 01/23/20 0721 01/24/20 [id number] 01/25/20 0800 01/26/20 0353  WBC 34.2* 39.5* 38.5* 31.6* 27.1*  HGB 12.1 12.7 10.7* 10.6* 10.6*  HCT 36.2 38.9 33.3* 32.9* 33.0*  PLT 315 297 244 202 186  MCV 91.6 95.1 95.7 96.2 97.3  MCH 30.6 31.1 30.7 31.0 31.3  MCHC 33.4 32.6 32.1 32.2 32.1  RDW 18.1* 19.1* 18.9* 19.4* 20.5*  LYMPHSABS  --   --  1.7  --  1.7  MONOABS  --   --  2.0*  --  1.6*  EOSABS  --   --  0.2  --  0.4  BASOSABS  --   --  0.1  --  0.1    Chemistries  Recent Labs  Lab 01/21/20 1115 01/21/20 1115 01/22/20 1042 01/23/20 1012 01/24/20 0812 01/25/20 0800 01/26/20 0353  NA 139   < > 139 139 140 141 142  K 4.1   < > 4.1 5.0 4.2 3.7 4.0  CL 103   < > 103 104 102 105 105  CO2 24   < > 22 19* 24 25 27   GLUCOSE 132*   < > 121* 136* 71 69* 63*  BUN 27*   < > 84* 72* 70* 57* 52*  CREATININE 0.95   < > 2.10* 1.84* 1.98* 1.74* 1.73*  CALCIUM  8.0*   < > 8.4* 8.1* 8.4* 8.4* 8.3*  MG  --   --   --   --   --  2.4 2.3  AST 105*  --  51* 69*  --  64* 58*  ALT 57*  --  38 36  --  53* 52*  ALKPHOS 64  --  74 75  --  71 79  BILITOT 0.8  --  1.4* 1.2  --  1.1 1.1   < > = values in this interval not displayed.   ------------------------------------------------------------------------------------------------------------------ No results for input(s): CHOL, HDL, LDLCALC, TRIG, CHOLHDL, LDLDIRECT in the last 72 hours.  Lab Results  Component Value Date   HGBA1C 12.2 (H) 01/05/2020   ------------------------------------------------------------------------------------------------------------------ No results for input(s): TSH, T4TOTAL, T3FREE, THYROIDAB in the last 72 hours.  Invalid input(s): FREET3 ------------------------------------------------------------------------------------------------------------------ No results for input(s): VITAMINB12, FOLATE, FERRITIN, TIBC, IRON, RETICCTPCT in the last 72 hours.  Coagulation profile No results for input(s): INR, PROTIME in the last 168 hours.  Recent Labs    01/25/20 0800 01/26/20 0353  DDIMER 5.94* 6.92*    Cardiac Enzymes No results for input(s): CKMB, TROPONINI, MYOGLOBIN in the last 168 hours.  Invalid input(s): CK ------------------------------------------------------------------------------------------------------------------    Component Value Date/Time   BNP 222.5 (H) 01/26/2020 [id number]    Micro Results No results found for this or any previous visit (from the past 240 hour(s)).  Radiology Reports DG Chest 2 View  Result Date: 01/13/2020 CLINICAL DATA:  Shortness of breath and bilateral leg swelling. EXAM: CHEST - 2 VIEW COMPARISON:  November 30, 2016 FINDINGS: Moderate severity diffuse multifocal infiltrates are seen, bilaterally. There is no evidence of a pleural effusion or pneumothorax. The cardiac silhouette is mildly enlarged. A radiopaque fusion plate and screws  are seen overlying the lower cervical spine. The visualized skeletal structures are otherwise unremarkable. Radiopaque surgical clips are seen within the right upper quadrant. IMPRESSION: Moderate severity bilateral multifocal infiltrates. Electronically Signed   By: Virgina Norfolk M.D.   On: 01/13/2020 00:27   NM Pulmonary Perfusion  Result Date: 01/16/2020 CLINICAL DATA:  Shortness of breath, history of COVID-19 positivity EXAM: NUCLEAR MEDICINE PERFUSION LUNG SCAN TECHNIQUE: Perfusion images were obtained in multiple projections after intravenous  injection of radiopharmaceutical. Ventilation scans intentionally deferred if perfusion scan and chest x-ray adequate for interpretation during COVID 19 epidemic. RADIOPHARMACEUTICALS:  1.6 mCi Tc-51m MAA IV COMPARISON:  Chest x-ray from earlier in the same day. FINDINGS: Perfusion images demonstrate adequate uptake throughout both lungs despite the changes seen on recent chest x-ray. No focal defect to suggest pulmonary embolism is noted. IMPRESSION: No evidence of pulmonary embolism. Electronically Signed   By: Inez Catalina M.D.   On: 01/16/2020 16:23   US RENAL  Result Date: 01/16/2020 CLINICAL DATA:  Acute renal insufficiency. EXAM: RENAL / URINARY TRACT ULTRASOUND COMPLETE COMPARISON:  None. FINDINGS: Exam difficult due to patient body habitus. Right Kidney: Renal measurements: 9.4 x 4.2 x 4.5 cm = volume: 94 mL . Echogenicity within normal limits. Mild cortical thinning. No mass or hydronephrosis visualized. Left Kidney: Renal measurements: 10.1 x 5.2 x 5.5 cm = volume: 150 mL. Echogenicity within normal limits. Mild cortical thinning. No mass or hydronephrosis visualized. Bladder: Not visualized. Other: None. IMPRESSION: Normal size kidneys without hydronephrosis. Electronically Signed   By: Marin Olp M.D.   On: 01/16/2020 16:38   US RENAL  Result Date: 01/06/2020 CLINICAL DATA:  Chronic kidney disease EXAM: RENAL / URINARY TRACT ULTRASOUND  COMPLETE COMPARISON:  None. FINDINGS: Right Kidney: Renal measurements: 9.3 x 4.3 x 4.6 cm = volume: 96 mL. Lobulated contour. No hydronephrosis. Normal echogenicity. Left Kidney: Renal measurements: 10.7 x 5.0 x 5.2 cm = volume: 145 mL. Lobulated contour without hydronephrosis. Normal echogenicity. Bladder: Decompressed. Other: Visualization is generally limited by body habitus. IMPRESSION: No hydronephrosis. Lobulated renal contours without focal parenchymal abnormality. Electronically Signed   By: Ulyses Jarred M.D.   On: 01/06/2020 16:16   PERIPHERAL VASCULAR CATHETERIZATION  Result Date: 01/08/2020 Patient name: Meleane Deoliveira   MRN: [id number]        DOB: August 28, 1956            Sex: female  01/08/2020 Pre-operative Diagnosis: Critical limb ischemia of the left lower extremity with tissue loss Post-operative diagnosis:  Same Surgeon:  Marty Heck, MD Procedure Performed: 1.  Ultrasound guided access of the right common femoral artery 2.  Aortogram with CO2 including catheter selection of the aorta 3.  Left lower extremity arteriogram with selection of third order branches 4.  Left distal SFA above-knee popliteal artery angioplasty with stent placement (primarily stented with a 6 mm x 60 mm drug-coated Eluvia postdilated with a 5 mm Mustang) 5.  Left anterior tibial angioplasty for focal perforation with resolution of the perforation (3 mm Sterling) 6.  Mynx closure of the right common femoral artery 7.  55 minutes of monitored moderate conscious sedation time   Indications: Patient is a 64 year old female who was seen in consultation by Dr. Trula Slade for left lower extremity rest pain with severely depressed ABIs and duplex showing likely popliteal disease.  She presents today for left lower extremity arteriogram after risk benefits discussed.  Findings:  Aortogram was performed with CO2 to limit contrast and there was no flow-limiting aortoiliac stenosis.  Left lower extremity arteriogram showed a  patent common femoral profunda and widely patent SFA in the proximal to mid segment.  At [facility] canal in the distal SFA and above-knee popliteal artery showed a very short focal occlusion with large collateral around this.  She had a patent below-knee popliteal artery with three-vessel runoff.  Anterior tibial appeared to be dominant runoff.   Ultimately this lesion was crossed in the distal SFA/above knee popliteal artery  and primarily stented with a 6 mm x 60 mm Eluvia.  She now has excellent inline flow down the left lower extremity with no residual stenosis and three-vessel runoff.  There was a focal perforation in the anterior tibial where our wire likely made a perforation during exchange of balloons and stents.  Ultimately this was treated with a very low inflation of a 3 mm Sterling for 3 minutes with resolution.  Patient has preserved three vessel runoff.              Procedure:  The patient was identified in the holding area and taken to room 8.  The patient was then placed supine on the table and prepped and draped in the usual sterile fashion.  A time out was called.  Ultrasound was used to evaluate the right common femoral artery.  It was patent .  A digital ultrasound image was acquired.  A micropuncture needle was used to access the right common femoral artery under ultrasound guidance.  An 018 wire was advanced without resistance and a micropuncture sheath was placed.  The 018 wire was removed and a benson wire was placed.  The micropuncture sheath was exchanged for a 5 french sheath.  An omniflush catheter was advanced over the wire to the level of L-1.  An abdominal angiogram was obtained.  Next, using glidewire and straight 4 French flush catheter the aortic bifurcation was cross because the Omni would not track.  Placed the catheter into theleft external iliac artery and left runoff was obtained.  Ultimately after evaluating the pictures elected to intervene on the distal left SFA above-knee  popliteal occlusion.  Used a long Glidewire advantage down the SFA and exchanged for a 6 [place] Ansell sheath in the right groin over the aortic bifurcation.  Patient was given 100 units per kilogram heparin.  I then primarily stented this lesion with a 6 mm x 60 mm Eluvia that was postdilated with a 5 mm x 60 mm Mustang.  Final pictures showed no residual stenosis with excellent inline flow down the left lower extremity and three-vessel runoff.  There was a focal perforation in the proximal anterior tibial that I think was from our wire.  I then exchanged for a V 18 wire down the anterior tibial and then did a very slow inflation with a 3 mm Sterling in the left anterior tibial artery for 3 minutes.  There appeared to be resolution of the perforation after taking multiple additional pictures.  Wires and catheters removed and a short 6 French sheath was placed in the right groin.  We placed a mynx closure device.   Plan: Patient will be loaded on Plavix and will need to continue aspirin and Plavix.  Should have inline flow down left leg now.   Marty Heck, MD [facility] Office: [phone]   DG Chest Port 1 View  Result Date: 01/26/2020 CLINICAL DATA:  Shortness of breath.  COVID-19 positive EXAM: PORTABLE CHEST 1 VIEW COMPARISON:  Jan 24, 2020 FINDINGS: There is diffuse airspace interstitial opacity throughout the lungs, slightly increased overall on the left and stable on the right. Heart is enlarged with pulmonary venous hypertension, stable. No adenopathy appreciable. There is aortic atherosclerosis. Postoperative change noted in the lower cervical spine. IMPRESSION: Extensive opacity bilaterally, stable on the right and slightly increased on the left. Suspect multifocal pneumonia. There may well be a degree of underlying edema and/or ARDS. More than one of these entities may  be present concurrently. Stable cardiac prominence. No adenopathy. Aortic  Atherosclerosis (ICD10-I70.0). Electronically Signed   By: Lowella Grip III M.D.   On: 01/26/2020 14:10   DG Chest Port 1V same Day  Result Date: 01/24/2020 CLINICAL DATA:  COVID-19 positive.  Respiratory distress EXAM: PORTABLE CHEST 1 VIEW COMPARISON:  January 16, 2020 FINDINGS: There is extensive interstitial and airspace opacity throughout the lungs bilaterally with increase in consolidation in the bases compared to most recent study. There is cardiomegaly with pulmonary venous hypertension. No adenopathy. There is aortic atherosclerosis. Postoperative change noted in the lower cervical spine. IMPRESSION: Widespread interstitial and airspace opacity bilaterally. Cardiomegaly with pulmonary vascular congestion. Suspect a degree of congestive heart failure with multifocal pneumonia superimposed. Increased opacity in the lung bases compared to prior study. Aortic Atherosclerosis (ICD10-I70.0). Electronically Signed   By: Lowella Grip III M.D.   On: 01/24/2020 10:33   DG Chest Port 1V same Day  Result Date: 01/16/2020 CLINICAL DATA:  Shortness of breath.  COVID. EXAM: PORTABLE CHEST 1 VIEW COMPARISON:  Three days ago FINDINGS: More confluent bilateral interstitial and airspace opacity. Cardiomegaly. No visible effusion or pneumothorax. IMPRESSION: Worsening bilateral pneumonia. Electronically Signed   By: Monte Fantasia M.D.   On: 01/16/2020 08:34   DG Foot Complete Left  Result Date: 01/05/2020 CLINICAL DATA:  Swelling EXAM: LEFT FOOT - COMPLETE 3+ VIEW COMPARISON:  None. FINDINGS: Degenerative changes in the 1st MTP joint. No acute bony abnormality. Specifically, no fracture, subluxation, or dislocation. Vascular calcifications noted. No bone destruction seen to suggest osteomyelitis. IMPRESSION: No acute bony abnormality. Electronically Signed   By: Rolm Baptise M.D.   On: 01/05/2020 01:25   VAS [place] ABI WITH/WO TBI  Result Date: 01/05/2020 LOWER EXTREMITY DOPPLER STUDY Indications: Blue,  cold toes. High Risk Factors: Hypertension, Diabetes.  Limitations: Today's exam was limited due to Patient body habitus. Comparison Study: No prior studies. Performing Technologist: Carlos Levering Rvt  Examination Guidelines: A complete evaluation includes at minimum, Doppler waveform signals and systolic blood pressure reading at the level of bilateral brachial, anterior tibial, and posterior tibial arteries, when vessel segments are accessible. Bilateral testing is considered an integral part of a complete examination. Photoelectric Plethysmograph (PPG) waveforms and toe systolic pressure readings are included as required and additional duplex testing as needed. Limited examinations for reoccurring indications may be performed as noted.  ABI Findings: +---------+------------------+-----+---------+--------+ Right    Rt Pressure (mmHg)IndexWaveform Comment  +---------+------------------+-----+---------+--------+ Brachial 150                    triphasic         +---------+------------------+-----+---------+--------+ PTA      108               0.72 biphasic          +---------+------------------+-----+---------+--------+ DP       137               0.91 triphasic         +---------+------------------+-----+---------+--------+ Great Toe76                0.51                   +---------+------------------+-----+---------+--------+ +---------+------------------+-----+----------+-------+ Left     Lt Pressure (mmHg)IndexWaveform  Comment +---------+------------------+-----+----------+-------+ Brachial 136                    triphasic         +---------+------------------+-----+----------+-------+ PTA  66                0.44 monophasic        +---------+------------------+-----+----------+-------+ DP       53                0.35 monophasic        +---------+------------------+-----+----------+-------+ Great Toe0                 0.00                    +---------+------------------+-----+----------+-------+ +-------+-----------+-----------+------------+------------+ ABI/TBIToday's ABIToday's TBIPrevious ABIPrevious TBI +-------+-----------+-----------+------------+------------+ Right  0.91       0.51                                +-------+-----------+-----------+------------+------------+ Left   0.44       0                                   +-------+-----------+-----------+------------+------------+  Summary: Right: Resting right ankle-brachial index indicates mild right lower extremity arterial disease. The right toe-brachial index is abnormal. Left: Resting left ankle-brachial index indicates severe left lower extremity arterial disease. The left toe-brachial index is abnormal.  *See table(s) above for measurements and observations.  Electronically signed by [name] MD on 01/05/2020 at 7:35:40 PM.    Final    VAS [place] LOWER EXTREMITY ARTERIAL DUPLEX  Result Date: 01/05/2020 LOWER EXTREMITY ARTERIAL DUPLEX STUDY Indications: Blue, cold toes. High Risk Factors: Hypertension, Diabetes.  Current ABI: R 0.91 L 0.44 Limitations: Patient body habitus, poor ultrasound/tissue interface. Comparison Study: No prior studies. Performing Technologist: Oliver Hum RVT  Examination Guidelines: A complete evaluation includes B-mode imaging, spectral Doppler, color Doppler, and power Doppler as needed of all accessible portions of each vessel. Bilateral testing is considered an integral part of a complete examination. Limited examinations for reoccurring indications may be performed as noted.  +-----------+--------+-----+--------+----------+------------------+ LEFT       PSV cm/sRatioStenosisWaveform  Comments           +-----------+--------+-----+--------+----------+------------------+ EIA Distal 116                  biphasic                     +-----------+--------+-----+--------+----------+------------------+ CFA Mid    132                   biphasic                     +-----------+--------+-----+--------+----------+------------------+ DFA        111                  biphasic                     +-----------+--------+-----+--------+----------+------------------+ SFA Prox   104                  biphasic                     +-----------+--------+-----+--------+----------+------------------+ SFA Mid    85                   biphasic                     +-----------+--------+-----+--------+----------+------------------+  SFA Distal 91                   biphasic                     +-----------+--------+-----+--------+----------+------------------+ POP Prox   26                   monophasic                   +-----------+--------+-----+--------+----------+------------------+ POP Distal 0                              Unable to insonate +-----------+--------+-----+--------+----------+------------------+ ATA Prox   73                   biphasic                     +-----------+--------+-----+--------+----------+------------------+ ATA Mid    33                   monophasic                   +-----------+--------+-----+--------+----------+------------------+ ATA Distal 32                   monophasic                   +-----------+--------+-----+--------+----------+------------------+ PTA Prox                                  Unable to insonate +-----------+--------+-----+--------+----------+------------------+ PTA Mid    27                   monophasic                   +-----------+--------+-----+--------+----------+------------------+ PTA Distal 9                    monophasic                   +-----------+--------+-----+--------+----------+------------------+ PERO Prox                                 Unable to insonate +-----------+--------+-----+--------+----------+------------------+ PERO Mid                                  Unable to  insonate +-----------+--------+-----+--------+----------+------------------+ PERO Distal15                   monophasic                   +-----------+--------+-----+--------+----------+------------------+ DP         29                   monophasic                   +-----------+--------+-----+--------+----------+------------------+  Summary: Left: The external iliac, common femoral, and superficial femoral arteries appear patent with no significant stenosis. Monophasic waveforms are noted in the proximal popliteal artery with poor visualization of the distal segment. Which suggests a possible occlusion. Anterior tibial artery appears patent throughout with monophasic flow. The posterior tibial, and peroneal arteries demonstrated monophasic flow  with likely areas of occlusive disease. The proximal calf arteries were difficult to visualize.  See table(s) above for measurements and observations. Electronically signed by [name] MD on 01/05/2020 at 35:34:41 PM.    Final    VAS [place] LOWER EXTREMITY VENOUS (DVT)  Result Date: 01/13/2020  Lower Venous DVTStudy Indications: Swelling.  Limitations: Body habitus and poor ultrasound/tissue interface. Performing Technologist: Antonieta Pert RDMS, RVT  Examination Guidelines: A complete evaluation includes B-mode imaging, spectral Doppler, color Doppler, and power Doppler as needed of all accessible portions of each vessel. Bilateral testing is considered an integral part of a complete examination. Limited examinations for reoccurring indications may be performed as noted. The reflux portion of the exam is performed with the patient in reverse Trendelenburg.  +---------+---------------+---------+-----------+----------+-------------------+ RIGHT    CompressibilityPhasicitySpontaneityPropertiesThrombus Aging      +---------+---------------+---------+-----------+----------+-------------------+ CFV      Partial        Yes      Yes                   patient body                                                              habitus and                                                               position limiting                                                         evaluation          +---------+---------------+---------+-----------+----------+-------------------+ SFJ      Full                                                             +---------+---------------+---------+-----------+----------+-------------------+ FV Prox                          Yes                                      +---------+---------------+---------+-----------+----------+-------------------+ FV Mid                           Yes                                      +---------+---------------+---------+-----------+----------+-------------------+ FV Distal  Yes                                      +---------+---------------+---------+-----------+----------+-------------------+ PFV      Full                                                             +---------+---------------+---------+-----------+----------+-------------------+ POP      Full                                                             +---------+---------------+---------+-----------+----------+-------------------+ PTV                                                   not clearly                                                               visualized          +---------+---------------+---------+-----------+----------+-------------------+ PERO                                                  not clearly                                                               visualized          +---------+---------------+---------+-----------+----------+-------------------+   +---------+---------------+---------+-----------+----------+------------------+ LEFT      CompressibilityPhasicitySpontaneityPropertiesThrombus Aging     +---------+---------------+---------+-----------+----------+------------------+ CFV      Full           Yes      Yes                                     +---------+---------------+---------+-----------+----------+------------------+ SFJ      Full                                                            +---------+---------------+---------+-----------+----------+------------------+ FV Prox  Full                                                            +---------+---------------+---------+-----------+----------+------------------+  FV Mid   Full                                                            +---------+---------------+---------+-----------+----------+------------------+ FV Distal                        Yes                                     +---------+---------------+---------+-----------+----------+------------------+ PFV      Full                                                            +---------+---------------+---------+-----------+----------+------------------+ POP      Full           Yes      Yes                                     +---------+---------------+---------+-----------+----------+------------------+ PTV                                                   not clearly                                                              visualized         +---------+---------------+---------+-----------+----------+------------------+ PERO                                                  not clearly                                                              visualized         +---------+---------------+---------+-----------+----------+------------------+    Summary: RIGHT: - There is no evidence of deep vein thrombosis in the lower extremity. However, portions of this examination were limited- see technologist comments above.  LEFT: - There is no  evidence of deep vein thrombosis in the lower extremity. However, portions of this examination were limited- see technologist comments above.  - Prominent lymph nodes noted bilateral groin areas.  *See table(s) above for measurements and observations. Electronically signed by Deitra Mayo MD on 01/13/2020 at 6:50:27 PM.    Final    ECHOCARDIOGRAM LIMITED  Result Date: 01/15/2020  ECHOCARDIOGRAM LIMITED REPORT   Patient Name:   KILYN BURDETT Date of Exam: 01/15/2020 Medical Rec #:  [id number]     Height:       61.0 in Accession #:    [id number]    Weight:       249.3 lb Date of Birth:  08/06/1956      BSA:          2.074 m Patient Age:    74 years      BP:           131/70 mmHg Patient Gender: F             HR:           53 bpm. Exam Location:  Inpatient Procedure: Limited Echo, Limited Color Doppler and Cardiac Doppler Indications:    Acute Respiratory Insufficiency R06.89  History:        Patient has prior history of Echocardiogram examinations, most                 recent 12/02/2016. Risk Factors:Hypertension and Dyslipidemia.  Sonographer:    Mikki Santee RDCS (AE) Referring Phys: [place]  1. Left ventricular diastolic parameters are consistent with Grade II diastolic dysfunction (pseudonormalization).  2. Right ventricular systolic function is moderately reduced. There is severely elevated pulmonary artery systolic pressure.  3. The inferior vena cava is normal in size with greater than 50% respiratory variability, suggesting right atrial pressure of 3 mmHg. FINDINGS  Left Ventricle: Indeterminate filling pressures. Right Ventricle: Right ventricular systolic function is moderately reduced. There is severely elevated pulmonary artery systolic pressure. The tricuspid regurgitant velocity is 3.86 m/s, and with an assumed right atrial pressure of 3 mmHg, the estimated right ventricular systolic pressure is [id number]. Tricuspid Valve: Tricuspid valve regurgitation is mild.  Venous: The inferior vena cava is normal in size with greater than 50% respiratory variability, suggesting right atrial pressure of 3 mmHg.  LEFT VENTRICLE PLAX 2D LVOT diam:     2.10 cm  Diastology LV SV:         81       LV e' lateral:   5.61 cm/s LV SV Index:   39       LV E/e' lateral: 12.1 LVOT Area:     3.46 cm LV e' medial:    6.40 cm/s                         LV E/e' medial:  10.6  RIGHT VENTRICLE TAPSE (M-mode): 1.5 cm LEFT ATRIUM           Index       RIGHT ATRIUM           Index LA Vol (A4C): 37.4 ml 18.03 ml/m RA Area:     16.80 cm                                   RA Volume:   44.50 ml  21.45 ml/m  AORTIC VALVE LVOT Vmax:   98.60 cm/s LVOT Vmean:  69.800 cm/s LVOT VTI:    0.235 m MITRAL VALVE               TRICUSPID VALVE MV Area (PHT): 3.53 cm    TR Peak grad:   59.6 mmHg MV Decel Time: 215 msec    TR Vmax:  386.00 cm/s MV E velocity: 67.70 cm/s MV A velocity: 65.10 cm/s  SHUNTS MV E/A ratio:  1.04        Systemic VTI:  0.24 m                            Systemic Diam: 2.10 cm Skeet Latch MD Electronically signed by Skeet Latch MD Signature Date/Time: 01/15/2020/3:50:03 PM    Final

## 2020-01-26 NOTE — Progress Notes (Signed)
Inpatient Diabetes Program Recommendations  AACE/ADA: New Consensus Statement on Inpatient Glycemic Control (2015)  Target Ranges:  Prepandial:   less than 140 mg/dL      Peak postprandial:   less than 180 mg/dL (1-2 hours)      Critically ill patients:  140 - 180 mg/dL   Lab Results  Component Value Date   GLUCAP 201 (H) 01/26/2020   HGBA1C 12.2 (H) 01/05/2020    Review of Glycemic Control Results for [name], [name] (MRN [id number]) as of 01/26/2020 10:46  Ref. Range 01/25/2020 08:10 01/25/2020 08:38 01/25/2020 12:52 01/25/2020 16:18 01/25/2020 21:19 01/26/2020 08:41 01/26/2020 10:02  Glucose-Capillary Latest Ref Range: 70 - 99 mg/dL 68 (L) 101 (H) 228 (H) 185 (H) 203 (H) 47 (L) 201 (H)   Diabetes history: DM 2 Outpatient Diabetes medications: Lantus 50 units qam, Novolog 3-20 units tid with meals for glucose > 150 mg/dl Current orders for Inpatient glycemic control:  Novolog 0-9 units tid + hs Tradjenta 5 mg Daily  Consult: DM and insulin teaching. A1c 12.2 on 4/19.  DM Coordinator saw and educated pt on 4/20 thank you.  Hypoglycemia Lantus d/c'd. Dr. Candiss Norse following.  Thanks,  Tama Headings RN, MSN, BC-ADM Inpatient Diabetes Coordinator Team Pager [phone] (8a-5p)

## 2020-01-26 NOTE — Progress Notes (Signed)
Hypoglycemic Event  CBG: 47  Treatment: D50 50 mL (25 gm)  Symptoms: Nervous/irritable  Follow-up CBG: Time:1002 CBG Result: 201  Possible Reasons for Event: Inadequate meal intake  Comments/MD notified: Dr. Barbarann Ehlers

## 2020-01-27 LAB — BLOOD GAS, ARTERIAL
Acid-Base Excess: 8.5 mmol/L — ABNORMAL HIGH (ref 0.0–2.0)
Bicarbonate: 32.3 mmol/L — ABNORMAL HIGH (ref 20.0–28.0)
Drawn by: 56037
FIO2: 100
O2 Saturation: 88.6 %
Patient temperature: 36.9
pCO2 arterial: 42.7 mmHg (ref 32.0–48.0)
pH, Arterial: 7.49 — ABNORMAL HIGH (ref 7.350–7.450)
pO2, Arterial: 56 mmHg — ABNORMAL LOW (ref 83.0–108.0)

## 2020-01-27 LAB — GLUCOSE, CAPILLARY
Glucose-Capillary: 100 mg/dL — ABNORMAL HIGH (ref 70–99)
Glucose-Capillary: 234 mg/dL — ABNORMAL HIGH (ref 70–99)
Glucose-Capillary: 241 mg/dL — ABNORMAL HIGH (ref 70–99)
Glucose-Capillary: 243 mg/dL — ABNORMAL HIGH (ref 70–99)

## 2020-01-27 LAB — COMPREHENSIVE METABOLIC PANEL
ALT: 49 U/L — ABNORMAL HIGH (ref 0–44)
AST: 57 U/L — ABNORMAL HIGH (ref 15–41)
Albumin: 2.6 g/dL — ABNORMAL LOW (ref 3.5–5.0)
Alkaline Phosphatase: 80 U/L (ref 38–126)
Anion gap: 12 (ref 5–15)
BUN: 40 mg/dL — ABNORMAL HIGH (ref 8–23)
CO2: 30 mmol/L (ref 22–32)
Calcium: 8.3 mg/dL — ABNORMAL LOW (ref 8.9–10.3)
Chloride: 101 mmol/L (ref 98–111)
Creatinine, Ser: 1.54 mg/dL — ABNORMAL HIGH (ref 0.44–1.00)
GFR calc Af Amer: 41 mL/min — ABNORMAL LOW (ref >60)
GFR calc non Af Amer: 36 mL/min — ABNORMAL LOW (ref >60)
Glucose, Bld: 108 mg/dL — ABNORMAL HIGH (ref 70–99)
Potassium: 4 mmol/L (ref 3.5–5.1)
Sodium: 143 mmol/L (ref 135–145)
Total Bilirubin: 1.4 mg/dL — ABNORMAL HIGH (ref 0.3–1.2)
Total Protein: 5.1 g/dL — ABNORMAL LOW (ref 6.5–8.1)

## 2020-01-27 LAB — CBC WITH DIFFERENTIAL/PLATELET
Abs Immature Granulocytes: 0.19 10*3/uL — ABNORMAL HIGH (ref 0.00–0.07)
Basophils Absolute: 0 10*3/uL (ref 0.0–0.1)
Basophils Relative: 0 %
Eosinophils Absolute: 0.4 10*3/uL (ref 0.0–0.5)
Eosinophils Relative: 3 %
HCT: 33.6 % — ABNORMAL LOW (ref 36.0–46.0)
Hemoglobin: 10.4 g/dL — ABNORMAL LOW (ref 12.0–15.0)
Immature Granulocytes: 1 %
Lymphocytes Relative: 6 %
Lymphs Abs: 0.9 10*3/uL (ref 0.7–4.0)
MCH: 30.7 pg (ref 26.0–34.0)
MCHC: 31 g/dL (ref 30.0–36.0)
MCV: 99.1 fL (ref 80.0–100.0)
Monocytes Absolute: 1.1 10*3/uL — ABNORMAL HIGH (ref 0.1–1.0)
Monocytes Relative: 7 %
Neutro Abs: 12.6 10*3/uL — ABNORMAL HIGH (ref 1.7–7.7)
Neutrophils Relative %: 83 %
Platelets: 148 10*3/uL — ABNORMAL LOW (ref 150–400)
RBC: 3.39 MIL/uL — ABNORMAL LOW (ref 3.87–5.11)
RDW: 21.2 % — ABNORMAL HIGH (ref 11.5–15.5)
WBC: 15.3 10*3/uL — ABNORMAL HIGH (ref 4.0–10.5)
nRBC: 2.5 % — ABNORMAL HIGH (ref 0.0–0.2)

## 2020-01-27 LAB — D-DIMER, QUANTITATIVE: D-Dimer, Quant: 7.65 ug/mL-FEU — ABNORMAL HIGH (ref 0.00–0.50)

## 2020-01-27 LAB — BRAIN NATRIURETIC PEPTIDE: B Natriuretic Peptide: 165 pg/mL — ABNORMAL HIGH (ref 0.0–100.0)

## 2020-01-27 MED ORDER — FUROSEMIDE 10 MG/ML IJ SOLN
80.0000 mg | Freq: Two times a day (BID) | INTRAMUSCULAR | Status: DC
  Administered 2020-01-27 – 2020-01-29 (×5): 80 mg via INTRAVENOUS
  Filled 2020-01-27 (×5): qty 8

## 2020-01-27 MED ORDER — ENOXAPARIN SODIUM 100 MG/ML ~~LOC~~ SOLN
100.0000 mg | Freq: Two times a day (BID) | SUBCUTANEOUS | Status: DC
  Administered 2020-01-27 – 2020-01-29 (×5): 100 mg via SUBCUTANEOUS
  Filled 2020-01-27 (×5): qty 1

## 2020-01-27 MED ORDER — CHLORHEXIDINE GLUCONATE CLOTH 2 % EX PADS
6.0000 | MEDICATED_PAD | Freq: Every day | CUTANEOUS | Status: DC
  Administered 2020-01-27 – 2020-02-05 (×10): 6 via TOPICAL

## 2020-01-27 MED ORDER — INSULIN ASPART 100 UNIT/ML ~~LOC~~ SOLN
3.0000 [IU] | Freq: Three times a day (TID) | SUBCUTANEOUS | Status: DC
  Administered 2020-01-27: 3 [IU] via SUBCUTANEOUS

## 2020-01-27 MED ORDER — METOLAZONE 2.5 MG PO TABS
2.5000 mg | ORAL_TABLET | Freq: Once | ORAL | Status: AC
  Administered 2020-01-27: 2.5 mg via ORAL
  Filled 2020-01-27: qty 1

## 2020-01-27 MED ORDER — OXYCODONE-ACETAMINOPHEN 5-325 MG PO TABS: 1.0000 | ORAL_TABLET | Freq: Three times a day (TID) | ORAL | Status: DC | PRN

## 2020-01-27 NOTE — TOC Progression Note (Signed)
Transition of Care [facility]) - Progression Note    Patient Details  Name: [name] MRN: [id number] Date of Birth: 1955/10/30  [facility]) CM/SW Contact  Carles Collet, RN Phone Number: 01/27/2020, 10:07 AM  Clinical Narrative:    Reached out to [facility]al[facility] to see if family has paid outstanding balance, awaiting response.   Expected Discharge Plan: Skilled Nursing Facility Barriers to Discharge: Insurance Authorization  Expected Discharge Plan and Services Expected Discharge Plan: [place] Choice: [place] arrangements for the past 2 months: Single Family Home Expected Discharge Date: 01/26/20                                     Social Determinants of Health (SDOH) Interventions    Readmission Risk Interventions Readmission Risk Prevention Plan 01/09/2020  Transportation Screening Complete  PCP or Specialist Appt within 3-5 Days Complete  HRI or [place] Complete  Social Work Consult for [place] Planning/Counseling Complete  Palliative Care Screening Not Applicable  Medication Review Press photographer) Complete  Some recent data might be hidden

## 2020-01-27 NOTE — Progress Notes (Signed)
ANTICOAGULATION CONSULT NOTE - Initial Consult  Pharmacy Consult for Lovenox Indication: COVID+ and rising D-Dimer Allergies  Allergen Reactions  . Pork Allergy   . Pork-Derived Products Nausea And Vomiting    Patient Measurements: Height: 5\' 1"  (154.9 cm) Weight: 103.5 kg (228 lb 2.8 oz) IBW/kg (Calculated) : 47.8  Vital Signs: Temp: 97.6 F (36.4 C) (05/11 0324) Temp Source: Axillary (05/11 0324) BP: 127/57 (05/11 0324) Pulse Rate: 68 (05/11 0551)  Labs: Recent Labs    01/25/20 0800 01/25/20 0800 01/26/20 0353 01/27/20 0427  HGB 10.6*   < > 10.6* 10.4*  HCT 32.9*  --  33.0* 33.6*  PLT 202  --  186 148*  CREATININE 1.74*  --  1.73* 1.54*   < > = values in this interval not displayed.    Estimated Creatinine Clearance: 41.4 mL/min (A) (by C-G formula based on SCr of 1.54 mg/dL (H)).   Medical History: Past Medical History:  Diagnosis Date  . Achilles rupture   . Anxiety   . Arthritis   . Asthma   . Chronic back pain   . Chronic diastolic heart failure ([place]) 11/30/2016  . Chronic headaches   . Chronic leg pain    bilaterally  . CHRONIC OBSTRUCTIVE PULMONARY DISEASE, MODERATE 03/26/2010   Annotation: with moderate restrictive lung disease as well per PFTs  04/02/07--actual numbers not noted in old records Qualifier: Diagnosis of  By: Amil Amen MD, Benjamine Mola    . Depression   . Diabetes mellitus 1999   T2DM  . Glaucoma   . Gout   . H/O gestational diabetes mellitus, not currently pregnant   . Hyperlipidemia   . Hypertension   . Morbid obesity ([place])   . Obstructive sleep apnea on CPAP 07/02/2009   Qualifier: Diagnosis of  By: Amil Amen MD, Benjamine Mola    . OSA (obstructive sleep apnea)   . Sciatica     Medications:  Scheduled:  . vitamin C  500 mg Oral Daily  . aspirin EC  81 mg Oral Q breakfast  . atorvastatin  40 mg Oral Daily  . brimonidine  1 drop Both Eyes BID  . clopidogrel  75 mg Oral Daily  . colchicine  0.6 mg Oral Daily  . dorzolamide-timolol   1 drop Both Eyes BID  . enoxaparin (LOVENOX) injection  40 mg Subcutaneous Q12H  . furosemide  80 mg Intravenous BID  . insulin aspart  0-9 Units Subcutaneous TID WC  . insulin glargine  10 Units Subcutaneous QHS  . Ipratropium-Albuterol  1 puff Inhalation Q6H  . latanoprost  1 drop Both Eyes QHS  . linagliptin  5 mg Oral Daily  . metolazone  2.5 mg Oral Once  . pantoprazole  40 mg Oral Q1200  . prednisoLONE acetate  1 drop Right Eye BID  . sodium bicarbonate  1,300 mg Oral BID  . sodium chloride flush  3 mL Intravenous Once  . zinc sulfate  220 mg Oral Daily    Assessment: 64 y.o. female with admitted with SOB/COVID+ PNA, now with rising d-dimer, for treatment dose Lovenox Goal of Therapy:  Full anticoagulation with Lovenox Monitor platelets by anticoagulation protocol: Yes   Plan:  Change Lovenox 100 mg SQ q12h  Caryl Pina 01/27/2020,7:39 AM

## 2020-01-27 NOTE — Significant Event (Signed)
Rapid Response Event Note  Overview: Respiratory failure in setting of Covid PNA  Initial Focused Assessment: I was notified by Josh RT of pts increased WOB and pt stating "I'm tired", now requiring 30L HHFNC and 15L NRB to maintain oxygen sats within goal. Upon arrival, Ms. Skillin is alert, oriented to self, time and place. She is able to speak in short 2-3 word phrases. Strong, frequent cough with dark tan/brown. BBS diffuse rales throughout. Labored respirations with abdominal accessory muscle use. Skin is warm, pink and moist. Cap refill about 3 secs. [place] provider notified by Jonelle Sidle. Josh RT at bedside during call and obtained ABG. High probability for transfer to ICU. PCXR from earlier today shows ARDS with multifocal bilateral infiltrates. Pt was diuresed earlier today with Lasix 80 mg IV with good response (approximately 2L out today).  I spoke with pts Daughter Juluis Rainier and provided an update. Dr. Myna Hidalgo came to bedside. PCCM consulted. Pt transferred to [id number] without complication.  2330- 98.60F, HR 72 SR, 126/58 (78), RR 36 with sats 93-100% on 30L HHFNC and 15L NRB mask.   ABG- 7.49/42.7/56/32  Interventions: -ABG -Transfer to ICU [id number])   Event Summary: Call received 2250 Arrived at call 2300 Call ended 0110  Madelynn Done

## 2020-01-27 NOTE — Progress Notes (Addendum)
Dr Myna Hidalgo in to evaluate patient. ICU transfer pending. Update given to patients daughter by RR RN, Shanon Brow. Charge RN notified of pending ICU transfer.

## 2020-01-27 NOTE — Progress Notes (Signed)
Occupational Therapy Treatment Patient Details Name: [name] MRN: [id number] DOB: 1955-12-04 Today's Date: 01/27/2020    History of present illness 64 year old female admitted 01/09/2020 with SOB and cough. Patient with acute hypoxic respiratory failure due to COVID PNA and decompensatd diastolic heart failure. Of note, patient with recent hospitalization 4/18-4/23/21 for stent to L distal SFA. This admission, steroids and Remdesivir started 01/13/20. Patient with elevated troponin likely secondary to underlying CKD, not consistent with ACS. PMH: hypertension, HLD, DM-2, CKD stage IV, PVD s/p stent to left distal SFA on [id number], chronic diastolic heart failure   OT comments  Patient supine in bed on arrival and on 15L [place] and 25L [facility].  Nursing helping with bath at bed level, requiring max assist.  Patient able to roll with mod assist x2 and needed max x2 to go sidelying to sit.  Transferred to chair with max assist x2 and fatigued quickly, needing to sit after <1 min of standing.  Desat to 72 during transfer and recovered back to mid 90s <30 min.  She required lengthy rest between any movement to recover.  Practiced pursed lip breathing throughout the session.  She also benefited from encouragement and relaxation techniques as she seemed down during session.  Will continue to follow with OT acutely to address the deficits listed below.    Follow Up Recommendations  SNF;Supervision/Assistance - 24 hour    Equipment Recommendations  3 in 1 bedside commode    Recommendations for Other Services      Precautions / Restrictions Precautions Precautions: Fall Precaution Comments: High O2 need, desats Restrictions Weight Bearing Restrictions: No       Mobility Bed Mobility Overal bed mobility: Needs Assistance Bed Mobility: Sidelying to Sit;Rolling Rolling: Mod assist;+2 for safety/equipment Sidelying to sit: Max assist          Transfers Overall transfer level: Needs  assistance Equipment used: 2 person hand held assist Transfers: Sit to/from [facility] Sit to Stand: Mod assist;+2 safety/equipment;+2 physical assistance Stand pivot transfers: Mod assist;+2 safety/equipment;+2 physical assistance       General transfer comment: fatigued quickly    Balance Overall balance assessment: Needs assistance Sitting-balance support: Feet supported Sitting balance-Leahy Scale: Fair     Standing balance support: Bilateral upper extremity supported Standing balance-Leahy Scale: Poor                             ADL either performed or assessed with clinical judgement   ADL Overall ADL's : Needs assistance/impaired Eating/Feeding: Sitting;Set up       Upper Body Bathing: Maximal assistance;Bed level   Lower Body Bathing: Maximal assistance;Bed level   Upper Body Dressing : Moderate assistance;Sitting                   Functional mobility during ADLs: Maximal assistance;+2 for physical assistance General ADL Comments: Patient a little overwhelmed during session, many people in the room (nurses, tech)     Vision       Perception     Praxis      Cognition Arousal/Alertness: Awake/alert Behavior During Therapy: Flat affect Overall Cognitive Status: No family/caregiver present to determine baseline cognitive functioning Area of Impairment: Attention;Safety/judgement;Problem solving                   Current Attention Level: Selective     Safety/Judgement: Decreased awareness of safety;Decreased awareness of deficits   Problem Solving: Decreased initiation;Slow processing;Difficulty  sequencing;Requires tactile cues;Requires verbal cues General Comments: Patient likes to "focus on breathing" and not respond, seems to lose attention        Exercises     Shoulder Instructions       General Comments      Pertinent Vitals/ Pain       Pain Assessment: Faces Faces Pain Scale: Hurts even  more Pain Location: buttocks (sores) Pain Descriptors / Indicators: Discomfort;Grimacing Pain Intervention(s): Repositioned;Limited activity within patient's tolerance;Monitored during session  Home Living                                          Prior Functioning/Environment              Frequency  Min 2X/week        Progress Toward Goals  OT Goals(current goals can now be found in the care plan section)  Progress towards OT goals: Progressing toward goals  Acute Rehab OT Goals Patient Stated Goal: To breathe better OT Goal Formulation: With patient Time For Goal Achievement: 01/29/20 Potential to Achieve Goals: Good  Plan Discharge plan remains appropriate    Co-evaluation                 AM-PAC OT "6 Clicks" Daily Activity     Outcome Measure   Help from another person eating meals?: A Little Help from another person taking care of personal grooming?: A Little Help from another person toileting, which includes using toliet, bedpan, or urinal?: A Lot Help from another person bathing (including washing, rinsing, drying)?: A Lot Help from another person to put on and taking off regular upper body clothing?: A Lot Help from another person to put on and taking off regular lower body clothing?: A Lot 6 Click Score: 14    End of Session Equipment Utilized During Treatment: Rolling walker;Oxygen  OT Visit Diagnosis: Unsteadiness on feet (R26.81);History of falling (Z91.81);Muscle weakness (generalized) (M62.81)   Activity Tolerance Patient limited by fatigue;Treatment limited secondary to medical complications (Comment)(Desat and high O2 demand)   Patient Left in chair;with call bell/phone within reach   Nurse Communication Mobility status        Time: [id number] OT Time Calculation (min): 48 min  Charges: OT General Charges $OT Visit: 1 Visit OT Treatments $Self Care/Home Management : 23-37 mins $Therapeutic Activity: 8-22  mins  August Luz, OTR/L   Phylliss Bob 01/27/2020, 3:11 PM

## 2020-01-27 NOTE — Progress Notes (Signed)
RT asked to come asses PT for desaturation issues, shes on 30L [facility]hfnc and nrbth. Pt is visibly tachipnic and labored breathing, requesting for help with breathing. RT talked with nurse about possibe transfer to ICU.

## 2020-01-27 NOTE — Progress Notes (Signed)
Patient notably dyspneic at rest and unable to tolerate even momentarily removing non-rebreather to take meds without O2 sats immediately dropping to 70's and/or 80's. Patient notably more distressed throughout shift and stating, "I can't breathe, I just need to breathe." Charge RN notified. Requested RT and RR RN to see and evaluate patient. Patient notified of current situation with probable transfer to ICU. Verbalized understanding. MD on call, Dr Myna Hidalgo, notified of all of the above. Awaiting response.

## 2020-01-27 NOTE — Progress Notes (Signed)
PROGRESS NOTE                                                                                                                                                                                                             Patient Demographics:    [name], is a 64 y.o. female, DOB - [birth year], TD:[id number]  Outpatient Primary MD for the patient is Pavelock, Ralene Bathe, MD   Admit date - 12/21/2019   LOS - 44  Chief Complaint  Patient presents with  . Shortness of Breath  . Chest Pain       Brief Narrative: Patient is a 64 y.o. female  PMHx of hypertension, HLD, DM-2, CKD stage IV, PVD s/p stent to left distal SFA on [id number], chronic diastolic heart failure-presented with shortness of breath or cough-found to have acute hypoxic respiratory failure secondary to COVID-19 pneumonia and decompensated diastolic heart failure.  Significant Events: 4/18-4/23>> admit to [facility]-s/p stent to left distal SFA 4/27>> admit to [facility] for hypoxia from Covid/heart failure 4/27>> lower extremity Dopplers negative for DVT. [id number] grade 2 diastolic dysfunction, moderate RV systolic dysfunction.  RVSP 62.6 mmHg. 4/30>> VQ scan-low probability PE 5/5>> improved-on room air 5/8>> worsening hypoxemia-on 15 L of oxygen-with dramatic response to Lasix-down to 4 L  COVID-19 medications: Steroids: 4/27>>5/8 (refused on 5/6) Remdesivir: 4/27>> 5/1  Antibiotics: Vancomycin: 4/27 x 1 Cefepime: 4/27 x 1  Microbiology data: 4/27: Blood culture>> no growth 4/28: Blood culture>> no growth  DVT prophylaxis: SQ heparin  Procedures: None  Consults: Renal    Subjective:   Patient in bed, appears comfortable, denies any headache, no fever, no chest pain or pressure, +ve shortness of breath , no abdominal pain. No focal weakness.    Assessment  & Plan :   Acute Hypoxic Resp Failure due to Covid 19 Viral pneumonia and decompensated  diastolic heart failure EF 50% : Had improved and was on room air for the past several days-developed pulmonary edema overnight on 01/24/2020,and severely Hypoxic again, continue IV lasix + Zaroxolyn, encourage to sit in chair and use IS and FV for pulmonary toiletry. Since she is requiring nonrebreather and off and on nasal cannula will place her on heated high flow on 01/27/2020 so that we have a controlled and measurable oxygen supply and monitor.  Filed Weights   01/25/20 0500 01/26/20  0448 01/27/20 0551  Weight: 106.4 kg 109.7 kg 103.5 kg    TTE   1. Left ventricular diastolic parameters are consistent with Grade II diastolic dysfunction (pseudonormalization). EF 50%. 2. Right ventricular systolic function is moderately reduced. There is severely elevated pulmonary artery systolic pressure.  3. The inferior vena cava is normal in size with greater than 50% respiratory variability, suggesting right atrial pressure of 3 mmHg.    COVID-19 pneumonia: He had severe disease and has completed her course of remdesivir, also received Actemra.  Steroids were not used regularly as patient tended to refuse it from time to time.    Recent Labs  Lab 01/21/20 1115 01/21/20 1115 01/22/20 0739 01/22/20 1042 01/22/20 1042 01/23/20 1012 01/24/20 0812 01/25/20 0800 01/26/20 0353 01/27/20 0427  NA 139   < >  --  139   < > 139 140 141 142 143  K 4.1   < >  --  4.1   < > 5.0 4.2 3.7 4.0 4.0  CL 103   < >  --  103   < > 104 102 105 105 101  CO2 24   < >  --  22   < > 19* 24 25 27 30   GLUCOSE 132*   < >  --  121*   < > 136* 71 69* 63* 108*  BUN 27*   < >  --  84*   < > 72* 70* 57* 52* 40*  CREATININE 0.95   < >  --  2.10*   < > 1.84* 1.98* 1.74* 1.73* 1.54*  CALCIUM 8.0*   < >  --  8.4*   < > 8.1* 8.4* 8.4* 8.3* 8.3*  AST 105*   < >  --  51*  --  69*  --  64* 58* 57*  ALT 57*   < >  --  38  --  36  --  53* 52* 49*  ALKPHOS 64   < >  --  74  --  75  --  71 79 80  BILITOT 0.8   < >  --  1.4*  --   1.2  --  1.1 1.1 1.4*  ALBUMIN 2.6*   < >  --  2.7*  --  2.6*  --  2.7* 2.5* 2.6*  MG  --   --   --   --   --   --   --  2.4 2.3  --   CRP 5.7*  --  1.3*  --   --   --  0.7 0.7 0.6  --   DDIMER  --   --  5.04*  --   --   --  5.22* 5.94* 6.92* 7.65*  PROCALCITON  --   --   --   --   --   --  0.21  --   --   --   BNP  --   --   --   --   --   --  130.9* 215.0* 222.5* 165.0*   < > = values in this interval not displayed.     Acute on Chronic Diastolic heart failure EF 50% : Had improved with IV diuretics + Zaroxolyn on 01/26/20 -see above  Filed Weights   01/25/20 0500 01/26/20 0448 01/27/20 0551  Weight: 106.4 kg 109.7 kg 103.5 kg    Pulmonary hypertension/RV dysfunction: Not seen in prior echo's-I suspect this is probably from OSA-she has been noncompliant to  CPAP, she was counseled and continues to refuse it even here.  VQ scan low probability.  Will require outpatient pulmonary follow-up.  Oxygen here as needed.  Elevated D-dimer due to inflammation from Covid, at highest risk for DVT due to obesity and lack of activity.  Low probability V/Q, leg ultrasound x2 , high-dose Lovenox and monitor.  Thrombocytopenia: Mild we will continue to monitor.  Lab Results  Component Value Date   PLT 148 (L) 01/27/2020    AKI on CKD stage IV: Improving and back to baseline-AKI likely hemodynamically mediated.  Renal ultrasound without any hydronephrosis.   Acute metabolic encephalopathy: Was due to hypoxia now resolved.   Elevated troponin: Trend is flat-not consistent with ACS-likely secondary to underlying CKD-with some component from demand ischemia due to COVID-19/heart failure.  PAD-s/p stent to left SFA on 4/22: Both lower extremities appear warm-continue aspirin, Plavix and statin.  HTN: BP stable, hold off Cardura as she is being diuresed and monitor.  Leukocytosis: Secondary to intermittent use of steroids, afebrile, procalcitonin stable.  Anemia of chronic disease: Close to  baseline-likely related to CKD.  Gout: No flare-hold colchicine for now given AKI.  OSA: Noncompliant to CPAP-continue CPAP qhs if patient cooperates.  Counseled again on 01/26/2020 including her daughter.  Obesity: BMI of 44 follow with PCP.  DM-2 with hyperglycemia and hyperglycemia (A1c 12.2 on 4/19): Poor outpatient control due to hyperglycemia, is having episodes of hypoglycemia in the morning hence Lantus dose reduced on 01/25/2020, continue sliding scale, will get insulin and diabetic education as well.  Recent Labs    01/24/20 2142 01/25/20 0810 01/25/20 0838  GLUCAP 193* 68* 101*   Lab Results  Component Value Date   HGBA1C 12.2 (H) 01/05/2020     Condition - Guarded  Family Communication  : Daughter Juluis Rainier [phone] on 01/25/2020 at 10:45 AM - voicemail, 01/26/20  Code Status :  Full Code  Diet :  Diet Order            Diet heart healthy/carb modified Room service appropriate? Yes; Fluid consistency: Thin; Fluid restriction: 2000 mL Fluid  Diet effective now               Disposition Plan  :   Status is: Inpatient  Remains inpatient appropriate because:Inpatient level of care appropriate due to severity of illness  Dispo: The patient is from: Home              Anticipated d/c is to: Home              Anticipated d/c date is: >2 days              Patient currently is NOT medically stable to d/c.  Barriers to discharge: Worsening hypoxemia secondary to decompensated heart failure needing IV Lasix  Antimicorbials  :    Anti-infectives (From admission, onward)   Start     Dose/Rate Route Frequency Ordered Stop   01/15/20 1200  vancomycin (VANCOREADY) IVPB 1250 mg/250 mL  Status:  Discontinued     1,250 mg 166.7 mL/hr over 90 Minutes Intravenous Every 48 hours 01/13/20 1018 01/14/20 1038   01/14/20 1000  remdesivir 100 mg in sodium chloride 0.9 % 100 mL IVPB     100 mg 200 mL/hr over 30 Minutes Intravenous Daily 01/13/20 1129 01/17/20 1113   01/13/20  1630  valACYclovir (VALTREX) tablet 1,000 mg  Status:  Discontinued     1,000 mg Oral 3 times daily 01/13/20 1557 01/17/20 1147  01/13/20 1230  remdesivir 200 mg in sodium chloride 0.9% 250 mL IVPB     200 mg 580 mL/hr over 30 Minutes Intravenous Once 01/13/20 1129 01/13/20 1800   01/13/20 1015  ceFEPIme (MAXIPIME) 2 g in sodium chloride 0.9 % 100 mL IVPB     2 g 200 mL/hr over 30 Minutes Intravenous  Once 01/13/20 1003 01/13/20 1434   01/13/20 1015  vancomycin (VANCOREADY) IVPB 2000 mg/400 mL     2,000 mg 200 mL/hr over 120 Minutes Intravenous  Once 01/13/20 1012 01/13/20 2258      Inpatient Medications  Scheduled Meds: . vitamin C  500 mg Oral Daily  . aspirin EC  81 mg Oral Q breakfast  . atorvastatin  40 mg Oral Daily  . brimonidine  1 drop Both Eyes BID  . clopidogrel  75 mg Oral Daily  . colchicine  0.6 mg Oral Daily  . dorzolamide-timolol  1 drop Both Eyes BID  . enoxaparin (LOVENOX) injection  100 mg Subcutaneous BID  . furosemide  80 mg Intravenous BID  . insulin aspart  0-9 Units Subcutaneous TID WC  . insulin glargine  10 Units Subcutaneous QHS  . Ipratropium-Albuterol  1 puff Inhalation Q6H  . latanoprost  1 drop Both Eyes QHS  . linagliptin  5 mg Oral Daily  . metolazone  2.5 mg Oral Once  . pantoprazole  40 mg Oral Q1200  . prednisoLONE acetate  1 drop Right Eye BID  . sodium bicarbonate  1,300 mg Oral BID  . sodium chloride flush  3 mL Intravenous Once  . zinc sulfate  220 mg Oral Daily   Continuous Infusions:  PRN Meds:.acetaminophen, ALPRAZolam, chlorpheniramine-HYDROcodone, guaiFENesin-dextromethorphan, [DISCONTINUED] ondansetron **OR** ondansetron (ZOFRAN) IV, oxyCODONE-acetaminophen   Time Spent in minutes  25  See all Orders from today for further details   Lala Lund M.D on 01/27/2020 at 12:12 PM  To page go to [email or web address] - use universal password  [facility] -  Office  [phone]    Objective:   Vitals:   01/26/20 2342  01/27/20 0324 01/27/20 0551 01/27/20 1120  BP: (!) 122/56 (!) 127/57    Pulse: (!) 59 61 68 65  Resp: (!) 26 16 (!) 29 (!) 30  Temp: 97.6 F (36.4 C) 97.6 F (36.4 C)    TempSrc: Axillary Axillary    SpO2: 100% 100% 100% 97%  Weight:   103.5 kg   Height:        Wt Readings from Last 3 Encounters:  01/27/20 103.5 kg  01/08/20 115.6 kg  07/15/19 102.9 kg    No intake or output data in the 24 hours ending 01/27/20 1212  Physical Exam  Awake Alert, No new F.N deficits, Normal affect [place].AT,PERRAL Supple Neck,No JVD, No cervical lymphadenopathy appriciated.  Symmetrical Chest wall movement, Good air movement bilaterally, few rales RRR,No Gallops, Rubs or new Murmurs, No Parasternal Heave +ve B.Sounds, Abd Soft, No tenderness, No organomegaly appriciated, No rebound - guarding or rigidity. No Cyanosis, Clubbing or edema, No new Rash or bruise   Data Review:    CBC Recent Labs  Lab 01/23/20 0721 01/24/20 0812 01/25/20 0800 01/26/20 0353 01/27/20 0427  WBC 39.5* 38.5* 31.6* 27.1* 15.3*  HGB 12.7 10.7* 10.6* 10.6* 10.4*  HCT 38.9 33.3* 32.9* 33.0* 33.6*  PLT 297 244 202 186 148*  MCV 95.1 95.7 96.2 97.3 99.1  MCH 31.1 30.7 31.0 31.3 30.7  MCHC 32.6 32.1 32.2 32.1 31.0  RDW 19.1* 18.9* 19.4* 20.5* 21.2*  LYMPHSABS  --  1.7  --  1.7 0.9  MONOABS  --  2.0*  --  1.6* 1.1*  EOSABS  --  0.2  --  0.4 0.4  BASOSABS  --  0.1  --  0.1 0.0    Chemistries  Recent Labs  Lab 01/22/20 1042 01/22/20 1042 01/23/20 1012 01/24/20 0812 01/25/20 0800 01/26/20 0353 01/27/20 0427  NA 139   < > 139 140 141 142 143  K 4.1   < > 5.0 4.2 3.7 4.0 4.0  CL 103   < > 104 102 105 105 101  CO2 22   < > 19* 24 25 27 30   GLUCOSE 121*   < > 136* 71 69* 63* 108*  BUN 84*   < > 72* 70* 57* 52* 40*  CREATININE 2.10*   < > 1.84* 1.98* 1.74* 1.73* 1.54*  CALCIUM 8.4*   < > 8.1* 8.4* 8.4* 8.3* 8.3*  MG  --   --   --   --  2.4 2.3  --   AST 51*  --  69*  --  64* 58* 57*  ALT 38  --  36  --   53* 52* 49*  ALKPHOS 74  --  75  --  71 79 80  BILITOT 1.4*  --  1.2  --  1.1 1.1 1.4*   < > = values in this interval not displayed.   ------------------------------------------------------------------------------------------------------------------ No results for input(s): CHOL, HDL, LDLCALC, TRIG, CHOLHDL, LDLDIRECT in the last 72 hours.  Lab Results  Component Value Date   HGBA1C 12.2 (H) 01/05/2020   ------------------------------------------------------------------------------------------------------------------ No results for input(s): TSH, T4TOTAL, T3FREE, THYROIDAB in the last 72 hours.  Invalid input(s): FREET3 ------------------------------------------------------------------------------------------------------------------ No results for input(s): VITAMINB12, FOLATE, FERRITIN, TIBC, IRON, RETICCTPCT in the last 72 hours.  Coagulation profile No results for input(s): INR, PROTIME in the last 168 hours.  Recent Labs    01/26/20 0353 01/27/20 0427  DDIMER 6.92* 7.65*    Cardiac Enzymes No results for input(s): CKMB, TROPONINI, MYOGLOBIN in the last 168 hours.  Invalid input(s): CK ------------------------------------------------------------------------------------------------------------------    Component Value Date/Time   BNP 165.0 (H) 01/27/2020 [id number]    Micro Results No results found for this or any previous visit (from the past 240 hour(s)).  Radiology Reports DG Chest 2 View  Result Date: 01/13/2020 CLINICAL DATA:  Shortness of breath and bilateral leg swelling. EXAM: CHEST - 2 VIEW COMPARISON:  November 30, 2016 FINDINGS: Moderate severity diffuse multifocal infiltrates are seen, bilaterally. There is no evidence of a pleural effusion or pneumothorax. The cardiac silhouette is mildly enlarged. A radiopaque fusion plate and screws are seen overlying the lower cervical spine. The visualized skeletal structures are otherwise unremarkable. Radiopaque surgical clips  are seen within the right upper quadrant. IMPRESSION: Moderate severity bilateral multifocal infiltrates. Electronically Signed   By: Virgina Norfolk M.D.   On: 01/13/2020 00:27   NM Pulmonary Perfusion  Result Date: 01/16/2020 CLINICAL DATA:  Shortness of breath, history of COVID-19 positivity EXAM: NUCLEAR MEDICINE PERFUSION LUNG SCAN TECHNIQUE: Perfusion images were obtained in multiple projections after intravenous injection of radiopharmaceutical. Ventilation scans intentionally deferred if perfusion scan and chest x-ray adequate for interpretation during COVID 19 epidemic. RADIOPHARMACEUTICALS:  1.6 mCi Tc-53m MAA IV COMPARISON:  Chest x-ray from earlier in the same day. FINDINGS: Perfusion images demonstrate adequate uptake throughout both lungs despite the changes seen on recent chest x-ray. No focal defect to suggest pulmonary embolism is noted. IMPRESSION:  No evidence of pulmonary embolism. Electronically Signed   By: Inez Catalina M.D.   On: 01/16/2020 16:23   US RENAL  Result Date: 01/16/2020 CLINICAL DATA:  Acute renal insufficiency. EXAM: RENAL / URINARY TRACT ULTRASOUND COMPLETE COMPARISON:  None. FINDINGS: Exam difficult due to patient body habitus. Right Kidney: Renal measurements: 9.4 x 4.2 x 4.5 cm = volume: 94 mL . Echogenicity within normal limits. Mild cortical thinning. No mass or hydronephrosis visualized. Left Kidney: Renal measurements: 10.1 x 5.2 x 5.5 cm = volume: 150 mL. Echogenicity within normal limits. Mild cortical thinning. No mass or hydronephrosis visualized. Bladder: Not visualized. Other: None. IMPRESSION: Normal size kidneys without hydronephrosis. Electronically Signed   By: Marin Olp M.D.   On: 01/16/2020 16:38   US RENAL  Result Date: 01/06/2020 CLINICAL DATA:  Chronic kidney disease EXAM: RENAL / URINARY TRACT ULTRASOUND COMPLETE COMPARISON:  None. FINDINGS: Right Kidney: Renal measurements: 9.3 x 4.3 x 4.6 cm = volume: 96 mL. Lobulated contour. No  hydronephrosis. Normal echogenicity. Left Kidney: Renal measurements: 10.7 x 5.0 x 5.2 cm = volume: 145 mL. Lobulated contour without hydronephrosis. Normal echogenicity. Bladder: Decompressed. Other: Visualization is generally limited by body habitus. IMPRESSION: No hydronephrosis. Lobulated renal contours without focal parenchymal abnormality. Electronically Signed   By: Ulyses Jarred M.D.   On: 01/06/2020 16:16   PERIPHERAL VASCULAR CATHETERIZATION  Result Date: 01/08/2020 Patient name: Aamori Nicklin   MRN: [id number]        DOB: May 06, 1956            Sex: female  01/08/2020 Pre-operative Diagnosis: Critical limb ischemia of the left lower extremity with tissue loss Post-operative diagnosis:  Same Surgeon:  Marty Heck, MD Procedure Performed: 1.  Ultrasound guided access of the right common femoral artery 2.  Aortogram with CO2 including catheter selection of the aorta 3.  Left lower extremity arteriogram with selection of third order branches 4.  Left distal SFA above-knee popliteal artery angioplasty with stent placement (primarily stented with a 6 mm x 60 mm drug-coated Eluvia postdilated with a 5 mm Mustang) 5.  Left anterior tibial angioplasty for focal perforation with resolution of the perforation (3 mm Sterling) 6.  Mynx closure of the right common femoral artery 7.  55 minutes of monitored moderate conscious sedation time   Indications: Patient is a 64 year old female who was seen in consultation by Dr. Trula Slade for left lower extremity rest pain with severely depressed ABIs and duplex showing likely popliteal disease.  She presents today for left lower extremity arteriogram after risk benefits discussed.  Findings:  Aortogram was performed with CO2 to limit contrast and there was no flow-limiting aortoiliac stenosis.  Left lower extremity arteriogram showed a patent common femoral profunda and widely patent SFA in the proximal to mid segment.  At [facility] canal in the distal SFA and  above-knee popliteal artery showed a very short focal occlusion with large collateral around this.  She had a patent below-knee popliteal artery with three-vessel runoff.  Anterior tibial appeared to be dominant runoff.   Ultimately this lesion was crossed in the distal SFA/above knee popliteal artery and primarily stented with a 6 mm x 60 mm Eluvia.  She now has excellent inline flow down the left lower extremity with no residual stenosis and three-vessel runoff.  There was a focal perforation in the anterior tibial where our wire likely made a perforation during exchange of balloons and stents.  Ultimately this was treated with a very low inflation of  a 3 mm Sterling for 3 minutes with resolution.  Patient has preserved three vessel runoff.              Procedure:  The patient was identified in the holding area and taken to room 8.  The patient was then placed supine on the table and prepped and draped in the usual sterile fashion.  A time out was called.  Ultrasound was used to evaluate the right common femoral artery.  It was patent .  A digital ultrasound image was acquired.  A micropuncture needle was used to access the right common femoral artery under ultrasound guidance.  An 018 wire was advanced without resistance and a micropuncture sheath was placed.  The 018 wire was removed and a benson wire was placed.  The micropuncture sheath was exchanged for a 5 french sheath.  An omniflush catheter was advanced over the wire to the level of L-1.  An abdominal angiogram was obtained.  Next, using glidewire and straight 4 French flush catheter the aortic bifurcation was cross because the Omni would not track.  Placed the catheter into theleft external iliac artery and left runoff was obtained.  Ultimately after evaluating the pictures elected to intervene on the distal left SFA above-knee popliteal occlusion.  Used a long Glidewire advantage down the SFA and exchanged for a 6 [place] Ansell sheath in the right  groin over the aortic bifurcation.  Patient was given 100 units per kilogram heparin.  I then primarily stented this lesion with a 6 mm x 60 mm Eluvia that was postdilated with a 5 mm x 60 mm Mustang.  Final pictures showed no residual stenosis with excellent inline flow down the left lower extremity and three-vessel runoff.  There was a focal perforation in the proximal anterior tibial that I think was from our wire.  I then exchanged for a V 18 wire down the anterior tibial and then did a very slow inflation with a 3 mm Sterling in the left anterior tibial artery for 3 minutes.  There appeared to be resolution of the perforation after taking multiple additional pictures.  Wires and catheters removed and a short 6 French sheath was placed in the right groin.  We placed a mynx closure device.   Plan: Patient will be loaded on Plavix and will need to continue aspirin and Plavix.  Should have inline flow down left leg now.   Marty Heck, MD [facility] Office: [phone]   DG Chest Port 1 View  Result Date: 01/26/2020 CLINICAL DATA:  Shortness of breath.  COVID-19 positive EXAM: PORTABLE CHEST 1 VIEW COMPARISON:  Jan 24, 2020 FINDINGS: There is diffuse airspace interstitial opacity throughout the lungs, slightly increased overall on the left and stable on the right. Heart is enlarged with pulmonary venous hypertension, stable. No adenopathy appreciable. There is aortic atherosclerosis. Postoperative change noted in the lower cervical spine. IMPRESSION: Extensive opacity bilaterally, stable on the right and slightly increased on the left. Suspect multifocal pneumonia. There may well be a degree of underlying edema and/or ARDS. More than one of these entities may be present concurrently. Stable cardiac prominence. No adenopathy. Aortic Atherosclerosis (ICD10-I70.0). Electronically Signed   By: Lowella Grip III M.D.   On: 01/26/2020 14:10   DG Chest Port 1V same  Day  Result Date: 01/24/2020 CLINICAL DATA:  COVID-19 positive.  Respiratory distress EXAM: PORTABLE CHEST 1 VIEW COMPARISON:  January 16, 2020 FINDINGS: There is extensive interstitial and airspace  opacity throughout the lungs bilaterally with increase in consolidation in the bases compared to most recent study. There is cardiomegaly with pulmonary venous hypertension. No adenopathy. There is aortic atherosclerosis. Postoperative change noted in the lower cervical spine. IMPRESSION: Widespread interstitial and airspace opacity bilaterally. Cardiomegaly with pulmonary vascular congestion. Suspect a degree of congestive heart failure with multifocal pneumonia superimposed. Increased opacity in the lung bases compared to prior study. Aortic Atherosclerosis (ICD10-I70.0). Electronically Signed   By: Lowella Grip III M.D.   On: 01/24/2020 10:33   DG Chest Port 1V same Day  Result Date: 01/16/2020 CLINICAL DATA:  Shortness of breath.  COVID. EXAM: PORTABLE CHEST 1 VIEW COMPARISON:  Three days ago FINDINGS: More confluent bilateral interstitial and airspace opacity. Cardiomegaly. No visible effusion or pneumothorax. IMPRESSION: Worsening bilateral pneumonia. Electronically Signed   By: Monte Fantasia M.D.   On: 01/16/2020 08:34   DG Foot Complete Left  Result Date: 01/05/2020 CLINICAL DATA:  Swelling EXAM: LEFT FOOT - COMPLETE 3+ VIEW COMPARISON:  None. FINDINGS: Degenerative changes in the 1st MTP joint. No acute bony abnormality. Specifically, no fracture, subluxation, or dislocation. Vascular calcifications noted. No bone destruction seen to suggest osteomyelitis. IMPRESSION: No acute bony abnormality. Electronically Signed   By: Rolm Baptise M.D.   On: 01/05/2020 01:25   VAS [place] ABI WITH/WO TBI  Result Date: 01/05/2020 LOWER EXTREMITY DOPPLER STUDY Indications: Blue, cold toes. High Risk Factors: Hypertension, Diabetes.  Limitations: Today's exam was limited due to Patient body habitus. Comparison  Study: No prior studies. Performing Technologist: Carlos Levering Rvt  Examination Guidelines: A complete evaluation includes at minimum, Doppler waveform signals and systolic blood pressure reading at the level of bilateral brachial, anterior tibial, and posterior tibial arteries, when vessel segments are accessible. Bilateral testing is considered an integral part of a complete examination. Photoelectric Plethysmograph (PPG) waveforms and toe systolic pressure readings are included as required and additional duplex testing as needed. Limited examinations for reoccurring indications may be performed as noted.  ABI Findings: +---------+------------------+-----+---------+--------+ Right    Rt Pressure (mmHg)IndexWaveform Comment  +---------+------------------+-----+---------+--------+ Brachial 150                    triphasic         +---------+------------------+-----+---------+--------+ PTA      108               0.72 biphasic          +---------+------------------+-----+---------+--------+ DP       137               0.91 triphasic         +---------+------------------+-----+---------+--------+ Great Toe76                0.51                   +---------+------------------+-----+---------+--------+ +---------+------------------+-----+----------+-------+ Left     Lt Pressure (mmHg)IndexWaveform  Comment +---------+------------------+-----+----------+-------+ Brachial 136                    triphasic         +---------+------------------+-----+----------+-------+ PTA      66                0.44 monophasic        +---------+------------------+-----+----------+-------+ DP       53                0.35 monophasic        +---------+------------------+-----+----------+-------+ Rella Larve  0.00                   +---------+------------------+-----+----------+-------+ +-------+-----------+-----------+------------+------------+ ABI/TBIToday's  ABIToday's TBIPrevious ABIPrevious TBI +-------+-----------+-----------+------------+------------+ Right  0.91       0.51                                +-------+-----------+-----------+------------+------------+ Left   0.44       0                                   +-------+-----------+-----------+------------+------------+  Summary: Right: Resting right ankle-brachial index indicates mild right lower extremity arterial disease. The right toe-brachial index is abnormal. Left: Resting left ankle-brachial index indicates severe left lower extremity arterial disease. The left toe-brachial index is abnormal.  *See table(s) above for measurements and observations.  Electronically signed by [name] MD on 01/05/2020 at 7:35:40 PM.    Final    VAS [place] LOWER EXTREMITY ARTERIAL DUPLEX  Result Date: 01/05/2020 LOWER EXTREMITY ARTERIAL DUPLEX STUDY Indications: Blue, cold toes. High Risk Factors: Hypertension, Diabetes.  Current ABI: R 0.91 L 0.44 Limitations: Patient body habitus, poor ultrasound/tissue interface. Comparison Study: No prior studies. Performing Technologist: Oliver Hum RVT  Examination Guidelines: A complete evaluation includes B-mode imaging, spectral Doppler, color Doppler, and power Doppler as needed of all accessible portions of each vessel. Bilateral testing is considered an integral part of a complete examination. Limited examinations for reoccurring indications may be performed as noted.  +-----------+--------+-----+--------+----------+------------------+ LEFT       PSV cm/sRatioStenosisWaveform  Comments           +-----------+--------+-----+--------+----------+------------------+ EIA Distal 116                  biphasic                     +-----------+--------+-----+--------+----------+------------------+ CFA Mid    132                  biphasic                     +-----------+--------+-----+--------+----------+------------------+ DFA        111                   biphasic                     +-----------+--------+-----+--------+----------+------------------+ SFA Prox   104                  biphasic                     +-----------+--------+-----+--------+----------+------------------+ SFA Mid    85                   biphasic                     +-----------+--------+-----+--------+----------+------------------+ SFA Distal 91                   biphasic                     +-----------+--------+-----+--------+----------+------------------+ POP Prox   26                   monophasic                   +-----------+--------+-----+--------+----------+------------------+  POP Distal 0                              Unable to insonate +-----------+--------+-----+--------+----------+------------------+ ATA Prox   73                   biphasic                     +-----------+--------+-----+--------+----------+------------------+ ATA Mid    33                   monophasic                   +-----------+--------+-----+--------+----------+------------------+ ATA Distal 32                   monophasic                   +-----------+--------+-----+--------+----------+------------------+ PTA Prox                                  Unable to insonate +-----------+--------+-----+--------+----------+------------------+ PTA Mid    27                   monophasic                   +-----------+--------+-----+--------+----------+------------------+ PTA Distal 9                    monophasic                   +-----------+--------+-----+--------+----------+------------------+ PERO Prox                                 Unable to insonate +-----------+--------+-----+--------+----------+------------------+ PERO Mid                                  Unable to insonate +-----------+--------+-----+--------+----------+------------------+ PERO Distal15                   monophasic                    +-----------+--------+-----+--------+----------+------------------+ DP         29                   monophasic                   +-----------+--------+-----+--------+----------+------------------+  Summary: Left: The external iliac, common femoral, and superficial femoral arteries appear patent with no significant stenosis. Monophasic waveforms are noted in the proximal popliteal artery with poor visualization of the distal segment. Which suggests a possible occlusion. Anterior tibial artery appears patent throughout with monophasic flow. The posterior tibial, and peroneal arteries demonstrated monophasic flow with likely areas of occlusive disease. The proximal calf arteries were difficult to visualize.  See table(s) above for measurements and observations. Electronically signed by [name] MD on 01/05/2020 at 85:34:41 PM.    Final    VAS [place] LOWER EXTREMITY VENOUS (DVT)  Result Date: 01/13/2020  Lower Venous DVTStudy Indications: Swelling.  Limitations: Body habitus and poor ultrasound/tissue interface. Performing Technologist: Antonieta Pert RDMS, RVT  Examination Guidelines: A complete evaluation includes B-mode imaging, spectral Doppler, color Doppler, and power Doppler  as needed of all accessible portions of each vessel. Bilateral testing is considered an integral part of a complete examination. Limited examinations for reoccurring indications may be performed as noted. The reflux portion of the exam is performed with the patient in reverse Trendelenburg.  +---------+---------------+---------+-----------+----------+-------------------+ RIGHT    CompressibilityPhasicitySpontaneityPropertiesThrombus Aging      +---------+---------------+---------+-----------+----------+-------------------+ CFV      Partial        Yes      Yes                  patient body                                                              habitus and                                                                position limiting                                                         evaluation          +---------+---------------+---------+-----------+----------+-------------------+ SFJ      Full                                                             +---------+---------------+---------+-----------+----------+-------------------+ FV Prox                          Yes                                      +---------+---------------+---------+-----------+----------+-------------------+ FV Mid                           Yes                                      +---------+---------------+---------+-----------+----------+-------------------+ FV Distal                        Yes                                      +---------+---------------+---------+-----------+----------+-------------------+ PFV      Full                                                             +---------+---------------+---------+-----------+----------+-------------------+  POP      Full                                                             +---------+---------------+---------+-----------+----------+-------------------+ PTV                                                   not clearly                                                               visualized          +---------+---------------+---------+-----------+----------+-------------------+ PERO                                                  not clearly                                                               visualized          +---------+---------------+---------+-----------+----------+-------------------+   +---------+---------------+---------+-----------+----------+------------------+ LEFT     CompressibilityPhasicitySpontaneityPropertiesThrombus Aging     +---------+---------------+---------+-----------+----------+------------------+ CFV      Full           Yes      Yes                                      +---------+---------------+---------+-----------+----------+------------------+ SFJ      Full                                                            +---------+---------------+---------+-----------+----------+------------------+ FV Prox  Full                                                            +---------+---------------+---------+-----------+----------+------------------+ FV Mid   Full                                                            +---------+---------------+---------+-----------+----------+------------------+ FV Distal  Yes                                     +---------+---------------+---------+-----------+----------+------------------+ PFV      Full                                                            +---------+---------------+---------+-----------+----------+------------------+ POP      Full           Yes      Yes                                     +---------+---------------+---------+-----------+----------+------------------+ PTV                                                   not clearly                                                              visualized         +---------+---------------+---------+-----------+----------+------------------+ PERO                                                  not clearly                                                              visualized         +---------+---------------+---------+-----------+----------+------------------+    Summary: RIGHT: - There is no evidence of deep vein thrombosis in the lower extremity. However, portions of this examination were limited- see technologist comments above.  LEFT: - There is no evidence of deep vein thrombosis in the lower extremity. However, portions of this examination were limited- see technologist comments above.  - Prominent lymph nodes noted bilateral groin areas.   *See table(s) above for measurements and observations. Electronically signed by Deitra Mayo MD on 01/13/2020 at 6:50:27 PM.    Final    ECHOCARDIOGRAM LIMITED  Result Date: 01/15/2020    ECHOCARDIOGRAM LIMITED REPORT   Patient Name:   KAYLI MILESKI Date of Exam: 01/15/2020 Medical Rec #:  [id number]     Height:       61.0 in Accession #:    [id number]    Weight:       249.3 lb Date of Birth:  [birth year]      BSA:          2.074 m Patient Age:    25 years      BP:  131/70 mmHg Patient Gender: F             HR:           53 bpm. Exam Location:  Inpatient Procedure: Limited Echo, Limited Color Doppler and Cardiac Doppler Indications:    Acute Respiratory Insufficiency R06.89  History:        Patient has prior history of Echocardiogram examinations, most                 recent 12/02/2016. Risk Factors:Hypertension and Dyslipidemia.  Sonographer:    Mikki Santee RDCS (AE) Referring Phys: [place]  1. Left ventricular diastolic parameters are consistent with Grade II diastolic dysfunction (pseudonormalization).  2. Right ventricular systolic function is moderately reduced. There is severely elevated pulmonary artery systolic pressure.  3. The inferior vena cava is normal in size with greater than 50% respiratory variability, suggesting right atrial pressure of 3 mmHg. FINDINGS  Left Ventricle: Indeterminate filling pressures. Right Ventricle: Right ventricular systolic function is moderately reduced. There is severely elevated pulmonary artery systolic pressure. The tricuspid regurgitant velocity is 3.86 m/s, and with an assumed right atrial pressure of 3 mmHg, the estimated right ventricular systolic pressure is [id number] mmHg. Tricuspid Valve: Tricuspid valve regurgitation is mild. Venous: The inferior vena cava is normal in size with greater than 50% respiratory variability, suggesting right atrial pressure of 3 mmHg.  LEFT VENTRICLE PLAX 2D LVOT diam:     2.10 cm  Diastology  LV SV:         81       LV e' lateral:   5.61 cm/s LV SV Index:   39       LV E/e' lateral: 12.1 LVOT Area:     3.46 cm LV e' medial:    6.40 cm/s                         LV E/e' medial:  10.6  RIGHT VENTRICLE TAPSE (M-mode): 1.5 cm LEFT ATRIUM           Index       RIGHT ATRIUM           Index LA Vol (A4C): 37.4 ml 18.03 ml/m RA Area:     16.80 cm                                   RA Volume:   44.50 ml  21.45 ml/m  AORTIC VALVE LVOT Vmax:   98.60 cm/s LVOT Vmean:  69.800 cm/s LVOT VTI:    0.235 m MITRAL VALVE               TRICUSPID VALVE MV Area (PHT): 3.53 cm    TR Peak grad:   59.6 mmHg MV Decel Time: 215 msec    TR Vmax:        386.00 cm/s MV E velocity: 67.70 cm/s MV A velocity: 65.10 cm/s  SHUNTS MV E/A ratio:  1.04        Systemic VTI:  0.24 m                            Systemic Diam: 2.10 cm Skeet Latch MD Electronically signed by Skeet Latch MD Signature Date/Time: 01/15/2020/3:50:03 PM    Final

## 2020-01-28 ENCOUNTER — Inpatient Hospital Stay (HOSPITAL_COMMUNITY): Payer: BC Managed Care – PPO

## 2020-01-28 ENCOUNTER — Inpatient Hospital Stay: Payer: Self-pay

## 2020-01-28 DIAGNOSIS — J1282 Pneumonia due to coronavirus disease 2019: Secondary | ICD-10-CM

## 2020-01-28 DIAGNOSIS — I5033 Acute on chronic diastolic (congestive) heart failure: Secondary | ICD-10-CM

## 2020-01-28 DIAGNOSIS — J9601 Acute respiratory failure with hypoxia: Secondary | ICD-10-CM

## 2020-01-28 DIAGNOSIS — J9600 Acute respiratory failure, unspecified whether with hypoxia or hypercapnia: Secondary | ICD-10-CM

## 2020-01-28 DIAGNOSIS — L8990 Pressure ulcer of unspecified site, unspecified stage: Secondary | ICD-10-CM | POA: Insufficient documentation

## 2020-01-28 DIAGNOSIS — U071 COVID-19: Principal | ICD-10-CM

## 2020-01-28 DIAGNOSIS — J80 Acute respiratory distress syndrome: Secondary | ICD-10-CM

## 2020-01-28 LAB — CBC WITH DIFFERENTIAL/PLATELET
Abs Immature Granulocytes: 0.15 10*3/uL — ABNORMAL HIGH (ref 0.00–0.07)
Basophils Absolute: 0 10*3/uL (ref 0.0–0.1)
Basophils Relative: 0 %
Eosinophils Absolute: 0.2 10*3/uL (ref 0.0–0.5)
Eosinophils Relative: 2 %
HCT: 34.5 % — ABNORMAL LOW (ref 36.0–46.0)
Hemoglobin: 10.8 g/dL — ABNORMAL LOW (ref 12.0–15.0)
Immature Granulocytes: 1 %
Lymphocytes Relative: 4 %
Lymphs Abs: 0.5 10*3/uL — ABNORMAL LOW (ref 0.7–4.0)
MCH: 31.7 pg (ref 26.0–34.0)
MCHC: 31.3 g/dL (ref 30.0–36.0)
MCV: 101.2 fL — ABNORMAL HIGH (ref 80.0–100.0)
Monocytes Absolute: 0.4 10*3/uL (ref 0.1–1.0)
Monocytes Relative: 3 %
Neutro Abs: 12.2 10*3/uL — ABNORMAL HIGH (ref 1.7–7.7)
Neutrophils Relative %: 90 %
Platelets: 121 10*3/uL — ABNORMAL LOW (ref 150–400)
RBC: 3.41 MIL/uL — ABNORMAL LOW (ref 3.87–5.11)
RDW: 21.5 % — ABNORMAL HIGH (ref 11.5–15.5)
WBC: 13.4 10*3/uL — ABNORMAL HIGH (ref 4.0–10.5)
nRBC: 0.8 % — ABNORMAL HIGH (ref 0.0–0.2)

## 2020-01-28 LAB — COMPREHENSIVE METABOLIC PANEL
ALT: 57 U/L — ABNORMAL HIGH (ref 0–44)
AST: 57 U/L — ABNORMAL HIGH (ref 15–41)
Albumin: 2.7 g/dL — ABNORMAL LOW (ref 3.5–5.0)
Alkaline Phosphatase: 85 U/L (ref 38–126)
Anion gap: 11 (ref 5–15)
BUN: 39 mg/dL — ABNORMAL HIGH (ref 8–23)
CO2: 29 mmol/L (ref 22–32)
Calcium: 8.4 mg/dL — ABNORMAL LOW (ref 8.9–10.3)
Chloride: 100 mmol/L (ref 98–111)
Creatinine, Ser: 1.63 mg/dL — ABNORMAL HIGH (ref 0.44–1.00)
GFR calc Af Amer: 38 mL/min — ABNORMAL LOW (ref >60)
GFR calc non Af Amer: 33 mL/min — ABNORMAL LOW (ref >60)
Glucose, Bld: 277 mg/dL — ABNORMAL HIGH (ref 70–99)
Potassium: 4.1 mmol/L (ref 3.5–5.1)
Sodium: 140 mmol/L (ref 135–145)
Total Bilirubin: 1.5 mg/dL — ABNORMAL HIGH (ref 0.3–1.2)
Total Protein: 5.2 g/dL — ABNORMAL LOW (ref 6.5–8.1)

## 2020-01-28 LAB — POCT I-STAT 7, (LYTES, BLD GAS, ICA,H+H)
Acid-Base Excess: 7 mmol/L — ABNORMAL HIGH (ref 0.0–2.0)
Bicarbonate: 33.9 mmol/L — ABNORMAL HIGH (ref 20.0–28.0)
Calcium, Ion: 1.18 mmol/L (ref 1.15–1.40)
HCT: 34 % — ABNORMAL LOW (ref 36.0–46.0)
Hemoglobin: 11.6 g/dL — ABNORMAL LOW (ref 12.0–15.0)
O2 Saturation: 97 %
Patient temperature: 98.4
Potassium: 3.9 mmol/L (ref 3.5–5.1)
Sodium: 139 mmol/L (ref 135–145)
TCO2: 36 mmol/L — ABNORMAL HIGH (ref 22–32)
pCO2 arterial: 56.4 mmHg — ABNORMAL HIGH (ref 32.0–48.0)
pH, Arterial: 7.386 (ref 7.350–7.450)
pO2, Arterial: 96 mmHg (ref 83.0–108.0)

## 2020-01-28 LAB — TRIGLYCERIDES: Triglycerides: 146 mg/dL (ref <150)

## 2020-01-28 LAB — BRAIN NATRIURETIC PEPTIDE: B Natriuretic Peptide: 194.2 pg/mL — ABNORMAL HIGH (ref 0.0–100.0)

## 2020-01-28 LAB — GLUCOSE, CAPILLARY
Glucose-Capillary: 218 mg/dL — ABNORMAL HIGH (ref 70–99)
Glucose-Capillary: 220 mg/dL — ABNORMAL HIGH (ref 70–99)
Glucose-Capillary: 226 mg/dL — ABNORMAL HIGH (ref 70–99)
Glucose-Capillary: 241 mg/dL — ABNORMAL HIGH (ref 70–99)
Glucose-Capillary: 315 mg/dL — ABNORMAL HIGH (ref 70–99)
Glucose-Capillary: 361 mg/dL — ABNORMAL HIGH (ref 70–99)
Glucose-Capillary: 405 mg/dL — ABNORMAL HIGH (ref 70–99)

## 2020-01-28 LAB — MRSA PCR SCREENING: MRSA by PCR: NEGATIVE

## 2020-01-28 LAB — PHOSPHORUS: Phosphorus: 6.6 mg/dL — ABNORMAL HIGH (ref 2.5–4.6)

## 2020-01-28 LAB — D-DIMER, QUANTITATIVE: D-Dimer, Quant: 5.61 ug/mL-FEU — ABNORMAL HIGH (ref 0.00–0.50)

## 2020-01-28 LAB — MAGNESIUM: Magnesium: 2.8 mg/dL — ABNORMAL HIGH (ref 1.7–2.4)

## 2020-01-28 MED ORDER — ORAL CARE MOUTH RINSE
15.0000 mL | OROMUCOSAL | Status: DC
  Administered 2020-01-28 – 2020-02-06 (×92): 15 mL via OROMUCOSAL

## 2020-01-28 MED ORDER — SODIUM CHLORIDE 0.9 % IV SOLN
250.0000 mL | INTRAVENOUS | Status: DC
  Administered 2020-01-28: 250 mL via INTRAVENOUS

## 2020-01-28 MED ORDER — PANTOPRAZOLE SODIUM 40 MG IV SOLR
40.0000 mg | INTRAVENOUS | Status: DC
  Administered 2020-01-28 – 2020-02-03 (×7): 40 mg via INTRAVENOUS
  Filled 2020-01-28 (×7): qty 40

## 2020-01-28 MED ORDER — INSULIN ASPART 100 UNIT/ML ~~LOC~~ SOLN
0.0000 [IU] | SUBCUTANEOUS | Status: DC
  Administered 2020-01-28 (×4): 5 [IU] via SUBCUTANEOUS
  Administered 2020-01-28: 15 [IU] via SUBCUTANEOUS
  Administered 2020-01-28: 11 [IU] via SUBCUTANEOUS

## 2020-01-28 MED ORDER — DOCUSATE SODIUM 50 MG/5ML PO LIQD
100.0000 mg | Freq: Two times a day (BID) | ORAL | Status: DC
  Administered 2020-01-28 – 2020-01-29 (×4): 100 mg via ORAL
  Filled 2020-01-28 (×4): qty 10

## 2020-01-28 MED ORDER — MIDAZOLAM HCL 2 MG/2ML IJ SOLN: 2.0000 mg | Freq: Once | INTRAMUSCULAR | Status: AC

## 2020-01-28 MED ORDER — DEXTROSE-NACL 5-0.45 % IV SOLN: INTRAVENOUS | Status: DC

## 2020-01-28 MED ORDER — VITAL HIGH PROTEIN PO LIQD: 1000.0000 mL | ORAL | Status: DC

## 2020-01-28 MED ORDER — CHLORHEXIDINE GLUCONATE 0.12% ORAL RINSE (MEDLINE KIT)
15.0000 mL | Freq: Two times a day (BID) | OROMUCOSAL | Status: DC
  Administered 2020-01-28 – 2020-02-06 (×20): 15 mL via OROMUCOSAL

## 2020-01-28 MED ORDER — VANCOMYCIN HCL IN DEXTROSE 1-5 GM/200ML-% IV SOLN
1000.0000 mg | INTRAVENOUS | Status: DC
  Administered 2020-01-28: 1000 mg via INTRAVENOUS
  Filled 2020-01-28: qty 200

## 2020-01-28 MED ORDER — INSULIN ASPART 100 UNIT/ML ~~LOC~~ SOLN
3.0000 [IU] | SUBCUTANEOUS | Status: DC
  Administered 2020-01-28 (×3): 3 [IU] via SUBCUTANEOUS

## 2020-01-28 MED ORDER — SUCCINYLCHOLINE CHLORIDE 20 MG/ML IJ SOLN
100.0000 mg | Freq: Once | INTRAMUSCULAR | Status: AC
  Administered 2020-01-28: 100 mg via INTRAVENOUS
  Filled 2020-01-28: qty 5

## 2020-01-28 MED ORDER — MIDAZOLAM HCL 2 MG/2ML IJ SOLN
INTRAMUSCULAR | Status: AC
  Administered 2020-01-28: 2 mg via INTRAVENOUS
  Filled 2020-01-28: qty 4

## 2020-01-28 MED ORDER — POLYETHYLENE GLYCOL 3350 17 G PO PACK
17.0000 g | PACK | Freq: Every day | ORAL | Status: DC
  Administered 2020-01-28 – 2020-01-30 (×3): 17 g via ORAL
  Filled 2020-01-28 (×4): qty 1

## 2020-01-28 MED ORDER — FENTANYL CITRATE (PF) 100 MCG/2ML IJ SOLN: 50.0000 ug | INTRAMUSCULAR | Status: DC | PRN

## 2020-01-28 MED ORDER — MAGNESIUM SULFATE 2 GM/50ML IV SOLN
2.0000 g | Freq: Once | INTRAVENOUS | Status: AC
  Administered 2020-01-28: 2 g via INTRAVENOUS
  Filled 2020-01-28: qty 50

## 2020-01-28 MED ORDER — METHYLPREDNISOLONE SODIUM SUCC 125 MG IJ SOLR
60.0000 mg | Freq: Three times a day (TID) | INTRAMUSCULAR | Status: DC
  Administered 2020-01-28 – 2020-01-30 (×7): 60 mg via INTRAVENOUS
  Filled 2020-01-28 (×7): qty 2

## 2020-01-28 MED ORDER — NOREPINEPHRINE 4 MG/250ML-% IV SOLN
2.0000 ug/min | INTRAVENOUS | Status: DC
  Administered 2020-01-28: 6 ug/min via INTRAVENOUS
  Administered 2020-01-29: 4 ug/min via INTRAVENOUS
  Administered 2020-01-30: 6 ug/min via INTRAVENOUS
  Administered 2020-01-31: 7 ug/min via INTRAVENOUS
  Filled 2020-01-28 (×5): qty 250

## 2020-01-28 MED ORDER — PROPOFOL 1000 MG/100ML IV EMUL
5.0000 ug/kg/min | INTRAVENOUS | Status: DC
  Administered 2020-01-28: 55 ug/kg/min via INTRAVENOUS
  Administered 2020-01-28: 80 ug/kg/min via INTRAVENOUS
  Administered 2020-01-28: 70 ug/kg/min via INTRAVENOUS
  Administered 2020-01-28 (×2): 80 ug/kg/min via INTRAVENOUS
  Administered 2020-01-28: 70 ug/kg/min via INTRAVENOUS
  Administered 2020-01-28: 80 ug/kg/min via INTRAVENOUS
  Administered 2020-01-28: 40 ug/kg/min via INTRAVENOUS
  Administered 2020-01-29 (×3): 70 ug/kg/min via INTRAVENOUS
  Administered 2020-01-29: 80 ug/kg/min via INTRAVENOUS
  Filled 2020-01-28 (×5): qty 100
  Filled 2020-01-28: qty 200
  Filled 2020-01-28 (×5): qty 100

## 2020-01-28 MED ORDER — FENTANYL CITRATE (PF) 100 MCG/2ML IJ SOLN: 100.0000 ug | Freq: Once | INTRAMUSCULAR | Status: AC

## 2020-01-28 MED ORDER — PRO-STAT SUGAR FREE PO LIQD
30.0000 mL | Freq: Four times a day (QID) | ORAL | Status: DC
  Administered 2020-01-28 – 2020-02-05 (×32): 30 mL
  Filled 2020-01-28 (×30): qty 30

## 2020-01-28 MED ORDER — POTASSIUM CHLORIDE 20 MEQ/15ML (10%) PO SOLN
40.0000 meq | Freq: Once | ORAL | Status: AC
  Administered 2020-01-28: 40 meq
  Filled 2020-01-28: qty 30

## 2020-01-28 MED ORDER — SODIUM CHLORIDE 0.9% FLUSH
10.0000 mL | INTRAVENOUS | Status: DC | PRN
  Administered 2020-02-01: 10 mL

## 2020-01-28 MED ORDER — PRO-STAT SUGAR FREE PO LIQD
30.0000 mL | Freq: Two times a day (BID) | ORAL | Status: DC
  Administered 2020-01-28: 30 mL

## 2020-01-28 MED ORDER — DEXTROSE 50 % IV SOLN: 0.0000 mL | INTRAVENOUS | Status: DC | PRN

## 2020-01-28 MED ORDER — MIDAZOLAM HCL 2 MG/2ML IJ SOLN
2.0000 mg | Freq: Once | INTRAMUSCULAR | Status: AC
  Administered 2020-01-28: 2 mg via INTRAVENOUS

## 2020-01-28 MED ORDER — VITAL 1.5 CAL PO LIQD
1000.0000 mL | ORAL | Status: DC
  Filled 2020-01-28: qty 1000

## 2020-01-28 MED ORDER — SODIUM CHLORIDE 0.9 % IV SOLN: INTRAVENOUS | Status: DC

## 2020-01-28 MED ORDER — FENTANYL CITRATE (PF) 100 MCG/2ML IJ SOLN
INTRAMUSCULAR | Status: AC
  Administered 2020-01-28: 100 ug via INTRAVENOUS
  Filled 2020-01-28: qty 4

## 2020-01-28 MED ORDER — IPRATROPIUM-ALBUTEROL 0.5-2.5 (3) MG/3ML IN SOLN
3.0000 mL | Freq: Four times a day (QID) | RESPIRATORY_TRACT | Status: DC
  Administered 2020-01-28 – 2020-01-29 (×6): 3 mL via RESPIRATORY_TRACT
  Filled 2020-01-28 (×6): qty 3

## 2020-01-28 MED ORDER — LINEZOLID 600 MG/300ML IV SOLN
600.0000 mg | Freq: Two times a day (BID) | INTRAVENOUS | Status: DC
  Administered 2020-01-28 – 2020-01-29 (×4): 600 mg via INTRAVENOUS
  Filled 2020-01-28 (×5): qty 300

## 2020-01-28 MED ORDER — ETOMIDATE 2 MG/ML IV SOLN
20.0000 mg | Freq: Once | INTRAVENOUS | Status: AC
  Administered 2020-01-28: 20 mg via INTRAVENOUS

## 2020-01-28 MED ORDER — FUROSEMIDE 10 MG/ML IJ SOLN: 40.0000 mg | Freq: Four times a day (QID) | INTRAMUSCULAR | Status: DC

## 2020-01-28 MED ORDER — SODIUM CHLORIDE 0.9 % IV SOLN
2.0000 g | Freq: Two times a day (BID) | INTRAVENOUS | Status: DC
  Administered 2020-01-28 – 2020-01-29 (×5): 2 g via INTRAVENOUS
  Filled 2020-01-28 (×5): qty 2

## 2020-01-28 MED ORDER — VITAL 1.5 CAL PO LIQD
1000.0000 mL | ORAL | Status: DC
  Administered 2020-01-28 – 2020-02-04 (×7): 1000 mL
  Filled 2020-01-28 (×10): qty 1000

## 2020-01-28 MED ORDER — PROPOFOL 1000 MG/100ML IV EMUL
INTRAVENOUS | Status: AC
  Administered 2020-01-28: 30 ug/kg/min via INTRAVENOUS
  Filled 2020-01-28: qty 100

## 2020-01-28 MED ORDER — INSULIN REGULAR(HUMAN) IN NACL 100-0.9 UT/100ML-% IV SOLN
INTRAVENOUS | Status: AC
  Administered 2020-01-29: 17 [IU]/h via INTRAVENOUS
  Filled 2020-01-28 (×2): qty 100

## 2020-01-28 NOTE — Progress Notes (Signed)
RN spoke with Dr. Ronnette Juniper and made him aware that patient's O2 sats keeps dropping to mid to low 80s, breath sounds unequal, she's currently tachypneic and belly breathing/double stacking her breaths. RN has also started her levophed as of 1915 and currently at 8 mcg.   Dr. Ronnette Juniper placed orders for STAT chest x-ray.

## 2020-01-28 NOTE — Progress Notes (Signed)
eLink Physician-Brief Progress Note Patient Name: [name] DOB: 09-16-1956 MRN: [id number]   Date of Service  01/28/2020  HPI/Events of Note  Ventilator asynchrony - Hx of mediastinal emphysema.   eICU Interventions  Will order: 1. Portable CXR now to r/o pneumothorax.      Intervention Category Major Interventions: Respiratory failure - evaluation and management;Hypoxemia - evaluation and management  Lysle Dingwall 01/28/2020, 7:43 PM

## 2020-01-28 NOTE — Progress Notes (Addendum)
Initial Nutrition Assessment  DOCUMENTATION CODES:   Obesity unspecified  INTERVENTION:   Tube Feeding:  Vital 1.5 at 50 ml/hr Pro-Stat 30 mL QID Provides 2200 kcals, 141 g of protein and 912 mL of free water  TF regimen and propofol at current rate providing 2873 total kcal/day    NUTRITION DIAGNOSIS:   Inadequate oral intake related to acute illness as evidenced by NPO status.  GOAL:   Patient will meet greater than or equal to 90% of their needs  MONITOR:   Vent status, Labs, Weight trends, TF tolerance  REASON FOR ASSESSMENT:   Consult, Ventilator Enteral/tube feeding initiation and management  ASSESSMENT:   64 yo female admitted with acute progressive respiratory failure with COVID-19 pneumonia which ultimately required intubation after 2 week LOS, acute on chronic CHF. PMH includes CHF, DM, depression, HLD, HTN, CKD, COPD  RD working remotely.  4/26 Admitted 5/12 Intubated  Patient is currently intubated on ventilator support MV: 9.6 L/min Temp (24hrs), Avg:97.8 F (36.6 C), Min:96.3 F (35.7 C), Max:98.4 F (36.9 C)  Propofol: 25.5 ml/hr  Current weight 99.5 kg; admit weight 109.8 kg  Unable to obtain diet and weight history from patient at this time  Recorded po intake 20-60% prior to intubation; average <50% (around 35%). PO intake while in hospital has been inadequate  Labs: CBGs 218-241 Meds: solumedrol, mag sulfate, zinc sulfate, KCl, lasix, ss novolog, novolog q 4 hours, ascorbic acid   Diet Order:   Diet Order            Diet NPO time specified  Diet effective now              EDUCATION NEEDS:   Not appropriate for education at this time  Skin:  Skin Assessment: Reviewed RN Assessment  Last BM:  5/12  Height:   Ht Readings from Last 1 Encounters:  01/28/20 5\' 3"  (1.6 m)    Weight:   Wt Readings from Last 1 Encounters:  01/28/20 99.5 kg    BMI:  Body mass index is 38.86 kg/m.  Estimated Nutritional Needs:    Kcal:  [id number] kcals  Protein:  125-150 g  Fluid:  >/= 2 L   Kerman Passey MS, RDN, LDN, CNSC RD Pager Number and RD On-Call Pager Number Located in [place]

## 2020-01-28 NOTE — Procedures (Signed)
Cortrak  Person Inserting Tube:  Rosezetta Schlatter, RD Tube Type:  Cortrak - 43 inches Tube Location:  Left nare Initial Placement:  Stomach Secured by: Bridle Technique Used to Measure Tube Placement:  Documented cm marking at nare/ corner of mouth Cortrak Secured At:  69 cm Procedure Comments:  Cortrak Tube Team Note:  Consult received to place a Cortrak feeding tube.   No x-ray is required. RN may begin using tube.   If the tube becomes dislodged please keep the tube and contact the Cortrak team at [email or web address] ([email or web address]) for replacement.  If after hours and replacement cannot be delayed, place a NG tube and confirm placement with an abdominal x-ray.      Jarome Matin, MS, RD, LDN, CNSC Inpatient Clinical Dietitian RD pager # available in [place]  After hours/weekend pager # available in [facility]

## 2020-01-28 NOTE — Procedures (Signed)
Intubation Procedure Note Lyricc Borra [id number] [birth year]  Procedure: Intubation Indications: Respiratory insufficiency  Procedure Details Consent: Unable to obtain consent because of emergent medical necessity. Time Out: Verified patient identification, verified procedure, site/side was marked, verified correct patient position, special equipment/implants available, medications/allergies/relevent history reviewed, required imaging and test results available.  Performed  Maximum sterile technique was used including cap, gloves, gown, hand hygiene and mask.  3    Evaluation Hemodynamic Status: BP stable  Due to biting on et tube, gave 100 mg succinyl choline; O2 sats: transiently dropped due to tube bitintg Patient's Current Condition: stable Complications: No apparent complications Patient did tolerate procedure well. Chest X-ray ordered to verify placement.  [id number].   Shellia Cleverly 01/28/2020

## 2020-01-28 NOTE — Progress Notes (Signed)
Transferred to 17M/ICU bed 7 at 0035 via bed.

## 2020-01-28 NOTE — Progress Notes (Signed)
eLink Physician-Brief Progress Note Patient Name: [name] DOB: 16-Dec-1955 MRN: [id number]   Date of Service  01/28/2020  HPI/Events of Note  CXR reveals severe COVID pneumonia and pneumomediastinum. No pneumothorax.  eICU Interventions  Plan: 1. Watch clinically for evidence of pneumothorax (hypoxia or increased O2 requirements). 2. Serial portable CXR at 9 AM.      Intervention Category Major Interventions: Other:  Lysle Dingwall 01/28/2020, 2:37 AM

## 2020-01-28 NOTE — Progress Notes (Signed)
Pharmacy Antibiotic Note  [name] is a 64 y.o. female admitted on 12/18/2019 with acute respiratory failure secondary to COVID-19.  Pharmacy has been consulted for Vancomycin/Cefepime dosing. Pt has been here for a few weeks. Last night she had increased work of breathing requiring intubation and transfer to ICU. WBC elevated, likely some part infection and some part steroids, as it has been decreasing since steroids were stopped. CXR does reveal extensive bilateral pneumonia.   Plan: Vancomycin 1000 mg IV q24h  >>Estimated AUC: 486 Cefepime 2g IV q12h Trend WBC, temp, renal function  F/U infectious work-up Drug levels as indicated  Height: 5\' 3"  (160 cm) Weight: 103.5 kg (228 lb 2.8 oz) IBW/kg (Calculated) : 52.4  Temp (24hrs), Avg:98.1 F (36.7 C), Min:97.6 F (36.4 C), Max:98.4 F (36.9 C)  Recent Labs  Lab 01/22/20 1042 01/23/20 0721 01/23/20 1012 01/24/20 0812 01/25/20 0800 01/26/20 0353 01/27/20 0427  WBC  --  39.5*  --  38.5* 31.6* 27.1* 15.3*  CREATININE   < >  --  1.84* 1.98* 1.74* 1.73* 1.54*   < > = values in this interval not displayed.    Estimated Creatinine Clearance: 43 mL/min (A) (by C-G formula based on SCr of 1.54 mg/dL (H)).    Allergies  Allergen Reactions  . Pork Allergy   . Pork-Derived Products Nausea And Vomiting   Narda Bonds, PharmD, BCPS Clinical Pharmacist Phone: [phone]

## 2020-01-28 NOTE — Progress Notes (Addendum)
NAME:  [name], MRN:  [id number], DOB:  1955-11-27, LOS: 37 ADMISSION DATE:  12/23/2019, CONSULTATION DATE:  5/11 REFERRING MD:  Dr Candiss Norse, CHIEF COMPLAINT:  Hypoxia   Brief History   64 year old female admitted 4/26 for COVID pneumonia and CHF. Initially improved with the usual treatment. Was on room air. 5/8 developed worsening hypoxia that was progressive despite diuresis. As of 5/11 required NRB and HFNC at 35LPM and became lethargic. PCCM consulted.     Past Medical History   has a past medical history of Achilles rupture, Anxiety, Arthritis, Asthma, Chronic back pain, Chronic diastolic heart failure ([place]) (11/30/2016), Chronic headaches, Chronic leg pain, CHRONIC OBSTRUCTIVE PULMONARY DISEASE, MODERATE (03/26/2010), Depression, Diabetes mellitus (1999), Glaucoma, Gout, H/O gestational diabetes mellitus, not currently pregnant, Hyperlipidemia, Hypertension, Morbid obesity ([place]), Obstructive sleep apnea on CPAP (07/02/2009), OSA (obstructive sleep apnea), and Sciatica.   Significant Hospital Events   4/18-4/23>> admit to [facility]-s/p stent to left distal SFA 4/27>> admit to [facility] for hypoxia from Covid/heart failure 4/27>> lower extremity Dopplers negative for DVT. [id number] grade 2 diastolic dysfunction, moderate RV systolic dysfunction.  RVSP 62.6 mmHg. 4/30>> VQ scan-low probability PE 5/5>> improved-on room air 5/8>> worsening hypoxemia-on 15 L of oxygen-with dramatic response to Lasix-down to 4 L 5/10 -5/12 once again requiring oxygen. Had been down to room air prior to this. Oxygen requirements progressed to point where she required intubation. 5/10 weight was up significantly. 106.4 to 109.7 Kg. 5/12 intubated. ABX started. Lasix escalated. LE [place] repeated and was placed on therapeutic LMWH. Post intubation film w/ small pneumomediastinum. Tube feeds started. Pulse steroids started Consults:    Procedures:  5/12 ETT  Significant Diagnostic Tests:  Lower extremity doppler 4/27 > no  evidence of DVT Echo 4/29 > Grade II DD, moderate reduction of RV systolic function.  V/Q 4/30 > No evidence PE US renal 4/30 > normal size kidneys, no hydro.   Micro Data:  COVID pos 4/27 > Flu neg 4/27 > Blood cx neg 4/28 >  Antimicrobials:  Cefepime 5/12 > Vancomycin  5/12 >zyvox (changed due to cr)>>>  Interim history/subjective:  Agitated   Objective   Blood pressure (Abnormal) 105/58, pulse (Abnormal) 51, temperature (Abnormal) 96.3 F (35.7 C), temperature source Axillary, resp. rate (Abnormal) 28, height 5\' 3"  (1.6 m), weight 99.5 kg, last menstrual period 02/16/2017, SpO2 99 %.    Vent Mode: PRVC FiO2 (%):  [60 %-100 %] 80 % Set Rate:  [23 bmp] 23 bmp Vt Set:  [320 mL] 320 mL PEEP:  [10 cmH20] 10 cmH20 Plateau Pressure:  [24 cmH20] 24 cmH20   Intake/Output Summary (Last 24 hours) at 01/28/2020 0830 Last data filed at 01/28/2020 0600 Gross per 24 hour  Intake 761.53 ml  Output 875 ml  Net -113.47 ml   Filed Weights   01/26/20 0448 01/27/20 0551 01/28/20 0500  Weight: 109.7 kg 103.5 kg 99.5 kg    Examination:  General obese 64 year old black female. Sedated but currently agitated HENT NCAT no JVD MMM Pulm RR in high 30s, + air trapping. Not able to calc his Pplat Card RRR (bradycardic) abd not tender + bowel sounds EXT warm trace LE edema Neuro agitated  Resolved Hospital Problem list     Assessment & Plan:   Acute hypoxemic respiratory failure:  COVID 19 pneumonia: positive test 4/27. S/p Remdesivir, decadron, actemra Acute on chronic HFpEF  COPD +/- acute exacerbation pcxr personally reviewed: ett good position. Diffuse bilateral airspace disease. There is  what appears to be lucency at left HB c/w pneumomediastinum  More inclined to think this was Hf w/ pulm edema  Pneumomediastinum  Plan Cont full vent support w/ low Vt ventilation Cont to wean PEEP/FIO2 for Pao2 >55 sats >88 PIP goal <30 driving pressure goal < 15 Will cont to push lasix (aim  for negative volume status) Day 1 vanc and cefepime-> cultures sent Cont solumedrol 60 q 6 for now, cont BDs VAP bundle PAD protocol  Cont therapeutic LMWH for now  Repeat CXR later this afternoon,.   Bradycardia -2/2 propofol Plan Cont tele No need to treat this unless we have hypotension we can't treat Would add low dose norepi if needed.   CKD stage 3b Looks like baseline cr ~1.5 to 2.5 even dating back 2 yrs ago. Currently at baseline  Plan Cont diuretics as BP,cr allow Strict I&O Renal adjust meds  OSA: non-complaint with CPAP, refused inpatient as well.  Plan Supportive care Will need f/u but given noncompliance may be little to offer   DM w/ labile glycemic control (drops in am) Plan Cont ssi Add scheduled aspart 3 units q4   Elevated D Dimer: likely due to [place]. VTE essentially ruled out dopplers and VQ scan completed on 4/30; no sig bump on indices.   Plan Therapeutic LMWH for now and await LE [place]  PAD: s/p recent stent to LLE Plan Cont asa and plavix   Best practice:  Diet: NPO Pain/Anxiety/Delirium protocol (if indicated): Propofol per protocol VAP protocol (if indicated): Yes DVT prophylaxis: Lovenox GI prophylaxis: PPI Glucose control: SSI Mobility: BR Code Status: FULL Family Communication: Daughter updated Disposition: ICU  Critical care time  33 min  Erick Colace ACNP-BC [place] Pager # [phone] OR # [phone] if no answer

## 2020-01-28 NOTE — Progress Notes (Signed)
Daughter was updated on patient's status and plan of care. All questions were answered at this time. RN assisted with setting up e-link phone call with the daughter, Juluis Rainier.

## 2020-01-28 NOTE — Progress Notes (Signed)
Bilateral lower extremity venous duplex completed. Refer to "CV Proc" under chart review to view preliminary results.  01/28/2020 4:48 PM Kelby [name]., MHA, RVT, RDCS, RDMS

## 2020-01-28 NOTE — Progress Notes (Signed)
eLink Physician-Brief Progress Note Patient Name: [name] DOB: 13-Jun-1956 MRN: [id number]   Date of Service  01/28/2020  HPI/Events of Note  Hyperglycemia - Blood glucose  = 405.   eICU Interventions  Will order: 1. D/ Novolog SSI orders.  2. Insulin IV infusion per EndoTool order set.      Intervention Category Major Interventions: Hyperglycemia - active titration of insulin therapy  Lysle Dingwall 01/28/2020, 11:50 PM

## 2020-01-28 NOTE — Progress Notes (Signed)
Report given to Caryl Pina, RN on 102M/ICU.

## 2020-01-28 NOTE — Progress Notes (Signed)
Pt transferred to 65m with no complications. Rt will continue to monitor and treat.

## 2020-01-28 NOTE — Progress Notes (Signed)
Peripherally Inserted Central Catheter Placement  The IV Nurse has discussed with the patient and/or persons authorized to consent for the patient, the purpose of this procedure and the potential benefits and risks involved with this procedure.  The benefits include less needle sticks, lab draws from the catheter, and the patient may be discharged home with the catheter. Risks include, but not limited to, infection, bleeding, blood clot (thrombus formation), and puncture of an artery; nerve damage and irregular heartbeat and possibility to perform a PICC exchange if needed/ordered by physician.  Alternatives to this procedure were also discussed.  Bard Power PICC patient education guide, fact sheet on infection prevention and patient information card has been provided to patient /or left at bedside.    PICC Placement Documentation  PICC Double Lumen [id number] PICC Right Basilic 38 cm 0 cm (Active)  Indication for Insertion or Continuance of Line Vasoactive infusions 01/28/20 1122  Exposed Catheter (cm) 0 cm 01/28/20 1122  Site Assessment Clean;Dry;Intact 01/28/20 1122  Lumen #1 Status Flushed;Saline locked;Blood return noted 01/28/20 1122  Lumen #2 Status Flushed;Saline locked;Blood return noted 01/28/20 1122  Dressing Type Transparent;Securing device 01/28/20 1122  Dressing Status Clean;Dry;Intact;Antimicrobial disc in place 01/28/20 1122  Safety Lock Not Applicable [id number]  Dressing Intervention New dressing 01/28/20 1122  Dressing Change Due 02/04/20 01/28/20 [place], Marlisa Caridi 01/28/2020, 11:23 AM

## 2020-01-28 NOTE — Progress Notes (Signed)
Inpatient Diabetes Program Recommendations  AACE/ADA: New Consensus Statement on Inpatient Glycemic Control (2015)  Target Ranges:  Prepandial:   less than 140 mg/dL      Peak postprandial:   less than 180 mg/dL (1-2 hours)      Critically ill patients:  140 - 180 mg/dL   Lab Results  Component Value Date   GLUCAP 226 (H) 01/28/2020   HGBA1C 12.2 (H) 01/05/2020    Review of Glycemic Control Results for [name], [name] (MRN [id number]) as of 01/28/2020 12:28  Ref. Range 01/28/2020 00:45 01/28/2020 03:39 01/28/2020 07:15 01/28/2020 11:55  Glucose-Capillary Latest Ref Range: 70 - 99 mg/dL 218 (H) 220 (H) 241 (H) 226 (H)   Diabetes history: DM 2 Outpatient Diabetes medications: Lantus 50 units qam, Novolog 3-20 units tid with meals for glucose > 150 mg/dl Current orders for Inpatient glycemic control: Novolog 3 units Q4H, Novolog 0-15 units Q4H  Inpatient Diabetes Program Recommendations:    Noted previous issue with AM hypoglycemia. Now that patient has restarted Solumedrol and initiated tube feeds, anticipate insulin needs to increase.  Consider starting Levemir 8 units BID.    Thanks, Bronson Curb, MSN, RNC-OB Diabetes Coordinator [phone] (8a-5p)

## 2020-01-28 NOTE — Consult Note (Addendum)
NAME:  [name], MRN:  [id number], DOB:  Nov 12, 1955, LOS: 88 ADMISSION DATE:  12/25/2019, CONSULTATION DATE:  5/11 REFERRING MD:  Dr Candiss Norse, CHIEF COMPLAINT:  Hypoxia   Brief History   64 year old female admitted 4/26 for COVID pneumonia and CHF. Initially improved with the usual treatment. Was on room air. 5/8 developed worsening hypoxia that was progressive despite diuresis. As of 5/11 required NRB and HFNC at 35LPM and became lethargic. PCCM consulted.   History of present illness   64 year old female with PMH as below, which is significant for DM, CKD, CHF, COPD, and HTN. Recently admitted to [facility] 4/18 for acute limb ischemia and underwent stent placement to the left lower extremity. She was discharged home, but soon after developed shortness of breath and productive cough causing her to again present to the ED. Exam in the ED was consistent with both volume overload and infection. She tested positive for COVID 19. She was admitted to the hospitalist service and treated with Remdesivir, decadron, actemra, and diuresis. She improved to the point she was only on room air as of 5/5. Then in the PM hours of 5/8 she developed what was felt to be flash pulmonary edema. She was treated with diuresis, which responded with good urinary output and negative volume status, but only worsening of her pulmonary status. In the time since that event, she her oxygen requirement has escalated to 15L NRB in addition to HFNC 35 LPM. Workup for PE not convincing. Course has also been complicated by AKI and labile blood sugars. PCCM consulted for hypoxia on 5/11.  Past Medical History   has a past medical history of Achilles rupture, Anxiety, Arthritis, Asthma, Chronic back pain, Chronic diastolic heart failure ([place]) (11/30/2016), Chronic headaches, Chronic leg pain, CHRONIC OBSTRUCTIVE PULMONARY DISEASE, MODERATE (03/26/2010), Depression, Diabetes mellitus (1999), Glaucoma, Gout, H/O gestational diabetes mellitus,  not currently pregnant, Hyperlipidemia, Hypertension, Morbid obesity ([place]), Obstructive sleep apnea on CPAP (07/02/2009), OSA (obstructive sleep apnea), and Sciatica.   Significant Hospital Events     Consults:    Procedures:  5/12 ETT  Significant Diagnostic Tests:  Lower extremity doppler 4/27 > no evidence of DVT Echo 4/29 > Grade II DD, moderate reduction of RV systolic function.  V/Q 4/30 > No evidence PE US renal 4/30 > normal size kidneys, no hydro.   Micro Data:  COVID pos 4/27 > Flu neg 4/27 > Blood cx neg 4/28 >  Antimicrobials:  Cefepime 5/12 > Vancomycin  5/12 >  Interim history/subjective:    Objective   Blood pressure (!) 128/91, pulse (!) 58, temperature 98.4 F (36.9 C), temperature source Axillary, resp. rate (!) 29, height 5\' 1"  (1.549 m), weight 103.5 kg, last menstrual period 02/16/2017, SpO2 94 %.    FiO2 (%):  [60 %] 60 %   Intake/Output Summary (Last 24 hours) at 01/28/2020 0107 Last data filed at 01/28/2020 0000 Gross per 24 hour  Intake 360 ml  Output 700 ml  Net -340 ml   Filed Weights   01/25/20 0500 01/26/20 0448 01/27/20 0551  Weight: 106.4 kg 109.7 kg 103.5 kg    Examination: General: Obese female in moderate distress HENT: [place]/AT, PERRL, no JVD Lungs: Coarse wheeze Cardiovascular: Tachy, regular, no MRG Abdomen: Soft, non-tender, non-distended Extremities: No acute deformity or ROM limitation Neuro: Somnolent, but easily arouses and answers questions. Then drifts off.   Resolved Hospital Problem list     Assessment & Plan:   Acute hypoxemic respiratory failure:  COVID 19 pneumonia: positive test 4/27. S/p Remdesivir, decadron, actemra Acute on chronic HFpEF COPD +/- acute exacerbation - Transfer to ICU for intubation - Full vent support - Follow CXR/ABG - VAP bundle - Cover for bacterial co-infection - Conitnue diuresis - Scheduled nebs and restart steroids.  - Propofol for RASS goal -1 to -2  DM - hold lantus for  now as she has struggled with AM hypoglycemia - CBG/SSI to q 4 hours  AKI CKD - had returned to baseline - Continue to monitor BMP  OSA: non-complaint with CPAP, refused inpatient as well.  - Will need ongoing pulmonary follow up  Elevated D Dimer: likely due to COVID. VTE essentially ruled out dopplers and VQ scan.  - High dose lovenox.   PAD: s/p recent stent to LLE - Continue aspirin and Plavix as able  Best practice:  Diet: NPO Pain/Anxiety/Delirium protocol (if indicated): Propofol per protocol VAP protocol (if indicated): Yes DVT prophylaxis: Lovenox GI prophylaxis: PPI Glucose control: SSI Mobility: BR Code Status: FULL Family Communication: Daughter updated Disposition: ICU  Labs   CBC: Recent Labs  Lab 01/23/20 0721 01/24/20 0812 01/25/20 0800 01/26/20 0353 01/27/20 0427  WBC 39.5* 38.5* 31.6* 27.1* 15.3*  NEUTROABS  --  32.6*  --  22.6* 12.6*  HGB 12.7 10.7* 10.6* 10.6* 10.4*  HCT 38.9 33.3* 32.9* 33.0* 33.6*  MCV 95.1 95.7 96.2 97.3 99.1  PLT 297 244 202 186 148*    Basic Metabolic Panel: Recent Labs  Lab 01/23/20 1012 01/24/20 0812 01/25/20 0800 01/26/20 0353 01/27/20 0427  NA 139 140 141 142 143  K 5.0 4.2 3.7 4.0 4.0  CL 104 102 105 105 101  CO2 19* 24 25 27 30   GLUCOSE 136* 71 69* 63* 108*  BUN 72* 70* 57* 52* 40*  CREATININE 1.84* 1.98* 1.74* 1.73* 1.54*  CALCIUM 8.1* 8.4* 8.4* 8.3* 8.3*  MG  --   --  2.4 2.3  --    GFR: Estimated Creatinine Clearance: 41.4 mL/min (A) (by C-G formula based on SCr of 1.54 mg/dL (H)). Recent Labs  Lab 01/24/20 0812 01/25/20 0800 01/26/20 0353 01/27/20 0427  PROCALCITON 0.21  --   --   --   WBC 38.5* 31.6* 27.1* 15.3*    Liver Function Tests: Recent Labs  Lab 01/22/20 1042 01/23/20 1012 01/25/20 0800 01/26/20 0353 01/27/20 0427  AST 51* 69* 64* 58* 57*  ALT 38 36 53* 52* 49*  ALKPHOS 74 75 71 79 80  BILITOT 1.4* 1.2 1.1 1.1 1.4*  PROT 5.9* 5.5* 5.2* 5.1* 5.1*  ALBUMIN 2.7* 2.6* 2.7*  2.5* 2.6*   No results for input(s): LIPASE, AMYLASE in the last 168 hours. No results for input(s): AMMONIA in the last 168 hours.  ABG    Component Value Date/Time   PHART 7.490 (H) 01/27/2020 2320   PCO2ART 42.7 01/27/2020 2320   PO2ART 56.0 (L) 01/27/2020 2320   HCO3 32.3 (H) 01/27/2020 2320   TCO2 28 12/06/2016 0922   TCO2 28 12/06/2016 0922   O2SAT 88.6 01/27/2020 2320     Coagulation Profile: No results for input(s): INR, PROTIME in the last 168 hours.  Cardiac Enzymes: No results for input(s): CKTOTAL, CKMB, CKMBINDEX, TROPONINI in the last 168 hours.  HbA1C: Hgb A1c MFr Bld  Date/Time Value Ref Range Status  01/05/2020 11:02 AM 12.2 (H) 4.8 - 5.6 % Final    Comment:    (NOTE) Pre diabetes:          5.7%-6.4%  Diabetes:              >6.4% Glycemic control for   <7.0% adults with diabetes   12/02/2016 04:36 AM 9.9 (H) 4.8 - 5.6 % Final    Comment:    (NOTE)         Pre-diabetes: 5.7 - 6.4         Diabetes: >6.4         Glycemic control for adults with diabetes: <7.0     CBG: Recent Labs  Lab 01/27/20 0752 01/27/20 1130 01/27/20 1652 01/27/20 2111 01/28/20 0045  GLUCAP 100* 234* 241* 243* 218*    Review of Systems:   Bolds are positive  Constitutional: weight loss, gain, night sweats, Fevers, chills, fatigue .  HEENT: headaches, Sore throat, sneezing, nasal congestion, post nasal drip, Difficulty swallowing, Tooth/dental problems, visual complaints visual changes, ear ache CV:  chest pain, radiates:,Orthopnea, PND, swelling in lower extremities, dizziness, palpitations, syncope.  GI  heartburn, indigestion, abdominal pain, nausea, vomiting, diarrhea, change in bowel habits, loss of appetite, bloody stools.  Resp: cough, productive:, hemoptysis, dyspnea, chest pain, pleuritic.  Skin: rash or itching or icterus GU: dysuria, change in color of urine, urgency or frequency. flank pain, hematuria  MS: joint pain or swelling. decreased range of motion    Psych: change in mood or affect. depression or anxiety.  Neuro: difficulty with speech, weakness, numbness, ataxia    Past Medical History  She,  has a past medical history of Achilles rupture, Anxiety, Arthritis, Asthma, Chronic back pain, Chronic diastolic heart failure ([place]) (11/30/2016), Chronic headaches, Chronic leg pain, CHRONIC OBSTRUCTIVE PULMONARY DISEASE, MODERATE (03/26/2010), Depression, Diabetes mellitus (1999), Glaucoma, Gout, H/O gestational diabetes mellitus, not currently pregnant, Hyperlipidemia, Hypertension, Morbid obesity ([place]), Obstructive sleep apnea on CPAP (07/02/2009), OSA (obstructive sleep apnea), and Sciatica.   Surgical History    Past Surgical History:  Procedure Laterality Date   ABDOMINAL AORTOGRAM W/LOWER EXTREMITY Bilateral 01/08/2020   Procedure: ABDOMINAL AORTOGRAM W/LOWER EXTREMITY;  Surgeon: Marty Heck, MD;  Location: [facility];  Service: Cardiovascular;  Laterality: Bilateral;   BACK SURGERY     BREAST EXCISIONAL BIOPSY Right    CERVICAL SPINE SURGERY     CESAREAN SECTION  12/10/89   CHOLECYSTECTOMY  1991   LUMBAR SPINE SURGERY     PERIPHERAL VASCULAR INTERVENTION Left 01/08/2020   Procedure: PERIPHERAL VASCULAR INTERVENTION;  Surgeon: Marty Heck, MD;  Location: [facility];  Service: Cardiovascular;  Laterality: Left;   RIGHT HEART CATH N/A 12/06/2016   Procedure: Right Heart Cath;  Surgeon: Larey Dresser, MD;  Location: [facility];  Service: Cardiovascular;  Laterality: N/A;   TUBAL LIGATION       Social History   reports that she quit smoking about 21 years ago. Her smoking use included cigarettes. She has never used smokeless tobacco. She reports current alcohol use. She reports that she does not use drugs.   Family History   Her family history includes Heart failure in her mother; Renal Disease in her mother.   Allergies Allergies  Allergen Reactions   Pork Allergy    Pork-Derived  Products Nausea And Vomiting     Home Medications  Prior to Admission medications   Medication Sig Start Date End Date Taking? Authorizing Provider  acetaminophen (TYLENOL) 500 MG tablet Take 500-1,000 mg by mouth every 6 (six) hours as needed for mild pain or headache.    Yes [provider]  ALPHAGAN P 0.15 %  ophthalmic solution Place 1 drop into both eyes 2 (two) times daily.  07/03/19  Yes [provider]  aspirin EC 81 MG tablet Take 81 mg by mouth daily with breakfast.   Yes [provider]  atorvastatin (LIPITOR) 40 MG tablet Take 1 tablet (40 mg total) by mouth daily. 01/09/20 02/08/20 Yes Donne Hazel, MD  Cholecalciferol (VITAMIN D) 50 MCG (2000 UT) tablet Take 2,000 Units by mouth daily.   Yes [provider]  clopidogrel (PLAVIX) 75 MG tablet Take 1 tablet (75 mg total) by mouth daily. 01/10/20 02/09/20 Yes Donne Hazel, MD  colchicine 0.6 MG tablet Take 0.6 mg by mouth daily.   Yes [provider]  dorzolamide-timolol (COSOPT) 22.3-6.8 MG/ML ophthalmic solution Place 1 drop into both eyes 2 (two) times daily.   Yes [provider]  doxazosin (CARDURA) 4 MG tablet Take 4 mg by mouth at bedtime.   Yes [provider]  insulin aspart (NOVOLOG FLEXPEN) 100 UNIT/ML FlexPen Inject 3-20 Units into the skin 3 (three) times daily as needed for high blood sugar (CBG 150).   Yes [provider]  insulin glargine (LANTUS) 100 unit/mL SOPN Inject 40 Units into the skin daily before breakfast.    Yes [provider]  liraglutide (VICTOZA) 18 MG/3ML SOPN Inject 1.8 mg into the skin daily.    Yes [provider]  lisinopril (PRINIVIL,ZESTRIL) 5 MG tablet Take 5 mg by mouth daily with breakfast.  06/18/17  Yes [provider]  oxyCODONE-acetaminophen (PERCOCET/ROXICET) 5-325 MG tablet Take 1-2 tablets by mouth every 6 (six) hours as needed for moderate pain. 01/09/20  Yes Donne Hazel, MD    prednisoLONE acetate (PRED FORTE) 1 % ophthalmic suspension Place 1 drop into the right eye in the morning and at bedtime. 01/01/20  Yes [provider]  [place] into the lungs at bedtime. CPAP   Yes [provider]  torsemide (DEMADEX) 20 MG tablet Take 3 tablets (60 mg total) by mouth as directed. Take 60 mg in the AM. You may take 60 mg in the PM as needed for swelling Patient taking differently: Take 40 mg by mouth daily.  12/04/17  Yes Josue Hector, MD  Travoprost, BAK Free, (TRAVATAN) 0.004 % SOLN ophthalmic solution Place 1 drop into both eyes at bedtime.   Yes [provider]  valACYclovir (VALTREX) 1000 MG tablet Take 1,000 mg by mouth 3 (three) times daily. 09/29/19  Yes [provider]  albuterol (PROVENTIL HFA;VENTOLIN HFA) 108 (90 BASE) MCG/ACT inhaler Inhale 2 puffs into the lungs every 6 (six) hours as needed for wheezing or shortness of breath.     Robyn Haber, MD  Vitamin D, Ergocalciferol, (DRISDOL) 50000 units CAPS capsule Take 1 capsule (50,000 Units total) by mouth every 7 (seven) days. Patient not taking: Reported on 01/05/2020 09/13/17   Adora Fridge, RPH-CPP     Critical care time: 50 minutes     Georgann Housekeeper, AGACNP-BC [place] for personal pager PCCM on call pager [phone]  01/28/2020 1:38 AM   Patient Seen examined, chart reviewed and critical care plan discussed with Mr [place] Bethel, agree with above assessment and plan

## 2020-01-29 ENCOUNTER — Inpatient Hospital Stay (HOSPITAL_COMMUNITY): Payer: BC Managed Care – PPO

## 2020-01-29 DIAGNOSIS — J9600 Acute respiratory failure, unspecified whether with hypoxia or hypercapnia: Secondary | ICD-10-CM

## 2020-01-29 DIAGNOSIS — J939 Pneumothorax, unspecified: Secondary | ICD-10-CM

## 2020-01-29 LAB — GLUCOSE, CAPILLARY
Glucose-Capillary: 151 mg/dL — ABNORMAL HIGH (ref 70–99)
Glucose-Capillary: 157 mg/dL — ABNORMAL HIGH (ref 70–99)
Glucose-Capillary: 166 mg/dL — ABNORMAL HIGH (ref 70–99)
Glucose-Capillary: 167 mg/dL — ABNORMAL HIGH (ref 70–99)
Glucose-Capillary: 172 mg/dL — ABNORMAL HIGH (ref 70–99)
Glucose-Capillary: 176 mg/dL — ABNORMAL HIGH (ref 70–99)
Glucose-Capillary: 178 mg/dL — ABNORMAL HIGH (ref 70–99)
Glucose-Capillary: 180 mg/dL — ABNORMAL HIGH (ref 70–99)
Glucose-Capillary: 192 mg/dL — ABNORMAL HIGH (ref 70–99)
Glucose-Capillary: 218 mg/dL — ABNORMAL HIGH (ref 70–99)
Glucose-Capillary: 272 mg/dL — ABNORMAL HIGH (ref 70–99)
Glucose-Capillary: 335 mg/dL — ABNORMAL HIGH (ref 70–99)
Glucose-Capillary: 413 mg/dL — ABNORMAL HIGH (ref 70–99)

## 2020-01-29 LAB — COMPREHENSIVE METABOLIC PANEL WITH GFR
ALT: 62 U/L — ABNORMAL HIGH (ref 0–44)
AST: 50 U/L — ABNORMAL HIGH (ref 15–41)
Albumin: 2.7 g/dL — ABNORMAL LOW (ref 3.5–5.0)
Alkaline Phosphatase: 84 U/L (ref 38–126)
Anion gap: 18 — ABNORMAL HIGH (ref 5–15)
BUN: 48 mg/dL — ABNORMAL HIGH (ref 8–23)
CO2: 20 mmol/L — ABNORMAL LOW (ref 22–32)
Calcium: 8.1 mg/dL — ABNORMAL LOW (ref 8.9–10.3)
Chloride: 98 mmol/L (ref 98–111)
Creatinine, Ser: 2.44 mg/dL — ABNORMAL HIGH (ref 0.44–1.00)
GFR calc Af Amer: 24 mL/min — ABNORMAL LOW
GFR calc non Af Amer: 20 mL/min — ABNORMAL LOW
Glucose, Bld: 162 mg/dL — ABNORMAL HIGH (ref 70–99)
Potassium: 4.8 mmol/L (ref 3.5–5.1)
Sodium: 136 mmol/L (ref 135–145)
Total Bilirubin: 1.4 mg/dL — ABNORMAL HIGH (ref 0.3–1.2)
Total Protein: 5.1 g/dL — ABNORMAL LOW (ref 6.5–8.1)

## 2020-01-29 LAB — POCT I-STAT 7, (LYTES, BLD GAS, ICA,H+H)
Acid-base deficit: 1 mmol/L (ref 0.0–2.0)
Acid-base deficit: 3 mmol/L — ABNORMAL HIGH (ref 0.0–2.0)
Bicarbonate: 27.9 mmol/L (ref 20.0–28.0)
Bicarbonate: 24.9 mmol/L (ref 20.0–28.0)
Calcium, Ion: 1.22 mmol/L (ref 1.15–1.40)
Calcium, Ion: 1.16 mmol/L (ref 1.15–1.40)
HCT: 36 % (ref 36.0–46.0)
HCT: 44 % (ref 36.0–46.0)
Hemoglobin: 12.2 g/dL (ref 12.0–15.0)
Hemoglobin: 15 g/dL (ref 12.0–15.0)
O2 Saturation: 96 %
O2 Saturation: 93 %
Patient temperature: 100.9
Potassium: 4.7 mmol/L (ref 3.5–5.1)
Potassium: 4.6 mmol/L (ref 3.5–5.1)
Sodium: 138 mmol/L (ref 135–145)
Sodium: 136 mmol/L (ref 135–145)
TCO2: 30 mmol/L (ref 22–32)
TCO2: 26 mmol/L (ref 22–32)
pCO2 arterial: 68.3 mmHg (ref 32.0–48.0)
pCO2 arterial: 52 mmHg — ABNORMAL HIGH (ref 32.0–48.0)
pH, Arterial: 7.227 — ABNORMAL LOW (ref 7.350–7.450)
pH, Arterial: 7.288 — ABNORMAL LOW (ref 7.350–7.450)
pO2, Arterial: 104 mmHg (ref 83.0–108.0)
pO2, Arterial: 75 mmHg — ABNORMAL LOW (ref 83.0–108.0)

## 2020-01-29 LAB — CBC WITH DIFFERENTIAL/PLATELET
Abs Immature Granulocytes: 0.42 10*3/uL — ABNORMAL HIGH (ref 0.00–0.07)
Basophils Absolute: 0.1 10*3/uL (ref 0.0–0.1)
Basophils Relative: 0 %
Eosinophils Absolute: 0.1 10*3/uL (ref 0.0–0.5)
Eosinophils Relative: 0 %
HCT: 35.1 % — ABNORMAL LOW (ref 36.0–46.0)
Hemoglobin: 11.4 g/dL — ABNORMAL LOW (ref 12.0–15.0)
Immature Granulocytes: 2 %
Lymphocytes Relative: 1 %
Lymphs Abs: 0.2 10*3/uL — ABNORMAL LOW (ref 0.7–4.0)
MCH: 34 pg (ref 26.0–34.0)
MCHC: 32.5 g/dL (ref 30.0–36.0)
MCV: 104.8 fL — ABNORMAL HIGH (ref 80.0–100.0)
Monocytes Absolute: 1.5 10*3/uL — ABNORMAL HIGH (ref 0.1–1.0)
Monocytes Relative: 5 %
Neutro Abs: 25 10*3/uL — ABNORMAL HIGH (ref 1.7–7.7)
Neutrophils Relative %: 92 %
Platelets: 106 10*3/uL — ABNORMAL LOW (ref 150–400)
RBC: 3.35 MIL/uL — ABNORMAL LOW (ref 3.87–5.11)
RDW: 22.1 % — ABNORMAL HIGH (ref 11.5–15.5)
WBC: 27.2 10*3/uL — ABNORMAL HIGH (ref 4.0–10.5)
nRBC: 0.8 % — ABNORMAL HIGH (ref 0.0–0.2)

## 2020-01-29 LAB — BRAIN NATRIURETIC PEPTIDE: B Natriuretic Peptide: 291.1 pg/mL — ABNORMAL HIGH (ref 0.0–100.0)

## 2020-01-29 LAB — TRIGLYCERIDES: Triglycerides: 1486 mg/dL — ABNORMAL HIGH

## 2020-01-29 LAB — PHOSPHORUS
Phosphorus: 6.2 mg/dL — ABNORMAL HIGH (ref 2.5–4.6)
Phosphorus: 7.8 mg/dL — ABNORMAL HIGH (ref 2.5–4.6)

## 2020-01-29 LAB — MAGNESIUM
Magnesium: 2.6 mg/dL — ABNORMAL HIGH (ref 1.7–2.4)
Magnesium: 2.6 mg/dL — ABNORMAL HIGH (ref 1.7–2.4)

## 2020-01-29 LAB — D-DIMER, QUANTITATIVE: D-Dimer, Quant: 3.24 ug/mL-FEU — ABNORMAL HIGH (ref 0.00–0.50)

## 2020-01-29 MED ORDER — IPRATROPIUM-ALBUTEROL 0.5-2.5 (3) MG/3ML IN SOLN: 3.0000 mL | RESPIRATORY_TRACT | Status: DC | PRN

## 2020-01-29 MED ORDER — COLCHICINE 0.6 MG PO TABS: 0.6000 mg | ORAL_TABLET | Freq: Every day | ORAL | Status: DC

## 2020-01-29 MED ORDER — DOCUSATE SODIUM 50 MG/5ML PO LIQD
100.0000 mg | Freq: Two times a day (BID) | ORAL | Status: DC
  Administered 2020-01-29 – 2020-01-30 (×2): 100 mg via ORAL
  Filled 2020-01-29: qty 10

## 2020-01-29 MED ORDER — LIDOCAINE HCL (CARDIAC) PF 100 MG/5ML IV SOSY
PREFILLED_SYRINGE | INTRAVENOUS | Status: AC
  Filled 2020-01-29: qty 5

## 2020-01-29 MED ORDER — INSULIN ASPART 100 UNIT/ML ~~LOC~~ SOLN
3.0000 [IU] | SUBCUTANEOUS | Status: DC
  Administered 2020-01-29: 9 [IU] via SUBCUTANEOUS
  Administered 2020-01-29: 6 [IU] via SUBCUTANEOUS

## 2020-01-29 MED ORDER — INSULIN ASPART 100 UNIT/ML ~~LOC~~ SOLN
5.0000 [IU] | SUBCUTANEOUS | Status: DC
  Administered 2020-01-29 (×2): 5 [IU] via SUBCUTANEOUS

## 2020-01-29 MED ORDER — FUROSEMIDE 10 MG/ML IJ SOLN: 80.0000 mg | Freq: Every day | INTRAMUSCULAR | Status: DC

## 2020-01-29 MED ORDER — MIDAZOLAM BOLUS VIA INFUSION
1.0000 mg | INTRAVENOUS | Status: DC | PRN
  Administered 2020-01-29: 1 mg via INTRAVENOUS
  Administered 2020-01-30: 2 mg via INTRAVENOUS
  Filled 2020-01-29: qty 2

## 2020-01-29 MED ORDER — ASPIRIN 81 MG PO CHEW
81.0000 mg | CHEWABLE_TABLET | Freq: Every day | ORAL | Status: DC
  Administered 2020-01-30 – 2020-02-05 (×7): 81 mg
  Filled 2020-01-29 (×8): qty 1

## 2020-01-29 MED ORDER — SODIUM CHLORIDE 0.9% FLUSH
10.0000 mL | Freq: Three times a day (TID) | INTRAVENOUS | Status: DC
  Administered 2020-01-29 – 2020-02-03 (×12): 10 mL
  Administered 2020-02-03: 20 mL
  Administered 2020-02-03 – 2020-02-06 (×8): 10 mL

## 2020-01-29 MED ORDER — INSULIN DETEMIR 100 UNIT/ML ~~LOC~~ SOLN
18.0000 [IU] | Freq: Two times a day (BID) | SUBCUTANEOUS | Status: DC
  Administered 2020-01-29 (×2): 18 [IU] via SUBCUTANEOUS
  Filled 2020-01-29 (×3): qty 0.18

## 2020-01-29 MED ORDER — ENOXAPARIN SODIUM 60 MG/0.6ML ~~LOC~~ SOLN: 0.5000 mg/kg | Freq: Every day | SUBCUTANEOUS | Status: DC

## 2020-01-29 MED ORDER — FENTANYL BOLUS VIA INFUSION
50.0000 ug | INTRAVENOUS | Status: DC | PRN
  Administered 2020-01-29 – 2020-02-02 (×6): 50 ug via INTRAVENOUS
  Filled 2020-01-29: qty 50

## 2020-01-29 MED ORDER — POLYETHYLENE GLYCOL 3350 17 G PO PACK: 17.0000 g | PACK | Freq: Every day | ORAL | Status: DC

## 2020-01-29 MED ORDER — FENTANYL CITRATE (PF) 100 MCG/2ML IJ SOLN
50.0000 ug | Freq: Once | INTRAMUSCULAR | Status: AC
  Administered 2020-01-29: 50 ug via INTRAVENOUS

## 2020-01-29 MED ORDER — CLOPIDOGREL BISULFATE 75 MG PO TABS
75.0000 mg | ORAL_TABLET | Freq: Every day | ORAL | Status: DC
  Administered 2020-01-30 – 2020-02-05 (×7): 75 mg
  Filled 2020-01-29 (×8): qty 1

## 2020-01-29 MED ORDER — DEXTROSE 10 % IV SOLN: INTRAVENOUS | Status: DC | PRN

## 2020-01-29 MED ORDER — "THROMBI-PAD 3""X3"" EX PADS"
1.0000 | MEDICATED_PAD | Freq: Once | CUTANEOUS | Status: AC
  Administered 2020-01-29: 1 via TOPICAL
  Filled 2020-01-29: qty 1

## 2020-01-29 MED ORDER — ENOXAPARIN SODIUM 100 MG/ML ~~LOC~~ SOLN: 100.0000 mg | SUBCUTANEOUS | Status: DC

## 2020-01-29 MED ORDER — FENTANYL 2500MCG IN NS 250ML (10MCG/ML) PREMIX INFUSION
0.0000 ug/h | INTRAVENOUS | Status: DC
  Administered 2020-01-29: 100 ug/h via INTRAVENOUS
  Administered 2020-01-29: 200 ug/h via INTRAVENOUS
  Administered 2020-01-30: 250 ug/h via INTRAVENOUS
  Administered 2020-01-31 – 2020-02-02 (×6): 300 ug/h via INTRAVENOUS
  Filled 2020-01-29 (×10): qty 250

## 2020-01-29 MED ORDER — MIDAZOLAM HCL 2 MG/2ML IJ SOLN
2.0000 mg | Freq: Once | INTRAMUSCULAR | Status: AC
  Administered 2020-01-29: 2 mg via INTRAVENOUS

## 2020-01-29 MED ORDER — DOCUSATE SODIUM 50 MG/5ML PO LIQD
100.0000 mg | Freq: Two times a day (BID) | ORAL | Status: DC
  Administered 2020-01-31 – 2020-02-01 (×3): 100 mg
  Filled 2020-01-29 (×4): qty 10

## 2020-01-29 MED ORDER — MIDAZOLAM 50MG/50ML (1MG/ML) PREMIX INFUSION
0.0000 mg/h | INTRAVENOUS | Status: DC
  Administered 2020-01-29 – 2020-01-30 (×2): 2 mg/h via INTRAVENOUS
  Administered 2020-01-30: 5 mg/h via INTRAVENOUS
  Administered 2020-01-31 – 2020-02-02 (×8): 7 mg/h via INTRAVENOUS
  Filled 2020-01-29 (×11): qty 50

## 2020-01-29 MED ORDER — ZINC SULFATE 220 (50 ZN) MG PO CAPS
220.0000 mg | ORAL_CAPSULE | Freq: Every day | ORAL | Status: DC
  Administered 2020-01-30 – 2020-02-05 (×7): 220 mg
  Filled 2020-01-29 (×8): qty 1

## 2020-01-29 MED ORDER — MIDAZOLAM HCL 2 MG/2ML IJ SOLN
INTRAMUSCULAR | Status: AC
  Filled 2020-01-29: qty 2

## 2020-01-29 NOTE — Progress Notes (Signed)
Assisted tele visit to patient with daughter.  Margaret Pyle, RN

## 2020-01-29 NOTE — Progress Notes (Signed)
[facility] Progress Note Patient Name: [name] DOB: 06-19-1956 MRN: [id number]   Date of Service  01/29/2020  HPI/Events of Note  ABG on 100%/PRVC 26/TV 320/P 10 = 7.227/68.3/104.  eICU Interventions  Will order: 1. Increase PRVC rate to 32. 2. Repeat ABG at 7:30 AM.      Intervention Category Major Interventions: Respiratory failure - evaluation and management;Acid-Base disturbance - evaluation and management  [name] 01/29/2020, 6:24 AM

## 2020-01-29 NOTE — Procedures (Signed)
Chest Tube Insertion Procedure Note  Indications:  Clinically significant Pneumothorax  Pre-operative Diagnosis: Pneumothorax  Post-operative Diagnosis: Pneumothorax  Procedure Details  Informed consent was obtained for the procedure, including sedation.  Risks of lung perforation, hemorrhage, arrhythmia, and adverse drug reaction were discussed.   After sterile skin prep, using standard technique, a 14 French tube was placed in the right lateral 6th rib space.  CT sutured.  Connected to pleur-vac on 20 cm sxn.    Findings: None  Estimated Blood Loss:  Minimal         Specimens:  None              Complications:  None; patient tolerated the procedure well.         Disposition: ICU - intubated and critically ill.         Condition: stable  Assisted by Gaylyn Lambert, ACNP. Procedure performed under direct supervision of Dr. Lamonte Sakai.   Christina Rad, MSN, AGACNP-BC [facility] 01/29/2020, 1:17 PM See Amion for personal pager PCCM on call pager [phone]

## 2020-01-29 NOTE — Procedures (Signed)
Chest Tube Insertion Procedure Note  Indications:  Clinically significant Pneumothorax  Pre-operative Diagnosis: Pneumothorax  Post-operative Diagnosis: Pneumothorax  Procedure Details  Informed consent was obtained for the procedure, including sedation.  Risks of lung perforation, hemorrhage, arrhythmia, and adverse drug reaction were discussed.   After sterile skin prep, using standard technique, a 14 French tube was placed in the left lateral 7th rib space using Seldinger technique  Findings: None  Estimated Blood Loss:  Minimal         Specimens:  None              Complications:  None; patient tolerated the procedure well.         Disposition: ICU - intubated and hemodynamically stable.         Condition: stable  Attending Attestation: I performed the procedure.  Baltazar Apo, MD, PhD 01/29/2020, 12:56 PM [facility] [phone] or if no answer [phone]

## 2020-01-29 NOTE — Progress Notes (Signed)
PT Cancellation Note  Patient Details Name: [name] MRN: [id number] DOB: June 16, 1956   Cancelled Treatment:    Reason Eval/Treat Not Completed: Patient not medically ready.  Pt unable to participate with therapy today, ?newly intubated. 01/29/2020  Ginger Carne., PT Acute Rehabilitation Services [phone]  (pager) [phone]  (office)   [name] 01/29/2020, 3:07 PM

## 2020-01-29 NOTE — Progress Notes (Signed)
NAME:  [name], MRN:  [id number], DOB:  27-Mar-1956, LOS: 59 ADMISSION DATE:  01/16/2020, CONSULTATION DATE:  5/11 REFERRING MD:  Dr Candiss Norse, CHIEF COMPLAINT:  Hypoxia   Brief History   64 year old female admitted 4/26 for COVID pneumonia and CHF. Initially improved with the usual treatment. Was on room air. 5/8 developed worsening hypoxia that was progressive despite diuresis. As of 5/11 required NRB and HFNC at 35LPM and became lethargic. PCCM consulted.     Past Medical History   has a past medical history of Achilles rupture, Anxiety, Arthritis, Asthma, Chronic back pain, Chronic diastolic heart failure ([place]) (11/30/2016), Chronic headaches, Chronic leg pain, CHRONIC OBSTRUCTIVE PULMONARY DISEASE, MODERATE (03/26/2010), Depression, Diabetes mellitus (1999), Glaucoma, Gout, H/O gestational diabetes mellitus, not currently pregnant, Hyperlipidemia, Hypertension, Morbid obesity ([place]), Obstructive sleep apnea on CPAP (07/02/2009), OSA (obstructive sleep apnea), and Sciatica.   Significant Hospital Events   4/18-4/23>> admit to [facility]-s/p stent to left distal SFA 4/27>> admit to [facility] for hypoxia from Covid/heart failure 4/27>> lower extremity Dopplers negative for DVT. Q000111Q grade 2 diastolic dysfunction, moderate RV systolic dysfunction.  RVSP 62.6 mmHg. 4/30>> VQ scan-low probability PE 5/5>> improved-on room air 5/8>> worsening hypoxemia-on 15 L of oxygen-with dramatic response to Lasix-down to 4 L 5/10 -5/12 once again requiring oxygen. Had been down to room air prior to this. Oxygen requirements progressed to point where she required intubation. 5/10 weight was up significantly. 106.4 to 109.7 Kg. 5/12 intubated. ABX started. Lasix escalated. LE [place] repeated and was placed on therapeutic LMWH. Post intubation film w/ small pneumomediastinum. Tube feeds started. Pulse steroids started Consults:    Procedures:  5/12 ETT Right brachial..  Percutaneous indwelling central line  01/28/2019>>  Significant Diagnostic Tests:  Lower extremity doppler 4/27 > no evidence of DVT Echo 4/29 > Grade II DD, moderate reduction of RV systolic function.  V/Q 4/30 > No evidence PE US renal 4/30 > normal size kidneys, no hydro.   Micro Data:  COVID pos 4/27 > Flu neg 4/27 > Blood cx neg 4/28 >  Antimicrobials:  Cefepime 5/12 > Vancomycin  5/12 >zyvox (changed due to cr)>>> 01/28/2020 Zyvox 01/28/2020 vancomycin discontinued.>>  Interim history/subjective:  Heavily sedated  Objective   Blood pressure (!) 108/52, pulse 76, temperature 98.2 F (36.8 C), temperature source Oral, resp. rate (!) 30, height 5\' 3"  (1.6 m), weight 99.5 kg, last menstrual period 02/16/2017, SpO2 98 %.    Vent Mode: PRVC FiO2 (%):  [70 %-100 %] 100 % Set Rate:  [26 bmp-32 bmp] 32 bmp Vt Set:  [320 mL] 320 mL PEEP:  [10 cmH20] 10 cmH20   Intake/Output Summary (Last 24 hours) at 01/29/2020 1120 Last data filed at 01/29/2020 0600 Gross per 24 hour  Intake 2776.03 ml  Output 800 ml  Net 1976.03 ml   Filed Weights   01/26/20 0448 01/27/20 0551 01/28/20 0500  Weight: 109.7 kg 103.5 kg 99.5 kg    Examination:  General: Morbidly obese female heavily sedated HEENT: Endotracheal tube in place Neuro: Currently heavily sedated CV: Sinus rhythm PULM: Diminished throughout Vent pressure regulated volume control FIO2 80% PEEP 12 RATE 32 VT 320  GI: soft, bsx4 active  GU: Amber urine Extremities: warm/dry, negative edema  Skin: no rashes or lesions  Resolved Hospital Problem list     Assessment & Plan:   Acute hypoxemic respiratory failure:  COVID 19 pneumonia: positive test 4/27. S/p Remdesivir, decadron, actemra Acute on chronic HFpEF  COPD +/- acute exacerbation  Bilateral small pneumothoraces chest x-ray 08/30/2020 Pneumomediastinum  Plan Continue ARDS protocol Wean per protocol Decrease Lasix to daily Currently on cefepime and Zyvox Sedate as necessary Continue Solu-Medrol  60 daily Increase molecular weight heparin to DVT prophylaxis Serial chest x-ray monitor pneumothoraces no apparent need for chest tubes at this time Will place bilateral chest tubes 01/29/2019  Bradycardia -2/2 propofol Plan Continue telemetry Levophed in place  CKD stage 3b Looks like baseline cr ~1.5 to 2.5 even dating back 2 yrs ago. Currently at baseline  Lab Results  Component Value Date   CREATININE 2.44 (H) 01/29/2020   CREATININE 1.63 (H) 01/28/2020   CREATININE 1.54 (H) 01/27/2020   CREATININE 1.19 (H) 01/22/2013   Recent Labs  Lab 01/28/20 0403 01/29/20 0500 01/29/20 0554  NA 140 136 138     Plan Continue diuresis as blood pressure and creatinine allow noted creatinine 1.63-2.44 Decrease Lasix to once a day for now  OSA: non-complaint with CPAP, refused inpatient as well.  Plan Become compliant with CPAP if and when she is extubating  DM w/ labile glycemic control (drops in am) Plan Continue sliding scale insulin protocol Currently on insulin drip   Elevated D Dimer: likely due to [place]. VTE essentially ruled out dopplers and VQ scan completed on 4/30; no sig bump on indices.   Plan Continue low molecular weight heparin 08/2020 lower extremity Doppler study was negative for DVT will return to DVT prophylaxis strength Lovenox  PAD: s/p recent stent to LLE Plan Continue aspirin and Plavix   Best practice:  Diet: NPO Pain/Anxiety/Delirium protocol (if indicated): Propofol per protocol VAP protocol (if indicated): Yes DVT prophylaxis: Lovenox GI prophylaxis: PPI Glucose control: SSI Mobility: BR Code Status: FULL Family Communication: Update daughter daily Disposition: ICU  Critical care time  71 min  Erick Colace ACNP-BC [place] Pager # [phone] OR # [phone] if no answer

## 2020-01-29 NOTE — Progress Notes (Signed)
CSW received call from patient's daughter requesting assistance with FMLA paperwork. CSW requested that she contact patient's PCP Office to complete it as they are familiar with the patient. CSW provided PCP's contact info and daughter reports she will call them.  Cantrell Martus LCSW

## 2020-01-30 ENCOUNTER — Inpatient Hospital Stay (HOSPITAL_COMMUNITY): Payer: BC Managed Care – PPO

## 2020-01-30 DIAGNOSIS — N179 Acute kidney failure, unspecified: Secondary | ICD-10-CM

## 2020-01-30 LAB — POCT I-STAT 7, (LYTES, BLD GAS, ICA,H+H)
Acid-Base Excess: 0 mmol/L (ref 0.0–2.0)
Acid-base deficit: 4 mmol/L — ABNORMAL HIGH (ref 0.0–2.0)
Acid-base deficit: 3 mmol/L — ABNORMAL HIGH (ref 0.0–2.0)
Bicarbonate: 26.5 mmol/L (ref 20.0–28.0)
Bicarbonate: 27.8 mmol/L (ref 20.0–28.0)
Bicarbonate: 28.4 mmol/L — ABNORMAL HIGH (ref 20.0–28.0)
Calcium, Ion: 1.15 mmol/L (ref 1.15–1.40)
Calcium, Ion: 1.13 mmol/L — ABNORMAL LOW (ref 1.15–1.40)
Calcium, Ion: 1.1 mmol/L — ABNORMAL LOW (ref 1.15–1.40)
HCT: 35 % — ABNORMAL LOW (ref 36.0–46.0)
HCT: 32 % — ABNORMAL LOW (ref 36.0–46.0)
HCT: 32 % — ABNORMAL LOW (ref 36.0–46.0)
Hemoglobin: 11.9 g/dL — ABNORMAL LOW (ref 12.0–15.0)
Hemoglobin: 10.9 g/dL — ABNORMAL LOW (ref 12.0–15.0)
Hemoglobin: 10.9 g/dL — ABNORMAL LOW (ref 12.0–15.0)
O2 Saturation: 93 %
O2 Saturation: 94 %
O2 Saturation: 95 %
Patient temperature: 96.5
Patient temperature: 97.6
Potassium: 5.9 mmol/L — ABNORMAL HIGH (ref 3.5–5.1)
Potassium: 4.8 mmol/L (ref 3.5–5.1)
Potassium: 5.1 mmol/L (ref 3.5–5.1)
Sodium: 132 mmol/L — ABNORMAL LOW (ref 135–145)
Sodium: 138 mmol/L (ref 135–145)
Sodium: 137 mmol/L (ref 135–145)
TCO2: 29 mmol/L (ref 22–32)
TCO2: 30 mmol/L (ref 22–32)
TCO2: 30 mmol/L (ref 22–32)
pCO2 arterial: 71.1 mmHg (ref 32.0–48.0)
pCO2 arterial: 83.1 mmHg (ref 32.0–48.0)
pCO2 arterial: 65.1 mmHg (ref 32.0–48.0)
pH, Arterial: 7.172 — CL (ref 7.350–7.450)
pH, Arterial: 7.133 — CL (ref 7.350–7.450)
pH, Arterial: 7.244 — ABNORMAL LOW (ref 7.350–7.450)
pO2, Arterial: 82 mmHg — ABNORMAL LOW (ref 83.0–108.0)
pO2, Arterial: 95 mmHg (ref 83.0–108.0)
pO2, Arterial: 91 mmHg (ref 83.0–108.0)

## 2020-01-30 LAB — BASIC METABOLIC PANEL
Anion gap: 11 (ref 5–15)
Anion gap: 11 (ref 5–15)
BUN: 68 mg/dL — ABNORMAL HIGH (ref 8–23)
BUN: 72 mg/dL — ABNORMAL HIGH (ref 8–23)
CO2: 23 mmol/L (ref 22–32)
CO2: 27 mmol/L (ref 22–32)
Calcium: 7.5 mg/dL — ABNORMAL LOW (ref 8.9–10.3)
Calcium: 7.5 mg/dL — ABNORMAL LOW (ref 8.9–10.3)
Chloride: 102 mmol/L (ref 98–111)
Chloride: 103 mmol/L (ref 98–111)
Creatinine, Ser: 3.93 mg/dL — ABNORMAL HIGH (ref 0.44–1.00)
Creatinine, Ser: 4.23 mg/dL — ABNORMAL HIGH (ref 0.44–1.00)
GFR calc Af Amer: 13 mL/min — ABNORMAL LOW (ref >60)
GFR calc Af Amer: 12 mL/min — ABNORMAL LOW (ref >60)
GFR calc non Af Amer: 11 mL/min — ABNORMAL LOW (ref >60)
GFR calc non Af Amer: 10 mL/min — ABNORMAL LOW (ref >60)
Glucose, Bld: 449 mg/dL — ABNORMAL HIGH (ref 70–99)
Glucose, Bld: 90 mg/dL (ref 70–99)
Potassium: 5.3 mmol/L — ABNORMAL HIGH (ref 3.5–5.1)
Potassium: 4.7 mmol/L (ref 3.5–5.1)
Sodium: 136 mmol/L (ref 135–145)
Sodium: 141 mmol/L (ref 135–145)

## 2020-01-30 LAB — CBC WITH DIFFERENTIAL/PLATELET
Abs Immature Granulocytes: 0.23 10*3/uL — ABNORMAL HIGH (ref 0.00–0.07)
Basophils Absolute: 0 10*3/uL (ref 0.0–0.1)
Basophils Relative: 0 %
Eosinophils Absolute: 0.1 10*3/uL (ref 0.0–0.5)
Eosinophils Relative: 0 %
HCT: 34.5 % — ABNORMAL LOW (ref 36.0–46.0)
Hemoglobin: 10.2 g/dL — ABNORMAL LOW (ref 12.0–15.0)
Immature Granulocytes: 1 %
Lymphocytes Relative: 1 %
Lymphs Abs: 0.3 10*3/uL — ABNORMAL LOW (ref 0.7–4.0)
MCH: 31.9 pg (ref 26.0–34.0)
MCHC: 29.6 g/dL — ABNORMAL LOW (ref 30.0–36.0)
MCV: 107.8 fL — ABNORMAL HIGH (ref 80.0–100.0)
Monocytes Absolute: 0.5 10*3/uL (ref 0.1–1.0)
Monocytes Relative: 2 %
Neutro Abs: 20.6 10*3/uL — ABNORMAL HIGH (ref 1.7–7.7)
Neutrophils Relative %: 96 %
Platelets: 90 10*3/uL — ABNORMAL LOW (ref 150–400)
RBC: 3.2 MIL/uL — ABNORMAL LOW (ref 3.87–5.11)
RDW: 22.1 % — ABNORMAL HIGH (ref 11.5–15.5)
WBC: 21.7 10*3/uL — ABNORMAL HIGH (ref 4.0–10.5)
nRBC: 0.4 % — ABNORMAL HIGH (ref 0.0–0.2)

## 2020-01-30 LAB — GLUCOSE, CAPILLARY
Glucose-Capillary: 113 mg/dL — ABNORMAL HIGH (ref 70–99)
Glucose-Capillary: 114 mg/dL — ABNORMAL HIGH (ref 70–99)
Glucose-Capillary: 128 mg/dL — ABNORMAL HIGH (ref 70–99)
Glucose-Capillary: 134 mg/dL — ABNORMAL HIGH (ref 70–99)
Glucose-Capillary: 154 mg/dL — ABNORMAL HIGH (ref 70–99)
Glucose-Capillary: 169 mg/dL — ABNORMAL HIGH (ref 70–99)
Glucose-Capillary: 172 mg/dL — ABNORMAL HIGH (ref 70–99)
Glucose-Capillary: 222 mg/dL — ABNORMAL HIGH (ref 70–99)
Glucose-Capillary: 272 mg/dL — ABNORMAL HIGH (ref 70–99)
Glucose-Capillary: 282 mg/dL — ABNORMAL HIGH (ref 70–99)
Glucose-Capillary: 309 mg/dL — ABNORMAL HIGH (ref 70–99)
Glucose-Capillary: 328 mg/dL — ABNORMAL HIGH (ref 70–99)
Glucose-Capillary: 370 mg/dL — ABNORMAL HIGH (ref 70–99)
Glucose-Capillary: 371 mg/dL — ABNORMAL HIGH (ref 70–99)
Glucose-Capillary: 382 mg/dL — ABNORMAL HIGH (ref 70–99)
Glucose-Capillary: 394 mg/dL — ABNORMAL HIGH (ref 70–99)
Glucose-Capillary: 394 mg/dL — ABNORMAL HIGH (ref 70–99)
Glucose-Capillary: 408 mg/dL — ABNORMAL HIGH (ref 70–99)
Glucose-Capillary: 460 mg/dL — ABNORMAL HIGH (ref 70–99)
Glucose-Capillary: 82 mg/dL (ref 70–99)
Glucose-Capillary: 89 mg/dL (ref 70–99)
Glucose-Capillary: 96 mg/dL (ref 70–99)
Glucose-Capillary: 98 mg/dL (ref 70–99)

## 2020-01-30 LAB — COMPREHENSIVE METABOLIC PANEL
ALT: 62 U/L — ABNORMAL HIGH (ref 0–44)
AST: 35 U/L (ref 15–41)
Albumin: 2.4 g/dL — ABNORMAL LOW (ref 3.5–5.0)
Alkaline Phosphatase: 85 U/L (ref 38–126)
Anion gap: 10 (ref 5–15)
BUN: 66 mg/dL — ABNORMAL HIGH (ref 8–23)
CO2: 26 mmol/L (ref 22–32)
Calcium: 7.7 mg/dL — ABNORMAL LOW (ref 8.9–10.3)
Chloride: 100 mmol/L (ref 98–111)
Creatinine, Ser: 3.71 mg/dL — ABNORMAL HIGH (ref 0.44–1.00)
GFR calc Af Amer: 14 mL/min — ABNORMAL LOW (ref >60)
GFR calc non Af Amer: 12 mL/min — ABNORMAL LOW (ref >60)
Glucose, Bld: 466 mg/dL — ABNORMAL HIGH (ref 70–99)
Potassium: 5.8 mmol/L — ABNORMAL HIGH (ref 3.5–5.1)
Sodium: 136 mmol/L (ref 135–145)
Total Bilirubin: 1.1 mg/dL (ref 0.3–1.2)
Total Protein: 5 g/dL — ABNORMAL LOW (ref 6.5–8.1)

## 2020-01-30 LAB — CULTURE, RESPIRATORY W GRAM STAIN
Culture: NORMAL
Gram Stain: NONE SEEN

## 2020-01-30 LAB — BRAIN NATRIURETIC PEPTIDE: B Natriuretic Peptide: 111.8 pg/mL — ABNORMAL HIGH (ref 0.0–100.0)

## 2020-01-30 LAB — MAGNESIUM: Magnesium: 2.8 mg/dL — ABNORMAL HIGH (ref 1.7–2.4)

## 2020-01-30 LAB — TRIGLYCERIDES: Triglycerides: 109 mg/dL (ref <150)

## 2020-01-30 LAB — D-DIMER, QUANTITATIVE: D-Dimer, Quant: 2.55 ug/mL-FEU — ABNORMAL HIGH (ref 0.00–0.50)

## 2020-01-30 LAB — PHOSPHORUS: Phosphorus: 9.1 mg/dL — ABNORMAL HIGH (ref 2.5–4.6)

## 2020-01-30 MED ORDER — SODIUM CHLORIDE 0.9 % IV SOLN: INTRAVENOUS | Status: DC | PRN

## 2020-01-30 MED ORDER — DEXTROSE 50 % IV SOLN: 0.0000 mL | INTRAVENOUS | Status: DC | PRN

## 2020-01-30 MED ORDER — DEXTROSE-NACL 5-0.45 % IV SOLN: INTRAVENOUS | Status: DC

## 2020-01-30 MED ORDER — SODIUM CHLORIDE 0.9 % IV SOLN
2.0000 g | INTRAVENOUS | Status: DC
  Administered 2020-01-30 – 2020-01-31 (×2): 2 g via INTRAVENOUS
  Filled 2020-01-30 (×2): qty 2

## 2020-01-30 MED ORDER — VECURONIUM BROMIDE 10 MG IV SOLR
0.0800 mg/kg | INTRAVENOUS | Status: DC | PRN
  Administered 2020-01-30 – 2020-02-02 (×8): 8.4 mg via INTRAVENOUS
  Filled 2020-01-30 (×9): qty 10

## 2020-01-30 MED ORDER — DEXTROSE 50 % IV SOLN
25.0000 mL | Freq: Once | INTRAVENOUS | Status: AC
  Administered 2020-01-30: 25 mL via INTRAVENOUS
  Filled 2020-01-30: qty 50

## 2020-01-30 MED ORDER — INSULIN ASPART 100 UNIT/ML IV SOLN
10.0000 [IU] | Freq: Once | INTRAVENOUS | Status: AC
  Administered 2020-01-30: 10 [IU] via INTRAVENOUS

## 2020-01-30 MED ORDER — INSULIN REGULAR(HUMAN) IN NACL 100-0.9 UT/100ML-% IV SOLN
INTRAVENOUS | Status: DC
  Administered 2020-01-30: 14 [IU]/h via INTRAVENOUS
  Administered 2020-01-30: 24 [IU]/h via INTRAVENOUS
  Filled 2020-01-30 (×2): qty 100

## 2020-01-30 MED ORDER — SODIUM BICARBONATE 8.4 % IV SOLN
50.0000 meq | Freq: Once | INTRAVENOUS | Status: AC
  Administered 2020-01-30: 50 meq via INTRAVENOUS
  Filled 2020-01-30: qty 50

## 2020-01-30 MED ORDER — METHYLPREDNISOLONE SODIUM SUCC 40 MG IJ SOLR
40.0000 mg | Freq: Two times a day (BID) | INTRAMUSCULAR | Status: DC
  Administered 2020-01-30 – 2020-02-02 (×6): 40 mg via INTRAVENOUS
  Filled 2020-01-30 (×6): qty 1

## 2020-01-30 MED ORDER — ENOXAPARIN SODIUM 30 MG/0.3ML ~~LOC~~ SOLN
30.0000 mg | Freq: Every day | SUBCUTANEOUS | Status: DC
  Administered 2020-01-31 – 2020-02-01 (×2): 30 mg via SUBCUTANEOUS
  Filled 2020-01-30 (×2): qty 0.3

## 2020-01-30 MED ORDER — SODIUM CHLORIDE 0.9 % IV SOLN: INTRAVENOUS | Status: DC

## 2020-01-30 MED ORDER — STERILE WATER FOR INJECTION IV SOLN
INTRAVENOUS | Status: DC
  Filled 2020-01-30 (×4): qty 850

## 2020-01-30 MED ORDER — INSULIN ASPART 100 UNIT/ML ~~LOC~~ SOLN
0.0000 [IU] | SUBCUTANEOUS | Status: DC
  Administered 2020-01-30: 20 [IU] via SUBCUTANEOUS

## 2020-01-30 MED ORDER — INSULIN REGULAR(HUMAN) IN NACL 100-0.9 UT/100ML-% IV SOLN
INTRAVENOUS | Status: DC
  Administered 2020-01-30: 10.5 [IU]/h via INTRAVENOUS
  Filled 2020-01-30: qty 100

## 2020-01-30 MED ORDER — SODIUM ZIRCONIUM CYCLOSILICATE 10 G PO PACK
10.0000 g | PACK | Freq: Once | ORAL | Status: AC
  Administered 2020-01-30: 10 g
  Filled 2020-01-30: qty 1

## 2020-01-30 MED ORDER — HEPARIN SODIUM (PORCINE) 1000 UNIT/ML DIALYSIS
1000.0000 [IU] | INTRAMUSCULAR | Status: DC | PRN
  Administered 2020-01-30: 2400 [IU] via INTRAVENOUS_CENTRAL
  Filled 2020-01-30: qty 3
  Filled 2020-01-30 (×2): qty 6

## 2020-01-30 MED ORDER — ARTIFICIAL TEARS OPHTHALMIC OINT
1.0000 "application " | TOPICAL_OINTMENT | Freq: Three times a day (TID) | OPHTHALMIC | Status: DC
  Administered 2020-01-30 – 2020-02-02 (×9): 1 via OPHTHALMIC
  Filled 2020-01-30: qty 3.5

## 2020-01-30 NOTE — Procedures (Signed)
Arterial Catheter Insertion Procedure Note [name] [id number] 16-Dec-1955  Procedure: Insertion of Arterial Catheter  Indications: Frequent blood sampling  Procedure Details Consent: Unable to obtain consent because of altered level of consciousness. Time Out: Verified patient identification, verified procedure, site/side was marked, verified correct patient position, special equipment/implants available, medications/allergies/relevent history reviewed, required imaging and test results available.  Performed  Maximum sterile technique was used including antiseptics, cap, gloves, gown, hand hygiene, mask and sheet. Skin prep: Chlorhexidine; local anesthetic administered 20 gauge catheter was inserted into left radial artery using the Seldinger technique. ULTRASOUND GUIDANCE USED: NO Evaluation Blood flow good; BP tracing good. Complications: No apparent complications.   [place], [place] 01/30/2020

## 2020-01-30 NOTE — Progress Notes (Signed)
NAME:  [name], MRN:  [id number], DOB:  1956-04-06, LOS: 27 ADMISSION DATE:  12/19/2019, CONSULTATION DATE:  5/11 REFERRING MD:  Dr Candiss Norse, CHIEF COMPLAINT:  Hypoxia   Brief History   64 year old female admitted 4/26 for COVID pneumonia and CHF. Initially improved with the usual treatment. Was on room air. 5/8 developed worsening hypoxia that was progressive despite diuresis. As of 5/11 required NRB and HFNC at 35LPM and became lethargic. PCCM consulted.     Past Medical History   has a past medical history of Achilles rupture, Anxiety, Arthritis, Asthma, Chronic back pain, Chronic diastolic heart failure ([place]) (11/30/2016), Chronic headaches, Chronic leg pain, CHRONIC OBSTRUCTIVE PULMONARY DISEASE, MODERATE (03/26/2010), Depression, Diabetes mellitus (1999), Glaucoma, Gout, H/O gestational diabetes mellitus, not currently pregnant, Hyperlipidemia, Hypertension, Morbid obesity ([place]), Obstructive sleep apnea on CPAP (07/02/2009), OSA (obstructive sleep apnea), and Sciatica.   Significant Hospital Events   4/18-4/23>> admit to [facility]-s/p stent to left distal SFA 4/27>> admit to [facility] for hypoxia from Covid/heart failure 4/27>> lower extremity Dopplers negative for DVT. Q000111Q grade 2 diastolic dysfunction, moderate RV systolic dysfunction.  RVSP 62.6 mmHg. 4/30>> VQ scan-low probability PE 5/5>> improved-on room air 5/8>> worsening hypoxemia-on 15 L of oxygen-with dramatic response to Lasix-down to 4 L 5/10 -5/12 once again requiring oxygen. Had been down to room air prior to this. Oxygen requirements progressed to point where she required intubation. 5/10 weight was up significantly. 106.4 to 109.7 Kg. 5/12 intubated. ABX started. Lasix escalated. LE [place] repeated and was placed on therapeutic LMWH. Post intubation film w/ small pneumomediastinum. Tube feeds started. Pulse steroids started Consults:    Procedures:  5/12 ETT >> RUE PICC 5/12>> BL chest tubes 5/13 >>  Significant Diagnostic  Tests:  Lower extremity doppler 4/27 > no evidence of DVT Echo 4/29 > Grade II DD, moderate reduction of RV systolic function.  V/Q 4/30 > No evidence PE US renal 4/30 > normal size kidneys, no hydro.   Micro Data:  COVID pos 4/27  Flu neg 4/27  Blood cx neg 4/28 > 5/12 BC >> neg 5/12 resp >> GPC pairs >> nml flora  Antimicrobials:  Cefepime 5/12 > Vancomycin  5/12 >zyvox (changed due to cr)>>> 5/12 >> 514  Interim history/subjective:   Critically ill, intubated Sedated on fentanyl and propofol Anuric   Objective   Blood pressure (!) 124/51, pulse 75, temperature 97.7 F (36.5 C), temperature source Axillary, resp. rate (!) 44, height 5\' 3"  (1.6 m), weight 104.4 kg, last menstrual period 02/16/2017, SpO2 97 %. CVP:  [11 mmHg-16 mmHg] 11 mmHg  Vent Mode: PRVC FiO2 (%):  [70 %-90 %] 70 % Set Rate:  [35 bmp] 35 bmp Vt Set:  [320 mL] 320 mL PEEP:  [12 cmH20-14 cmH20] 14 cmH20 Plateau Pressure:  [30 cmH20-35 cmH20] 32 cmH20   Intake/Output Summary (Last 24 hours) at 01/30/2020 1115 Last data filed at 01/30/2020 0700 Gross per 24 hour  Intake 2581.74 ml  Output 299 ml  Net 2282.74 ml   Filed Weights   01/27/20 0551 01/28/20 0500 01/30/20 0322  Weight: 103.5 kg 99.5 kg 104.4 kg    Examination:  General: Morbidly obese female , deeply sedated, intubated HEENT: Oral ETT Neuro: RASS -4 CV: Sinus rhythm PULM: Diminished throughout , no air leak on bilateral chest tubes, mild bleeding around insertion sites, severe vent asynchrony GI: soft, bsx4 active  GU: Amber urine Extremities: warm/dry, negative edema  Skin: no rashes or lesions   Chest x-ray  5/14 personally reviewed which shows small right apical pneumothorax, bilateral chest tubes in position, stable bilateral airspace disease  Labs show mild hyperkalemia, elevated glucose, slight rise in creatinine   Resolved Hospital Problem list     Assessment & Plan:   ARDS due to COVID 19 pneumonia: positive test  4/27. S/p Remdesivir, decadron, actemra .  Interestingly oxygen requirements initially improved and then worsened>> not typical course for Covid pneumonia but no other cause identified Acute on chronic HFpEF  COPD +/- acute exacerbation OSA: non-complaint with CPAP  Plan Low tidal volume ventilation, driving pressure slight high 18 cm Due to severe vent asynchrony, add intermittent vecuronium Drop FiO2 as able Maintain goal RASS -5 Completed remdesivir, Decrease Solu-Medrol 40 daily for persistent hypoxia  Bilateral small pneumothoraces chest x-ray 08/30/2020 Pneumomediastinum   -No air leak in bilateral chest tubes, continue to suction and flush per protocol -Try to limit PEEP to 14  Shock, likely related to sedation versus sepsis not present on admission Bradycardia -2/2 propofol Plan Continue telemetry Levophed with goal MAP 65 and above   AKI CKD stage 3b Looks like baseline cr ~1.5 to 2.5 even dating back 2 yrs ago. Currently at baseline  Hyperkalemia  Plan Hold Lasix Has received cocktail, may need RRT but no acute indication for dialysis yet   Uncontrolled DM w/ labile glycemic control (drops in am) Plan Continue sliding scale insulin protocol Ct  insulin drip-expect requirements to decrease as steroids lowered   Elevated D Dimer: likely due to COVID. VTE essentially ruled out dopplers and VQ scan completed on 4/30 Plan Prophylactic dose Lovenox  PAD: s/p recent stent to LLE Plan Continue aspirin and Plavix    Summary  -somewhat atypical course with hypoxia initially improving with diuresis but then developing progressive bilateral infiltrates with ARDS range hypoxia, barotrauma and now AKI with impending RRT , treating for HAP   Best practice:  Diet: NPO Pain/Anxiety/Delirium protocol (if indicated): Propofol/fentanyl, goal RASS -5 VAP protocol (if indicated): Yes DVT prophylaxis: Lovenox GI prophylaxis: PPI Glucose control: SSI Mobility: BR Code  Status: FULL Family Communication: Update daughter daily Disposition: ICU    The patient is critically ill with multiple organ systems failure and requires high complexity decision making for assessment and support, frequent evaluation and titration of therapies, application of advanced monitoring technologies and extensive interpretation of multiple databases. Critical Care Time devoted to patient care services described in this note independent of APP/resident  time is 35 minutes.   Kara Mead MD. [place] Flood. [facility]  If no response to pager , please call [phone]   01/30/2020

## 2020-01-30 NOTE — Progress Notes (Signed)
Pt on endotool. FSBS ranging 300's-90's, infomed Dr. Elsworth Soho about notice from endotool to start Dextrose. Adviced by MD to continue endotool without adding dextrose and if endotool prompts to turn off insulin gtt due to low BS to call and get SSI orders to transition off insulin gtt with lantus coverage

## 2020-01-30 NOTE — Progress Notes (Signed)
Inpatient Diabetes Program Recommendations  AACE/ADA: New Consensus Statement on Inpatient Glycemic Control (2015)  Target Ranges:  Prepandial:   less than 140 mg/dL      Peak postprandial:   less than 180 mg/dL (1-2 hours)      Critically ill patients:  140 - 180 mg/dL   Lab Results  Component Value Date   GLUCAP 282 (H) 01/30/2020   HGBA1C 12.2 (H) 01/05/2020    Review of Glycemic Control Results for [name], [name] (MRN [id number]) as of 01/30/2020 10:09  Ref. Range 01/30/2020 06:54 01/30/2020 07:46 01/30/2020 08:47 01/30/2020 09:50  Glucose-Capillary Latest Ref Range: 70 - 99 mg/dL 394 (H) 328 (H) 309 (H) 282 (H)   Diabetes history:DM 2 Outpatient Diabetes medications:Lantus 50 units qam, Novolog 3-20 units tid with meals for glucose > 150 mg/dl Current orders for Inpatient glycemic control:IV insulin Solumedrol 40 mg BID Vital 1.5 Cal @ 50 ml/hr  Inpatient Diabetes Program Recommendations:    Noted patient was transitioned yesterday off IV insulin, then experienced hyperglycemia and IV insulin was started this AM.   Patient was transitioned correctly with Levemir 18 units BID, Novolog 5 units Q4H for tube feed coverage and Novolog 3-9 Q4H. Doses seemed appropriate based on ICU phase 3 order set recommendations and correlates closely with Covid order set recommendations. Assuming insulin needs are exceeding expectation due to lack of absorption given clinical picture.  Continue with IV insulin.   Thanks, Bronson Curb, MSN, RNC-OB Diabetes Coordinator [phone] (8a-5p)

## 2020-01-30 NOTE — Progress Notes (Signed)
eLink Physician-Brief Progress Note Patient Name: [name] DOB: 01-Feb-1956 MRN: [id number]   Date of Service  01/30/2020  HPI/Events of Note  Notified of K 5.9 Insulin drip already resumed due to persistently elevated glucose Already receiving furosemide without response Creatinine trending up  eICU Interventions  Ordered hyperkalemia protocol May need RRT        Shona Needles Mazey Mantell 01/30/2020, 6:03 AM

## 2020-01-30 NOTE — Procedures (Signed)
Hemodialysis Catheter Insertion Procedure Note Hagen Cancilla [id number] 02/10/1956  Procedure: Insertion of Hemodialysis Catheter Indications: Dialysis Access   Procedure Details Consent: Risks of procedure as well as the alternatives and risks of each were explained to the (patient/caregiver).  Consent for procedure obtained.   Time Out: Verified patient identification, verified procedure, site/side was marked, verified correct patient position, special equipment/implants available, medications/allergies/relevent history reviewed, required imaging and test results available.  Performed  Maximum sterile technique was used including antiseptics, cap, gloves, gown, hand hygiene, mask and sheet.  Skin prep: Chlorhexidine; local anesthetic administered Triple lumen hemodialysis catheter was inserted into right internal jugular vein using the Seldinger technique.  Biopatch applied, sutured in place.   Evaluation Blood flow good Complications: No apparent complications Patient did tolerate procedure well. Chest X-ray ordered to verify placement.  CXR: pending.   Procedure performed under direct supervision of Dr. Elsworth Soho and with ultrasound guidance for real time vessel cannulation.     Noe Gens, MSN, NP-C [facility] 01/30/2020, 3:58 PM   Please see [email or web address] for pager details.

## 2020-01-30 NOTE — Progress Notes (Signed)
Called in ABG results for patient to Lysbeth Galas, Therapist, sports at [facility].

## 2020-01-30 NOTE — Progress Notes (Signed)
eLink Physician-Brief Progress Note Patient Name: Dash Gally DOB: 1955-11-25 MRN: [id number]   Date of Service  01/30/2020  HPI/Events of Note  Notified of oliguria despite diuresis. CVP 16. On norepinephrine without increase in dose  eICU Interventions  Bladder scan to be done. No other intervention at this time     Intervention Category Intermediate Interventions: Oliguria - evaluation and management  Judd Lien 01/30/2020, 3:47 AM

## 2020-01-30 NOTE — Progress Notes (Signed)
[facility] Progress Note Patient Name: Indira Wesby DOB: 1956/05/18 MRN: [id number]   Date of Service  01/30/2020  HPI/Events of Note  ABG on 80%/PRVC 35/TV 380/P 14 = 7.244/65.1/91. The pH and pCO2 are actually improved from the 5 PM ABG. No real room to push PRVC rate to TV up. Patient is currently on a NaHCO3 IV infusion.   eICU Interventions  Continue current ventilator management.      Intervention Category Major Interventions: Respiratory failure - evaluation and management;Acid-Base disturbance - evaluation and management  Olivette Beckmann [place] 01/30/2020, 10:20 PM

## 2020-01-30 NOTE — Progress Notes (Signed)
PT Cancellation Note  Patient Details Name: [name] MRN: [id number] DOB: 12-05-1955   Cancelled Treatment:    Reason Eval/Treat Not Completed: Medical issues which prohibited therapy.  On vent with paralytics.  Will await next recheck until Monday. 01/30/2020  Ginger Carne., PT Acute Rehabilitation Services [phone]  (pager) [phone]  (office)   Tessie Fass [name] 01/30/2020, 9:50 AM

## 2020-01-30 NOTE — Progress Notes (Signed)
eLink Physician-Brief Progress Note Patient Name: [name] DOB: [birth year] MRN: [id number]   Date of Service  01/30/2020  HPI/Events of Note  Notified of glucose > 300, off insulin drip and started on Levemir 18, fixed doe regular insulin 5 q 4 and resistant dose SSI but goes only to 250  eICU Interventions  Adjusted resistant dose sliding scale to increase range before transitioning back to insulin drip if needed     Intervention Category Major Interventions: Hyperglycemia - active titration of insulin therapy  Judd Lien 01/30/2020, 1:42 AM

## 2020-01-30 NOTE — Progress Notes (Signed)
Susy [place], RN at [facility] and gave her ABG results to give MD.  Will continue to monitor.

## 2020-01-30 NOTE — Progress Notes (Signed)
Spoke with Dr. Oletta Darter at [facility] about putting in [place] in patient.  He said to try it and see if we can get it in, he gave verbal agreement.  Will place order for him to sign.

## 2020-01-31 ENCOUNTER — Inpatient Hospital Stay (HOSPITAL_COMMUNITY): Payer: BC Managed Care – PPO

## 2020-01-31 LAB — GLUCOSE, CAPILLARY
Glucose-Capillary: 109 mg/dL — ABNORMAL HIGH (ref 70–99)
Glucose-Capillary: 136 mg/dL — ABNORMAL HIGH (ref 70–99)
Glucose-Capillary: 137 mg/dL — ABNORMAL HIGH (ref 70–99)
Glucose-Capillary: 157 mg/dL — ABNORMAL HIGH (ref 70–99)
Glucose-Capillary: 158 mg/dL — ABNORMAL HIGH (ref 70–99)
Glucose-Capillary: 161 mg/dL — ABNORMAL HIGH (ref 70–99)
Glucose-Capillary: 162 mg/dL — ABNORMAL HIGH (ref 70–99)
Glucose-Capillary: 164 mg/dL — ABNORMAL HIGH (ref 70–99)
Glucose-Capillary: 165 mg/dL — ABNORMAL HIGH (ref 70–99)
Glucose-Capillary: 165 mg/dL — ABNORMAL HIGH (ref 70–99)
Glucose-Capillary: 166 mg/dL — ABNORMAL HIGH (ref 70–99)
Glucose-Capillary: 200 mg/dL — ABNORMAL HIGH (ref 70–99)
Glucose-Capillary: 233 mg/dL — ABNORMAL HIGH (ref 70–99)
Glucose-Capillary: 325 mg/dL — ABNORMAL HIGH (ref 70–99)
Glucose-Capillary: 331 mg/dL — ABNORMAL HIGH (ref 70–99)
Glucose-Capillary: 73 mg/dL (ref 70–99)

## 2020-01-31 LAB — CBC WITH DIFFERENTIAL/PLATELET
Abs Immature Granulocytes: 0.37 10*3/uL — ABNORMAL HIGH (ref 0.00–0.07)
Basophils Absolute: 0 10*3/uL (ref 0.0–0.1)
Basophils Relative: 0 %
Eosinophils Absolute: 0.3 10*3/uL (ref 0.0–0.5)
Eosinophils Relative: 1 %
HCT: 32.1 % — ABNORMAL LOW (ref 36.0–46.0)
Hemoglobin: 9.9 g/dL — ABNORMAL LOW (ref 12.0–15.0)
Immature Granulocytes: 2 %
Lymphocytes Relative: 3 %
Lymphs Abs: 0.7 10*3/uL (ref 0.7–4.0)
MCH: 32.2 pg (ref 26.0–34.0)
MCHC: 30.8 g/dL (ref 30.0–36.0)
MCV: 104.6 fL — ABNORMAL HIGH (ref 80.0–100.0)
Monocytes Absolute: 1.6 10*3/uL — ABNORMAL HIGH (ref 0.1–1.0)
Monocytes Relative: 8 %
Neutro Abs: 17.7 10*3/uL — ABNORMAL HIGH (ref 1.7–7.7)
Neutrophils Relative %: 86 %
Platelets: 67 10*3/uL — ABNORMAL LOW (ref 150–400)
RBC: 3.07 MIL/uL — ABNORMAL LOW (ref 3.87–5.11)
RDW: 22.8 % — ABNORMAL HIGH (ref 11.5–15.5)
WBC: 20.6 10*3/uL — ABNORMAL HIGH (ref 4.0–10.5)
nRBC: 0.9 % — ABNORMAL HIGH (ref 0.0–0.2)

## 2020-01-31 LAB — COMPREHENSIVE METABOLIC PANEL
ALT: 60 U/L — ABNORMAL HIGH (ref 0–44)
AST: 39 U/L (ref 15–41)
Albumin: 2.3 g/dL — ABNORMAL LOW (ref 3.5–5.0)
Alkaline Phosphatase: 86 U/L (ref 38–126)
Anion gap: 11 (ref 5–15)
BUN: 82 mg/dL — ABNORMAL HIGH (ref 8–23)
CO2: 27 mmol/L (ref 22–32)
Calcium: 7.7 mg/dL — ABNORMAL LOW (ref 8.9–10.3)
Chloride: 101 mmol/L (ref 98–111)
Creatinine, Ser: 4.79 mg/dL — ABNORMAL HIGH (ref 0.44–1.00)
GFR calc Af Amer: 10 mL/min — ABNORMAL LOW (ref >60)
GFR calc non Af Amer: 9 mL/min — ABNORMAL LOW (ref >60)
Glucose, Bld: 176 mg/dL — ABNORMAL HIGH (ref 70–99)
Potassium: 5 mmol/L (ref 3.5–5.1)
Sodium: 139 mmol/L (ref 135–145)
Total Bilirubin: 0.9 mg/dL (ref 0.3–1.2)
Total Protein: 4.9 g/dL — ABNORMAL LOW (ref 6.5–8.1)

## 2020-01-31 LAB — TRIGLYCERIDES: Triglycerides: 134 mg/dL (ref <150)

## 2020-01-31 LAB — BASIC METABOLIC PANEL
Anion gap: 9 (ref 5–15)
BUN: 91 mg/dL — ABNORMAL HIGH (ref 8–23)
CO2: 30 mmol/L (ref 22–32)
Calcium: 7.6 mg/dL — ABNORMAL LOW (ref 8.9–10.3)
Chloride: 98 mmol/L (ref 98–111)
Creatinine, Ser: 4.84 mg/dL — ABNORMAL HIGH (ref 0.44–1.00)
GFR calc Af Amer: 10 mL/min — ABNORMAL LOW (ref >60)
GFR calc non Af Amer: 9 mL/min — ABNORMAL LOW (ref >60)
Glucose, Bld: 341 mg/dL — ABNORMAL HIGH (ref 70–99)
Potassium: 5.5 mmol/L — ABNORMAL HIGH (ref 3.5–5.1)
Sodium: 137 mmol/L (ref 135–145)

## 2020-01-31 LAB — POCT I-STAT 7, (LYTES, BLD GAS, ICA,H+H)
Acid-Base Excess: 1 mmol/L (ref 0.0–2.0)
Bicarbonate: 29.1 mmol/L — ABNORMAL HIGH (ref 20.0–28.0)
Calcium, Ion: 1.09 mmol/L — ABNORMAL LOW (ref 1.15–1.40)
HCT: 30 % — ABNORMAL LOW (ref 36.0–46.0)
Hemoglobin: 10.2 g/dL — ABNORMAL LOW (ref 12.0–15.0)
O2 Saturation: 98 %
Patient temperature: 97.8
Potassium: 4.8 mmol/L (ref 3.5–5.1)
Sodium: 137 mmol/L (ref 135–145)
TCO2: 31 mmol/L (ref 22–32)
pCO2 arterial: 66.6 mmHg (ref 32.0–48.0)
pH, Arterial: 7.246 — ABNORMAL LOW (ref 7.350–7.450)
pO2, Arterial: 115 mmHg — ABNORMAL HIGH (ref 83.0–108.0)

## 2020-01-31 LAB — BRAIN NATRIURETIC PEPTIDE
B Natriuretic Peptide: 147.6 pg/mL — ABNORMAL HIGH (ref 0.0–100.0)
B Natriuretic Peptide: 146.7 pg/mL — ABNORMAL HIGH (ref 0.0–100.0)

## 2020-01-31 LAB — D-DIMER, QUANTITATIVE: D-Dimer, Quant: 1.95 ug/mL-FEU — ABNORMAL HIGH (ref 0.00–0.50)

## 2020-01-31 MED ORDER — INSULIN DETEMIR 100 UNIT/ML ~~LOC~~ SOLN
12.0000 [IU] | Freq: Every day | SUBCUTANEOUS | Status: DC
  Administered 2020-01-31 – 2020-02-01 (×2): 12 [IU] via SUBCUTANEOUS
  Filled 2020-01-31 (×2): qty 0.12

## 2020-01-31 MED ORDER — INSULIN DETEMIR 100 UNIT/ML ~~LOC~~ SOLN
12.0000 [IU] | Freq: Every day | SUBCUTANEOUS | Status: DC
  Filled 2020-01-31: qty 0.12

## 2020-01-31 MED ORDER — ATORVASTATIN CALCIUM 40 MG PO TABS
40.0000 mg | ORAL_TABLET | Freq: Every day | ORAL | Status: DC
  Administered 2020-01-31 – 2020-02-05 (×6): 40 mg
  Filled 2020-01-31 (×5): qty 1

## 2020-01-31 MED ORDER — POLYETHYLENE GLYCOL 3350 17 G PO PACK
17.0000 g | PACK | Freq: Every day | ORAL | Status: DC
  Administered 2020-01-31 – 2020-02-01 (×2): 17 g
  Filled 2020-01-31: qty 1

## 2020-01-31 MED ORDER — INSULIN ASPART 100 UNIT/ML ~~LOC~~ SOLN
0.0000 [IU] | SUBCUTANEOUS | Status: DC
  Administered 2020-01-31: 15 [IU] via SUBCUTANEOUS
  Administered 2020-01-31: 7 [IU] via SUBCUTANEOUS
  Administered 2020-01-31: 4 [IU] via SUBCUTANEOUS
  Administered 2020-01-31: 15 [IU] via SUBCUTANEOUS
  Administered 2020-02-01: 20 [IU] via SUBCUTANEOUS
  Administered 2020-02-01 (×2): 11 [IU] via SUBCUTANEOUS
  Administered 2020-02-01: 7 [IU] via SUBCUTANEOUS
  Administered 2020-02-01: 15 [IU] via SUBCUTANEOUS
  Administered 2020-02-01: 20 [IU] via SUBCUTANEOUS
  Administered 2020-02-02: 11 [IU] via SUBCUTANEOUS
  Administered 2020-02-02: 4 [IU] via SUBCUTANEOUS
  Administered 2020-02-02: 7 [IU] via SUBCUTANEOUS
  Administered 2020-02-02: 11 [IU] via SUBCUTANEOUS
  Administered 2020-02-02 (×2): 4 [IU] via SUBCUTANEOUS
  Administered 2020-02-03: 3 [IU] via SUBCUTANEOUS
  Administered 2020-02-03: 4 [IU] via SUBCUTANEOUS
  Administered 2020-02-03: 3 [IU] via SUBCUTANEOUS
  Administered 2020-02-03 (×2): 4 [IU] via SUBCUTANEOUS
  Administered 2020-02-03: 11 [IU] via SUBCUTANEOUS
  Administered 2020-02-04: 7 [IU] via SUBCUTANEOUS
  Administered 2020-02-04: 11 [IU] via SUBCUTANEOUS
  Administered 2020-02-04: 7 [IU] via SUBCUTANEOUS
  Administered 2020-02-04: 11 [IU] via SUBCUTANEOUS
  Administered 2020-02-04: 7 [IU] via SUBCUTANEOUS
  Administered 2020-02-05 (×3): 11 [IU] via SUBCUTANEOUS

## 2020-01-31 MED ORDER — ASCORBIC ACID 500 MG PO TABS
500.0000 mg | ORAL_TABLET | Freq: Every day | ORAL | Status: DC
  Administered 2020-01-31 – 2020-02-05 (×6): 500 mg
  Filled 2020-01-31 (×5): qty 1

## 2020-01-31 MED ORDER — ACETAMINOPHEN 325 MG PO TABS: 650.0000 mg | ORAL_TABLET | Freq: Four times a day (QID) | ORAL | Status: DC | PRN

## 2020-01-31 MED ORDER — NOREPINEPHRINE 16 MG/250ML-% IV SOLN
2.0000 ug/min | INTRAVENOUS | Status: DC
  Administered 2020-01-31 – 2020-02-01 (×2): 10 ug/min via INTRAVENOUS
  Filled 2020-01-31 (×2): qty 250

## 2020-01-31 NOTE — Progress Notes (Signed)
Patient belongings picked up by her daughter Juluis Rainier.  It included 2 cellphones, charger, jacket, shoes, clothes, and depends.

## 2020-01-31 NOTE — Progress Notes (Addendum)
NAME:  [name], MRN:  [id number], DOB:  [birth year], LOS: 66 ADMISSION DATE:  12/19/2019, CONSULTATION DATE:  5/11 REFERRING MD:  Dr Candiss Norse, CHIEF COMPLAINT:  Hypoxia   Brief History   64 year old female admitted 4/26 for COVID pneumonia and CHF. Initially improved with the usual treatment. Was on room air. 5/8 developed worsening hypoxia that was progressive despite diuresis. As of 5/11 required NRB and HFNC at 35LPM and became lethargic. PCCM consulted.     Past Medical History   has a past medical history of Achilles rupture, Anxiety, Arthritis, Asthma, Chronic back pain, Chronic diastolic heart failure ([place]) (11/30/2016), Chronic headaches, Chronic leg pain, CHRONIC OBSTRUCTIVE PULMONARY DISEASE, MODERATE (03/26/2010), Depression, Diabetes mellitus (1999), Glaucoma, Gout, H/O gestational diabetes mellitus, not currently pregnant, Hyperlipidemia, Hypertension, Morbid obesity ([place]), Obstructive sleep apnea on CPAP (07/02/2009), OSA (obstructive sleep apnea), and Sciatica.   Significant Hospital Events   4/18-4/23>> admit to [facility]-s/p stent to left distal SFA 4/27>> admit to [facility] for hypoxia from Covid/heart failure 4/27>> lower extremity Dopplers negative for DVT. [id number] grade 2 diastolic dysfunction, moderate RV systolic dysfunction.  RVSP 62.6 mmHg. 4/30>> VQ scan-low probability PE 5/5>> improved-on room air 5/8>> worsening hypoxemia-on 15 L of oxygen-with dramatic response to Lasix-down to 4 L 5/10 -5/12 once again requiring oxygen. Had been down to room air prior to this. Oxygen requirements progressed to point where she required intubation. 5/10 weight was up significantly. 106.4 to 109.7 Kg. 5/12 intubated. ABX started. Lasix escalated. LE [place] repeated and was placed on therapeutic LMWH. Post intubation film w/ small pneumomediastinum. Tube feeds started. Pulse steroids started Consults:    Procedures:  5/12 ETT >> RUE PICC 5/12>> BL chest tubes 5/13 >>  Significant Diagnostic  Tests:  Lower extremity doppler 4/27 > no evidence of DVT Echo 4/29 > Grade II DD, moderate reduction of RV systolic function.  V/Q 4/30 > No evidence PE US renal 4/30 > normal size kidneys, no hydro.   Micro Data:  COVID pos 4/27  Flu neg 4/27  Blood cx neg 4/28 > 5/12 BC >> neg 5/12 resp >> GPC pairs >> nml flora  Antimicrobials:  Cefepime 5/12 > Vancomycin  5/12 >zyvox (changed due to cr)>>> 5/12 >> 514  Interim history/subjective:   Critically ill, intubated Sedated on fentanyl and propofol Nearly anuric   Objective   Blood pressure (!) 153/41, pulse 60, temperature (!) 97 F (36.1 C), temperature source Axillary, resp. rate 19, height 5\' 3"  (1.6 m), weight 108.5 kg, last menstrual period 02/16/2017, SpO2 99 %. CVP:  [7 mmHg-12 mmHg] 7 mmHg  Vent Mode: PRVC FiO2 (%):  [60 %-80 %] 60 % Set Rate:  [35 bmp] 35 bmp Vt Set:  [320 mL-380 mL] 380 mL PEEP:  [14 cmH20] 14 cmH20 Plateau Pressure:  [38 cmH20-43 cmH20] 38 cmH20   Intake/Output Summary (Last 24 hours) at 01/31/2020 1132 Last data filed at 01/31/2020 1100 Gross per 24 hour  Intake 4760.21 ml  Output 40 ml  Net 4720.21 ml   Filed Weights   01/28/20 0500 01/30/20 0322 01/31/20 0500  Weight: 99.5 kg 104.4 kg 108.5 kg    Examination:  General: Morbidly obese female , deeply sedated, intubated HEENT: Oral ETT, AT/[place] Neuro: RASS -4; PERRL, gag, cough,  CV: Sinus rhythm PULM: Diminished throughout , no air leak on bilateral chest tubes, mild bleeding around insertion sites,  GI: soft, bsx4 active  GU: Amber urine Extremities: warm/dry, no edema  Skin: no rashes or lesions  Labs show mild hyperkalemia, elevated glucose, slight rise in creatinine   Resolved Hospital Problem list     Assessment & Plan:   ARDS due to COVID 19 pneumonia: positive test 4/27. S/p Remdesivir, decadron, actemra .  Interestingly oxygen requirements initially improved and then worsened>> not typical course for Covid  pneumonia but no other cause identified Acute on chronic HFpEF  COPD +/- acute exacerbation OSA: non-complaint with CPAP  Plan Low tidal volume ventilation, driving pressure slight high 18 cm Due to severe vent asynchrony, add intermittent vecuronium Drop FiO2 as able Maintain goal RASS -5 Completed remdesivir, Decrease Solu-Medrol 40 daily for persistent hypoxia  Bilateral small pneumothoraces chest x-ray 08/30/2020 Pneumomediastinum   -No air leak in bilateral chest tubes, continue to suction and flush per protocol -Try to limit PEEP to 14  Shock, likely related to sedation versus sepsis not present on admission Bradycardia -2/2 propofol Plan Continue telemetry Levophed with goal MAP 65 and above; wide pulse pressure   AKI CKD stage 3b  baseline cr ~1.5 to 2.5 even dating back 2 yrs ago. Worsening BUN/Cr, decreasing UOP; HD cath placed 5/14, will consult nephrology for assistance today ?dialysis for worsening uremia, lytes, fluid balance in setting of acute on chronic CHF; no interstitial/extremity edema... -treat hyperkalemia as needed -   Plan Hold Lasix Has received cocktail, may need RRT but no acute indication for dialysis yet   Uncontrolled DM w/ labile glycemic control (drops in am) Plan Continue sliding scale insulin protocol Ct  insulin drip-expect requirements to decrease as steroids lowered; transition to levimir, SSI today   Elevated D Dimer: likely due to COVID. VTE essentially ruled out dopplers and VQ scan completed on 4/30 Plan Prophylactic dose Lovenox  PAD: s/p recent stent to LLE Plan Continue aspirin and Plavix  Thrombocytopenia: Monitor; seems unlikely related to lovenox but can leave on plavix monotherapy if declines; transfuse if bleeding     Summary  -somewhat atypical course with hypoxia initially improving with diuresis but then developing progressive bilateral infiltrates with ARDS range hypoxia, barotrauma and now AKI with  impending RRT , treating for HAP   Best practice:  Diet: start TF Pain/Anxiety/Delirium protocol (if indicated): Propofol/fentanyl, goal RASS -5 VAP protocol (if indicated): Yes DVT prophylaxis: Lovenox GI prophylaxis: PPI Glucose control: SSI Mobility: BR Code Status: FULL Family Communication: Update daughter 5/14 Disposition: ICU    The patient is critically ill with multiple organ systems failure and requires high complexity decision making for assessment and support, frequent evaluation and titration of therapies, application of advanced monitoring technologies and extensive interpretation of multiple databases. Critical Care Time devoted to patient care services described in this note independent of APP/resident  time is 46 minutes.   Bonna Gains, MD, PhD  01/31/2020 11:41 AM

## 2020-02-01 ENCOUNTER — Inpatient Hospital Stay (HOSPITAL_COMMUNITY): Payer: BC Managed Care – PPO

## 2020-02-01 LAB — RENAL FUNCTION PANEL
Albumin: 2.2 g/dL — ABNORMAL LOW (ref 3.5–5.0)
Albumin: 2.1 g/dL — ABNORMAL LOW (ref 3.5–5.0)
Anion gap: 17 — ABNORMAL HIGH (ref 5–15)
Anion gap: 16 — ABNORMAL HIGH (ref 5–15)
BUN: 110 mg/dL — ABNORMAL HIGH (ref 8–23)
BUN: 111 mg/dL — ABNORMAL HIGH (ref 8–23)
CO2: 27 mmol/L (ref 22–32)
CO2: 29 mmol/L (ref 22–32)
Calcium: 7.8 mg/dL — ABNORMAL LOW (ref 8.9–10.3)
Calcium: 7.8 mg/dL — ABNORMAL LOW (ref 8.9–10.3)
Chloride: 94 mmol/L — ABNORMAL LOW (ref 98–111)
Chloride: 95 mmol/L — ABNORMAL LOW (ref 98–111)
Creatinine, Ser: 5.26 mg/dL — ABNORMAL HIGH (ref 0.44–1.00)
Creatinine, Ser: 5.02 mg/dL — ABNORMAL HIGH (ref 0.44–1.00)
GFR calc Af Amer: 9 mL/min — ABNORMAL LOW (ref >60)
GFR calc Af Amer: 10 mL/min — ABNORMAL LOW (ref >60)
GFR calc non Af Amer: 8 mL/min — ABNORMAL LOW (ref >60)
GFR calc non Af Amer: 9 mL/min — ABNORMAL LOW (ref >60)
Glucose, Bld: 406 mg/dL — ABNORMAL HIGH (ref 70–99)
Glucose, Bld: 317 mg/dL — ABNORMAL HIGH (ref 70–99)
Phosphorus: 7.2 mg/dL — ABNORMAL HIGH (ref 2.5–4.6)
Phosphorus: 6.9 mg/dL — ABNORMAL HIGH (ref 2.5–4.6)
Potassium: 5.4 mmol/L — ABNORMAL HIGH (ref 3.5–5.1)
Potassium: 4.8 mmol/L (ref 3.5–5.1)
Sodium: 138 mmol/L (ref 135–145)
Sodium: 140 mmol/L (ref 135–145)

## 2020-02-01 LAB — POCT I-STAT 7, (LYTES, BLD GAS, ICA,H+H)
Acid-Base Excess: 7 mmol/L — ABNORMAL HIGH (ref 0.0–2.0)
Bicarbonate: 35.5 mmol/L — ABNORMAL HIGH (ref 20.0–28.0)
Calcium, Ion: 1.11 mmol/L — ABNORMAL LOW (ref 1.15–1.40)
HCT: 29 % — ABNORMAL LOW (ref 36.0–46.0)
Hemoglobin: 9.9 g/dL — ABNORMAL LOW (ref 12.0–15.0)
O2 Saturation: 95 %
Potassium: 4.9 mmol/L (ref 3.5–5.1)
Sodium: 136 mmol/L (ref 135–145)
TCO2: 38 mmol/L — ABNORMAL HIGH (ref 22–32)
pCO2 arterial: 73.9 mmHg (ref 32.0–48.0)
pH, Arterial: 7.29 — ABNORMAL LOW (ref 7.350–7.450)
pO2, Arterial: 89 mmHg (ref 83.0–108.0)

## 2020-02-01 LAB — CULTURE, RESPIRATORY W GRAM STAIN

## 2020-02-01 LAB — TRIGLYCERIDES: Triglycerides: 179 mg/dL — ABNORMAL HIGH (ref <150)

## 2020-02-01 LAB — GLUCOSE, CAPILLARY
Glucose-Capillary: 389 mg/dL — ABNORMAL HIGH (ref 70–99)
Glucose-Capillary: 379 mg/dL — ABNORMAL HIGH (ref 70–99)
Glucose-Capillary: 308 mg/dL — ABNORMAL HIGH (ref 70–99)
Glucose-Capillary: 261 mg/dL — ABNORMAL HIGH (ref 70–99)
Glucose-Capillary: 247 mg/dL — ABNORMAL HIGH (ref 70–99)
Glucose-Capillary: 299 mg/dL — ABNORMAL HIGH (ref 70–99)

## 2020-02-01 LAB — MAGNESIUM: Magnesium: 2.8 mg/dL — ABNORMAL HIGH (ref 1.7–2.4)

## 2020-02-01 MED ORDER — INSULIN DETEMIR 100 UNIT/ML ~~LOC~~ SOLN
15.0000 [IU] | Freq: Two times a day (BID) | SUBCUTANEOUS | Status: DC
  Administered 2020-02-01 – 2020-02-02 (×2): 15 [IU] via SUBCUTANEOUS
  Filled 2020-02-01 (×3): qty 0.15

## 2020-02-01 MED ORDER — HEPARIN SODIUM (PORCINE) 1000 UNIT/ML DIALYSIS
1000.0000 [IU] | INTRAMUSCULAR | Status: DC | PRN
  Filled 2020-02-01: qty 6

## 2020-02-01 MED ORDER — PRISMASOL BGK 0/2.5 32-2.5 MEQ/L IV SOLN
INTRAVENOUS | Status: DC
  Filled 2020-02-01 (×6): qty 5000

## 2020-02-01 MED ORDER — PRISMASOL BGK 0/2.5 32-2.5 MEQ/L IV SOLN
INTRAVENOUS | Status: DC
  Filled 2020-02-01 (×19): qty 5000

## 2020-02-01 MED ORDER — HEPARIN SODIUM (PORCINE) 5000 UNIT/ML IJ SOLN
5000.0000 [IU] | Freq: Three times a day (TID) | INTRAMUSCULAR | Status: DC
  Administered 2020-02-02 – 2020-02-03 (×4): 5000 [IU] via SUBCUTANEOUS
  Filled 2020-02-01 (×4): qty 1

## 2020-02-01 MED ORDER — STERILE WATER FOR INJECTION IV SOLN
INTRAVENOUS | Status: DC
  Filled 2020-02-01 (×3): qty 850

## 2020-02-01 MED ORDER — SODIUM CHLORIDE 0.9 % IV SOLN
2.0000 g | Freq: Two times a day (BID) | INTRAVENOUS | Status: DC
  Administered 2020-02-01 – 2020-02-03 (×4): 2 g via INTRAVENOUS
  Filled 2020-02-01 (×4): qty 2

## 2020-02-01 MED ORDER — SODIUM CHLORIDE 0.9 % FOR CRRT: 500.0000 mL | INTRAVENOUS_CENTRAL | Status: DC | PRN

## 2020-02-01 MED ORDER — PRISMASOL BGK 0/2.5 32-2.5 MEQ/L IV SOLN
INTRAVENOUS | Status: DC
  Filled 2020-02-01 (×4): qty 5000

## 2020-02-01 NOTE — Progress Notes (Signed)
PHARMACY NOTE:  ANTIMICROBIAL RENAL DOSAGE ADJUSTMENT  Current antimicrobial regimen includes a mismatch between antimicrobial dosage and estimated renal function.  As per policy approved by the Pharmacy & Therapeutics and Medical Executive Committees, the antimicrobial dosage will be adjusted accordingly.  Current antimicrobial dosage:  Cefepime 2g IV q24h  Indication: PNA  Renal Function: [x]      On CRRT    Antimicrobial dosage has been changed to:  Cefepime 2g IV q12h when CRRT initiated   Arturo Morton, PharmD, BCPS Please check AMION for all [facility] contact numbers Clinical Pharmacist 02/01/2020 2:30 PM

## 2020-02-01 NOTE — Consult Note (Signed)
[name] Admit Date: 01/09/2020 02/01/2020 [name] Requesting Physician:  [name]  Reason for Consult:  AKI HPI:  65 year old female admitted on 4/27 for Covid pneumonia and heart failure exacerbation.  Past medical history includes chronic pain, heart failure, COPD, diabetes, hyperlipidemia, hypertension, obesity, OSA.  History was obtained per chart review as the patient was intubated and sedated.  Prior to this admission the patient had recently been admitted for leg ischemia requiring stent to the left distal SFA.  She was discharged on 4/23.  Patient then represented on 4/27 for worsening shortness of breath and she was found to be Covid positive.  It was also felt she had volume overload and was admitted for diuresis and treatment of her Covid.  Both improved significantly with typical treatment.  She did undergo VQ scan which was negative for pulmonary embolism.  Patient appeared to be getting better but on 5/8 developed worsening hypoxia that progressed despite diuresis.  She was then intubated for refractory hypoxia on 5/12.  She was also started on antibiotics and her diuretics were increased.  She was started on anticoagulation.  She was also given pulse dose steroids.  After intubation she was found to have a small amount of pneumomediastinum.  Her baseline creatinine is around 1.5-2.  Her creatinine was at baseline on 5/12 but have subsequently worsened since that time.  Her urine output is also fallen recently and is nearly anuric.  Today the patient had worsening hypoxia requiring 100% FiO2.  Chest x-rays have demonstrated pulmonary edema.  Patient has required pressors since the time of intubation with norepinephrine.  PMH Incudes: Past Medical History:  Diagnosis Date  . Achilles rupture   . Anxiety   . Arthritis   . Asthma   . Chronic back pain   . Chronic diastolic heart failure ([place]) 11/30/2016  . Chronic headaches   . Chronic leg pain    bilaterally  .  CHRONIC OBSTRUCTIVE PULMONARY DISEASE, MODERATE 03/26/2010   Annotation: with moderate restrictive lung disease as well per PFTs  04/02/07--actual numbers not noted in old records Qualifier: Diagnosis of  By: Amil Amen MD, Benjamine Mola    . Depression   . Diabetes mellitus 1999   T2DM  . Glaucoma   . Gout   . H/O gestational diabetes mellitus, not currently pregnant   . Hyperlipidemia   . Hypertension   . Morbid obesity ([place])   . Obstructive sleep apnea on CPAP 07/02/2009   Qualifier: Diagnosis of  By: Amil Amen MD, Benjamine Mola    . OSA (obstructive sleep apnea)   . Sciatica        Creat (mg/dL)  Date Value  01/22/2013 1.19 (H)   Creatinine, Ser (mg/dL)  Date Value  02/01/2020 5.26 (H)  01/31/2020 4.84 (H)  01/31/2020 4.79 (H)  01/30/2020 4.23 (H)  01/30/2020 3.93 (H)  01/30/2020 3.71 (H)  01/29/2020 2.44 (H)  01/28/2020 1.63 (H)  01/27/2020 1.54 (H)  01/26/2020 1.73 (H)  ] I/Os:  ROS Unable to complete review of systems due to the patient's sedation  PMH  Past Medical History:  Diagnosis Date  . Achilles rupture   . Anxiety   . Arthritis   . Asthma   . Chronic back pain   . Chronic diastolic heart failure ([place]) 11/30/2016  . Chronic headaches   . Chronic leg pain    bilaterally  . CHRONIC OBSTRUCTIVE PULMONARY DISEASE, MODERATE 03/26/2010   Annotation: with moderate restrictive lung disease as well per PFTs  04/02/07--actual numbers  not noted in old records Qualifier: Diagnosis of  By: Amil Amen MD, Benjamine Mola    . Depression   . Diabetes mellitus 1999   T2DM  . Glaucoma   . Gout   . H/O gestational diabetes mellitus, not currently pregnant   . Hyperlipidemia   . Hypertension   . Morbid obesity ([place])   . Obstructive sleep apnea on CPAP 07/02/2009   Qualifier: Diagnosis of  By: Amil Amen MD, Benjamine Mola    . OSA (obstructive sleep apnea)   . Sciatica    PSH  Past Surgical History:  Procedure Laterality Date  . ABDOMINAL AORTOGRAM W/LOWER EXTREMITY Bilateral  01/08/2020   Procedure: ABDOMINAL AORTOGRAM W/LOWER EXTREMITY;  Surgeon: Marty Heck, MD;  Location: [facility];  Service: Cardiovascular;  Laterality: Bilateral;  . BACK SURGERY    . BREAST EXCISIONAL BIOPSY Right   . CERVICAL SPINE SURGERY    . CESAREAN SECTION  12/10/89  . CHOLECYSTECTOMY  1991  . LUMBAR SPINE SURGERY    . PERIPHERAL VASCULAR INTERVENTION Left 01/08/2020   Procedure: PERIPHERAL VASCULAR INTERVENTION;  Surgeon: Marty Heck, MD;  Location: [facility];  Service: Cardiovascular;  Laterality: Left;  . RIGHT HEART CATH N/A 12/06/2016   Procedure: Right Heart Cath;  Surgeon: Larey Dresser, MD;  Location: [facility];  Service: Cardiovascular;  Laterality: N/A;  . TUBAL LIGATION     FH  Family History  Problem Relation Age of Onset  . Heart failure Mother   . Renal Disease Mother    [place]  reports that she quit smoking about 21 years ago. Her smoking use included cigarettes. She has never used smokeless tobacco. She reports current alcohol use. She reports that she does not use drugs. Allergies  Allergies  Allergen Reactions  . Pork Allergy   . Pork-Derived Products Nausea And Vomiting   Home medications Prior to Admission medications   Medication Sig Start Date End Date Taking? Authorizing Provider  acetaminophen (TYLENOL) 500 MG tablet Take 500-1,000 mg by mouth every 6 (six) hours as needed for mild pain or headache.    Yes [provider]  ALPHAGAN P 0.15 % ophthalmic solution Place 1 drop into both eyes 2 (two) times daily.  07/03/19  Yes [provider]  aspirin EC 81 MG tablet Take 81 mg by mouth daily with breakfast.   Yes [provider]  atorvastatin (LIPITOR) 40 MG tablet Take 1 tablet (40 mg total) by mouth daily. 01/09/20 02/08/20 Yes Donne Hazel, MD  Cholecalciferol (VITAMIN D) 50 MCG (2000 UT) tablet Take 2,000 Units by mouth daily.   Yes [provider]  clopidogrel (PLAVIX) 75 MG  tablet Take 1 tablet (75 mg total) by mouth daily. 01/10/20 02/09/20 Yes Donne Hazel, MD  colchicine 0.6 MG tablet Take 0.6 mg by mouth daily.   Yes [provider]  dorzolamide-timolol (COSOPT) 22.3-6.8 MG/ML ophthalmic solution Place 1 drop into both eyes 2 (two) times daily.   Yes [provider]  doxazosin (CARDURA) 4 MG tablet Take 4 mg by mouth at bedtime.   Yes [provider]  insulin aspart (NOVOLOG FLEXPEN) 100 UNIT/ML FlexPen Inject 3-20 Units into the skin 3 (three) times daily as needed for high blood sugar (CBG 150).   Yes [provider]  insulin glargine (LANTUS) 100 unit/mL SOPN Inject 40 Units into the skin daily before breakfast.    Yes [provider]  liraglutide (VICTOZA) 18 MG/3ML SOPN Inject 1.8  mg into the skin daily.    Yes [provider]  lisinopril (PRINIVIL,ZESTRIL) 5 MG tablet Take 5 mg by mouth daily with breakfast.  06/18/17  Yes [provider]  oxyCODONE-acetaminophen (PERCOCET/ROXICET) 5-325 MG tablet Take 1-2 tablets by mouth every 6 (six) hours as needed for moderate pain. 01/09/20  Yes Donne Hazel, MD  prednisoLONE acetate (PRED FORTE) 1 % ophthalmic suspension Place 1 drop into the right eye in the morning and at bedtime. 01/01/20  Yes [provider]  [place] into the lungs at bedtime. CPAP   Yes [provider]  torsemide (DEMADEX) 20 MG tablet Take 3 tablets (60 mg total) by mouth as directed. Take 60 mg in the AM. You may take 60 mg in the PM as needed for swelling Patient taking differently: Take 40 mg by mouth daily.  12/04/17  Yes Josue Hector, MD  Travoprost, BAK Free, (TRAVATAN) 0.004 % SOLN ophthalmic solution Place 1 drop into both eyes at bedtime.   Yes [provider]  valACYclovir (VALTREX) 1000 MG tablet Take 1,000 mg by mouth 3 (three) times daily. 09/29/19  Yes [provider]  albuterol (PROVENTIL HFA;VENTOLIN HFA) 108  (90 BASE) MCG/ACT inhaler Inhale 2 puffs into the lungs every 6 (six) hours as needed for wheezing or shortness of breath.     Robyn Haber, MD  Vitamin D, Ergocalciferol, (DRISDOL) 50000 units CAPS capsule Take 1 capsule (50,000 Units total) by mouth every 7 (seven) days. Patient not taking: Reported on 01/05/2020 09/13/17   Adora Fridge, RPH-CPP    Current Medications Scheduled Meds: . artificial tears  1 application Both Eyes Q7Y  . vitamin C  500 mg Per Tube Daily  . aspirin  81 mg Per Tube Daily  . atorvastatin  40 mg Per Tube Daily  . brimonidine  1 drop Both Eyes BID  . chlorhexidine gluconate (MEDLINE KIT)  15 mL Mouth Rinse BID  . Chlorhexidine Gluconate Cloth  6 each Topical Daily  . clopidogrel  75 mg Per Tube Daily  . docusate  100 mg Per Tube BID  . dorzolamide-timolol  1 drop Both Eyes BID  . enoxaparin (LOVENOX) injection  30 mg Subcutaneous Daily  . feeding supplement (PRO-STAT SUGAR FREE 64)  30 mL Per Tube QID  . insulin aspart  0-20 Units Subcutaneous Q4H  . insulin detemir  12 Units Subcutaneous Daily  . latanoprost  1 drop Both Eyes QHS  . mouth rinse  15 mL Mouth Rinse 10 times per day  . methylPREDNISolone (SOLU-MEDROL) injection  40 mg Intravenous Q12H  . pantoprazole (PROTONIX) IV  40 mg Intravenous Q24H  . polyethylene glycol  17 g Per Tube Daily  . prednisoLONE acetate  1 drop Right Eye BID  . sodium chloride flush  10 mL Intracatheter Q8H  . zinc sulfate  220 mg Per Tube Daily   Continuous Infusions: . sodium chloride Stopped (01/29/20 1107)  . sodium chloride    . ceFEPime (MAXIPIME) IV 2 g (01/31/20 2153)  . feeding supplement (VITAL 1.5 CAL) 1,000 mL (01/31/20 1630)  . fentaNYL infusion INTRAVENOUS 300 mcg/hr (02/01/20 1300)  . midazolam 7 mg/hr (02/01/20 1300)  . norepinephrine (LEVOPHED) Adult infusion 11 mcg/min (02/01/20 1300)  . prismasol BGK 2/2.5 dialysis solution    . prismasol BGK 2/2.5 replacement solution    . prismasol BGK  2/2.5 replacement solution    .  sodium bicarbonate (isotonic) infusion in sterile water 75 mL/hr at 02/01/20  1300   PRN Meds:.Place/Maintain arterial line **AND** sodium chloride, acetaminophen, fentaNYL, heparin, heparin, ipratropium-albuterol, midazolam, [DISCONTINUED] ondansetron **OR** ondansetron (ZOFRAN) IV, sodium chloride, sodium chloride flush, vecuronium  CBC Recent Labs  Lab 01/29/20 0500 01/29/20 0554 01/30/20 0322 01/30/20 0428 01/30/20 2155 01/31/20 0350 01/31/20 0500  WBC 27.2*  --  21.7*  --   --   --  20.6*  NEUTROABS 25.0*  --  20.6*  --   --   --  17.7*  HGB 11.4*   < > 10.2*   < > 10.9* 10.2* 9.9*  HCT 35.1*   < > 34.5*   < > 32.0* 30.0* 32.1*  MCV 104.8*  --  107.8*  --   --   --  104.6*  PLT 106*  --  90*  --   --   --  67*   < > = values in this interval not displayed.   Basic Metabolic Panel Recent Labs  Lab 01/28/20 0403 01/28/20 1902 01/29/20 0500 01/29/20 0554 01/29/20 1143 01/29/20 2030 01/30/20 0322 01/30/20 0428 01/30/20 0037 01/30/20 0488 01/30/20 1704 01/30/20 1713 01/30/20 2155 01/31/20 0350 01/31/20 0500 01/31/20 1653 02/01/20 0557  NA   < >  --  136   < >   < >  --  136   < > 136   < > 138 141 137 137 139 137 138  K   < >  --  4.8   < >   < >  --  5.8*   < > 5.3*   < > 4.8 4.7 5.1 4.8 5.0 5.5* 5.4*  CL   < >  --  98  --   --   --  100  --  102  --   --  103  --   --  101 98 94*  CO2   < >  --  20*  --   --   --  26  --  23  --   --  27  --   --  27 30 27   GLUCOSE   < >  --  162*  --   --   --  466*  --  449*  --   --  90  --   --  176* 341* 406*  BUN   < >  --  48*  --   --   --  66*  --  68*  --   --  72*  --   --  82* 91* 110*  CREATININE   < >  --  2.44*  --   --   --  3.71*  --  3.93*  --   --  4.23*  --   --  4.79* 4.84* 5.26*  CALCIUM   < >  --  8.1*  --   --   --  7.7*  --  7.5*  --   --  7.5*  --   --  7.7* 7.6* 7.8*  PHOS  --  6.6* 6.2*  --   --  7.8* 9.1*  --   --   --   --   --   --   --   --   --  7.2*   < > = values  in this interval not displayed.    Physical Exam  Blood pressure (!) 133/48, pulse 60, temperature 98.1 F (36.7 C), temperature source Axillary, resp. rate 16, height 5' 3"  (1.6 m), weight  108.5 kg, last menstrual period 02/16/2017, SpO2 91 %. GEN: Ill-appearing, lying in bed, ventilated ENT: Scant nasal discharge, moist mucous membranes EYES: no scleral icterus, eyes closed with mild periorbital edema CV: Bradycardia, no obvious murmurs PULM: Bilateral chest rise, ventilated, coarse breath sounds bilaterally ABD: Bowel sounds present, nondistended SKIN: no rashes or jaundice EXT: 1+ pitting edema in the bilateral lower extremities, warm and well perfused   Assessment 64 year old female with chronic pain, CHF, COPD, DM 2, HLD, HTN, obesity, OSA here with Covid pneumonia with worsening hypoxic respiratory failure, shock, renal failure.   Plan 1. Acute renal failure, oliguric AKI: Most likely ATN in the setting of shock with diminishing urine output and subsequent electrolyte derangements.  Also with volume overload and increasing oxygen requirement.  Only acute indication for dialysis is refractory volume overload with difficulties ventilation now on 100% FiO2.  We will plan to start CRRT today to assist with volume removal 1. Start CRRT 1. 2K/2.5 calcium solution for prefilter, post filter, dialysate 2. Will adjust solution as needed 3. 100 cc/h of ultrafiltration, goal net -1 to 2 L today 4. Continue to monitor shock with ultrafiltration 5. Renal function panel twice daily 2. Obtain urinalysis 3. Continue to monitor urine output closely 4. Consider use of IV diuretics to help with ventilatory status 5. Avoid nephrotoxic agents if possible 6. Could consider renal ultrasound once more stable 7. Repeat arterial blood gas and consider stopping sodium bicarb based on results given we are starting CRRT 2. Hypoxic respiratory failure: Associated with Covid pneumonia possibly bacterial  superinfection now with volume overload and CHF contributing 1. CRRT for ultrafiltration as above 2. Consider adding back IV diuretics if needed 3. Consider decreasing pressors to diminish afterload given hypertension today 4. Ventilation per primary team 3. Hyperkalemia: Mild but persistent for several days.  Level 5.3 today likely will improve with CRRT.  Consider switching to 4K bath once potassium is under 4. 4. Hyperphosphatemia: Level 7.2 today.  Associated with kidney injury.  Likely will improve with CRRT 5. Covid infection: Status post topical treatments with some improvement now with worsening respiratory status.  Primary team managing. 6. Hypoalbuminemia: Likely associated with acute illness and malnutrition.  Could make volume removal difficult. 7. Shock: Multifactorial with medication induced (propofol) and sepsis contributing.  On norepinephrine.  Blood pressure much better today.  Primary team can wean pressors as needed.  On cefepime and vancomycin.  Will need to adjust dosing of medications based on CRRT. 8. Respiratory acidosis: pH yesterday 7.25 with elevated PCO2.  Only partial metabolic compensation likely associated with kidney injury.  Repeat ABG and consider stopping IV bicarbonate based on results.  Starting CRRT as above 9. Diabetes mellitus type 2: With hyperglycemia, uncontrolled.  Primary team to manage. 10. Hyperlipidemia: Atorvastatin 40 mg daily, aspirin 81 mg daily.  Plans communicated to the primary team   [name]  [phone] pgr 02/01/2020, 2:23 PM

## 2020-02-01 NOTE — Progress Notes (Signed)
While bathing patient it was noted that she was bleeding from her rectum.  MD notified and patient is bleeding from hemorrhoids.  No new orders received at this time.  Bath was finished and will continue to monitor patient.

## 2020-02-01 NOTE — Progress Notes (Signed)
NAME:  [name], MRN:  [id number], DOB:  [birth year], LOS: 35 ADMISSION DATE:  12/20/2019, CONSULTATION DATE:  5/11 REFERRING MD:  Dr Candiss Norse, CHIEF COMPLAINT:  Hypoxia   Brief History   64 year old female admitted 4/26 for COVID pneumonia and CHF. Initially improved with the usual treatment. Was on room air. 5/8 developed worsening hypoxia that was progressive despite diuresis. As of 5/11 required NRB and HFNC at 35LPM and became lethargic. PCCM consulted.     Past Medical History   has a past medical history of Achilles rupture, Anxiety, Arthritis, Asthma, Chronic back pain, Chronic diastolic heart failure ([place]) (11/30/2016), Chronic headaches, Chronic leg pain, CHRONIC OBSTRUCTIVE PULMONARY DISEASE, MODERATE (03/26/2010), Depression, Diabetes mellitus (1999), Glaucoma, Gout, H/O gestational diabetes mellitus, not currently pregnant, Hyperlipidemia, Hypertension, Morbid obesity ([place]), Obstructive sleep apnea on CPAP (07/02/2009), OSA (obstructive sleep apnea), and Sciatica.   Significant Hospital Events   4/18-4/23>> admit to [facility]-s/p stent to left distal SFA 4/27>> admit to [facility] for hypoxia from Covid/heart failure 4/27>> lower extremity Dopplers negative for DVT. Q000111Q grade 2 diastolic dysfunction, moderate RV systolic dysfunction.  RVSP 62.6 mmHg. 4/30>> VQ scan-low probability PE 5/5>> improved-on room air 5/8>> worsening hypoxemia-on 15 L of oxygen-with dramatic response to Lasix-down to 4 L 5/10 -5/12 once again requiring oxygen. Had been down to room air prior to this. Oxygen requirements progressed to point where she required intubation. 5/10 weight was up significantly. 106.4 to 109.7 Kg. 5/12 intubated. ABX started. Lasix escalated. LE [place] repeated and was placed on therapeutic LMWH. Post intubation film w/ small pneumomediastinum. Tube feeds started. Pulse steroids started Consults:    Procedures:  5/12 ETT >> RUE PICC 5/12>> BL chest tubes 5/13 >>  Significant Diagnostic  Tests:  Lower extremity doppler 4/27 > no evidence of DVT Echo 4/29 > Grade II DD, moderate reduction of RV systolic function.  V/Q 4/30 > No evidence PE US renal 4/30 > normal size kidneys, no hydro.   Micro Data:  COVID pos 4/27  Flu neg 4/27  Blood cx neg 4/28 > 5/12 BC >> neg 5/12 resp >> GPC pairs >> nml flora  Antimicrobials:  Cefepime 5/12 > Vancomycin  5/12 >zyvox (changed due to cr)>>> 5/12 >> 514  Interim history/subjective:  Critically ill requiring higher FiO2 needs.  Utilize neuromuscular blockade today.  Hopefully CRRT will pull fluid off of her and improve her pulmonary status.   Objective   Blood pressure (!) 133/48, pulse 60, temperature 98.1 F (36.7 C), temperature source Axillary, resp. rate 16, height 5\' 3"  (1.6 m), weight 108.5 kg, last menstrual period 02/16/2062, SpO2 91 %. CVP:  [11 mmHg-87 mmHg] 13 mmHg  Vent Mode: PRVC FiO2 (%):  [70 %-100 %] 100 % Set Rate:  [35 bmp] 35 bmp Vt Set:  [380 mL] 380 mL PEEP:  [14 cmH20] 14 cmH20 Plateau Pressure:  [39 cmH20-41 cmH20] 40 cmH20   Intake/Output Summary (Last 24 hours) at 02/01/2020 1444 Last data filed at 02/01/2020 1300 Gross per 24 hour  Intake 4109.34 ml  Output 1174 ml  Net 2935.34 ml   Filed Weights   01/28/20 0500 01/30/20 0322 01/31/20 0500  Weight: 99.5 kg 104.4 kg 108.5 kg    Examination:  General: Morbid obese female heavily sedated HEENT: Endotracheal tube gastric tube in place Neuro: Currently receiving neuromuscular blockade CV: Heart sounds are distant PULM: Breath sounds are distant and synchronous with the vent normal chest wall paradoxus noted Vent pressure regulated volume control with ARDS  protocol FIO2 100% PEEP 16 RATE 35 VT 6 cc/kg Bilateral chest tubes in place without air leak GI: soft, bsx4 active  GU: Oliguric Extremities: warm/dry,  edema  Skin: no rashes or lesions     Worsening creatinine   Resolved Hospital Problem list     Assessment & Plan:    ARDS due to COVID 19 pneumonia: positive test 4/27. S/p Remdesivir, decadron, actemra .  Interestingly oxygen requirements initially improved and then worsened>> not typical course for Covid pneumonia but no other cause identified Acute on chronic HFpEF  COPD +/- acute exacerbation OSA: non-complaint with CPAP  Plan Continue ARDS protocol Neuromuscular blockade Bilateral chest tubes Morbidly obese   Pneumomediastinum    Chest tube in place with no air leak Currently on 16 of PEEP and try to decrease Neuromuscular blockade better controlled ventilatory status  Shock, likely related to sedation versus sepsis not present on admission Bradycardia -2/2 propofol Plan Pressors as needed   AKI Lab Results  Component Value Date   CREATININE 5.26 (H) 02/01/2020   CREATININE 4.84 (H) 01/31/2020   CREATININE 4.79 (H) 01/31/2020   CREATININE 1.19 (H) 01/22/2013    CKD stage 3b  baseline cr ~1.5 to 2.5 even dating back 2 yrs ago. Worsening BUN/Cr, decreasing UOP; HD cath placed 5/14, will consult nephrology for assistance today ?dialysis for worsening uremia, lytes, fluid balance in setting of acute on chronic CHF; no interstitial/extremity edema... -treat hyperkalemia as needed -   Plan Start CRRT Volume reduction per nephrology   Uncontrolled DM w/ labile glycemic control (drops in am) CBG (last 3)  Recent Labs    02/01/20 0311 02/01/20 0800 02/01/20 1145  GLUCAP 389* 379* 308*    Plan Sliding scale insulin protocol Adjustments as needed    Elevated D Dimer: likely due to [place]. VTE essentially ruled out dopplers and VQ scan completed on 4/30 Plan Currently on Lovenox  PAD: s/p recent stent to LLE Plan Aspirin Plavix  Thrombocytopenia: Platelets 67 continue to monitor Monitor daily Observe for any bleeding No need for transfusion at this time    Summary  -requiring higher FiO2 and PEEP.  As needed neuromuscular blockade.  Continue to  evaluate   Best practice:  Diet: start TF Pain/Anxiety/Delirium protocol (if indicated): Propofol/fentanyl, goal RASS -5 VAP protocol (if indicated): Yes DVT prophylaxis: Lovenox GI prophylaxis: PPI Glucose control: SSI Mobility: BR Code Status: FULL Family Communication: Attempt update daughter daily Disposition: ICU    App cct 30 min Richardson Landry Navie Lamoreaux ACNP Acute Care Nurse Practitioner [place] Please consult Amion 02/01/2020, 2:45 PM

## 2020-02-02 ENCOUNTER — Inpatient Hospital Stay (HOSPITAL_COMMUNITY): Payer: BC Managed Care – PPO

## 2020-02-02 LAB — URINALYSIS, ROUTINE W REFLEX MICROSCOPIC
Bilirubin Urine: NEGATIVE
Glucose, UA: 50 mg/dL — AB
Ketones, ur: 5 mg/dL — AB
Nitrite: NEGATIVE
Protein, ur: 100 mg/dL — AB
RBC / HPF: >50 RBC/hpf — ABNORMAL HIGH (ref 0–5)
Specific Gravity, Urine: 1.027 (ref 1.005–1.030)
pH: 5 (ref 5.0–8.0)

## 2020-02-02 LAB — RENAL FUNCTION PANEL
Albumin: 2.3 g/dL — ABNORMAL LOW (ref 3.5–5.0)
Anion gap: 8 (ref 5–15)
BUN: 51 mg/dL — ABNORMAL HIGH (ref 8–23)
CO2: 28 mmol/L (ref 22–32)
Calcium: 8 mg/dL — ABNORMAL LOW (ref 8.9–10.3)
Chloride: 101 mmol/L (ref 98–111)
Creatinine, Ser: 2.35 mg/dL — ABNORMAL HIGH (ref 0.44–1.00)
GFR calc Af Amer: 25 mL/min — ABNORMAL LOW (ref >60)
GFR calc non Af Amer: 21 mL/min — ABNORMAL LOW (ref >60)
Glucose, Bld: 215 mg/dL — ABNORMAL HIGH (ref 70–99)
Phosphorus: 5 mg/dL — ABNORMAL HIGH (ref 2.5–4.6)
Potassium: 4.5 mmol/L (ref 3.5–5.1)
Sodium: 137 mmol/L (ref 135–145)

## 2020-02-02 LAB — POCT I-STAT 7, (LYTES, BLD GAS, ICA,H+H)
Acid-Base Excess: 4 mmol/L — ABNORMAL HIGH (ref 0.0–2.0)
Acid-Base Excess: 1 mmol/L (ref 0.0–2.0)
Acid-Base Excess: 1 mmol/L (ref 0.0–2.0)
Bicarbonate: 32.8 mmol/L — ABNORMAL HIGH (ref 20.0–28.0)
Bicarbonate: 31.2 mmol/L — ABNORMAL HIGH (ref 20.0–28.0)
Bicarbonate: 30.6 mmol/L — ABNORMAL HIGH (ref 20.0–28.0)
Calcium, Ion: 1.16 mmol/L (ref 1.15–1.40)
Calcium, Ion: 1.19 mmol/L (ref 1.15–1.40)
Calcium, Ion: 1.22 mmol/L (ref 1.15–1.40)
HCT: 31 % — ABNORMAL LOW (ref 36.0–46.0)
HCT: 46 % (ref 36.0–46.0)
HCT: 31 % — ABNORMAL LOW (ref 36.0–46.0)
Hemoglobin: 10.5 g/dL — ABNORMAL LOW (ref 12.0–15.0)
Hemoglobin: 15.6 g/dL — ABNORMAL HIGH (ref 12.0–15.0)
Hemoglobin: 10.5 g/dL — ABNORMAL LOW (ref 12.0–15.0)
O2 Saturation: 95 %
O2 Saturation: 81 %
O2 Saturation: 94 %
Patient temperature: 97.5
Potassium: 4.9 mmol/L (ref 3.5–5.1)
Potassium: 4.7 mmol/L (ref 3.5–5.1)
Potassium: 4.6 mmol/L (ref 3.5–5.1)
Sodium: 134 mmol/L — ABNORMAL LOW (ref 135–145)
Sodium: 135 mmol/L (ref 135–145)
Sodium: 134 mmol/L — ABNORMAL LOW (ref 135–145)
TCO2: 35 mmol/L — ABNORMAL HIGH (ref 22–32)
TCO2: 34 mmol/L — ABNORMAL HIGH (ref 22–32)
TCO2: 33 mmol/L — ABNORMAL HIGH (ref 22–32)
pCO2 arterial: 69.6 mmHg (ref 32.0–48.0)
pCO2 arterial: 76.2 mmHg (ref 32.0–48.0)
pCO2 arterial: 75.8 mmHg (ref 32.0–48.0)
pH, Arterial: 7.279 — ABNORMAL LOW (ref 7.350–7.450)
pH, Arterial: 7.22 — ABNORMAL LOW (ref 7.350–7.450)
pH, Arterial: 7.215 — ABNORMAL LOW (ref 7.350–7.450)
pO2, Arterial: 83 mmHg (ref 83.0–108.0)
pO2, Arterial: 56 mmHg — ABNORMAL LOW (ref 83.0–108.0)
pO2, Arterial: 91 mmHg (ref 83.0–108.0)

## 2020-02-02 LAB — CULTURE, BLOOD (ROUTINE X 2)
Culture: NO GROWTH
Culture: NO GROWTH

## 2020-02-02 LAB — CBC
HCT: 32.3 % — ABNORMAL LOW (ref 36.0–46.0)
Hemoglobin: 10 g/dL — ABNORMAL LOW (ref 12.0–15.0)
MCH: 32.7 pg (ref 26.0–34.0)
MCHC: 31 g/dL (ref 30.0–36.0)
MCV: 105.6 fL — ABNORMAL HIGH (ref 80.0–100.0)
Platelets: 59 10*3/uL — ABNORMAL LOW (ref 150–400)
RBC: 3.06 MIL/uL — ABNORMAL LOW (ref 3.87–5.11)
RDW: 23.4 % — ABNORMAL HIGH (ref 11.5–15.5)
WBC: 19.5 10*3/uL — ABNORMAL HIGH (ref 4.0–10.5)
nRBC: 1.9 % — ABNORMAL HIGH (ref 0.0–0.2)

## 2020-02-02 LAB — GLUCOSE, CAPILLARY
Glucose-Capillary: 254 mg/dL — ABNORMAL HIGH (ref 70–99)
Glucose-Capillary: 293 mg/dL — ABNORMAL HIGH (ref 70–99)
Glucose-Capillary: 199 mg/dL — ABNORMAL HIGH (ref 70–99)
Glucose-Capillary: 196 mg/dL — ABNORMAL HIGH (ref 70–99)
Glucose-Capillary: 170 mg/dL — ABNORMAL HIGH (ref 70–99)
Glucose-Capillary: 213 mg/dL — ABNORMAL HIGH (ref 70–99)

## 2020-02-02 LAB — MAGNESIUM: Magnesium: 2.6 mg/dL — ABNORMAL HIGH (ref 1.7–2.4)

## 2020-02-02 MED ORDER — FENTANYL 2500MCG IN NS 250ML (10MCG/ML) PREMIX INFUSION
50.0000 ug/h | INTRAVENOUS | Status: DC
  Administered 2020-02-02 – 2020-02-03 (×5): 300 ug/h via INTRAVENOUS
  Administered 2020-02-04: 200 ug/h via INTRAVENOUS
  Administered 2020-02-05: 150 ug/h via INTRAVENOUS
  Administered 2020-02-06: 125 ug/h via INTRAVENOUS
  Filled 2020-02-02 (×9): qty 250

## 2020-02-02 MED ORDER — INSULIN DETEMIR 100 UNIT/ML ~~LOC~~ SOLN
20.0000 [IU] | Freq: Two times a day (BID) | SUBCUTANEOUS | Status: DC
  Administered 2020-02-02 – 2020-02-05 (×6): 20 [IU] via SUBCUTANEOUS
  Filled 2020-02-02 (×7): qty 0.2

## 2020-02-02 MED ORDER — STERILE WATER FOR INJECTION IJ SOLN
10.0000 mL | INTRAMUSCULAR | Status: DC | PRN
  Administered 2020-02-02: 10 mL via INTRAMUSCULAR

## 2020-02-02 MED ORDER — FENTANYL BOLUS VIA INFUSION
50.0000 ug | INTRAVENOUS | Status: DC | PRN
  Administered 2020-02-02 (×2): 50 ug via INTRAVENOUS
  Filled 2020-02-02: qty 50

## 2020-02-02 MED ORDER — FENTANYL CITRATE (PF) 100 MCG/2ML IJ SOLN: 50.0000 ug | Freq: Once | INTRAMUSCULAR | Status: DC

## 2020-02-02 MED ORDER — MIDAZOLAM 50MG/50ML (1MG/ML) PREMIX INFUSION
2.0000 mg/h | INTRAVENOUS | Status: DC
  Administered 2020-02-02 (×3): 8 mg/h via INTRAVENOUS
  Administered 2020-02-03: 9 mg/h via INTRAVENOUS
  Administered 2020-02-03 (×2): 8 mg/h via INTRAVENOUS
  Administered 2020-02-04: 6 mg/h via INTRAVENOUS
  Administered 2020-02-04: 4 mg/h via INTRAVENOUS
  Administered 2020-02-05: 3 mg/h via INTRAVENOUS
  Filled 2020-02-02 (×11): qty 50

## 2020-02-02 MED ORDER — METHYLPREDNISOLONE SODIUM SUCC 40 MG IJ SOLR
40.0000 mg | Freq: Every day | INTRAMUSCULAR | Status: AC
  Administered 2020-02-03 – 2020-02-04 (×2): 40 mg via INTRAVENOUS
  Filled 2020-02-02 (×2): qty 1

## 2020-02-02 MED ORDER — STERILE WATER FOR INJECTION IJ SOLN
INTRAMUSCULAR | Status: AC
  Filled 2020-02-02: qty 10

## 2020-02-02 MED ORDER — MIDAZOLAM BOLUS VIA INFUSION
1.0000 mg | INTRAVENOUS | Status: DC | PRN
  Administered 2020-02-05: 1 mg via INTRAVENOUS
  Filled 2020-02-02: qty 2

## 2020-02-02 MED ORDER — SODIUM CHLORIDE 0.9 % IV SOLN
0.0000 ug/kg/min | INTRAVENOUS | Status: DC
  Administered 2020-02-02 (×2): 3 ug/kg/min via INTRAVENOUS
  Administered 2020-02-03 – 2020-02-06 (×4): 2 ug/kg/min via INTRAVENOUS
  Filled 2020-02-02 (×8): qty 20

## 2020-02-02 MED ORDER — NOREPINEPHRINE 4 MG/250ML-% IV SOLN: 0.0000 ug/min | INTRAVENOUS | Status: DC

## 2020-02-02 MED ORDER — CISATRACURIUM BOLUS VIA INFUSION
5.0000 mg | Freq: Once | INTRAVENOUS | Status: AC
  Administered 2020-02-02: 5 mg via INTRAVENOUS
  Filled 2020-02-02: qty 5

## 2020-02-02 MED ORDER — NOREPINEPHRINE 16 MG/250ML-% IV SOLN
0.0000 ug/min | INTRAVENOUS | Status: DC
  Administered 2020-02-02 – 2020-02-03 (×2): 18 ug/min via INTRAVENOUS
  Administered 2020-02-06 (×3): 70 ug/min via INTRAVENOUS
  Filled 2020-02-02 (×5): qty 250

## 2020-02-02 MED ORDER — INSULIN ASPART 100 UNIT/ML ~~LOC~~ SOLN
6.0000 [IU] | SUBCUTANEOUS | Status: DC
  Administered 2020-02-02 – 2020-02-05 (×19): 6 [IU] via SUBCUTANEOUS

## 2020-02-02 MED ORDER — ARTIFICIAL TEARS OPHTHALMIC OINT
1.0000 "application " | TOPICAL_OINTMENT | Freq: Three times a day (TID) | OPHTHALMIC | Status: DC
  Administered 2020-02-02 – 2020-02-06 (×11): 1 via OPHTHALMIC

## 2020-02-02 NOTE — Progress Notes (Signed)
[facility] Progress Note   64 year old female admitted on 4/27 for Covid pneumonia and heart failure exacerbation w/ h/o  chronic pain, HF,  COPD, diabetes, hyperlipidemia, hypertension, obesity, OSA, PAD.  VQ scan which was negative for pulmonary embolism.  Patient appeared to be getting better but on 5/8 developed worsening hypoxia that progressed despite diuresis.  She was then intubated for refractory hypoxia on 5/12.   Her baseline creatinine is around 1.5-2.   Assessment/ Plan:   1. Acute renal failure, oliguric AKI: Most likely ATN in the setting of shock with diminishing urine output and subsequent electrolyte derangements.  Also with volume overload and increasing oxygen requirement.  Only acute indication for dialysis is refractory volume overload with difficulties ventilation now on 100% FiO2.   CRRT initiated 5/16 (no issues w/ filter clotting)  1. Continue CRRT (5/16- ...) 1. Continue 2K/2.5 calcium solution for prefilter, post filter, dialysate 2. Will adjust solution as needed; still needs 2K at this time. 3. 100-150 cc/h of ultrafiltration -> tolerating 2. Urinalysis not active -> likely ATN 3. Anuric. 4. Avoid nephrotoxic agents if possible 5. Could consider renal ultrasound once more stable -> obstruction lower on differential. 2. Hypoxic respiratory failure: Associated with Covid pneumonia possibly bacterial superinfection now with volume overload and CHF contributing 1. CRRT for ultrafiltration as above 2. Ventilation per primary team 3. Hyperkalemia: Mild but persistent for several days.  Continue 2K for now; will switch  to 4K bath once potassium improves 4. Hyperphosphatemia: Level 7.2   Associated with kidney injury.  Likely will improve with CRRT 5. Covid infection: Status post topical treatments with some improvement now with worsening respiratory status.  Primary team managing. 6. Hypoalbuminemia: Likely associated with acute illness and malnutrition.   Could make volume removal difficult. 7. Shock: Multifactorial with medication induced (propofol) and sepsis contributing.  On norepinephrine.  Blood pressure much better today.  Primary team can wean pressors as needed.  On cefepime and vancomycin.  Will need to adjust dosing of medications based on CRRT. 8. Respiratory acidosis: Only partial metabolic compensation likely associated with kidney injury.  Repeat ABG and consider stopping IV bicarbonate based on results.   9. Diabetes mellitus type 2: With hyperglycemia, uncontrolled.  Primary team to manage. 10. Hyperlipidemia: Atorvastatin 40 mg daily, aspirin 81 mg daily.  Subjective:   Remains  Anuric with stable BP; still on 100% FIO2 and PEEP 17.    Objective:   BP (!) 151/45   Pulse 74   Temp 97.6 F (36.4 C) (Axillary)   Resp (!) 23   Ht 5' 3"  (1.6 m)   Wt 113.8 kg   LMP 02/16/2017 (Within Weeks) Comment: tubal ligation  SpO2 96%   BMI 44.44 kg/m   Intake/Output Summary (Last 24 hours) at 02/02/2020 1100 Last data filed at 02/02/2020 1025 Gross per 24 hour  Intake 4451.19 ml  Output 6981 ml  Net -2529.81 ml   Weight change:   Physical Exam: GEN: Ill-appearing, lying in bed, ventilated EYES: no scleral icterus, eyes closed with mild periorbital edema CV: RRR, no obvious murmurs PULM: Bilateral chest rise, ventilated, coarse breath sounds bilaterally ABD: Bowel sounds present, nondistended SKIN: no rashes or jaundice EXT: 1+ pitting edema in the bilateral lower extremities, warm and well perfused ACCESS: RIJ temp  Imaging: DG Chest Port 1 View  Result Date: 02/02/2020 CLINICAL DATA:  Acute respiratory distress syndrome due to COVID-19 EXAM: PORTABLE CHEST 1 VIEW COMPARISON:  Yesterday FINDINGS: Bilateral chest tube in place.  Two right-sided central lines in good position with tips at the SVC. The feeding tube at least reaches the stomach. Endotracheal tube tip is just below the clavicular heads. Generalized interstitial  and airspace opacity. Cardiomegaly which may be related to rotation. Persistent lucency along the right heart border. IMPRESSION: 1. Stable hardware positioning. 2. Small right anterior pneumothorax versus pneumomediastinum. Electronically Signed   By: Monte Fantasia M.D.   On: 02/02/2020 07:25   DG CHEST PORT 1 VIEW  Result Date: 02/01/2020 CLINICAL DATA:  COVID positive, pneumonia, endotracheal tube. EXAM: PORTABLE CHEST 1 VIEW COMPARISON:  Chest x-rays dated 01/31/2020 and 01/30/2020. FINDINGS: Endotracheal tube remains well positioned with tip above the level of the carina. Enteric tube passes below the diaphragm. RIGHT IJ line sheath is stable in position with tip at the level of the upper SVC. Bilateral chest tubes are stable in position. Persistent bilateral airspace opacities not significantly changed compared to the recent exams. No pneumothorax is seen. IMPRESSION: 1. Persistent bilateral airspace opacities, not significantly changed compared to the recent exams, compatible with multifocal pneumonia and/or pulmonary edema. 2. Lines and tubes appear stable in position. Electronically Signed   By: Franki Cabot M.D.   On: 02/01/2020 07:29    Labs: BMET Recent Labs  Lab 01/28/20 0403 01/28/20 1902 01/29/20 0500 01/29/20 0500 01/29/20 0554 01/29/20 2030 01/30/20 0322 01/30/20 0428 01/30/20 0272 01/30/20 1704 01/30/20 1713 01/30/20 2155 01/31/20 0500 01/31/20 1653 02/01/20 0557 02/01/20 1500 02/01/20 1730 02/02/20 0415 02/02/20 0420  NA   < >  --  136   < >   < >  --  136   < > 136   < > 141   < > 139 137 138 136 140 137 134*  K   < >  --  4.8   < >   < >  --  5.8*   < > 5.3*   < > 4.7   < > 5.0 5.5* 5.4* 4.9 4.8 5.1 4.9  CL   < >  --  98   < >  --   --  100   < > 102  --  103  --  101 98 94*  --  95* 99  --   CO2   < >  --  20*   < >  --   --  26   < > 23  --  27  --  27 30 27   --  29 29  --   GLUCOSE   < >  --  162*   < >  --   --  466*   < > 449*  --  90  --  176* 341* 406*   --  317* 307*  --   BUN   < >  --  48*   < >  --   --  66*   < > 68*  --  72*  --  82* 91* 110*  --  111* 75*  --   CREATININE   < >  --  2.44*   < >  --   --  3.71*   < > 3.93*  --  4.23*  --  4.79* 4.84* 5.26*  --  5.02* 3.34*  --   CALCIUM   < >  --  8.1*   < >  --   --  7.7*   < > 7.5*  --  7.5*  --  7.7* 7.6* 7.8*  --  7.8* 8.1*  --   PHOS  --  6.6* 6.2*  --   --  7.8* 9.1*  --   --   --   --   --   --   --  7.2*  --  6.9* 5.8*  --    < > = values in this interval not displayed.   CBC Recent Labs  Lab 01/28/20 0403 01/28/20 0403 01/29/20 0500 01/29/20 0554 01/30/20 0322 01/30/20 0428 01/31/20 0500 02/01/20 1500 02/02/20 0415 02/02/20 0420  WBC 13.4*   < > 27.2*  --  21.7*  --  20.6*  --  19.5*  --   NEUTROABS 12.2*  --  25.0*  --  20.6*  --  17.7*  --   --   --   HGB 10.8*   < > 11.4*   < > 10.2*   < > 9.9* 9.9* 10.0* 10.5*  HCT 34.5*   < > 35.1*   < > 34.5*   < > 32.1* 29.0* 32.3* 31.0*  MCV 101.2*   < > 104.8*  --  107.8*  --  104.6*  --  105.6*  --   PLT 121*   < > 106*  --  90*  --  67*  --  59*  --    < > = values in this interval not displayed.    Medications:    . artificial tears  1 application Both Eyes D1S  . vitamin C  500 mg Per Tube Daily  . aspirin  81 mg Per Tube Daily  . atorvastatin  40 mg Per Tube Daily  . brimonidine  1 drop Both Eyes BID  . chlorhexidine gluconate (MEDLINE KIT)  15 mL Mouth Rinse BID  . Chlorhexidine Gluconate Cloth  6 each Topical Daily  . cisatracurium  5 mg Intravenous Once  . clopidogrel  75 mg Per Tube Daily  . dorzolamide-timolol  1 drop Both Eyes BID  . feeding supplement (PRO-STAT SUGAR FREE 64)  30 mL Per Tube QID  . fentaNYL (SUBLIMAZE) injection  50 mcg Intravenous Once  . heparin injection (subcutaneous)  5,000 Units Subcutaneous Q8H  . insulin aspart  0-20 Units Subcutaneous Q4H  . insulin aspart  6 Units Subcutaneous Q4H  . insulin detemir  20 Units Subcutaneous BID  . latanoprost  1 drop Both Eyes QHS  . mouth  rinse  15 mL Mouth Rinse 10 times per day  . [START ON 02/03/2020] methylPREDNISolone (SOLU-MEDROL) injection  40 mg Intravenous Daily  . pantoprazole (PROTONIX) IV  40 mg Intravenous Q24H  . prednisoLONE acetate  1 drop Right Eye BID  . sodium chloride flush  10 mL Intracatheter Q8H  . sterile water (preservative free)      . zinc sulfate  220 mg Per Tube Daily      Otelia Santee, MD 02/02/2020, 11:00 AM

## 2020-02-02 NOTE — Progress Notes (Signed)
NAME:  [name], MRN:  [id number], DOB:  1956/03/25, LOS: 86 ADMISSION DATE:  01/10/2020, CONSULTATION DATE:  5/11 REFERRING MD:  Dr Candiss Norse, CHIEF COMPLAINT:  Hypoxia   Brief History   64 year old female admitted 4/26 for COVID pneumonia and CHF. Initially improved with the usual treatment. Was on room air. 5/8 developed worsening hypoxia that was progressive despite diuresis. As of 5/11 required NRB and HFNC at 35LPM and became lethargic. PCCM consulted.    Past Medical History   has a past medical history of Achilles rupture, Anxiety, Arthritis, Asthma, Chronic back pain, Chronic diastolic heart failure ([place]) (11/30/2016), Chronic headaches, Chronic leg pain, CHRONIC OBSTRUCTIVE PULMONARY DISEASE, MODERATE (03/26/2010), Depression, Diabetes mellitus (1999), Glaucoma, Gout, H/O gestational diabetes mellitus, not currently pregnant, Hyperlipidemia, Hypertension, Morbid obesity ([place]), Obstructive sleep apnea on CPAP (07/02/2009), OSA (obstructive sleep apnea), and Sciatica.   Significant Hospital Events   4/18-4/23>> admit to [facility]-s/p stent to left distal SFA 4/27>> admit to [facility] for hypoxia from Covid/heart failure 4/27>> lower extremity Dopplers negative for DVT. Q000111Q grade 2 diastolic dysfunction, moderate RV systolic dysfunction.  RVSP 62.6 mmHg. 4/30>> VQ scan-low probability PE 5/5>> improved-on room air 5/8>> worsening hypoxemia-on 15 L of oxygen-with dramatic response to Lasix-down to 4 L 5/10 -5/12 once again requiring oxygen. Had been down to room air prior to this. Oxygen requirements progressed to point where she required intubation. 5/10 weight was up significantly. 106.4 to 109.7 Kg. 5/12 intubated. ABX started. Lasix escalated. LE [place] repeated and was placed on therapeutic LMWH. Post intubation film w/ small pneumomediastinum. Tube feeds started. Pulse steroids started  Consults:    Procedures:  5/12 ETT >> RUE PICC 5/12>> BL chest tubes 5/13 >>  Significant Diagnostic  Tests:  Lower extremity doppler 4/27 > no evidence of DVT Echo 4/29 > Grade II DD, moderate reduction of RV systolic function.  V/Q 4/30 > No evidence PE US renal 4/30 > normal size kidneys, no hydro.   Micro Data:  COVID pos 4/27  Flu neg 4/27  Blood cx neg 4/28 > 5/12 BC >> neg 5/12 resp >> GPC pairs >> nml flora 5/14 respiratory >> few Cand[place]  Antimicrobials:  Cefepime 5/12 > Vancomycin  5/12 >zyvox (changed due to cr)>>> 5/12 >> 514  Interim history/subjective:   Paralytics added on 5/16 Remains on CVVHD per cc urine output last 24 hours.  I/O+ 8.9 L total Mains on norepinephrine 17 Fentanyl 300, Versed 7 FiO2 1.00, PEEP 16 >> PaO2 83 CVP 10 Bicarbonate infusion running  Objective   Blood pressure (!) 133/48, pulse 70, temperature (!) 97.5 F (36.4 C), temperature source Oral, resp. rate 20, height 5\' 3"  (1.6 m), weight 113.8 kg, last menstrual period 02/16/2017, SpO2 95 %. CVP:  [9 mmHg-87 mmHg] 10 mmHg  Vent Mode: PRVC FiO2 (%):  [70 %-100 %] 100 % Set Rate:  [35 bmp] 35 bmp Vt Set:  [380 mL] 380 mL PEEP:  [14 cmH20-16 cmH20] 16 cmH20 Plateau Pressure:  [40 cmH20-48 cmH20] 44 cmH20   Intake/Output Summary (Last 24 hours) at 02/02/2020 0746 Last data filed at 02/02/2020 0700 Gross per 24 hour  Intake 4569.49 ml  Output 6139 ml  Net -1569.51 ml   Filed Weights   01/30/20 0322 01/31/20 0500 02/02/20 0500  Weight: 104.4 kg 108.5 kg 113.8 kg    Examination:  General: Obese woman, intubated, sedated HEENT: ET tube in good position Neuro: Heavily sedated, paralyzed CV: Distant, no murmur PULM: Coarse bilaterally, decreased at  both bases, no wheezing Bilateral chest tubes in place without air leak GI: Obese, nondistended, positive bowel sounds GU: Oliguric Extremities: No edema, warm Skin: No rash  Chest x-ray 5/17 reviewed by me, shows endotracheal tube in good position, bilateral interstitial edema, opacities with cardiomegaly, rotated.  Persistent right  heart border lucency, possible anterior pneumothorax  Resolved Hospital Problem list     Assessment & Plan:   ARDS due to COVID 19 pneumonia: positive test 4/27. S/p Remdesivir, decadron, actemra .  Interestingly oxygen requirements initially improved and then worsened>> not typical course for Covid pneumonia but no other cause identified other than progressive CHF Acute on chronic HFpEF  COPD +/- acute exacerbation OSA: non-complaint with CPAP Plan Continue ARDS protocol, low tidal volume ventilation strategy Volume removal as she can tolerate Intermittent neuromuscular blockade ordered, start scheduled vecuronium 5/17 Wean PEEP and FiO2 as able.  Goal decrease PEEP given pneumomediastinum, pneumothoraces Remains on corticosteroids empiric for possible acute exacerbation COPD, plan to wean on 5/17 Plan to try to DC bicarbonate infusion 5/17, follow ABG   Pneumomediastinum with bilateral pneumothoraces Bilateral chest tubes in place, to suction and without air leak Wean PEEP as above  Multifactorial shock, likely related to sedation versus sepsis not present on admission Bradycardia -2/2 propofol Plan Goal CVP 10, currently at goal Wean norepinephrine as able.  Continue vasopressin shock dosing Remains on empiric cefepime for possible superimposed HCAP, culture negative, day 6 of 7 on 5/17   Acute on CKD stage 3b, oliguric/anuric  baseline cr ~1.5 to 2.5 even dating back 2 yrs ago. Worsening BUN/Cr,  Total body volume overload Hyperkalemia, improved Plan Appreciate nephrology management CVVHD, volume removal.  Hopefully this will facilitate improvement in ventilator needs Follow BMP, urine output (minimal)   Uncontrolled DM w/ labile glycemic control Plan Sliding scale insulin protocol Levemir 15 units twice daily, plan to increase 5/17 Add tube feeding coverage 5/17  Elevated D Dimer: likely due to [place]. VTE essentially ruled out dopplers and VQ scan completed on  4/30 Plan Continue Lovenox as ordered  PAD: s/p recent stent to LLE Plan Aspirin, Plavix as ordered  Thrombocytopenia: Suspect due to acute illness Following CBC Follow for any evidence of active blood loss    Best practice:  Diet: start TF Pain/Anxiety/Delirium protocol (if indicated): Propofol/fentanyl, goal RASS -5 VAP protocol (if indicated): Yes DVT prophylaxis: Subcutaneous heparin GI prophylaxis: PPI Glucose control: SSI Mobility: BR Code Status: FULL Family Communication: Updated the patient's daughter by phone on 5/17 Disposition: ICU   Independent CC time 33 minutes   Baltazar Apo, MD, PhD 02/02/2020, 7:59 AM [facility] [phone] or if no answer [phone]

## 2020-02-02 NOTE — Progress Notes (Signed)
OT Cancellation Note  Patient Details Name: Donnelle Mcbroom MRN: [id number] DOB: 1956-03-30   Cancelled Treatment:    Reason Eval/Treat Not Completed: Medical issues which prohibited therapy(Pt intubated, sedated and on CRRT. Will sign off and await new order.  Malka So 02/02/2020, 9:54 AM  Nestor Lewandowsky, OTR/L Acute Rehabilitation Services Pager: [phone] Office: [phone]

## 2020-02-02 NOTE — Progress Notes (Signed)
PT Cancellation Note  Patient Details Name: Gem Sayson MRN: [id number] DOB: 10/31/1955   Cancelled Treatment:    Reason Eval/Treat Not Completed: Patient not medically ready. Patient will be followed and see when appropriate   Conetta,Kristyn 02/02/2020, 9:52 AM

## 2020-02-03 ENCOUNTER — Other Ambulatory Visit: Payer: Self-pay | Admitting: *Deleted

## 2020-02-03 ENCOUNTER — Inpatient Hospital Stay (HOSPITAL_COMMUNITY): Payer: BC Managed Care – PPO

## 2020-02-03 DIAGNOSIS — I739 Peripheral vascular disease, unspecified: Secondary | ICD-10-CM

## 2020-02-03 DIAGNOSIS — I998 Other disorder of circulatory system: Secondary | ICD-10-CM

## 2020-02-03 LAB — POCT I-STAT 7, (LYTES, BLD GAS, ICA,H+H)
Acid-Base Excess: 0 mmol/L (ref 0.0–2.0)
Acid-base deficit: 1 mmol/L (ref 0.0–2.0)
Bicarbonate: 27 mmol/L (ref 20.0–28.0)
Bicarbonate: 27.2 mmol/L (ref 20.0–28.0)
Calcium, Ion: 1.23 mmol/L (ref 1.15–1.40)
Calcium, Ion: 1.24 mmol/L (ref 1.15–1.40)
HCT: 30 % — ABNORMAL LOW (ref 36.0–46.0)
HCT: 32 % — ABNORMAL LOW (ref 36.0–46.0)
Hemoglobin: 10.2 g/dL — ABNORMAL LOW (ref 12.0–15.0)
Hemoglobin: 10.9 g/dL — ABNORMAL LOW (ref 12.0–15.0)
O2 Saturation: 95 %
O2 Saturation: 78 %
Patient temperature: 94.4
Patient temperature: 97.4
Potassium: 3.8 mmol/L (ref 3.5–5.1)
Potassium: 5.1 mmol/L (ref 3.5–5.1)
Sodium: 134 mmol/L — ABNORMAL LOW (ref 135–145)
Sodium: 133 mmol/L — ABNORMAL LOW (ref 135–145)
TCO2: 29 mmol/L (ref 22–32)
TCO2: 29 mmol/L (ref 22–32)
pCO2 arterial: 51.8 mmHg — ABNORMAL HIGH (ref 32.0–48.0)
pCO2 arterial: 61.6 mmHg — ABNORMAL HIGH (ref 32.0–48.0)
pH, Arterial: 7.314 — ABNORMAL LOW (ref 7.350–7.450)
pH, Arterial: 7.25 — ABNORMAL LOW (ref 7.350–7.450)
pO2, Arterial: 77 mmHg — ABNORMAL LOW (ref 83.0–108.0)
pO2, Arterial: 49 mmHg — ABNORMAL LOW (ref 83.0–108.0)

## 2020-02-03 LAB — RENAL FUNCTION PANEL
Albumin: 2.5 g/dL — ABNORMAL LOW (ref 3.5–5.0)
Albumin: 2.3 g/dL — ABNORMAL LOW (ref 3.5–5.0)
Albumin: 2.4 g/dL — ABNORMAL LOW (ref 3.5–5.0)
Anion gap: 9 (ref 5–15)
Anion gap: 11 (ref 5–15)
Anion gap: 9 (ref 5–15)
BUN: 75 mg/dL — ABNORMAL HIGH (ref 8–23)
BUN: 43 mg/dL — ABNORMAL HIGH (ref 8–23)
BUN: 34 mg/dL — ABNORMAL HIGH (ref 8–23)
CO2: 29 mmol/L (ref 22–32)
CO2: 26 mmol/L (ref 22–32)
CO2: 25 mmol/L (ref 22–32)
Calcium: 8.1 mg/dL — ABNORMAL LOW (ref 8.9–10.3)
Calcium: 8.3 mg/dL — ABNORMAL LOW (ref 8.9–10.3)
Calcium: 8.1 mg/dL — ABNORMAL LOW (ref 8.9–10.3)
Chloride: 99 mmol/L (ref 98–111)
Chloride: 99 mmol/L (ref 98–111)
Chloride: 103 mmol/L (ref 98–111)
Creatinine, Ser: 3.34 mg/dL — ABNORMAL HIGH (ref 0.44–1.00)
Creatinine, Ser: 1.78 mg/dL — ABNORMAL HIGH (ref 0.44–1.00)
Creatinine, Ser: 1.44 mg/dL — ABNORMAL HIGH (ref 0.44–1.00)
GFR calc Af Amer: 16 mL/min — ABNORMAL LOW (ref >60)
GFR calc Af Amer: 35 mL/min — ABNORMAL LOW (ref >60)
GFR calc Af Amer: 45 mL/min — ABNORMAL LOW (ref >60)
GFR calc non Af Amer: 14 mL/min — ABNORMAL LOW (ref >60)
GFR calc non Af Amer: 30 mL/min — ABNORMAL LOW (ref >60)
GFR calc non Af Amer: 39 mL/min — ABNORMAL LOW (ref >60)
Glucose, Bld: 307 mg/dL — ABNORMAL HIGH (ref 70–99)
Glucose, Bld: 218 mg/dL — ABNORMAL HIGH (ref 70–99)
Glucose, Bld: 169 mg/dL — ABNORMAL HIGH (ref 70–99)
Phosphorus: 5.8 mg/dL — ABNORMAL HIGH (ref 2.5–4.6)
Phosphorus: 3.8 mg/dL (ref 2.5–4.6)
Phosphorus: 4.1 mg/dL (ref 2.5–4.6)
Potassium: 5.1 mmol/L (ref 3.5–5.1)
Potassium: 3.7 mmol/L (ref 3.5–5.1)
Potassium: 5 mmol/L (ref 3.5–5.1)
Sodium: 137 mmol/L (ref 135–145)
Sodium: 136 mmol/L (ref 135–145)
Sodium: 137 mmol/L (ref 135–145)

## 2020-02-03 LAB — DIC (DISSEMINATED INTRAVASCULAR COAGULATION)PANEL
D-Dimer, Quant: 2.46 ug/mL-FEU — ABNORMAL HIGH (ref 0.00–0.50)
Fibrinogen: 551 mg/dL — ABNORMAL HIGH (ref 210–475)
INR: 1.1 (ref 0.8–1.2)
Platelets: DECREASED 10*3/uL (ref 150–400)
Prothrombin Time: 14 seconds (ref 11.4–15.2)
aPTT: 30 seconds (ref 24–36)

## 2020-02-03 LAB — CBC
HCT: 30.9 % — ABNORMAL LOW (ref 36.0–46.0)
Hemoglobin: 9.4 g/dL — ABNORMAL LOW (ref 12.0–15.0)
MCH: 32.1 pg (ref 26.0–34.0)
MCHC: 30.4 g/dL (ref 30.0–36.0)
MCV: 105.5 fL — ABNORMAL HIGH (ref 80.0–100.0)
Platelets: 43 10*3/uL — ABNORMAL LOW (ref 150–400)
RBC: 2.93 MIL/uL — ABNORMAL LOW (ref 3.87–5.11)
RDW: 22 % — ABNORMAL HIGH (ref 11.5–15.5)
WBC: 15.2 10*3/uL — ABNORMAL HIGH (ref 4.0–10.5)
nRBC: 6.6 % — ABNORMAL HIGH (ref 0.0–0.2)

## 2020-02-03 LAB — GLUCOSE, CAPILLARY
Glucose-Capillary: 194 mg/dL — ABNORMAL HIGH (ref 70–99)
Glucose-Capillary: 152 mg/dL — ABNORMAL HIGH (ref 70–99)
Glucose-Capillary: 140 mg/dL — ABNORMAL HIGH (ref 70–99)
Glucose-Capillary: 146 mg/dL — ABNORMAL HIGH (ref 70–99)
Glucose-Capillary: 172 mg/dL — ABNORMAL HIGH (ref 70–99)
Glucose-Capillary: 259 mg/dL — ABNORMAL HIGH (ref 70–99)

## 2020-02-03 LAB — MAGNESIUM: Magnesium: 2.4 mg/dL (ref 1.7–2.4)

## 2020-02-03 MED ORDER — SODIUM CHLORIDE 0.9 % IV SOLN
2.0000 g | Freq: Two times a day (BID) | INTRAVENOUS | Status: AC
  Administered 2020-02-03: 2 g via INTRAVENOUS
  Filled 2020-02-03: qty 2

## 2020-02-03 MED ORDER — PRISMASOL BGK 4/2.5 32-4-2.5 MEQ/L REPLACEMENT SOLN
Status: DC
  Filled 2020-02-03 (×4): qty 5000

## 2020-02-03 MED ORDER — SODIUM CHLORIDE 0.9% FLUSH
3.0000 mL | Freq: Two times a day (BID) | INTRAVENOUS | Status: DC
  Administered 2020-02-03 – 2020-02-04 (×2): 3 mL via INTRAVENOUS
  Administered 2020-02-04 – 2020-02-05 (×2): 10 mL via INTRAVENOUS

## 2020-02-03 MED ORDER — SODIUM CHLORIDE 0.9 % IV SOLN: 2.0000 g | Freq: Two times a day (BID) | INTRAVENOUS | Status: DC

## 2020-02-03 MED ORDER — VASOPRESSIN 20 UNIT/ML IV SOLN
0.0300 [IU]/min | INTRAVENOUS | Status: DC
  Administered 2020-02-03: 0.04 [IU]/min via INTRAVENOUS
  Administered 2020-02-06: 0.03 [IU]/min via INTRAVENOUS
  Filled 2020-02-03 (×3): qty 2

## 2020-02-03 MED ORDER — SODIUM CHLORIDE 0.9% FLUSH: 3.0000 mL | INTRAVENOUS | Status: DC | PRN

## 2020-02-03 MED ORDER — PRISMASOL BGK 4/2.5 32-4-2.5 MEQ/L REPLACEMENT SOLN
Status: DC
  Filled 2020-02-03 (×3): qty 5000

## 2020-02-03 MED ORDER — PRISMASOL BGK 4/2.5 32-4-2.5 MEQ/L IV SOLN
INTRAVENOUS | Status: DC
  Filled 2020-02-03 (×12): qty 5000

## 2020-02-03 MED ORDER — HEPARIN SODIUM (PORCINE) 1000 UNIT/ML DIALYSIS
1000.0000 [IU] | INTRAMUSCULAR | Status: DC | PRN
  Filled 2020-02-03: qty 6
  Filled 2020-02-03: qty 4

## 2020-02-03 MED ORDER — SODIUM CHLORIDE 0.9 % IV SOLN
250.0000 mL | INTRAVENOUS | Status: DC | PRN
  Administered 2020-02-03: 250 mL via INTRAVENOUS

## 2020-02-03 MED ORDER — ARGATROBAN 50 MG/50ML IV SOLN
2.0000 ug/kg/min | INTRAVENOUS | Status: DC
  Administered 2020-02-03 – 2020-02-06 (×13): 2 ug/kg/min via INTRAVENOUS
  Filled 2020-02-03 (×17): qty 50

## 2020-02-03 NOTE — Progress Notes (Addendum)
NAME:  [name], MRN:  [id number], DOB:  1956/07/29, LOS: 21 ADMISSION DATE:  01/14/2020, CONSULTATION DATE:  5/11 REFERRING MD:  Dr Candiss Norse, CHIEF COMPLAINT:  Hypoxia   Brief History   64 year old female admitted 4/26 for COVID pneumonia and CHF. Initially improved with the usual treatment. Was on room air. 5/8 developed worsening hypoxia that was progressive despite diuresis. As of 5/11 required NRB and HFNC at 35LPM and became lethargic. PCCM consulted.    Past Medical History   has a past medical history of Achilles rupture, Anxiety, Arthritis, Asthma, Chronic back pain, Chronic diastolic heart failure ([place]) (11/30/2016), Chronic headaches, Chronic leg pain, CHRONIC OBSTRUCTIVE PULMONARY DISEASE, MODERATE (03/26/2010), Depression, Diabetes mellitus (1999), Glaucoma, Gout, H/O gestational diabetes mellitus, not currently pregnant, Hyperlipidemia, Hypertension, Morbid obesity ([place]), Obstructive sleep apnea on CPAP (07/02/2009), OSA (obstructive sleep apnea), and Sciatica.   Significant Hospital Events   4/18-4/23>> admit to [facility]-s/p stent to left distal SFA 4/27>> admit to [facility] for hypoxia from Covid/heart failure 4/27>> lower extremity Dopplers negative for DVT. [id number] grade 2 diastolic dysfunction, moderate RV systolic dysfunction.  RVSP 62.6 mmHg. 4/30>> VQ scan-low probability PE 5/5>> improved-on room air 5/8>> worsening hypoxemia-on 15 L of oxygen-with dramatic response to Lasix-down to 4 L 5/10 -5/12 once again requiring oxygen. Had been down to room air prior to this. Oxygen requirements progressed to point where she required intubation. 5/10 weight was up significantly. 106.4 to 109.7 Kg. 5/12 intubated. ABX started. Lasix escalated. LE [place] repeated and was placed on therapeutic LMWH. Post intubation film w/ small pneumomediastinum. Tube feeds started. Pulse steroids started  Consults:    Procedures:  5/12 ETT >> RUE PICC 5/12>> BL chest tubes 5/13 >>  Significant Diagnostic  Tests:  Lower extremity doppler 4/27 > no evidence of DVT Echo 4/29 > Grade II DD, moderate reduction of RV systolic function.  V/Q 4/30 > No evidence PE US renal 4/30 > normal size kidneys, no hydro.   Micro Data:  COVID pos 4/27  Flu neg 4/27  Blood cx neg 4/28 > 5/12 BC >> neg 5/12 resp >> GPC pairs >> nml flora 5/14 respiratory >> few Candida  Antimicrobials:  Cefepime 5/12 > 5/18 Vancomycin  5/12 >zyvox (changed due to cr)>>> 5/12 >> 514  Interim history/subjective:   Sedated and paralyzed Remains on CVVHD good volume removal, I/O+ 5.6 L total; UF 150cc/h  Changed to pressure control ventilation 5/17, currently FiO2 0.90, PEEP 14. Pplat remains high >> 44 Fentanyl 300, midazolam eight, cisatracurium Norepinephrine weaned to 9 CVP 8  Objective   Blood pressure (!) 137/42, pulse (!) 55, temperature (!) 94.4 F (34.7 C), temperature source Axillary, resp. rate (!) 35, height 5\' 3"  (1.6 m), weight 113.8 kg, last menstrual period 02/16/2017, SpO2 99 %. CVP:  [0 mmHg-10 mmHg] 8 mmHg  Vent Mode: PCV FiO2 (%):  [90 %-100 %] 90 % Set Rate:  [35 bmp] 35 bmp Vt Set:  [370 mL-420 mL] 420 mL PEEP:  [14 cmH20-16 cmH20] 14 cmH20 Plateau Pressure:  [43 cmH20-48 cmH20] 46 cmH20   Intake/Output Summary (Last 24 hours) at 02/03/2020 0514 Last data filed at 02/03/2020 0500 Gross per 24 hour  Intake 3078.87 ml  Output 6695 ml  Net -3616.13 ml   Filed Weights   01/30/20 0322 01/31/20 0500 02/02/20 0500  Weight: 104.4 kg 108.5 kg 113.8 kg    Examination:  General: Obese woman, intubated, sedated HEENT: ET tube in good position Neuro: Heavily sedated, paralyzed CV:  Distant, no murmur PULM: Coarse bilaterally, decreased at both bases, no wheezing Bilateral chest tubes in place without air leak GI: Obese, nondistended, positive bowel sounds GU: Oliguric Extremities: No edema, warm Skin: No rash  Chest x-ray 5/18 reviewed by me, persistent B infiltrates, improved. No evidence  residual PTX, B chest tubes in place   Resolved Hospital Problem list     Assessment & Plan:   ARDS due to COVID 19 pneumonia: positive test 4/27. S/p Remdesivir, decadron, actemra .  Interestingly oxygen requirements initially improved and then worsened>> not typical course for Covid pneumonia but no other cause identified other than progressive CHF Acute on chronic HFpEF  COPD +/- acute exacerbation OSA: non-complaint with CPAP Plan Transition to pressure control ventilation, attempting to target VT 6 cc/kg Wean PEEP and FiO2 as able. Continue continuous neuromuscular blockade 5/18 Bilateral chest tubes to suction for pneumothoraces and pneumomediastinum Solu-Medrol weaned to 40 mg once daily, continue to wean over the next 48-hour, goal to off Bicarbonate infusion discontinued 5/17  Pneumomediastinum with bilateral pneumothoraces Bilateral chest tubes in place, to suction without air leak Follow chest x-ray for stability of pneumothoraces Wean PEEP as able  Multifactorial shock, likely related to sedation versus sepsis not present on admission Bradycardia -2/2 propofol Plan CVP eight, currently at goal Weaning norepinephrine as able, off Complete 7 days of cefepime for possible superimposed HCAP today 5/18   Acute on CKD stage 3b, oliguric/anuric  baseline cr ~1.5 to 2.5 even dating back 2 yrs ago. Worsening BUN/Cr,  Total body volume overload Hyperkalemia, improved Plan Appreciate nephrology management CVVHD, continue to push volume removal. Hopefully this will facilitate PEEP and FiO2 weaning Follow BMP, urine output   Uncontrolled DM w/ labile glycemic control Plan Sliding scale insulin protocol Levemir increased to 20 units twice daily on 5/17 Tube feed coverage 6 units every 4 hours  Elevated D Dimer: likely due to [place]. VTE essentially ruled out dopplers and VQ scan completed on 4/30 Plan Heparin subcutaneous prophylaxis as ordered  PAD: s/p recent stent  to LLE Plan Aspirin, Plavix  Thrombocytopenia: Suspect due to acute illness Following CBC, progressive drop this morning, down to 43K Stop heparin, continue aspirin and Plavix Send DIC panel, HITT panel    Best practice:  Diet: start TF Pain/Anxiety/Delirium protocol (if indicated): Propofol/fentanyl, goal RASS -5 VAP protocol (if indicated): Yes DVT prophylaxis: Subcutaneous heparin GI prophylaxis: PPI Glucose control: SSI Mobility: BR Code Status: FULL Family Communication: Updated daughter 5/18 Disposition: ICU   Independent CC time 33 minutes   Baltazar Apo, MD, PhD 02/03/2020, 5:14 AM [facility] [phone] or if no answer [phone]

## 2020-02-03 NOTE — Progress Notes (Signed)
[facility] Progress Note   64 year old female admitted on 4/27for Covid pneumonia and heart failure exacerbation w/ h/o  chronic pain, HF,  COPD, diabetes, hyperlipidemia, hypertension, obesity, OSA, PAD.  VQ scan which was negative for pulmonary embolism. Patient appeared to be getting better but on 5/8 developed worsening hypoxia that progressed despite diuresis. She was then intubated for refractory hypoxia on 5/12.  Her baseline creatinine is around 1.5-2.   Assessment/ Plan:   1. Acute renal failure, oliguric AKI: Most likely ATN in the setting of shock with diminishing urine output and subsequent electrolyte derangements. Also with volume overload and increasing oxygen requirement. Only acute indication for dialysis is refractory volume overload with difficulties ventilation now on 100% FiO2.  CRRT initiated 5/16 (no issues w/ filter clotting)  1. Continue CRRT (5/16- ...) 1. Continue 2K/2.5 calcium solution for prefilter, post filter, dialysate 2. Will adjust solution as needed; will change to 4 K (5/18) 3. 100-150 cc/h of ultrafiltration -> tolerating (decr to 100 in the AM; we are getting closer) 2. Urinalysis not active -> likely ATN 3. Anuric. 4. Avoid nephrotoxic agents if possible 5. Could consider renal ultrasound oncemore stable -> obstruction lower on differential. 2. Hypoxic respiratory failure: Associated with Covid pneumonia possibly bacterial superinfection now with volume overload and CHF contributing 1. CRRT for ultrafiltration as above 2. Ventilation per primary team 3. Hyperkalemia: resolved 4. Hyperphosphatemia: Level 7.2  Associated with kidney injury. Likely will improve with CRRT 5. Covid infection: Status post topical treatments with some improvement now with worsening respiratory status. Primary team managing. 6. Hypoalbuminemia: Likely associated with acute illness and malnutrition. Could make volume removal difficult. 7. Shock:  Multifactorial with medication induced (propofol) and sepsis contributing. On norepinephrine. Blood pressure much better today. Primary team can wean pressors as needed. On cefepime and vancomycin. Willneed to adjust dosing of medications based on CRRT. 8. Respiratory acidosis: Only partial metabolic compensation likely associated with kidney injury. Repeat ABG and consider stopping IV bicarbonate based on results.  9. Diabetes mellitus type 2: With hyperglycemia, uncontrolled. Primary team to manage. 10. Hyperlipidemia: Atorvastatin 40 mg daily, aspirin 81 mg daily.  Subjective:   Remains  Anuric with stable BP; on 70% FIO2. On levo 11 mcg.    Objective:   BP (!) 115/39   Pulse 62   Temp (!) 97.3 F (36.3 C) (Oral)   Resp (!) 35   Ht _0  (1.6 m)   Wt 113.8 kg   LMP 02/16/2017 (Within Weeks) Comment: tubal ligation  SpO2 90%   BMI 44.44 kg/m   Intake/Output Summary (Last 24 hours) at 02/03/2020 1017 Last data filed at 02/03/2020 0900 Gross per 24 hour  Intake 2725.16 ml  Output 6443 ml  Net -3717.84 ml   Weight change:   Physical Exam: GEN:Ill-appearing, lying in bed, ventilated EYES: no scleral icterus,eyes closed with mild periorbital edema CV:RRR, no obvious murmurs PULM:Bilateral chest rise, ventilated, coarse breath sounds bilaterally PZW:CHENI sounds present, nondistended SKIN: no rashes or jaundice EXT:1+ pitting edema in the bilateral lower extremities, warm and well perfused ACCESS: RIJ temp  Imaging: DG Chest Port 1 View  Result Date: 02/03/2020 CLINICAL DATA:  COVID pneumonia. EXAM: PORTABLE CHEST 1 VIEW COMPARISON:  Multiple previous chest films. The most recent is yesterday. FINDINGS: The endotracheal tube, feeding tube, right IJ catheter, right PICC line and bilateral chest tubes are stable. Persistent diffuse interstitial and airspace infiltrates, consistent with COVID pneumonia. No obvious pneumomediastinum or pneumopericardium. IMPRESSION: 1.  Stable support  apparatus. 2. Persistent diffuse interstitial and airspace infiltrates consistent with COVID pneumonia. 3. Bilateral chest tubes without pneumothorax. Electronically Signed   By: Marijo Sanes M.D.   On: 02/03/2020 08:01   DG Chest Port 1 View  Result Date: 02/02/2020 CLINICAL DATA:  Acute respiratory distress syndrome due to COVID-19 EXAM: PORTABLE CHEST 1 VIEW COMPARISON:  Yesterday FINDINGS: Bilateral chest tube in place. Two right-sided central lines in good position with tips at the SVC. The feeding tube at least reaches the stomach. Endotracheal tube tip is just below the clavicular heads. Generalized interstitial and airspace opacity. Cardiomegaly which may be related to rotation. Persistent lucency along the right heart border. IMPRESSION: 1. Stable hardware positioning. 2. Small right anterior pneumothorax versus pneumomediastinum. Electronically Signed   By: Monte Fantasia M.D.   On: 02/02/2020 07:25    Labs: BMET Recent Labs  Lab 01/29/20 0500 01/29/20 2030 01/30/20 0322 01/30/20 0428 01/31/20 0500 01/31/20 0500 01/31/20 1653 01/31/20 1653 02/01/20 0557 02/01/20 1500 02/01/20 1730 02/01/20 1730 02/02/20 0415 02/02/20 0420 02/02/20 1245 02/02/20 1602 02/02/20 1625 02/03/20 0335 02/03/20 0423  NA   < >  --  136   < > 139   < > 137   < > 138   < > 140   < > 137 134* 135 137 134* 134* 136  K   < >  --  5.8*   < > 5.0   < > 5.5*   < > 5.4*   < > 4.8   < > 5.1 4.9 4.7 4.5 4.6 3.8 3.7  CL   < >  --  100   < > 101  --  98  --  94*  --  95*  --  99  --   --  101  --   --  99  CO2   < >  --  26   < > 27  --  30  --  27  --  29  --  29  --   --  28  --   --  26  GLUCOSE   < >  --  466*   < > 176*  --  341*  --  406*  --  317*  --  307*  --   --  215*  --   --  218*  BUN   < >  --  66*   < > 82*  --  91*  --  110*  --  111*  --  75*  --   --  51*  --   --  43*  CREATININE   < >  --  3.71*   < > 4.79*  --  4.84*  --  5.26*  --  5.02*  --  3.34*  --   --  2.35*  --    --  1.78*  CALCIUM   < >  --  7.7*   < > 7.7*  --  7.6*  --  7.8*  --  7.8*  --  8.1*  --   --  8.0*  --   --  8.3*  PHOS  --  7.8* 9.1*  --   --   --   --   --  7.2*  --  6.9*  --  5.8*  --   --  5.0*  --   --  3.8   < > = values in this interval not displayed.   CBC  Recent Labs  Lab 01/28/20 0403 01/28/20 0403 01/29/20 0500 01/29/20 0554 01/30/20 0322 01/30/20 0428 01/31/20 0500 02/01/20 1500 02/02/20 0415 02/02/20 0420 02/02/20 1245 02/02/20 1625 02/03/20 0335 02/03/20 0423  WBC 13.4*   < > 27.2*   < > 21.7*  --  20.6*  --  19.5*  --   --   --   --  15.2*  NEUTROABS 12.2*  --  25.0*  --  20.6*  --  17.7*  --   --   --   --   --   --   --   HGB 10.8*   < > 11.4*   < > 10.2*   < > 9.9*   < > 10.0*   < > 15.6* 10.5* 10.2* 9.4*  HCT 34.5*   < > 35.1*   < > 34.5*   < > 32.1*   < > 32.3*   < > 46.0 31.0* 30.0* 30.9*  MCV 101.2*   < > 104.8*   < > 107.8*  --  104.6*  --  105.6*  --   --   --   --  105.5*  PLT 121*   < > 106*   < > 90*  --  67*  --  59*  --   --   --   --  43*   < > = values in this interval not displayed.    Medications:    . artificial tears  1 application Both Eyes Y5O  . vitamin C  500 mg Per Tube Daily  . aspirin  81 mg Per Tube Daily  . atorvastatin  40 mg Per Tube Daily  . brimonidine  1 drop Both Eyes BID  . chlorhexidine gluconate (MEDLINE KIT)  15 mL Mouth Rinse BID  . Chlorhexidine Gluconate Cloth  6 each Topical Daily  . clopidogrel  75 mg Per Tube Daily  . dorzolamide-timolol  1 drop Both Eyes BID  . feeding supplement (PRO-STAT SUGAR FREE 64)  30 mL Per Tube QID  . fentaNYL (SUBLIMAZE) injection  50 mcg Intravenous Once  . heparin injection (subcutaneous)  5,000 Units Subcutaneous Q8H  . insulin aspart  0-20 Units Subcutaneous Q4H  . insulin aspart  6 Units Subcutaneous Q4H  . insulin detemir  20 Units Subcutaneous BID  . latanoprost  1 drop Both Eyes QHS  . mouth rinse  15 mL Mouth Rinse 10 times per day  . methylPREDNISolone (SOLU-MEDROL)  injection  40 mg Intravenous Daily  . pantoprazole (PROTONIX) IV  40 mg Intravenous Q24H  . prednisoLONE acetate  1 drop Right Eye BID  . sodium chloride flush  10 mL Intracatheter Q8H  . zinc sulfate  220 mg Per Tube Daily      Otelia Santee, MD 02/03/2020, 9:03 AM

## 2020-02-03 NOTE — Progress Notes (Signed)
PT Cancellation Note  Patient Details Name: [name] MRN: [id number] DOB: 1955/11/07   Cancelled Treatment:    Reason Eval/Treat Not Completed: Medical issues which prohibited therapy.  Pt is no closer to being able to participate.  RN suggests signoff until pt able to work with therapies.  Signing off at this time.  Will await reorder. 02/03/2020  Ginger Carne., PT Acute Rehabilitation Services [phone]  (pager) [phone]  (office)   [name] 02/03/2020, 10:09 AM

## 2020-02-03 NOTE — Progress Notes (Signed)
eLink Physician-Brief Progress Note Patient Name: [name] DOB: 02/11/1956 MRN: [id number]   Date of Service  02/03/2020  HPI/Events of Note  Pt with hypotension  90/38 (MAP 49), subtle ST segment elevation in inferior leads.  eICU Interventions  Ultrafiltration reduced to 50 ml from 150 ml, Argatroban infusion started for acute coronary syndrome, cardiology stat consulted, Vasopressin added to try to increase MAP and perfusion pressure,  in case myocardial ischemia is related to hypoperfusion from low MAP.        Kerry Kass Latamara Melder 02/03/2020, 11:13 PM

## 2020-02-04 ENCOUNTER — Inpatient Hospital Stay (HOSPITAL_COMMUNITY): Payer: BC Managed Care – PPO

## 2020-02-04 DIAGNOSIS — R9431 Abnormal electrocardiogram [ECG] [EKG]: Secondary | ICD-10-CM

## 2020-02-04 DIAGNOSIS — I340 Nonrheumatic mitral (valve) insufficiency: Secondary | ICD-10-CM

## 2020-02-04 DIAGNOSIS — I361 Nonrheumatic tricuspid (valve) insufficiency: Secondary | ICD-10-CM

## 2020-02-04 LAB — CBC WITH DIFFERENTIAL/PLATELET
Abs Immature Granulocytes: 0.5 10*3/uL — ABNORMAL HIGH (ref 0.00–0.07)
Basophils Absolute: 0.1 10*3/uL (ref 0.0–0.1)
Basophils Relative: 1 %
Eosinophils Absolute: 1.1 10*3/uL — ABNORMAL HIGH (ref 0.0–0.5)
Eosinophils Relative: 7 %
HCT: 32.9 % — ABNORMAL LOW (ref 36.0–46.0)
Hemoglobin: 9.9 g/dL — ABNORMAL LOW (ref 12.0–15.0)
Immature Granulocytes: 3 %
Lymphocytes Relative: 14 %
Lymphs Abs: 2.1 10*3/uL (ref 0.7–4.0)
MCH: 32.2 pg (ref 26.0–34.0)
MCHC: 30.1 g/dL (ref 30.0–36.0)
MCV: 107.2 fL — ABNORMAL HIGH (ref 80.0–100.0)
Monocytes Absolute: 1.2 10*3/uL — ABNORMAL HIGH (ref 0.1–1.0)
Monocytes Relative: 8 %
Neutro Abs: 10.2 10*3/uL — ABNORMAL HIGH (ref 1.7–7.7)
Neutrophils Relative %: 67 %
Platelets: 49 10*3/uL — ABNORMAL LOW (ref 150–400)
RBC: 3.07 MIL/uL — ABNORMAL LOW (ref 3.87–5.11)
RDW: 22.6 % — ABNORMAL HIGH (ref 11.5–15.5)
WBC: 15.3 10*3/uL — ABNORMAL HIGH (ref 4.0–10.5)
nRBC: 27.2 % — ABNORMAL HIGH (ref 0.0–0.2)

## 2020-02-04 LAB — ECHOCARDIOGRAM LIMITED
Height: 63 in
Weight: 3731.95 oz

## 2020-02-04 LAB — TROPONIN I (HIGH SENSITIVITY)
Troponin I (High Sensitivity): 269 ng/L (ref <18)
Troponin I (High Sensitivity): 296 ng/L (ref <18)

## 2020-02-04 LAB — RENAL FUNCTION PANEL
Albumin: 2.3 g/dL — ABNORMAL LOW (ref 3.5–5.0)
Albumin: 2.4 g/dL — ABNORMAL LOW (ref 3.5–5.0)
Anion gap: 11 (ref 5–15)
Anion gap: 11 (ref 5–15)
BUN: 36 mg/dL — ABNORMAL HIGH (ref 8–23)
BUN: 34 mg/dL — ABNORMAL HIGH (ref 8–23)
CO2: 23 mmol/L (ref 22–32)
CO2: 25 mmol/L (ref 22–32)
Calcium: 8.2 mg/dL — ABNORMAL LOW (ref 8.9–10.3)
Calcium: 8.3 mg/dL — ABNORMAL LOW (ref 8.9–10.3)
Chloride: 102 mmol/L (ref 98–111)
Chloride: 101 mmol/L (ref 98–111)
Creatinine, Ser: 1.47 mg/dL — ABNORMAL HIGH (ref 0.44–1.00)
Creatinine, Ser: 1.36 mg/dL — ABNORMAL HIGH (ref 0.44–1.00)
GFR calc Af Amer: 44 mL/min — ABNORMAL LOW (ref >60)
GFR calc Af Amer: 48 mL/min — ABNORMAL LOW (ref >60)
GFR calc non Af Amer: 38 mL/min — ABNORMAL LOW (ref >60)
GFR calc non Af Amer: 41 mL/min — ABNORMAL LOW (ref >60)
Glucose, Bld: 276 mg/dL — ABNORMAL HIGH (ref 70–99)
Glucose, Bld: 299 mg/dL — ABNORMAL HIGH (ref 70–99)
Phosphorus: 4.4 mg/dL (ref 2.5–4.6)
Phosphorus: 4.2 mg/dL (ref 2.5–4.6)
Potassium: 5.8 mmol/L — ABNORMAL HIGH (ref 3.5–5.1)
Potassium: 5.3 mmol/L — ABNORMAL HIGH (ref 3.5–5.1)
Sodium: 136 mmol/L (ref 135–145)
Sodium: 137 mmol/L (ref 135–145)

## 2020-02-04 LAB — GLUCOSE, CAPILLARY
Glucose-Capillary: 208 mg/dL — ABNORMAL HIGH (ref 70–99)
Glucose-Capillary: 203 mg/dL — ABNORMAL HIGH (ref 70–99)
Glucose-Capillary: 210 mg/dL — ABNORMAL HIGH (ref 70–99)
Glucose-Capillary: 263 mg/dL — ABNORMAL HIGH (ref 70–99)
Glucose-Capillary: 289 mg/dL — ABNORMAL HIGH (ref 70–99)
Glucose-Capillary: 296 mg/dL — ABNORMAL HIGH (ref 70–99)

## 2020-02-04 LAB — CBC
HCT: 32 % — ABNORMAL LOW (ref 36.0–46.0)
Hemoglobin: 9.5 g/dL — ABNORMAL LOW (ref 12.0–15.0)
MCH: 32 pg (ref 26.0–34.0)
MCHC: 29.7 g/dL — ABNORMAL LOW (ref 30.0–36.0)
MCV: 107.7 fL — ABNORMAL HIGH (ref 80.0–100.0)
Platelets: 45 10*3/uL — ABNORMAL LOW (ref 150–400)
RBC: 2.97 MIL/uL — ABNORMAL LOW (ref 3.87–5.11)
RDW: 22.3 % — ABNORMAL HIGH (ref 11.5–15.5)
WBC: 15 10*3/uL — ABNORMAL HIGH (ref 4.0–10.5)
nRBC: 28.9 % — ABNORMAL HIGH (ref 0.0–0.2)

## 2020-02-04 LAB — POCT I-STAT 7, (LYTES, BLD GAS, ICA,H+H)
Acid-base deficit: 5 mmol/L — ABNORMAL HIGH (ref 0.0–2.0)
Bicarbonate: 23.9 mmol/L (ref 20.0–28.0)
Calcium, Ion: 1.21 mmol/L (ref 1.15–1.40)
HCT: 33 % — ABNORMAL LOW (ref 36.0–46.0)
Hemoglobin: 11.2 g/dL — ABNORMAL LOW (ref 12.0–15.0)
O2 Saturation: 77 %
Patient temperature: 97.9
Potassium: 5.5 mmol/L — ABNORMAL HIGH (ref 3.5–5.1)
Sodium: 134 mmol/L — ABNORMAL LOW (ref 135–145)
TCO2: 26 mmol/L (ref 22–32)
pCO2 arterial: 59.4 mmHg — ABNORMAL HIGH (ref 32.0–48.0)
pH, Arterial: 7.211 — ABNORMAL LOW (ref 7.350–7.450)
pO2, Arterial: 50 mmHg — ABNORMAL LOW (ref 83.0–108.0)

## 2020-02-04 LAB — APTT
aPTT: 68 seconds — ABNORMAL HIGH (ref 24–36)
aPTT: 71 seconds — ABNORMAL HIGH (ref 24–36)

## 2020-02-04 LAB — MAGNESIUM: Magnesium: 2.7 mg/dL — ABNORMAL HIGH (ref 1.7–2.4)

## 2020-02-04 MED ORDER — PANTOPRAZOLE SODIUM 40 MG PO PACK
40.0000 mg | PACK | Freq: Every day | ORAL | Status: DC
  Administered 2020-02-04 – 2020-02-05 (×2): 40 mg
  Filled 2020-02-04 (×3): qty 20

## 2020-02-04 MED ORDER — PRISMASOL BGK 0/2.5 32-2.5 MEQ/L IV SOLN
INTRAVENOUS | Status: DC
  Filled 2020-02-04 (×7): qty 5000

## 2020-02-04 MED ORDER — PRISMASOL BGK 0/2.5 32-2.5 MEQ/L IV SOLN
INTRAVENOUS | Status: DC
  Filled 2020-02-04 (×21): qty 5000

## 2020-02-04 MED ORDER — PRISMASOL BGK 0/2.5 32-2.5 MEQ/L IV SOLN
INTRAVENOUS | Status: DC
  Filled 2020-02-04 (×5): qty 5000

## 2020-02-04 NOTE — Progress Notes (Signed)
RT note- Patient transferred to 41M with difficulty, remains on current settings.

## 2020-02-04 NOTE — Progress Notes (Signed)
[facility] Progress Note   64 year old female admitted on 4/27for Covid pneumonia and heart failure exacerbationw/ h/ochronic pain, HF,COPD, diabetes, hyperlipidemia, hypertension, obesity, OSA, PAD.VQ scan which was negative for pulmonary embolism. Patient appeared to be getting better but on 5/8 developed worsening hypoxia that progressed despite diuresis. She was then intubated for refractory hypoxia on 5/12. Her baseline creatinine is around 1.5-2.   Assessment/ Plan:   1. Acute renal failure, oliguric AKI: Most likely ATN in the setting of shock with diminishing urine output and subsequent electrolyte derangements. Also with volume overload and increasing oxygen requirement. CRRT initiated 5/16 (no issues w/ filter clotting)  1. ContinueCRRT (5/16- ...) 1. Will adjust solution with  change to 2 K  2. 0-50 ml/hr  ultrafiltration  2. Urinalysisnot active -> likely ATN 3. Anuric. 4. Avoid nephrotoxic agents if possible  2. Hypoxic respiratory failure: Associated with Covid pneumonia possibly bacterial superinfection now with volume overload and CHF contributing 1. Ventilation per primary team 3. Hyperkalemia: will have to change back to 2K bath 4. Hyperphosphatemia: Level 7.2 Associated with kidney injury. Likely will improve with CRRT 5. Covid infection: Status post topical treatments with some improvement now with worsening respiratory status. Primary team managing. 6. Hypoalbuminemia: Likely associated with acute illness and malnutrition. Could make volume removal difficult. 7. Shock: Multifactorial with medication induced (propofol) and sepsis contributing. On norepinephrine. Blood pressure much better today. Primary team can wean pressors as needed. On cefepime and vancomycin. Willneed to adjust dosing of medications based on CRRT. 8. Respiratory acidosis:Onlypartial metabolic compensation likely associated with kidney injury. Repeat ABG  and consider stopping IV bicarbonate based on results.  9. Diabetes mellitus type 2: With hyperglycemia, uncontrolled. Primary team to manage. 10. Hyperlipidemia: Atorvastatin 40 mg daily, aspirin 81 mg daily.  Subjective:   NSTEMI overnight 5/18-5/19 Hyperkalemic as well.   Objective:   BP (!) 144/42   Pulse (!) 54   Temp (!) 97.5 F (36.4 C) (Oral)   Resp (!) 35   Ht 5' 3"  (1.6 m)   Wt 105.8 kg   LMP 02/16/2017 (Within Weeks) Comment: tubal ligation  SpO2 99%   BMI 41.32 kg/m   Intake/Output Summary (Last 24 hours) at 02/04/2020 1204 Last data filed at 02/04/2020 1100 Gross per 24 hour  Intake 3253.14 ml  Output 5400 ml  Net -2146.86 ml   Weight change:   Physical Exam: GEN:Ill-appearing, lying in bed, ventilated EYES: no scleral icterus,eyes closed with mild periorbital edema CV:RRR, no obvious murmurs PULM:Bilateral chest rise, ventilated, coarse breath sounds bilaterally OIZ:TIWPY sounds present, nondistended SKIN: no rashes or jaundice EXT:1+ pitting edema in the bilateral lower extremities, warm and well perfused ACCESS: RIJ temp  Imaging: DG Chest Port 1 View  Addendum Date: 02/04/2020   ADDENDUM REPORT: 02/04/2020 09:35 ADDENDUM: Critical Value/emergent results were called by telephone at the time of interpretation on 02/04/2020 at 7:50 a.m. to provider Baltazar Apo , who verbally acknowledged these results. Electronically Signed   By: Marin Olp M.D.   On: 02/04/2020 09:35   Result Date: 02/04/2020 CLINICAL DATA:  ARDS. EXAM: PORTABLE CHEST 1 VIEW COMPARISON:  02/03/2020 FINDINGS: Endotracheal tube has tip 2.9 cm above the carina. Enteric tube courses into the region of the stomach and off the film as tip is not visualized. Right IJ central venous catheter unchanged with tip over the SVC. Right-sided PICC line unchanged. Bilateral chest tubes unchanged. Lungs are adequately inflated and demonstrate a new small right-sided pneumothorax. There is  persistent mixed  interstitial airspace density over the central lungs which may be due to mild edema versus infection. No definite effusion. Cardiomediastinal silhouette and remainder of the exam is unchanged. IMPRESSION: 1.  New small right-sided pneumothorax. 2. Stable bilateral mixed interstitial airspace density which may be due to edema versus infection. Stable cardiomegaly. 3.  Tubes and lines as described. Electronically Signed: By: Marin Olp M.D. On: 02/04/2020 07:47   DG Chest Port 1 View  Result Date: 02/03/2020 CLINICAL DATA:  COVID pneumonia. EXAM: PORTABLE CHEST 1 VIEW COMPARISON:  Multiple previous chest films. The most recent is yesterday. FINDINGS: The endotracheal tube, feeding tube, right IJ catheter, right PICC line and bilateral chest tubes are stable. Persistent diffuse interstitial and airspace infiltrates, consistent with COVID pneumonia. No obvious pneumomediastinum or pneumopericardium. IMPRESSION: 1. Stable support apparatus. 2. Persistent diffuse interstitial and airspace infiltrates consistent with COVID pneumonia. 3. Bilateral chest tubes without pneumothorax. Electronically Signed   By: Marijo Sanes M.D.   On: 02/03/2020 08:01    Labs: BMET Recent Labs  Lab 02/01/20 0557 02/01/20 1500 02/01/20 1730 02/01/20 1730 02/02/20 0415 02/02/20 0420 02/02/20 1602 02/02/20 1602 02/02/20 1625 02/03/20 0335 02/03/20 0423 02/03/20 1541 02/03/20 1556 02/04/20 0417 02/04/20 0437  NA 138   < > 140   < > 137   < > 137   < > 134* 134* 136 133* 137 134* 136  K 5.4*   < > 4.8   < > 5.1   < > 4.5   < > 4.6 3.8 3.7 5.1 5.0 5.5* 5.8*  CL 94*  --  95*  --  99  --  101  --   --   --  99  --  103  --  102  CO2 27  --  29  --  29  --  28  --   --   --  26  --  25  --  23  GLUCOSE 406*  --  317*  --  307*  --  215*  --   --   --  218*  --  169*  --  276*  BUN 110*  --  111*  --  75*  --  51*  --   --   --  43*  --  34*  --  36*  CREATININE 5.26*  --  5.02*  --  3.34*  --  2.35*   --   --   --  1.78*  --  1.44*  --  1.47*  CALCIUM 7.8*  --  7.8*  --  8.1*  --  8.0*  --   --   --  8.3*  --  8.1*  --  8.2*  PHOS 7.2*  --  6.9*  --  5.8*  --  5.0*  --   --   --  3.8  --  4.1  --  4.4   < > = values in this interval not displayed.   CBC Recent Labs  Lab 01/29/20 0500 01/29/20 0554 01/30/20 0322 01/30/20 0428 01/31/20 0500 02/01/20 1500 02/02/20 0415 02/02/20 0420 02/03/20 0423 02/03/20 0423 02/03/20 1151 02/03/20 1541 02/04/20 0037 02/04/20 0417 02/04/20 0437  WBC 27.2*   < > 21.7*   < > 20.6*  --  19.5*  --  15.2*  --   --   --  15.3*  --  15.0*  NEUTROABS 25.0*  --  20.6*  --  17.7*  --   --   --   --   --   --   --  10.2*  --   --   HGB 11.4*   < > 10.2*   < > 9.9*   < > 10.0*   < > 9.4*   < >  --  10.9* 9.9* 11.2* 9.5*  HCT 35.1*   < > 34.5*   < > 32.1*   < > 32.3*   < > 30.9*   < >  --  32.0* 32.9* 33.0* 32.0*  MCV 104.8*   < > 107.8*   < > 104.6*  --  105.6*  --  105.5*  --   --   --  107.2*  --  107.7*  PLT 106*   < > 90*   < > 67*  --  59*   < > 43*  --  PLATELET CLUMPS NOTED ON SMEAR, COUNT APPEARS DECREASED  --  49*  --  45*   < > = values in this interval not displayed.    Medications:    . artificial tears  1 application Both Eyes E4L  . vitamin C  500 mg Per Tube Daily  . aspirin  81 mg Per Tube Daily  . atorvastatin  40 mg Per Tube Daily  . brimonidine  1 drop Both Eyes BID  . chlorhexidine gluconate (MEDLINE KIT)  15 mL Mouth Rinse BID  . Chlorhexidine Gluconate Cloth  6 each Topical Daily  . clopidogrel  75 mg Per Tube Daily  . dorzolamide-timolol  1 drop Both Eyes BID  . feeding supplement (PRO-STAT SUGAR FREE 64)  30 mL Per Tube QID  . fentaNYL (SUBLIMAZE) injection  50 mcg Intravenous Once  . insulin aspart  0-20 Units Subcutaneous Q4H  . insulin aspart  6 Units Subcutaneous Q4H  . insulin detemir  20 Units Subcutaneous BID  . latanoprost  1 drop Both Eyes QHS  . mouth rinse  15 mL Mouth Rinse 10 times per day  . pantoprazole  sodium  40 mg Per Tube Daily  . prednisoLONE acetate  1 drop Right Eye BID  . sodium chloride flush  10 mL Intracatheter Q8H  . sodium chloride flush  3 mL Intravenous Q12H  . zinc sulfate  220 mg Per Tube Daily      Otelia Santee, MD 02/04/2020, 12:04 PM

## 2020-02-04 NOTE — Progress Notes (Signed)
[facility] Progress Note Patient Name: [name] DOB: [birth year] MRN: [id number]   Date of Service  02/04/2020  HPI/Events of Note  Troponin 269, ABG result noted.  eICU Interventions  Medical management of ACS in progress. RR increased to 35, PEEP increased to 12, Vt increased to 7 ml / kg.        Kerry Kass Leiya Keesey 02/04/2020, 5:11 AM

## 2020-02-04 NOTE — Progress Notes (Signed)
eLink Physician-Brief Progress Note Patient Name: [name] DOB: [birth year] MRN: [id number]   Date of Service  02/04/2020  HPI/Events of Note  SBP 140-150 mmHg, heart rate 46. Pt with history of recent MI.  eICU Interventions  Will monitor Pt closely but withhold chronotropic Rx for now given asymptomatic status, hemodynamic stability and recent MI where a significant increase in her heart rate might put her at risk of extending her infarct.        [name] 02/04/2020, 11:14 PM

## 2020-02-04 NOTE — Progress Notes (Addendum)
Inpatient Diabetes Program Recommendations  AACE/ADA: New Consensus Statement on Inpatient Glycemic Control (2015)  Target Ranges:  Prepandial:   less than 140 mg/dL      Peak postprandial:   less than 180 mg/dL (1-2 hours)      Critically ill patients:  140 - 180 mg/dL   Lab Results  Component Value Date   GLUCAP 210 (H) 02/04/2020   HGBA1C 12.2 (H) 01/05/2020    Review of Glycemic Control Results for [name], [name] (MRN [id number]) as of 02/04/2020 11:19  Ref. Range 02/03/2020 23:11 02/04/2020 03:07 02/04/2020 07:15 02/04/2020 11:07  Glucose-Capillary Latest Ref Range: 70 - 99 mg/dL 259 (H) 208 (H) 203 (H) 210 (H)  Diabetes history:DM 2 Outpatient Diabetes medications:Lantus 50 units qam, Novolog 3-20 units tid with meals for glucose > 150 mg/dl Current orders for Inpatient glycemic control: Levemir 20 units bid, Novolog 6 units q 4 hours Novolog resistant q 4 hours Vital 1.5 Cal @ 50 ml/hr  Inpatient Diabetes Program Recommendations:    Note steroids stopped so CBG's may improve. If blood sugars continue to be > 200 mg/dL, consider slight increase of Levemir to 24 units bid.   Thanks,  Adah Perl, RN, BC-ADM Inpatient Diabetes Coordinator Pager [phone] (8a-5p)

## 2020-02-04 NOTE — Progress Notes (Signed)
ANTICOAGULATION CONSULT NOTE - Initial Consult  Pharmacy Consult:  Argatroban Indication:  Rule out HIT in setting of possible DIC  Allergies  Allergen Reactions  . Heparin     Possible HIT?  Marland Kitchen Pork Allergy   . Pork-Derived Products Nausea And Vomiting    Patient Measurements: Height: 5\' 3"  (160 cm) Weight: 105.8 kg (233 lb 4 oz) IBW/kg (Calculated) : 52.4  Vital Signs: Temp: 97.5 F (36.4 C) (05/19 1108) Temp Source: Oral (05/19 1108) BP: 144/42 (05/19 0800) Pulse Rate: 51 (05/19 1215)  Labs: Recent Labs    02/03/20 0423 02/03/20 0423 02/03/20 1151 02/03/20 1541 02/03/20 1556 02/04/20 0037 02/04/20 0037 02/04/20 0220 02/04/20 0417 02/04/20 0437 02/04/20 0946  HGB 9.4*  --   --    < >  --  9.9*   < >  --  11.2* 9.5*  --   HCT 30.9*  --   --    < >  --  32.9*  --   --  33.0* 32.0*  --   PLT 43*   < > PLATELET CLUMPS NOTED ON SMEAR, COUNT APPEARS DECREASED  --   --  49*  --   --   --  45*  --   APTT  --   --  30  --   --   --   --   --   --  68* 71*  LABPROT  --   --  14.0  --   --   --   --   --   --   --   --   INR  --   --  1.1  --   --   --   --   --   --   --   --   CREATININE 1.78*  --   --   --  1.44*  --   --   --   --  1.47*  --   TROPONINIHS  --   --   --   --   --   --   --  269*  --  296*  --    < > = values in this interval not displayed.    Estimated Creatinine Clearance: 45.6 mL/min (A) (by C-G formula based on SCr of 1.47 mg/dL (H)).   Medical History: Past Medical History:  Diagnosis Date  . Achilles rupture   . Anxiety   . Arthritis   . Asthma   . Chronic back pain   . Chronic diastolic heart failure ([place]) 11/30/2016  . Chronic headaches   . Chronic leg pain    bilaterally  . CHRONIC OBSTRUCTIVE PULMONARY DISEASE, MODERATE 03/26/2010   Annotation: with moderate restrictive lung disease as well per PFTs  04/02/07--actual numbers not noted in old records Qualifier: Diagnosis of  By: Amil Amen MD, Benjamine Mola    . Depression   . Diabetes  mellitus 1999   T2DM  . Glaucoma   . Gout   . H/O gestational diabetes mellitus, not currently pregnant   . Hyperlipidemia   . Hypertension   . Morbid obesity ([place])   . Obstructive sleep apnea on CPAP 07/02/2009   Qualifier: Diagnosis of  By: Amil Amen MD, Benjamine Mola    . OSA (obstructive sleep apnea)   . Sciatica     Assessment: 45 YOF started on argatroban overnight for possible ACS and rule out HIT.  Cards thinks elevated troponin is likely due to demand ischemia.  Now with possible DIC.  APTT therapeutic x 2.  Platelet count is 45K - no bleeding reported.  Goal of Therapy:  aPTT 50-90 seconds Monitor platelets by anticoagulation protocol: Yes   Plan:  Continue argatroban at 2 mcg/kg/min Daily aPTT and CBC F/u HIT Ab Monitor closely for s/sx of bleeding  Maziah Smola D. Mina Marble, PharmD, BCPS, [place] 02/04/2020, 12:42 PM

## 2020-02-04 NOTE — Progress Notes (Signed)
NAME:  [name], MRN:  [id number], DOB:  22-Jan-1956, LOS: 82 ADMISSION DATE:  01/15/2020, CONSULTATION DATE:  5/11 REFERRING MD:  Dr Candiss Norse, CHIEF COMPLAINT:  Hypoxia   Brief History   64 year old female admitted 4/26 for COVID pneumonia and CHF. Initially improved with the usual treatment. Was on room air. 5/8 developed worsening hypoxia that was progressive despite diuresis. As of 5/11 required NRB and HFNC at 35LPM and became lethargic. PCCM consulted.    Past Medical History   has a past medical history of Achilles rupture, Anxiety, Arthritis, Asthma, Chronic back pain, Chronic diastolic heart failure ([place]) (11/30/2016), Chronic headaches, Chronic leg pain, CHRONIC OBSTRUCTIVE PULMONARY DISEASE, MODERATE (03/26/2010), Depression, Diabetes mellitus (1999), Glaucoma, Gout, H/O gestational diabetes mellitus, not currently pregnant, Hyperlipidemia, Hypertension, Morbid obesity ([place]), Obstructive sleep apnea on CPAP (07/02/2009), OSA (obstructive sleep apnea), and Sciatica.   Significant Hospital Events   4/18-4/23>> admit to [facility]-s/p stent to left distal SFA 4/27>> admit to [facility] for hypoxia from Covid/heart failure 4/27>> lower extremity Dopplers negative for DVT. [id number] grade 2 diastolic dysfunction, moderate RV systolic dysfunction.  RVSP 62.6 mmHg. 4/30>> VQ scan-low probability PE 5/5>> improved-on room air 5/8>> worsening hypoxemia-on 15 L of oxygen-with dramatic response to Lasix-down to 4 L 5/10 -5/12 once again requiring oxygen. Had been down to room air prior to this. Oxygen requirements progressed to point where she required intubation. 5/10 weight was up significantly. 106.4 to 109.7 Kg. 5/12 intubated. ABX started. Lasix escalated. LE [place] repeated and was placed on therapeutic LMWH. Post intubation film w/ small pneumomediastinum. Tube feeds started. Pulse steroids started  Consults:  Nephrology Cardiology  Procedures:  5/12 ETT >> RUE PICC 5/12>> BL chest tubes 5/13  >>  Significant Diagnostic Tests:  Lower extremity doppler 4/27 > no evidence of DVT Echo 4/29 > Grade II DD, moderate reduction of RV systolic function.  V/Q 4/30 > No evidence PE US renal 4/30 > normal size kidneys, no hydro.   Micro Data:  COVID pos 4/27  Flu neg 4/27  Blood cx neg 4/28 > 5/12 BC >> neg 5/12 resp >> GPC pairs >> nml flora 5/14 respiratory >> few Candida  Antimicrobials:  Cefepime 5/12 > 5/18 Vancomycin  5/12 >zyvox >>> 5/12 >> 514  Interim history/subjective:   Hypertension overnight, norepinephrine uptitrated and vasopressin added.  Ultrafiltration decreased on CVVH. Question of subtle ST elevations on EKG, cardiology evaluated.  Argatroban added given low platelets, pending HITT studies.  Platelets 45 Remains on pressure control ventilation, FiO2 0.80, PEEP 14 I/O +2.6 L total  Objective   Blood pressure (!) 117/36, pulse 60, temperature (!) 97.4 F (36.3 C), temperature source Oral, resp. rate (!) 31, height 5\' 3"  (1.6 m), weight 105.8 kg, last menstrual period 02/16/2017, SpO2 100 %. CVP:  [9 mmHg-18 mmHg] 18 mmHg  Vent Mode: PCV FiO2 (%):  [60 %-80 %] 80 % Set Rate:  [35 bmp] 35 bmp Vt Set:  [380 mL] 380 mL PEEP:  [14 cmH20-16 cmH20] 14 cmH20 Plateau Pressure:  [36 cmH20-45 cmH20] 40 cmH20   Intake/Output Summary (Last 24 hours) at 02/04/2020 0731 Last data filed at 02/04/2020 0700 Gross per 24 hour  Intake 3467.2 ml  Output 6083 ml  Net -2615.8 ml   Filed Weights   02/02/20 0500 02/03/20 1011 02/04/20 0500  Weight: 113.8 kg 108.2 kg 105.8 kg    Examination:  General: Obese woman, ill-appearing, intubated and sedated, paralyzed HEENT: ET tube in good position Neuro: Heavily sedated,  paralyzed CV: Distant, no murmur PULM: Coarse bilaterally, decreased at both bases.  Equal breath sounds bilaterally.  Chest tubes do not appear to be kinked, no air leak in either Pleur-evac GI: Obese, nondistended, hypoactive bowel sounds GU: No  urine Extremities: Trace pretibial edema Skin: No rash  Chest x-ray 5/19 reviewed by me.  Persistent interstitial infiltrates bilaterally.  Evident right small pneumothorax, none seen on the left.  An increase compared with 5/18.  Chest tubes in place   Resolved Hospital Problem list     Assessment & Plan:   ARDS due to COVID 19 pneumonia: positive test 4/27. S/p Remdesivir, decadron, actemra .  Interestingly oxygen requirements initially improved and then worsened>> not typical course for Covid pneumonia but no other cause identified other than progressive CHF Acute on chronic HFpEF  COPD +/- acute exacerbation OSA: non-complaint with CPAP  Plan Continue pressure control ventilation, attempt to target VT 60 cc/kg Continue neuromuscular blockade 5/19 Plan wean Solu-Medrol to off on 5/19 Volume removal as she can tolerate although blood pressure has become limiting overnight Bilateral chest tubes for pneumothorax, pneumomediastinum as below  Pneumomediastinum with bilateral pneumothoraces Continue bilateral chest tubes to suction.  Right tube may need to be adjusted as she has evidence for small recurrent pneumothorax. Follow chest x-ray for stability, repeat 5/19 afternoon.  Ensure that chest tubes are being flushed per protocol, are functioning Wean PEEP aggressively as able  Multifactorial shock, likely related to sedation versus sepsis not present on admission.  Possible contribution of cardiogenic component Bradycardia -2/2 propofol Subtle ST elevations, likely demand ischemia in the setting of multiorgan failure Plan Continue to follow CVP, goal ~10-12 Wean norepinephrine as able. Continue vasopressin Cefepime for possible superimposed H CAP completed on 5/18 Argatroban added to ASA and Plavix on 5/18 for possible ACS   Acute on CKD stage 3b, oliguric/anuric  baseline cr ~1.5 to 2.5 even dating back 2 yrs ago. Worsening BUN/Cr,  Total body volume overload Hyperkalemia,  improved Plan Appreciate nephrology management CVVHD, volume removal will be dictated by hemodynamic stability.  Goal CVP 10-12 Follow BMP, urine output  Uncontrolled DM w/ labile glycemic control Plan Continue sliding scale insulin protocol Continue Levemir increased to 20 units twice daily on 5/17 Tube feed coverage 6 units every 4 hours  Elevated D Dimer: likely due to [place]. VTE essentially ruled out dopplers and VQ scan completed on 4/30 Plan Argatroban added overnight 5/18 for possible ACS  PAD: s/p recent stent to LLE Plan Aspirin, Plavix Argatroban added as above 5/18  Thrombocytopenia with likely DIC: schistocytes present on peripheral smear Continue to follow CBC, platelets low but stable 45 now on argatroban Okay to continue aspirin and Plavix in absence of any active bleeding HIT panel pending    Best practice:  Diet: start TF Pain/Anxiety/Delirium protocol (if indicated): Propofol/fentanyl, goal RASS -5 VAP protocol (if indicated): Yes DVT prophylaxis: Subcutaneous heparin GI prophylaxis: PPI Glucose control: SSI Mobility: BR Code Status: FULL Family Communication: Updated daughter 5/18 Disposition: ICU   Independent CC time 17 minutes   Baltazar Apo, MD, PhD 02/04/2020, 7:31 AM [facility] [phone] or if no answer [phone]

## 2020-02-04 NOTE — Progress Notes (Signed)
Notified Elink about critical lab value, troponin (high sensitivity) 269.

## 2020-02-04 NOTE — Progress Notes (Signed)
    Reviewed cardiology consult note from earlier this morning.  Agree with plan.  Treating currently with argatroban given possible HIT  Platelets 45,000.  Her troponin elevation of 296 is likely evidence of demand ischemia in the setting of her ARDS.  She did however have subtle ST segment elevation changes.  This was discussed with interventional cardiology as well.  Medical management.  Candee Furbish, MD

## 2020-02-04 NOTE — Progress Notes (Signed)
  Echocardiogram 2D Echocardiogram has been performed.  [name] A Leesha Veno 02/04/2020, 1:30 PM

## 2020-02-04 NOTE — Consult Note (Signed)
Cardiology Admission History and Physical:   Patient ID: [name] MRN: [id number]; DOB: Apr 25, 1956   Admission date: 01/09/2020  Primary Care Provider: Javier Docker, MD Primary Cardiologist: No primary care provider on file.  Primary Electrophysiologist:  None   Chief Complaint:  Abnormal EKG  Patient Profile:   [name] is a 64 y.o. female admitted on 4/27 with COVID pneumonia and HF exacerbation. During admission she developed worsening, refractory hypoxic respiratory failure for which she was intubated on 5/12. She had progressed to worsening MSOF now requiring CRRT and vasoactive medications since the time of intubation.   This evening she began to develop a worsening pressor requirement for which an EKG was performed. This demonstrated new inferior ST elevations in II, III, and AVF as compared to prior with depressions in I, AVL. Cardiology was consulted by CCM.   Patient intubated and sedated; unable to obtain ROS.   Past Medical History:  Diagnosis Date  . Achilles rupture   . Anxiety   . Arthritis   . Asthma   . Chronic back pain   . Chronic diastolic heart failure ([place]) 11/30/2016  . Chronic headaches   . Chronic leg pain    bilaterally  . CHRONIC OBSTRUCTIVE PULMONARY DISEASE, MODERATE 03/26/2010   Annotation: with moderate restrictive lung disease as well per PFTs  04/02/07--actual numbers not noted in old records Qualifier: Diagnosis of  By: Amil Amen MD, Benjamine Mola    . Depression   . Diabetes mellitus 1999   T2DM  . Glaucoma   . Gout   . H/O gestational diabetes mellitus, not currently pregnant   . Hyperlipidemia   . Hypertension   . Morbid obesity ([place])   . Obstructive sleep apnea on CPAP 07/02/2009   Qualifier: Diagnosis of  By: Amil Amen MD, Benjamine Mola    . OSA (obstructive sleep apnea)   . Sciatica     Past Surgical History:  Procedure Laterality Date  . ABDOMINAL AORTOGRAM W/LOWER EXTREMITY Bilateral 01/08/2020   Procedure: ABDOMINAL  AORTOGRAM W/LOWER EXTREMITY;  Surgeon: Marty Heck, MD;  Location: [facility];  Service: Cardiovascular;  Laterality: Bilateral;  . BACK SURGERY    . BREAST EXCISIONAL BIOPSY Right   . CERVICAL SPINE SURGERY    . CESAREAN SECTION  12/10/89  . CHOLECYSTECTOMY  1991  . LUMBAR SPINE SURGERY    . PERIPHERAL VASCULAR INTERVENTION Left 01/08/2020   Procedure: PERIPHERAL VASCULAR INTERVENTION;  Surgeon: Marty Heck, MD;  Location: [facility];  Service: Cardiovascular;  Laterality: Left;  . RIGHT HEART CATH N/A 12/06/2016   Procedure: Right Heart Cath;  Surgeon: Larey Dresser, MD;  Location: [facility];  Service: Cardiovascular;  Laterality: N/A;  . TUBAL LIGATION       Medications Prior to Admission: Prior to Admission medications   Medication Sig Start Date End Date Taking? Authorizing Provider  acetaminophen (TYLENOL) 500 MG tablet Take 500-1,000 mg by mouth every 6 (six) hours as needed for mild pain or headache.    Yes [provider]  ALPHAGAN P 0.15 % ophthalmic solution Place 1 drop into both eyes 2 (two) times daily.  07/03/19  Yes [provider]  aspirin EC 81 MG tablet Take 81 mg by mouth daily with breakfast.   Yes [provider]  atorvastatin (LIPITOR) 40 MG tablet Take 1 tablet (40 mg total) by mouth daily. 01/09/20 02/08/20 Yes Donne Hazel, MD  Cholecalciferol (VITAMIN D) 50 MCG (2000 UT) tablet Take 2,000  Units by mouth daily.   Yes [provider]  clopidogrel (PLAVIX) 75 MG tablet Take 1 tablet (75 mg total) by mouth daily. 01/10/20 02/09/20 Yes Donne Hazel, MD  colchicine 0.6 MG tablet Take 0.6 mg by mouth daily.   Yes [provider]  dorzolamide-timolol (COSOPT) 22.3-6.8 MG/ML ophthalmic solution Place 1 drop into both eyes 2 (two) times daily.   Yes [provider]  doxazosin (CARDURA) 4 MG tablet Take 4 mg by mouth at bedtime.   Yes [provider]  insulin aspart  (NOVOLOG FLEXPEN) 100 UNIT/ML FlexPen Inject 3-20 Units into the skin 3 (three) times daily as needed for high blood sugar (CBG 150).   Yes [provider]  insulin glargine (LANTUS) 100 unit/mL SOPN Inject 40 Units into the skin daily before breakfast.    Yes [provider]  liraglutide (VICTOZA) 18 MG/3ML SOPN Inject 1.8 mg into the skin daily.    Yes [provider]  lisinopril (PRINIVIL,ZESTRIL) 5 MG tablet Take 5 mg by mouth daily with breakfast.  06/18/17  Yes [provider]  oxyCODONE-acetaminophen (PERCOCET/ROXICET) 5-325 MG tablet Take 1-2 tablets by mouth every 6 (six) hours as needed for moderate pain. 01/09/20  Yes Donne Hazel, MD  prednisoLONE acetate (PRED FORTE) 1 % ophthalmic suspension Place 1 drop into the right eye in the morning and at bedtime. 01/01/20  Yes [provider]  Rice into the lungs at bedtime. CPAP   Yes [provider]  torsemide (DEMADEX) 20 MG tablet Take 3 tablets (60 mg total) by mouth as directed. Take 60 mg in the AM. You may take 60 mg in the PM as needed for swelling Patient taking differently: Take 40 mg by mouth daily.  12/04/17  Yes Josue Hector, MD  Travoprost, BAK Free, (TRAVATAN) 0.004 % SOLN ophthalmic solution Place 1 drop into both eyes at bedtime.   Yes [provider]  valACYclovir (VALTREX) 1000 MG tablet Take 1,000 mg by mouth 3 (three) times daily. 09/29/19  Yes [provider]  albuterol (PROVENTIL HFA;VENTOLIN HFA) 108 (90 BASE) MCG/ACT inhaler Inhale 2 puffs into the lungs every 6 (six) hours as needed for wheezing or shortness of breath.     Robyn Haber, MD  Vitamin D, Ergocalciferol, (DRISDOL) 50000 units CAPS capsule Take 1 capsule (50,000 Units total) by mouth every 7 (seven) days. Patient not taking: Reported on 01/05/2020 09/13/17   Adora Fridge, RPH-CPP     Allergies:    Allergies  Allergen Reactions  . Heparin      Possible HIT?  Marland Kitchen Pork Allergy   . Pork-Derived Products Nausea And Vomiting    Social History:   Social History   Socioeconomic History  . Marital status: Single    Spouse name: Not on file  . Number of children: Not on file  . Years of education: Not on file  . Highest education level: Not on file  Occupational History  . Not on file  Tobacco Use  . Smoking status: Former Smoker    Types: Cigarettes    Quit date: 09/04/1998    Years since quitting: 21.4  . Smokeless tobacco: Never Used  Substance and Sexual Activity  . Alcohol use: Yes    Comment: occasional  . Drug use: No  . Sexual activity: Yes    Birth control/protection: None  Other Topics Concern  . Not on file  Social History Narrative  . Not on file  Social Determinants of Health   Financial Resource Strain:   . Difficulty of Paying Living Expenses:   Food Insecurity:   . Worried About Charity fundraiser in the Last Year:   . Arboriculturist in the Last Year:   Transportation Needs:   . Film/video editor (Medical):   Marland Kitchen Lack of Transportation (Non-Medical):   Physical Activity:   . Days of Exercise per Week:   . Minutes of Exercise per Session:   Stress:   . Feeling of Stress :   Social Connections:   . Frequency of Communication with Friends and Family:   . Frequency of Social Gatherings with Friends and Family:   . Attends Religious Services:   . Active Member of Clubs or Organizations:   . Attends Archivist Meetings:   Marland Kitchen Marital Status:   Intimate Partner Violence:   . Fear of Current or Ex-Partner:   . Emotionally Abused:   Marland Kitchen Physically Abused:   . Sexually Abused:     Family History:   The patient's family history includes Heart failure in her mother; Renal Disease in her mother.    ROS:  Unable to obtain   Physical Exam/Data:   Vitals:   02/04/20 0205 02/04/20 0210 02/04/20 0300 02/04/20 0323  BP:      Pulse: 71     Resp: (!) 35 (!) 35 (!) 35   Temp:    97.9 F  (36.6 C)  TempSrc:    Axillary  SpO2: 94%     Weight:      Height:        Intake/Output Summary (Last 24 hours) at 02/04/2020 0343 Last data filed at 02/04/2020 0300 Gross per 24 hour  Intake 3197.79 ml  Output 6451 ml  Net -3253.21 ml   Last 3 Weights 02/03/2020 02/02/2020 01/31/2020  Weight (lbs) 238 lb 8.6 oz 250 lb 14.1 oz 239 lb 3.2 oz  Weight (kg) 108.2 kg 113.8 kg 108.5 kg     Body mass index is 42.26 kg/m.   EKG:  The ECG that was done was personally reviewed and demonstrates inferior STE with depressions in I, AVL.   Relevant CV Studies: IMPRESSIONS  1. Left ventricular diastolic parameters are consistent with Grade II  diastolic dysfunction (pseudonormalization).  2. Right ventricular systolic function is moderately reduced. There is  severely elevated pulmonary artery systolic pressure.  3. The inferior vena cava is normal in size with greater than 50%  respiratory variability, suggesting right atrial pressure of 3 mmHg.   FINDINGS  Left Ventricle: Indeterminate filling pressures.   Right Ventricle: Right ventricular systolic function is moderately  reduced. There is severely elevated pulmonary artery systolic pressure.  The tricuspid regurgitant velocity is 3.86 m/s, and with an assumed right  atrial pressure of 3 mmHg, the estimated  right ventricular systolic pressure is [id number].   Tricuspid Valve: Tricuspid valve regurgitation is mild.   Venous: The inferior vena cava is normal in size with greater than 50%  respiratory variability, suggesting right atrial pressure of 3 mmHg.   Laboratory Data:  High Sensitivity Troponin:   Recent Labs  Lab 01/13/20 0002 01/13/20 0247  TROPONINIHS 49* 50*      Chemistry Recent Labs  Lab 02/03/20 0423 02/03/20 0423 02/03/20 1541 02/03/20 1556  NA 136   < > 133* 137  K 3.7   < > 5.1 5.0  CL 99  --   --  103  CO2 26  --   --  25  GLUCOSE 218*  --   --  169*  BUN 43*  --   --  34*  CREATININE 1.78*  --    --  1.44*  CALCIUM 8.3*  --   --  8.1*  GFRNONAA 30*  --   --  39*  GFRAA 35*  --   --  45*  ANIONGAP 11  --   --  9   < > = values in this interval not displayed.    Recent Labs  Lab 01/30/20 0322 01/30/20 0322 01/31/20 0500 02/01/20 0557 02/03/20 0423 02/03/20 1556  PROT 5.0*  --  4.9*  --   --   --   ALBUMIN 2.4*   < > 2.3*   < > 2.3* 2.4*  AST 35  --  39  --   --   --   ALT 62*  --  60*  --   --   --   ALKPHOS 85  --  86  --   --   --   BILITOT 1.1  --  0.9  --   --   --    < > = values in this interval not displayed.   Hematology Recent Labs  Lab 02/03/20 0423 02/03/20 0423 02/03/20 1151 02/03/20 1541 02/04/20 0037  WBC 15.2*  --   --   --  15.3*  RBC 2.93*  --   --   --  3.07*  HGB 9.4*   < >  --  10.9* 9.9*  HCT 30.9*   < >  --  32.0* 32.9*  MCV 105.5*  --   --   --  107.2*  MCH 32.1  --   --   --  32.2  MCHC 30.4  --   --   --  30.1  RDW 22.0*  --   --   --  22.6*  PLT 43*   < > PLATELET CLUMPS NOTED ON SMEAR, COUNT APPEARS DECREASED  --  49*   < > = values in this interval not displayed.   BNP Recent Labs  Lab 01/30/20 0322 01/31/20 0500 01/31/20 1215  BNP 111.8* 147.6* 146.7*    DDimer  Recent Labs  Lab 02/03/20 1151  DDIMER 2.46*     Radiology/Studies:  DG Chest Port 1 View  Result Date: 02/03/2020 CLINICAL DATA:  COVID pneumonia. EXAM: PORTABLE CHEST 1 VIEW COMPARISON:  Multiple previous chest films. The most recent is yesterday. FINDINGS: The endotracheal tube, feeding tube, right IJ catheter, right PICC line and bilateral chest tubes are stable. Persistent diffuse interstitial and airspace infiltrates, consistent with COVID pneumonia. No obvious pneumomediastinum or pneumopericardium. IMPRESSION: 1. Stable support apparatus. 2. Persistent diffuse interstitial and airspace infiltrates consistent with COVID pneumonia. 3. Bilateral chest tubes without pneumothorax. Electronically Signed   By: Marijo Sanes M.D.   On: 02/03/2020 08:01      Assessment and Plan:   Ms. Yoffe is a 63 year old woman with ARDS due to COVID PNA, HFpEF, MSOF and shock, and PAD on ASA/plavix who developed increasing pressor requirements earlier today and was found to have subtle inferior ST elevations on repeat EKG (last done 5/14). In the context of her refractory hypoxemic respiratory failure and MSOF as well as thrombocytopenia she is not a candidate for coronary angiography and/or intervention at this time. Discussed with Dr. Gwenlyn Found (STEMI on call). Recommend medical therapy. She is already on DAPT with aspirin and plavix. Heparin had been held due to  worsening thrombocytopenia and HIT panel is pending. Would recommend sending citrated tube of platelets to get accurate platelet count given clumping to determine safety of AC. Agree with argatroban if platelet count amenable. Would trend hsTn and order repeat complete TTE to reassess LV wall motion and function. Would also continue DAPT as as long as platelet count not prohibitive and no bleeding risks noted. Please order daily EKG.   Cardiology to continue to follow.   For questions or updates, please contact [facility] Please consult [email or web address] for contact info under   Signed, Milus Banister, MD  02/04/2020 3:43 AM

## 2020-02-05 ENCOUNTER — Inpatient Hospital Stay (HOSPITAL_COMMUNITY): Payer: BC Managed Care – PPO

## 2020-02-05 LAB — GLUCOSE, CAPILLARY
Glucose-Capillary: 117 mg/dL — ABNORMAL HIGH (ref 70–99)
Glucose-Capillary: 119 mg/dL — ABNORMAL HIGH (ref 70–99)
Glucose-Capillary: 130 mg/dL — ABNORMAL HIGH (ref 70–99)
Glucose-Capillary: 138 mg/dL — ABNORMAL HIGH (ref 70–99)
Glucose-Capillary: 157 mg/dL — ABNORMAL HIGH (ref 70–99)
Glucose-Capillary: 158 mg/dL — ABNORMAL HIGH (ref 70–99)
Glucose-Capillary: 172 mg/dL — ABNORMAL HIGH (ref 70–99)
Glucose-Capillary: 185 mg/dL — ABNORMAL HIGH (ref 70–99)
Glucose-Capillary: 190 mg/dL — ABNORMAL HIGH (ref 70–99)
Glucose-Capillary: 192 mg/dL — ABNORMAL HIGH (ref 70–99)
Glucose-Capillary: 222 mg/dL — ABNORMAL HIGH (ref 70–99)
Glucose-Capillary: 260 mg/dL — ABNORMAL HIGH (ref 70–99)
Glucose-Capillary: 283 mg/dL — ABNORMAL HIGH (ref 70–99)
Glucose-Capillary: 294 mg/dL — ABNORMAL HIGH (ref 70–99)
Glucose-Capillary: 295 mg/dL — ABNORMAL HIGH (ref 70–99)

## 2020-02-05 LAB — COMPREHENSIVE METABOLIC PANEL
ALT: 52 U/L — ABNORMAL HIGH (ref 0–44)
AST: 35 U/L (ref 15–41)
Albumin: 2.4 g/dL — ABNORMAL LOW (ref 3.5–5.0)
Alkaline Phosphatase: 111 U/L (ref 38–126)
Anion gap: 10 (ref 5–15)
BUN: 36 mg/dL — ABNORMAL HIGH (ref 8–23)
CO2: 24 mmol/L (ref 22–32)
Calcium: 8.3 mg/dL — ABNORMAL LOW (ref 8.9–10.3)
Chloride: 102 mmol/L (ref 98–111)
Creatinine, Ser: 1.2 mg/dL — ABNORMAL HIGH (ref 0.44–1.00)
GFR calc Af Amer: 56 mL/min — ABNORMAL LOW (ref >60)
GFR calc non Af Amer: 48 mL/min — ABNORMAL LOW (ref >60)
Glucose, Bld: 321 mg/dL — ABNORMAL HIGH (ref 70–99)
Potassium: 4.3 mmol/L (ref 3.5–5.1)
Sodium: 136 mmol/L (ref 135–145)
Total Bilirubin: 0.5 mg/dL (ref 0.3–1.2)
Total Protein: 5.6 g/dL — ABNORMAL LOW (ref 6.5–8.1)

## 2020-02-05 LAB — CBC
HCT: 30.7 % — ABNORMAL LOW (ref 36.0–46.0)
Hemoglobin: 9.2 g/dL — ABNORMAL LOW (ref 12.0–15.0)
MCH: 32.6 pg (ref 26.0–34.0)
MCHC: 30 g/dL (ref 30.0–36.0)
MCV: 108.9 fL — ABNORMAL HIGH (ref 80.0–100.0)
Platelets: 35 10*3/uL — ABNORMAL LOW (ref 150–400)
RBC: 2.82 MIL/uL — ABNORMAL LOW (ref 3.87–5.11)
RDW: 22.2 % — ABNORMAL HIGH (ref 11.5–15.5)
WBC: 16.5 10*3/uL — ABNORMAL HIGH (ref 4.0–10.5)
nRBC: 24.8 % — ABNORMAL HIGH (ref 0.0–0.2)

## 2020-02-05 LAB — POCT I-STAT 7, (LYTES, BLD GAS, ICA,H+H)
Acid-base deficit: 4 mmol/L — ABNORMAL HIGH (ref 0.0–2.0)
Acid-base deficit: 3 mmol/L — ABNORMAL HIGH (ref 0.0–2.0)
Acid-base deficit: 5 mmol/L — ABNORMAL HIGH (ref 0.0–2.0)
Bicarbonate: 24.6 mmol/L (ref 20.0–28.0)
Bicarbonate: 26.5 mmol/L (ref 20.0–28.0)
Bicarbonate: 24.3 mmol/L (ref 20.0–28.0)
Calcium, Ion: 1.24 mmol/L (ref 1.15–1.40)
Calcium, Ion: 1.27 mmol/L (ref 1.15–1.40)
Calcium, Ion: 1.19 mmol/L (ref 1.15–1.40)
HCT: 30 % — ABNORMAL LOW (ref 36.0–46.0)
HCT: 29 % — ABNORMAL LOW (ref 36.0–46.0)
HCT: 30 % — ABNORMAL LOW (ref 36.0–46.0)
Hemoglobin: 10.2 g/dL — ABNORMAL LOW (ref 12.0–15.0)
Hemoglobin: 9.9 g/dL — ABNORMAL LOW (ref 12.0–15.0)
Hemoglobin: 10.2 g/dL — ABNORMAL LOW (ref 12.0–15.0)
O2 Saturation: 81 %
O2 Saturation: 91 %
O2 Saturation: 72 %
Patient temperature: 97.7
Patient temperature: 97
Patient temperature: 96.5
Potassium: 4.6 mmol/L (ref 3.5–5.1)
Potassium: 4.2 mmol/L (ref 3.5–5.1)
Potassium: 3.9 mmol/L (ref 3.5–5.1)
Sodium: 133 mmol/L — ABNORMAL LOW (ref 135–145)
Sodium: 133 mmol/L — ABNORMAL LOW (ref 135–145)
Sodium: 135 mmol/L (ref 135–145)
TCO2: 26 mmol/L (ref 22–32)
TCO2: 29 mmol/L (ref 22–32)
TCO2: 26 mmol/L (ref 22–32)
pCO2 arterial: 59.7 mmHg — ABNORMAL HIGH (ref 32.0–48.0)
pCO2 arterial: 68.5 mmHg (ref 32.0–48.0)
pCO2 arterial: 65.7 mmHg (ref 32.0–48.0)
pH, Arterial: 7.221 — ABNORMAL LOW (ref 7.350–7.450)
pH, Arterial: 7.19 — CL (ref 7.350–7.450)
pH, Arterial: 7.169 — CL (ref 7.350–7.450)
pO2, Arterial: 54 mmHg — ABNORMAL LOW (ref 83.0–108.0)
pO2, Arterial: 73 mmHg — ABNORMAL LOW (ref 83.0–108.0)
pO2, Arterial: 46 mmHg — ABNORMAL LOW (ref 83.0–108.0)

## 2020-02-05 LAB — RENAL FUNCTION PANEL
Albumin: 2.3 g/dL — ABNORMAL LOW (ref 3.5–5.0)
Anion gap: 8 (ref 5–15)
BUN: 32 mg/dL — ABNORMAL HIGH (ref 8–23)
CO2: 26 mmol/L (ref 22–32)
Calcium: 8.2 mg/dL — ABNORMAL LOW (ref 8.9–10.3)
Chloride: 102 mmol/L (ref 98–111)
Creatinine, Ser: 1.03 mg/dL — ABNORMAL HIGH (ref 0.44–1.00)
GFR calc Af Amer: >60 mL/min (ref >60)
GFR calc non Af Amer: 58 mL/min — ABNORMAL LOW (ref >60)
Glucose, Bld: 213 mg/dL — ABNORMAL HIGH (ref 70–99)
Phosphorus: 3.1 mg/dL (ref 2.5–4.6)
Potassium: 4.4 mmol/L (ref 3.5–5.1)
Sodium: 136 mmol/L (ref 135–145)

## 2020-02-05 LAB — PROTIME-INR
INR: 4.3 (ref 0.8–1.2)
Prothrombin Time: 40 seconds — ABNORMAL HIGH (ref 11.4–15.2)

## 2020-02-05 LAB — APTT: aPTT: 80 seconds — ABNORMAL HIGH (ref 24–36)

## 2020-02-05 LAB — PATHOLOGIST SMEAR REVIEW

## 2020-02-05 LAB — HEPARIN INDUCED PLATELET AB (HIT ANTIBODY): Heparin Induced Plt Ab: 0.239 OD (ref 0.000–0.400)

## 2020-02-05 MED ORDER — STERILE WATER FOR INJECTION IV SOLN
INTRAVENOUS | Status: DC
  Filled 2020-02-05 (×4): qty 850

## 2020-02-05 MED ORDER — DEXTROSE 50 % IV SOLN: 0.0000 mL | INTRAVENOUS | Status: DC | PRN

## 2020-02-05 MED ORDER — SODIUM CHLORIDE 0.9 % IV SOLN
2.0000 g | Freq: Two times a day (BID) | INTRAVENOUS | Status: DC
  Administered 2020-02-06: 2 g via INTRAVENOUS
  Filled 2020-02-05: qty 2

## 2020-02-05 MED ORDER — INSULIN REGULAR(HUMAN) IN NACL 100-0.9 UT/100ML-% IV SOLN
INTRAVENOUS | Status: DC
Start: 2020-02-05 — End: 2020-02-05

## 2020-02-05 MED ORDER — DOPAMINE-DEXTROSE 3.2-5 MG/ML-% IV SOLN
0.0000 ug/kg/min | INTRAVENOUS | Status: DC
  Administered 2020-02-05: 5 ug/kg/min via INTRAVENOUS
  Filled 2020-02-05 (×2): qty 250

## 2020-02-05 MED ORDER — DEXTROSE 10 % IV SOLN: INTRAVENOUS | Status: DC | PRN

## 2020-02-05 MED ORDER — B COMPLEX-C PO TABS
1.0000 | ORAL_TABLET | Freq: Every day | ORAL | Status: DC
  Administered 2020-02-05: 1
  Filled 2020-02-05: qty 1

## 2020-02-05 MED ORDER — VITAL 1.5 CAL PO LIQD
1000.0000 mL | ORAL | Status: DC
  Administered 2020-02-06: 1000 mL
  Filled 2020-02-05 (×3): qty 1000

## 2020-02-05 MED ORDER — VANCOMYCIN HCL 1250 MG/250ML IV SOLN: 1250.0000 mg | INTRAVENOUS | Status: DC

## 2020-02-05 MED ORDER — LACTATED RINGERS IV BOLUS
500.0000 mL | Freq: Once | INTRAVENOUS | Status: AC
  Administered 2020-02-05: 500 mL via INTRAVENOUS

## 2020-02-05 MED ORDER — PRO-STAT SUGAR FREE PO LIQD
30.0000 mL | Freq: Two times a day (BID) | ORAL | Status: DC
  Administered 2020-02-05: 30 mL
  Filled 2020-02-05: qty 30

## 2020-02-05 MED ORDER — INSULIN REGULAR(HUMAN) IN NACL 100-0.9 UT/100ML-% IV SOLN
INTRAVENOUS | Status: DC
  Administered 2020-02-05: 7.5 [IU]/h via INTRAVENOUS
  Administered 2020-02-05: 17 [IU]/h via INTRAVENOUS
  Filled 2020-02-05 (×2): qty 100

## 2020-02-05 MED ORDER — INSULIN ASPART 100 UNIT/ML ~~LOC~~ SOLN
6.0000 [IU] | SUBCUTANEOUS | Status: DC
  Administered 2020-02-06 (×2): 6 [IU] via SUBCUTANEOUS

## 2020-02-05 MED ORDER — DEXTROSE 50 % IV SOLN
0.0000 mL | INTRAVENOUS | Status: DC | PRN
Start: 2020-02-05 — End: 2020-02-05

## 2020-02-05 MED ORDER — SODIUM BICARBONATE 8.4 % IV SOLN
100.0000 meq | Freq: Once | INTRAVENOUS | Status: AC
  Administered 2020-02-05: 100 meq via INTRAVENOUS

## 2020-02-05 MED ORDER — VANCOMYCIN HCL 2000 MG/400ML IV SOLN
2000.0000 mg | Freq: Once | INTRAVENOUS | Status: AC
  Administered 2020-02-06: 2000 mg via INTRAVENOUS
  Filled 2020-02-05: qty 400

## 2020-02-05 MED ORDER — SODIUM CHLORIDE 0.9 % IV SOLN: 1.0000 g | Freq: Two times a day (BID) | INTRAVENOUS | Status: DC

## 2020-02-05 MED ORDER — VANCOMYCIN HCL 1250 MG/250ML IV SOLN
1250.0000 mg | INTRAVENOUS | Status: DC
  Filled 2020-02-05: qty 250

## 2020-02-05 MED ORDER — INSULIN DETEMIR 100 UNIT/ML ~~LOC~~ SOLN
18.0000 [IU] | Freq: Two times a day (BID) | SUBCUTANEOUS | Status: DC
  Administered 2020-02-05: 18 [IU] via SUBCUTANEOUS
  Filled 2020-02-05 (×3): qty 0.18

## 2020-02-05 MED ORDER — SODIUM BICARBONATE 8.4 % IV SOLN
INTRAVENOUS | Status: AC
  Filled 2020-02-05: qty 100

## 2020-02-05 MED ORDER — INSULIN ASPART 100 UNIT/ML ~~LOC~~ SOLN
3.0000 [IU] | SUBCUTANEOUS | Status: DC
  Administered 2020-02-06: 3 [IU] via SUBCUTANEOUS

## 2020-02-05 NOTE — Progress Notes (Signed)
Assisted tele visit to patient with provider Dr Marlou Porch.  Dwon Sky, Aliene Beams, RN

## 2020-02-05 NOTE — Progress Notes (Signed)
Inpatient Diabetes Program Recommendations  AACE/ADA: New Consensus Statement on Inpatient Glycemic Control (2015)  Target Ranges:  Prepandial:   less than 140 mg/dL      Peak postprandial:   less than 180 mg/dL (1-2 hours)      Critically ill patients:  140 - 180 mg/dL   Lab Results  Component Value Date   GLUCAP 294 (H) 02/05/2020   HGBA1C 12.2 (H) 01/05/2020    Review of Glycemic Control Results for [name], [name] (MRN [id number]) as of 02/05/2020 09:41  Ref. Range 02/04/2020 15:21 02/04/2020 19:20 02/04/2020 23:12 02/05/2020 03:13 02/05/2020 07:09  Glucose-Capillary Latest Ref Range: 70 - 99 mg/dL 263 (H) 289 (H) 296 (H) 295 (H) 294 (H)  Diabetes history:DM 2 Outpatient Diabetes medications:Lantus 50 units qam, Novolog 3-20 units tid with meals for glucose > 150 mg/dl Current orders for Inpatient glycemic control: Levemir 20 units bid, Novolog 6 units q 4 hours Novolog resistant q 4 hours Vital 1.5 Cal @ 50 ml/hr Inpatient Diabetes Program Recommendations:    Consider IV insulin since blood sugars>300 mg/dL.    Thanks,  Adah Perl, RN, BC-ADM Inpatient Diabetes Coordinator Pager [phone] (8a-5p)

## 2020-02-05 NOTE — Progress Notes (Signed)
Nutrition Follow-up  DOCUMENTATION CODES:   Obesity unspecified  INTERVENTION:   Tube Feeding via Cortrak:  Vital 1.5 at 65 ml/hr Pro-Stat 30 mL BID Provides 135 g of protein, 2540 kcals, 1186 mL of free water Meets 100% estimated calorie and protein needs  Add B complex with C  NUTRITION DIAGNOSIS:   Inadequate oral intake related to acute illness as evidenced by NPO status.  Being addressed via TF   GOAL:   Patient will meet greater than or equal to 90% of their needs  Progressing   MONITOR:   Vent status, Labs, Weight trends, TF tolerance  REASON FOR ASSESSMENT:   Consult, Ventilator Enteral/tube feeding initiation and management  ASSESSMENT:   64 yo female admitted with acute progressive respiratory failure with COVID-19 pneumonia which ultimately required intubation after 2 week LOS, acute on chronic CHF. PMH includes CHF, DM, depression, HLD, HTN, CKD, COPD  4/26 Admitted 5/12 Intubated, Cortrak (gastric) 5/16 CRRT initiated  Pt remains on vent support, paralyzed on nimbex gtt, sedated with fentanyl and versed gtt, remains on levophed gtt. Off vasopressin  Tolerating Vital 1.5 at 50 ml/hr, Pro-Stat 30 mL QID via Cortrak  CBGs elevated, starting insulin drip today  Labs: CBGs 294-296, sodium 133 (L), potassium wdl, phosphorus wdl Meds: insulin drip, Vit C, zinc sulfate   Diet Order:   Diet Order    None      EDUCATION NEEDS:   Not appropriate for education at this time  Skin:  Skin Assessment: Skin Integrity Issues: Skin Integrity Issues:: Stage II Stage II: buttock, mid lower back  Last BM:  5/20 rectal tube  Height:   Ht Readings from Last 1 Encounters:  01/28/20 5\' 3"  (1.6 m)    Weight:   Wt Readings from Last 1 Encounters:  02/05/20 105 kg    Ideal Body Weight:     BMI:  Body mass index is 41.01 kg/m.  Estimated Nutritional Needs:   Kcal:  [id number] kcals  Protein:  125-150 g  Fluid:  >/= 2 L    Kerman Passey MS,  RDN, LDN, CNSC RD Pager Number and RD On-Call Pager Number Located in [place]

## 2020-02-05 NOTE — Progress Notes (Addendum)
Progress Note  Due to the COVID-19 pandemic, this visit was completed with telemedicine (audio/video) technology to reduce patient and provider exposure as well as to preserve personal protective equipment.   Patient Name: [name] Date of Encounter: 02/05/2020  Primary Cardiologist: No primary care provider on file.   Subjective   Sedate in bed, obtunded, ventilator  Inpatient Medications    Scheduled Meds: . artificial tears  1 application Both Eyes Y2Q  . vitamin C  500 mg Per Tube Daily  . aspirin  81 mg Per Tube Daily  . atorvastatin  40 mg Per Tube Daily  . brimonidine  1 drop Both Eyes BID  . chlorhexidine gluconate (MEDLINE KIT)  15 mL Mouth Rinse BID  . Chlorhexidine Gluconate Cloth  6 each Topical Daily  . clopidogrel  75 mg Per Tube Daily  . dorzolamide-timolol  1 drop Both Eyes BID  . feeding supplement (PRO-STAT SUGAR FREE 64)  30 mL Per Tube QID  . fentaNYL (SUBLIMAZE) injection  50 mcg Intravenous Once  . insulin aspart  0-20 Units Subcutaneous Q4H  . insulin aspart  6 Units Subcutaneous Q4H  . insulin detemir  20 Units Subcutaneous BID  . latanoprost  1 drop Both Eyes QHS  . mouth rinse  15 mL Mouth Rinse 10 times per day  . pantoprazole sodium  40 mg Per Tube Daily  . prednisoLONE acetate  1 drop Right Eye BID  . sodium chloride flush  10 mL Intracatheter Q8H  . sodium chloride flush  3 mL Intravenous Q12H  . zinc sulfate  220 mg Per Tube Daily   Continuous Infusions: . sodium chloride Stopped (01/29/20 1107)  . sodium chloride    . sodium chloride Stopped (02/03/20 2322)  . argatroban 2 mcg/kg/min (02/05/20 0800)  . cisatracurium (NIMBEX) infusion 2 mcg/kg/min (02/05/20 0800)  . DOPamine    . feeding supplement (VITAL 1.5 CAL) 50 mL/hr at 02/05/20 0600  . fentaNYL infusion INTRAVENOUS 150 mcg/hr (02/05/20 0800)  . midazolam 2 mg/hr (02/05/20 0800)  . norepinephrine (LEVOPHED) Adult infusion 2 mcg/min (02/05/20 0800)  . prismasol BGK 2/2.5  dialysis solution 1,500 mL/hr at 02/05/20 0149  . prismasol BGK 2/2.5 replacement solution 500 mL/hr at 02/05/20 8250  . prismasol BGK 2/2.5 replacement solution 300 mL/hr at 02/04/20 1341  .  sodium bicarbonate (isotonic) infusion in sterile water    . vasopressin (PITRESSIN) infusion - *FOR SHOCK* Stopped (02/04/20 1904)   PRN Meds: Place/Maintain arterial line **AND** sodium chloride, sodium chloride, acetaminophen, fentaNYL, heparin, ipratropium-albuterol, midazolam, [DISCONTINUED] ondansetron **OR** ondansetron (ZOFRAN) IV, sodium chloride, sodium chloride flush, sodium chloride flush, sterile water (preservative free)   Vital Signs    Vitals:   02/05/20 0730 02/05/20 0745 02/05/20 0800 02/05/20 0815  BP:  130/76    Pulse: (!) 53 (!) 54 (!) 51 (!) 50  Resp: (!) 35 (!) 36    Temp:      TempSrc:      SpO2: 92% 92% 92% 94%  Weight:      Height:        Intake/Output Summary (Last 24 hours) at 02/05/2020 0832 Last data filed at 02/05/2020 0800 Gross per 24 hour  Intake 2746.84 ml  Output 3595 ml  Net -848.16 ml   Last 3 Weights 02/05/2020 02/04/2020 02/03/2020  Weight (lbs) 231 lb 7.7 oz 233 lb 4 oz 238 lb 8.6 oz  Weight (kg) 105 kg 105.8 kg 108.2 kg      Telemetry  Sinus bradycardia, no evidence of AV block- Personally Reviewed  ECG    Sinus rhythm, subtle ST segment elevations noted 3 and aVF, subtle depression noted in lateral precordial leads- Personally Reviewed  Physical Exam   VITAL SIGNS:  reviewed  On vent  Labs    Chemistry Recent Labs  Lab 01/30/20 0322 01/30/20 0428 01/31/20 0500 01/31/20 1653 02/04/20 0437 02/04/20 0437 02/04/20 1521 02/04/20 1521 02/05/20 0353 02/05/20 0434 02/05/20 0641  NA 136   < > 139   < > 136   < > 137   < > 133* 136 133*  K 5.8*   < > 5.0   < > 5.8*   < > 5.3*   < > 4.6 4.3 4.2  CL 100   < > 101   < > 102  --  101  --   --  102  --   CO2 26   < > 27   < > 23  --  25  --   --  24  --   GLUCOSE 466*   < > 176*   < >  276*  --  299*  --   --  321*  --   BUN 66*   < > 82*   < > 36*  --  34*  --   --  36*  --   CREATININE 3.71*   < > 4.79*   < > 1.47*  --  1.36*  --   --  1.20*  --   CALCIUM 7.7*   < > 7.7*   < > 8.2*  --  8.3*  --   --  8.3*  --   PROT 5.0*  --  4.9*  --   --   --   --   --   --  5.6*  --   ALBUMIN 2.4*   < > 2.3*   < > 2.3*  --  2.4*  --   --  2.4*  --   AST 35  --  39  --   --   --   --   --   --  35  --   ALT 62*  --  60*  --   --   --   --   --   --  52*  --   ALKPHOS 85  --  86  --   --   --   --   --   --  111  --   BILITOT 1.1  --  0.9  --   --   --   --   --   --  0.5  --   GFRNONAA 12*   < > 9*   < > 38*  --  41*  --   --  48*  --   GFRAA 14*   < > 10*   < > 44*  --  48*  --   --  56*  --   ANIONGAP 10   < > 11   < > 11  --  11  --   --  10  --    < > = values in this interval not displayed.     Hematology Recent Labs  Lab 02/04/20 0037 02/04/20 0417 02/04/20 0437 02/04/20 0437 02/05/20 0353 02/05/20 0434 02/05/20 0641  WBC 15.3*  --  15.0*  --   --  16.5*  --   RBC 3.07*  --  2.97*  --   --  2.82*  --   HGB 9.9*   < > 9.5*   < > 10.2* 9.2* 9.9*  HCT 32.9*   < > 32.0*   < > 30.0* 30.7* 29.0*  MCV 107.2*  --  107.7*  --   --  108.9*  --   MCH 32.2  --  32.0  --   --  32.6  --   MCHC 30.1  --  29.7*  --   --  30.0  --   RDW 22.6*  --  22.3*  --   --  22.2*  --   PLT 49*  --  45*  --   --  35*  --    < > = values in this interval not displayed.    Cardiac EnzymesNo results for input(s): TROPONINI in the last 168 hours. No results for input(s): TROPIPOC in the last 168 hours.   BNP Recent Labs  Lab 01/30/20 0322 01/31/20 0500 01/31/20 1215  BNP 111.8* 147.6* 146.7*     DDimer  Recent Labs  Lab 01/30/20 0322 01/31/20 0500 02/03/20 1151  DDIMER 2.55* 1.95* 2.46*     Radiology    DG Chest Port 1 View  Result Date: 02/05/2020 CLINICAL DATA:  ARDS. EXAM: PORTABLE CHEST 1 VIEW COMPARISON:  Multiple previous chest films. The most recent is from yesterday.  FINDINGS: The endotracheal tube, feeding tube, right IJ catheter, right PICC line and bilateral chest tubes are stable. Persistent diffuse interstitial and airspace process in the lungs. No large pleural effusions. Small residual right pneumothorax is noted. IMPRESSION: 1. Stable support apparatus. 2. Persistent diffuse interstitial and airspace process. 3. Small residual right pneumothorax. Electronically Signed   By: Marijo Sanes M.D.   On: 02/05/2020 06:36   DG CHEST PORT 1 VIEW  Result Date: 02/04/2020 CLINICAL DATA:  Recent pneumothorax.  Hypoxia.  COVID-19 positive EXAM: PORTABLE CHEST 1 VIEW COMPARISON:  Feb 04, 2020 study obtained earlier in the day FINDINGS: Endotracheal tube tip is 3.6 cm above the carina. Feeding tube tip is below the diaphragm. Right peripherally inserted central catheter tip is in the superior vena cava. Right jugular catheter tip is in the superior vena cava. There is a chest tube on each side. The previously noted pneumothorax on the right is no longer evident. No pneumothorax seen on the left. Patchy mixed interstitial and airspace opacity remain bilaterally without change. Heart is borderline prominent with pulmonary vascularity normal. No adenopathy. No bone lesions. Postoperative changes noted in the lower cervical region. IMPRESSION: Tube and catheter positions as described. Currently no pneumothorax appreciable. Patchy interstitial and alveolar opacity bilaterally, likely due to multifocal pneumonia, potentially with a degree of superimposed ARDS and/or edema. More than one of these entities may be present concurrently. Stable cardiac prominence. No adenopathy evident. Electronically Signed   By: Lowella Grip III M.D.   On: 02/04/2020 14:03   DG Chest Port 1 View  Addendum Date: 02/04/2020   ADDENDUM REPORT: 02/04/2020 09:35 ADDENDUM: Critical Value/emergent results were called by telephone at the time of interpretation on 02/04/2020 at 7:50 a.m. to provider Baltazar Apo , who verbally acknowledged these results. Electronically Signed   By: Marin Olp M.D.   On: 02/04/2020 09:35   Result Date: 02/04/2020 CLINICAL DATA:  ARDS. EXAM: PORTABLE CHEST 1 VIEW COMPARISON:  02/03/2020 FINDINGS: Endotracheal tube has tip 2.9 cm above the carina. Enteric tube courses into the region of the stomach and off the  film as tip is not visualized. Right IJ central venous catheter unchanged with tip over the SVC. Right-sided PICC line unchanged. Bilateral chest tubes unchanged. Lungs are adequately inflated and demonstrate a new small right-sided pneumothorax. There is persistent mixed interstitial airspace density over the central lungs which may be due to mild edema versus infection. No definite effusion. Cardiomediastinal silhouette and remainder of the exam is unchanged. IMPRESSION: 1.  New small right-sided pneumothorax. 2. Stable bilateral mixed interstitial airspace density which may be due to edema versus infection. Stable cardiomegaly. 3.  Tubes and lines as described. Electronically Signed: By: Marin Olp M.D. On: 02/04/2020 07:47   ECHOCARDIOGRAM LIMITED  Result Date: 02/04/2020    ECHOCARDIOGRAM LIMITED REPORT   Patient Name:   [name] Date of Exam: 02/04/2020 Medical Rec #:  [id number]     Height:       63.0 in Accession #:    [id number]    Weight:       233.2 lb Date of Birth:  11-17-1955      BSA:          2.064 m Patient Age:    64 years      BP:           144/42 mmHg Patient Gender: F             HR:           52 bpm. Exam Location:  Inpatient Procedure: 2D Echo Indications:    Acute Cornonary Syndrome I24.9  History:        Patient has prior history of Echocardiogram examinations, most                 recent 01/15/2020. COPD; Risk Factors:Sleep Apnea, Hypertension,                 Morbid obesity, Dyslipidemia and Former Smoker. CKD                 Acute respiratory failure.  Sonographer:    Vikki Ports Turrentine Referring Phys: [id number] Frederik Pear  Sonographer  Comments: Covid 19 positive IMPRESSIONS  1. Poorly visualized no definity on vent no obviosu RWMA's . Left ventricular ejection fraction, by estimation, is 55 to 60%. The left ventricle has normal function. Left ventricular endocardial border not optimally defined to evaluate regional wall motion. Left ventricular diastolic parameters are indeterminate.  2. Right ventricular systolic function was not well visualized. The right ventricular size is not well visualized.  3. Mild mitral valve regurgitation.  4. Not well seen small systolic gradient across valve consider repeat study when patient off vent . The aortic valve was not well visualized. FINDINGS  Left Ventricle: Poorly visualized no definity on vent no obviosu RWMA's. Left ventricular ejection fraction, by estimation, is 55 to 60%. The left ventricle has normal function. Left ventricular endocardial border not optimally defined to evaluate regional wall motion. Right Ventricle: The right ventricular size is not well visualized. Right vetricular wall thickness was not assessed. Right ventricular systolic function was not well visualized. Left Atrium: Left atrial size was normal in size. Right Atrium: Right atrial size was not well visualized. Pericardium: There is no evidence of pericardial effusion. Mitral Valve: There is moderate thickening of the mitral valve leaflet(s). There is moderate calcification of the mitral valve leaflet(s). Mild mitral annular calcification. Mild mitral valve regurgitation. Tricuspid Valve: The tricuspid valve is not well visualized. Tricuspid valve regurgitation is mild. Aortic Valve: Not well seen small systolic  gradient across valve consider repeat study when patient off vent. The aortic valve was not well visualized. . There is mild thickening and mild calcification of the aortic valve. There is mild thickening of the  aortic valve. There is mild calcification of the aortic valve. Aortic valve mean gradient measures 12.0 mmHg.  Aortic valve peak gradient measures 22.7 mmHg. Pulmonic Valve: The pulmonic valve was not well visualized. Aorta: The aortic root was not well visualized. IAS/Shunts: The interatrial septum was not well visualized.   Diastology LV e' lateral:   5.11 cm/s LV E/e' lateral: 11.3  AORTIC VALVE AV Vmax:           238.00 cm/s AV Vmean:          160.000 cm/s AV VTI:            0.380 m AV Peak Grad:      22.7 mmHg AV Mean Grad:      12.0 mmHg LVOT Vmax:         207.00 cm/s LVOT Vmean:        150.000 cm/s LVOT VTI:          0.316 m LVOT/AV VTI ratio: 0.83 MITRAL VALVE MV Area (PHT): 2.42 cm    SHUNTS MV Decel Time: 313 msec    Systemic VTI: 0.32 m MV E velocity: 57.80 cm/s MV A velocity: 88.30 cm/s MV E/A ratio:  0.65 Jenkins Rouge MD Electronically signed by Jenkins Rouge MD Signature Date/Time: 02/04/2020/1:40:08 PM    Final     Cardiac Studies   Echo 02/04/2020-EF 55 to 60%  Patient Profile     64 y.o. female admitted on 4/26 with Covid pneumonia, hypoxia, sepsis with underlying diabetes morbid obesity with elevated troponin.   Assessment & Plan    Significant bradycardia -Agree that this is likely result of her ongoing acidemia/metabolic acidosis with underlying sepsis.  Also, temperature decreased significantly to 93 and once warmed up, heart rate followed.  Pacer pads were placed for potential backup.  IV dopamine was also utilized. -Repeat echocardiogram does show continued preserved ejection fraction.  There are no gross wall motion abnormalities present. -Initial consultation was secondary to mild ST elevations noted in the inferior leads.  These have improved.  Bradycardia can be seen in the setting of a proximal RCA lesion that affects the sinus node/AV node.  Current EKG demonstrates sinus bradycardia.  No evidence of heart block or AV nodal involvement. -Would plan to continue with argatroban for today and then stop tomorrow after a total of greater than 48 hours of use.  Of course please stop if  bleeding occurs. -Argatroban was being utilized because of possible HIT.  Continue to monitor for signs of bleeding.  Platelet count has worsened down to 35,000, could be sepsis mediated as well.  Septic shock -Normal overall ejection fraction on echocardiogram performed yesterday.  Thrombocytopenia likely related to DIC-schistocytes noted -HIT panel pending.  On argatroban given recent subtle ST segment changes on inferior leads of ECG.  Elevated troponin -Most recently checked was flat at 269, overall mildly elevated.  Likely demand ischemia in the setting of her underlying sepsis.  ST segment subtle elevations noted inferior leads originally, if RCA occlusion were present, we would expect troponin elevations to be in the thousands.  It is possible that subtle ST elevations were a result of focal inflammatory response in the inferior region of her myocardium as it relates to her underlying ARDS. -Continuing with argatroban Plavix and aspirin as  long as bleeding does not occur.  CRITICAL CARE Performed by: Candee Furbish   Total critical care time: 35 minutes  Critical care time was exclusive of separately billable procedures and treating other patients.  Critical care was necessary to treat or prevent imminent or life-threatening deterioration.  Critical care was time spent personally by me on the following activities: development of treatment plan with patient and/or surrogate as well as nursing, discussions with consultants, evaluation of patient's response to treatment, examination of patient, obtaining history from patient or surrogate, ordering and performing treatments and interventions, ordering and review of laboratory studies, ordering and review of radiographic studies, pulse oximetry and re-evaluation of patient's condition.      For questions or updates, please contact [facility] Please consult [email or web address] for contact info under        Signed, Candee Furbish, MD   02/05/2020, 8:32 AM

## 2020-02-05 NOTE — Progress Notes (Signed)
eLink Physician-Brief Progress Note Patient Name: [name] DOB: [birth year] MRN: [id number]   Date of Service  02/05/2020  HPI/Events of Note  Pt with heart rate sustaining in the 30's, BP remains in acceptable range.  eICU Interventions  Will place pacing pads on patient as a precaution, start a low dose Dopamine infusion  (0-5 mcg) PRN heart rate < 45.        Ewa Hipp U Osa Fogarty 02/05/2020, 1:10 AM

## 2020-02-05 NOTE — Progress Notes (Addendum)
Pharmacy Antibiotic Note  [name] is a 64 y.o. female admitted on 12/22/2019 with bacteremia.  Pharmacy has been consulted for vancomycin dosing.  Having increasing worsening hypoxia - on CRRT. Recent COVID. WBC 16.5, temp ~36. Starting on vancomycin/cefepime.    Plan: Cefepime 2g IV every 12 hours Vancomycin 2g IV once then 1250 mg IV every 24 hours  Monitor CRRT, cx results, clinical pic  Height: 5\' 3"  (160 cm) Weight: 105 kg (231 lb 7.7 oz) IBW/kg (Calculated) : 52.4  Temp (24hrs), Avg:96 F (35.6 C), Min:93.2 F (34 C), Max:97.5 F (36.4 C)  Recent Labs  Lab 02/02/20 0415 02/02/20 1602 02/03/20 0423 02/03/20 0423 02/03/20 1556 02/04/20 0037 02/04/20 0437 02/04/20 1521 02/05/20 0434 02/05/20 1523  WBC 19.5*  --  15.2*  --   --  15.3* 15.0*  --  16.5*  --   CREATININE 3.34*   < > 1.78*   < > 1.44*  --  1.47* 1.36* 1.20* 1.03*   < > = values in this interval not displayed.    Estimated Creatinine Clearance: 64.8 mL/min (A) (by C-G formula based on SCr of 1.03 mg/dL (H)).    Allergies  Allergen Reactions  . Heparin     Possible HIT?  Marland Kitchen Pork Allergy   . Pork-Derived Products Nausea And Vomiting    Antimicrobials this admission: Vanc x 1 on 4/27, 5/12x1, 5/20>> Linezolid 5/12>>5/14 Cefepime x 1 4/27, 5/12>>5/18, 5/20>> Valtrex 1000 TID 4/27 >> 5/1  Dose adjustments this admission: N/A  Microbiology results: 4/27 COVID+ 4/28 BCx - negative 5/12 MRSA PCR - negative 5/12 BCx - negative 5/12 TA - negative 5/14 TA - C.albicans 5/20 BCx - sent   Thank you for allowing pharmacy to be a part of this patient's care.  Antonietta Jewel, PharmD, BCCCP Clinical Pharmacist  02/05/2020 11:59 PM  Please check AMION for all [facility] phone numbers After 10:00 PM, call [facility] [phone]

## 2020-02-05 NOTE — Progress Notes (Signed)
eLink Physician-Brief Progress Note Patient Name: Nataliyah Laszewski DOB: 16-Nov-1955 MRN: [id number]   Date of Service  02/05/2020  HPI/Events of Note  Review of CXR --> A small, stable right-sided pneumothorax is seen. This is not changed from a film earlier today.  eICU Interventions  Continue present management.      Intervention Category Major Interventions: Other:;Hypoxemia - evaluation and management  Jeanmarie Mccowen Cornelia Copa 02/05/2020, 11:55 PM

## 2020-02-05 NOTE — Progress Notes (Signed)
Time Progress Note Patient Name: Kenesha Kuechle DOB: 1956-07-08 MRN: [id number]   Date of Service  02/05/2020  HPI/Events of Note  Hypoxia - ABG on 100%/PC 30/Rate 36/P 12 - 7.16/65/46/24. We are at the limits of supporting the patient on mechanical ventilation. She did have small pneumothorax on CXR today and Hypotension - BP = 75/35 with MAP = 45.   eICU Interventions  Will order: 1. Portable CXR STAT. 2. NaHCO3 100 meq IV now. 3. Increase NaHCO3 IV infusion to 125 mL/hour. 4. Increase ceiling on Norepinephrine IV infusion to 70 mcg/min.      Intervention Category Major Interventions: Hypoxemia - evaluation and management;Hypotension - evaluation and management  Lysle Dingwall 02/05/2020, 11:31 PM

## 2020-02-05 NOTE — Progress Notes (Signed)
[facility] Progress Note      Assessment/ Plan:   1. Acute renal failure, oliguric AKI: Most likely ATN in the setting of shock with diminishing urine output and subsequent electrolyte derangements. Also with volume overload and increasing oxygen requirement. CRRT initiated 5/16 (no issues w/ filter clotting)  1. ContinueCRRT (5/16- ...) 1. Continue  2Kfor at least another 24 hrs 2. 0-50 ml/hr  ultrafiltration on Levophed 2. Urinalysisnot active -> likely ATN 3. Anuric. 4. Avoid nephrotoxic agents if possible 5. Significant component of resp acidosis but RR already in 30's.  2. Hypoxic respiratory failure: Associated with Covid pneumonia possibly bacterial superinfection now with volume overload and CHF contributing 1. Ventilation per primary team 3. Hyperkalemia:had to change back to 2K bath on 5/19 4. Hyperphosphatemia: Level 7.2 Associated with kidney injury. Likely will improve with CRRT 5. Covid infection: Status post topical treatments with some improvement now with worsening respiratory status. Primary team managing. 6. Hypoalbuminemia: Likely associated with acute illness and malnutrition. Could make volume removal difficult. 7. Shock: Multifactorial with medication induced (propofol) and sepsis contributing. On norepinephrine.   8. Respiratory acidosis:Onlypartial metabolic compensation likely associated with kidney injury.   9. Diabetes mellitus type 2: With hyperglycemia, uncontrolled. Primary team to manage. 10. Hyperlipidemia: Atorvastatin 40 mg daily, aspirin 81 mg daily.  Subjective:   NSTEMI overnight 5/18-5/19 Hyperkalemic has resolved Acidotic.   Objective:   BP 130/76   Pulse (!) 52   Temp (!) 93.2 F (34 C) (Axillary) Comment: RN made aware  Resp (!) 36   Ht 5' 3" (1.6 m)   Wt 105 kg   LMP 02/16/2017 (Within Weeks) Comment: tubal ligation  SpO2 97%   BMI 41.01 kg/m   Intake/Output Summary (Last 24 hours) at  02/05/2020 9937 Last data filed at 02/05/2020 0900 Gross per 24 hour  Intake 2591.01 ml  Output 3548 ml  Net -956.99 ml   Weight change: -3.2 kg  Physical Exam: GEN:Ill-appearing, lying in bed, ventilated EYES: no scleral icterus,eyes closed with mild periorbital edema CV:RRR, no obvious murmurs PULM:Bilateral chest rise, ventilated, coarse breath sounds bilaterally JIR:CVELF sounds present, nondistended SKIN: no rashes or jaundice EXT:1+ pitting edema in the bilateral lower extremities, warm and well perfused ACCESS: RIJ temp  Imaging: DG Chest Port 1 View  Result Date: 02/05/2020 CLINICAL DATA:  ARDS. EXAM: PORTABLE CHEST 1 VIEW COMPARISON:  Multiple previous chest films. The most recent is from yesterday. FINDINGS: The endotracheal tube, feeding tube, right IJ catheter, right PICC line and bilateral chest tubes are stable. Persistent diffuse interstitial and airspace process in the lungs. No large pleural effusions. Small residual right pneumothorax is noted. IMPRESSION: 1. Stable support apparatus. 2. Persistent diffuse interstitial and airspace process. 3. Small residual right pneumothorax. Electronically Signed   By: Marijo Sanes M.D.   On: 02/05/2020 06:36   DG CHEST PORT 1 VIEW  Result Date: 02/04/2020 CLINICAL DATA:  Recent pneumothorax.  Hypoxia.  COVID-19 positive EXAM: PORTABLE CHEST 1 VIEW COMPARISON:  Feb 04, 2020 study obtained earlier in the day FINDINGS: Endotracheal tube tip is 3.6 cm above the carina. Feeding tube tip is below the diaphragm. Right peripherally inserted central catheter tip is in the superior vena cava. Right jugular catheter tip is in the superior vena cava. There is a chest tube on each side. The previously noted pneumothorax on the right is no longer evident. No pneumothorax seen on the left. Patchy mixed interstitial and airspace opacity remain bilaterally without change. Heart is borderline  prominent with pulmonary vascularity normal. No  adenopathy. No bone lesions. Postoperative changes noted in the lower cervical region. IMPRESSION: Tube and catheter positions as described. Currently no pneumothorax appreciable. Patchy interstitial and alveolar opacity bilaterally, likely due to multifocal pneumonia, potentially with a degree of superimposed ARDS and/or edema. More than one of these entities may be present concurrently. Stable cardiac prominence. No adenopathy evident. Electronically Signed   By: Lowella Grip III M.D.   On: 02/04/2020 14:03   DG Chest Port 1 View  Addendum Date: 02/04/2020   ADDENDUM REPORT: 02/04/2020 09:35 ADDENDUM: Critical Value/emergent results were called by telephone at the time of interpretation on 02/04/2020 at 7:50 a.m. to provider Baltazar Apo , who verbally acknowledged these results. Electronically Signed   By: Marin Olp M.D.   On: 02/04/2020 09:35   Result Date: 02/04/2020 CLINICAL DATA:  ARDS. EXAM: PORTABLE CHEST 1 VIEW COMPARISON:  02/03/2020 FINDINGS: Endotracheal tube has tip 2.9 cm above the carina. Enteric tube courses into the region of the stomach and off the film as tip is not visualized. Right IJ central venous catheter unchanged with tip over the SVC. Right-sided PICC line unchanged. Bilateral chest tubes unchanged. Lungs are adequately inflated and demonstrate a new small right-sided pneumothorax. There is persistent mixed interstitial airspace density over the central lungs which may be due to mild edema versus infection. No definite effusion. Cardiomediastinal silhouette and remainder of the exam is unchanged. IMPRESSION: 1.  New small right-sided pneumothorax. 2. Stable bilateral mixed interstitial airspace density which may be due to edema versus infection. Stable cardiomegaly. 3.  Tubes and lines as described. Electronically Signed: By: Marin Olp M.D. On: 02/04/2020 07:47   ECHOCARDIOGRAM LIMITED  Result Date: 02/04/2020    ECHOCARDIOGRAM LIMITED REPORT   Patient Name:   WENDEE HATA Date of Exam: 02/04/2020 Medical Rec #:  [id number]     Height:       63.0 in Accession #:    [id number]    Weight:       233.2 lb Date of Birth:  1956-02-06      BSA:          2.064 m Patient Age:    89 years      BP:           144/42 mmHg Patient Gender: F             HR:           52 bpm. Exam Location:  Inpatient Procedure: 2D Echo Indications:    Acute Cornonary Syndrome I24.9  History:        Patient has prior history of Echocardiogram examinations, most                 recent 01/15/2020. COPD; Risk Factors:Sleep Apnea, Hypertension,                 Morbid obesity, Dyslipidemia and Former Smoker. CKD                 Acute respiratory failure.  Sonographer:    Vikki Ports Turrentine Referring Phys: [id number] Frederik Pear  Sonographer Comments: Covid 19 positive IMPRESSIONS  1. Poorly visualized no definity on vent no obviosu RWMA's . Left ventricular ejection fraction, by estimation, is 55 to 60%. The left ventricle has normal function. Left ventricular endocardial border not optimally defined to evaluate regional wall motion. Left ventricular diastolic parameters are indeterminate.  2. Right ventricular systolic function was not well visualized. The  right ventricular size is not well visualized.  3. Mild mitral valve regurgitation.  4. Not well seen small systolic gradient across valve consider repeat study when patient off vent . The aortic valve was not well visualized. FINDINGS  Left Ventricle: Poorly visualized no definity on vent no obviosu RWMA's. Left ventricular ejection fraction, by estimation, is 55 to 60%. The left ventricle has normal function. Left ventricular endocardial border not optimally defined to evaluate regional wall motion. Right Ventricle: The right ventricular size is not well visualized. Right vetricular wall thickness was not assessed. Right ventricular systolic function was not well visualized. Left Atrium: Left atrial size was normal in size. Right Atrium: Right atrial size was  not well visualized. Pericardium: There is no evidence of pericardial effusion. Mitral Valve: There is moderate thickening of the mitral valve leaflet(s). There is moderate calcification of the mitral valve leaflet(s). Mild mitral annular calcification. Mild mitral valve regurgitation. Tricuspid Valve: The tricuspid valve is not well visualized. Tricuspid valve regurgitation is mild. Aortic Valve: Not well seen small systolic gradient across valve consider repeat study when patient off vent. The aortic valve was not well visualized. . There is mild thickening and mild calcification of the aortic valve. There is mild thickening of the  aortic valve. There is mild calcification of the aortic valve. Aortic valve mean gradient measures 12.0 mmHg. Aortic valve peak gradient measures 22.7 mmHg. Pulmonic Valve: The pulmonic valve was not well visualized. Aorta: The aortic root was not well visualized. IAS/Shunts: The interatrial septum was not well visualized.   Diastology LV e' lateral:   5.11 cm/s LV E/e' lateral: 11.3  AORTIC VALVE AV Vmax:           238.00 cm/s AV Vmean:          160.000 cm/s AV VTI:            0.380 m AV Peak Grad:      22.7 mmHg AV Mean Grad:      12.0 mmHg LVOT Vmax:         207.00 cm/s LVOT Vmean:        150.000 cm/s LVOT VTI:          0.316 m LVOT/AV VTI ratio: 0.83 MITRAL VALVE MV Area (PHT): 2.42 cm    SHUNTS MV Decel Time: 313 msec    Systemic VTI: 0.32 m MV E velocity: 57.80 cm/s MV A velocity: 88.30 cm/s MV E/A ratio:  0.65 Jenkins Rouge MD Electronically signed by Jenkins Rouge MD Signature Date/Time: 02/04/2020/1:40:08 PM    Final     Labs: BMET Recent Labs  Lab 02/01/20 1730 02/01/20 1730 02/02/20 9767 02/02/20 0420 02/02/20 1602 02/02/20 1625 02/03/20 0423 02/03/20 1541 02/03/20 1556 02/04/20 3419 02/04/20 3790 02/04/20 1521 02/05/20 0353 02/05/20 0434 02/05/20 0641  NA 140   < > 137   < > 137   < > 136   < > 137 134* 136 137 133* 136 133*  K 4.8   < > 5.1   < > 4.5    < > 3.7   < > 5.0 5.5* 5.8* 5.3* 4.6 4.3 4.2  CL 95*   < > 99  --  101  --  99  --  103  --  102 101  --  102  --   CO2 29   < > 29  --  28  --  26  --  25  --  23 25  --  24  --  GLUCOSE 317*   < > 307*  --  215*  --  218*  --  169*  --  276* 299*  --  321*  --   BUN 111*   < > 75*  --  51*  --  43*  --  34*  --  36* 34*  --  36*  --   CREATININE 5.02*   < > 3.34*  --  2.35*  --  1.78*  --  1.44*  --  1.47* 1.36*  --  1.20*  --   CALCIUM 7.8*   < > 8.1*  --  8.0*  --  8.3*  --  8.1*  --  8.2* 8.3*  --  8.3*  --   PHOS 6.9*  --  5.8*  --  5.0*  --  3.8  --  4.1  --  4.4 4.2  --   --   --    < > = values in this interval not displayed.   CBC Recent Labs  Lab 01/30/20 0322 01/30/20 0428 01/31/20 0500 02/01/20 1500 02/03/20 0423 02/03/20 0423 02/03/20 1151 02/03/20 1541 02/04/20 0037 02/04/20 0417 02/04/20 0437 02/05/20 0353 02/05/20 0434 02/05/20 0641  WBC 21.7*   < > 20.6*   < > 15.2*  --   --   --  15.3*  --  15.0*  --  16.5*  --   NEUTROABS 20.6*  --  17.7*  --   --   --   --   --  10.2*  --   --   --   --   --   HGB 10.2*   < > 9.9*   < > 9.4*  --   --    < > 9.9*   < > 9.5* 10.2* 9.2* 9.9*  HCT 34.5*   < > 32.1*   < > 30.9*  --   --    < > 32.9*   < > 32.0* 30.0* 30.7* 29.0*  MCV 107.8*   < > 104.6*   < > 105.5*  --   --   --  107.2*  --  107.7*  --  108.9*  --   PLT 90*   < > 67*   < > 43*   < > PLATELET CLUMPS NOTED ON SMEAR, COUNT APPEARS DECREASED  --  49*  --  45*  --  35*  --    < > = values in this interval not displayed.    Medications:    . artificial tears  1 application Both Eyes G6Y  . vitamin C  500 mg Per Tube Daily  . aspirin  81 mg Per Tube Daily  . atorvastatin  40 mg Per Tube Daily  . brimonidine  1 drop Both Eyes BID  . chlorhexidine gluconate (MEDLINE KIT)  15 mL Mouth Rinse BID  . Chlorhexidine Gluconate Cloth  6 each Topical Daily  . clopidogrel  75 mg Per Tube Daily  . dorzolamide-timolol  1 drop Both Eyes BID  . feeding supplement (PRO-STAT  SUGAR FREE 64)  30 mL Per Tube QID  . fentaNYL (SUBLIMAZE) injection  50 mcg Intravenous Once  . insulin aspart  0-20 Units Subcutaneous Q4H  . insulin aspart  6 Units Subcutaneous Q4H  . insulin detemir  20 Units Subcutaneous BID  . latanoprost  1 drop Both Eyes QHS  . mouth rinse  15 mL Mouth Rinse 10 times per day  . pantoprazole sodium  40  mg Per Tube Daily  . prednisoLONE acetate  1 drop Right Eye BID  . sodium chloride flush  10 mL Intracatheter Q8H  . sodium chloride flush  3 mL Intravenous Q12H  . zinc sulfate  220 mg Per Tube Daily      Otelia Santee, MD 02/05/2020, 9:42 AM

## 2020-02-05 NOTE — Progress Notes (Signed)
ANTICOAGULATION CONSULT NOTE - Initial Consult  Pharmacy Consult:  Argatroban Indication:  Rule out HIT in setting of possible DIC  Allergies  Allergen Reactions  . Heparin     Possible HIT?  Marland Kitchen Pork Allergy   . Pork-Derived Products Nausea And Vomiting    Patient Measurements: Height: 5\' 3"  (160 cm) Weight: 105 kg (231 lb 7.7 oz) IBW/kg (Calculated) : 52.4  Vital Signs: Temp: 93.2 F (34 C) (05/20 0313) Temp Source: Axillary (05/20 0313) BP: 111/39 (05/20 0600) Pulse Rate: 56 (05/20 0715)  Labs: Recent Labs     0000 02/03/20 1151 02/03/20 1541 02/04/20 0037 02/04/20 0220 02/04/20 0417 02/04/20 0437 02/04/20 0437 02/04/20 0946 02/04/20 1521 02/05/20 0353 02/05/20 0353 02/05/20 0434 02/05/20 0641  HGB  --   --    < > 9.9*  --    < > 9.5*   < >  --   --  10.2*   < > 9.2* 9.9*  HCT  --   --    < > 32.9*  --    < > 32.0*   < >  --   --  30.0*  --  30.7* 29.0*  PLT  --  PLATELET CLUMPS NOTED ON SMEAR, COUNT APPEARS DECREASED   < > 49*  --   --  45*  --   --   --   --   --  35*  --   APTT   < > 30  --   --   --   --  68*  --  71*  --   --   --  80*  --   LABPROT  --  14.0  --   --   --   --   --   --   --   --   --   --  40.0*  --   INR  --  1.1  --   --   --   --   --   --   --   --   --   --  4.3*  --   CREATININE   < >  --    < >  --   --   --  1.47*  --   --  1.36*  --   --  1.20*  --   TROPONINIHS  --   --   --   --  269*  --  296*  --   --   --   --   --   --   --    < > = values in this interval not displayed.    Estimated Creatinine Clearance: 55.6 mL/min (A) (by C-G formula based on SCr of 1.2 mg/dL (H)).   Medical History: Past Medical History:  Diagnosis Date  . Achilles rupture   . Anxiety   . Arthritis   . Asthma   . Chronic back pain   . Chronic diastolic heart failure ([place]) 11/30/2016  . Chronic headaches   . Chronic leg pain    bilaterally  . CHRONIC OBSTRUCTIVE PULMONARY DISEASE, MODERATE 03/26/2010   Annotation: with moderate restrictive  lung disease as well per PFTs  04/02/07--actual numbers not noted in old records Qualifier: Diagnosis of  By: Amil Amen MD, Benjamine Mola    . Depression   . Diabetes mellitus 1999   T2DM  . Glaucoma   . Gout   . H/O gestational diabetes mellitus, not currently pregnant   . Hyperlipidemia   .  Hypertension   . Morbid obesity ([place])   . Obstructive sleep apnea on CPAP 07/02/2009   Qualifier: Diagnosis of  By: Amil Amen MD, Benjamine Mola    . OSA (obstructive sleep apnea)   . Sciatica     Assessment: 70 YOF started on argatroban overnight for possible ACS and rule out HIT.  Cards thinks elevated troponin is likely due to demand ischemia.  Now with possible DIC.  APTT therapeutic at 60.  Platelet count is 35K - no bleeding reported.  Goal of Therapy:  aPTT 50-90 seconds Monitor platelets by anticoagulation protocol: Yes   Plan:  Continue argatroban at 2 mcg/kg/min Daily aPTT and CBC F/u HIT Ab Monitor closely for s/sx of bleeding  Alanda Slim, PharmD, [facility] Clinical Pharmacist Please see AMION for all Pharmacists' Contact Phone Numbers 02/05/2020, 7:25 AM

## 2020-02-05 NOTE — Progress Notes (Addendum)
NAME:  [name], MRN:  [id number], DOB:  1956-03-25, LOS: 86 ADMISSION DATE:  01/02/2020, CONSULTATION DATE:  5/11 REFERRING MD:  Dr Candiss Norse, CHIEF COMPLAINT:  Hypoxia   Brief History   64 year old female admitted 4/26 for COVID pneumonia and CHF. Initially improved with the usual treatment. Was on room air. 5/8 developed worsening hypoxia that was progressive despite diuresis. As of 5/11 required NRB and HFNC at 35LPM and became lethargic. PCCM consulted.    Past Medical History   has a past medical history of Achilles rupture, Anxiety, Arthritis, Asthma, Chronic back pain, Chronic diastolic heart failure ([place]) (11/30/2016), Chronic headaches, Chronic leg pain, CHRONIC OBSTRUCTIVE PULMONARY DISEASE, MODERATE (03/26/2010), Depression, Diabetes mellitus (1999), Glaucoma, Gout, H/O gestational diabetes mellitus, not currently pregnant, Hyperlipidemia, Hypertension, Morbid obesity ([place]), Obstructive sleep apnea on CPAP (07/02/2009), OSA (obstructive sleep apnea), and Sciatica.   Significant Hospital Events   4/18-4/23>> admit to [facility]-s/p stent to left distal SFA 4/27>> admit to [facility] for hypoxia from Covid/heart failure 4/27>> lower extremity Dopplers negative for DVT. [id number] grade 2 diastolic dysfunction, moderate RV systolic dysfunction.  RVSP 62.6 mmHg. 4/30>> VQ scan-low probability PE 5/5>> improved-on room air 5/8>> worsening hypoxemia-on 15 L of oxygen-with dramatic response to Lasix-down to 4 L 5/10 -5/12 once again requiring oxygen. Had been down to room air prior to this. Oxygen requirements progressed to point where she required intubation. 5/10 weight was up significantly. 106.4 to 109.7 Kg. 5/12 intubated. ABX started. Lasix escalated. LE [place] repeated and was placed on therapeutic LMWH. Post intubation film w/ small pneumomediastinum. Tube feeds started. Pulse steroids started 5/19 >  Argatroban added given low platelets, pending HITT studies.   Consults:   Nephrology Cardiology  Procedures:  5/12 ETT >> RUE PICC 5/12>> BL chest tubes 5/13 >>  Significant Diagnostic Tests:  Lower extremity doppler 4/27 > no evidence of DVT Echo 4/29 > Grade II DD, moderate reduction of RV systolic function.  V/Q 4/30 > No evidence PE US renal 4/30 > normal size kidneys, no hydro.   Micro Data:  COVID pos 4/27  Flu neg 4/27  Blood cx neg 4/28 > 5/12 BC >> neg 5/12 resp >> GPC pairs >> nml flora 5/14 respiratory >> few Candida  Antimicrobials:  Cefepime 5/12 > 5/18 Vancomycin  5/12 >zyvox >>> 5/12 >> 514  Interim history/subjective:   Hypertension overnight, norepinephrine uptitrated and vasopressin added.  Ultrafiltration decreased on CVVH. Question of subtle ST elevations on EKG, cardiology evaluated.  Argatroban added given low platelets, pending HITT studies.  Platelets 45 Remains on pressure control ventilation, FiO2 0.80, PEEP 14 I/O +2.6 L total  Objective   Blood pressure 130/76, pulse (!) 54, temperature (!) 93.2 F (34 C), temperature source Axillary, resp. rate (!) 36, height 5\' 3"  (1.6 m), weight 105 kg, last menstrual period 02/16/2017, SpO2 92 %. CVP:  [10 mmHg-13 mmHg] 12 mmHg  Vent Mode: PCV FiO2 (%):  [70 %-80 %] 80 % Set Rate:  [35 bmp-36 bmp] 36 bmp PEEP:  [10 cmH20-14 cmH20] 12 cmH20 Plateau Pressure:  [40 cmH20-41 cmH20] 41 cmH20   Intake/Output Summary (Last 24 hours) at 02/05/2020 0753 Last data filed at 02/05/2020 0700 Gross per 24 hour  Intake 2765.79 ml  Output 3610 ml  Net -844.21 ml   Filed Weights   02/03/20 1011 02/04/20 0500 02/05/20 0430  Weight: 108.2 kg 105.8 kg 105 kg    Examination: Gen:      No acute distress, obese HEENT:  EOMI, sclera anicteric Neck:     No masses; no thyromegaly, ETT Lungs:    Clear to auscultation bilaterally; normal respiratory effort CV:         Regular rate and rhythm; no murmurs Abd:      + bowel sounds; soft, non-tender; no palpable masses, no distension Ext:    No  edema; adequate peripheral perfusion Skin:      Warm and dry; no rash Neuro: Sedated  Labs/imaging significant for ABG 7.19/68/73/91% Glucose 321, BUN/creatinine 26/1.2 WBC 16.5, INR 4.3 Chest x-ray 5/20-persistent bilateral infiltrates, small residual right apical pneumothorax.   Resolved Hospital Problem list     Assessment & Plan:   ARDS due to COVID 19 pneumonia: positive test 4/27. S/p Remdesivir, decadron, actemra .  Interestingly oxygen requirements initially improved and then worsened>> not typical course for Covid pneumonia but no other cause identified other than progressive CHF Acute on chronic HFpEF  COPD +/- acute exacerbation OSA: non-complaint with CPAP  Plan Continue pressure control ventilation, attempt to target VT 6 cc/kg She has not tolerated PRBC Continue neuromuscular blockers Weaning off Solu-Medrol.  Pneumomediastinum with bilateral pneumothoraces Continue bilateral chest tubes to suction.  Chest x-ray today with small right apical pneumothorax with stable.  Multifactorial shock, likely related to sedation versus sepsis not present on admission.  Possible contribution of cardiogenic component Bradycardia -2/2 propofol Subtle ST elevations, likely demand ischemia in the setting of multiorgan failure Plan Wean pressors as able. Completed cefepime on 5/18 Argatroban added to ASA and Plavix on 5/18 for possible ACS Start bicarb for acidemia.  Acute on CKD stage 3b, oliguric/anuric  baseline cr ~1.5 to 2.5 even dating back 2 yrs ago. Worsening BUN/Cr,  Total body volume overload Hyperkalemia, improved Plan Appreciate nephrology management CVVHD, volume removal will be dictated by hemodynamic stability.  Goal CVP 10-12 Follow BMP, urine output  Uncontrolled DM w/ labile glycemic control Plan Starting insulin drip  Elevated D Dimer: likely due to COVID. VTE essentially ruled out dopplers and VQ scan completed on 4/30 Plan Argatroban added  overnight 5/18 for possible ACS  PAD: s/p recent stent to LLE Plan Aspirin, Plavix Argatroban added as above 5/18  Thrombocytopenia with likely DIC: schistocytes present on peripheral smear Continue to follow CBC, platelets low but stable 45 now on argatroban Okay to continue aspirin and Plavix in absence of any active bleeding HIT panel pending  Best practice:  Diet: start TF Pain/Anxiety/Delirium protocol (if indicated): Propofol/fentanyl, goal RASS -5 VAP protocol (if indicated): Yes DVT prophylaxis: Subcutaneous heparin GI prophylaxis: PPI Glucose control: SSI Mobility: BR Code Status: FULL Family Communication: Updated daughter 5/19. Pending 5/20 Disposition: ICU   The patient is critically ill with multiple organ system failure and requires high complexity decision making for assessment and support, frequent evaluation and titration of therapies, advanced monitoring, review of radiographic studies and interpretation of complex data.   Critical Care Time devoted to patient care services, exclusive of separately billable procedures, described in this note is 35 minutes.   Marshell Garfinkel MD [facility] Please see [email or web address] for pager details.  02/05/2020, 7:53 AM

## 2020-02-06 ENCOUNTER — Inpatient Hospital Stay (HOSPITAL_COMMUNITY): Payer: BC Managed Care – PPO

## 2020-02-06 LAB — CBC
HCT: 27.9 % — ABNORMAL LOW (ref 36.0–46.0)
HCT: 29.7 % — ABNORMAL LOW (ref 36.0–46.0)
Hemoglobin: 8.2 g/dL — ABNORMAL LOW (ref 12.0–15.0)
Hemoglobin: 8.7 g/dL — ABNORMAL LOW (ref 12.0–15.0)
MCH: 32.1 pg (ref 26.0–34.0)
MCH: 32.2 pg (ref 26.0–34.0)
MCHC: 29.3 g/dL — ABNORMAL LOW (ref 30.0–36.0)
MCHC: 29.4 g/dL — ABNORMAL LOW (ref 30.0–36.0)
MCV: 109.4 fL — ABNORMAL HIGH (ref 80.0–100.0)
MCV: 109.6 fL — ABNORMAL HIGH (ref 80.0–100.0)
Platelets: 35 10*3/uL — ABNORMAL LOW (ref 150–400)
Platelets: 37 10*3/uL — ABNORMAL LOW (ref 150–400)
RBC: 2.55 MIL/uL — ABNORMAL LOW (ref 3.87–5.11)
RBC: 2.71 MIL/uL — ABNORMAL LOW (ref 3.87–5.11)
RDW: 22.2 % — ABNORMAL HIGH (ref 11.5–15.5)
RDW: 22.4 % — ABNORMAL HIGH (ref 11.5–15.5)
WBC: 17.3 10*3/uL — ABNORMAL HIGH (ref 4.0–10.5)
WBC: 23.8 10*3/uL — ABNORMAL HIGH (ref 4.0–10.5)
nRBC: 51.9 % — ABNORMAL HIGH (ref 0.0–0.2)
nRBC: 64.9 % — ABNORMAL HIGH (ref 0.0–0.2)

## 2020-02-06 LAB — BASIC METABOLIC PANEL
Anion gap: 14 (ref 5–15)
BUN: 28 mg/dL — ABNORMAL HIGH (ref 8–23)
CO2: 25 mmol/L (ref 22–32)
Calcium: 7.6 mg/dL — ABNORMAL LOW (ref 8.9–10.3)
Chloride: 98 mmol/L (ref 98–111)
Creatinine, Ser: 1.02 mg/dL — ABNORMAL HIGH (ref 0.44–1.00)
GFR calc Af Amer: >60 mL/min (ref >60)
GFR calc non Af Amer: 58 mL/min — ABNORMAL LOW (ref >60)
Glucose, Bld: 154 mg/dL — ABNORMAL HIGH (ref 70–99)
Potassium: 4.4 mmol/L (ref 3.5–5.1)
Sodium: 137 mmol/L (ref 135–145)

## 2020-02-06 LAB — POCT I-STAT 7, (LYTES, BLD GAS, ICA,H+H)
Acid-Base Excess: 3 mmol/L — ABNORMAL HIGH (ref 0.0–2.0)
Acid-Base Excess: 1 mmol/L (ref 0.0–2.0)
Bicarbonate: 32.8 mmol/L — ABNORMAL HIGH (ref 20.0–28.0)
Bicarbonate: 31.5 mmol/L — ABNORMAL HIGH (ref 20.0–28.0)
Calcium, Ion: 1.12 mmol/L — ABNORMAL LOW (ref 1.15–1.40)
Calcium, Ion: 1.03 mmol/L — ABNORMAL LOW (ref 1.15–1.40)
HCT: 25 % — ABNORMAL LOW (ref 36.0–46.0)
HCT: 29 % — ABNORMAL LOW (ref 36.0–46.0)
Hemoglobin: 8.5 g/dL — ABNORMAL LOW (ref 12.0–15.0)
Hemoglobin: 9.9 g/dL — ABNORMAL LOW (ref 12.0–15.0)
O2 Saturation: 60 %
O2 Saturation: 50 %
Patient temperature: 97.5
Patient temperature: 97.5
Potassium: 3.7 mmol/L (ref 3.5–5.1)
Potassium: 3.9 mmol/L (ref 3.5–5.1)
Sodium: 138 mmol/L (ref 135–145)
Sodium: 140 mmol/L (ref 135–145)
TCO2: 35 mmol/L — ABNORMAL HIGH (ref 22–32)
TCO2: 34 mmol/L — ABNORMAL HIGH (ref 22–32)
pCO2 arterial: 84.5 mmHg (ref 32.0–48.0)
pCO2 arterial: 87.7 mmHg (ref 32.0–48.0)
pH, Arterial: 7.194 — CL (ref 7.350–7.450)
pH, Arterial: 7.161 — CL (ref 7.350–7.450)
pO2, Arterial: 39 mmHg — CL (ref 83.0–108.0)
pO2, Arterial: 35 mmHg — CL (ref 83.0–108.0)

## 2020-02-06 LAB — HEPARIN INDUCED PLATELET AB (HIT ANTIBODY): Heparin Induced Plt Ab: 0.18 OD (ref 0.000–0.400)

## 2020-02-06 LAB — GLUCOSE, CAPILLARY
Glucose-Capillary: 145 mg/dL — ABNORMAL HIGH (ref 70–99)
Glucose-Capillary: 105 mg/dL — ABNORMAL HIGH (ref 70–99)

## 2020-02-06 LAB — APTT: aPTT: 75 seconds — ABNORMAL HIGH (ref 24–36)

## 2020-02-06 LAB — PHOSPHORUS: Phosphorus: 4.1 mg/dL (ref 2.5–4.6)

## 2020-02-06 LAB — MAGNESIUM: Magnesium: 2.7 mg/dL — ABNORMAL HIGH (ref 1.7–2.4)

## 2020-02-06 MED ORDER — SODIUM BICARBONATE 8.4 % IV SOLN
INTRAVENOUS | Status: AC
  Administered 2020-02-06: 50 meq
  Filled 2020-02-06: qty 100

## 2020-02-06 MED ORDER — EPINEPHRINE HCL 5 MG/250ML IV SOLN IN NS
INTRAVENOUS | Status: AC
  Administered 2020-02-06: 4 mg
  Filled 2020-02-06: qty 250

## 2020-02-06 MED ORDER — SODIUM BICARBONATE 8.4 % IV SOLN
100.0000 meq | Freq: Once | INTRAVENOUS | Status: AC
  Administered 2020-02-06: 100 meq via INTRAVENOUS
  Filled 2020-02-06: qty 100

## 2020-02-06 MED ORDER — SODIUM BICARBONATE 8.4 % IV SOLN
100.0000 meq | Freq: Once | INTRAVENOUS | Status: AC
  Administered 2020-02-06: 100 meq via INTRAVENOUS

## 2020-02-06 MED ORDER — EPINEPHRINE HCL 5 MG/250ML IV SOLN IN NS
0.5000 ug/min | INTRAVENOUS | Status: DC
  Administered 2020-02-06: 0.5 ug/min via INTRAVENOUS
  Administered 2020-02-06: 19 ug/min via INTRAVENOUS
  Filled 2020-02-06: qty 250

## 2020-02-06 MED ORDER — SODIUM BICARBONATE 8.4 % IV SOLN
100.0000 meq | Freq: Once | INTRAVENOUS | Status: AC
  Administered 2020-02-06: 100 meq via INTRAVENOUS
  Filled 2020-02-06: qty 50

## 2020-02-09 ENCOUNTER — Encounter (HOSPITAL_COMMUNITY): Payer: BC Managed Care – PPO

## 2020-02-09 ENCOUNTER — Ambulatory Visit: Payer: BC Managed Care – PPO

## 2020-02-17 NOTE — Progress Notes (Signed)
  Notes reviewed No new recs at this time Please call if ?Marland Kitchen Will sign off.   Candee Furbish, MD

## 2020-02-17 NOTE — Progress Notes (Signed)
Vent d/c per Dr. Wonda Amis consent and agreement of her daughter as pt in agonal rhythm. Chaplain Genesis at bs for support.

## 2020-02-17 NOTE — Progress Notes (Signed)
Daughter remained with pt post mortem with friends from church. Just leaving. Questions answered and emotional support continued.

## 2020-02-17 NOTE — Progress Notes (Signed)
[facility] Progress Note Patient Name: [name] DOB: 01-14-1956 MRN: [id number]   Date of Service  02-22-2020  HPI/Events of Note  ABG on 100%/PC 30/Rate 36/P 14 = 7.194/84.5/39  eICU Interventions  Will order: 1. NaHCO3 100 meq IV now. 2. Increase NaHCO3 IV infusion rate to 150 mL/hour. 3. Repeat ABG at 6 AM.     Intervention Category Major Interventions: Acid-Base disturbance - evaluation and management;Respiratory failure - evaluation and management  Norah Fick [place] 22-Feb-2020, 4:06 AM

## 2020-02-17 NOTE — Progress Notes (Signed)
[facility] Progress Note Patient Name: Rudell Naumann DOB: 05-18-1956 MRN: [id number]   Date of Service  Mar 07, 2020  HPI/Events of Note  Hypotension - BP = 63/36 with MAP = 43. Norepinephrine IV infusion at 70 mcg/min. Vasopressin IV infusion at shock dose. Last pH = 7.194. CVP = 13-14. Hgb = 8.5.   eICU Interventions  Plan: 1. NaHCO3 100 meq IV now. 2. Epinephrine IV infusion. Titrate to MAP >= 65. 3. Will ask ground team to evaluate the patient at bedside.      Intervention Category Major Interventions: Hypotension - evaluation and management  Francelia Mclaren [place] March 07, 2020, 4:40 AM

## 2020-02-17 NOTE — Progress Notes (Signed)
  Patient with progressive decline overnight with refractory shock, progressive hypoxia and acidosis despite CRRT, bicarb gtt and pushes, and max ventilator support and despite all aggressive measures.   Multiple attempts to contact daughter overnight unsuccessful to update her on patient's decline.  Called 2nd contact, patient's sister, Clarene Critchley.  She lives in [place].  Updated on patient's deteriorating condition.  She will try and reach [facility].

## 2020-02-17 NOTE — Progress Notes (Signed)
Chaplain engaged in initial visit with Tzipa, her Lyndel Safe, daughter, and long-time friend.  Family and friends describe Braxton as putting the needs of others before her, as someone who would give the shirt off her back to another, as thoughtful, and full of honor for those she loves and cares about.  Chaplain offered the ministry of listening and presence as she learned about Nandini.  Daughter noted that she has learned the meaning of unconditional love from her mother.  Daughter has a lot of support from her church family.  Most of Kimya's family is located in [place].  Chaplain prayed with family and friends around [place].  Chaplain is available to follow-up as needed.

## 2020-02-17 NOTE — Progress Notes (Signed)
Pt's vs deteriorating. Daughter at bs w/ support from her and pt's parrish. Have notified chaplain svc of ominous progression and will be at bs soon. Cont to provide emotional support.

## 2020-02-17 NOTE — Progress Notes (Signed)
Chaplain was able to be with daughter in [place] final moments.  Daughter shared pictures and memories with chaplain.  Chaplain provided ministries of presence and listening.  Chaplain is available for follow-up if needed.

## 2020-02-17 NOTE — Progress Notes (Signed)
Pt assessed to be in PEA at 1125 and daughter informed/updated. Cessation of agonal rhythm at 1216 and pronounced death by RN x 2 with verification of no heart rate and no spontaneous respirations: Brigid Re and Engineer, maintenance. Dr. Vaughan Browner updated.

## 2020-02-17 NOTE — Discharge Summary (Signed)
Physician Death Summary  Patient ID: Makella Coller MRN: [id number] DOB/AGE: 63-Jul-1957 63 y.o.  Admit date: 01/02/2020 Discharge date: Feb 12, 2020  Admission Diagnoses:  Discharge Diagnoses:  Principal Problem:   Acute respiratory distress syndrome (ARDS) due to COVID-19 virus [facility]) Active Problems:   Hyperlipidemia   BMI 45.0-49.9, adult (HCC)   Acute on chronic diastolic CHF (congestive heart failure) (HCC)   Acute respiratory failure (HCC)   Elevated troponin   PVD (peripheral vascular disease) (HCC)   Pressure injury of skin   Pneumothorax   AKI (acute kidney injury) [facility])   Discharged Condition: Deceased   Hospital Course:  64 year old female admitted 4/26 for COVID pneumonia and CHF. Initially improved with the usual treatment. Was on room air. 5/8 developed worsening hypoxia that was progressive despite diuresis. As of 5/11 required NRB and HFNC at 35LPM and became lethargic. PCCM consulted and intubated  4/18-4/23>> admit to [facility]-s/p stent to left distal SFA 4/27>> admit to [facility] for hypoxia from Covid/heart failure 4/27>> lower extremity Dopplers negative for DVT. [id number] grade 2 diastolic dysfunction, moderate RV systolic dysfunction.  RVSP 62.6 mmHg. 4/30>> VQ scan-low probability PE 5/5>> improved-on room air 5/8>> worsening hypoxemia-on 15 L of oxygen-with dramatic response to Lasix-down to 4 L 5/10 -5/12 once again requiring oxygen. Had been down to room air prior to this. Oxygen requirements progressed to point where she required intubation. 5/10 weight was up significantly. 106.4 to 109.7 Kg. 5/12 intubated. ABX started. Lasix escalated. LE [place] repeated and was placed on therapeutic LMWH. Post intubation film w/ small pneumomediastinum. Tube feeds started. Pulse steroids started 5/19 >  Argatroban added for ACS, suspicion for HIIT- ab pending   From 5/19 onwards she continued to deteriorate with increasing pressor requirements, acidosis.  She remains sedated, paralyzed  with increasing vent requirements and worsening acidosis on bicarb drip.  Her daughter had a chance to visit.  After family discussions the decision was made to transition to DNR status She continued to deteriorate and passed in the afternoon of 5/21.  Marshell Garfinkel MD [facility] Please see [email or web address] for pager details.  2020-02-12, 6:43 PM

## 2020-02-17 NOTE — Progress Notes (Signed)
Assisted tele visit to patient with family members.  Zehra Rucci Anderson, RN   

## 2020-02-17 NOTE — Progress Notes (Signed)
Called elink in reference to ABG results.  No new orders at this time.  Will continue to monitor closely.

## 2020-02-17 NOTE — Progress Notes (Addendum)
NAME:  [name], MRN:  [id number], DOB:  1955/12/08, LOS: 24 ADMISSION DATE:  12/21/2019, CONSULTATION DATE:  5/11 REFERRING MD:  Dr Candiss Norse, CHIEF COMPLAINT:  Hypoxia   Brief History   64 year old female admitted 4/26 for COVID pneumonia and CHF. Initially improved with the usual treatment. Was on room air. 5/8 developed worsening hypoxia that was progressive despite diuresis. As of 5/11 required NRB and HFNC at 35LPM and became lethargic. PCCM consulted.    Past Medical History   has a past medical history of Achilles rupture, Anxiety, Arthritis, Asthma, Chronic back pain, Chronic diastolic heart failure ([place]) (11/30/2016), Chronic headaches, Chronic leg pain, CHRONIC OBSTRUCTIVE PULMONARY DISEASE, MODERATE (03/26/2010), Depression, Diabetes mellitus (1999), Glaucoma, Gout, H/O gestational diabetes mellitus, not currently pregnant, Hyperlipidemia, Hypertension, Morbid obesity ([place]), Obstructive sleep apnea on CPAP (07/02/2009), OSA (obstructive sleep apnea), and Sciatica.   Significant Hospital Events   4/18-4/23>> admit to [facility]-s/p stent to left distal SFA 4/27>> admit to [facility] for hypoxia from Covid/heart failure 4/27>> lower extremity Dopplers negative for DVT. [id number] grade 2 diastolic dysfunction, moderate RV systolic dysfunction.  RVSP 62.6 mmHg. 4/30>> VQ scan-low probability PE 5/5>> improved-on room air 5/8>> worsening hypoxemia-on 15 L of oxygen-with dramatic response to Lasix-down to 4 L 5/10 -5/12 once again requiring oxygen. Had been down to room air prior to this. Oxygen requirements progressed to point where she required intubation. 5/10 weight was up significantly. 106.4 to 109.7 Kg. 5/12 intubated. ABX started. Lasix escalated. LE [place] repeated and was placed on therapeutic LMWH. Post intubation film w/ small pneumomediastinum. Tube feeds started. Pulse steroids started 5/19 >  Argatroban added for ACS, suspicion for HIIT- ab pending   Consults:   Nephrology Cardiology  Procedures:  5/12 ETT >> RUE PICC 5/12>> BL chest tubes 5/13 >>  Significant Diagnostic Tests:  Lower extremity doppler 4/27 > no evidence of DVT Echo 4/29 > Grade II DD, moderate reduction of RV systolic function.  V/Q 4/30 > No evidence PE US renal 4/30 > normal size kidneys, no hydro.   Micro Data:  COVID pos 4/27  Flu neg 4/27  Blood cx neg 4/28 > 5/12 BC >> neg 5/12 resp >> GPC pairs >> nml flora 5/14 respiratory >> few Candida  Antimicrobials:  Cefepime 5/12 > 5/18 Vancomycin  5/12 >zyvox >>> 5/12 >> 514  Interim history/subjective:   Continues to decline overnight.  On max neo-, vaso-, AP.  Remains sedated, paralyzed with escalating vent requirements.  Worsening acidosis on bicarb.  Objective   Blood pressure (!) 76/42, pulse 85, temperature (!) 97.5 F (36.4 C), temperature source Axillary, resp. rate (!) 36, height 5\' 3"  (1.6 m), weight 107.8 kg, last menstrual period 02/16/2017, SpO2 (!) 75 %. CVP:  [9 mmHg-16 mmHg] 13 mmHg  Vent Mode: PCV FiO2 (%):  [70 %-100 %] 100 % Set Rate:  [36 bmp] 36 bmp PEEP:  [10 cmH20-14 cmH20] 14 cmH20 Plateau Pressure:  [36 cmH20-42 cmH20] 42 cmH20   Intake/Output Summary (Last 24 hours) at 2020-02-15 0808 Last data filed at 2020-02-15 0700 Gross per 24 hour  Intake 6618.73 ml  Output 5714 ml  Net 904.73 ml   Filed Weights   02/04/20 0500 02/05/20 0430 02-15-2020 0500  Weight: 105.8 kg 105 kg 107.8 kg    Examination: Gen:      No acute distress HEENT:  EOMI, sclera anicteric Neck:     No masses; no thyromegaly, ETT Lungs:    Clear to auscultation bilaterally; normal respiratory  effort CV:         Regular rate and rhythm; no murmurs Abd:      + bowel sounds; soft, non-tender; no palpable masses, no distension Ext:    No edema; adequate peripheral perfusion Skin:      Warm and dry; no rash Neuro: Sedated  Labs/imaging significant for BUN/creatinine 28/1.02, mag 2.7 WBC 23.8 Chest x-ray  5/21-bilateral pulmonary opacity, small right apical pneumothorax.  I have reviewed the images personally.  Resolved Hospital Problem list     Assessment & Plan:   ARDS due to COVID 19 pneumonia: positive test 4/27. S/p Remdesivir, decadron, actemra .  Interestingly oxygen requirements initially improved and then worsened>> not typical course for Covid pneumonia but no other cause identified other than progressive CHF Acute on chronic HFpEF  COPD +/- acute exacerbation OSA: non-complaint with CPAP  Plan Continue pressure control ventilation, attempt to target VT 6 cc/kg She has not tolerated PRVC Continue neuromuscular blockers Weaning off Solu-Medrol.  Pneumomediastinum with bilateral pneumothoraces Continue bilateral chest tubes to suction.  Chest x-ray to suction with small right apical pneumothorax  Multifactorial shock, likely related to sedation versus sepsis not present on admission.  Possible contribution of cardiogenic component Bradycardia -2/2 propofol Subtle ST elevations, likely demand ischemia in the setting of multiorgan failure Plan Wean pressors as able. Completed cefepime on 5/18 Argatroban added to ASA and Plavix on 5/18 for possible ACS Bicarb for acidosis  Acute on CKD stage 3b, oliguric/anuric  baseline cr ~1.5 to 2.5 even dating back 2 yrs ago. Worsening BUN/Cr,  Total body volume overload Hyperkalemia, improved Plan Continue CVVH goal CVP 10-12 Follow BMP, urine output  Uncontrolled DM w/ labile glycemic control Plan Insulin drip  Elevated D Dimer: likely due to [place]. VTE essentially ruled out dopplers and VQ scan completed on 4/30 Plan Argatroban added overnight 5/18 for possible ACS.  Will stop today  PAD: s/p recent stent to LLE Plan Aspirin, Plavix  Thrombocytopenia with likely DIC: schistocytes present on peripheral smear Continue to follow CBC, platelets low but stable 45 now on argatroban Okay to continue aspirin and Plavix in  absence of any active bleeding HIT ab are negative.  Goals of care Discussed with daughter at bedside.  Given deterioration in clinical status her prognosis is grim Daughter has agreed to DNR status.  She is aware that patient patient is maximally supported with no possible escalation and may pass away today.  Best practice:  Diet: Tube feeds Pain/Anxiety/Delirium protocol (if indicated): Propofol/fentanyl, goal RASS -5 VAP protocol (if indicated): Yes DVT prophylaxis: Subcutaneous heparin GI prophylaxis: PPI Glucose control: SSI Mobility: BR Code Status: DNR Family Communication: Updated daughter.  See above. Disposition: ICU   The patient is critically ill with multiple organ system failure and requires high complexity decision making for assessment and support, frequent evaluation and titration of therapies, advanced monitoring, review of radiographic studies and interpretation of complex data.   Critical Care Time devoted to patient care services, exclusive of separately billable procedures, described in this note is 35 minutes.   Marshell Garfinkel MD [facility] Please see [email or web address] for pager details.  March 06, 2020, 8:08 AM

## 2020-02-17 NOTE — Progress Notes (Signed)
Received call from daughter Juluis Rainier.  Updated her on current status of patient.  Daughter on her way to hospital.  Will continue to monitor very closely.

## 2020-02-17 NOTE — Progress Notes (Signed)
[facility] Progress Note Patient Name: [name] DOB: [birth year] MRN: [id number]   Date of Service  2020/02/08  HPI/Events of Note  Hypotension - BP = 88/59 with MAP = 70.   eICU Interventions  Will order: 1. NaHCO3 100 meq IV now. 2. Restart Vasopressin IV infusion at shock dose.  3. Repeat ABG at 4 AM.     Intervention Category Major Interventions: Hypotension - evaluation and management  Cayle Thunder [place] 02-08-2020, 3:32 AM

## 2020-02-17 NOTE — Progress Notes (Signed)
[facility] Progress Note     Assessment/ Plan:   1. Acute renal failure, oliguric AKI: Most likely ATN in the setting of shock with diminishing urine output and subsequent electrolyte derangements. Also with volume overload and increasing oxygen requirement. CRRT initiated 5/16 (no issues w/ filter clotting)  (5/16- ...) 1. Urinalysisnot active -> likely ATN 2. Anuric. 3. Avoid nephrotoxic agents if possible 4. Significant component of resp acidosis but RR already in 30's. 5. Severely hypotensive on max 3 pressors -> let's stop the CRRT. Not tolerating at this point. I spoke with the family and they understand + agree. 6. Will sign off at this time; please reconsult as needed.  2. Hypoxic respiratory failure: Associated with Covid pneumonia possibly bacterial superinfection now with volume overload and CHF contributing 1. Ventilation per primary team 3. Hyperkalemia:had to change back to 2K bath on 5/19 4. Hyperphosphatemia: Level 7.2 Associated with kidney injury. Likely will improve with CRRT 5. Covid infection: Status post topical treatments with some improvement now with worsening respiratory status. Primary team managing. 6. Hypoalbuminemia: Likely associated with acute illness and malnutrition. Could make volume removal difficult. 7. Shock: Multifactorial with medication induced (propofol) and sepsis contributing. On norepinephrine.   8. Respiratory acidosis:Onlypartial metabolic compensation likely associated with kidney injury.   9. Diabetes mellitus type 2: With hyperglycemia, uncontrolled. Primary team to manage. 10. Hyperlipidemia: Atorvastatin 40 mg daily, aspirin 81 mg daily.  Subjective:   NSTEMI 5/18-5/19 Hyperkalemic has resolved but becoming more acidotic and difficulty ventilating .   Objective:   BP (!) 76/42   Pulse 85   Temp (!) 97 F (36.1 C) (Oral)   Resp (!) 36   Ht 5' 3" (1.6 m)   Wt 107.8 kg   LMP 02/16/2017 (Within  Weeks) Comment: tubal ligation  SpO2 (!) 80%   BMI 42.10 kg/m   Intake/Output Summary (Last 24 hours) at 02-08-2020 1026 Last data filed at 02/08/2020 0900 Gross per 24 hour  Intake 6924.82 ml  Output 5791 ml  Net 1133.82 ml   Weight change: 2.8 kg  Physical Exam: GEN:Ill-appearing, lying in bed, ventilated CV:RRR, no obvious murmurs PULM:Bilateral chest rise, ventilated, coarse breath sounds bilaterally WYO:VZCHY sounds present, nondistended SKIN: no rashes or jaundice EXT:1-2+ pitting edema in the bilateral lower extremities, warm and well perfused ACCESS: RIJ temp  Imaging: DG Chest Port 1 View  Result Date: 02/08/2020 EXAM: PORTABLE CHEST 1 VIEW COMPARISON:  Yesterday FINDINGS: The endotracheal tube tip is just below the clavicular heads. The feeding tube at least reaches the stomach. Bilateral chest tube and 2 right-sided central lines are in stable position. Small right apical pneumothorax. Diffuse hazy and interstitial opacities. Normal heart size. IMPRESSION: 1. Stable hardware positioning. 2. Small right apical pneumothorax and unchanged extensive pulmonary opacity. Electronically Signed   By: Monte Fantasia M.D.   On: 02-08-2020 06:48   DG CHEST PORT 1 VIEW  Result Date: 02/05/2020 CLINICAL DATA:  Hypoxia. EXAM: PORTABLE CHEST 1 VIEW COMPARISON:  Feb 05, 2020 (4:40 a.m.) FINDINGS: There is stable endotracheal tube, nasogastric tube, right internal jugular venous catheter, right-sided PICC line and bilateral chest tube positioning. Stable increased lung markings are seen within the bilateral lung bases and mid left lung. There is no evidence of a pleural effusion. A small, stable right-sided pneumothorax is seen. The cardiac silhouette is moderately enlarged and unchanged in size. The visualized skeletal structures are unremarkable. Radiopaque surgical clips are seen overlying the right upper quadrant. IMPRESSION: No significant interval change  when compared to the prior  chest plain film dated Feb 05, 2019 (4:40 a.m.). Electronically Signed   By: Virgina Norfolk M.D.   On: 02/05/2020 23:48   DG Chest Port 1 View  Result Date: 02/05/2020 CLINICAL DATA:  ARDS. EXAM: PORTABLE CHEST 1 VIEW COMPARISON:  Multiple previous chest films. The most recent is from yesterday. FINDINGS: The endotracheal tube, feeding tube, right IJ catheter, right PICC line and bilateral chest tubes are stable. Persistent diffuse interstitial and airspace process in the lungs. No large pleural effusions. Small residual right pneumothorax is noted. IMPRESSION: 1. Stable support apparatus. 2. Persistent diffuse interstitial and airspace process. 3. Small residual right pneumothorax. Electronically Signed   By: Marijo Sanes M.D.   On: 02/05/2020 06:36   DG CHEST PORT 1 VIEW  Result Date: 02/04/2020 CLINICAL DATA:  Recent pneumothorax.  Hypoxia.  COVID-19 positive EXAM: PORTABLE CHEST 1 VIEW COMPARISON:  Feb 04, 2020 study obtained earlier in the day FINDINGS: Endotracheal tube tip is 3.6 cm above the carina. Feeding tube tip is below the diaphragm. Right peripherally inserted central catheter tip is in the superior vena cava. Right jugular catheter tip is in the superior vena cava. There is a chest tube on each side. The previously noted pneumothorax on the right is no longer evident. No pneumothorax seen on the left. Patchy mixed interstitial and airspace opacity remain bilaterally without change. Heart is borderline prominent with pulmonary vascularity normal. No adenopathy. No bone lesions. Postoperative changes noted in the lower cervical region. IMPRESSION: Tube and catheter positions as described. Currently no pneumothorax appreciable. Patchy interstitial and alveolar opacity bilaterally, likely due to multifocal pneumonia, potentially with a degree of superimposed ARDS and/or edema. More than one of these entities may be present concurrently. Stable cardiac prominence. No adenopathy evident.  Electronically Signed   By: Lowella Grip III M.D.   On: 02/04/2020 14:03   ECHOCARDIOGRAM LIMITED  Result Date: 02/04/2020    ECHOCARDIOGRAM LIMITED REPORT   Patient Name:   [name] Date of Exam: 02/04/2020 Medical Rec #:  [id number]     Height:       63.0 in Accession #:    [id number]    Weight:       233.2 lb Date of Birth:  [birth year]      BSA:          2.064 m Patient Age:    64 years      BP:           144/42 mmHg Patient Gender: F             HR:           52 bpm. Exam Location:  Inpatient Procedure: 2D Echo Indications:    Acute Cornonary Syndrome I24.9  History:        Patient has prior history of Echocardiogram examinations, most                 recent 01/15/2020. COPD; Risk Factors:Sleep Apnea, Hypertension,                 Morbid obesity, Dyslipidemia and Former Smoker. CKD                 Acute respiratory failure.  Sonographer:    Vikki Ports Turrentine Referring Phys: [id number] Frederik Pear  Sonographer Comments: Covid 19 positive IMPRESSIONS  1. Poorly visualized no definity on vent no obviosu RWMA's . Left ventricular ejection fraction, by estimation, is 55 to 60%.  The left ventricle has normal function. Left ventricular endocardial border not optimally defined to evaluate regional wall motion. Left ventricular diastolic parameters are indeterminate.  2. Right ventricular systolic function was not well visualized. The right ventricular size is not well visualized.  3. Mild mitral valve regurgitation.  4. Not well seen small systolic gradient across valve consider repeat study when patient off vent . The aortic valve was not well visualized. FINDINGS  Left Ventricle: Poorly visualized no definity on vent no obviosu RWMA's. Left ventricular ejection fraction, by estimation, is 55 to 60%. The left ventricle has normal function. Left ventricular endocardial border not optimally defined to evaluate regional wall motion. Right Ventricle: The right ventricular size is not well visualized. Right  vetricular wall thickness was not assessed. Right ventricular systolic function was not well visualized. Left Atrium: Left atrial size was normal in size. Right Atrium: Right atrial size was not well visualized. Pericardium: There is no evidence of pericardial effusion. Mitral Valve: There is moderate thickening of the mitral valve leaflet(s). There is moderate calcification of the mitral valve leaflet(s). Mild mitral annular calcification. Mild mitral valve regurgitation. Tricuspid Valve: The tricuspid valve is not well visualized. Tricuspid valve regurgitation is mild. Aortic Valve: Not well seen small systolic gradient across valve consider repeat study when patient off vent. The aortic valve was not well visualized. . There is mild thickening and mild calcification of the aortic valve. There is mild thickening of the  aortic valve. There is mild calcification of the aortic valve. Aortic valve mean gradient measures 12.0 mmHg. Aortic valve peak gradient measures 22.7 mmHg. Pulmonic Valve: The pulmonic valve was not well visualized. Aorta: The aortic root was not well visualized. IAS/Shunts: The interatrial septum was not well visualized.   Diastology LV e' lateral:   5.11 cm/s LV E/e' lateral: 11.3  AORTIC VALVE AV Vmax:           238.00 cm/s AV Vmean:          160.000 cm/s AV VTI:            0.380 m AV Peak Grad:      22.7 mmHg AV Mean Grad:      12.0 mmHg LVOT Vmax:         207.00 cm/s LVOT Vmean:        150.000 cm/s LVOT VTI:          0.316 m LVOT/AV VTI ratio: 0.83 MITRAL VALVE MV Area (PHT): 2.42 cm    SHUNTS MV Decel Time: 313 msec    Systemic VTI: 0.32 m MV E velocity: 57.80 cm/s MV A velocity: 88.30 cm/s MV E/A ratio:  0.65 Jenkins Rouge MD Electronically signed by Jenkins Rouge MD Signature Date/Time: 02/04/2020/1:40:08 PM    Final     Labs: BMET Recent Labs  Lab 02/02/20 1602 02/02/20 1625 02/03/20 0423 02/03/20 1541 02/03/20 1556 02/04/20 0417 02/04/20 6546 02/04/20 5035 02/04/20 1521  02/05/20 4656 02/05/20 8127 02/05/20 5170 02/05/20 1523 02/05/20 2308 2020/02/26 0329 February 26, 2020 0354 02-26-20 0643  NA 137   < > 136   < > 137   < > 136   < > 137   < > 136 133* 136 135 137 138 140  K 4.5   < > 3.7   < > 5.0   < > 5.8*   < > 5.3*   < > 4.3 4.2 4.4 3.9 4.4 3.7 3.9  CL 101   < > 99  --  103  --  102  --  101  --  102  --  102  --  98  --   --   CO2 28   < > 26  --  25  --  23  --  25  --  24  --  26  --  25  --   --   GLUCOSE 215*   < > 218*  --  169*  --  276*  --  299*  --  321*  --  213*  --  154*  --   --   BUN 51*   < > 43*  --  34*  --  36*  --  34*  --  36*  --  32*  --  28*  --   --   CREATININE 2.35*   < > 1.78*  --  1.44*  --  1.47*  --  1.36*  --  1.20*  --  1.03*  --  1.02*  --   --   CALCIUM 8.0*   < > 8.3*  --  8.1*  --  8.2*  --  8.3*  --  8.3*  --  8.2*  --  7.6*  --   --   PHOS 5.0*  --  3.8  --  4.1  --  4.4  --  4.2  --   --   --  3.1  --  4.1  --   --    < > = values in this interval not displayed.   CBC Recent Labs  Lab 01/31/20 0500 02/01/20 1500 02/04/20 0037 02/04/20 0417 02/04/20 0437 02/05/20 0353 02/05/20 0434 02/05/20 0641 02/05/20 2347 03-04-20 0329 03/04/2020 0354 03/04/2020 0643  WBC 20.6*   < > 15.3*   < > 15.0*  --  16.5*  --  17.3* 23.8*  --   --   NEUTROABS 17.7*  --  10.2*  --   --   --   --   --   --   --   --   --   HGB 9.9*   < > 9.9*   < > 9.5*   < > 9.2*   < > 8.2* 8.7* 8.5* 9.9*  HCT 32.1*   < > 32.9*   < > 32.0*   < > 30.7*   < > 27.9* 29.7* 25.0* 29.0*  MCV 104.6*   < > 107.2*   < > 107.7*  --  108.9*  --  109.4* 109.6*  --   --   PLT 67*   < > 49*   < > 45*  --  35*  --  35* 37*  --   --    < > = values in this interval not displayed.    Medications:    . artificial tears  1 application Both Eyes I7T  . vitamin C  500 mg Per Tube Daily  . aspirin  81 mg Per Tube Daily  . atorvastatin  40 mg Per Tube Daily  . B-complex with vitamin C  1 tablet Per Tube Daily  . brimonidine  1 drop Both Eyes BID  . chlorhexidine  gluconate (MEDLINE KIT)  15 mL Mouth Rinse BID  . Chlorhexidine Gluconate Cloth  6 each Topical Daily  . clopidogrel  75 mg Per Tube Daily  . dorzolamide-timolol  1 drop Both Eyes BID  . feeding supplement (PRO-STAT SUGAR FREE 64)  30 mL Per Tube BID  . fentaNYL (SUBLIMAZE) injection  50 mcg Intravenous Once  . insulin aspart  3-9 Units Subcutaneous Q4H  . insulin aspart  6 Units Subcutaneous Q4H  . insulin detemir  18 Units Subcutaneous Q12H  . latanoprost  1 drop Both Eyes QHS  . mouth rinse  15 mL Mouth Rinse 10 times per day  . pantoprazole sodium  40 mg Per Tube Daily  . prednisoLONE acetate  1 drop Right Eye BID  . sodium chloride flush  10 mL Intracatheter Q8H  . sodium chloride flush  3 mL Intravenous Q12H  . zinc sulfate  220 mg Per Tube Daily      Otelia Santee, MD 02/07/2020, 10:26 AM

## 2020-02-17 NOTE — Progress Notes (Signed)
During the shift, patient became more hypotensive despite increase in Levo and not pulling fluid with CRRT.  Called elink and received orders to increase Bicarb drip, push 2 amps of bicarb, PCXR and ceiling for levo increased to 49mcg.  Dopamine was attempted with no change in vital signs and therefore d/c.  Dr. Guerry Bruin on the floor and assessed patient.  Received orders for CBC and increase PEEP to 14.  Dr. Guerry Bruin attempted to call next of kin with no answer.  Patient currently on 28mcg of levophed with BP of 103/45 (57).  Patient sat is 80% on 100%/14/36, MD aware of sat level.    Will continue to monitor patient very closely.

## 2020-02-17 NOTE — Progress Notes (Signed)
Daughter at bs w/ maternal aunts video'd in. All have been updated to pt's dire condition and emotional support provided. Clergy also at bs.

## 2020-02-17 NOTE — Progress Notes (Signed)
Called elink regarding low BP.  Currently maxed out on Levo at 70 mcg.  Vaso started and 2 amps of bicarb given.  Additional 2 amps of bicarb, bicarb gtt increased to 150 given for ABG results.  Will continue to monitor closely.

## 2020-02-17 NOTE — Progress Notes (Signed)
PCCM Brief Progress Note  Patient has continued to decline overnight. She is on NE 70 Vaso 0.03 and Epi 19. She remains intubated and  chemically paralyzed, unfortunately with worsening oxygenation despite escalating vent support. Worsening acidosis despite bicarb. Remains on CRRT, unable to pull fluid.   BP 72/41 MAP 50 SpO2 44% HR 82.   Discussed ongoing decline with daughter at bedside. Code status discussed and decision made to update to DNR in event of cardiac arrest despite above efforts.    Eliseo Gum MSN, AGACNP-BC [place] OX:[id number] If no answer, RJ:[id number] 03-Mar-2020, 7:42 AM

## 2020-02-17 DEATH — deceased

## 2023-05-03 NOTE — Progress Notes (Signed)
Trim nails  ?
